# Patient Record
Sex: Female | Born: 1960 | Race: Black or African American | Hispanic: No | Marital: Single | Smoking: Former smoker
Health system: Southern US, Community
[De-identification: ages and names within clinical notes are randomized; demographics above are authoritative.]

## PROBLEM LIST (undated history)

## (undated) DIAGNOSIS — I509 Heart failure, unspecified: Secondary | ICD-10-CM

## (undated) DIAGNOSIS — B1920 Unspecified viral hepatitis C without hepatic coma: Secondary | ICD-10-CM

## (undated) DIAGNOSIS — F329 Major depressive disorder, single episode, unspecified: Secondary | ICD-10-CM

## (undated) DIAGNOSIS — J309 Allergic rhinitis, unspecified: Secondary | ICD-10-CM

## (undated) DIAGNOSIS — F32A Depression, unspecified: Secondary | ICD-10-CM

## (undated) DIAGNOSIS — Z9119 Patient's noncompliance with other medical treatment and regimen: Secondary | ICD-10-CM

## (undated) DIAGNOSIS — F419 Anxiety disorder, unspecified: Secondary | ICD-10-CM

## (undated) DIAGNOSIS — Z91199 Patient's noncompliance with other medical treatment and regimen due to unspecified reason: Secondary | ICD-10-CM

## (undated) DIAGNOSIS — F1910 Other psychoactive substance abuse, uncomplicated: Secondary | ICD-10-CM

## (undated) DIAGNOSIS — R630 Anorexia: Secondary | ICD-10-CM

## (undated) DIAGNOSIS — I10 Essential (primary) hypertension: Secondary | ICD-10-CM

## (undated) DIAGNOSIS — M549 Dorsalgia, unspecified: Secondary | ICD-10-CM

## (undated) DIAGNOSIS — I739 Peripheral vascular disease, unspecified: Secondary | ICD-10-CM

## (undated) DIAGNOSIS — B182 Chronic viral hepatitis C: Secondary | ICD-10-CM

## (undated) DIAGNOSIS — G8929 Other chronic pain: Secondary | ICD-10-CM

## (undated) HISTORY — DX: Anorexia: R63.0

## (undated) HISTORY — DX: Peripheral vascular disease, unspecified: I73.9

## (undated) HISTORY — DX: Heart failure, unspecified: I50.9

## (undated) HISTORY — DX: Other psychoactive substance abuse, uncomplicated: F19.10

## (undated) HISTORY — DX: Essential (primary) hypertension: I10

## (undated) HISTORY — DX: Unspecified viral hepatitis C without hepatic coma: B19.20

## (undated) HISTORY — DX: Major depressive disorder, single episode, unspecified: F32.9

## (undated) HISTORY — DX: Depression, unspecified: F32.A

## (undated) HISTORY — DX: Anxiety disorder, unspecified: F41.9

## (undated) HISTORY — DX: Chronic viral hepatitis C: B18.2

---

## 2000-12-05 ENCOUNTER — Encounter: Payer: Self-pay | Admitting: Internal Medicine

## 2000-12-05 ENCOUNTER — Inpatient Hospital Stay (HOSPITAL_COMMUNITY): Admission: EM | Admit: 2000-12-05 | Discharge: 2000-12-07 | Payer: Self-pay | Admitting: Internal Medicine

## 2002-03-23 HISTORY — PX: APPENDECTOMY: SHX54

## 2003-06-06 ENCOUNTER — Emergency Department (HOSPITAL_COMMUNITY): Admission: EM | Admit: 2003-06-06 | Discharge: 2003-06-06 | Payer: Self-pay | Admitting: Emergency Medicine

## 2003-08-16 ENCOUNTER — Ambulatory Visit (HOSPITAL_COMMUNITY): Admission: RE | Admit: 2003-08-16 | Discharge: 2003-08-16 | Payer: Self-pay | Admitting: Family Medicine

## 2004-01-29 ENCOUNTER — Ambulatory Visit: Payer: Self-pay | Admitting: Family Medicine

## 2004-02-19 ENCOUNTER — Ambulatory Visit: Payer: Self-pay | Admitting: Gastroenterology

## 2004-03-11 ENCOUNTER — Ambulatory Visit: Payer: Self-pay | Admitting: Family Medicine

## 2004-06-17 ENCOUNTER — Ambulatory Visit: Payer: Self-pay | Admitting: Family Medicine

## 2004-08-19 ENCOUNTER — Ambulatory Visit: Payer: Self-pay | Admitting: Family Medicine

## 2004-09-16 ENCOUNTER — Ambulatory Visit: Payer: Self-pay | Admitting: Family Medicine

## 2004-10-24 ENCOUNTER — Ambulatory Visit: Payer: Self-pay | Admitting: Family Medicine

## 2004-12-05 ENCOUNTER — Ambulatory Visit: Payer: Self-pay | Admitting: Family Medicine

## 2005-03-02 ENCOUNTER — Ambulatory Visit: Payer: Self-pay | Admitting: Family Medicine

## 2005-05-05 ENCOUNTER — Ambulatory Visit: Payer: Self-pay | Admitting: Family Medicine

## 2005-06-11 ENCOUNTER — Ambulatory Visit: Payer: Self-pay | Admitting: Family Medicine

## 2005-06-25 ENCOUNTER — Ambulatory Visit: Payer: Self-pay | Admitting: Family Medicine

## 2005-07-16 ENCOUNTER — Ambulatory Visit: Payer: Self-pay | Admitting: Family Medicine

## 2005-09-01 ENCOUNTER — Ambulatory Visit: Payer: Self-pay | Admitting: Family Medicine

## 2005-09-03 ENCOUNTER — Ambulatory Visit (HOSPITAL_COMMUNITY): Admission: RE | Admit: 2005-09-03 | Discharge: 2005-09-03 | Payer: Self-pay | Admitting: Family Medicine

## 2005-10-13 ENCOUNTER — Ambulatory Visit: Payer: Self-pay | Admitting: Family Medicine

## 2005-12-11 ENCOUNTER — Ambulatory Visit: Payer: Self-pay | Admitting: Family Medicine

## 2005-12-11 ENCOUNTER — Other Ambulatory Visit: Admission: RE | Admit: 2005-12-11 | Discharge: 2005-12-11 | Payer: Self-pay | Admitting: Family Medicine

## 2005-12-11 ENCOUNTER — Encounter: Payer: Self-pay | Admitting: Family Medicine

## 2005-12-14 ENCOUNTER — Ambulatory Visit (HOSPITAL_COMMUNITY): Admission: RE | Admit: 2005-12-14 | Discharge: 2005-12-14 | Payer: Self-pay | Admitting: Family Medicine

## 2005-12-28 ENCOUNTER — Ambulatory Visit: Payer: Self-pay | Admitting: Family Medicine

## 2006-03-11 ENCOUNTER — Ambulatory Visit: Payer: Self-pay | Admitting: Family Medicine

## 2006-03-25 ENCOUNTER — Ambulatory Visit (HOSPITAL_COMMUNITY): Admission: RE | Admit: 2006-03-25 | Discharge: 2006-03-25 | Payer: Self-pay | Admitting: Family Medicine

## 2006-04-21 ENCOUNTER — Ambulatory Visit: Payer: Self-pay | Admitting: Family Medicine

## 2006-06-02 ENCOUNTER — Ambulatory Visit: Payer: Self-pay | Admitting: Family Medicine

## 2006-08-02 ENCOUNTER — Ambulatory Visit: Payer: Self-pay | Admitting: Family Medicine

## 2006-08-02 LAB — CONVERTED CEMR LAB
BUN: 14 mg/dL (ref 6–23)
Calcium: 9.3 mg/dL (ref 8.4–10.5)
Chloride: 106 meq/L (ref 96–112)
Creatinine, Ser: 0.9 mg/dL (ref 0.40–1.20)
HDL: 51 mg/dL (ref >39)
Hgb A1c MFr Bld: 6.4 % — ABNORMAL HIGH (ref 4.6–6.1)
LDL Cholesterol: 28 mg/dL (ref 0–99)
Triglycerides: 212 mg/dL — ABNORMAL HIGH (ref <150)

## 2006-09-09 ENCOUNTER — Ambulatory Visit (HOSPITAL_COMMUNITY): Admission: RE | Admit: 2006-09-09 | Discharge: 2006-09-09 | Payer: Self-pay | Admitting: Family Medicine

## 2006-09-27 ENCOUNTER — Ambulatory Visit: Payer: Self-pay | Admitting: Family Medicine

## 2006-11-29 ENCOUNTER — Encounter: Payer: Self-pay | Admitting: Family Medicine

## 2006-11-29 ENCOUNTER — Other Ambulatory Visit: Admission: RE | Admit: 2006-11-29 | Discharge: 2006-11-29 | Payer: Self-pay | Admitting: Family Medicine

## 2006-11-29 ENCOUNTER — Ambulatory Visit: Payer: Self-pay | Admitting: Family Medicine

## 2006-11-29 LAB — CONVERTED CEMR LAB: Pap Smear: NORMAL

## 2006-11-30 ENCOUNTER — Encounter: Payer: Self-pay | Admitting: Family Medicine

## 2006-11-30 LAB — CONVERTED CEMR LAB
Chlamydia, DNA Probe: NEGATIVE
Gardnerella vaginalis: NEGATIVE
Trichomonal Vaginitis: NEGATIVE

## 2006-12-10 ENCOUNTER — Ambulatory Visit: Payer: Self-pay | Admitting: Family Medicine

## 2007-03-01 ENCOUNTER — Ambulatory Visit: Payer: Self-pay | Admitting: Family Medicine

## 2007-04-14 ENCOUNTER — Ambulatory Visit: Payer: Self-pay | Admitting: Family Medicine

## 2007-06-02 ENCOUNTER — Encounter: Payer: Self-pay | Admitting: Family Medicine

## 2007-06-02 DIAGNOSIS — E119 Type 2 diabetes mellitus without complications: Secondary | ICD-10-CM

## 2007-06-02 DIAGNOSIS — B1710 Acute hepatitis C without hepatic coma: Secondary | ICD-10-CM | POA: Insufficient documentation

## 2007-06-02 DIAGNOSIS — Z9189 Other specified personal risk factors, not elsewhere classified: Secondary | ICD-10-CM | POA: Insufficient documentation

## 2007-06-02 DIAGNOSIS — E1165 Type 2 diabetes mellitus with hyperglycemia: Secondary | ICD-10-CM | POA: Insufficient documentation

## 2007-06-02 DIAGNOSIS — F411 Generalized anxiety disorder: Secondary | ICD-10-CM | POA: Insufficient documentation

## 2007-06-02 DIAGNOSIS — F329 Major depressive disorder, single episode, unspecified: Secondary | ICD-10-CM | POA: Insufficient documentation

## 2007-06-02 DIAGNOSIS — F1010 Alcohol abuse, uncomplicated: Secondary | ICD-10-CM | POA: Insufficient documentation

## 2007-06-10 ENCOUNTER — Ambulatory Visit: Payer: Self-pay | Admitting: Family Medicine

## 2007-06-10 LAB — CONVERTED CEMR LAB
Basophils Absolute: 0 10*3/uL (ref 0.0–0.1)
Basophils Relative: 1 % (ref 0–1)
Calcium: 8.9 mg/dL (ref 8.4–10.5)
Eosinophils Absolute: 0.1 10*3/uL (ref 0.0–0.7)
Eosinophils Relative: 3 % (ref 0–5)
HCT: 40.7 % (ref 36.0–46.0)
MCV: 96 fL (ref 78.0–100.0)
Neutrophils Relative %: 30 % — ABNORMAL LOW (ref 43–77)
Platelets: 143 10*3/uL — ABNORMAL LOW (ref 150–400)
Potassium: 4.6 meq/L (ref 3.5–5.3)
RDW: 12.3 % (ref 11.5–15.5)
Sodium: 139 meq/L (ref 135–145)
WBC: 3.4 10*3/uL — ABNORMAL LOW (ref 4.0–10.5)

## 2007-09-16 ENCOUNTER — Ambulatory Visit: Payer: Self-pay | Admitting: Family Medicine

## 2007-09-16 LAB — CONVERTED CEMR LAB
BUN: 12 mg/dL (ref 6–23)
CO2: 25 meq/L (ref 19–32)
Calcium: 8.6 mg/dL (ref 8.4–10.5)
Eosinophils Relative: 2 % (ref 0–5)
Glucose, Bld: 134 mg/dL — ABNORMAL HIGH (ref 70–99)
HCT: 44.1 % (ref 36.0–46.0)
Hemoglobin: 14.3 g/dL (ref 12.0–15.0)
Lymphocytes Relative: 61 % — ABNORMAL HIGH (ref 12–46)
Lymphs Abs: 2 10*3/uL (ref 0.7–4.0)
MCV: 96.5 fL (ref 78.0–100.0)
Microalb, Ur: 0.3 mg/dL (ref 0.00–1.89)
Monocytes Absolute: 0.3 10*3/uL (ref 0.1–1.0)
Monocytes Relative: 9 % (ref 3–12)
Platelets: 138 10*3/uL — ABNORMAL LOW (ref 150–400)
RBC: 4.57 M/uL (ref 3.87–5.11)
Sodium: 141 meq/L (ref 135–145)
TSH: 1.172 microintl units/mL (ref 0.350–5.50)
Total CHOL/HDL Ratio: 2.3
WBC: 3.3 10*3/uL — ABNORMAL LOW (ref 4.0–10.5)

## 2007-09-22 ENCOUNTER — Ambulatory Visit (HOSPITAL_COMMUNITY): Admission: RE | Admit: 2007-09-22 | Discharge: 2007-09-22 | Payer: Self-pay | Admitting: Family Medicine

## 2008-01-04 ENCOUNTER — Other Ambulatory Visit: Admission: RE | Admit: 2008-01-04 | Discharge: 2008-01-04 | Payer: Self-pay | Admitting: Family Medicine

## 2008-01-04 ENCOUNTER — Ambulatory Visit: Payer: Self-pay | Admitting: Family Medicine

## 2008-01-04 ENCOUNTER — Encounter: Payer: Self-pay | Admitting: Family Medicine

## 2008-01-04 LAB — CONVERTED CEMR LAB: Hgb A1c MFr Bld: 6.3 %

## 2008-04-04 ENCOUNTER — Ambulatory Visit: Payer: Self-pay | Admitting: Family Medicine

## 2008-04-04 LAB — CONVERTED CEMR LAB: Glucose, Bld: 161 mg/dL

## 2008-05-24 ENCOUNTER — Encounter: Payer: Self-pay | Admitting: Family Medicine

## 2008-06-08 ENCOUNTER — Encounter: Payer: Self-pay | Admitting: Family Medicine

## 2008-06-19 ENCOUNTER — Telehealth: Payer: Self-pay | Admitting: Family Medicine

## 2008-07-16 ENCOUNTER — Encounter: Payer: Self-pay | Admitting: Family Medicine

## 2008-07-20 ENCOUNTER — Ambulatory Visit: Payer: Self-pay | Admitting: Family Medicine

## 2008-07-20 LAB — CONVERTED CEMR LAB
CO2: 24 meq/L (ref 19–32)
Calcium: 9 mg/dL (ref 8.4–10.5)
Glucose, Bld: 196 mg/dL — ABNORMAL HIGH (ref 70–99)
Potassium: 4.6 meq/L (ref 3.5–5.3)
Sodium: 138 meq/L (ref 135–145)

## 2008-07-23 ENCOUNTER — Telehealth: Payer: Self-pay | Admitting: Family Medicine

## 2008-07-24 ENCOUNTER — Encounter: Payer: Self-pay | Admitting: Family Medicine

## 2008-07-31 ENCOUNTER — Encounter: Payer: Self-pay | Admitting: Family Medicine

## 2008-09-06 ENCOUNTER — Ambulatory Visit: Payer: Self-pay | Admitting: Family Medicine

## 2008-09-07 ENCOUNTER — Encounter: Payer: Self-pay | Admitting: Family Medicine

## 2008-09-14 ENCOUNTER — Encounter: Payer: Self-pay | Admitting: Family Medicine

## 2008-09-14 LAB — CONVERTED CEMR LAB
ALT: 69 units/L — ABNORMAL HIGH (ref 0–35)
AST: 53 units/L — ABNORMAL HIGH (ref 0–37)
BUN: 12 mg/dL (ref 6–23)
Basophils Absolute: 0 10*3/uL (ref 0.0–0.1)
Basophils Relative: 0 % (ref 0–1)
Bilirubin, Direct: 0.4 mg/dL — ABNORMAL HIGH (ref 0.0–0.3)
Calcium: 9.1 mg/dL (ref 8.4–10.5)
Cholesterol: 95 mg/dL (ref 0–200)
Eosinophils Relative: 2 % (ref 0–5)
Glucose, Bld: 152 mg/dL — ABNORMAL HIGH (ref 70–99)
HCT: 40.1 % (ref 36.0–46.0)
Hemoglobin: 13.9 g/dL (ref 12.0–15.0)
Indirect Bilirubin: 0.5 mg/dL (ref 0.0–0.9)
MCHC: 34.7 g/dL (ref 30.0–36.0)
Monocytes Absolute: 0.5 10*3/uL (ref 0.1–1.0)
RDW: 12.6 % (ref 11.5–15.5)
VLDL: 35 mg/dL (ref 0–40)

## 2008-10-02 ENCOUNTER — Ambulatory Visit (HOSPITAL_COMMUNITY): Admission: RE | Admit: 2008-10-02 | Discharge: 2008-10-02 | Payer: Self-pay | Admitting: Family Medicine

## 2008-11-12 ENCOUNTER — Ambulatory Visit: Payer: Self-pay | Admitting: Family Medicine

## 2008-11-12 LAB — CONVERTED CEMR LAB: Glucose, Bld: 378 mg/dL

## 2008-11-16 ENCOUNTER — Telehealth: Payer: Self-pay | Admitting: Family Medicine

## 2008-11-16 ENCOUNTER — Emergency Department (HOSPITAL_COMMUNITY): Admission: EM | Admit: 2008-11-16 | Discharge: 2008-11-16 | Payer: Self-pay | Admitting: Emergency Medicine

## 2008-11-19 ENCOUNTER — Ambulatory Visit: Payer: Self-pay | Admitting: Family Medicine

## 2008-11-19 LAB — CONVERTED CEMR LAB: Blood Glucose, Fasting: 312 mg/dL

## 2008-12-04 ENCOUNTER — Ambulatory Visit: Payer: Self-pay | Admitting: Family Medicine

## 2008-12-05 ENCOUNTER — Telehealth: Payer: Self-pay | Admitting: Family Medicine

## 2008-12-05 ENCOUNTER — Encounter: Payer: Self-pay | Admitting: Family Medicine

## 2008-12-05 LAB — CONVERTED CEMR LAB: Creatinine, Urine: 176.7 mg/dL

## 2009-01-08 ENCOUNTER — Ambulatory Visit: Payer: Self-pay | Admitting: Family Medicine

## 2009-02-07 ENCOUNTER — Ambulatory Visit: Payer: Self-pay | Admitting: Family Medicine

## 2009-02-07 LAB — CONVERTED CEMR LAB: Hgb A1c MFr Bld: 7.5 %

## 2009-03-12 ENCOUNTER — Other Ambulatory Visit: Admission: RE | Admit: 2009-03-12 | Discharge: 2009-03-12 | Payer: Self-pay | Admitting: Family Medicine

## 2009-03-12 ENCOUNTER — Ambulatory Visit: Payer: Self-pay | Admitting: Family Medicine

## 2009-03-12 LAB — CONVERTED CEMR LAB: OCCULT 1: NEGATIVE

## 2009-03-13 ENCOUNTER — Encounter: Payer: Self-pay | Admitting: Family Medicine

## 2009-03-13 LAB — CONVERTED CEMR LAB
Chlamydia, DNA Probe: NEGATIVE
GC Probe Amp, Genital: NEGATIVE

## 2009-04-29 ENCOUNTER — Ambulatory Visit: Payer: Self-pay | Admitting: Family Medicine

## 2009-04-29 LAB — CONVERTED CEMR LAB: Blood Glucose, Fasting: 234 mg/dL

## 2009-08-06 ENCOUNTER — Ambulatory Visit: Payer: Self-pay | Admitting: Family Medicine

## 2009-08-09 LAB — CONVERTED CEMR LAB
BUN: 11 mg/dL (ref 6–23)
CO2: 24 meq/L (ref 19–32)
Chloride: 106 meq/L (ref 96–112)
Glucose, Bld: 133 mg/dL — ABNORMAL HIGH (ref 70–99)
Hgb A1c MFr Bld: 6.8 % — ABNORMAL HIGH (ref <5.7)
Potassium: 4.2 meq/L (ref 3.5–5.3)

## 2009-08-19 DIAGNOSIS — F5105 Insomnia due to other mental disorder: Secondary | ICD-10-CM

## 2009-08-19 DIAGNOSIS — F419 Anxiety disorder, unspecified: Secondary | ICD-10-CM | POA: Insufficient documentation

## 2009-10-14 ENCOUNTER — Ambulatory Visit (HOSPITAL_COMMUNITY): Admission: RE | Admit: 2009-10-14 | Discharge: 2009-10-14 | Payer: Self-pay | Admitting: Family Medicine

## 2009-10-29 ENCOUNTER — Encounter: Payer: Self-pay | Admitting: Family Medicine

## 2009-11-04 ENCOUNTER — Telehealth: Payer: Self-pay | Admitting: Family Medicine

## 2009-11-06 ENCOUNTER — Ambulatory Visit: Payer: Self-pay | Admitting: Family Medicine

## 2009-11-06 DIAGNOSIS — R5383 Other fatigue: Secondary | ICD-10-CM

## 2009-11-06 DIAGNOSIS — R5381 Other malaise: Secondary | ICD-10-CM | POA: Insufficient documentation

## 2010-01-03 ENCOUNTER — Encounter: Payer: Self-pay | Admitting: Family Medicine

## 2010-01-21 ENCOUNTER — Telehealth: Payer: Self-pay | Admitting: Family Medicine

## 2010-02-03 ENCOUNTER — Encounter: Payer: Self-pay | Admitting: Family Medicine

## 2010-03-03 ENCOUNTER — Encounter: Payer: Self-pay | Admitting: Family Medicine

## 2010-03-04 LAB — CONVERTED CEMR LAB
ALT: 111 units/L — ABNORMAL HIGH (ref 0–35)
AST: 131 units/L — ABNORMAL HIGH (ref 0–37)
Albumin: 4 g/dL (ref 3.5–5.2)
Alkaline Phosphatase: 107 units/L (ref 39–117)
BUN: 11 mg/dL (ref 6–23)
Basophils Absolute: 0.1 10*3/uL (ref 0.0–0.1)
Basophils Relative: 2 % — ABNORMAL HIGH (ref 0–1)
Bilirubin, Direct: 0.4 mg/dL — ABNORMAL HIGH (ref 0.0–0.3)
CO2: 26 meq/L (ref 19–32)
Calcium: 9.5 mg/dL (ref 8.4–10.5)
Chloride: 103 meq/L (ref 96–112)
Cholesterol: 92 mg/dL (ref 0–200)
Creatinine, Ser: 0.73 mg/dL (ref 0.40–1.20)
Eosinophils Absolute: 0.1 10*3/uL (ref 0.0–0.7)
Eosinophils Relative: 1 % (ref 0–5)
Glucose, Bld: 210 mg/dL — ABNORMAL HIGH (ref 70–99)
HCT: 41 % (ref 36.0–46.0)
HDL: 35 mg/dL — ABNORMAL LOW (ref >39)
Hemoglobin: 14.2 g/dL (ref 12.0–15.0)
Hgb A1c MFr Bld: 8.9 % — ABNORMAL HIGH (ref <5.7)
Indirect Bilirubin: 0.5 mg/dL (ref 0.0–0.9)
LDL Cholesterol: 25 mg/dL (ref 0–99)
Lymphocytes Relative: 56 % — ABNORMAL HIGH (ref 12–46)
Lymphs Abs: 2.9 10*3/uL (ref 0.7–4.0)
MCHC: 34.6 g/dL (ref 30.0–36.0)
MCV: 92.1 fL (ref 78.0–100.0)
Monocytes Absolute: 0.6 10*3/uL (ref 0.1–1.0)
Monocytes Relative: 12 % (ref 3–12)
Neutro Abs: 1.5 10*3/uL — ABNORMAL LOW (ref 1.7–7.7)
Neutrophils Relative %: 29 % — ABNORMAL LOW (ref 43–77)
Platelets: 121 10*3/uL — ABNORMAL LOW (ref 150–400)
Potassium: 4.3 meq/L (ref 3.5–5.3)
RBC: 4.45 M/uL (ref 3.87–5.11)
RDW: 11.7 % (ref 11.5–15.5)
Sodium: 139 meq/L (ref 135–145)
Total Bilirubin: 0.9 mg/dL (ref 0.3–1.2)
Total CHOL/HDL Ratio: 2.6
Total Protein: 7.5 g/dL (ref 6.0–8.3)
Triglycerides: 162 mg/dL — ABNORMAL HIGH (ref <150)
VLDL: 32 mg/dL (ref 0–40)
WBC: 5.2 10*3/uL (ref 4.0–10.5)

## 2010-03-19 ENCOUNTER — Ambulatory Visit: Payer: Self-pay | Admitting: Family Medicine

## 2010-03-22 DIAGNOSIS — F1920 Other psychoactive substance dependence, uncomplicated: Secondary | ICD-10-CM | POA: Insufficient documentation

## 2010-03-26 ENCOUNTER — Encounter: Payer: Self-pay | Admitting: Family Medicine

## 2010-04-13 ENCOUNTER — Encounter: Payer: Self-pay | Admitting: Family Medicine

## 2010-04-14 ENCOUNTER — Encounter: Payer: Self-pay | Admitting: Family Medicine

## 2010-04-22 NOTE — Letter (Signed)
Summary: ccme  ccme   Imported By: Dierdre Harness 02/03/2010 10:47:36  _____________________________________________________________________  External Attachment:    Type:   Image     Comment:   External Document

## 2010-04-22 NOTE — Letter (Signed)
Summary: MEDICAL NECESSITY  MEDICAL NECESSITY   Imported By: Dierdre Harness 08/06/2009 09:59:49  _____________________________________________________________________  External Attachment:    Type:   Image     Comment:   External Document

## 2010-04-22 NOTE — Assessment & Plan Note (Signed)
Summary: office visit   Vital Signs:  Patient profile:   50 year old female Menstrual status:  irregular Height:      67 inches Weight:      149 pounds BMI:     23.42 O2 Sat:      97 % Pulse rate:   82 / minute Resp:     16 per minute BP sitting:   120 / 90  (left arm) Cuff size:   regular  Vitals Entered By: Kate Sable LPN (August 17, 624THL 9:57 AM) CC: Follow up chronic problems, has some dry itchy places on her arms   Primary Care Provider:  Tula Nakayama MD  CC:  Follow up chronic problems and has some dry itchy places on her arms.  History of Present Illness: Reports  that she has not been doing well. She is experiencing increased depression, states she "misses her twin". Reports since twin has a new boyfriend she is not as involved with her and this is depressing, crying and tearful Denies recent fever or chills. Denies sinus pressure, nasal congestion , ear pain or sore throat. Denies chest congestion, or cough productive of sputum. Denies chest pain, palpitations, PND, orthopnea or leg swelling. Denies abdominal pain, nausea, vomitting, diarrhea or constipation. Denies change in bowel movements or bloody stool. Denies dysuria , frequency, incontinence or hesitancy. Denies  joint pain, swelling, or reduced mobility. Denies headaches, vertigo, seizures.    Allergies (verified): 1)  ! Pcn  Review of Systems Derm:  Complains of itching and rash; dry scaly areas on arms x 1 week, no eryuthema , warmth or drainage. Psych:  Complains of depression, easily tearful, irritability, and mental problems; denies suicidal thoughts/plans, thoughts of violence, and unusual visions or sounds. Endo:  Denies excessive thirst and excessive urination; blood sugars still uncontrolled, wth fasdtings averaging over 140. Heme:  Denies abnormal bruising and bleeding.  Physical Exam  General:  Well-developed,well-nourished,in no acute distress; alert,appropriate and cooperative  throughout examination HEENT: No facial asymmetry,  EOMI, No sinus tenderness, TM's Clear, oropharynx  pink and moist.   Chest: Clear to auscultation bilaterally.  CVS: S1, S2, No murmurs, No S3.   Abd: Soft, Nontender.  MS: Adequate ROM spine, hips, shoulders and knees.  Ext: No edema.   CNS: CN 2-12 intact, power tone and sensation normal throughout.   Skin: Intact, no visible lesions or rashes.  Psych: Good eye contact,flat affect.  Memory intact, depressed and rtearful   Impression & Recommendations:  Problem # 1:  HYPERTENSION (ICD-401.9) Assessment Deteriorated  BP today: 120/90 Prior BP: 118/80 (08/06/2009)  Labs Reviewed: K+: 4.2 (08/09/2009) Creat: : 0.82 (08/09/2009)   Chol: 95 (09/14/2008)   HDL: 35 (09/14/2008)   LDL: 25 (09/14/2008)   TG: 173 (09/14/2008)  Problem # 2:  ANXIETY (ICD-300.00) Assessment: Deteriorated  The following medications were removed from the medication list:    Hydroxyzine Hcl 25 Mg Tabs (Hydroxyzine hcl) .[name]... Take 1 tab by mouth at bedtime Her updated medication list for this problem includes:    Fluoxetine Hcl 10 Mg Caps (Fluoxetine hcl) .[name]... Take 1 capsule by mouth once a day  Problem # 3:  DEPRESSION (ICD-311) Assessment: Deteriorated  The following medications were removed from the medication list:    Hydroxyzine Hcl 25 Mg Tabs (Hydroxyzine hcl) .[name]... Take 1 tab by mouth at bedtime Her updated medication list for this problem includes:    Fluoxetine Hcl 10 Mg Caps (Fluoxetine hcl) .[name]... Take 1 capsule by mouth once  a day  Problem # 4:  DIABETES MELLITUS, TYPE II (ICD-250.00) Assessment: Comment Only  Her updated medication list for this problem includes:    Riomet 500 Mg/37ml Soln (Metformin hcl) .[name]... 2 teaspoons twice daily    Glipizide 10 Mg Tabs (Glipizide) .[name]... Take 1 tablet by mouth two times a day    Lantus Solostar 100 Unit/ml Soln (Insulin glargine) .[name]... 25 units evry evening  Orders: T- Hemoglobin A1C  [id number])  Labs Reviewed: Creat: 0.82 (08/09/2009)     Last Eye Exam: normal (03/12/2009) Reviewed HgBA1c results: 6.8 (08/09/2009)  6.3 (04/29/2009)  Complete Medication List: 1)  Prodigy Meter  .... Once daily testing and supplies 2)  Riomet 500 Mg/35ml Soln (Metformin hcl) .... 2 teaspoons twice daily 3)  Glipizide 10 Mg Tabs (Glipizide) .... Take 1 tablet by mouth two times a day 4)  Lantus Solostar 100 Unit/ml Soln (Insulin glargine) .... 25 units evry evening 5)  Clotrimazole 1 % Crea (Clotrimazole) .... Apply twice daily to rash for 3 weeks then as needed 6)  Prodigy Blood Glucose Test Strp (Glucose blood) .... Once daily testing 7)  Fluoxetine Hcl 10 Mg Caps (Fluoxetine hcl) .... Take 1 capsule by mouth once a day  Other Orders: T-Basic Metabolic Panel (99991111) T-CBC w/Diff [phone]) T-TSH [phone])  Patient Instructions: 1)  CPE 12/22 or after. 2)  BMP prior to visit, ICD-9: 3)  TSH prior to visit, ICD-9:  today 4)  CBC w/ Diff prior to visit, ICD-9: 5)  HbgA1C prior to visit, ICD-9: Prescriptions: FLUOXETINE HCL 10 MG CAPS (FLUOXETINE HCL) Take 1 capsule by mouth once a day  #30 x 2   Entered and Authorized by:   Tula Nakayama MD   Signed by:   Tula Nakayama MD on 11/06/2009   Method used:   Electronically to        [facility] (retail)       [place] [address] [place], [place]  [address]       Ph: [phone]       Fax: [phone]   RxID:   [id number]

## 2010-04-22 NOTE — Miscellaneous (Signed)
Summary: CCME  CCME   Imported By: Dierdre Harness 10/30/2009 10:36:33  _____________________________________________________________________  External Attachment:    Type:   Image     Comment:   External Document

## 2010-04-22 NOTE — Assessment & Plan Note (Signed)
Summary: office visit   Vital Signs:  Patient profile:   50 year old female Menstrual status:  irregular Height:      67 inches Weight:      154 pounds BMI:     24.21 O2 Sat:      96 % Pulse rate:   91 / minute Pulse rhythm:   regular Resp:     16 per minute BP sitting:   118 / 80  (left arm) Cuff size:   regular  Vitals Entered By: Kate Sable LPN (May 17, 624THL [id number] AM) CC: Follow up chronic problems, can't sleep at night, needs some nerve pills Comments Only takes her lantus when her sugar is high in the mornings   Primary Care Provider:  Tula Nakayama MD  CC:  Follow up chronic problems, can't sleep at night, and needs some nerve pills.  History of Present Illness: Reports  that she is doing fairly well. Denies recent fever or chills. Denies sinus pressure, nasal congestion , ear pain or sore throat. Denies chest congestion, or cough productive of sputum. Denies chest pain, palpitations, PND, orthopnea or leg swelling. Denies abdominal pain, nausea, vomitting, diarrhea or constipation. Denies change in bowel movements or bloody stool. Denies dysuria , frequency, incontinence or hesitancy. Denies  joint pain, swelling, or reduced mobility. Denies headaches, vertigo, seizures. Denies depression,she reports  anxiety and  insomnia.She is not going to mental health as she should, and i advise herof the need to resume this Denies  rash, lesions, or itch.     Current Medications (verified): 1)  Prodigy Meter .... Once Daily Testing and Supplies 2)  Riomet 500 Mg/60ml Soln (Metformin Hcl) .... 2 Teaspoons Twice Daily 3)  Glipizide 10 Mg Tabs (Glipizide) .... Take 1 Tablet By Mouth Two Times A Day 4)  Lantus Solostar 100 Unit/ml Soln (Insulin Glargine) .... 25 Units Evry Evening 5)  Clotrimazole 1 % Crea (Clotrimazole) .... Apply Twice Daily To Rash For 3 Weeks Then As Needed  Allergies (verified): 1)  ! Pcn  Review of Systems      See HPI General:  Complains of  sleep disorder. Eyes:  Denies discharge and red eye. Psych:  Complains of anxiety and mental problems; continued drug abuse including cocaine. Endo:  Denies cold intolerance, excessive hunger, excessive thirst, excessive urination, heat intolerance, polyuria, and weight change; log book shows fasting sugars averaging less than 130. Heme:  Denies abnormal bruising and bleeding. Allergy:  Denies seasonal allergies.  Physical Exam  General:  Well-developed,well-nourished,in no acute distress; alert,appropriate and cooperative throughout examination HEENT: No facial asymmetry,  EOMI, No sinus tenderness, TM's Clear, oropharynx  pink and moist.   Chest: Clear to auscultation bilaterally.  CVS: S1, S2, No murmurs, No S3.   Abd: Soft, Nontender.  MS: Adequate ROM spine, hips, shoulders and knees.  Ext: No edema.   CNS: CN 2-12 intact, power tone and sensation normal throughout.   Skin: Intact, no visible lesions or rashes.  Psych: Good eye contact, normal affect.  Memory intact, not anxious or depressed appearing.She appears to be underthe influenceof illicit drugs again, not as "high" as previous visit    Impression & Recommendations:  Problem # 1:  HYPERTENSION (ICD-401.9) Assessment Unchanged  The following medications were removed from the medication list:    Benazepril Hcl 5 Mg Tabs (Benazepril hcl) .[name]... 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 Take 1 tab by mouth at bedtime  Problem # 4:  DIABETES MELLITUS, TYPE II (ICD-250.00) Assessment: Comment Only  The following medications were removed from the medication list:    Benazepril Hcl 5 Mg Tabs (Benazepril hcl) .[name]... Take 1 tablet by mouth once a day Her updated medication list for this problem includes:    Riomet 500 Mg/67ml Soln (Metformin hcl) .[name]... 2 teaspoons twice daily    Glipizide 10 Mg Tabs (Glipizide) .[name]... Take 1 tablet by mouth two times a day    Lantus Solostar 100 Unit/ml Soln (Insulin glargine) .[name]... 25 units evry evening  Orders: Glucose, (CBG) (82962) T- Hemoglobin A1C [phone])  Labs Reviewed: Creat: 0.77 (09/14/2008)     Last Eye Exam: normal (03/12/2009) Reviewed HgBA1c results: 6.3 (04/29/2009)  7.5 (02/07/2009)  Problem # 5:  INSOMNIA (ICD-780.52) Assessment: Deteriorated  Discussed sleep hygiene. Start hydroxyzine at bedtime  Complete Medication List: 1)  Prodigy Meter  .... Once daily testing and supplies 2)  Riomet 500 Mg/52ml Soln (Metformin hcl) .... 2 teaspoons twice daily 3)  Glipizide 10 Mg Tabs (Glipizide) .... Take 1 tablet by mouth two times a day 4)  Lantus Solostar 100 Unit/ml Soln (Insulin glargine) .... 25 units evry evening 5)  Clotrimazole 1 % Crea (Clotrimazole) .... Apply twice daily to rash for 3 weeks then as needed 6)  Hydroxyzine Hcl 25 Mg Tabs (Hydroxyzine hcl) .... Take 1 tab by mouth at bedtime  Patient Instructions: 1)  Please schedule a follow-up appointment in 3 months. 2)  BMP prior to visit, ICD-9: 3)  HbgA1C prior to visit, ICD-9:  today 4)  no med changes at this time, however new med for anxiety and sleep Prescriptions: GLIPIZIDE 10 MG TABS (GLIPIZIDE) Take 1 tablet by mouth two times a day  #60 x 3   Entered by:   Kate Sable LPN   Authorized by:   Tula Nakayama MD   Signed by:   Kate Sable LPN on [id number]   Method used:   Electronically to         [facility] (retail)       [place] [address]       [place], [place]  [address]       Ph: [phone]       Fax: [phone]   RxIDBL:[id number] HYDROXYZINE HCL 25 MG TABS (HYDROXYZINE HCL) Take 1 tab by mouth at bedtime  #30 x 3   Entered and Authorized by:   Tula Nakayama MD   Signed by:   Tula Nakayama MD on 08/06/2009   Method used:   Electronically to        [facility] (retail)       [place] [address]       [place], [place]  [address]       Ph: [phone]       Fax: [phone]  RxIDOM:[id number]   Laboratory Results   Blood Tests     Glucose (fasting): 137 mg/dL   (Normal Range: 70-105)

## 2010-04-22 NOTE — Progress Notes (Signed)
Summary: REF TO CCME  Phone Note Call from Patient   Summary of Call: NEEDS ANOTHER REF SENT TO CCME Initial call taken by: Dierdre Harness,  January 21, 2010 10:10 AM  Follow-up for Phone Call        form completed Follow-up by: Baldomero Lamy LPN,  November  7, 624THL 3:15 PM

## 2010-04-22 NOTE — Progress Notes (Signed)
Summary: nurse  Phone Note Call from Patient   Summary of Call: needs to speak with nurse about paper work. [phone] wants faxed somewhere else.  Initial call taken by: Lenn Cal,  November 04, 2009 2:29 PM  Follow-up for Phone Call        returned call, left message Follow-up by: Baldomero Lamy LPN,  August 16, 624THL 11:04 AM  Additional Follow-up for Phone Call Additional follow up Details #1::        will fax to [facility] as requested by caretaker Additional Follow-up by: Baldomero Lamy LPN,  August 16, 624THL 11:28 AM

## 2010-04-22 NOTE — Assessment & Plan Note (Signed)
Summary: F UP   Vital Signs:  Patient profile:   50 year old female Menstrual status:  irregular Weight:      155 pounds O2 Sat:      94 % on Room air Pulse rate:   80 / minute Pulse rhythm:   regular Resp:     16 per minute BP sitting:   110 / 809  (left arm)  O2 Flow:  Room air  Primary Care Provider:  Tula Nakayama MD   History of Present Illness: Reports  thatshe hasw been doing well. Denies recent fever or chills. Denies sinus pressure, nasal congestion , ear pain or sore throat. Denies chest congestion, or cough productive of sputum. Denies chest pain, palpitations, PND, orthopnea or leg swelling. Denies abdominal pain, nausea, vomitting, diarrhea or constipation. Denies change in bowel movements or bloody stool. Denies dysuria , frequency, incontinence or hesitancy. Denies  joint pain, swelling, or reduced mobility. Denies headaches, vertigo, seizures. Denies depression, anxiety or insomnia. Denies  rash, lesions, or itch.     Preventive Screening-Counseling & Management  Alcohol-Tobacco     Smoking Cessation Counseling: yes  Allergies: 1)  ! Pcn  Review of Systems      See HPI Eyes:  Denies blurring and discharge. Psych:  Complains of anxiety, depression, and mental problems; denies suicidal thoughts/plans, thoughts of violence, and unusual visions or sounds; still using cocaine and smoking. Endo:  Denies cold intolerance, excessive hunger, excessive thirst, excessive urination, heat intolerance, polyuria, and weight change. Heme:  Denies abnormal bruising and bleeding. Allergy:  Complains of seasonal allergies; denies hives or rash.  Physical Exam  General:  Well-developed,well-nourished,in no acute distress; alertpt appears to be "high" on drugs at this visit HEENT: No facial asymmetry,  EOMI, No sinus tenderness, TM's Clear, oropharynx  pink and moist.   Chest: Clear to auscultation bilaterally.  CVS: S1, S2, No murmurs, No S3.   Abd: Soft,  Nontender.  MS: Adequate ROM spine, hips, shoulders and knees.  Ext: No edema.   CNS: CN 2-12 intact, power tone and sensation normal throughout.   Skin: Intact, no visible lesions or rashes.  Psych: Good eye contact, normal affect.  Memory intact, not anxious or depressed appearing.    Impression & Recommendations:  Problem # 1:  HYPERTENSION (ICD-401.9) Assessment Unchanged  Her updated medication list for this problem includes:    Benazepril Hcl 5 Mg Tabs (Benazepril hcl) .[name]... Take 1 tablet by mouth once a day  BP today: 110/809 Prior BP: 100/70 (03/12/2009)  Labs Reviewed: K+: 4.4 (09/14/2008) Creat: : 0.77 (09/14/2008)   Chol: 95 (09/14/2008)   HDL: 35 (09/14/2008)   LDL: 25 (09/14/2008)   TG: 173 (09/14/2008)  Problem # 2:  ALCOHOL USE (ICD-305.00) Assessment: Unchanged counbselled of the need to doscontinuie use  Problem # 3:  DIABETES MELLITUS, TYPE II (ICD-250.00) Assessment: Improved  Her updated medication list for this problem includes:    Riomet 500 Mg/23ml Soln (Metformin hcl) .[name]... 2 teaspoons twice daily    Glipizide 10 Mg Tabs (Glipizide) .[name]... Take 1 tablet by mouth two times a day    Lantus Solostar 100 Unit/ml Soln (Insulin glargine) .[name]... 25 units evry evening    Benazepril Hcl 5 Mg Tabs (Benazepril hcl) .[name]... Take 1 tablet by mouth once a day  Labs Reviewed: Creat: 0.77 (09/14/2008)     Last Eye Exam: normal (03/12/2009) Reviewed HgBA1c results: 6.3 (04/29/2009)  7.5 (02/07/2009)  Problem # 4:  ANXIETY (ICD-300.00)  Complete Medication  List: 1)  Prodigy Meter  .... Once daily testing and supplies 2)  Riomet 500 Mg/12ml Soln (Metformin hcl) .... 2 teaspoons twice daily 3)  Glipizide 10 Mg Tabs (Glipizide) .... Take 1 tablet by mouth two times a day 4)  Lantus Solostar 100 Unit/ml Soln (Insulin glargine) .... 25 units evry evening 5)  Clotrimazole 1 % Crea (Clotrimazole) .... Apply twice daily to rash for 3 weeks then as needed 6)  Benazepril Hcl  5 Mg Tabs (Benazepril hcl) .... Take 1 tablet by mouth once a day 7)  Flagyl 500 Mg Tabs (Metronidazole) .... Take 1 tablet by mouth two times a day  Other Orders: Glucose, (CBG) (82962) Hemoglobin A1C [id number])  Patient Instructions: 1)  Please schedule a follow-up appointment in 3 months. 2)  Your sugar is doing well, no med changes. 3)  It is not healthy  for men to drink more than 2-3 drinks per day or for women to drink more than 1-2 drinks per day. 4)  Tobacco is very bad for your health and your loved ones! You Should stop smoking!. 5)  Stop Smoking Tips: Choose a Quit date. Cut down before the Quit date. decide what you will do as a substitute when you feel the urge to smoke(gum,toothpick,exercise). 6)  you need to stop using drugs  Laboratory Results   Blood Tests   Date/Time Received: April 29, 2009  Date/Time Reported: April 29, 2009   Glucose (fasting): 234 mg/dL   (Normal Range: 70-105) HGBA1C: 6.3%   (Normal Range: Non-Diabetic - 3-6%   Control Diabetic - 6-8%)

## 2010-04-22 NOTE — Letter (Signed)
Summary: DIABETIC SUPPLIES  DIABETIC SUPPLIES   Imported By: Dierdre Harness 01/03/2010 09:33:02  _____________________________________________________________________  External Attachment:    Type:   Image     Comment:   External Document

## 2010-04-24 NOTE — Assessment & Plan Note (Signed)
Summary: physical   Vital Signs:  Patient profile:   50 year old female Menstrual status:  irregular Height:      67 inches Weight:      150.25 pounds BMI:     23.62 O2 Sat:      99 % on Room air Pulse rate:   95 / minute Pulse rhythm:   regular Resp:     16 per minute BP sitting:   120 / 80  (left arm)  Vitals Entered By: Baldomero Lamy LPN (December 28, 624THL 9:33 AM)  O2 Flow:  Room air CC: refused cpe Is Patient Diabetic? Yes Comments upon review of med bottles, filled dates are from 2008,2009,2010, and early 2011   Vision Screening:Left eye w/o correction: 20 / 30 Right Eye w/o correction: 20 / 70 Both eyes w/o correction:  20/ 40        Vision Entered By: Baldomero Lamy LPN (December 28, 624THL 9:35 AM)   Primary Care Jamarkis Branam:  Tula Nakayama MD  CC:  refused cpe.  History of Present Illness: Reports  that she has not been doing well. She is depressed, not testing her sugars or taking her meds regularly. she continues to use illicit drugs. Denies recent fever or chills. Denies sinus pressure, nasal congestion , ear pain or sore throat. Denies chest congestion, or cough productive of sputum. Denies chest pain, palpitations, PND, orthopnea or leg swelling. Denies abdominal pain, nausea, vomitting, diarrhea or constipation. Denies change in bowel movements or bloody stool. Denies dysuria , frequency, incontinence or hesitancy. Denies  joint pain, swelling, or reduced mobility. Denies headaches, vertigo, seizures.  Denies  rash, lesions, or itch.     Allergies: 1)  ! Pcn  Review of Systems      See HPI General:  Complains of fatigue and malaise. Eyes:  Denies eye pain and red eye. Psych:  Complains of anxiety, depression, irritability, and mental problems; denies suicidal thoughts/plans, thoughts of violence, and unusual visions or sounds. Endo:  Denies cold intolerance, excessive hunger, excessive thirst, and excessive urination. Heme:  Denies abnormal  bruising and bleeding. Allergy:  Denies hives or rash and itching eyes.  Physical Exam  General:  Alert,and cooperative throughout examination. Pt weqaring dark shades and refusing to remove them.adequately nourished. HEENT: No facial asymmetry,  EOMI, No sinus tenderness, oropharynx   moist. Poor dention  Chest: Clear to auscultation bilaterally.  CVS: S1, S2, No murmurs, No S3.   Abd: Soft, Nontender.  MS: Adequate ROM spine, hips, shoulders and knees.  Ext: No edema.   CNS: CN 2-12 intact, power tone and sensation normal throughout.   Skin: Intact, no visible lesions or rashes.  Psych: normal affect.  Memory loss, depressed appearing.    Impression & Recommendations:  Problem # 1:  HYPERTENSION (ICD-401.9) Assessment Unchanged  BP today: 120/80 Prior BP: 120/90 (11/06/2009)  Labs Reviewed: K+: 4.3 (03/03/2010) Creat: : 0.73 (03/03/2010)   Chol: 92 (03/03/2010)   HDL: 35 (03/03/2010)   LDL: 25 (03/03/2010)   TG: 162 (03/03/2010)  Problem # 2:  DIABETES MELLITUS, TYPE II (ICD-250.00) Assessment: Deteriorated  The following medications were removed from the medication list:    Riomet 500 Mg/52ml Soln (Metformin hcl) .[name]... 2 teaspoons twice daily    Glipizide 10 Mg Tabs (Glipizide) .[name]... Take 1 tablet by mouth two times a day    Lantus Solostar 100 Unit/ml Soln (Insulin glargine) .[name]... 25 units evry evening Her updated medication list for this problem includes:  Glipizide 10 Mg Xr24h-tab (Glipizide) .[name]... Take 1 tablet by mouth once a day  Labs Reviewed: Creat: 0.73 (03/03/2010)     Last Eye Exam: normal (03/12/2009) Reviewed HgBA1c results: 8.9 (03/03/2010)  6.8 (08/09/2009)  Problem # 3:  DEPRESSION (ICD-311) Assessment: Deteriorated  The following medications were removed from the medication list:    Fluoxetine Hcl 10 Mg Caps (Fluoxetine hcl) .[name]... Take 1 capsule by mouth once a day Her updated medication list for this problem includes:    Fluoxetine Hcl 10 Mg  Caps (Fluoxetine hcl) .[name]... 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 Once daily testing and supplies 2)  Prodigy Blood Glucose Test Strp (Glucose blood) .... Once daily testing 3)  Glipizide 10 Mg Xr24h-tab (Glipizide) .... Take 1 tablet by mouth once a day 4)  Fluoxetine Hcl 10 Mg Caps (Fluoxetine hcl) .... Take 1 capsule by mouth once a day 5)  Lomotil 2.5-0.025 Mg Tabs (Diphenoxylate-atropine) .... Take 1 tablet by mouth four times a day as needed for diarreah  Patient Instructions: 1)  CPE in 3 to 4 weeks. 2)  You absolutely need to take the two meds prescribed once daily. 3)  Check your blood sugars once daily.. If your readings are usually above 250: or below 70 you should contact our office. 4)  We will try to get a nurse to come and put out your meds for you once weekly 5)  We will call your Aunt diane at [phone] when this is arranged We will call your nurse [phone]: LOMOTIL 2.5-0.025 MG TABS (DIPHENOXYLATE-ATROPINE) Take 1 tablet by mouth four times a day as needed for diarreah  #20 x 0   Entered and Authorized by:   Tula Nakayama MD   Signed by:   Tula Nakayama MD on 03/19/2010   Method used:   Printed then faxed to ...       [facility] (retail)       [place] [address]       [place], [place]  [address]       Ph: [phone]       Fax: [phone]   RxID:   [id number] FLUOXETINE HCL 10 MG CAPS (FLUOXETINE HCL) Take 1 capsule by mouth once a day  #30 x 3   Entered and Authorized by:   Tula Nakayama MD   Signed by:   Tula Nakayama MD on 03/19/2010   Method used:   Electronically to        [facility] (retail)       [place] [address] [place], [place]  [address]       Ph: [phone]       Fax: [phone]   RxID:    [id number] GLIPIZIDE 10 MG XR24H-TAB (GLIPIZIDE) Take 1 tablet by mouth once a day  #30 x 2   Entered and Authorized by:   Tula Nakayama MD   Signed by:   Tula Nakayama MD on 03/19/2010   Method used:   Electronically to        [facility] (retail)       [place] [address]       [place],   [address]  Ph: [phone]       Fax: [phone]   RxID:   [id number]    Orders Added: 1)  Est. Patient Level IV [id number]  Appended Document: physical pls try and see if a nurse can be arranged to package meds for this pt, [facility]  Appended Document: physical referal sent

## 2010-04-24 NOTE — Miscellaneous (Signed)
Summary: Orders Update  Clinical Lists Changes  Orders: Added new Test order of T-Basic Metabolic Panel ([id number]) - Signed Added new Test order of T-Lipid Profile [id number]) - Signed Added new Test order of T-Hepatic Function [id number]) - Signed Added new Test order of T- Hemoglobin A1C [id number]) - Signed Added new Test order of T-CBC w/Diff [id number]) - Signed

## 2010-04-24 NOTE — Miscellaneous (Signed)
[facility]   Imported By: Dierdre Harness 03/26/2010 15:20:30  _____________________________________________________________________  External Attachment:    Type:   Image     Comment:   External Document

## 2010-05-08 ENCOUNTER — Encounter (INDEPENDENT_AMBULATORY_CARE_PROVIDER_SITE_OTHER): Payer: Medicaid Other | Admitting: Family Medicine

## 2010-05-08 ENCOUNTER — Other Ambulatory Visit: Payer: Self-pay | Admitting: Family Medicine

## 2010-05-08 ENCOUNTER — Other Ambulatory Visit (HOSPITAL_COMMUNITY)
Admission: RE | Admit: 2010-05-08 | Discharge: 2010-05-08 | Disposition: A | Discharge status: Home / Self Care | Payer: Medicaid Other | Source: Ambulatory Visit | Attending: Family Medicine | Admitting: Family Medicine

## 2010-05-08 ENCOUNTER — Encounter: Payer: Self-pay | Admitting: Family Medicine

## 2010-05-08 DIAGNOSIS — Z1211 Encounter for screening for malignant neoplasm of colon: Secondary | ICD-10-CM

## 2010-05-08 DIAGNOSIS — Z124 Encounter for screening for malignant neoplasm of cervix: Secondary | ICD-10-CM

## 2010-05-08 DIAGNOSIS — Z0000 Encounter for general adult medical examination without abnormal findings: Secondary | ICD-10-CM

## 2010-05-08 DIAGNOSIS — Z01419 Encounter for gynecological examination (general) (routine) without abnormal findings: Secondary | ICD-10-CM | POA: Insufficient documentation

## 2010-05-13 ENCOUNTER — Encounter: Payer: Self-pay | Admitting: Family Medicine

## 2010-05-14 NOTE — Assessment & Plan Note (Signed)
Summary: physical   Vital Signs:  Patient profile:   50 year old female Menstrual status:  irregular Height:      67 inches Weight:      151 pounds BMI:     23.74 O2 Sat:      97 % Pulse rate:   101 / minute Pulse rhythm:   regular Resp:     16 per minute BP sitting:   120 / 90  (left arm)  Vitals Entered By: Kate Sable LPN (February 16, [id number] 9:35 AM) CC: CPE   Vision Screening:Left eye w/o correction: 20 / 25 Right Eye w/o correction: 20 / 40 Both eyes w/o correction:  20/ 25  Color vision testing: normal      Vision Entered By: Kate Sable LPN (February 16, [id number] 9:39 AM)   Primary Care Provider:  Tula Nakayama MD  CC:  CPE .  History of Present Illness: Reports  that  she has been doing fairly well. Denies recent fever or chills. Denies sinus pressure, nasal congestion , ear pain or sore throat. Denies chest congestion, or cough productive of sputum. Denies chest pain, palpitations, PND, orthopnea or leg swelling. Denies abdominal pain, nausea, vomitting, diarrhea or constipation. Denies change in bowel movements or bloody stool. Denies dysuria , frequency, incontinence or hesitancy. Denies  joint pain, swelling, or reduced mobility. Denies headaches, vertigo, seizures. Denies uncontrolled depression, anxiety or insomnia. Denies  rash, lesions, or itch. She continues to abuse drugs, an she does not test her blood sugars regularly.     Current Medications (verified): 1)  Prodigy Meter .... Once Daily Testing and Supplies 2)  Prodigy Blood Glucose Test  Strp (Glucose Blood) .... Once Daily Testing 3)  Glipizide 10 Mg Xr24h-Tab (Glipizide) .... Take 1 Tablet By Mouth Once A Day  Allergies: 1)  ! Pcn 2)  ! * Fluoxetine  Review of Systems      See HPI General:  Complains of fatigue. Eyes:  Complains of vision loss-both eyes. Psych:  Complains of anxiety, depression, and mental problems; denies suicidal thoughts/plans, thoughts of violence, and thoughts  /plans of harming others. Endo:  Denies excessive thirst and excessive urination. Heme:  Denies abnormal bruising and bleeding. Allergy:  Complains of seasonal allergies; denies hives or rash and itching eyes.  Physical Exam  General:  Well-developed,well-nourished,in no acute distress; alert,appropriate and cooperative throughout examination Head:  normocephalic and no abnormalities palpated.   Eyes:  vision grossly intact, pupils equal, pupils round, and pupils reactive to light.   Ears:  External ear exam shows no significant lesions or deformities.  Otoscopic examination reveals clear canals, tympanic membranes are intact bilaterally without bulging, retraction, inflammation or discharge. Hearing is grossly normal bilaterally. Nose:  no external deformity and no nasal discharge.   Mouth:  pharynx pink and moist, poor dentition, and teeth missing.   Neck:  No deformities, masses, or tenderness noted. Chest Wall:  No deformities, masses, or tenderness noted. Breasts:  No mass, nodules, thickening, tenderness, bulging, retraction, inflamation, nipple discharge or skin changes noted.   Lungs:  Normal respiratory effort, chest expands symmetrically. Lungs are clear to auscultation, no crackles or wheezes. Heart:  Normal rate and regular rhythm. S1 and S2 normal without gallop, murmur, click, rub or other extra sounds. Abdomen:  Bowel sounds positive,abdomen soft and non-tender without masses, organomegaly or hernias noted. Rectal:  No external abnormalities noted. Normal sphincter tone. No rectal masses or tenderness. Genitalia:  Normal introitus for age, no external lesions, no  vaginal discharge, mucosa pink and moist, no vaginal or cervical lesions, no vaginal atrophy, no friaility or hemorrhage, normal uterus size and position, no adnexal masses or tenderness Msk:  No deformity or scoliosis noted of thoracic or lumbar spine.   Pulses:  R and L carotid,radial,femoral,dorsalis pedis and  posterior tibial pulses are full and equal bilaterally Extremities:  No clubbing, cyanosis, edema, or deformity noted with normal full range of motion of all joints.   Neurologic:  No cranial nerve deficits noted. Station and gait are normal. Plantar reflexes are down-going bilaterally. DTRs are symmetrical throughout. Sensory, motor and coordinative functions appear intact. Skin:  Intact without suspicious lesions or rashes Cervical Nodes:  No lymphadenopathy noted Axillary Nodes:  No palpable lymphadenopathy Inguinal Nodes:  No significant adenopathy Psych:  Oriented X3, slightly anxious, poor concentration, and memory impairment.    Diabetes Management Exam:    Foot Exam (with socks and/or shoes not present):       Sensory-Monofilament:          Left foot: diminished          Right foot: diminished       Inspection:          Left foot: normal          Right foot: normal       Nails:          Left foot: thickened          Right foot: thickened   Impression & Recommendations:  Problem # 1:  PHYSICAL EXAMINATION (ICD-V70.0) Assessment Comment Only pap sent, rectal reveals guaic neg stool  Problem # 2:  HYPERTENSION (ICD-401.9) Assessment: Deteriorated  BP today: 120/90 Prior BP: 120/80 (03/19/2010) Patient advised to follow low sodium diet rich in fruit and vegetables, and to commit to at least 30 minutes 5 days per week of regular exercise , to improve blood presure control.   Labs Reviewed: K+: 4.3 (03/03/2010) Creat: : 0.73 (03/03/2010)   Chol: 92 (03/03/2010)   HDL: 35 (03/03/2010)   LDL: 25 (03/03/2010)   TG: 162 (03/03/2010)  Problem # 3:  DIABETES MELLITUS, TYPE II (ICD-250.00) Assessment: Comment Only  Her updated medication list for this problem includes:    Glipizide 10 Mg Xr24h-tab (Glipizide) .[name]... Take 1 tablet by mouth once a day Patient advised to reduce carbs and sweets, commit to regular physical activity, take meds as prescribed, test blood sugars as  directed, and attempt to lose weight , to improve blood sugar control.'  Labs Reviewed: Creat: 0.73 (03/03/2010)     Last Eye Exam: normal (03/12/2009) Reviewed HgBA1c results: 8.9 (03/03/2010)  6.8 (08/09/2009)  Complete Medication List: 1)  Prodigy Meter  .... Once daily testing and supplies 2)  Prodigy Blood Glucose Test Strp (Glucose blood) .... Once daily testing 3)  Glipizide 10 Mg Xr24h-tab (Glipizide) .... Take 1 tablet by mouth once a day  Other Orders: Pap Smear [id number]) Hemoccult Guaiac-1 spec.(in office) [id number])  Patient Instructions: 1)  F/U in early April. 2)  Labs end March, fasting, we will give you another sheet   Orders Added: 1)  Est. Patient 40-64 years [id number] 2)  Pap Smear [88150] 3)  Hemoccult Guaiac-1 spec.(in office) [[id number]]    Laboratory Results  Date/Time Received: May 08, 2010 5:14 PM  Date/Time Reported: May 08, 2010 5:14 PM   Stool - Occult Blood Hemmoccult #1: negative Date: 05/08/2010 Comments: 5030 5/14 51301 13L 10/13 Baldomero Lamy LPN  February 16, [id number] 5:14 PM

## 2010-05-20 NOTE — Letter (Signed)
Summary: Pap Smear, Normal Letter, [facility]  [address]   [place], [place] [address]   Phone: [phone]  Fax: [phone]          May 13, 2010    Dear: Latoya Kitchen Walsh    I am pleased to notify you that your PAP smear was normal.  You will need your next PAP smear in:     ____ 3 Months    ____ 6 Months    ____ 12 Months    Please call the office at our office number above, to schedule your next appointment.    Sincerely,     [facility]

## 2010-06-24 ENCOUNTER — Encounter: Payer: Self-pay | Admitting: Family Medicine

## 2010-06-28 LAB — BASIC METABOLIC PANEL
BUN: 10 mg/dL (ref 6–23)
CO2: 28 mEq/L (ref 19–32)
Chloride: 100 mEq/L (ref 96–112)
GFR calc non Af Amer: >60 mL/min (ref >60)
Glucose, Bld: 437 mg/dL — ABNORMAL HIGH (ref 70–99)
Potassium: 4.4 mEq/L (ref 3.5–5.1)

## 2010-06-28 LAB — URINALYSIS, ROUTINE W REFLEX MICROSCOPIC
Bilirubin Urine: NEGATIVE
Hgb urine dipstick: NEGATIVE
Ketones, ur: NEGATIVE mg/dL
Nitrite: NEGATIVE
Protein, ur: NEGATIVE mg/dL
Specific Gravity, Urine: 1.01 (ref 1.005–1.030)
Urobilinogen, UA: 1 mg/dL (ref 0.0–1.0)

## 2010-06-28 LAB — CBC
Hemoglobin: 14.5 g/dL (ref 12.0–15.0)
MCHC: 35.5 g/dL (ref 30.0–36.0)
MCV: 95.3 fL (ref 78.0–100.0)
RBC: 4.28 MIL/uL (ref 3.87–5.11)
RDW: 12.3 % (ref 11.5–15.5)

## 2010-06-28 LAB — DIFFERENTIAL
Basophils Absolute: 0 10*3/uL (ref 0.0–0.1)
Basophils Relative: 1 % (ref 0–1)
Eosinophils Absolute: 0.1 10*3/uL (ref 0.0–0.7)
Eosinophils Relative: 2 % (ref 0–5)
Lymphocytes Relative: 48 % — ABNORMAL HIGH (ref 12–46)
Monocytes Absolute: 0.4 10*3/uL (ref 0.1–1.0)
Monocytes Relative: 11 % (ref 3–12)
Neutro Abs: 1.5 10*3/uL — ABNORMAL LOW (ref 1.7–7.7)

## 2010-06-28 LAB — GLUCOSE, CAPILLARY

## 2010-06-28 LAB — URINE MICROSCOPIC-ADD ON

## 2010-06-30 ENCOUNTER — Ambulatory Visit (INDEPENDENT_AMBULATORY_CARE_PROVIDER_SITE_OTHER): Payer: Medicaid Other | Admitting: Family Medicine

## 2010-06-30 ENCOUNTER — Encounter: Payer: Self-pay | Admitting: Family Medicine

## 2010-06-30 DIAGNOSIS — I10 Essential (primary) hypertension: Secondary | ICD-10-CM

## 2010-06-30 DIAGNOSIS — E119 Type 2 diabetes mellitus without complications: Secondary | ICD-10-CM

## 2010-06-30 NOTE — Patient Instructions (Signed)
F/U in 4 months   You need  To stop using drugs.  Blood work today chem7 and hBA1C, Pls  Check with your Aunt about the result , it seems as if you need more insulin since you report fasting sugars between 170 to 210. pls call tomorrow to find out about the dose of insulin, since you are taking 24 units , I think you will need to increase this

## 2010-06-30 NOTE — Progress Notes (Signed)
  Subjective:    Patient ID: Latoya Walsh, female    DOB: Aug 18, 1960, 50 y.o.   MRN: [id number]  HPI  2 month h/o left hip pain radiating down ,  to mid thigh, sometimes awakens pt. No recent h/o trauma to the leg. No swelling o skin breakdown reported. Buena is non compliant with her meds and diet. She tests her sugars sporadically and reports them to be high. She uses her sister's insulin reportedly. She denies recent drug use, specifically in the past 3 to 5 days.  Review of Systems Denies recent fever or chills. Denies sinus pressure, nasal congestion, ear pain or sore throat. Denies chest congestion, productive cough or wheezing. Denies chest pains, palpitations, paroxysmal nocturnal dyspnea, orthopnea and leg swelling Denies abdominal pain, nausea, vomiting,diarrhea or constipation.  Denies rectal bleeding or change in bowel movement. Denies dysuria, frequency, hesitancy or incontinence. Has chronic  depression, anxiety and insomnia.She self medicates , and is non compliant with prescribed medication and refuses therapy. Denies skin break down or rash.       Objective:   Physical Exam Patient alert and oriented and in no Cardiopulmonary distress.  HEENT: No facial asymmetry, EOMI, no sinus tenderness,Poor dentition, hydrated.  Neck supple no adenopathy.  Chest: Clear to auscultation bilaterally.  CVS: S1, S2 no murmurs, no S3.  ABD: Soft non tender. Bowel sounds normal.  Ext: No edema  MS: Adequate ROM spine, shoulders, hips and knees.  CNS: CN 2-12 intact, power, tone and sensation normal throughout.        Assessment & Plan:  Diabetes: uncontrolled, educated again about the importance of compliance, HBA1C to be checked. 2. Hip pain: exam within normal, advised use of otc pain med eg advil  3. Illicit drug use: encouraged pt to quit

## 2010-07-01 LAB — BASIC METABOLIC PANEL
BUN: 13 mg/dL (ref 6–23)
CO2: 24 mEq/L (ref 19–32)
Chloride: 104 mEq/L (ref 96–112)
Creat: 0.77 mg/dL (ref 0.40–1.20)
Potassium: 4.5 mEq/L (ref 3.5–5.3)

## 2010-07-01 LAB — HEMOGLOBIN A1C: Mean Plasma Glucose: 212 mg/dL — ABNORMAL HIGH (ref <117)

## 2010-07-07 ENCOUNTER — Encounter: Payer: Self-pay | Admitting: Family Medicine

## 2010-07-23 ENCOUNTER — Telehealth: Payer: Self-pay | Admitting: Family Medicine

## 2010-07-23 ENCOUNTER — Other Ambulatory Visit: Payer: Self-pay

## 2010-07-23 MED ORDER — GLIPIZIDE ER 10 MG PO TB24: 10.0000 mg | ORAL_TABLET | Freq: Every day | ORAL | Status: DC

## 2010-07-23 NOTE — Telephone Encounter (Signed)
pls send in glipizide 5 mg one twice daily #60 refill 2 , as far as I know she does not needinsulin at this time let her know

## 2010-07-23 NOTE — Telephone Encounter (Signed)
Glipizide refilled to [facility]. Aid states that she will go get it and make sure she is taking it as prescribed since her fasting sugars have been elevated. I advised to make sure she brought all meds to the next OV

## 2010-07-23 NOTE — Telephone Encounter (Signed)
Her aid stated she wasn't there but was wanting some insulin pens faxed in to [facility] because she didn't have any and she was using her sisters. She doesn't have any insulin on her medlist. She is just supposed to be taking glipizide. Never brings her meds to OV and is very uncompliant with rx regimen. Apothecary states she hasn't picked up her meds in over 6 months. Aid aware

## 2010-09-29 ENCOUNTER — Telehealth: Payer: Self-pay | Admitting: Family Medicine

## 2010-09-29 NOTE — Telephone Encounter (Signed)
Patient does not have insulin on her med list

## 2010-10-01 ENCOUNTER — Other Ambulatory Visit: Payer: Self-pay | Admitting: Family Medicine

## 2010-10-01 DIAGNOSIS — Z139 Encounter for screening, unspecified: Secondary | ICD-10-CM

## 2010-10-14 ENCOUNTER — Other Ambulatory Visit: Payer: Self-pay | Admitting: Family Medicine

## 2010-10-20 ENCOUNTER — Ambulatory Visit (HOSPITAL_COMMUNITY): Payer: Medicaid Other

## 2010-10-29 ENCOUNTER — Encounter: Payer: Self-pay | Admitting: Family Medicine

## 2010-10-30 ENCOUNTER — Ambulatory Visit (INDEPENDENT_AMBULATORY_CARE_PROVIDER_SITE_OTHER): Payer: Medicaid Other | Admitting: Family Medicine

## 2010-10-30 ENCOUNTER — Encounter: Payer: Self-pay | Admitting: Family Medicine

## 2010-10-30 VITALS — BP 112/82 | HR 88 | Resp 16 | Ht 67.0 in | Wt 151.0 lb

## 2010-10-30 DIAGNOSIS — L309 Dermatitis, unspecified: Secondary | ICD-10-CM

## 2010-10-30 DIAGNOSIS — L259 Unspecified contact dermatitis, unspecified cause: Secondary | ICD-10-CM

## 2010-10-30 DIAGNOSIS — E119 Type 2 diabetes mellitus without complications: Secondary | ICD-10-CM

## 2010-10-30 DIAGNOSIS — R5383 Other fatigue: Secondary | ICD-10-CM

## 2010-10-30 DIAGNOSIS — I10 Essential (primary) hypertension: Secondary | ICD-10-CM

## 2010-10-30 DIAGNOSIS — Z9119 Patient's noncompliance with other medical treatment and regimen: Secondary | ICD-10-CM

## 2010-10-30 DIAGNOSIS — Z91199 Patient's noncompliance with other medical treatment and regimen due to unspecified reason: Secondary | ICD-10-CM

## 2010-10-30 DIAGNOSIS — Z23 Encounter for immunization: Secondary | ICD-10-CM

## 2010-10-30 DIAGNOSIS — R5381 Other malaise: Secondary | ICD-10-CM

## 2010-10-30 DIAGNOSIS — F1920 Other psychoactive substance dependence, uncomplicated: Secondary | ICD-10-CM

## 2010-10-30 DIAGNOSIS — F17200 Nicotine dependence, unspecified, uncomplicated: Secondary | ICD-10-CM

## 2010-10-30 MED ORDER — CLOTRIMAZOLE-BETAMETHASONE 1-0.05 % EX CREA: TOPICAL_CREAM | CUTANEOUS | Status: DC

## 2010-10-30 MED ORDER — INSULIN GLARGINE 100 UNIT/ML ~~LOC~~ SOLN: 10.0000 [IU] | Freq: Every day | SUBCUTANEOUS | Status: DC

## 2010-10-30 MED ORDER — INSULIN ASPART 100 UNIT/ML ~~LOC~~ SOLN
7.0000 [IU] | Freq: Once | SUBCUTANEOUS | Status: AC
  Administered 2010-10-30: 7 [IU] via SUBCUTANEOUS

## 2010-10-30 MED ORDER — GLUCOSE BLOOD VI STRP: ORAL_STRIP | Status: DC

## 2010-10-30 MED ORDER — GLIPIZIDE ER 10 MG PO TB24: 10.0000 mg | ORAL_TABLET | Freq: Every day | ORAL | Status: DC

## 2010-10-30 NOTE — Patient Instructions (Addendum)
F/U in 3 month  All 3 medications are sent in, I will also give you the same prescription in your hand to take to the pharmacy.  HBA1C, TSH    And chem7 today or tomorrow.   HBA1C   And chem7 in 3 months  3 to 5 days before appt.  LABWORK  NEEDS TO BE DONE BETWEEN 3 TO 7 DAYS BEFORE YOUR NEXT SCEDULED  VISIT.  THIS WILL IMPROVE THE QUALITY OF YOUR CARE.  Pneumonia  Vaccine today

## 2010-10-31 LAB — BASIC METABOLIC PANEL
CO2: 26 mEq/L (ref 19–32)
Calcium: 9.5 mg/dL (ref 8.4–10.5)
Chloride: 103 mEq/L (ref 96–112)
Creat: 0.81 mg/dL (ref 0.50–1.10)
Sodium: 139 mEq/L (ref 135–145)

## 2010-10-31 LAB — TSH: TSH: 2.052 u[IU]/mL (ref 0.350–4.500)

## 2010-10-31 LAB — HEMOGLOBIN A1C: Mean Plasma Glucose: 252 mg/dL — ABNORMAL HIGH (ref <117)

## 2010-11-01 DIAGNOSIS — Z9119 Patient's noncompliance with other medical treatment and regimen: Secondary | ICD-10-CM | POA: Insufficient documentation

## 2010-11-01 DIAGNOSIS — F17200 Nicotine dependence, unspecified, uncomplicated: Secondary | ICD-10-CM | POA: Insufficient documentation

## 2010-11-01 NOTE — Assessment & Plan Note (Signed)
Reports using less cocaine, states she notices people dying around her because of this so she is using less

## 2010-11-01 NOTE — Progress Notes (Signed)
  Subjective:    Patient ID: Latoya Walsh, female    DOB: 1960-08-02, 50 y.o.   MRN: [id number]  HPI The PT is here for follow up and re-evaluation of chronic medical conditions, medication management and review of any available recent lab and radiology data.  Preventive health is updated, specifically  Cancer screening and Immunization.   C/o itchy rash on both arms, has been using bleach. C/o not having medication for her diabetes and she has not been testing either , states has no supplies. Has a talking meter, unable to read, but now being taught in her home , she reports.    Review of Systems . Denies recent fever or chills. Denies sinus pressure, nasal congestion, ear pain or sore throat. Denies chest congestion, productive cough or wheezing. Denies chest pains, palpitations and leg swelling Denies abdominal pain, nausea, vomiting,diarrhea or constipation.   Denies dysuria, frequency, hesitancy or incontinence. Denies joint pain, swelling and limitation in mobility.     Objective:   Physical Exam  Patient alert and oriented and in no cardiopulmonary distress.  HEENT: No facial asymmetry, EOMI, no sinus tenderness,  oropharynx  moist, poor dentition.  Neck supple no adenopathy.  Chest: Clear to auscultation bilaterally.  CVS: S1, S2 no murmurs, no S3.  ABD: Soft non tender. Bowel sounds normal.  Ext: No edema  MS: Adequate ROM spine, shoulders, hips and knees.  Skin: Intact, hypopigmented circular rashes on both arms, max diameter approx 2 inches   Psych: Good eye contact, . Memory mildly impaired not anxious or depressed appearing.  CNS: CN 2-12 intact, power, normal throughout.       Assessment & Plan:

## 2010-11-01 NOTE — Assessment & Plan Note (Signed)
Everyday smoker, no commitment to quitting at this time

## 2010-11-01 NOTE — Assessment & Plan Note (Signed)
Uncontrolled due to non compliance. Re-education attempted

## 2010-11-01 NOTE — Assessment & Plan Note (Signed)
Scaly hypopigmented macular rash, will treat for fungal infection. Pt advised to discontinue using bleach on her skin

## 2010-11-28 ENCOUNTER — Ambulatory Visit (HOSPITAL_COMMUNITY)
Admission: RE | Admit: 2010-11-28 | Discharge: 2010-11-28 | Disposition: A | Discharge status: Home / Self Care | Payer: Medicaid Other | Source: Ambulatory Visit | Attending: Family Medicine | Admitting: Family Medicine

## 2010-11-28 DIAGNOSIS — Z1231 Encounter for screening mammogram for malignant neoplasm of breast: Secondary | ICD-10-CM | POA: Insufficient documentation

## 2010-11-28 DIAGNOSIS — Z139 Encounter for screening, unspecified: Secondary | ICD-10-CM

## 2010-12-03 ENCOUNTER — Ambulatory Visit: Payer: Medicaid Other

## 2011-01-20 ENCOUNTER — Telehealth: Payer: Self-pay | Admitting: Family Medicine

## 2011-01-23 NOTE — Telephone Encounter (Signed)
Already has refills at [facility]. States her and her sister received yesterday

## 2011-02-02 ENCOUNTER — Encounter: Payer: Self-pay | Admitting: Family Medicine

## 2011-02-03 ENCOUNTER — Ambulatory Visit: Payer: Medicaid Other | Admitting: Family Medicine

## 2011-02-04 ENCOUNTER — Encounter: Payer: Self-pay | Admitting: Family Medicine

## 2011-02-05 ENCOUNTER — Ambulatory Visit (INDEPENDENT_AMBULATORY_CARE_PROVIDER_SITE_OTHER): Payer: Medicaid Other | Admitting: Family Medicine

## 2011-02-05 ENCOUNTER — Encounter: Payer: Self-pay | Admitting: Family Medicine

## 2011-02-05 VITALS — BP 114/80 | HR 113 | Resp 16 | Ht 67.5 in | Wt 157.0 lb

## 2011-02-05 DIAGNOSIS — IMO0001 Reserved for inherently not codable concepts without codable children: Secondary | ICD-10-CM

## 2011-02-05 DIAGNOSIS — F32A Depression, unspecified: Secondary | ICD-10-CM

## 2011-02-05 DIAGNOSIS — R5383 Other fatigue: Secondary | ICD-10-CM

## 2011-02-05 DIAGNOSIS — E1065 Type 1 diabetes mellitus with hyperglycemia: Secondary | ICD-10-CM

## 2011-02-05 DIAGNOSIS — E119 Type 2 diabetes mellitus without complications: Secondary | ICD-10-CM

## 2011-02-05 DIAGNOSIS — Z23 Encounter for immunization: Secondary | ICD-10-CM

## 2011-02-05 DIAGNOSIS — F17200 Nicotine dependence, unspecified, uncomplicated: Secondary | ICD-10-CM

## 2011-02-05 DIAGNOSIS — F419 Anxiety disorder, unspecified: Secondary | ICD-10-CM | POA: Insufficient documentation

## 2011-02-05 DIAGNOSIS — R5381 Other malaise: Secondary | ICD-10-CM

## 2011-02-05 DIAGNOSIS — F329 Major depressive disorder, single episode, unspecified: Secondary | ICD-10-CM

## 2011-02-05 DIAGNOSIS — Z9119 Patient's noncompliance with other medical treatment and regimen: Secondary | ICD-10-CM

## 2011-02-05 MED ORDER — FLUOXETINE HCL 10 MG PO CAPS: 10.0000 mg | ORAL_CAPSULE | Freq: Every day | ORAL | Status: DC

## 2011-02-05 NOTE — Patient Instructions (Addendum)
F/u in 4 months  New med for depression.  HBA1C and chem 7 today   Please think about quitting smoking.  This is very important for your health.  Consider setting a quit date, then cutting back or switching brands to prepare to stop.  Also think of the money you will save every day by not smoking.  Quick Tips to Quit Smoking: Fix a date i.e. keep a date in mind from when you would not touch a tobacco product to smoke  Keep yourself busy and block your mind with work loads or reading books or watching movies in malls where smoking is not allowed  Vanish off the things which reminds you about smoking for example match box, or your favorite lighter, or the pipe you used for smoking, or your favorite jeans and shirt with which you used to enjoy smoking, or the club where you used to do smoking  Try to avoid certain people places and incidences where and with whom smoking is a common factor to add on  Praise yourself with some token gifts from the money you saved by stopping smoking  Anti Smoking teams are there to help you. Join their programs  Anti-smoking Gums are there in many medical shops. Try them to quit smoking   Side-effects of Smoking: Disease caused by smoking cigarettes are emphysema, bronchitis, heart failures  Premature death  Cancer is the major side effect of smoking  Heart attacks and strokes are the quick effects of smoking causing sudden death  Some smokers lives end up with limbs amputated  Breathing problem or fast breathing is another side effect of smoking  Due to more intakes of smokes, carbon mono-oxide goes into your brain and other muscles of the body which leads to swelling of the veins and blockage to the air passage to lungs  Carbon monoxide blocks blood vessels which leads to blockage in the flow of blood to different major body organs like heart lungs and thus leads to attacks and deaths  During pregnancy smoking is very harmful and leads to premature birth of  the infant, spontaneous abortions, low weight of the infant during birth  Fat depositions to narrow and blocked blood vessels causing heart attacks  In many cases cigarette smoking caused infertility in men

## 2011-02-07 LAB — BASIC METABOLIC PANEL
CO2: 27 mEq/L (ref 19–32)
Calcium: 9.1 mg/dL (ref 8.4–10.5)
Potassium: 4.4 mEq/L (ref 3.5–5.3)
Sodium: 140 mEq/L (ref 135–145)

## 2011-02-07 LAB — HEMOGLOBIN A1C: Mean Plasma Glucose: 214 mg/dL — ABNORMAL HIGH (ref <117)

## 2011-02-08 NOTE — Progress Notes (Signed)
  Subjective:    Patient ID: Latoya Walsh, female    DOB: Feb 22, 1961, 50 y.o.   MRN: [id number]  HPI The PT is here for follow up and re-evaluation of chronic medical conditions, medication management and review of any available recent lab and radiology data.  Preventive health is updated, specifically  Cancer screening and Immunization.   Questions or concerns regarding consultations or procedures which the PT has had in the interim are  addressed. The PT denies any adverse reactions to current medications since the last visit.  C/o increased symptoms of depression, excessive crying, misses her Mom, not suicidal or homicidal, states she has a new partner who also suffers from depression. Takes insulin sporadically on no conssitent basis, nutrition is also a concern      Review of Systems See HPI Denies recent fever or chills. Denies sinus pressure, nasal congestion, ear pain or sore throat. Denies chest congestion, productive cough or wheezing. Denies chest pains, palpitations and leg swelling Denies abdominal pain, nausea, vomiting,diarrhea or constipation.   Denies dysuria, frequency, hesitancy or incontinence. Denies joint pain, swelling and limitation in mobility. Denies headaches, seizures, numbness, or tingling.  Denies skin break down or rash.        Objective:   Physical Exam  Patient alert and oriented and in no cardiopulmonary distress.  HEENT: No facial asymmetry, EOMI, no sinus tenderness,  oropharynx pink and moist.  Neck supple no adenopathy.Poor dentition  Chest: Clear to auscultation bilaterally.Decreased air entry bilaterally  CVS: S1, S2 no murmurs, no S3.  ABD: Soft non tender. Bowel sounds normal.  Ext: No edema  MS: Adequate ROM spine, shoulders, hips and knees.  Skin: Intact, no ulcerations or rash noted.  Psych: Good eye contact, normal affect. Memory impaired,  depressed appearing.  CNS: CN 2-12 intact, power, tone and sensation normal  throughout.       Assessment & Plan:

## 2011-02-08 NOTE — Assessment & Plan Note (Addendum)
Uncontrolled, impt of regular daily dose of insulin stresed, diet is very erratic and unpredictable

## 2011-02-08 NOTE — Assessment & Plan Note (Signed)
Unchanged, no plan to quit, counseled to do so

## 2011-02-08 NOTE — Assessment & Plan Note (Signed)
Reports deterioration will resume prozac

## 2011-02-08 NOTE — Assessment & Plan Note (Signed)
Unchanged, poor social circumstances contribute also

## 2011-05-15 ENCOUNTER — Encounter (HOSPITAL_COMMUNITY): Payer: Self-pay | Admitting: *Deleted

## 2011-05-15 ENCOUNTER — Emergency Department (HOSPITAL_COMMUNITY): Payer: Medicaid Other

## 2011-05-15 ENCOUNTER — Emergency Department (HOSPITAL_COMMUNITY)
Admission: EM | Admit: 2011-05-15 | Discharge: 2011-05-15 | Disposition: A | Discharge status: Home / Self Care | Payer: Medicaid Other | Attending: Emergency Medicine | Admitting: Emergency Medicine

## 2011-05-15 DIAGNOSIS — I10 Essential (primary) hypertension: Secondary | ICD-10-CM | POA: Insufficient documentation

## 2011-05-15 DIAGNOSIS — E119 Type 2 diabetes mellitus without complications: Secondary | ICD-10-CM | POA: Insufficient documentation

## 2011-05-15 DIAGNOSIS — F17200 Nicotine dependence, unspecified, uncomplicated: Secondary | ICD-10-CM | POA: Insufficient documentation

## 2011-05-15 DIAGNOSIS — J3489 Other specified disorders of nose and nasal sinuses: Secondary | ICD-10-CM | POA: Insufficient documentation

## 2011-05-15 DIAGNOSIS — J069 Acute upper respiratory infection, unspecified: Secondary | ICD-10-CM | POA: Insufficient documentation

## 2011-05-15 DIAGNOSIS — F341 Dysthymic disorder: Secondary | ICD-10-CM | POA: Insufficient documentation

## 2011-05-15 LAB — GLUCOSE, CAPILLARY: Glucose-Capillary: 265 mg/dL — ABNORMAL HIGH (ref 70–99)

## 2011-05-15 MED ORDER — GUAIFENESIN-CODEINE 100-10 MG/5ML PO SYRP: ORAL_SOLUTION | ORAL | Status: DC

## 2011-05-15 NOTE — ED Notes (Signed)
C/o sore throat and nasal congestion

## 2011-05-15 NOTE — ED Provider Notes (Signed)
Medical screening examination/treatment/procedure(s) were performed by non-physician practitioner and as supervising physician I was immediately available for consultation/collaboration.  Carmin Muskrat, MD 05/15/11 2059

## 2011-05-15 NOTE — ED Provider Notes (Signed)
History     CSN: [id number]  Arrival date & time 05/15/11  1607   First MD Initiated Contact with Patient 05/15/11 1811      Chief Complaint  Patient presents with  . Nasal Congestion    (Consider location/radiation/quality/duration/timing/severity/associated sxs/prior treatment) HPI Comments: "my nose is stopped up and running".  Sneezing.  NP cough and subjective fever.  Has taken no meds for sxs.  The history is provided by the patient. No language interpreter was used.    Past Medical History  Diagnosis Date  . Diabetes mellitus   . Depression   . Anxiety   . Hypertension   . Substance abuse     HX of drug use and alcohol use  . Hepatitis C     Past Surgical History  Procedure Date  . Appendectomy 2004    Family History  Problem Relation Age of Onset  . Diabetes Sister     Twin - AIDS     History  Substance Use Topics  . Smoking status: Current Everyday Smoker -- 0.2 packs/day    Types: Cigarettes  . Smokeless tobacco: Not on file  . Alcohol Use: Yes    OB History    Grav Para Term Preterm Abortions TAB SAB Ect Mult Living                  Review of Systems  Constitutional: Positive for fever.  HENT: Positive for congestion, rhinorrhea and sneezing.   Respiratory: Positive for cough. Negative for wheezing and stridor.   All other systems reviewed and are negative.    Allergies  Penicillins  Home Medications   Current Outpatient Rx  Name Route Sig Dispense Refill  . GLUCOSE BLOOD VI STRP  Use as instructed for once daily testing  Dx 250.00 100 each 11  . INSULIN GLARGINE 100 UNIT/ML  SOLN Subcutaneous Inject 30 Units into the skin daily.      BP 158/83  Pulse 90  Temp(Src) 98.3 F (36.8 C) (Oral)  Resp 20  Ht 5\' 7"  (1.702 m)  Wt 152 lb (68.947 kg)  BMI 23.81 kg/m2  SpO2 100%  LMP 06/23/2010  Physical Exam  Nursing note and vitals reviewed. Constitutional: She is oriented to person, place, and time. She appears  well-developed and well-nourished. No distress.  HENT:  Head: Normocephalic and atraumatic.  Nose: Rhinorrhea present. Right sinus exhibits no maxillary sinus tenderness and no frontal sinus tenderness. Left sinus exhibits no maxillary sinus tenderness and no frontal sinus tenderness.  Mouth/Throat: Oropharynx is clear and moist. No oropharyngeal exudate.  Eyes: EOM are normal.  Neck: Normal range of motion.  Cardiovascular: Normal rate, regular rhythm and normal heart sounds.   Pulmonary/Chest: Effort normal and breath sounds normal. No accessory muscle usage. Not tachypneic. No respiratory distress. She has no decreased breath sounds. She has no wheezes. She has no rhonchi. She has no rales. She exhibits no tenderness.  Abdominal: Soft. She exhibits no distension. There is no tenderness.  Musculoskeletal: Normal range of motion.  Lymphadenopathy:    She has no cervical adenopathy.  Neurological: She is alert and oriented to person, place, and time.  Skin: Skin is warm and dry.  Psychiatric: She has a normal mood and affect. Judgment normal.    ED Course  Procedures (including critical care time)  Labs Reviewed  GLUCOSE, CAPILLARY - Abnormal; Notable for the following:    Glucose-Capillary 265 (*)    All other components within normal limits   No  results found.   No diagnosis found.    [place], [place] 05/15/11 [phone]

## 2011-05-15 NOTE — ED Notes (Signed)
Pt DC TO HOME WITH STEADY GAIT

## 2011-05-15 NOTE — Discharge Instructions (Signed)
Antibiotic Nonuse  Your caregiver felt that the infection or problem was not one that would be helped with an antibiotic. Infections may be caused by viruses or bacteria. Only a caregiver can tell which one of these is the likely cause of an illness. A cold is the most common cause of infection in both adults and children. A cold is a virus. Antibiotic treatment will have no effect on a viral infection. Viruses can lead to many lost days of work caring for sick children and many missed days of school. Children may catch as many as 10 "colds" or "flus" per year during which they can be tearful, cranky, and uncomfortable. The goal of treating a virus is aimed at keeping the ill person comfortable. Antibiotics are medications used to help the body fight bacterial infections. There are relatively few types of bacteria that cause infections but there are hundreds of viruses. While both viruses and bacteria cause infection they are very different types of germs. A viral infection will typically go away by itself within 7 to 10 days. Bacterial infections may spread or get worse without antibiotic treatment. Examples of bacterial infections are:  Sore throats (like strep throat or tonsillitis).   Infection in the lung (pneumonia).   Ear and skin infections.  Examples of viral infections are:  Colds or flus.   Most coughs and bronchitis.   Sore throats not caused by Strep.   Runny noses.  It is often best not to take an antibiotic when a viral infection is the cause of the problem. Antibiotics can kill off the helpful bacteria that we have inside our body and allow harmful bacteria to start growing. Antibiotics can cause side effects such as allergies, nausea, and diarrhea without helping to improve the symptoms of the viral infection. Additionally, repeated uses of antibiotics can cause bacteria inside of our body to become resistant. That resistance can be passed onto harmful bacterial. The next time  you have an infection it may be harder to treat if antibiotics are used when they are not needed. Not treating with antibiotics allows our own immune system to develop and take care of infections more efficiently. Also, antibiotics will work better for [place] when they are prescribed for bacterial infections. Treatments for a child that is ill may include:  Give extra fluids throughout the day to stay hydrated.   Get plenty of rest.   Only give your child over-the-counter or prescription medicines for pain, discomfort, or fever as directed by your caregiver.   The use of a cool mist humidifier may help stuffy noses.   Cold medications if suggested by your caregiver.  Your caregiver may decide to start you on an antibiotic if:  The problem you were seen for today continues for a longer length of time than expected.   You develop a secondary bacterial infection.  SEEK MEDICAL CARE IF:  Fever lasts longer than 5 days.   Symptoms continue to get worse after 5 to 7 days or become severe.   Difficulty in breathing develops.   Signs of dehydration develop (poor drinking, rare urinating, dark colored urine).   Changes in behavior or worsening tiredness (listlessness or lethargy).  Document Released: 05/18/2001 Document Revised: 11/19/2010 Document Reviewed: 11/14/2008 [facility] Patient Information 2012 [place].   The chest x-ray shows no signs of pneumonia.  Take the cough medicine as directed.  Take sudafed for nasal congestion.  Follow up with your MD as needed.

## 2011-05-20 ENCOUNTER — Encounter: Payer: Self-pay | Admitting: Family Medicine

## 2011-05-20 ENCOUNTER — Ambulatory Visit (INDEPENDENT_AMBULATORY_CARE_PROVIDER_SITE_OTHER): Payer: Medicaid Other | Admitting: Family Medicine

## 2011-05-20 VITALS — BP 120/88 | HR 107 | Temp 98.4°F | Resp 16 | Ht 67.5 in | Wt 145.4 lb

## 2011-05-20 DIAGNOSIS — R5381 Other malaise: Secondary | ICD-10-CM

## 2011-05-20 DIAGNOSIS — Z9119 Patient's noncompliance with other medical treatment and regimen: Secondary | ICD-10-CM

## 2011-05-20 DIAGNOSIS — J329 Chronic sinusitis, unspecified: Secondary | ICD-10-CM

## 2011-05-20 DIAGNOSIS — F1010 Alcohol abuse, uncomplicated: Secondary | ICD-10-CM

## 2011-05-20 DIAGNOSIS — E119 Type 2 diabetes mellitus without complications: Secondary | ICD-10-CM

## 2011-05-20 DIAGNOSIS — F17200 Nicotine dependence, unspecified, uncomplicated: Secondary | ICD-10-CM

## 2011-05-20 MED ORDER — DOXYCYCLINE HYCLATE 100 MG PO TABS: 100.0000 mg | ORAL_TABLET | Freq: Two times a day (BID) | ORAL | Status: AC

## 2011-05-20 MED ORDER — SULFAMETHOXAZOLE-TRIMETHOPRIM 800-160 MG PO TABS: 1.0000 | ORAL_TABLET | Freq: Two times a day (BID) | ORAL | Status: DC

## 2011-05-20 NOTE — Patient Instructions (Signed)
F/u in 3.5 month  You are being treated for sinusits   You will get a new meter.  You should test twice daily  HBa1C and chem 7 today

## 2011-05-25 NOTE — Assessment & Plan Note (Signed)
Acute infection, antibiotics sent to pharamacy

## 2011-05-25 NOTE — Assessment & Plan Note (Signed)
Uncontrolled due to ongoing non compliance with treatment plan

## 2011-05-30 NOTE — Progress Notes (Signed)
  Subjective:    Patient ID: Latoya Walsh, female    DOB: 1960-12-17, 51 y.o.   MRN: [id number]  HPI The PT is here for follow up and re-evaluation of chronic medical conditions, medication management and review of any available recent lab and radiology data.  Preventive health is updated, specifically  Cancer screening and Immunization.   Questions or concerns regarding consultations or procedures which the PT has had in the interim are  addressed. The PT remains non compliant with medication C/o head congestion with green nasal drainage, seen in Ed but has not bought the medication  Non compliant with medication, testing or appropriate diet for her diabetes    Review of Systems See HPI Denies chest congestion, productive cough or wheezing. Denies chest pains, palpitations and leg swelling Denies abdominal pain, nausea, vomiting,diarrhea or constipation.   Denies dysuria, frequency, hesitancy or incontinence. Denies joint pain, swelling and limitation in mobility. C/o depression and  Anxiety, not suicidal or homicidal, refuses anti depressant mdication Denies skin break down or rash.        Objective:   Physical Exam Patient alert and oriented and in no cardiopulmonary distress.  HEENT: No facial asymmetry, EOMI, maxillary  sinus tenderness,  oropharynx pink , poor dentition.  Neck supple no adenopathy.  Chest: Clear to auscultation bilaterally.  CVS: S1, S2 no murmurs, no S3.  ABD: Soft non tender. Bowel sounds normal.  Ext: No edema  MS: Adequate ROM spine, shoulders, hips and knees.  Skin: Intact, no ulcerations or rash noted.  Psych:normal affect. Memory intact not anxious or depressed appearing.  CNS: CN 2-12 intact, power, tone and sensation normal throughout.        Assessment & Plan:

## 2011-05-30 NOTE — Assessment & Plan Note (Signed)
Encouraged to quit any use

## 2011-05-30 NOTE — Assessment & Plan Note (Signed)
Ongoing problem will attempt to get through community pharmacy program

## 2011-05-30 NOTE — Assessment & Plan Note (Signed)
Unchanged no commitment to quitting

## 2011-06-06 LAB — BASIC METABOLIC PANEL
Calcium: 9.8 mg/dL (ref 8.4–10.5)
Potassium: 4.8 mEq/L (ref 3.5–5.3)
Sodium: 138 mEq/L (ref 135–145)

## 2011-06-06 LAB — HEMOGLOBIN A1C: Hgb A1c MFr Bld: 9.6 % — ABNORMAL HIGH (ref <5.7)

## 2011-06-25 ENCOUNTER — Ambulatory Visit (INDEPENDENT_AMBULATORY_CARE_PROVIDER_SITE_OTHER): Payer: Medicaid Other | Admitting: Family Medicine

## 2011-06-25 ENCOUNTER — Encounter: Payer: Self-pay | Admitting: Family Medicine

## 2011-06-25 VITALS — BP 140/98 | HR 72 | Resp 16 | Ht 67.5 in | Wt 144.0 lb

## 2011-06-25 DIAGNOSIS — I10 Essential (primary) hypertension: Secondary | ICD-10-CM

## 2011-06-25 DIAGNOSIS — F17200 Nicotine dependence, unspecified, uncomplicated: Secondary | ICD-10-CM

## 2011-06-25 DIAGNOSIS — E119 Type 2 diabetes mellitus without complications: Secondary | ICD-10-CM

## 2011-06-25 MED ORDER — METFORMIN HCL 500 MG PO TABS: 500.0000 mg | ORAL_TABLET | Freq: Two times a day (BID) | ORAL | Status: DC

## 2011-06-25 MED ORDER — RAMIPRIL 5 MG PO CAPS: 5.0000 mg | ORAL_CAPSULE | Freq: Every day | ORAL | Status: DC

## 2011-06-25 MED ORDER — INSULIN GLARGINE 100 UNIT/ML ~~LOC~~ SOLN: 40.0000 [IU] | Freq: Every day | SUBCUTANEOUS | Status: DC

## 2011-06-25 NOTE — Patient Instructions (Addendum)
F/u in 6 weeks.Please bring log book again  You will get information on jow to eat as a diabetic, and there a re free group classes at the hospital which you can attend  Your blood sugar is uncontrolled.  Increase lantus to 45 units once daily, and also start metformin 500mg  one twice daily  Your blood pressure is high , start ramipril 5mg  one daily.  You ar being referred to social services since you have so much difficulty taking care of your health.  I will be contacting a new pharmacy to provide your medication regularly.  Your daughter will also be used as a contact, you need to sign permission for this and ensure we have her name and telephone contact information

## 2011-06-25 NOTE — Progress Notes (Signed)
  Subjective:    Patient ID: Latoya Walsh, female    DOB: 13-Jul-1960, 51 y.o.   MRN: [id number]  HPI Pt is in for f/u accompanied by her daughter. She has no expressed concerns, her blood sugar record shows fasting blood sugars averaging 150 to 170. She denies polyuria, polydipsia or blurred vision.She reports often not having either medication or supplies for testing. She remains essentially non compliant and obviously is in need of guardianship/social worker involvement to improve her health . She agrees   Review of Systems See HPI Denies recent fever or chills. Denies sinus pressure, nasal congestion, ear pain or sore throat. Denies chest congestion, productive cough or wheezing. Denies chest pains, palpitations and leg swelling Denies abdominal pain, nausea, vomiting,diarrhea or constipation.   Denies dysuria, frequency, hesitancy or incontinence. Denies uncontrolled  depression, anxiety or insomnia. Denies skin break down or rash.        Objective:   Physical Exam Patient alert and oriented and in no cardiopulmonary distress.  HEENT: No facial asymmetry, EOMI, no sinus tenderness,  oropharynx pink and moist.  Neck supple no adenopathy.  Chest: Clear to auscultation bilaterally.Decreased air entry throughout  CVS: S1, S2 no murmurs, no S3.  ABD: Soft non tender. Bowel sounds normal.  Ext: No edema  MS: Adequate ROM spine, shoulders, hips and knees.  Skin: Intact, no ulcerations or rash noted.  Psych: Poor  eye contact, normal affect. Memory loss not anxious or depressed appearing.  CNS: CN 2-12 intact, power,  normal throughout.        Assessment & Plan:

## 2011-06-28 NOTE — Assessment & Plan Note (Signed)
Uncontrolled, will refer t social services and also a pharmacy program which will help with medication compliance

## 2011-06-28 NOTE — Assessment & Plan Note (Signed)
Uncontrolled, pt to start medication, she is non compliant

## 2011-06-28 NOTE — Assessment & Plan Note (Signed)
unchnaged °

## 2011-07-29 ENCOUNTER — Ambulatory Visit: Payer: Medicaid Other | Admitting: Family Medicine

## 2011-08-21 ENCOUNTER — Ambulatory Visit (INDEPENDENT_AMBULATORY_CARE_PROVIDER_SITE_OTHER): Payer: Medicaid Other | Admitting: Family Medicine

## 2011-08-21 ENCOUNTER — Encounter: Payer: Self-pay | Admitting: Family Medicine

## 2011-08-21 VITALS — BP 140/90 | HR 76 | Resp 18 | Ht 67.5 in | Wt 141.0 lb

## 2011-08-21 DIAGNOSIS — F329 Major depressive disorder, single episode, unspecified: Secondary | ICD-10-CM

## 2011-08-21 DIAGNOSIS — G4700 Insomnia, unspecified: Secondary | ICD-10-CM

## 2011-08-21 DIAGNOSIS — F1920 Other psychoactive substance dependence, uncomplicated: Secondary | ICD-10-CM

## 2011-08-21 DIAGNOSIS — B1710 Acute hepatitis C without hepatic coma: Secondary | ICD-10-CM

## 2011-08-21 DIAGNOSIS — F17200 Nicotine dependence, unspecified, uncomplicated: Secondary | ICD-10-CM

## 2011-08-21 DIAGNOSIS — I10 Essential (primary) hypertension: Secondary | ICD-10-CM

## 2011-08-21 DIAGNOSIS — F3289 Other specified depressive episodes: Secondary | ICD-10-CM

## 2011-08-21 DIAGNOSIS — E119 Type 2 diabetes mellitus without complications: Secondary | ICD-10-CM

## 2011-08-21 LAB — GLUCOSE, POCT (MANUAL RESULT ENTRY): POC Glucose: 139 mg/dl — AB (ref 70–99)

## 2011-08-21 MED ORDER — METFORMIN HCL 500 MG PO TABS: 500.0000 mg | ORAL_TABLET | Freq: Three times a day (TID) | ORAL | Status: DC

## 2011-08-21 MED ORDER — MIRTAZAPINE 15 MG PO TABS: 15.0000 mg | ORAL_TABLET | Freq: Every day | ORAL | Status: DC

## 2011-08-21 NOTE — Patient Instructions (Signed)
F/u in 4 weeks.  INCREASE metformin to one table THREE times daily  Start remeron ONE at bedtime for sleep/.  Call [facility] for appointment for treatment with depression  [facility] for help with Drug addiction.  Continue to have your social worker go to all appointments.  Make sure you take medication as prescribed.  Do not take insulin, keep in fridge  HBA1C and chem 7 in 4 weeks BEFORE visit

## 2011-08-21 NOTE — Progress Notes (Signed)
  Subjective:    Patient ID: Latoya Walsh, female    DOB: 07/07/1960, 51 y.o.   MRN: [id number]  HPI  The PT is here for follow up and re-evaluation of chronic medical conditions, medication management and review of any available recent lab and radiology data.  Preventive health is updated, specifically  Cancer screening and Immunization.   The PT denies any adverse reactions to current medications since the last visit.  She comes in with a Education officer, museum who is now involved in her care, which is a great thing.She has her medication, has not been taking insulin since, based on her record, morning sugars are seldom over 150 without it scared she will 'bottom out" Still using cocaine, willing to go to rehab, tired of being tired. Wants to go to mental health for depression, not suicidal or homicidal, c/o poor sleep      Review of Systems See HPI Denies recent fever or chills. Denies sinus pressure, nasal congestion, ear pain or sore throat. Denies chest congestion, productive cough or wheezing. Denies chest pains, palpitations and leg swelling Denies abdominal pain, nausea, vomiting,diarrhea or constipation.   Denies dysuria, frequency, hesitancy or incontinence. Denies joint pain, swelling and limitation in mobility. Denies headaches, seizures, numbness, or tingling.  Denies skin break down or rash.        Objective:   Physical Exam  Patient alert and oriented and in no cardiopulmonary distress.  HEENT: No facial asymmetry, EOMI, no sinus tenderness,  oropharynx pink and moist.  Neck supple no adenopathy.Poor dentition  Chest: Clear to auscultation bilaterally.Decreased air entry throughout  CVS: S1, S2 no murmurs, no S3.  ABD: Soft non tender. Bowel sounds normal.  Ext: No edema  MS: Adequate ROM spine, shoulders, hips and knees.  Skin: Intact, no ulcerations or rash noted.  Psych: Poor  eye contact, normal affect. Memory intact not anxious or depressed  appearing.  CNS: CN 2-12 intact, power, tone and sensation normal throughout.       Assessment & Plan:

## 2011-08-22 LAB — BASIC METABOLIC PANEL
Calcium: 9.5 mg/dL (ref 8.4–10.5)
Creat: 0.72 mg/dL (ref 0.50–1.10)
Sodium: 138 mEq/L (ref 135–145)

## 2011-08-22 NOTE — Assessment & Plan Note (Signed)
Uncontrolled, but based on data brought to office seem to be improved,will f/u HBA1C when next due

## 2011-08-22 NOTE — Assessment & Plan Note (Signed)
Has agreed to go to rehab , with the support of social worker , this is a greater possibility

## 2011-08-22 NOTE — Assessment & Plan Note (Signed)
Not suicidal or hiomicidal but easily tearful and wants treatment, she will call for appt

## 2011-08-22 NOTE — Assessment & Plan Note (Signed)
Uncontrolled, but recently used cocaine, also med compliance questionable, she is being referred for weekly med packaging through home health

## 2011-08-22 NOTE — Assessment & Plan Note (Signed)
Unchanged , cessation counseling done 

## 2011-08-22 NOTE — Assessment & Plan Note (Signed)
Now that social worker is involved, the [possibilty that she will keep GI appt is great, so will refer for eval

## 2011-08-22 NOTE — Assessment & Plan Note (Signed)
Deteriorated, sleep hygiene discussed, start restoril

## 2011-08-23 LAB — HEMOGLOBIN A1C
Hgb A1c MFr Bld: 8.4 % — ABNORMAL HIGH (ref <5.7)
Mean Plasma Glucose: 194 mg/dL — ABNORMAL HIGH (ref <117)

## 2011-08-31 ENCOUNTER — Telehealth: Payer: Self-pay | Admitting: Family Medicine

## 2011-08-31 NOTE — Telephone Encounter (Signed)
pls contact and give verbal order on medications for this pt to Wyatt Haste , the nurse  [facility] who sent a paper with med questions.  Form is at your area, pls follow verbal orders with faxing them and leave  the form to be scanned pleaseThank you

## 2011-09-02 NOTE — Telephone Encounter (Signed)
Completed per Loma Sousa

## 2011-09-17 ENCOUNTER — Telehealth: Payer: Self-pay

## 2011-09-17 DIAGNOSIS — E119 Type 2 diabetes mellitus without complications: Secondary | ICD-10-CM

## 2011-09-17 NOTE — Telephone Encounter (Signed)
pls order HBA1C and chem 7 for early September

## 2011-09-17 NOTE — Telephone Encounter (Signed)
done

## 2011-10-01 ENCOUNTER — Telehealth: Payer: Self-pay | Admitting: Family Medicine

## 2011-10-05 NOTE — Telephone Encounter (Signed)
Phone has been disconnected 

## 2011-10-06 NOTE — Telephone Encounter (Signed)
She states she did not call and does not need anything

## 2011-10-08 ENCOUNTER — Encounter (INDEPENDENT_AMBULATORY_CARE_PROVIDER_SITE_OTHER): Payer: Self-pay | Admitting: Internal Medicine

## 2011-10-08 ENCOUNTER — Ambulatory Visit (INDEPENDENT_AMBULATORY_CARE_PROVIDER_SITE_OTHER): Payer: Medicaid Other | Admitting: Internal Medicine

## 2011-10-08 VITALS — BP 118/78 | HR 72 | Temp 97.9°F | Ht 67.0 in | Wt 138.7 lb

## 2011-10-08 DIAGNOSIS — B1920 Unspecified viral hepatitis C without hepatic coma: Secondary | ICD-10-CM

## 2011-10-08 NOTE — Patient Instructions (Addendum)
I do not think she is a candidate for Hep C treatment. I will however discuss with Dr. Laural Golden.  Hep C quant. PCR

## 2011-10-08 NOTE — Progress Notes (Signed)
Subjective:     Patient ID: Latoya Walsh, female   DOB: [birth year], 51 y.o.   MRN: [id number]  HPIReferred by Dr. [name] for Hepatitis C. She cannot tell me when she was diagnosed with Hepatitis C. She does not have any tattoos. No IV drugs. She denies multiple sex partners.  Appetite is not good. No weight loss. BM x 1 a day. No bright red rectal bleeding or melena. Patient is non-compliant with her medications per Dr. Griffin Dakin notes.. At present, she is not taking. She continues to use crack cocaine. Hx of depression. She does drink 2 twelve oz beers a day.  Review of Systems see hpi Current Outpatient Prescriptions  Medication Sig Dispense Refill  . glucose blood (PRODIGY TEST) test strip Use as instructed for once daily testing  Dx 250.00  100 each  11  . metFORMIN (GLUCOPHAGE) 500 MG tablet Take 1 tablet (500 mg total) by mouth 3 (three) times daily with meals.  90 tablet  11  . mirtazapine (REMERON) 15 MG tablet Take 1 tablet (15 mg total) by mouth at bedtime.  30 tablet  2  . ramipril (ALTACE) 5 MG capsule Take 1 capsule (5 mg total) by mouth daily.  30 capsule  11   Past Medical History  Diagnosis Date  . Diabetes mellitus   . Depression   . Anxiety   . Hypertension   . Substance abuse     HX of drug use and alcohol use  . Hepatitis C    . Past Surgical History  Procedure Date  . Appendectomy 2004   History   Social History  . Marital Status: Married    Spouse Name: N/A    Number of Children: 3  . Years of Education: N/A   Occupational History  . Disabled    Social History Main Topics  . Smoking status: Current Everyday Smoker -- 0.2 packs/day    Types: Cigarettes  . Smokeless tobacco: Not on file   Comment: 1/2 pack x 40 yrs  . Alcohol Use: Yes     2 16oz cans of beer a day  . Drug Use: No     Quit. Crack cocaine  . Sexually Active: Not on file   Other Topics Concern  . Not on file   Social History Narrative  . No narrative on file   Family  Status  Relation Status Death Age  . Sister Alive     HIV  . Mother Deceased   . Father Deceased   . Sister Alive     x3  . Brother Alive     x2   Allergies  Allergen Reactions  . Penicillins Itching and Rash        Objective:   Physical Exam Filed Vitals:   10/08/11 1020  Height: 5\' 7"  (1.702 m)  Weight: 138 lb 11.2 oz (62.914 kg)   Alert and oriented. Skin warm and dry. Oral mucosa is moist.   . Sclera anicteric, conjunctivae is pink. Thyroid not enlarged. No cervical lymphadenopathy. Lungs clear. Heart regular rate and rhythm.  Abdomen is soft. Bowel sounds are positive. No hepatomegaly. No abdominal masses felt. No tenderness.  No edema to lower extremities.  CBC    Component Value Date/Time   WBC 5.2 03/03/2010 2030   RBC 4.45 03/03/2010 2030   HGB 14.2 03/03/2010 2030   HCT 41.0 03/03/2010 2030   PLT 121* 03/03/2010 2030   MCV 92.1 03/03/2010 2030   MCHC 34.6 03/03/2010 2030  RDW 11.7 03/03/2010 2030   LYMPHSABS 2.9 03/03/2010 2030   MONOABS 0.6 03/03/2010 2030   EOSABS 0.1 03/03/2010 2030   BASOSABS 0.1 03/03/2010 2030   BMET    Component Value Date/Time   NA 138 08/21/2011 1122   K 4.2 08/21/2011 1122   CL 105 08/21/2011 1122   CO2 25 08/21/2011 1122   GLUCOSE 176* 08/21/2011 1122   BUN 13 08/21/2011 1122   CREATININE 0.72 08/21/2011 1122   CREATININE 0.73 03/03/2010 2030   CALCIUM 9.5 08/21/2011 1122   GFRNONAA >60 11/16/2008 1150   GFRAA  Value: >60        The eGFR has been calculated using the MDRD equation. This calculation has not been validated in all clinical situations. eGFR's persistently <60 mL/min signify possible Chronic Kidney Disease. 11/16/2008 1150          Assessment:   Hepatitis C. Cocaine abuse.  Continues to drink etoh.She is non-compliant with her medications. I have had a hard time today trying to illicit a history from her. She has a hx of depression and she tells me she has had outpatient treatment at [facility].  I do not  believe that this patient is a candidate for Hep C. Treatment. I will however get a Hep. C. Quant. On her today. I will discuss with Dr. Laural Golden.    Plan:   Hepatitis C quant. PCR. Further recommendations once we have lab back.

## 2011-11-25 ENCOUNTER — Ambulatory Visit: Payer: Medicaid Other | Admitting: Family Medicine

## 2011-12-02 ENCOUNTER — Encounter: Payer: Self-pay | Admitting: Family Medicine

## 2011-12-02 ENCOUNTER — Ambulatory Visit (INDEPENDENT_AMBULATORY_CARE_PROVIDER_SITE_OTHER): Payer: Medicaid Other | Admitting: Family Medicine

## 2011-12-02 ENCOUNTER — Other Ambulatory Visit (HOSPITAL_COMMUNITY)
Admission: RE | Admit: 2011-12-02 | Discharge: 2011-12-02 | Disposition: A | Discharge status: Home / Self Care | Payer: Medicaid Other | Source: Ambulatory Visit | Attending: Family Medicine | Admitting: Family Medicine

## 2011-12-02 VITALS — BP 128/74 | HR 75 | Resp 16 | Wt 141.0 lb

## 2011-12-02 DIAGNOSIS — Z124 Encounter for screening for malignant neoplasm of cervix: Secondary | ICD-10-CM

## 2011-12-02 DIAGNOSIS — Z23 Encounter for immunization: Secondary | ICD-10-CM

## 2011-12-02 DIAGNOSIS — Z01419 Encounter for gynecological examination (general) (routine) without abnormal findings: Secondary | ICD-10-CM | POA: Insufficient documentation

## 2011-12-02 DIAGNOSIS — Z1211 Encounter for screening for malignant neoplasm of colon: Secondary | ICD-10-CM

## 2011-12-02 DIAGNOSIS — E119 Type 2 diabetes mellitus without complications: Secondary | ICD-10-CM

## 2011-12-02 DIAGNOSIS — Z0000 Encounter for general adult medical examination without abnormal findings: Secondary | ICD-10-CM

## 2011-12-02 NOTE — Patient Instructions (Addendum)
F/u in mid December.  Flu vaccine today.  HBa1C and cmp and EGFR today

## 2011-12-02 NOTE — Assessment & Plan Note (Signed)
Annual exam performed. Pt needs dental work , but is resistant at this  time. Needs to stop smoking and substance abuse, no commitment at this time Pap sent. Rectal exam negative for mass and no blood in stool

## 2011-12-02 NOTE — Progress Notes (Signed)
  Subjective:    Patient ID: Latoya Walsh, female    DOB: December 24, 1960, 51 y.o.   MRN: [id number]  HPI Pt in for annual exam. No new concerns at this time. States her blood sugars fluctuate a lot and she has some difficulty with administration of the insulin with her pen, requesting be changed to vial   Review of Systems See HPI Denies recent fever or chills. Denies sinus pressure, nasal congestion, ear pain or sore throat. Denies chest congestion, productive cough or wheezing. Denies chest pains, palpitations and leg swelling Denies abdominal pain, nausea, vomiting,diarrhea or constipation.   Denies dysuria, frequency, hesitancy or incontinence. Denies joint pain, swelling and limitation in mobility. Denies headaches, seizures, numbness, or tingling. Denies uncontrolled  depression, anxiety or insomnia.Was "not at home" after appt was set up with mental health Denies skin break down or rash.        Objective:   Physical Exam  Pleasant well nourished female, alert and oriented x 3, in no cardio-pulmonary distress. Afebrile. HEENT No facial trauma or asymetry. Sinuses non tender.  EOMI, PERTL,   External ears normal, tympanic membranes clear. Oropharynx moist, no exudate, poor dentition. Neck: supple, no adenopathy,JVD or thyromegaly.No bruits.  Chest: Clear to ascultation bilaterally.No crackles or wheezes. Non tender to palpation  Breast: No asymetry,no masses. No nipple discharge or inversion. No axillary or supraclavicular adenopathy  Cardiovascular system; Heart sounds normal,  S1 and  S2 ,no S3.  No murmur, or thrill. Apical beat not displaced Peripheral pulses normal.  Abdomen: Soft, non tender, no organomegaly or masses. No bruits. Bowel sounds normal. No guarding, tenderness or rebound.  Rectal:  No mass. Guaiac negative stool.  GU: External genitalia normal. No lesions. Vaginal canal normal.No discharge. Uterus normal size, no adnexal masses,  no cervical motion or adnexal tenderness.  Musculoskeletal exam: Full ROM of spine, hips , shoulders and knees. No deformity ,swelling or crepitus noted. No muscle wasting or atrophy.   Neurologic: Cranial nerves 2 to 12 intact. Power, tone ,sensation and reflexes normal throughout. No disturbance in gait. No tremor.  Skin: Intact,  Pigmentation normal throughout  Psych; Normal mood and affect. Judgement and concentration normal Diabetic Foot Check:  Appearance -  calluses Skin - no unusual pallor or redness sonychomycosis bilaterally Sensation - grossly intact to light touch Monofilament testing -  Right - Great toe, medial, central, lateral ball and posterior foot diminished Left - Great toe, medial, central, lateral ball and posterior foot diminished Pulses Left - Dorsalis Pedis and Posterior Tibia normal Right - Dorsalis Pedis and Posterior Tibia normal       Assessment & Plan:

## 2011-12-04 LAB — COMPLETE METABOLIC PANEL WITH GFR
ALT: 101 U/L — ABNORMAL HIGH (ref 0–35)
AST: 76 U/L — ABNORMAL HIGH (ref 0–37)
Albumin: 4 g/dL (ref 3.5–5.2)
CO2: 26 mEq/L (ref 19–32)
Calcium: 9.4 mg/dL (ref 8.4–10.5)
Chloride: 105 mEq/L (ref 96–112)
Creat: 0.85 mg/dL (ref 0.50–1.10)
GFR, Est African American: >89 mL/min
Potassium: 4.3 mEq/L (ref 3.5–5.3)
Sodium: 139 mEq/L (ref 135–145)
Total Protein: 7.1 g/dL (ref 6.0–8.3)

## 2011-12-04 LAB — HEMOGLOBIN A1C
Hgb A1c MFr Bld: 7.4 % — ABNORMAL HIGH (ref <5.7)
Mean Plasma Glucose: 166 mg/dL — ABNORMAL HIGH (ref <117)

## 2011-12-05 ENCOUNTER — Other Ambulatory Visit: Payer: Self-pay | Admitting: Family Medicine

## 2011-12-23 ENCOUNTER — Other Ambulatory Visit: Payer: Self-pay

## 2011-12-23 ENCOUNTER — Ambulatory Visit: Payer: Medicaid Other | Admitting: Family Medicine

## 2011-12-23 MED ORDER — METFORMIN HCL 500 MG PO TABS: ORAL_TABLET | ORAL | Status: DC

## 2012-03-04 ENCOUNTER — Other Ambulatory Visit: Payer: Self-pay | Admitting: Family Medicine

## 2012-03-04 ENCOUNTER — Ambulatory Visit (INDEPENDENT_AMBULATORY_CARE_PROVIDER_SITE_OTHER): Payer: Medicaid Other | Admitting: Family Medicine

## 2012-03-04 ENCOUNTER — Telehealth: Payer: Self-pay | Admitting: Family Medicine

## 2012-03-04 ENCOUNTER — Encounter: Payer: Self-pay | Admitting: Family Medicine

## 2012-03-04 VITALS — BP 126/72 | HR 88 | Resp 18 | Ht 66.0 in | Wt 144.0 lb

## 2012-03-04 DIAGNOSIS — Z139 Encounter for screening, unspecified: Secondary | ICD-10-CM

## 2012-03-04 DIAGNOSIS — I10 Essential (primary) hypertension: Secondary | ICD-10-CM

## 2012-03-04 DIAGNOSIS — F411 Generalized anxiety disorder: Secondary | ICD-10-CM

## 2012-03-04 DIAGNOSIS — F32A Depression, unspecified: Secondary | ICD-10-CM

## 2012-03-04 DIAGNOSIS — F329 Major depressive disorder, single episode, unspecified: Secondary | ICD-10-CM

## 2012-03-04 DIAGNOSIS — F17200 Nicotine dependence, unspecified, uncomplicated: Secondary | ICD-10-CM

## 2012-03-04 DIAGNOSIS — B1710 Acute hepatitis C without hepatic coma: Secondary | ICD-10-CM

## 2012-03-04 DIAGNOSIS — E119 Type 2 diabetes mellitus without complications: Secondary | ICD-10-CM

## 2012-03-04 MED ORDER — RAMIPRIL 5 MG PO CAPS: 5.0000 mg | ORAL_CAPSULE | Freq: Every day | ORAL | Status: DC

## 2012-03-04 MED ORDER — GLUCOSE BLOOD VI STRP: ORAL_STRIP | Status: DC

## 2012-03-04 MED ORDER — METFORMIN HCL 500 MG PO TABS: ORAL_TABLET | ORAL | Status: DC

## 2012-03-04 MED ORDER — MIRTAZAPINE 15 MG PO TABS: 15.0000 mg | ORAL_TABLET | Freq: Every day | ORAL | Status: DC

## 2012-03-04 NOTE — Patient Instructions (Addendum)
F/u in 3 month  You NEED to take all of your medications as prescribed. I am sending in the metformin take tWO twice daily, and the lantus 10 units Continue depression and sleeping meds as before  Mammogram past due it will be scheduled, this is past due  LABS today, cbc, chem 7 and EGFR, hBA1C

## 2012-03-04 NOTE — Progress Notes (Signed)
  Subjective:    Patient ID: Latoya Walsh, female    DOB: [birth year], 51 y.o.   MRN: [id number]  HPI  The PT is here for follow up and re-evaluation of chronic medical conditions, medication management and review of any available recent lab and radiology data.  Preventive health is updated, specifically  Cancer screening and Immunization.  Mammogram past due will reschedule Was evaluated for hepC by gI but ongoing drug use deemed her ineligible for aggressive care The PT denies any adverse reactions to current medications since the last visit.  Does not have metformin and states unable to "find" the med. Blood sugars have been elevated for the past month, since no medication, will re send , and I stressed the importance of med compliance. In October fasting sugars averaged 130, this past month 180 to 200      Review of Systems See HPI Denies recent fever or chills. Denies sinus pressure, nasal congestion, ear pain or sore throat. Denies chest congestion, productive cough or wheezing. Denies chest pains, palpitations and leg swelling Denies abdominal pain, nausea, vomiting,diarrhea or constipation.   Denies dysuria, frequency, hesitancy or incontinence. Denies joint pain, swelling and limitation in mobility. Denies headaches, seizures, numbness, or tingling. Chronic  depression, anxiety and insomnia, improved with medication Denies skin break down or rash.        Objective:   Physical Exam Patient alert and oriented and in no cardiopulmonary distress.  HEENT: No facial asymmetry, EOMI, no sinus tenderness,  oropharynx pink and moist.  Neck supple no adenopathy.  Chest: Clear to auscultation bilaterally.  CVS: S1, S2 no murmurs, no S3.  ABD: Soft non tender. Bowel sounds normal.  Ext: No edema  MS: Adequate ROM spine, shoulders, hips and knees.  Skin: Intact, no ulcerations or rash noted.  Psych: Good eye contact, normal affect. Memory mildly impaired not anxious or  depressed appearing.  CNS: CN 2-12 intact, power, tone and sensation normal throughout.        Assessment & Plan:

## 2012-03-04 NOTE — Telephone Encounter (Signed)
Patient is aware 

## 2012-03-05 NOTE — Assessment & Plan Note (Addendum)
Uncontrolled due to med non compliance. Lab past due, pt to obtain today. The importance of med compliance stressed

## 2012-03-05 NOTE — Assessment & Plan Note (Signed)
Controlled, no change in medication DASH diet and commitment to daily physical activity for a minimum of 30 minutes discussed and encouraged, as a part of hypertension management. The importance of attaining a healthy weight is also discussed.  

## 2012-03-05 NOTE — Assessment & Plan Note (Signed)
improved

## 2012-03-05 NOTE — Assessment & Plan Note (Signed)
Improved on medication, not suicidal or homicidal

## 2012-03-05 NOTE — Assessment & Plan Note (Signed)
Unchanged. Patient counseled for approximately 5 minutes regarding the health risks of ongoing nicotine use, specifically all types of cancer, heart disease, stroke and respiratory failure. The options available for help with cessation ,the behavioral changes to assist the process, and the option to either gradully reduce usage  Or abruptly stop.is also discussed. Pt is also encouraged to set specific goals in number of cigarettes used daily, as well as to set a quit date.  

## 2012-03-08 ENCOUNTER — Ambulatory Visit (HOSPITAL_COMMUNITY): Payer: Medicaid Other

## 2012-03-14 ENCOUNTER — Other Ambulatory Visit: Payer: Self-pay | Admitting: Family Medicine

## 2012-03-14 DIAGNOSIS — IMO0001 Reserved for inherently not codable concepts without codable children: Secondary | ICD-10-CM

## 2012-03-14 LAB — CBC
HCT: 38.9 % (ref 36.0–46.0)
Hemoglobin: 13.3 g/dL (ref 12.0–15.0)
MCV: 90.5 fL (ref 78.0–100.0)
RBC: 4.3 MIL/uL (ref 3.87–5.11)
RDW: 12.6 % (ref 11.5–15.5)
WBC: 4.6 10*3/uL (ref 4.0–10.5)

## 2012-03-14 LAB — HEMOGLOBIN A1C: Mean Plasma Glucose: 217 mg/dL — ABNORMAL HIGH (ref <117)

## 2012-03-14 LAB — COMPLETE METABOLIC PANEL WITH GFR
ALT: 106 U/L — ABNORMAL HIGH (ref 0–35)
AST: 89 U/L — ABNORMAL HIGH (ref 0–37)
Alkaline Phosphatase: 136 U/L — ABNORMAL HIGH (ref 39–117)
Creat: 0.73 mg/dL (ref 0.50–1.10)
GFR, Est African American: >89 mL/min
Sodium: 140 mEq/L (ref 135–145)
Total Bilirubin: 0.6 mg/dL (ref 0.3–1.2)
Total Protein: 6.5 g/dL (ref 6.0–8.3)

## 2012-03-17 ENCOUNTER — Ambulatory Visit (HOSPITAL_COMMUNITY)
Admission: RE | Admit: 2012-03-17 | Discharge: 2012-03-17 | Disposition: A | Discharge status: Home / Self Care | Payer: Medicaid Other | Source: Ambulatory Visit | Attending: Family Medicine | Admitting: Family Medicine

## 2012-03-17 DIAGNOSIS — Z139 Encounter for screening, unspecified: Secondary | ICD-10-CM

## 2012-03-17 DIAGNOSIS — Z1231 Encounter for screening mammogram for malignant neoplasm of breast: Secondary | ICD-10-CM | POA: Insufficient documentation

## 2012-03-22 ENCOUNTER — Ambulatory Visit (INDEPENDENT_AMBULATORY_CARE_PROVIDER_SITE_OTHER): Payer: Medicaid Other | Admitting: Family Medicine

## 2012-03-22 ENCOUNTER — Encounter: Payer: Self-pay | Admitting: Family Medicine

## 2012-03-22 VITALS — BP 150/100 | HR 137 | Temp 102.9°F | Resp 16 | Wt 142.0 lb

## 2012-03-22 DIAGNOSIS — E119 Type 2 diabetes mellitus without complications: Secondary | ICD-10-CM

## 2012-03-22 DIAGNOSIS — R112 Nausea with vomiting, unspecified: Secondary | ICD-10-CM

## 2012-03-22 DIAGNOSIS — I10 Essential (primary) hypertension: Secondary | ICD-10-CM

## 2012-03-22 DIAGNOSIS — J111 Influenza due to unidentified influenza virus with other respiratory manifestations: Secondary | ICD-10-CM

## 2012-03-22 MED ORDER — ONDANSETRON HCL 4 MG PO TABS: 4.0000 mg | ORAL_TABLET | Freq: Two times a day (BID) | ORAL | Status: AC | PRN

## 2012-03-22 MED ORDER — ONDANSETRON HCL 4 MG/2ML IJ SOLN
4.0000 mg | Freq: Once | INTRAMUSCULAR | Status: AC
  Administered 2012-03-22: 4 mg via INTRAMUSCULAR

## 2012-03-22 MED ORDER — BENZONATATE 100 MG PO CAPS: 100.0000 mg | ORAL_CAPSULE | Freq: Four times a day (QID) | ORAL | Status: DC | PRN

## 2012-03-22 MED ORDER — AZITHROMYCIN 250 MG PO TABS: ORAL_TABLET | ORAL | Status: AC

## 2012-03-22 MED ORDER — OSELTAMIVIR PHOSPHATE 75 MG PO CAPS: 75.0000 mg | ORAL_CAPSULE | Freq: Two times a day (BID) | ORAL | Status: DC

## 2012-03-22 MED ORDER — KETOROLAC TROMETHAMINE 60 MG/2ML IM SOLN
60.0000 mg | Freq: Once | INTRAMUSCULAR | Status: AC
  Administered 2012-03-22: 60 mg via INTRAMUSCULAR

## 2012-03-22 NOTE — Assessment & Plan Note (Signed)
Acute illnesss, no diarrheah. Zofran in office and prescribed

## 2012-03-22 NOTE — Assessment & Plan Note (Signed)
Uncontrolled blood sugar checked in office, no insulin coverage given

## 2012-03-22 NOTE — Assessment & Plan Note (Signed)
Uncontrolled, med not taken for today and pt vomiting

## 2012-03-22 NOTE — Assessment & Plan Note (Signed)
Antibiotic and decongestant prescribed, as well as tamiflu

## 2012-03-22 NOTE — Progress Notes (Signed)
  Subjective:    Patient ID: Latoya Walsh, female    DOB: [birth year], 51 y.o.   MRN: [id number]  HPI 2 day h/o fever, chills and generalized body aches, cough and runny nose. Feels weak, vomitted twice since yesterday, no loose stools. No known sick contact.    Review of Systems See HPI  Denies chest pains, palpitations and leg swelling  Denies dysuria, frequency, hesitancy or incontinence. Denies joint pain, swelling and limitation in mobility. Denies headaches, seizures, numbness, or tingling. Denies depression, anxiety or insomnia. Denies skin break down or rash.        Objective:   Physical Exam Patient alert ill appearing and febrile HEENT: No facial asymmetry, EOMI, no sinus tenderness,  oropharynx pink and moist.  Neck supple bilateral anterior cervical adenopathy.  Chest: decreased air entry, no wheezes, scattered crackles  CVS: S1, S2 no murmurs, no S3.  ABD: Soft non tender. Bowel sounds normal.  Ext: No edema  MS: Adequate ROM spine, shoulders, hips and knees.  Skin: Intact, no ulcerations or rash noted.  Psych: Good eye contact, normal affect. Memory intact not anxious or depressed appearing.  CNS: CN 2-12 intact, power, tone and sensation normal throughout.        Assessment & Plan:

## 2012-03-22 NOTE — Assessment & Plan Note (Signed)
Acute illness, tamiflu prescribed, toradol administered and tylenol given fo generalized aches and fever

## 2012-03-22 NOTE — Patient Instructions (Addendum)
F/u as before.  You are being treated for influenza and bronchitis.  You NEED to take medication as prescribed, pLEASE collect at the pharmacy and take as prescribed, you nEED the medication to get better   If you feel worse you need to go directly to the ED  You have received toradol in the office for generalized pain, and zofran for nausea and vomitting

## 2012-04-13 ENCOUNTER — Ambulatory Visit (INDEPENDENT_AMBULATORY_CARE_PROVIDER_SITE_OTHER): Payer: Medicaid Other | Admitting: Internal Medicine

## 2012-05-02 ENCOUNTER — Ambulatory Visit (INDEPENDENT_AMBULATORY_CARE_PROVIDER_SITE_OTHER): Payer: Medicaid Other | Admitting: Internal Medicine

## 2012-05-02 ENCOUNTER — Encounter (INDEPENDENT_AMBULATORY_CARE_PROVIDER_SITE_OTHER): Payer: Self-pay | Admitting: Internal Medicine

## 2012-05-02 VITALS — BP 100/64 | HR 72 | Temp 98.1°F | Ht 67.0 in | Wt 145.0 lb

## 2012-05-02 DIAGNOSIS — B1920 Unspecified viral hepatitis C without hepatic coma: Secondary | ICD-10-CM

## 2012-05-02 NOTE — Progress Notes (Signed)
Subjective:     Patient ID: Latoya Walsh, female   DOB: 07-15-1960, 52 y.o.   MRN: [id number]  HPI  Here today for f/u of her Hep C. She cannot recall when she was diagnosed with Hep C.  She tells me she is doing okay.  She denies doing drugs. She does smoke.  Appetite is good. No weight loss. BMs are normal. No melena or bright red rectal bleeding. She tells me she has stopped doing crack cocaine. She also tells me she has stopped drinking. For the most part she feels good.  She denies any risk factors for Hep C.  10/08/2011 Hep C RNA:        Value  Range   HCV Quantitative 1903065 (H)   <43 IU/mL   HCV Quantitative Log 6.28 (H)   <1.63 log 10   Comments:  This test utilizes the [place] FDA approved Roche HCV Test Kit by RT-PCR.   Resulting Agency [facility]     Review of Systems Current Outpatient Prescriptions  Medication Sig Dispense Refill  . benzonatate (TESSALON PERLES) 100 MG capsule Take 1 capsule (100 mg total) by mouth every 6 (six) hours as needed for cough.  30 capsule  0  . FLUoxetine (PROZAC) 10 MG capsule Take 10 mg by mouth daily.      [name] glucose blood (PRODIGY TEST) test strip Use as instructed for once daily testing  Dx 250.00  100 each  11  . Insulin Glargine (LANTUS SOLOSTAR [place]) Inject 40 Units into the skin at bedtime.      . metFORMIN (GLUCOPHAGE) 500 MG tablet Dose increase, effective 12/07/2011. Two tablets twice daily  120 tablet  11  . mirtazapine (REMERON) 15 MG tablet Take 1 tablet (15 mg total) by mouth at bedtime.  30 tablet  3  . ramipril (ALTACE) 5 MG capsule Take 1 capsule (5 mg total) by mouth daily.  30 capsule  11   No current facility-administered medications for this visit.   Past Medical History  Diagnosis Date  . Diabetes mellitus   . Depression   . Anxiety   . Hypertension   . Substance abuse     HX of drug use and alcohol use  . Hepatitis C    Past Surgical History  Procedure Laterality Date  . Appendectomy  2004   Allergies   Allergen Reactions  . Penicillins Itching and Rash         Objective:   Physical Exam  Filed Vitals:   05/02/12 0926  BP: 100/64  Pulse: 72  Temp: 98.1 F (36.7 C)  Height: 5\' 7"  (1.702 m)  Weight: 145 lb (65.772 kg)   Alert and oriented. Skin warm and dry. Oral mucosa is moist.   . Sclera anicteric, conjunctivae is pink. Thyroid not enlarged. No cervical lymphadenopathy. Lungs clear. Heart regular rate and rhythm.  Abdomen is soft. Bowel sounds are positive. No hepatomegaly. No abdominal masses felt. No tenderness.  No edema to lower extremities. Patient is alert and oriented.     Assessment:    Hepatitis C.  She at present denies drug abuse or etoh abuse. She says she is more compliant with her medications. I discussed this case with Dr. Laural Golden    Plan:    US abdomen, CBC, PT/INR, Cmet, AFP, Drug screen, urine. OV in 6 months

## 2012-05-02 NOTE — Patient Instructions (Addendum)
Follow up in 6 months 

## 2012-05-09 ENCOUNTER — Ambulatory Visit (HOSPITAL_COMMUNITY)
Admission: RE | Admit: 2012-05-09 | Discharge: 2012-05-09 | Disposition: A | Discharge status: Home / Self Care | Payer: Medicaid Other | Source: Ambulatory Visit | Attending: Internal Medicine | Admitting: Internal Medicine

## 2012-05-09 DIAGNOSIS — B1920 Unspecified viral hepatitis C without hepatic coma: Secondary | ICD-10-CM

## 2012-06-02 ENCOUNTER — Ambulatory Visit: Payer: Medicaid Other | Admitting: Family Medicine

## 2012-06-10 LAB — PROTIME-INR: Prothrombin Time: 12.7 seconds (ref 11.6–15.2)

## 2012-06-10 LAB — COMPREHENSIVE METABOLIC PANEL
ALT: 123 U/L — ABNORMAL HIGH (ref 0–35)
AST: 87 U/L — ABNORMAL HIGH (ref 0–37)
Alkaline Phosphatase: 142 U/L — ABNORMAL HIGH (ref 39–117)
Creat: 0.74 mg/dL (ref 0.50–1.10)
Sodium: 138 mEq/L (ref 135–145)
Total Bilirubin: 0.8 mg/dL (ref 0.3–1.2)
Total Protein: 7.2 g/dL (ref 6.0–8.3)

## 2012-06-11 LAB — CBC WITH DIFFERENTIAL/PLATELET
Basophils Absolute: 0 10*3/uL (ref 0.0–0.1)
Basophils Relative: 1 % (ref 0–1)
Eosinophils Absolute: 0.1 10*3/uL (ref 0.0–0.7)
Eosinophils Relative: 2 % (ref 0–5)
MCH: 30.8 pg (ref 26.0–34.0)
MCV: 91.2 fL (ref 78.0–100.0)
Neutrophils Relative %: 17 % — ABNORMAL LOW (ref 43–77)
Platelets: 143 10*3/uL — ABNORMAL LOW (ref 150–400)
RBC: 4.9 MIL/uL (ref 3.87–5.11)
RDW: 12.7 % (ref 11.5–15.5)

## 2012-06-11 LAB — DRUG SCREEN, URINE
Amphetamine Screen, Ur: NEGATIVE
Barbiturate Quant, Ur: NEGATIVE
Benzodiazepines.: NEGATIVE
Marijuana Metabolite: NEGATIVE
Methadone: NEGATIVE
Opiates: NEGATIVE

## 2012-06-20 ENCOUNTER — Ambulatory Visit (INDEPENDENT_AMBULATORY_CARE_PROVIDER_SITE_OTHER): Payer: Medicaid Other | Admitting: Family Medicine

## 2012-06-20 ENCOUNTER — Encounter: Payer: Self-pay | Admitting: Family Medicine

## 2012-06-20 VITALS — BP 160/100 | HR 96 | Resp 18 | Ht 66.0 in | Wt 146.1 lb

## 2012-06-20 DIAGNOSIS — Z13220 Encounter for screening for lipoid disorders: Secondary | ICD-10-CM

## 2012-06-20 DIAGNOSIS — E119 Type 2 diabetes mellitus without complications: Secondary | ICD-10-CM

## 2012-06-20 DIAGNOSIS — Z9119 Patient's noncompliance with other medical treatment and regimen: Secondary | ICD-10-CM

## 2012-06-20 DIAGNOSIS — F32A Depression, unspecified: Secondary | ICD-10-CM

## 2012-06-20 DIAGNOSIS — I10 Essential (primary) hypertension: Secondary | ICD-10-CM

## 2012-06-20 DIAGNOSIS — F329 Major depressive disorder, single episode, unspecified: Secondary | ICD-10-CM

## 2012-06-20 LAB — GLUCOSE, POCT (MANUAL RESULT ENTRY): POC Glucose: 277 mg/dl — AB (ref 70–99)

## 2012-06-20 MED ORDER — INSULIN ASPART 100 UNIT/ML ~~LOC~~ SOLN
3.0000 [IU] | Freq: Once | SUBCUTANEOUS | Status: AC
  Administered 2012-06-20: 3 [IU] via SUBCUTANEOUS

## 2012-06-20 NOTE — Patient Instructions (Addendum)
F/u in 2 month  Your blood pressure and blood sugar are high. I will be in touch with social services, your social worker etc, you need help.  You are not taking medication as prescribed, and your health is deteriorating  Labs today, lipid, cmp and EGFR, hBA1C, TSH  You will get one of your blood pressure tablets in the office today

## 2012-06-21 LAB — MICROALBUMIN / CREATININE URINE RATIO
Creatinine, Urine: 198.9 mg/dL
Microalb, Ur: 16.14 mg/dL — ABNORMAL HIGH (ref 0.00–1.89)

## 2012-07-03 NOTE — Assessment & Plan Note (Signed)
Denies suicidal or homicidal ideation, needs to comply with med however

## 2012-07-03 NOTE — Assessment & Plan Note (Signed)
Uncontrolled, pt medically non compliant,  Will also increase medication

## 2012-07-03 NOTE — Progress Notes (Signed)
  Subjective:    Patient ID: Latoya Walsh, female    DOB: March 06, 1961, 52 y.o.   MRN: [id number]  HPI The PT is here for follow up and re-evaluation of chronic medical conditions, medication management and review of any available recent lab and radiology data.  Preventive health is updated, specifically  Cancer screening and Immunization.   Did not keep appt made for endocrine and her blood sugars are even worse than in the pastor concerns regarding consultations or procedures which the PT has had in the interim are  addressed. The PT denies any adverse reactions to current medications since the last visit.  There are no new concerns.  There are no specific complaints       Review of Systems    See HPI Denies recent fever or chills. Denies sinus pressure, nasal congestion, ear pain or sore throat. Denies chest congestion, productive cough or wheezing. Denies chest pains, palpitations and leg swelling Denies abdominal pain, nausea, vomiting,diarrhea or constipation.   Denies dysuria, frequency, hesitancy or incontinence. Denies joint pain, swelling and limitation in mobility. Denies headaches, seizures, numbness, or tingling. Denies uncontrolled depression, anxiety or insomnia. Denies skin break down or rash.     Objective:   Physical Exam  Patient alert and oriented and in no cardiopulmonary distress.  HEENT: No facial asymmetry, EOMI, no sinus tenderness,  oropharynx pink and moist.  Neck supple no adenopathy.  Chest: Clear to auscultation bilaterally.  CVS: S1, S2 no murmurs, no S3.  ABD: Soft non tender. Bowel sounds normal.  Ext: No edema  MS: adequate  ROM spine, shoulders, hips and knees.  Skin: Intact, no ulcerations or rash noted.  Psych: Good eye contact, normal affect. Memory lossnot anxious or depressed appearing.  CNS: CN 2-12 intact, power, tone and sensation normal throughout.       Assessment & Plan:

## 2012-07-03 NOTE — Assessment & Plan Note (Signed)
Persistent ongoing problem , despite use of social worker support

## 2012-07-03 NOTE — Assessment & Plan Note (Signed)
Uncontrolled, pt has been referred to endocrinologist, has not been , direct contact will be made with the social worker to see what is happening High risk for all complications of uncontrolled blood sugar

## 2012-07-11 LAB — LIPID PANEL
HDL: 41 mg/dL (ref >39)
Total CHOL/HDL Ratio: 2.8 Ratio
Triglycerides: 141 mg/dL (ref <150)
VLDL: 28 mg/dL (ref 0–40)

## 2012-07-11 LAB — COMPLETE METABOLIC PANEL WITH GFR
AST: 87 U/L — ABNORMAL HIGH (ref 0–37)
Albumin: 3.5 g/dL (ref 3.5–5.2)
BUN: 17 mg/dL (ref 6–23)
Calcium: 9.4 mg/dL (ref 8.4–10.5)
Chloride: 102 mEq/L (ref 96–112)
Glucose, Bld: 214 mg/dL — ABNORMAL HIGH (ref 70–99)
Potassium: 4.6 mEq/L (ref 3.5–5.3)
Sodium: 137 mEq/L (ref 135–145)
Total Protein: 6.8 g/dL (ref 6.0–8.3)

## 2012-07-11 LAB — HEMOGLOBIN A1C: Hgb A1c MFr Bld: 10.4 % — ABNORMAL HIGH (ref <5.7)

## 2012-08-01 ENCOUNTER — Other Ambulatory Visit: Payer: Self-pay | Admitting: Family Medicine

## 2012-08-01 ENCOUNTER — Ambulatory Visit (INDEPENDENT_AMBULATORY_CARE_PROVIDER_SITE_OTHER): Payer: Medicaid Other | Admitting: Family Medicine

## 2012-08-01 ENCOUNTER — Encounter: Payer: Self-pay | Admitting: Family Medicine

## 2012-08-01 VITALS — BP 144/86 | HR 86 | Resp 18 | Ht 66.0 in | Wt 151.0 lb

## 2012-08-01 DIAGNOSIS — F329 Major depressive disorder, single episode, unspecified: Secondary | ICD-10-CM

## 2012-08-01 DIAGNOSIS — E1065 Type 1 diabetes mellitus with hyperglycemia: Secondary | ICD-10-CM

## 2012-08-01 DIAGNOSIS — F32A Depression, unspecified: Secondary | ICD-10-CM

## 2012-08-01 DIAGNOSIS — I10 Essential (primary) hypertension: Secondary | ICD-10-CM

## 2012-08-01 DIAGNOSIS — IMO0001 Reserved for inherently not codable concepts without codable children: Secondary | ICD-10-CM

## 2012-08-01 DIAGNOSIS — F3289 Other specified depressive episodes: Secondary | ICD-10-CM

## 2012-08-01 DIAGNOSIS — IMO0002 Reserved for concepts with insufficient information to code with codable children: Secondary | ICD-10-CM

## 2012-08-01 DIAGNOSIS — F1920 Other psychoactive substance dependence, uncomplicated: Secondary | ICD-10-CM

## 2012-08-01 NOTE — Assessment & Plan Note (Signed)
Elevated today, however, clearly under the influence of illicit drug, no med change

## 2012-08-01 NOTE — Assessment & Plan Note (Signed)
Pt to re establish with mental health services, not suicidal or homicidal

## 2012-08-01 NOTE — Progress Notes (Signed)
  Subjective:    Patient ID: Latoya Walsh, female    DOB: [birth year], 52 y.o.   MRN: [id number]  HPI Pt in with social worker,. Now following diet, seeing endo and states she will do better, keep appts follow restrictions Using 40 units lantus, fasting sugars are between 152 to 189 and she is recording 4 times daily as requested States willing to go for substance abuse class and wants mental health for depression, understands the need to work with her Education officer, museum and states that she will   Review of Systems See HPI Denies recent fever or chills. Denies sinus pressure, nasal congestion, ear pain or sore throat. Denies chest congestion, productive cough or wheezing. Denies chest pains, palpitations and leg swelling Denies abdominal pain, nausea, vomiting,diarrhea or constipation.   Denies dysuria, frequency, hesitancy or incontinence. Denies joint pain, swelling and limitation in mobility. Denies headaches, seizures, numbness, or tingling. Denies depression, anxiety or insomnia. Denies skin break down or rash.        Objective:   Physical Exam Patient alert and  in no cardiopulmonary distress."high on drugs" at this visit, wearing dark glasses, extremely "happy"  HEENT: No facial asymmetry, no sinus tenderness,  oropharynx  moist.  Neck supple no adenopathy.  Chest: Clear to auscultation bilaterally.  CVS: S1, S2 no murmurs, no S3.  ABD: Soft non tender. Bowel sounds normal.  Ext: No edema      CNS: CN 2-12 intact, power, normal throughout.        Assessment & Plan:

## 2012-08-01 NOTE — Assessment & Plan Note (Signed)
Improving under the care of endo

## 2012-08-01 NOTE — Patient Instructions (Addendum)
F/u in August, call if you need me before.  CONTINUE to eat the correct diet, take your medicatio as prescribed and keep all appointments with Dr Dorris Fetch.  Blood sugars look better.  Keep appointment with mental health as well as go to substance abuse classes, so that you "get clean"  I KNOW that you want to live, get healthy , and be really happy

## 2012-08-01 NOTE — Assessment & Plan Note (Signed)
Agrees to attend substance abuse classes and social worker will work with them on this

## 2012-08-08 ENCOUNTER — Ambulatory Visit: Payer: Medicaid Other | Admitting: Family Medicine

## 2012-11-01 ENCOUNTER — Telehealth: Payer: Self-pay | Admitting: Family Medicine

## 2012-11-01 ENCOUNTER — Ambulatory Visit: Payer: Medicaid Other | Admitting: Family Medicine

## 2012-11-01 NOTE — Telephone Encounter (Signed)
noted 

## 2012-11-03 ENCOUNTER — Encounter: Payer: Self-pay | Admitting: Family Medicine

## 2012-11-03 ENCOUNTER — Ambulatory Visit (INDEPENDENT_AMBULATORY_CARE_PROVIDER_SITE_OTHER): Payer: Medicaid Other | Admitting: Family Medicine

## 2012-11-03 ENCOUNTER — Ambulatory Visit: Payer: Medicaid Other | Admitting: Family Medicine

## 2012-11-03 VITALS — BP 132/82 | HR 90 | Resp 18 | Ht 66.0 in | Wt 145.1 lb

## 2012-11-03 DIAGNOSIS — I10 Essential (primary) hypertension: Secondary | ICD-10-CM

## 2012-11-03 DIAGNOSIS — F329 Major depressive disorder, single episode, unspecified: Secondary | ICD-10-CM

## 2012-11-03 DIAGNOSIS — Z9119 Patient's noncompliance with other medical treatment and regimen: Secondary | ICD-10-CM

## 2012-11-03 DIAGNOSIS — E1065 Type 1 diabetes mellitus with hyperglycemia: Secondary | ICD-10-CM

## 2012-11-03 DIAGNOSIS — IMO0001 Reserved for inherently not codable concepts without codable children: Secondary | ICD-10-CM

## 2012-11-03 DIAGNOSIS — J309 Allergic rhinitis, unspecified: Secondary | ICD-10-CM

## 2012-11-03 DIAGNOSIS — F32A Depression, unspecified: Secondary | ICD-10-CM

## 2012-11-03 DIAGNOSIS — B369 Superficial mycosis, unspecified: Secondary | ICD-10-CM

## 2012-11-03 MED ORDER — CLOTRIMAZOLE-BETAMETHASONE 1-0.05 % EX CREA: TOPICAL_CREAM | Freq: Two times a day (BID) | CUTANEOUS | Status: DC

## 2012-11-03 MED ORDER — FLUTICASONE PROPIONATE 50 MCG/ACT NA SUSP: 1.0000 | Freq: Every day | NASAL | Status: DC

## 2012-11-03 NOTE — Progress Notes (Signed)
  Subjective:    Patient ID: Latoya Walsh, female    DOB: [birth year], 52 y.o.   MRN: [id number]  HPI The PT is here for follow up and re-evaluation of chronic medical conditions, medication management and review of any available recent lab and radiology data.  Preventive health is updated, specifically  Cancer screening and Immunization.   Not testing her sugars and re,mains non compliant with her diet, Social worker has dropped the case due to ongoing non compliance C/o itchy rash on face and uncontrolled allergies, runny nose and eyes for past 2 weeks    Review of Systems See HPI Denies recent fever or chills.Generally feels weak t. Denies chest congestion, productive cough or wheezing. Denies chest pains, palpitations and leg swelling Denies abdominal pain, nausea, vomiting,diarrhea or constipation.   Denies dysuria, frequency, hesitancy or incontinence. Denies joint pain, swelling and limitation in mobility. Denies headaches, seizures, numbness, or tingling. C/o depression and poor sleep, not suicidal or homicidal and denies hallucinations.       Objective:   Physical Exam Patient alert and oriented and in no cardiopulmonary distress.Chronically ill appearing, poorly groomed  HEENT: No facial asymmetry, EOMI, no sinus tenderness,  oropharynx pink and moist.  Neck supple no adenopathy.  Chest: Clear to auscultation bilaterally.Decreased though adequate air entry  CVS: S1, S2 no murmurs, no S3.  ABD: Soft non tender. Bowel sounds normal.  Ext: No edema  MS: Adequate ROM spine, shoulders, hips and knees.  Skin: Intact, fungal rash noted.on face  Psych: Poor eye contact, flat affect. Memory impaired  depressed appearing.  CNS: CN 2-12 intact, power, tone and sensation normal throughout.        Assessment & Plan:

## 2012-11-03 NOTE — Patient Instructions (Addendum)
F/u in 4 month, call if you need me before  Flu vaccine available in October, call and cone for this please  Cream sent in for rash, and nose spray for allergies  Labs today hBA1C and chem 7  Please stop smoking  You need to make and keep an appointment with Dr Dorris Fetch

## 2012-11-04 LAB — BASIC METABOLIC PANEL
BUN: 18 mg/dL (ref 6–23)
Calcium: 9.5 mg/dL (ref 8.4–10.5)
Chloride: 100 mEq/L (ref 96–112)
Creat: 0.86 mg/dL (ref 0.50–1.10)

## 2012-11-04 LAB — HEMOGLOBIN A1C
Hgb A1c MFr Bld: 11 % — ABNORMAL HIGH (ref <5.7)
Mean Plasma Glucose: 269 mg/dL — ABNORMAL HIGH (ref <117)

## 2012-11-18 NOTE — Assessment & Plan Note (Signed)
Deteriorating needs to get to endo Patient advised to reduce carb and sweets, commit to regular physical activity, take meds as prescribed, test blood as directed, and attempt to lose weight, to improve blood sugar control.

## 2012-11-18 NOTE — Assessment & Plan Note (Signed)
omgoing challenge, noew Education officer, museum no longer following pt

## 2012-11-18 NOTE — Assessment & Plan Note (Signed)
Not suicidal or homicidal no hallucinations Med to be started

## 2012-11-18 NOTE — Assessment & Plan Note (Signed)
Uncontrolled start med

## 2012-11-18 NOTE — Assessment & Plan Note (Signed)
Controlled, no change in medication  

## 2012-11-18 NOTE — Assessment & Plan Note (Signed)
Topical med prescribed

## 2012-12-01 ENCOUNTER — Ambulatory Visit (INDEPENDENT_AMBULATORY_CARE_PROVIDER_SITE_OTHER): Payer: Medicaid Other | Admitting: Internal Medicine

## 2013-03-07 ENCOUNTER — Ambulatory Visit: Payer: Medicaid Other | Admitting: Family Medicine

## 2013-04-12 ENCOUNTER — Encounter (INDEPENDENT_AMBULATORY_CARE_PROVIDER_SITE_OTHER): Payer: Self-pay

## 2013-04-12 ENCOUNTER — Ambulatory Visit: Payer: Medicaid Other | Admitting: Family Medicine

## 2013-05-26 ENCOUNTER — Telehealth: Payer: Self-pay

## 2013-05-26 DIAGNOSIS — Z1231 Encounter for screening mammogram for malignant neoplasm of breast: Secondary | ICD-10-CM

## 2013-05-26 NOTE — Telephone Encounter (Signed)
Orders entered for mammogram.  Social worker called with appointment time for 3/10 @ 1:30

## 2013-05-30 ENCOUNTER — Ambulatory Visit (HOSPITAL_COMMUNITY): Payer: Medicaid Other

## 2013-06-06 ENCOUNTER — Ambulatory Visit (HOSPITAL_COMMUNITY)
Admission: RE | Admit: 2013-06-06 | Discharge: 2013-06-06 | Disposition: A | Discharge status: Home / Self Care | Payer: Medicaid Other | Source: Ambulatory Visit | Attending: Family Medicine | Admitting: Family Medicine

## 2013-06-06 DIAGNOSIS — Z1231 Encounter for screening mammogram for malignant neoplasm of breast: Secondary | ICD-10-CM

## 2013-08-31 ENCOUNTER — Ambulatory Visit: Payer: Medicaid Other | Admitting: Family Medicine

## 2013-10-09 ENCOUNTER — Encounter: Payer: Self-pay | Admitting: Family Medicine

## 2013-10-09 ENCOUNTER — Ambulatory Visit (INDEPENDENT_AMBULATORY_CARE_PROVIDER_SITE_OTHER): Payer: Medicaid Other | Admitting: Family Medicine

## 2013-10-09 VITALS — BP 140/100 | HR 101 | Resp 16 | Wt 139.0 lb

## 2013-10-09 DIAGNOSIS — I10 Essential (primary) hypertension: Secondary | ICD-10-CM

## 2013-10-09 DIAGNOSIS — IMO0002 Reserved for concepts with insufficient information to code with codable children: Secondary | ICD-10-CM

## 2013-10-09 DIAGNOSIS — Z91199 Patient's noncompliance with other medical treatment and regimen due to unspecified reason: Secondary | ICD-10-CM

## 2013-10-09 DIAGNOSIS — E1165 Type 2 diabetes mellitus with hyperglycemia: Secondary | ICD-10-CM

## 2013-10-09 DIAGNOSIS — F32A Depression, unspecified: Secondary | ICD-10-CM

## 2013-10-09 DIAGNOSIS — Z794 Long term (current) use of insulin: Secondary | ICD-10-CM

## 2013-10-09 DIAGNOSIS — B182 Chronic viral hepatitis C: Secondary | ICD-10-CM

## 2013-10-09 DIAGNOSIS — Z9119 Patient's noncompliance with other medical treatment and regimen: Secondary | ICD-10-CM

## 2013-10-09 DIAGNOSIS — F329 Major depressive disorder, single episode, unspecified: Secondary | ICD-10-CM

## 2013-10-09 DIAGNOSIS — IMO0001 Reserved for inherently not codable concepts without codable children: Secondary | ICD-10-CM

## 2013-10-09 DIAGNOSIS — F419 Anxiety disorder, unspecified: Secondary | ICD-10-CM

## 2013-10-09 DIAGNOSIS — F341 Dysthymic disorder: Secondary | ICD-10-CM

## 2013-10-09 DIAGNOSIS — E1065 Type 1 diabetes mellitus with hyperglycemia: Secondary | ICD-10-CM

## 2013-10-09 LAB — GLUCOSE, POCT (MANUAL RESULT ENTRY): POC GLUCOSE: 267 mg/dL — AB (ref 70–99)

## 2013-10-09 NOTE — Progress Notes (Signed)
   Subjective:    Patient ID: Latoya Walsh, female    DOB: 06-16-1960, 53 y.o.   MRN: [id number]  HPI The PT is here for follow up and re-evaluation of chronic medical conditions, medication management and review of any available recent lab and radiology data.  Preventive health is updated, specifically  Cancer screening and Immunization.   Not testing blood sugars and non compliant with medication, diet and follow up    Review of Systems See HPI Denies recent fever or chills.c/o fatigue and weight loss with vision impairment  And thirst Denies sinus pressure, nasal congestion, ear pain or sore throat. Denies chest congestion, productive cough or wheezing. Denies chest pains, palpitations and leg swelling Denies abdominal pain, nausea, vomiting,diarrhea or constipation.   Denies dysuria, frequency, hesitancy or incontinence. C/o  joint pain,  and limitation in mobility. Denies headaches, seizures, numbness, or tingling. C/o  depression, anxietyand  insomnia. Denies skin break down or rash.        Objective:   Physical Exam BP 140/100 mmHg  Pulse 101  Resp 16  Wt 139 lb (63.05 kg)  SpO2 97%  LMP 06/23/2010 Patient alert and oriented and in no cardiopulmonary distress.  HEENT: No facial asymmetry, EOMI,   oropharynx pink and moist.  Neck adequate ROM no JVD, no mass.  Chest: Clear to auscultation bilaterally.  CVS: S1, S2 no murmurs, no S3.Regular rate.  ABD: Soft non tender.   Ext: No edema  MS: Adequate ROM spine, shoulders, hips and knees.  Skin: Intact, no ulcerations or rash noted.  Psych: Good eye contact, flat affect. Memory loss  depressed appearing.  CNS: CN 2-12 intact, power,  normal throughout.no focal deficits noted.        Assessment & Plan:  HTN, goal below 130/80 Uncontrolled, due to non compliance, improtance of med adherence is again discussed and medication prescribed   Diabetes mellitus, insulin dependent (IDDM),  uncontrolled deteriorated Non compliant Patient advised to reduce carb and sweets, commit to regular physical activity, take meds as prescribed, test blood as directed, and attempt to lose weight, to improve blood sugar control. Needs to re establish with endo  Medically noncompliant Ongoing major problem despite repeated help from social services etc no change in behavior  Chronic hepatitis C without hepatic coma Being followed in the [facility] , she is strongly encouraged to comply with treatnment and keep up with appts  Anxiety and depression Uncontrolled, poor sleep and weight loss, start remron, not suicidal or homicidal

## 2013-10-09 NOTE — Patient Instructions (Addendum)
F/u in 4 month, call if you need me before  Blood  Pressure is too high, pls get medication that you should take   and start today    Keep next appt with Dr Dorris Fetch  New medication sent in  For appetie and your nerves , take at bedtime   You do need a colonoscopy and we will follow through getting this worked out

## 2013-10-10 ENCOUNTER — Other Ambulatory Visit (INDEPENDENT_AMBULATORY_CARE_PROVIDER_SITE_OTHER): Payer: Medicaid Other

## 2013-10-10 DIAGNOSIS — B182 Chronic viral hepatitis C: Secondary | ICD-10-CM

## 2013-10-10 LAB — COMPREHENSIVE METABOLIC PANEL
ALT: 83 U/L — AB (ref 0–35)
AST: 49 U/L — AB (ref 0–37)
Albumin: 3.6 g/dL (ref 3.5–5.2)
Alkaline Phosphatase: 123 U/L — ABNORMAL HIGH (ref 39–117)
BUN: 12 mg/dL (ref 6–23)
CALCIUM: 8.9 mg/dL (ref 8.4–10.5)
CHLORIDE: 102 meq/L (ref 96–112)
CO2: 25 mEq/L (ref 19–32)
CREATININE: 0.79 mg/dL (ref 0.50–1.10)
Glucose, Bld: 340 mg/dL — ABNORMAL HIGH (ref 70–99)
POTASSIUM: 4.2 meq/L (ref 3.5–5.3)
Sodium: 134 mEq/L — ABNORMAL LOW (ref 135–145)
Total Bilirubin: 0.6 mg/dL (ref 0.2–1.2)
Total Protein: 6.6 g/dL (ref 6.0–8.3)

## 2013-10-10 LAB — CBC WITH DIFFERENTIAL/PLATELET
BASOS ABS: 0 10*3/uL (ref 0.0–0.1)
Basophils Relative: 1 % (ref 0–1)
EOS PCT: 2 % (ref 0–5)
Eosinophils Absolute: 0.1 10*3/uL (ref 0.0–0.7)
HCT: 43.4 % (ref 36.0–46.0)
Hemoglobin: 14.5 g/dL (ref 12.0–15.0)
LYMPHS PCT: 63 % — AB (ref 12–46)
Lymphs Abs: 2.8 10*3/uL (ref 0.7–4.0)
MCH: 30.6 pg (ref 26.0–34.0)
MCHC: 33.4 g/dL (ref 30.0–36.0)
MCV: 91.6 fL (ref 78.0–100.0)
Monocytes Absolute: 0.3 10*3/uL (ref 0.1–1.0)
Monocytes Relative: 7 % (ref 3–12)
NEUTROS ABS: 1.2 10*3/uL — AB (ref 1.7–7.7)
Neutrophils Relative %: 27 % — ABNORMAL LOW (ref 43–77)
PLATELETS: 134 10*3/uL — AB (ref 150–400)
RBC: 4.74 MIL/uL (ref 3.87–5.11)
RDW: 12.9 % (ref 11.5–15.5)
WBC: 4.4 10*3/uL (ref 4.0–10.5)

## 2013-10-10 LAB — IRON: Iron: 113 ug/dL (ref 42–145)

## 2013-10-10 LAB — PROTIME-INR
INR: 0.96 (ref <1.50)
Prothrombin Time: 12.8 seconds (ref 11.6–15.2)

## 2013-10-11 LAB — HEPATITIS B SURFACE ANTIGEN: HEP B S AG: NEGATIVE

## 2013-10-11 LAB — HEPATITIS B SURFACE ANTIBODY,QUALITATIVE: Hep B S Ab: NEGATIVE

## 2013-10-11 LAB — ANA: ANA: NEGATIVE

## 2013-10-11 LAB — HEPATITIS A ANTIBODY, TOTAL: Hep A Total Ab: NONREACTIVE

## 2013-10-11 LAB — HIV ANTIBODY (ROUTINE TESTING W REFLEX): HIV: NONREACTIVE

## 2013-10-11 LAB — HEPATITIS B CORE ANTIBODY, TOTAL: Hep B Core Total Ab: NONREACTIVE

## 2013-10-14 LAB — HEPATITIS C RNA QUANTITATIVE
HCV QUANT: 3175463 [IU]/mL — AB (ref <15)
HCV Quantitative Log: 6.5 {Log} — ABNORMAL HIGH (ref <1.18)

## 2013-10-17 LAB — HEPATITIS C GENOTYPE

## 2013-11-23 ENCOUNTER — Encounter (INDEPENDENT_AMBULATORY_CARE_PROVIDER_SITE_OTHER): Payer: Self-pay

## 2013-11-23 ENCOUNTER — Encounter: Payer: Self-pay | Admitting: Family Medicine

## 2013-11-23 ENCOUNTER — Ambulatory Visit (INDEPENDENT_AMBULATORY_CARE_PROVIDER_SITE_OTHER): Payer: Medicaid Other | Admitting: Family Medicine

## 2013-11-23 ENCOUNTER — Other Ambulatory Visit (HOSPITAL_COMMUNITY)
Admission: RE | Admit: 2013-11-23 | Discharge: 2013-11-23 | Disposition: A | Discharge status: Home / Self Care | Payer: Medicaid Other | Source: Ambulatory Visit | Attending: Family Medicine | Admitting: Family Medicine

## 2013-11-23 VITALS — BP 140/88 | HR 116 | Resp 18 | Ht 66.0 in | Wt 140.0 lb

## 2013-11-23 DIAGNOSIS — Z113 Encounter for screening for infections with a predominantly sexual mode of transmission: Secondary | ICD-10-CM | POA: Insufficient documentation

## 2013-11-23 DIAGNOSIS — I10 Essential (primary) hypertension: Secondary | ICD-10-CM

## 2013-11-23 DIAGNOSIS — N760 Acute vaginitis: Secondary | ICD-10-CM | POA: Insufficient documentation

## 2013-11-23 DIAGNOSIS — R51 Headache: Secondary | ICD-10-CM | POA: Insufficient documentation

## 2013-11-23 DIAGNOSIS — R519 Headache, unspecified: Secondary | ICD-10-CM | POA: Insufficient documentation

## 2013-11-23 MED ORDER — METRONIDAZOLE 500 MG PO TABS: 500.0000 mg | ORAL_TABLET | Freq: Two times a day (BID) | ORAL | Status: DC

## 2013-11-23 NOTE — Patient Instructions (Signed)
F/u as before  You have an infection , and you NEED to be in touch with the office next week Tuesday, to make sure that you have all the medication you need for this. It is VERY important.  Start antibiotic one TWICE daily, already sent in, do NOT DRINK alcohol with this, it will make you sick  Two tylenol tabs today for your headache

## 2013-11-24 NOTE — Assessment & Plan Note (Signed)
Normal neurologicexam, tylenol tabs 2 given at visit

## 2013-11-24 NOTE — Progress Notes (Signed)
   Subjective:    Patient ID: Latoya Walsh, female    DOB: 17-Jan-1961, 53 y.o.   MRN: [id number]  HPI Pt in with a c/o excessive vaginal discharge which is irritating her, denies fever , chills abdominal pain or burning with urination or frequency C/o frontal headache for past 4 days, no increased sinus drainage, no earache  Or sre throat or cough   Review of Systems See HPI Denies recent fever or chills.  Denies abdominal pain, nausea, vomiting,diarrhea or constipation.   Denies dysuria, frequency, hesitancy or incontinence. Denies joint pain, swelling and limitation in mobility.      Objective:   Physical Exam BP 140/88  Pulse 116  Resp 18  Ht 5\' 6"  (1.676 m)  Wt 140 lb (63.504 kg)  BMI 22.61 kg/m2  SpO2 97%  LMP 06/23/2010 Patient alert and oriented and in no cardiopulmonary distress.  HEENT: No facial asymmetry, EOMI,   oropharynx pink and moist.  Neck supple no JVD, no mass.  Chest: Clear to auscultation bilaterally.  CVS: S1, S2 no murmurs, no S3.Regular rate.  ABD: Soft non tender.  Pelvic: copious yellow vaginal d/c with shallow ulcer at introitus at 6 o clock, and erythema of vaginal mucosa  CNS: CN 2-12 intact, power,  normal throughout.no focal deficits noted.        Assessment & Plan:  Vaginitis and vulvovaginitis Specimens sent for testing, pt started on flagyll , advised against alcohol use while taking and she is to bein touch with office next week when culture reports are available hSV2 testing will be done with next labs, due to ulcer, she denies stinging/ burning   Headache(784.0) Normal neurologicexam, tylenol tabs 2 given at visit  HTN, goal below 130/80 Uncontrolled, not at goal, medication compliance an ongoing problem

## 2013-11-24 NOTE — Assessment & Plan Note (Signed)
Uncontrolled, not at goal, medication compliance an ongoing problem

## 2013-11-24 NOTE — Assessment & Plan Note (Signed)
Specimens sent for testing, pt started on flagyll , advised against alcohol use while taking and she is to bein touch with office next week when culture reports are available hSV2 testing will be done with next labs, due to ulcer, she denies stinging/ burning

## 2013-12-04 ENCOUNTER — Encounter: Payer: Self-pay | Admitting: Infectious Disease

## 2013-12-04 ENCOUNTER — Ambulatory Visit (INDEPENDENT_AMBULATORY_CARE_PROVIDER_SITE_OTHER): Payer: Medicaid Other | Admitting: Infectious Disease

## 2013-12-04 VITALS — BP 140/90 | HR 97 | Temp 98.2°F | Wt 142.0 lb

## 2013-12-04 DIAGNOSIS — B182 Chronic viral hepatitis C: Secondary | ICD-10-CM

## 2013-12-04 DIAGNOSIS — E1159 Type 2 diabetes mellitus with other circulatory complications: Secondary | ICD-10-CM

## 2013-12-04 DIAGNOSIS — E1059 Type 1 diabetes mellitus with other circulatory complications: Secondary | ICD-10-CM

## 2013-12-04 NOTE — Progress Notes (Signed)
   Subjective:    Patient ID: Latoya Walsh, female    DOB: Mar 20, 1961, 53 y.o.   MRN: [id number]  HPI  53 year old African American female twin sister of one of my other tonic patient's. Whisper has hepatitis C mono infection and is NOT immune to Hep A or Hep B.   her viral load was [phone] copies and her genotype was 1A.  We do not yet have a hepatitis C fibrosis  Score for her  She is without complaints today other than some chronic left sided flank pain that she thinks is due to a mattress it doesn't sit well with her   Review of Systems  Constitutional: Negative for fever, chills, diaphoresis, activity change, appetite change, fatigue and unexpected weight change.  HENT: Negative for congestion, sinus pressure, sneezing and sore throat.   Eyes: Negative for photophobia and visual disturbance.  Respiratory: Negative for cough and shortness of breath.   Gastrointestinal: Negative for nausea, vomiting, abdominal pain, diarrhea and abdominal distention.  Genitourinary: Negative for dysuria, hematuria, flank pain and difficulty urinating.  Musculoskeletal: Positive for back pain. Negative for arthralgias, gait problem, joint swelling and myalgias.  Skin: Negative for color change, pallor, rash and wound.  Neurological: Negative for dizziness, tremors, weakness and light-headedness.  Hematological: Negative for adenopathy. Does not bruise/bleed easily.  Psychiatric/Behavioral: Negative for behavioral problems, confusion, sleep disturbance, dysphoric mood, decreased concentration and agitation.       Objective:   Physical Exam  Constitutional: She is oriented to person, place, and time. No distress.  HENT:  Head: Normocephalic and atraumatic.  Eyes: Conjunctivae and EOM are normal.  Neck: Normal range of motion. Neck supple.  Cardiovascular: Normal rate and regular rhythm.   Pulmonary/Chest: Effort normal. No respiratory distress. She has no wheezes.  Abdominal: She  exhibits no distension. There is no tenderness. There is no rebound.  Musculoskeletal: Normal range of motion.  Neurological: She is alert and oriented to person, place, and time.  Skin: Skin is warm and dry. She is not diaphoretic.  Psychiatric: Her mood appears not anxious. Her affect is not angry, not blunt, not labile and not inappropriate. Her speech is delayed. She does not exhibit a depressed mood.          Assessment & Plan:   Hepatitis C without hepatic coma:  --We will check a hepatitis C FIBROSURE --she will need vaccination for Hep A and Hep B which we can do at her next clinic visit  --FIrst we will fill out paperwork for trying to get her treatment for Hep C. unlike her sister she has not co-infected and therefore it may be harder to argue for her having an urgent need for treatment unless her fibrosis score is higher than that of her sister but we'll see what Medicaid status.

## 2013-12-07 ENCOUNTER — Telehealth: Payer: Self-pay | Admitting: Infectious Disease

## 2013-12-07 LAB — HEPATITIS C VIRUS FIBROSURE
ALT (SGPT)(02): 65 IU/L — AB (ref 0–40)
Alpha-2-Macroglobulin, Qn: 387 mg/dL — ABNORMAL HIGH (ref 110–276)
Apolipoprotein A-1: 111 mg/dL (ref 110–205)
Bilirubin, total: 0.3 mg/dL (ref 0.0–1.2)
FIBROSIS SCORE(01): 0.74 — AB (ref 0.00–0.21)
GGT: 393 IU/L — ABNORMAL HIGH (ref 0–60)
Haptoglobin: 106 mg/dL (ref 34–200)
NECROINFLAMM ACTVTY SCORE(01): 0.55 — AB (ref 0.00–0.17)

## 2013-12-07 MED ORDER — LEDIPASVIR-SOFOSBUVIR 90-400 MG PO TABS: 1.0000 | ORAL_TABLET | Freq: Every day | ORAL | Status: DC

## 2013-12-07 NOTE — Telephone Encounter (Signed)
Fibrosis scorecame in high. WIlll write script for harvoni.  WIll need to review with her if she gets approved on how to take this, maybe it will be approved before her sister comes for her followup appt and they can come together

## 2013-12-11 ENCOUNTER — Ambulatory Visit: Payer: Medicaid Other | Admitting: Infectious Disease

## 2013-12-20 ENCOUNTER — Telehealth: Payer: Self-pay | Admitting: Infectious Disease

## 2013-12-20 MED ORDER — OMBITAS-PARITAPRE-RITONA-DASAB 12.5-75-50 &250 MG PO TBPK: 3.0000 | ORAL_TABLET | ORAL | Status: DC

## 2013-12-20 MED ORDER — RIBAVIRIN 400 & 600 MG PO MISC: 1.0000 | ORAL | Status: DC

## 2013-12-20 NOTE — Telephone Encounter (Signed)
i put in rx for Viekera pak and ribavirin for [name] for one months supply with 5 refills.   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

## 2014-01-01 ENCOUNTER — Ambulatory Visit: Payer: Medicaid Other | Admitting: Infectious Disease

## 2014-01-02 ENCOUNTER — Ambulatory Visit: Payer: Medicaid Other

## 2014-01-30 ENCOUNTER — Ambulatory Visit: Payer: Medicaid Other | Admitting: Pharmacist Clinician (PhC)/ Clinical Pharmacy Specialist

## 2014-01-30 ENCOUNTER — Other Ambulatory Visit: Payer: Medicaid Other

## 2014-01-30 DIAGNOSIS — B182 Chronic viral hepatitis C: Secondary | ICD-10-CM

## 2014-01-30 DIAGNOSIS — K7460 Unspecified cirrhosis of liver: Secondary | ICD-10-CM

## 2014-01-30 LAB — CBC WITH DIFFERENTIAL/PLATELET
Basophils Absolute: 0 10*3/uL (ref 0.0–0.1)
Basophils Relative: 0 % (ref 0–1)
Eosinophils Absolute: 0.2 10*3/uL (ref 0.0–0.7)
Eosinophils Relative: 2 % (ref 0–5)
HEMATOCRIT: 38.6 % (ref 36.0–46.0)
Hemoglobin: 12.8 g/dL (ref 12.0–15.0)
LYMPHS PCT: 52 % — AB (ref 12–46)
Lymphs Abs: 3.9 10*3/uL (ref 0.7–4.0)
MCH: 30.8 pg (ref 26.0–34.0)
MCHC: 33.2 g/dL (ref 30.0–36.0)
MCV: 92.8 fL (ref 78.0–100.0)
MONO ABS: 0.3 10*3/uL (ref 0.1–1.0)
Monocytes Relative: 4 % (ref 3–12)
NEUTROS ABS: 3.2 10*3/uL (ref 1.7–7.7)
Neutrophils Relative %: 42 % — ABNORMAL LOW (ref 43–77)
Platelets: 185 10*3/uL (ref 150–400)
RBC: 4.16 MIL/uL (ref 3.87–5.11)
RDW: 13.1 % (ref 11.5–15.5)
WBC: 7.5 10*3/uL (ref 4.0–10.5)

## 2014-01-30 NOTE — Progress Notes (Signed)
HPI: Latoya Walsh is a 53 y.o. female in office today for follow-up Hep C mono-infection visit.  She presents today after picking up her next month's worth of meds from the pharmacy.  Allergies: Allergies  Allergen Reactions  . Penicillins Itching and Rash    Vitals:    Past Medical History: Past Medical History  Diagnosis Date  . Diabetes mellitus   . Depression   . Anxiety   . Hypertension   . Substance abuse     HX of drug use and alcohol use  . Hepatitis C     Social History: History   Social History  . Marital Status: Single    Spouse Name: N/A    Number of Children: 3  . Years of Education: N/A   Occupational History  . Disabled    Social History Main Topics  . Smoking status: Current Every Day Smoker -- 0.20 packs/day    Types: Cigarettes  . Smokeless tobacco: Not on file     Comment: 1/2 pack x 40 yrs  . Alcohol Use: Yes     Comment: 2 16oz cans of beer a day  . Drug Use: No     Comment: Quit. Crack cocaine  . Sexual Activity: Not on file   Other Topics Concern  . Not on file   Social History Narrative  . No narrative on file    Current Regimen: Viekira pak, ribavirin 449m Qam and 6038mQpm  Labs: HEP B S AB (no units)  Date Value  10/10/2013 NEG   HEPATITIS B SURFACE AG (no units)  Date Value  10/10/2013 NEGATIVE    CrCl: CrCl cannot be calculated (Unknown ideal weight.).  Lipids:    Component Value Date/Time   CHOL 114 07/11/2012 0855   TRIG 141 07/11/2012 0855   HDL 41 07/11/2012 0855   CHOLHDL 2.8 07/11/2012 0855   VLDL 28 07/11/2012 0855   LDLCALC 45 07/11/2012 0855    Plan: Pt has completed 1 month of treatment with Viekira pak and ribavirin for Hep C mono-infection.  She is here to get repeat lab work done in order to process her claim for her final month of therapy.  I met with the patient today to reinforce education and encourage strict compliance during her therapy.  Latoya Walsh, TeJulaine HuaPharmD Clinical Infectious  Disease [facility] 01/30/2014, 1:46 PM

## 2014-01-31 LAB — HEPATITIS C RNA QUANTITATIVE: HCV Quantitative: NOT DETECTED IU/mL (ref <15)

## 2014-02-12 ENCOUNTER — Ambulatory Visit (INDEPENDENT_AMBULATORY_CARE_PROVIDER_SITE_OTHER): Payer: Medicaid Other

## 2014-02-12 ENCOUNTER — Ambulatory Visit (INDEPENDENT_AMBULATORY_CARE_PROVIDER_SITE_OTHER): Payer: Medicaid Other | Admitting: Family Medicine

## 2014-02-12 ENCOUNTER — Encounter: Payer: Self-pay | Admitting: Family Medicine

## 2014-02-12 VITALS — BP 140/92 | HR 91 | Resp 16 | Ht 66.0 in | Wt 135.4 lb

## 2014-02-12 DIAGNOSIS — Z79899 Other long term (current) drug therapy: Secondary | ICD-10-CM

## 2014-02-12 DIAGNOSIS — I10 Essential (primary) hypertension: Secondary | ICD-10-CM | POA: Diagnosis not present

## 2014-02-12 DIAGNOSIS — Z23 Encounter for immunization: Secondary | ICD-10-CM

## 2014-02-12 DIAGNOSIS — Z91199 Patient's noncompliance with other medical treatment and regimen due to unspecified reason: Secondary | ICD-10-CM

## 2014-02-12 DIAGNOSIS — F419 Anxiety disorder, unspecified: Secondary | ICD-10-CM

## 2014-02-12 DIAGNOSIS — F329 Major depressive disorder, single episode, unspecified: Secondary | ICD-10-CM | POA: Diagnosis not present

## 2014-02-12 DIAGNOSIS — F064 Anxiety disorder due to known physiological condition: Secondary | ICD-10-CM

## 2014-02-12 DIAGNOSIS — Z794 Long term (current) use of insulin: Secondary | ICD-10-CM

## 2014-02-12 DIAGNOSIS — F32A Depression, unspecified: Secondary | ICD-10-CM

## 2014-02-12 DIAGNOSIS — E1065 Type 1 diabetes mellitus with hyperglycemia: Secondary | ICD-10-CM

## 2014-02-12 DIAGNOSIS — F5105 Insomnia due to other mental disorder: Secondary | ICD-10-CM

## 2014-02-12 DIAGNOSIS — IMO0001 Reserved for inherently not codable concepts without codable children: Secondary | ICD-10-CM

## 2014-02-12 DIAGNOSIS — E1165 Type 2 diabetes mellitus with hyperglycemia: Secondary | ICD-10-CM

## 2014-02-12 DIAGNOSIS — Z9119 Patient's noncompliance with other medical treatment and regimen: Secondary | ICD-10-CM

## 2014-02-12 MED ORDER — RAMIPRIL 5 MG PO CAPS: 5.0000 mg | ORAL_CAPSULE | Freq: Every day | ORAL | Status: DC

## 2014-02-12 MED ORDER — MIRTAZAPINE 15 MG PO TABS: 15.0000 mg | ORAL_TABLET | Freq: Every day | ORAL | Status: DC

## 2014-02-12 NOTE — Patient Instructions (Addendum)
F/u in 4 month, call if you need me before,   Pls collect remron, for depression and appetite and sleep ramipril for your blood pressure  Need CBC, lipid, cmp, hBa1C, tSH and microalb  This week  Flu vac today

## 2014-02-13 ENCOUNTER — Ambulatory Visit: Payer: Medicaid Other | Admitting: Infectious Disease

## 2014-02-13 LAB — MICROALBUMIN / CREATININE URINE RATIO
Creatinine, Urine: 51.8 mg/dL
MICROALB UR: 4.2 mg/dL — AB (ref <2.0)
Microalb Creat Ratio: 81.1 mg/g — ABNORMAL HIGH (ref 0.0–30.0)

## 2014-02-18 DIAGNOSIS — Z23 Encounter for immunization: Secondary | ICD-10-CM | POA: Insufficient documentation

## 2014-02-18 NOTE — Assessment & Plan Note (Signed)
Uncontrolled, pt not taking medication as prescribed DASH diet and commitment to daily physical activity for a minimum of 30 minutes discussed and encouraged, as a part of hypertension management. The importance of attaining a healthy weight is also discussed.

## 2014-02-18 NOTE — Assessment & Plan Note (Signed)
Sleep hygiene reviewed and written information offered also. Prescription sent for  medication needed.  

## 2014-02-18 NOTE — Assessment & Plan Note (Signed)
uncontrolled pt needs to resume medication Sleep hygiene reviewed and written information offered also. Prescription sent for  medication needed.

## 2014-02-18 NOTE — Progress Notes (Signed)
   Subjective:    Patient ID: Latoya Walsh, female    DOB: 05-30-1960, 53 y.o.   MRN: [id number]  HPI The PT is here for follow up and re-evaluation of chronic medical conditions, medication management and review of any available recent lab and radiology data.  Preventive health is updated, specifically  Cancer screening and Immunization.   Now being seen by ID clinic for hep C The PT denies any adverse reactions to current medications since the last visit. However remains non compliant with mediciton prescribed and is again at the visit with no medication in hand Concerned about weight loss and also reports depression, not suicidal or homicidal , will not commit to mental health treatment      Review of Systems See HPI Denies recent fever or chills. Denies sinus pressure, nasal congestion, ear pain or sore throat. Denies chest congestion, productive cough or wheezing. Denies chest pains, palpitations and leg swelling Denies abdominal pain, nausea, vomiting,diarrhea or constipation.  C/o poor appetite and weight Denies dysuria, frequency, hesitancy or incontinence. Denies joint pain, swelling and limitation in mobility. Denies headaches, seizures, numbness, or tingling. C/o  depression, anxiety and insomnia. Denies skin break down or rash.        Objective:   Physical Exam  BP 140/92 mmHg  Pulse 91  Resp 16  Ht 5\' 6"  (1.676 m)  Wt 135 lb 6.4 oz (61.417 kg)  BMI 21.86 kg/m2  SpO2 96%  LMP 06/23/2010  Patient alert and oriented and in no cardiopulmonary distress.  HEENT: No facial asymmetry, EOMI,   oropharynx pink and moist.  Neck supple no JVD, no mass.  Chest: Clear to auscultation bilaterally.  CVS: S1, S2 no murmurs, no S3.Regular rate.  ABD: Soft non tender.   Ext: No edema  MS: Adequate ROM spine, shoulders, hips and knees.  Skin: Intact, no ulcerations or rash noted.  Psych: Good eye contact, flat  affect. Memory impaired not anxious or depressed  appearing.  CNS: CN 2-12 intact, power,  normal throughout.no focal deficits noted.       Assessment & Plan:  Anxiety and depression uncontrolled pt needs to resume medication Sleep hygiene reviewed and written information offered also. Prescription sent for  medication needed.    Hyposomnia, insomnia or sleeplessness associated with anxiety Sleep hygiene reviewed and written information offered also. Prescription sent for  medication needed.    HTN, goal below 130/80 Uncontrolled, pt not taking medication as prescribed DASH diet and commitment to daily physical activity for a minimum of 30 minutes discussed and encouraged, as a part of hypertension management. The importance of attaining a healthy weight is also discussed.    Diabetes mellitus, insulin dependent (IDDM), uncontrolled Uncontrolled, Updated lab needed at/ before next visit.  Needs to f/u with endo Patient educated about the importance of limiting  Carbohydrate intake , the need to commit to daily physical activity for a minimum of 30 minutes , and to commit weight loss. The fact that changes in all these areas will reduce or eliminate all together the development of diabetes is stressed.      Need for prophylactic vaccination and inoculation against influenza Vaccine administered at visit.    Medically noncompliant Ongoing effort being made to get pt to engage with local community resources and Providers needed to improve her health, no commitment made

## 2014-02-18 NOTE — Assessment & Plan Note (Addendum)
Uncontrolled, Updated lab needed at/ before next visit.  Needs to f/u with endo Patient educated about the importance of limiting  Carbohydrate intake , the need to commit to daily physical activity for a minimum of 30 minutes , and to commit weight loss. The fact that changes in all these areas will reduce or eliminate all together the development of diabetes is stressed.

## 2014-02-18 NOTE — Assessment & Plan Note (Signed)
Vaccine administered at visit.  

## 2014-02-19 LAB — COMPREHENSIVE METABOLIC PANEL
ALT: 11 U/L (ref 0–35)
AST: 10 U/L (ref 0–37)
Albumin: 3.9 g/dL (ref 3.5–5.2)
Alkaline Phosphatase: 104 U/L (ref 39–117)
BUN: 17 mg/dL (ref 6–23)
CO2: 23 mEq/L (ref 19–32)
CREATININE: 0.84 mg/dL (ref 0.50–1.10)
Calcium: 9.3 mg/dL (ref 8.4–10.5)
Chloride: 104 mEq/L (ref 96–112)
Glucose, Bld: 426 mg/dL — ABNORMAL HIGH (ref 70–99)
Potassium: 5 mEq/L (ref 3.5–5.3)
Sodium: 136 mEq/L (ref 135–145)
Total Bilirubin: 1.1 mg/dL (ref 0.2–1.2)
Total Protein: 7 g/dL (ref 6.0–8.3)

## 2014-02-19 LAB — CBC WITH DIFFERENTIAL/PLATELET
Basophils Absolute: 0.1 10*3/uL (ref 0.0–0.1)
Basophils Relative: 1 % (ref 0–1)
EOS PCT: 3 % (ref 0–5)
Eosinophils Absolute: 0.2 10*3/uL (ref 0.0–0.7)
HEMATOCRIT: 37.7 % (ref 36.0–46.0)
Hemoglobin: 12.3 g/dL (ref 12.0–15.0)
LYMPHS ABS: 3.4 10*3/uL (ref 0.7–4.0)
Lymphocytes Relative: 57 % — ABNORMAL HIGH (ref 12–46)
MCH: 29.9 pg (ref 26.0–34.0)
MCHC: 32.6 g/dL (ref 30.0–36.0)
MCV: 91.7 fL (ref 78.0–100.0)
MPV: 11 fL (ref 9.4–12.4)
Monocytes Absolute: 0.3 10*3/uL (ref 0.1–1.0)
Monocytes Relative: 5 % (ref 3–12)
NEUTROS ABS: 2 10*3/uL (ref 1.7–7.7)
Neutrophils Relative %: 34 % — ABNORMAL LOW (ref 43–77)
PLATELETS: 153 10*3/uL (ref 150–400)
RBC: 4.11 MIL/uL (ref 3.87–5.11)
RDW: 13.2 % (ref 11.5–15.5)
WBC: 5.9 10*3/uL (ref 4.0–10.5)

## 2014-02-19 LAB — LIPID PANEL
Cholesterol: 135 mg/dL (ref 0–200)
HDL: 27 mg/dL — AB (ref >39)
LDL CALC: 68 mg/dL (ref 0–99)
TRIGLYCERIDES: 202 mg/dL — AB (ref <150)
Total CHOL/HDL Ratio: 5 Ratio
VLDL: 40 mg/dL (ref 0–40)

## 2014-02-19 LAB — HEMOGLOBIN A1C
Hgb A1c MFr Bld: 9.2 % — ABNORMAL HIGH (ref <5.7)
Mean Plasma Glucose: 217 mg/dL — ABNORMAL HIGH (ref <117)

## 2014-02-19 LAB — TSH: TSH: 1.419 u[IU]/mL (ref 0.350–4.500)

## 2014-02-28 ENCOUNTER — Ambulatory Visit (INDEPENDENT_AMBULATORY_CARE_PROVIDER_SITE_OTHER): Payer: Medicaid Other | Admitting: Infectious Disease

## 2014-02-28 ENCOUNTER — Encounter: Payer: Self-pay | Admitting: Infectious Disease

## 2014-02-28 VITALS — BP 145/83 | HR 99 | Temp 98.2°F | Wt 137.0 lb

## 2014-02-28 DIAGNOSIS — B182 Chronic viral hepatitis C: Secondary | ICD-10-CM

## 2014-02-28 NOTE — Progress Notes (Signed)
   Subjective:    Patient ID: Latoya Walsh, female    DOB: 11/24/1960, 53 y.o.   MRN: [id number]  HPI   53 year old [place] American female twin sister of one of my other patient's. Latoya Walsh has hepatitis C mono infection and is NOT immune to Hep A or Hep B.  Her viral load was 3,[phone] copies and her genotype was 1A.  She has completed month 2/3 of VIEKIRA pack + RIBAVIRIN. She demonstrated exactly how to take her dose for the morning (she had just run out of her last months supply last night)   Review of Systems  Constitutional: Negative for fever, chills, diaphoresis, activity change, appetite change, fatigue and unexpected weight change.  HENT: Negative for congestion, sinus pressure, sneezing and sore throat.   Eyes: Negative for photophobia and visual disturbance.  Respiratory: Negative for cough and shortness of breath.   Gastrointestinal: Negative for nausea, vomiting, abdominal pain, diarrhea and abdominal distention.  Genitourinary: Negative for dysuria, hematuria, flank pain and difficulty urinating.  Musculoskeletal: Positive for back pain. Negative for myalgias, joint swelling, arthralgias and gait problem.  Skin: Negative for color change, pallor, rash and wound.  Neurological: Negative for dizziness, tremors, weakness and light-headedness.  Hematological: Negative for adenopathy. Does not bruise/bleed easily.  Psychiatric/Behavioral: Negative for behavioral problems, confusion, sleep disturbance, dysphoric mood, decreased concentration and agitation.       Objective:   Physical Exam  Constitutional: She is oriented to person, place, and time. She appears well-developed and well-nourished. No distress.  HENT:  Head: Normocephalic and atraumatic.  Mouth/Throat: No oropharyngeal exudate.  Eyes: Conjunctivae and EOM are normal. No scleral icterus.  Neck: Normal range of motion. Neck supple.  Cardiovascular: Normal rate and regular rhythm.   Pulmonary/Chest:  Effort normal. No respiratory distress. She has no wheezes.  Abdominal: She exhibits no distension.  Musculoskeletal: She exhibits no edema or tenderness.  Neurological: She is alert and oriented to person, place, and time. She exhibits normal muscle tone. Coordination normal.  Skin: Skin is warm and dry. No rash noted. She is not diaphoretic. No erythema. No pallor.  Psychiatric: She has a normal mood and affect. Her behavior is normal. Judgment and thought content normal.          Assessment & Plan:   Hepatitis C without hepatic coma:  --finish 3rd month of VIEKERA Pak + RIBAVIRIN --check HCV RNA at 4 months for 12 week SVR test of cure --we will also need to ensure that she gets Hep A and B vaccinated --educated re using protection with her boyfriend and finding out his status for HIV and Hep C  I spent greater than 25 minutes with the patient including greater than 50% of time in face to face counsel of the patient and in coordination of their care.

## 2014-04-01 NOTE — Assessment & Plan Note (Signed)
deteriorated Non compliant Patient advised to reduce carb and sweets, commit to regular physical activity, take meds as prescribed, test blood as directed, and attempt to lose weight, to improve blood sugar control. Needs to re establish with endo

## 2014-04-01 NOTE — Assessment & Plan Note (Signed)
Uncontrolled, poor sleep and weight loss, start remron, not suicidal or homicidal

## 2014-04-01 NOTE — Assessment & Plan Note (Signed)
Ongoing major problem despite repeated help from social services etc no change in behavior

## 2014-04-01 NOTE — Assessment & Plan Note (Signed)
Uncontrolled, due to non compliance, improtance of med adherence is again discussed and medication prescribed

## 2014-04-01 NOTE — Assessment & Plan Note (Signed)
Being followed in the Id clinic , she is strongly encouraged to comply with treatnment and keep up with appts

## 2014-06-11 ENCOUNTER — Encounter: Payer: Self-pay | Admitting: Family Medicine

## 2014-06-11 ENCOUNTER — Ambulatory Visit (INDEPENDENT_AMBULATORY_CARE_PROVIDER_SITE_OTHER): Payer: Medicaid Other | Admitting: Family Medicine

## 2014-06-11 VITALS — BP 130/88 | HR 92 | Resp 16 | Ht 66.0 in | Wt 144.0 lb

## 2014-06-11 DIAGNOSIS — F064 Anxiety disorder due to known physiological condition: Secondary | ICD-10-CM | POA: Diagnosis not present

## 2014-06-11 DIAGNOSIS — Z23 Encounter for immunization: Secondary | ICD-10-CM

## 2014-06-11 DIAGNOSIS — E1065 Type 1 diabetes mellitus with hyperglycemia: Secondary | ICD-10-CM

## 2014-06-11 DIAGNOSIS — F329 Major depressive disorder, single episode, unspecified: Secondary | ICD-10-CM

## 2014-06-11 DIAGNOSIS — I10 Essential (primary) hypertension: Secondary | ICD-10-CM | POA: Diagnosis not present

## 2014-06-11 DIAGNOSIS — Z794 Long term (current) use of insulin: Principal | ICD-10-CM

## 2014-06-11 DIAGNOSIS — F418 Other specified anxiety disorders: Secondary | ICD-10-CM | POA: Diagnosis not present

## 2014-06-11 DIAGNOSIS — F1920 Other psychoactive substance dependence, uncomplicated: Secondary | ICD-10-CM

## 2014-06-11 DIAGNOSIS — F419 Anxiety disorder, unspecified: Secondary | ICD-10-CM

## 2014-06-11 DIAGNOSIS — F17218 Nicotine dependence, cigarettes, with other nicotine-induced disorders: Secondary | ICD-10-CM

## 2014-06-11 DIAGNOSIS — E1165 Type 2 diabetes mellitus with hyperglycemia: Principal | ICD-10-CM

## 2014-06-11 DIAGNOSIS — F5105 Insomnia due to other mental disorder: Secondary | ICD-10-CM

## 2014-06-11 DIAGNOSIS — IMO0001 Reserved for inherently not codable concepts without codable children: Secondary | ICD-10-CM

## 2014-06-11 NOTE — Patient Instructions (Addendum)
Annual physical examin 3 month, call if you need me before  Prevnar today  You are referred for colonoscopy, psychiatry ([facility]), eye exam and Dr Dorris Fetch, we will co ordinate  With your nurse  Labs today, HBA1C, lipid cmp and EGFR

## 2014-06-30 LAB — COMPLETE METABOLIC PANEL WITH GFR
ALBUMIN: 4 g/dL (ref 3.5–5.2)
ALK PHOS: 113 U/L (ref 39–117)
ALT: 76 U/L — AB (ref 0–35)
AST: 53 U/L — ABNORMAL HIGH (ref 0–37)
BILIRUBIN TOTAL: 0.6 mg/dL (ref 0.2–1.2)
BUN: 17 mg/dL (ref 6–23)
CO2: 28 mEq/L (ref 19–32)
Calcium: 9.6 mg/dL (ref 8.4–10.5)
Chloride: 105 mEq/L (ref 96–112)
Creat: 0.89 mg/dL (ref 0.50–1.10)
GFR, EST AFRICAN AMERICAN: 87 mL/min
GFR, Est Non African American: 75 mL/min
Glucose, Bld: 208 mg/dL — ABNORMAL HIGH (ref 70–99)
POTASSIUM: 4.6 meq/L (ref 3.5–5.3)
Sodium: 140 mEq/L (ref 135–145)
TOTAL PROTEIN: 7.3 g/dL (ref 6.0–8.3)

## 2014-06-30 LAB — LIPID PANEL
CHOLESTEROL: 152 mg/dL (ref 0–200)
HDL: 49 mg/dL (ref >=46)
LDL CALC: 77 mg/dL (ref 0–99)
Total CHOL/HDL Ratio: 3.1 Ratio
Triglycerides: 128 mg/dL (ref <150)
VLDL: 26 mg/dL (ref 0–40)

## 2014-06-30 LAB — HEMOGLOBIN A1C
HEMOGLOBIN A1C: 11.4 % — AB (ref <5.7)
Mean Plasma Glucose: 280 mg/dL — ABNORMAL HIGH (ref <117)

## 2014-07-03 DIAGNOSIS — Z23 Encounter for immunization: Secondary | ICD-10-CM | POA: Insufficient documentation

## 2014-07-03 NOTE — Progress Notes (Signed)
   Subjective:    Patient ID: Latoya Walsh, female    DOB: 1960-07-28, 54 y.o.   MRN: [id number]  HPI The PT is here for follow up and re-evaluation of chronic medical conditions, medication management and review of any available recent lab and radiology data.  Preventive health is updated, specifically  Cancer screening and Immunization.    The PT denies any adverse reactions to current medications since the last visit. Compliance with treatment remains a challenge, and states she has not seen caseworker for some time C/o extreme sadness, crying often, also hallucinating, denies suicidal or homicidal ideation, no interest in psych help but will attempt follow through on this  C/o polyuria, polydipsia and fatigue      Review of Systems See HPI Denies recent fever or chills. Denies sinus pressure, nasal congestion, ear pain or sore throat. Denies chest congestion, productive cough or wheezing. Denies chest pains, palpitations and leg swelling Denies abdominal pain, nausea, vomiting,diarrhea or constipation.   Denies dysuria, frequency, hesitancy or incontinence. Denies joint pain, swelling and limitation in mobility. Denies headaches, seizures, numbness, or tingling.  Denies skin break down or rash.        Objective:   Physical Exam BP 130/88 mmHg  Pulse 92  Resp 16  Ht 5\' 6"  (1.676 m)  Wt 144 lb (65.318 kg)  BMI 23.25 kg/m2  SpO2 97%  LMP 06/23/2010 Patient alert and oriented and in no cardiopulmonary distress.  HEENT: No facial asymmetry, EOMI,   oropharynx pink and moist.  Neck supple no JVD, no mass.  Chest: Clear to auscultation bilaterally.  CVS: S1, S2 no murmurs, no S3.Regular rate.  ABD: Soft non tender.   Ext: No edema  MS: Adequate ROM spine, shoulders, hips and knees.  Skin: Intact, no ulcerations or rash noted.  Psych:Poor eye contact, normal affect. Memory impaired, anxious and  depressed appearing.  CNS: CN 2-12 intact, power,  normal  throughout.no focal deficits noted.        Assessment & Plan:  HTN, goal below 130/80 Uncontrolled, medication compliance a challenge , importance of same discussed, need to contact case worker/ nurse asisting pt to see exactly what med pt is taking regulalrly   Diabetes mellitus, insulin dependent (IDDM), uncontrolled Deteriorated, not taking insulin though recommended by endo. Needs to r establish with endo, will try to arrange this   Anxiety and depression Uncontrolled, not suicidal or homicidal Reports hallucinations Continues to refuse mental health treatment   Hyposomnia, insomnia or sleeplessness associated with anxiety Sleep hygiene reviewed and written information offered also. Prescription sent for  medication needed.    Nicotine dependence Unchanged Patient counseled for approximately 5 minutes regarding the health risks of ongoing nicotine use, specifically all types of cancer, heart disease, stroke and respiratory failure. The options available for help with cessation ,the behavioral changes to assist the process, and the option to either gradully reduce usage  Or abruptly stop.is also discussed. Pt is also encouraged to set specific goals in number of cigarettes used daily, as well as to set a quit date.  Number of cigarettes/cigars currently smoking daily: 15 to 20    Drug dependence, continuous abuse Continued drug use, encouraged attendance at meetings and discontinuation of drugs   Need for vaccination with 13-polyvalent pneumococcal conjugate vaccine After obtaining informed consent, the vaccine is  administered by LPN.

## 2014-07-03 NOTE — Assessment & Plan Note (Signed)
Deteriorated, not taking insulin though recommended by endo. Needs to r establish with endo, will try to arrange this

## 2014-07-03 NOTE — Assessment & Plan Note (Signed)
Uncontrolled, not suicidal or homicidal Reports hallucinations Continues to refuse mental health treatment

## 2014-07-03 NOTE — Assessment & Plan Note (Signed)
Uncontrolled, medication compliance a challenge , importance of same discussed, need to contact case worker/ nurse asisting pt to see exactly what med pt is taking regulalrly

## 2014-07-03 NOTE — Assessment & Plan Note (Signed)
After obtaining informed consent, the vaccine is  administered by LPN.  

## 2014-07-03 NOTE — Assessment & Plan Note (Signed)
Sleep hygiene reviewed and written information offered also. Prescription sent for  medication needed.  

## 2014-07-03 NOTE — Assessment & Plan Note (Signed)
Unchanged Patient counseled for approximately 5 minutes regarding the health risks of ongoing nicotine use, specifically all types of cancer, heart disease, stroke and respiratory failure. The options available for help with cessation ,the behavioral changes to assist the process, and the option to either gradully reduce usage  Or abruptly stop.is also discussed. Pt is also encouraged to set specific goals in number of cigarettes used daily, as well as to set a quit date.  Number of cigarettes/cigars currently smoking daily: 15 to 20

## 2014-07-03 NOTE — Assessment & Plan Note (Signed)
Continued drug use, encouraged attendance at meetings and discontinuation of drugs

## 2014-07-04 ENCOUNTER — Ambulatory Visit: Payer: Medicaid Other | Admitting: Infectious Disease

## 2014-07-06 ENCOUNTER — Other Ambulatory Visit: Payer: Self-pay | Admitting: Family Medicine

## 2014-07-06 ENCOUNTER — Other Ambulatory Visit: Payer: Self-pay

## 2014-07-06 MED ORDER — GLIPIZIDE ER 10 MG PO TB24: 10.0000 mg | ORAL_TABLET | Freq: Every day | ORAL | Status: DC

## 2014-07-17 ENCOUNTER — Ambulatory Visit (INDEPENDENT_AMBULATORY_CARE_PROVIDER_SITE_OTHER): Payer: Medicaid Other | Admitting: Infectious Disease

## 2014-07-17 ENCOUNTER — Encounter: Payer: Self-pay | Admitting: Infectious Disease

## 2014-07-17 VITALS — BP 120/79 | HR 102 | Temp 98.2°F | Wt 143.0 lb

## 2014-07-17 DIAGNOSIS — J302 Other seasonal allergic rhinitis: Secondary | ICD-10-CM

## 2014-07-17 DIAGNOSIS — E1065 Type 1 diabetes mellitus with hyperglycemia: Secondary | ICD-10-CM

## 2014-07-17 DIAGNOSIS — B182 Chronic viral hepatitis C: Secondary | ICD-10-CM

## 2014-07-17 DIAGNOSIS — R630 Anorexia: Secondary | ICD-10-CM

## 2014-07-17 DIAGNOSIS — I10 Essential (primary) hypertension: Secondary | ICD-10-CM | POA: Diagnosis not present

## 2014-07-17 DIAGNOSIS — Z794 Long term (current) use of insulin: Secondary | ICD-10-CM

## 2014-07-17 DIAGNOSIS — E1165 Type 2 diabetes mellitus with hyperglycemia: Secondary | ICD-10-CM

## 2014-07-17 DIAGNOSIS — IMO0001 Reserved for inherently not codable concepts without codable children: Secondary | ICD-10-CM

## 2014-07-17 DIAGNOSIS — F32A Depression, unspecified: Secondary | ICD-10-CM

## 2014-07-17 DIAGNOSIS — F329 Major depressive disorder, single episode, unspecified: Secondary | ICD-10-CM

## 2014-07-17 HISTORY — DX: Anorexia: R63.0

## 2014-07-17 MED ORDER — FLUTICASONE PROPIONATE 50 MCG/ACT NA SUSP: 1.0000 | Freq: Every evening | NASAL | Status: DC | PRN

## 2014-07-17 MED ORDER — DRONABINOL 5 MG PO CAPS: 5.0000 mg | ORAL_CAPSULE | Freq: Two times a day (BID) | ORAL | Status: DC

## 2014-07-17 MED ORDER — GLIPIZIDE ER 10 MG PO TB24: 10.0000 mg | ORAL_TABLET | Freq: Every day | ORAL | Status: DC

## 2014-07-17 MED ORDER — MIRTAZAPINE 15 MG PO TABS: 15.0000 mg | ORAL_TABLET | Freq: Every day | ORAL | Status: DC

## 2014-07-17 MED ORDER — METFORMIN HCL 500 MG PO TABS: ORAL_TABLET | ORAL | Status: DC

## 2014-07-17 MED ORDER — RAMIPRIL 5 MG PO CAPS: 5.0000 mg | ORAL_CAPSULE | Freq: Every day | ORAL | Status: DC

## 2014-07-17 NOTE — Progress Notes (Signed)
Subjective:    Patient ID: Latoya Walsh, female    DOB: 02-11-1961, 54 y.o.   MRN: [id number]  HPI  54 year old Latoya Walsh female twin sister of one of my other patient's. Latoya Walsh has hepatitis C mono infection and is NOT immune to Hep A or Hep B.  Her viral load was 3,175,000 463 copies and her genotype was 1A.  She has now  completed 3/3 of VIEKIRA pack + RIBAVIRIN.  She is here for followup and has no complaints. Not clear to me how compliant she is with her other meds and appears that some are being shipped from [place] and some from Pharmacy in [place].  I will send her non Hep C drugs to [facility] for starters and we will ask Latoya Walsh with [facility]  to help with adherence to meds.  She also c/o of poor appetite and I gave her a one time rx for marinol--though I have told her she would have to take this up further with Dr. Moshe Walsh.  Review of Systems  Constitutional: Positive for appetite change and fatigue. Negative for fever, chills, diaphoresis, activity change and unexpected weight change.  HENT: Negative for congestion, sinus pressure, sneezing and sore throat.   Eyes: Negative for photophobia and visual disturbance.  Respiratory: Negative for cough and shortness of breath.   Gastrointestinal: Negative for nausea, vomiting, abdominal pain, diarrhea and abdominal distention.  Genitourinary: Negative for dysuria, hematuria, flank pain and difficulty urinating.  Musculoskeletal: Positive for back pain. Negative for myalgias, joint swelling, arthralgias and gait problem.  Skin: Negative for color change, pallor, rash and wound.  Neurological: Positive for headaches. Negative for dizziness, tremors, weakness and light-headedness.  Hematological: Negative for adenopathy. Does not bruise/bleed easily.  Psychiatric/Behavioral: Negative for behavioral problems, confusion, sleep disturbance, dysphoric mood, decreased concentration and agitation.         Objective:   Physical Exam  Constitutional: She is oriented to person, place, and time. She appears well-developed and well-nourished. No distress.  HENT:  Head: Normocephalic and atraumatic.  Mouth/Throat: No oropharyngeal exudate.  Eyes: Conjunctivae and EOM are normal. No scleral icterus.  Neck: Normal range of motion. Neck supple.  Cardiovascular: Normal rate and regular rhythm.   Pulmonary/Chest: Effort normal. No respiratory distress. She has no wheezes.  Abdominal: She exhibits no distension.  Musculoskeletal: She exhibits no edema or tenderness.  Neurological: She is alert and oriented to person, place, and time. She exhibits normal muscle tone. Coordination normal.  Skin: Skin is warm and dry. No rash noted. She is not diaphoretic. No erythema. No pallor.  Psychiatric: She has a normal mood and affect. Her behavior is normal. Judgment and thought content normal.          Assessment & Plan:   Chronic Hepatitis C without hepatic coma:  --check HCV viral load to see if we have SVR 12 ie CURE --avoid any IVDU, condoms provided --avoid hepatoxins including etoh   I spent greater than 25 minutes with the patient including greater than 50% of time in face to face counsel of the patient and in coordination of their care.   DM: I will send her meds to [facility] on [address] to be shipped to her. Will have Latoya Walsh engage with her and twin sister. Hopefully everything can be worked out so that they both comply with their DM and primary care needs with Dr. Moshe Walsh and Latoya Walsh's sister contijnues to take her ARV for her B20 consistently  Poor  appetite: wrote one time script for marinol see above  HTN: hopefully actually taking meds BP systolic not bad

## 2014-07-18 LAB — HEPATITIS C RNA QUANTITATIVE: HCV QUANT: NOT DETECTED [IU]/mL (ref <15)

## 2014-07-22 DIAGNOSIS — Z91199 Patient's noncompliance with other medical treatment and regimen due to unspecified reason: Secondary | ICD-10-CM | POA: Insufficient documentation

## 2014-07-22 DIAGNOSIS — Z9119 Patient's noncompliance with other medical treatment and regimen: Secondary | ICD-10-CM | POA: Insufficient documentation

## 2014-07-22 NOTE — Assessment & Plan Note (Signed)
Ongoing effort being made to get pt to engage with local community resources and Providers needed to improve her health, no commitment made

## 2014-09-27 ENCOUNTER — Telehealth: Payer: Self-pay | Admitting: Family Medicine

## 2014-09-27 DIAGNOSIS — M5440 Lumbago with sciatica, unspecified side: Secondary | ICD-10-CM

## 2014-09-27 NOTE — Telephone Encounter (Signed)
Do you agree with referral?

## 2014-09-27 NOTE — Telephone Encounter (Signed)
pls refer I will sign 

## 2014-09-27 NOTE — Addendum Note (Signed)
Addended by: Denman George B on: 09/27/2014 10:06 AM   Modules accepted: Orders

## 2014-09-27 NOTE — Telephone Encounter (Signed)
Referral entered  

## 2014-10-19 ENCOUNTER — Emergency Department (HOSPITAL_COMMUNITY)
Admission: EM | Admit: 2014-10-19 | Discharge: 2014-10-19 | Disposition: A | Discharge status: Home / Self Care | Payer: Medicaid Other | Attending: Emergency Medicine | Admitting: Emergency Medicine

## 2014-10-19 ENCOUNTER — Encounter (HOSPITAL_COMMUNITY): Payer: Self-pay | Admitting: Emergency Medicine

## 2014-10-19 DIAGNOSIS — M545 Low back pain: Secondary | ICD-10-CM | POA: Insufficient documentation

## 2014-10-19 DIAGNOSIS — J020 Streptococcal pharyngitis: Secondary | ICD-10-CM

## 2014-10-19 DIAGNOSIS — G8929 Other chronic pain: Secondary | ICD-10-CM | POA: Insufficient documentation

## 2014-10-19 DIAGNOSIS — F329 Major depressive disorder, single episode, unspecified: Secondary | ICD-10-CM | POA: Insufficient documentation

## 2014-10-19 DIAGNOSIS — R51 Headache: Secondary | ICD-10-CM | POA: Diagnosis not present

## 2014-10-19 DIAGNOSIS — Z792 Long term (current) use of antibiotics: Secondary | ICD-10-CM | POA: Diagnosis not present

## 2014-10-19 DIAGNOSIS — E119 Type 2 diabetes mellitus without complications: Secondary | ICD-10-CM | POA: Insufficient documentation

## 2014-10-19 DIAGNOSIS — R590 Localized enlarged lymph nodes: Secondary | ICD-10-CM | POA: Insufficient documentation

## 2014-10-19 DIAGNOSIS — Z880 Allergy status to penicillin: Secondary | ICD-10-CM | POA: Diagnosis not present

## 2014-10-19 DIAGNOSIS — R509 Fever, unspecified: Secondary | ICD-10-CM | POA: Diagnosis not present

## 2014-10-19 DIAGNOSIS — Z79899 Other long term (current) drug therapy: Secondary | ICD-10-CM | POA: Insufficient documentation

## 2014-10-19 DIAGNOSIS — Z9119 Patient's noncompliance with other medical treatment and regimen: Secondary | ICD-10-CM | POA: Insufficient documentation

## 2014-10-19 DIAGNOSIS — Z8619 Personal history of other infectious and parasitic diseases: Secondary | ICD-10-CM | POA: Diagnosis not present

## 2014-10-19 DIAGNOSIS — Z720 Tobacco use: Secondary | ICD-10-CM | POA: Insufficient documentation

## 2014-10-19 DIAGNOSIS — I10 Essential (primary) hypertension: Secondary | ICD-10-CM | POA: Insufficient documentation

## 2014-10-19 DIAGNOSIS — F419 Anxiety disorder, unspecified: Secondary | ICD-10-CM | POA: Insufficient documentation

## 2014-10-19 DIAGNOSIS — J029 Acute pharyngitis, unspecified: Secondary | ICD-10-CM | POA: Diagnosis present

## 2014-10-19 HISTORY — DX: Allergic rhinitis, unspecified: J30.9

## 2014-10-19 HISTORY — DX: Dorsalgia, unspecified: M54.9

## 2014-10-19 HISTORY — DX: Other chronic pain: G89.29

## 2014-10-19 HISTORY — DX: Patient's noncompliance with other medical treatment and regimen due to unspecified reason: Z91.199

## 2014-10-19 HISTORY — DX: Patient's noncompliance with other medical treatment and regimen: Z91.19

## 2014-10-19 LAB — URINALYSIS, ROUTINE W REFLEX MICROSCOPIC
BILIRUBIN URINE: NEGATIVE
KETONES UR: NEGATIVE mg/dL
LEUKOCYTES UA: NEGATIVE
NITRITE: NEGATIVE
Protein, ur: 30 mg/dL — AB
Specific Gravity, Urine: 1.025 (ref 1.005–1.030)
Urobilinogen, UA: 0.2 mg/dL (ref 0.0–1.0)
pH: 5.5 (ref 5.0–8.0)

## 2014-10-19 LAB — COMPREHENSIVE METABOLIC PANEL
ALK PHOS: 109 U/L (ref 38–126)
ALT: 57 U/L — ABNORMAL HIGH (ref 14–54)
AST: 40 U/L (ref 15–41)
Albumin: 4.4 g/dL (ref 3.5–5.0)
Anion gap: 11 (ref 5–15)
BUN: 16 mg/dL (ref 6–20)
CO2: 23 mmol/L (ref 22–32)
Calcium: 9.5 mg/dL (ref 8.9–10.3)
Chloride: 103 mmol/L (ref 101–111)
Creatinine, Ser: 0.97 mg/dL (ref 0.44–1.00)
GFR calc Af Amer: >60 mL/min (ref >60)
GFR calc non Af Amer: >60 mL/min (ref >60)
Glucose, Bld: 216 mg/dL — ABNORMAL HIGH (ref 65–99)
POTASSIUM: 4 mmol/L (ref 3.5–5.1)
Sodium: 137 mmol/L (ref 135–145)
TOTAL PROTEIN: 8.5 g/dL — AB (ref 6.5–8.1)
Total Bilirubin: 0.9 mg/dL (ref 0.3–1.2)

## 2014-10-19 LAB — CBC WITH DIFFERENTIAL/PLATELET
BASOS ABS: 0 10*3/uL (ref 0.0–0.1)
BASOS PCT: 0 % (ref 0–1)
EOS ABS: 0.1 10*3/uL (ref 0.0–0.7)
EOS PCT: 1 % (ref 0–5)
HCT: 46 % (ref 36.0–46.0)
Hemoglobin: 15.7 g/dL — ABNORMAL HIGH (ref 12.0–15.0)
LYMPHS ABS: 3.7 10*3/uL (ref 0.7–4.0)
Lymphocytes Relative: 52 % — ABNORMAL HIGH (ref 12–46)
MCH: 31.5 pg (ref 26.0–34.0)
MCHC: 34.1 g/dL (ref 30.0–36.0)
MCV: 92.4 fL (ref 78.0–100.0)
MONO ABS: 0.4 10*3/uL (ref 0.1–1.0)
Monocytes Relative: 5 % (ref 3–12)
Neutro Abs: 3.1 10*3/uL (ref 1.7–7.7)
Neutrophils Relative %: 42 % — ABNORMAL LOW (ref 43–77)
Platelets: 158 10*3/uL (ref 150–400)
RBC: 4.98 MIL/uL (ref 3.87–5.11)
RDW: 12 % (ref 11.5–15.5)
WBC: 7.3 10*3/uL (ref 4.0–10.5)

## 2014-10-19 LAB — URINE MICROSCOPIC-ADD ON

## 2014-10-19 LAB — RAPID STREP SCREEN (MED CTR MEBANE ONLY): Streptococcus, Group A Screen (Direct): POSITIVE — AB

## 2014-10-19 LAB — LIPASE, BLOOD: LIPASE: 28 U/L (ref 22–51)

## 2014-10-19 MED ORDER — IBUPROFEN 800 MG PO TABS
800.0000 mg | ORAL_TABLET | Freq: Once | ORAL | Status: AC
  Administered 2014-10-19: 800 mg via ORAL
  Filled 2014-10-19: qty 1

## 2014-10-19 MED ORDER — AZITHROMYCIN 250 MG PO TABS: 250.0000 mg | ORAL_TABLET | Freq: Every day | ORAL | Status: DC

## 2014-10-19 MED ORDER — AZITHROMYCIN 250 MG PO TABS
500.0000 mg | ORAL_TABLET | Freq: Once | ORAL | Status: AC
  Administered 2014-10-19: 500 mg via ORAL
  Filled 2014-10-19: qty 2

## 2014-10-19 NOTE — ED Notes (Addendum)
Pt c/o mid back pain that radiates around bilateral sides. Pt also c/o pain to right side of face and throat pain. Pt denies nausea/vomiting but does report loss in appetite. Pain started yesterday around 1700.

## 2014-10-19 NOTE — Discharge Instructions (Signed)
Strep Throat °Strep throat is an infection of the throat caused by a bacteria named Streptococcus pyogenes. Your health care provider may call the infection streptococcal "tonsillitis" or "pharyngitis" depending on whether there are signs of inflammation in the tonsils or back of the throat. Strep throat is most common in children aged 52-15 years during the cold months of the year, but it can occur in people of any age during any season. This infection is spread from person to person (contagious) through coughing, sneezing, or other close contact. °SIGNS AND SYMPTOMS  °· Fever or chills. °· Painful, swollen, red tonsils or throat. °· Pain or difficulty when swallowing. °· White or yellow spots on the tonsils or throat. °· Swollen, tender lymph nodes or "glands" of the neck or under the jaw. °· Red rash all over the body (rare). °DIAGNOSIS  °Many different infections can cause the same symptoms. A test must be done to confirm the diagnosis so the right treatment can be given. A "rapid strep test" can help your health care provider make the diagnosis in a few minutes. If this test is not available, a light swab of the infected area can be used for a throat culture test. If a throat culture test is done, results are usually available in a day or two. °TREATMENT  °Strep throat is treated with antibiotic medicine. °HOME CARE INSTRUCTIONS  °· Gargle with 1 tsp of salt in 1 cup of warm water, 3-4 times per day or as needed for comfort. °· Family members who also have a sore throat or fever should be tested for strep throat and treated with antibiotics if they have the strep infection. °· Make sure everyone in your household washes their hands well. °· Do not share food, drinking cups, or personal items that could cause the infection to spread to others. °· You may need to eat a soft food diet until your sore throat gets better. °· Drink enough water and fluids to keep your urine clear or pale yellow. This will help prevent  dehydration. °· Get plenty of rest. °· Stay home from school, day care, or work until you have been on antibiotics for 24 hours. °· Take medicines only as directed by your health care provider. °· Take your antibiotic medicine as directed by your health care provider. Finish it even if you start to feel better. °SEEK MEDICAL CARE IF:  °· The glands in your neck continue to enlarge. °· You develop a rash, cough, or earache. °· You cough up green, yellow-brown, or bloody sputum. °· You have pain or discomfort not controlled by medicines. °· Your problems seem to be getting worse rather than better. °· You have a fever. °SEEK IMMEDIATE MEDICAL CARE IF:  °· You develop any new symptoms such as vomiting, severe headache, stiff or painful neck, chest pain, shortness of breath, or trouble swallowing. °· You develop severe throat pain, drooling, or changes in your voice. °· You develop swelling of the neck, or the skin on the neck becomes red and tender. °· You develop signs of dehydration, such as fatigue, dry mouth, and decreased urination. °· You become increasingly sleepy, or you cannot wake up completely. °MAKE SURE YOU: °· Understand these instructions. °· Will watch your condition. °· Will get help right away if you are not doing well or get worse. °Document Released: 03/06/2000 Document Revised: 07/24/2013 Document Reviewed: 05/08/2010 °Exit[employer]® Patient Information ©2015 [employer]. This information is not intended to replace advice given to you by   your health care provider. Make sure you discuss any questions you have with your health care provider.   Take your next doses Zithromax tomorrow morning.  You may use ibuprofen if needed for throat pain relief.

## 2014-10-22 NOTE — ED Provider Notes (Signed)
CSN: [id number]     Arrival date & time 10/19/14  0935 History   First MD Initiated Contact with Patient 10/19/14 1000     Chief Complaint  Patient presents with  . Back Pain     (Consider location/radiation/quality/duration/timing/severity/associated sxs/prior Treatment) The history is provided by the patient and a relative.   Latoya Walsh is a 54 y.o. female with past medical history significant for DM, HTN, h/o substance abuse and chronic Hepatitis C presenting with multiple medical symptoms, the primary ones being mid back pain which is achy, constant and radiates around her bilateral rib cage and sore throat and facial pain worsened since yesterday. She has had subjective fevers, generalized body aches, but denies nausea, vomiting, abdominal pain, dysuria, hematuria, cough, sob or chest pain. She has taken no medicines prior to arrival for symptom relief.      Past Medical History  Diagnosis Date  . Diabetes mellitus   . Depression   . Anxiety   . Hypertension   . Substance abuse     HX of drug use and alcohol use  . Hepatitis C   . Chronic hepatitis C without hepatic coma   . Poor appetite 07/17/2014  . Chronic back pain   . Noncompliance   . Allergic rhinitis    Past Surgical History  Procedure Laterality Date  . Appendectomy  2004   Family History  Problem Relation Age of Onset  . Diabetes Sister     Twin - AIDS    History  Substance Use Topics  . Smoking status: Current Every Day Smoker -- 0.50 packs/day    Types: Cigarettes  . Smokeless tobacco: Not on file     Comment: 1/2 pack x 40 yrs  . Alcohol Use: 0.0 oz/week    0 Standard drinks or equivalent per week     Comment: 2 16oz cans of beer a day   OB History    No data available     Review of Systems  Constitutional: Positive for fever.  HENT: Positive for sore throat. Negative for congestion and facial swelling.   Eyes: Negative.   Respiratory: Negative for cough, chest tightness, shortness of  breath, wheezing and stridor.   Cardiovascular: Negative for chest pain.  Gastrointestinal: Negative for nausea, vomiting, abdominal pain and diarrhea.  Genitourinary: Negative.  Negative for dysuria and hematuria.  Musculoskeletal: Positive for back pain. Negative for joint swelling, arthralgias and neck pain.  Skin: Negative.  Negative for rash and wound.  Neurological: Negative for dizziness, weakness, light-headedness, numbness and headaches.  Psychiatric/Behavioral: Negative.       Allergies  Penicillins  Home Medications   Prior to Admission medications   Medication Sig Start Date End Date Taking? Authorizing Provider  azithromycin (ZITHROMAX) 250 MG tablet Take 1 tablet (250 mg total) by mouth daily. 10/20/14   Evalee Jefferson, PA-C  clotrimazole-betamethasone (LOTRISONE) cream Apply topically 2 (two) times daily. 11/03/12   [name], MD  dronabinol (MARINOL) 5 MG capsule Take 1 capsule (5 mg total) by mouth 2 (two) times daily before a meal. 07/17/14   Truman Hayward, MD  fluticasone [facility]) 50 MCG/ACT nasal spray Place 1 spray into both nostrils at bedtime as needed for allergies or rhinitis. 07/17/14 07/17/15  Truman Hayward, MD  glipiZIDE (GLUCOTROL XL) 10 MG 24 hr tablet Take 1 tablet (10 mg total) by mouth daily with breakfast. 07/17/14   Truman Hayward, MD  glucose blood (PRODIGY TEST) test  strip Use as instructed for once daily testing  Dx 250.00 03/04/12   [name], MD  metFORMIN (GLUCOPHAGE) 500 MG tablet Dose increase, effective 12/07/2011. Two tablets twice daily 07/17/14   Truman Hayward, MD  mirtazapine (REMERON) 15 MG tablet Take 1 tablet (15 mg total) by mouth at bedtime. 07/17/14 07/17/15  Truman Hayward, MD  ramipril (ALTACE) 5 MG capsule Take 1 capsule (5 mg total) by mouth daily. 07/17/14 02/21/16  Truman Hayward, MD   BP 144/93 mmHg  Pulse 75  Temp(Src) 97.4 F (36.3 C) (Oral)  Resp 16  Ht 5\' 7"  (1.702 m)  Wt 140 lb  (63.504 kg)  BMI 21.92 kg/m2  SpO2 99%  LMP 06/23/2010 Physical Exam  Constitutional: She appears well-developed and well-nourished.  HENT:  Head: Normocephalic and atraumatic.  Right Ear: Tympanic membrane normal.  Left Ear: Tympanic membrane normal.  Nose: No mucosal edema or rhinorrhea.  Mouth/Throat: Posterior oropharyngeal erythema present. No oropharyngeal exudate or tonsillar abscesses.  Eyes: Conjunctivae are normal.  Neck: Normal range of motion. Neck supple.  Cardiovascular: Normal rate, regular rhythm, normal heart sounds and intact distal pulses.   Pulmonary/Chest: Effort normal and breath sounds normal. She has no wheezes.  Abdominal: Soft. Bowel sounds are normal. There is no tenderness. There is no rebound and no guarding.  No cva tenderness  Musculoskeletal: Normal range of motion.       Cervical back: She exhibits no bony tenderness.       Thoracic back: She exhibits no bony tenderness.       Lumbar back: She exhibits no bony tenderness.  Lymphadenopathy:    She has cervical adenopathy.  Neurological: She is alert.  Skin: Skin is warm and dry.  Psychiatric: She has a normal mood and affect.  Nursing note and vitals reviewed.   ED Course  Procedures (including critical care time) Labs Review Labs Reviewed  RAPID STREP SCREEN (NOT AT [facility]) - Abnormal; Notable for the following:    Streptococcus, Group A Screen (Direct) POSITIVE (*)    All other components within normal limits  COMPREHENSIVE METABOLIC PANEL - Abnormal; Notable for the following:    Glucose, Bld 216 (*)    Total Protein 8.5 (*)    ALT 57 (*)    All other components within normal limits  URINALYSIS, ROUTINE W REFLEX MICROSCOPIC (NOT AT [facility]) - Abnormal; Notable for the following:    Glucose, UA >1000 (*)    Hgb urine dipstick MODERATE (*)    Protein, ur 30 (*)    All other components within normal limits  CBC WITH DIFFERENTIAL/PLATELET - Abnormal; Notable for the following:    Hemoglobin  15.7 (*)    Neutrophils Relative % 42 (*)    Lymphocytes Relative 52 (*)    All other components within normal limits  LIPASE, BLOOD  URINE MICROSCOPIC-ADD ON    Imaging Review No results found.   EKG Interpretation None      MDM   Final diagnoses:  Strep throat    Patients labs and/or radiological studies were reviewed and considered during the medical decision making and disposition process.    Results were also discussed with patient.  Pt placed on zithromax, first dose given here. Advised ibuprofen for pain relief, fever reduction, rest, increased fluid intake, f/u with pcp for a recheck if sx persist or worsen.  The patient appears reasonably screened and/or stabilized for discharge and I doubt any other medical condition or  other [facility] requiring further screening, evaluation, or treatment in the ED at this time prior to discharge.      Evalee Jefferson, PA-C 10/22/14 [place], DO 10/22/14 2154

## 2014-10-29 ENCOUNTER — Encounter: Payer: Medicaid Other | Admitting: Family Medicine

## 2015-03-27 ENCOUNTER — Ambulatory Visit (INDEPENDENT_AMBULATORY_CARE_PROVIDER_SITE_OTHER): Payer: Medicaid Other | Admitting: Family Medicine

## 2015-03-27 ENCOUNTER — Encounter: Payer: Self-pay | Admitting: Family Medicine

## 2015-03-27 VITALS — BP 148/90 | HR 92 | Resp 16 | Ht 67.0 in | Wt 135.8 lb

## 2015-03-27 DIAGNOSIS — B182 Chronic viral hepatitis C: Secondary | ICD-10-CM

## 2015-03-27 DIAGNOSIS — R195 Other fecal abnormalities: Secondary | ICD-10-CM | POA: Diagnosis not present

## 2015-03-27 DIAGNOSIS — Z23 Encounter for immunization: Secondary | ICD-10-CM | POA: Diagnosis not present

## 2015-03-27 DIAGNOSIS — I10 Essential (primary) hypertension: Secondary | ICD-10-CM

## 2015-03-27 DIAGNOSIS — E1065 Type 1 diabetes mellitus with hyperglycemia: Secondary | ICD-10-CM

## 2015-03-27 DIAGNOSIS — R1013 Epigastric pain: Secondary | ICD-10-CM

## 2015-03-27 DIAGNOSIS — M25561 Pain in right knee: Secondary | ICD-10-CM | POA: Diagnosis not present

## 2015-03-27 DIAGNOSIS — E108 Type 1 diabetes mellitus with unspecified complications: Secondary | ICD-10-CM

## 2015-03-27 DIAGNOSIS — M5416 Radiculopathy, lumbar region: Secondary | ICD-10-CM | POA: Insufficient documentation

## 2015-03-27 DIAGNOSIS — IMO0002 Reserved for concepts with insufficient information to code with codable children: Secondary | ICD-10-CM

## 2015-03-27 DIAGNOSIS — F17208 Nicotine dependence, unspecified, with other nicotine-induced disorders: Secondary | ICD-10-CM

## 2015-03-27 MED ORDER — DICYCLOMINE HCL 10 MG PO CAPS: 10.0000 mg | ORAL_CAPSULE | Freq: Three times a day (TID) | ORAL | Status: DC

## 2015-03-27 MED ORDER — RANITIDINE HCL 150 MG PO TABS: 150.0000 mg | ORAL_TABLET | Freq: Two times a day (BID) | ORAL | Status: DC

## 2015-03-27 MED ORDER — DIPHENOXYLATE-ATROPINE 2.5-0.025 MG PO TABS: 1.0000 | ORAL_TABLET | Freq: Four times a day (QID) | ORAL | Status: DC | PRN

## 2015-03-27 NOTE — Patient Instructions (Addendum)
Annual excam in 6 weeks, call if you need me before  You are treated for acute gastroeneteritis , and abdominal pain  Flu vaccine today  You are referred to Dr Luna Glasgow re right knee pain  Hope you are better soon  CBC, lipid, cmp and EGFr, hBA1C, Tsh

## 2015-03-27 NOTE — Progress Notes (Signed)
Subjective:    Patient ID: Latoya Walsh, female    DOB: 03-Jan-1961, 55 y.o.   MRN: [id number]  HPI   Latoya Walsh     MRN: [id number]      DOB: 25-Feb-1961   HPI Latoya Walsh is here for follow up and re-evaluation of chronic medical conditions, medication management and review of any available recent lab and radiology data.  Preventive health is updated, specifically  Cancer screening and Immunization.   . The PT denies any adverse reactions to current medications since the last visit.  1 day h/o acute stomach pain and cramping with diarrhea C/o increased and uncontrolled right knee pain   ROS Denies recent fever or chills. Denies sinus pressure, nasal congestion, ear pain or sore throat. Denies chest congestion, productive cough or wheezing. Denies chest pains, palpitations and leg swelling   Denies dysuria, frequency, hesitancy or incontine. Denies headaches, seizures, numbness, or tingling. Denies uncontrolled depression, anxiety or insomnia. Denies skin break down or rash.   PE  BP 148/90 mmHg  Pulse 92  Resp 16  Ht 5\' 7"  (1.702 m)  Wt 135 lb 12.8 oz (61.598 kg)  BMI 21.26 kg/m2  SpO2 99%  LMP 06/23/2010  Patient alert and oriented and in no cardiopulmonary distress.  HEENT: No facial asymmetry, EOMI,   oropharynx pink and moist.  Neck supple no JVD, no mass.  Chest: Clear to auscultation bilaterally.  CVS: S1, S2 no murmurs, no S3.Regular rate.  ABD: Soft  Diffuse superficial tenderness, hyperactive bS, no guarding or rebound, no organomegaly or mass palpated.   Ext: No edema  MS: decreased ROM right knee , otherwise normal Skin: Intact, no ulcerations or rash noted.  Psych: Good eye contact, normal affect. Memory impaired  CNS: CN 2-12 intact, power,  normal throughout.no focal deficits noted.   Assessment & Plan   Right knee pain Uncontrolled knee pain, needs ortho eval, will refer  Nicotine dependence Patient counseled for  approximately 5 minutes regarding the health risks of ongoing nicotine use, specifically all types of cancer, heart disease, stroke and respiratory failure. The options available for help with cessation ,the behavioral changes to assist the process, and the option to either gradully reduce usage  Or abruptly stop.is also discussed. Pt is also encouraged to set specific goals in number of cigarettes used daily, as well as to set a quit date.    Need for prophylactic vaccination and inoculation against influenza After obtaining informed consent, the vaccine is  administered by LPN.   Loose stools Acute onset x 1 day, likely viral illness , symptomatic treatment with  Lomotil and importance of hydration stressed  Diabetes mellitus, insulin dependent (IDDM), uncontrolled Updated lab needed past due, unable to reliably assess as not testing regularly.   Chronic hepatitis C without hepatic coma Updated viral load to be checked, has not been to the ID clinic for over 12 months  HTN, goal below 130/80 UnControlled, non compliant with medication  DASH diet and commitment to daily physical activity for a minimum of 30 minutes discussed and encouraged, as a part of hypertension management. The importance of attaining a healthy weight is also discussed.  BP/Weight 03/27/2015 10/19/2014 07/17/2014 06/11/2014 02/28/2014 02/12/2014 [id number]  Systolic BP 123456 123456 [id number]  Diastolic BP 90 93 79 88 83 92 90  Wt. (Lbs) 135.8 140 143 144 137 135.4 142  BMI 21.26 21.92 23.09 23.25 22.12 21.86 22.93  Epigastric abdominal pain Cramping abdominal pain , associated with viral illness, dicyclomine prescribed     Review of Systems     Objective:   Physical Exam        Assessment & Plan:

## 2015-04-10 ENCOUNTER — Telehealth: Payer: Self-pay | Admitting: Family Medicine

## 2015-04-10 DIAGNOSIS — M25561 Pain in right knee: Secondary | ICD-10-CM

## 2015-04-10 DIAGNOSIS — M549 Dorsalgia, unspecified: Secondary | ICD-10-CM

## 2015-04-10 NOTE — Telephone Encounter (Signed)
Pls let pt know she needs to have an xray of her knee and low back. If these are abnormal, then I will refer her to an ortho Doc. Explain she cannot see dr Luna Glasgow and will need to go to [facility] or [place]. I have enteresd x ray requests, she just needs to go!

## 2015-04-16 NOTE — Telephone Encounter (Signed)
Latoya Walsh is aware of Dr. Camillia Herter recommendation and she stated she would try to go have xrays done by the end of this week

## 2015-04-19 ENCOUNTER — Other Ambulatory Visit: Payer: Self-pay | Admitting: Family Medicine

## 2015-04-19 ENCOUNTER — Ambulatory Visit (HOSPITAL_COMMUNITY)
Admission: RE | Admit: 2015-04-19 | Discharge: 2015-04-19 | Disposition: A | Discharge status: Home / Self Care | Payer: Medicaid Other | Source: Ambulatory Visit | Attending: Family Medicine | Admitting: Family Medicine

## 2015-04-19 DIAGNOSIS — I739 Peripheral vascular disease, unspecified: Secondary | ICD-10-CM | POA: Diagnosis not present

## 2015-04-19 DIAGNOSIS — M549 Dorsalgia, unspecified: Secondary | ICD-10-CM | POA: Diagnosis present

## 2015-04-19 DIAGNOSIS — M25561 Pain in right knee: Secondary | ICD-10-CM

## 2015-04-19 DIAGNOSIS — M47896 Other spondylosis, lumbar region: Secondary | ICD-10-CM | POA: Diagnosis not present

## 2015-04-19 DIAGNOSIS — M8580 Other specified disorders of bone density and structure, unspecified site: Secondary | ICD-10-CM | POA: Insufficient documentation

## 2015-04-19 DIAGNOSIS — M899 Disorder of bone, unspecified: Secondary | ICD-10-CM | POA: Insufficient documentation

## 2015-04-22 ENCOUNTER — Other Ambulatory Visit: Payer: Self-pay | Admitting: Family Medicine

## 2015-04-22 ENCOUNTER — Other Ambulatory Visit: Payer: Self-pay

## 2015-04-22 LAB — HEMOGLOBIN A1C
HEMOGLOBIN A1C: 7.5 % — AB (ref <5.7)
Mean Plasma Glucose: 169 mg/dL — ABNORMAL HIGH (ref <117)

## 2015-04-22 LAB — CBC WITH DIFFERENTIAL/PLATELET
BASOS ABS: 0.1 10*3/uL (ref 0.0–0.1)
BASOS PCT: 1 % (ref 0–1)
EOS ABS: 0.1 10*3/uL (ref 0.0–0.7)
Eosinophils Relative: 1 % (ref 0–5)
HCT: 44.5 % (ref 36.0–46.0)
HEMOGLOBIN: 14.7 g/dL (ref 12.0–15.0)
LYMPHS ABS: 3.5 10*3/uL (ref 0.7–4.0)
Lymphocytes Relative: 62 % — ABNORMAL HIGH (ref 12–46)
MCH: 30.1 pg (ref 26.0–34.0)
MCHC: 33 g/dL (ref 30.0–36.0)
MCV: 91 fL (ref 78.0–100.0)
MONOS PCT: 6 % (ref 3–12)
MPV: 11.1 fL (ref 8.6–12.4)
Monocytes Absolute: 0.3 10*3/uL (ref 0.1–1.0)
NEUTROS ABS: 1.7 10*3/uL (ref 1.7–7.7)
Neutrophils Relative %: 30 % — ABNORMAL LOW (ref 43–77)
PLATELETS: 156 10*3/uL (ref 150–400)
RBC: 4.89 MIL/uL (ref 3.87–5.11)
RDW: 13.2 % (ref 11.5–15.5)
WBC: 5.7 10*3/uL (ref 4.0–10.5)

## 2015-04-22 MED ORDER — TRAMADOL HCL 50 MG PO TABS: ORAL_TABLET | ORAL | Status: DC

## 2015-04-23 LAB — COMPLETE METABOLIC PANEL WITH GFR
ALBUMIN: 4.1 g/dL (ref 3.6–5.1)
ALK PHOS: 119 U/L (ref 33–130)
ALT: 64 U/L — ABNORMAL HIGH (ref 6–29)
AST: 37 U/L — ABNORMAL HIGH (ref 10–35)
BILIRUBIN TOTAL: 0.6 mg/dL (ref 0.2–1.2)
BUN: 19 mg/dL (ref 7–25)
CALCIUM: 9.5 mg/dL (ref 8.6–10.4)
CO2: 27 mmol/L (ref 20–31)
Chloride: 103 mmol/L (ref 98–110)
Creat: 1.21 mg/dL — ABNORMAL HIGH (ref 0.50–1.05)
GFR, EST NON AFRICAN AMERICAN: 52 mL/min — AB (ref >=60)
GFR, Est African American: 59 mL/min — ABNORMAL LOW (ref >=60)
GLUCOSE: 215 mg/dL — AB (ref 65–99)
POTASSIUM: 4.8 mmol/L (ref 3.5–5.3)
Sodium: 141 mmol/L (ref 135–146)
TOTAL PROTEIN: 7 g/dL (ref 6.1–8.1)

## 2015-04-23 LAB — LIPID PANEL
CHOLESTEROL: 158 mg/dL (ref 125–200)
HDL: 54 mg/dL (ref >=46)
LDL Cholesterol: 84 mg/dL (ref <130)
Total CHOL/HDL Ratio: 2.9 Ratio (ref <=5.0)
Triglycerides: 102 mg/dL (ref <150)
VLDL: 20 mg/dL (ref <30)

## 2015-04-23 LAB — HEPATITIS PANEL, ACUTE
HCV AB: REACTIVE — AB
HEP B S AG: NEGATIVE
Hep A IgM: NONREACTIVE
Hep B C IgM: NONREACTIVE

## 2015-04-23 LAB — TSH: TSH: 1.238 u[IU]/mL (ref 0.350–4.500)

## 2015-04-26 ENCOUNTER — Other Ambulatory Visit: Payer: Self-pay

## 2015-04-26 DIAGNOSIS — B182 Chronic viral hepatitis C: Secondary | ICD-10-CM

## 2015-04-26 LAB — HEPATITIS C RNA QUANTITATIVE

## 2015-05-06 NOTE — Assessment & Plan Note (Signed)
UnControlled, non compliant with medication  DASH diet and commitment to daily physical activity for a minimum of 30 minutes discussed and encouraged, as a part of hypertension management. The importance of attaining a healthy weight is also discussed.  BP/Weight 03/27/2015 10/19/2014 07/17/2014 06/11/2014 02/28/2014 02/12/2014 [id number]  Systolic BP 123456 123456 123456 [id number]  Diastolic BP 90 93 79 88 83 92 90  Wt. (Lbs) 135.8 140 143 144 137 135.4 142  BMI 21.26 21.92 23.09 23.25 22.12 21.86 22.93

## 2015-05-06 NOTE — Assessment & Plan Note (Signed)
After obtaining informed consent, the vaccine is  administered by LPN.  

## 2015-05-06 NOTE — Assessment & Plan Note (Signed)
Cramping abdominal pain , associated with viral illness, dicyclomine prescribed

## 2015-05-06 NOTE — Assessment & Plan Note (Signed)
Acute onset x 1 day, likely viral illness , symptomatic treatment with  Lomotil and importance of hydration stressed

## 2015-05-06 NOTE — Assessment & Plan Note (Signed)

## 2015-05-06 NOTE — Assessment & Plan Note (Signed)
Updated viral load to be checked, has not been to the ID clinic for over 12 months

## 2015-05-06 NOTE — Assessment & Plan Note (Signed)
Updated lab needed past due, unable to reliably assess as not testing regularly.

## 2015-05-06 NOTE — Assessment & Plan Note (Signed)
Uncontrolled knee pain, needs ortho eval, will refer

## 2015-05-21 ENCOUNTER — Encounter: Payer: Self-pay | Admitting: Family Medicine

## 2015-05-21 ENCOUNTER — Ambulatory Visit (INDEPENDENT_AMBULATORY_CARE_PROVIDER_SITE_OTHER): Payer: Medicaid Other | Admitting: Family Medicine

## 2015-05-21 VITALS — BP 130/84 | HR 100 | Resp 18 | Ht 66.0 in | Wt 133.1 lb

## 2015-05-21 DIAGNOSIS — F32A Depression, unspecified: Secondary | ICD-10-CM

## 2015-05-21 DIAGNOSIS — Z0000 Encounter for general adult medical examination without abnormal findings: Secondary | ICD-10-CM | POA: Diagnosis not present

## 2015-05-21 DIAGNOSIS — E108 Type 1 diabetes mellitus with unspecified complications: Secondary | ICD-10-CM

## 2015-05-21 DIAGNOSIS — E1065 Type 1 diabetes mellitus with hyperglycemia: Secondary | ICD-10-CM | POA: Diagnosis not present

## 2015-05-21 DIAGNOSIS — F329 Major depressive disorder, single episode, unspecified: Secondary | ICD-10-CM

## 2015-05-21 DIAGNOSIS — IMO0002 Reserved for concepts with insufficient information to code with codable children: Secondary | ICD-10-CM

## 2015-05-21 MED ORDER — METFORMIN HCL 500 MG PO TABS: 1000.0000 mg | ORAL_TABLET | Freq: Two times a day (BID) | ORAL | Status: DC

## 2015-05-21 MED ORDER — GLIPIZIDE ER 10 MG PO TB24: 10.0000 mg | ORAL_TABLET | Freq: Every day | ORAL | Status: DC

## 2015-05-21 MED ORDER — FLUOXETINE HCL 10 MG PO TABS: 10.0000 mg | ORAL_TABLET | Freq: Every day | ORAL | Status: DC

## 2015-05-21 MED ORDER — MIRTAZAPINE 15 MG PO TABS: 15.0000 mg | ORAL_TABLET | Freq: Every day | ORAL | Status: DC

## 2015-05-21 NOTE — Patient Instructions (Addendum)
F/u in 4 month  Take all meds as directed  HBa!C and chem 7 and EGFR in 4 month

## 2015-05-21 NOTE — Progress Notes (Signed)
Subjective:    Patient ID: Latoya Walsh, female    DOB: 1960/04/28, 55 y.o.   MRN: [id number]  HPI Preventive Screening-Counseling & Management   Patient present here today for a Medicare annual wellness visit.   Current Problems (verified)   Medications Prior to Visit Allergies (verified)   PAST HISTORY  Family History (updated)  Social History Disabled female; mother of 2 Daughters and 1 Son; Seperated   Risk Factors  Current exercise habits:  Walks to and from most of the places that she goes  Dietary issues discussed:  Diabetic diet; limited due to social needs   Cardiac risk factors:   Depression Screen  (Note: if answer to either of the following is "Yes", a more complete depression screening is indicated)   Over the past two weeks, have you felt down, depressed or hopeless? Yes Over the past two weeks, have you felt little interest or pleasure in doing things? Yes Have you lost interest or pleasure in daily life? Yes Do you often feel hopeless? Yes Do you cry easily over simple problems? Yes  Activities of Daily Living  In your present state of health, do you have any difficulty performing the following activities?  Driving?: Does not have car or license Managing money?: No Feeding yourself?:No Getting from bed to chair?:No Climbing a flight of stairs?:No Preparing food and eating?:No Bathing or showering?:No Getting dressed?:No Getting to the toilet?:No Using the toilet?:No Moving around from place to place?: No  Fall Risk Assessment In the past year have you fallen or had a near fall?: Yes x 1 Are you currently taking any medications that make you dizziness?:No   Hearing Difficulties: No Do you often ask people to speak up or repeat themselves?:No Do you experience ringing or noises in your ears?:No Do you have difficulty understanding soft or whispered voices?:No  Cognitive Testing  Alert? Yes Normal Appearance?Yes  Oriented to person?  Yes Place? Yes  Time? Yes  Displays appropriate judgment?Yes  Can read the correct time from a watch face? yes Are you having problems remembering things?No  Advanced Directives have been discussed with the patient?Yes and brochure given   List the Names of Other Physician/Practitioners you currently use: careteams updated    Indicate any recent Medical Services you may have received from other than [facility] providers in the past year (date may be approximate).   Assessment:    Annual Wellness Exam   Plan:     Medicare Attestation  I have personally reviewed:  The patient's medical and social history  Their use of alcohol, tobacco or illicit drugs  Their current medications and supplements  The patient's functional ability including ADLs,fall risks, home safety risks, cognitive, and hearing and visual impairment  Diet and physical activities  Evidence for depression or mood disorders  The patient's weight, height, BMI, and visual acuity have been recorded in the chart. I have made referrals, counseling, and provided education to the patient based on review of the above and I have provided the patient with a written personalized care plan for preventive services.     Review of Systems     Objective:   Physical Exam  BP 130/84 mmHg  Pulse 100  Resp 18  Ht 5\' 6"  (1.676 m)  Wt 133 lb 1.9 oz (60.383 kg)  BMI 21.50 kg/m2  SpO2 98%  LMP 06/23/2010       Assessment & Plan:  Medicare annual wellness visit, subsequent Annual exam as documented. Counseling done  re healthy lifestyle involving commitment particularly as this relates to illicit drug use and medical non compliance. Regular seat belt use and home safety, is also discussed. Changes in health habits are decided on by the patient with goals and time frames  set for achieving them. t.

## 2015-05-24 ENCOUNTER — Other Ambulatory Visit: Payer: Self-pay

## 2015-05-26 DIAGNOSIS — Z0000 Encounter for general adult medical examination without abnormal findings: Secondary | ICD-10-CM | POA: Insufficient documentation

## 2015-05-26 NOTE — Assessment & Plan Note (Addendum)
Annual exam as documented. Counseling done  re healthy lifestyle involving commitment particularly as this relates to illicit drug use and medical non compliance. Regular seat belt use and home safety, is also discussed. Changes in health habits are decided on by the patient with goals and time frames  set for achieving them. t.

## 2015-05-30 ENCOUNTER — Other Ambulatory Visit: Payer: Self-pay

## 2015-05-30 DIAGNOSIS — I10 Essential (primary) hypertension: Secondary | ICD-10-CM

## 2015-05-30 MED ORDER — RAMIPRIL 5 MG PO CAPS: 5.0000 mg | ORAL_CAPSULE | Freq: Every day | ORAL | Status: DC

## 2015-08-16 LAB — HEMOGLOBIN A1C
Hgb A1c MFr Bld: 7.4 % — ABNORMAL HIGH (ref <5.7)
Mean Plasma Glucose: 166 mg/dL

## 2015-08-17 LAB — COMPLETE METABOLIC PANEL WITH GFR
ALBUMIN: 4.3 g/dL (ref 3.6–5.1)
ALK PHOS: 114 U/L (ref 33–130)
ALT: 59 U/L — ABNORMAL HIGH (ref 6–29)
AST: 35 U/L (ref 10–35)
BUN: 14 mg/dL (ref 7–25)
CALCIUM: 9.3 mg/dL (ref 8.6–10.4)
CO2: 25 mmol/L (ref 20–31)
Chloride: 105 mmol/L (ref 98–110)
Creat: 1 mg/dL (ref 0.50–1.05)
GFR, Est African American: 75 mL/min (ref >=60)
GFR, Est Non African American: 65 mL/min (ref >=60)
GLUCOSE: 163 mg/dL — AB (ref 65–99)
Potassium: 4.6 mmol/L (ref 3.5–5.3)
SODIUM: 144 mmol/L (ref 135–146)
TOTAL PROTEIN: 7.1 g/dL (ref 6.1–8.1)
Total Bilirubin: 0.5 mg/dL (ref 0.2–1.2)

## 2015-09-18 ENCOUNTER — Ambulatory Visit: Payer: Medicaid Other | Admitting: Family Medicine

## 2015-09-19 ENCOUNTER — Encounter: Payer: Self-pay | Admitting: Family Medicine

## 2015-09-19 ENCOUNTER — Ambulatory Visit (INDEPENDENT_AMBULATORY_CARE_PROVIDER_SITE_OTHER): Payer: Medicaid Other | Admitting: Family Medicine

## 2015-09-19 VITALS — BP 138/88 | HR 98 | Resp 16 | Ht 66.0 in | Wt 127.0 lb

## 2015-09-19 DIAGNOSIS — F32A Depression, unspecified: Secondary | ICD-10-CM

## 2015-09-19 DIAGNOSIS — F1010 Alcohol abuse, uncomplicated: Secondary | ICD-10-CM

## 2015-09-19 DIAGNOSIS — F1920 Other psychoactive substance dependence, uncomplicated: Secondary | ICD-10-CM

## 2015-09-19 DIAGNOSIS — F418 Other specified anxiety disorders: Secondary | ICD-10-CM | POA: Diagnosis not present

## 2015-09-19 DIAGNOSIS — I10 Essential (primary) hypertension: Secondary | ICD-10-CM

## 2015-09-19 DIAGNOSIS — F419 Anxiety disorder, unspecified: Secondary | ICD-10-CM

## 2015-09-19 DIAGNOSIS — M5417 Radiculopathy, lumbosacral region: Secondary | ICD-10-CM

## 2015-09-19 DIAGNOSIS — M5416 Radiculopathy, lumbar region: Secondary | ICD-10-CM

## 2015-09-19 DIAGNOSIS — F329 Major depressive disorder, single episode, unspecified: Secondary | ICD-10-CM

## 2015-09-19 DIAGNOSIS — F17218 Nicotine dependence, cigarettes, with other nicotine-induced disorders: Secondary | ICD-10-CM

## 2015-09-19 DIAGNOSIS — E118 Type 2 diabetes mellitus with unspecified complications: Secondary | ICD-10-CM | POA: Diagnosis not present

## 2015-09-19 DIAGNOSIS — Z1231 Encounter for screening mammogram for malignant neoplasm of breast: Secondary | ICD-10-CM

## 2015-09-19 NOTE — Patient Instructions (Signed)
Annual physical exam morning appointment please morning appt   HBA1C, cBc, chem 7 and EGFR  end September  Please work stopping cigarettes totally  Pls DO mammogram

## 2015-09-29 NOTE — Assessment & Plan Note (Signed)
Reports inadequate pain control, she is to take ultracet as prescribed

## 2015-09-29 NOTE — Assessment & Plan Note (Signed)
Controlled, no change in medication Latoya Walsh is reminded of the importance of commitment to daily physical activity for 30 minutes or more, as able and the need to limit carbohydrate intake to 30 to 60 grams per meal to help with blood sugar control.   The need to take medication as prescribed, test blood sugar as directed, and to call between visits if there is a concern that blood sugar is uncontrolled is also discussed.   Latoya Walsh is reminded of the importance of daily foot exam, annual eye examination, and good blood sugar, blood pressure and cholesterol control.  Diabetic Labs Latest Ref Rng 08/16/2015 04/22/2015 10/19/2014 06/29/2014 02/19/2014  HbA1c <5.7 % 7.4(H) 7.5(H) - 11.4(H) 9.2(H)  Microalbumin <2.0 mg/dL - - - - -  Micro/Creat Ratio 0.0 - 30.0 mg/g - - - - -  Chol 125 - 200 mg/dL - 158 - 152 135  HDL >=46 mg/dL - 54 - 49 27(L)  Calc LDL <130 mg/dL - 84 - 77 68  Triglycerides <150 mg/dL - 102 - 128 202(H)  Creatinine 0.50 - 1.05 mg/dL 1.00 1.21(H) 0.97 0.89 0.84   BP/Weight 09/19/2015 05/21/2015 03/27/2015 10/19/2014 07/17/2014 06/11/2014 [id number]  Systolic BP 0000000 [id number] [id number] 123456 [id number]  Diastolic BP 88 84 90 93 79 88 83  Wt. (Lbs) 127 133.12 135.8 140 143 144 137  BMI 20.51 21.5 21.26 21.92 23.09 23.25 22.12   Foot/eye exam completion dates 09/19/2015 02/12/2014  Foot Form Completion Done Done

## 2015-09-29 NOTE — Assessment & Plan Note (Signed)
Sub optimal control, med adherence is a major challenge. No change in med dose DASH diet and commitment to daily physical activity for a minimum of 30 minutes discussed and encouraged, as a part of hypertension management. The importance of attaining a healthy weight is also discussed.  BP/Weight 09/19/2015 05/21/2015 03/27/2015 10/19/2014 07/17/2014 06/11/2014 [id number]  Systolic BP 0000000 [id number] 123456 123456 123456 [id number]  Diastolic BP 88 84 90 93 79 88 83  Wt. (Lbs) 127 133.12 135.8 140 143 144 137  BMI 20.51 21.5 21.26 21.92 23.09 23.25 22.12

## 2015-09-29 NOTE — Progress Notes (Signed)
Latoya Walsh     MRN: [id number]      DOB: 1960/12/25   HPI Latoya Walsh is here for follow up and re-evaluation of chronic medical conditions, medication management and review of any available recent lab and radiology data.  Preventive health is updated, specifically  Cancer screening and Immunization.   Questions or concerns regarding consultations or procedures which the Latoya Walsh has had in the interim are  addressed. The Latoya Walsh denies any adverse reactions to current medications since the last visit.  C/o uncontrolled anxiety and depression, not suicidal or homicidal C/o chronic leg and back pain, no recent trauma Denies polyuria, polydipsia, blurred vision , or hypoglycemic episodes.   ROS Denies recent fever or chills. Denies sinus pressure, nasal congestion, ear pain or sore throat. Denies chest congestion, productive cough or wheezing. Denies chest pains, palpitations and leg swelling Denies abdominal pain, nausea, vomiting,diarrhea or constipation.   Denies dysuria, frequency, hesitancy or incontinence.  Denies headaches, seizures, numbness, or tingling. Denies skin break down or rash.   PE  BP 138/88 mmHg  Pulse 98  Resp 16  Ht 5\' 6"  (1.676 m)  Wt 127 lb (57.607 kg)  BMI 20.51 kg/m2  SpO2 98%  LMP 06/23/2010  Patient alert and oriented and in no cardiopulmonary distress.Poor eye contact , wears dark sunshades for most of the visit as is her practice  HEENT: No facial asymmetry, EOMI,   oropharynx pink and moist.  Neck supple no JVD, no mass.  Chest: Clear to auscultation bilaterally.Decreased though adequate air entry  CVS: S1, S2 no murmurs, no S3.Regular rate.  ABD: Soft non tender.   Ext: No edema  MS: Adequate ROM spine, shoulders, hips and knees.  Skin: Intact, no ulcerations or rash noted.  Psych: Good eye contact, normal affect. Memory intact not anxious or depressed appearing.  CNS: CN 2-12 intact, power,  normal throughout.no focal deficits  noted.   Assessment & Plan  Type 2 diabetes mellitus (HCC) Controlled, no change in medication Latoya Walsh is reminded of the importance of commitment to daily physical activity for 30 minutes or more, as able and the need to limit carbohydrate intake to 30 to 60 grams per meal to help with blood sugar control.   The need to take medication as prescribed, test blood sugar as directed, and to call between visits if there is a concern that blood sugar is uncontrolled is also discussed.   Latoya Walsh is reminded of the importance of daily foot exam, annual eye examination, and good blood sugar, blood pressure and cholesterol control.  Diabetic Labs Latest Ref Rng 08/16/2015 04/22/2015 10/19/2014 06/29/2014 02/19/2014  HbA1c <5.7 % 7.4(H) 7.5(H) - 11.4(H) 9.2(H)  Microalbumin <2.0 mg/dL - - - - -  Micro/Creat Ratio 0.0 - 30.0 mg/g - - - - -  Chol 125 - 200 mg/dL - 158 - 152 135  HDL >=46 mg/dL - 54 - 49 27(L)  Calc LDL <130 mg/dL - 84 - 77 68  Triglycerides <150 mg/dL - 102 - 128 202(H)  Creatinine 0.50 - 1.05 mg/dL 1.00 1.21(H) 0.97 0.89 0.84   BP/Weight 09/19/2015 05/21/2015 03/27/2015 10/19/2014 07/17/2014 06/11/2014 [id number]  Systolic BP 0000000 [id number] 123456 123456 123456 [id number]  Diastolic BP 88 84 90 93 79 88 83  Wt. (Lbs) 127 133.12 135.8 140 143 144 137  BMI 20.51 21.5 21.26 21.92 23.09 23.25 22.12   Foot/eye exam completion dates 09/19/2015 02/12/2014  Foot Form Completion Done Done  Drug dependence, continuous abuse counseled and encouraged patient to commit to quitting drug use and getting help for this from community resources, no interest at this time, unfortunately  Anxiety and depression Uncontrolled, not suicidal or homicidal, start fluoxetine Latoya Walsh would benefit from therapy but has no interest  HTN, goal below 130/80 Sub optimal control, med adherence is a major challenge. No change in med dose DASH diet and commitment to daily physical activity for a minimum of 30 minutes  discussed and encouraged, as a part of hypertension management. The importance of attaining a healthy weight is also discussed.  BP/Weight 09/19/2015 05/21/2015 03/27/2015 10/19/2014 07/17/2014 06/11/2014 [id number]  Systolic BP 0000000 [id number] 123456 123456 123456 [id number]  Diastolic BP 88 84 90 93 79 88 83  Wt. (Lbs) 127 133.12 135.8 140 143 144 137  BMI 20.51 21.5 21.26 21.92 23.09 23.25 22.12        Nicotine dependence unchanged Patient counseled for approximately 5 minutes regarding the health risks of ongoing nicotine use, specifically all types of cancer, heart disease, stroke and respiratory failure. The options available for help with cessation ,the behavioral changes to assist the process, and the option to either gradully reduce usage  Or abruptly stop.is also discussed. Latoya Walsh is also encouraged to set specific goals in number of cigarettes used daily, as well as to set a quit date.     Alcohol abuse Ongoing and unchanged, no interest in quitting at this time  Lumbar back pain with radiculopathy affecting right lower extremity Reports inadequate pain control, she is to take ultracet as prescribed

## 2015-09-29 NOTE — Assessment & Plan Note (Signed)
counseled and encouraged patient to commit to quitting drug use and getting help for this from community resources, no interest at this time, unfortunately

## 2015-09-29 NOTE — Assessment & Plan Note (Signed)
unchanged Patient counseled for approximately 5 minutes regarding the health risks of ongoing nicotine use, specifically all types of cancer, heart disease, stroke and respiratory failure. The options available for help with cessation ,the behavioral changes to assist the process, and the option to either gradully reduce usage  Or abruptly stop.is also discussed. Pt is also encouraged to set specific goals in number of cigarettes used daily, as well as to set a quit date.  

## 2015-09-29 NOTE — Assessment & Plan Note (Addendum)
Uncontrolled, not suicidal or homicidal, start fluoxetine Pt would benefit from therapy but has no interest

## 2015-09-29 NOTE — Assessment & Plan Note (Signed)
Ongoing and unchanged, no interest in quitting at this time

## 2015-10-02 ENCOUNTER — Ambulatory Visit (HOSPITAL_COMMUNITY)
Admission: RE | Admit: 2015-10-02 | Discharge: 2015-10-02 | Disposition: A | Discharge status: Home / Self Care | Payer: Medicaid Other | Source: Ambulatory Visit | Attending: Family Medicine | Admitting: Family Medicine

## 2015-10-02 DIAGNOSIS — Z1231 Encounter for screening mammogram for malignant neoplasm of breast: Secondary | ICD-10-CM | POA: Insufficient documentation

## 2016-02-11 LAB — CBC
HEMATOCRIT: 43.9 % (ref 35.0–45.0)
Hemoglobin: 14.5 g/dL (ref 11.7–15.5)
MCH: 30.6 pg (ref 27.0–33.0)
MCHC: 33 g/dL (ref 32.0–36.0)
MCV: 92.6 fL (ref 80.0–100.0)
MPV: 10.4 fL (ref 7.5–12.5)
Platelets: 168 10*3/uL (ref 140–400)
RBC: 4.74 MIL/uL (ref 3.80–5.10)
RDW: 12.8 % (ref 11.0–15.0)
WBC: 5.3 10*3/uL (ref 3.8–10.8)

## 2016-02-11 LAB — HEMOGLOBIN A1C
Hgb A1c MFr Bld: 6.4 % — ABNORMAL HIGH (ref <5.7)
MEAN PLASMA GLUCOSE: 137 mg/dL

## 2016-02-12 LAB — BASIC METABOLIC PANEL WITH GFR
BUN: 14 mg/dL (ref 7–25)
CALCIUM: 9.3 mg/dL (ref 8.6–10.4)
CO2: 29 mmol/L (ref 20–31)
Chloride: 109 mmol/L (ref 98–110)
Creat: 0.9 mg/dL (ref 0.50–1.05)
GFR, EST NON AFRICAN AMERICAN: 74 mL/min (ref >=60)
GFR, Est African American: 85 mL/min (ref >=60)
GLUCOSE: 151 mg/dL — AB (ref 65–99)
POTASSIUM: 4.9 mmol/L (ref 3.5–5.3)
SODIUM: 141 mmol/L (ref 135–146)

## 2016-02-17 ENCOUNTER — Ambulatory Visit (INDEPENDENT_AMBULATORY_CARE_PROVIDER_SITE_OTHER): Payer: Medicaid Other | Admitting: Family Medicine

## 2016-02-17 ENCOUNTER — Encounter: Payer: Self-pay | Admitting: Family Medicine

## 2016-02-17 VITALS — BP 128/82 | HR 128 | Temp 99.9°F | Resp 22 | Ht 66.0 in | Wt 128.0 lb

## 2016-02-17 DIAGNOSIS — R509 Fever, unspecified: Secondary | ICD-10-CM

## 2016-02-17 LAB — POCT RAPID STREP A (OFFICE): RAPID STREP A SCREEN: NEGATIVE

## 2016-02-17 NOTE — Patient Instructions (Signed)
GO TO ER °

## 2016-02-17 NOTE — Addendum Note (Signed)
Addended by: Ova Freshwater on: 02/17/2016 04:54 PM   Modules accepted: Orders

## 2016-02-17 NOTE — Progress Notes (Signed)
Chief Complaint  Patient presents with  . URI   Acute visit Poor historian Febrile illness for 2-3 days Shaking chills and cannot get warm Nausea without vomiting Cough and congestion, no sputum Sore throat and sinus drainage No ear pain No sick contacts NOT a healthy person, undernourished and addicted, noncompliant, smoker   Patient Active Problem List   Diagnosis Date Noted  . Lumbar back pain with radiculopathy affecting right lower extremity 03/27/2015  . Non compliance with medical treatment 07/22/2014  . Poor appetite 07/17/2014  . Chronic hepatitis C without hepatic coma ([place]) 12/04/2013  . Allergic rhinitis 11/03/2012  . HTN, goal below 130/80 06/25/2011  . Anxiety and depression 02/05/2011  . Nicotine dependence 11/01/2010  . Drug dependence, continuous abuse ([place]) 03/22/2010  . Hyposomnia, insomnia or sleeplessness associated with anxiety 08/19/2009  . Type 2 diabetes mellitus ([place]) 06/02/2007  . Alcohol abuse 06/02/2007    Outpatient Encounter Prescriptions as of 02/17/2016  Medication Sig  . FLUoxetine (PROZAC) 10 MG tablet Take 1 tablet (10 mg total) by mouth daily.  [name] glipiZIDE (GLUCOTROL XL) 10 MG 24 hr tablet Take 1 tablet (10 mg total) by mouth daily with breakfast.  . metFORMIN (GLUCOPHAGE) 500 MG tablet Take 2 tablets (1,000 mg total) by mouth 2 (two) times daily with a meal.  . ramipril (ALTACE) 5 MG capsule Take 1 capsule (5 mg total) by mouth daily.  . traMADol (ULTRAM) 50 MG tablet One tablet daily, as needed, for back and knee pain  . glucose blood (PRODIGY TEST) test strip Use as instructed for once daily testing  Dx 250.00 (Patient not taking: Reported on 02/17/2016)   No facility-administered encounter medications on file as of 02/17/2016.     Allergies  Allergen Reactions  . Penicillins Itching and Rash    Review of Systems  Constitutional: Positive for activity change, appetite change, chills, diaphoresis, fatigue and fever.    HENT: Positive for congestion, sinus pain, sinus pressure and sore throat.   Eyes: Positive for photophobia. Negative for visual disturbance.  Respiratory: Positive for cough and shortness of breath. Negative for wheezing.   Cardiovascular: Negative for chest pain, palpitations and leg swelling.  Gastrointestinal: Positive for nausea. Negative for constipation, diarrhea and vomiting.  Genitourinary: Negative for difficulty urinating and dysuria.  Musculoskeletal: Positive for myalgias.  Skin: Negative for rash.  Neurological: Positive for dizziness and headaches.    BP 128/82 (BP Location: Right Arm, Patient Position: Supine)   Pulse (!) 128   Temp 99.9 F (37.7 C) (Oral)   Resp (!) 22   Ht 5\' 6"  (1.676 m)   Wt 128 lb 0.6 oz (58.1 kg)   LMP 06/23/2010   SpO2 97%   BMI 20.67 kg/m   Physical Exam  Constitutional: She appears well-developed and well-nourished. She appears distressed.  Appears acutely ill  HENT:  Head: Normocephalic and atraumatic.  Right Ear: External ear normal.  Left Ear: External ear normal.  Mouth/Throat: Oropharynx is clear and moist.  Post pharynx injected, no exudate. STREP NEGATIVE  Eyes: Pupils are equal, round, and reactive to light.  Neck: Normal range of motion. Neck supple.  Cardiovascular: Regular rhythm and normal heart sounds.   tachycardia  Pulmonary/Chest: Breath sounds normal.  Poor inspiratory effort, no rales  Abdominal: Soft. Bowel sounds are normal. There is no tenderness.  Lymphadenopathy:    She has no cervical adenopathy.  Neurological: She is alert.  Psychiatric:  Cooperative, appears tired    ASSESSMENT/PLAN:  1.  Acute febrile illness I explained to patient I feel she needs CXR and labs which I cannot provide.  I called the ER for report and they will see her now.   Patient Instructions  GO TO ER   Raylene Everts, MD

## 2016-03-02 ENCOUNTER — Encounter: Payer: Medicaid Other | Admitting: Family Medicine

## 2016-03-02 ENCOUNTER — Telehealth: Payer: Self-pay | Admitting: Family Medicine

## 2016-03-02 NOTE — Telephone Encounter (Signed)
Latoya Walsh is calling stating that she saw Dr. Meda Coffee the other day running a fever and a cold and she never was put on any medication and she still has a cold, please advise? Patient No showed for Appointment this morning and has since called in to R/S due to wanting medication for her cold

## 2016-03-02 NOTE — Telephone Encounter (Signed)
Spoke with patient.  She is aware that recommendation is otc Robitussin DM sugar free as her insurance does not cover cough medications.   She did reschedule follow up appt .

## 2016-04-16 ENCOUNTER — Encounter: Payer: Medicaid Other | Admitting: Family Medicine

## 2016-07-01 ENCOUNTER — Telehealth: Payer: Self-pay | Admitting: Family Medicine

## 2016-07-01 ENCOUNTER — Other Ambulatory Visit: Payer: Self-pay | Admitting: Family Medicine

## 2016-07-01 ENCOUNTER — Other Ambulatory Visit: Payer: Self-pay

## 2016-07-01 DIAGNOSIS — I10 Essential (primary) hypertension: Secondary | ICD-10-CM

## 2016-07-01 MED ORDER — RAMIPRIL 5 MG PO CAPS: 5.0000 mg | ORAL_CAPSULE | Freq: Every day | ORAL | 0 refills | Status: DC

## 2016-07-01 NOTE — Telephone Encounter (Signed)
Latoya Walsh is asking for refills on her Ramipril 5 mg , please advise

## 2016-07-01 NOTE — Telephone Encounter (Signed)
Refill sent in

## 2016-07-23 ENCOUNTER — Other Ambulatory Visit (HOSPITAL_COMMUNITY)
Admission: RE | Admit: 2016-07-23 | Discharge: 2016-07-23 | Disposition: A | Discharge status: Home / Self Care | Payer: Medicaid Other | Source: Ambulatory Visit | Attending: Family Medicine | Admitting: Family Medicine

## 2016-07-23 ENCOUNTER — Ambulatory Visit (INDEPENDENT_AMBULATORY_CARE_PROVIDER_SITE_OTHER): Payer: Medicaid Other | Admitting: Family Medicine

## 2016-07-23 ENCOUNTER — Encounter: Payer: Self-pay | Admitting: Family Medicine

## 2016-07-23 VITALS — BP 160/88 | HR 85 | Resp 16 | Ht 66.0 in | Wt 123.0 lb

## 2016-07-23 DIAGNOSIS — Z1212 Encounter for screening for malignant neoplasm of rectum: Secondary | ICD-10-CM | POA: Insufficient documentation

## 2016-07-23 DIAGNOSIS — Z9119 Patient's noncompliance with other medical treatment and regimen: Secondary | ICD-10-CM | POA: Diagnosis not present

## 2016-07-23 DIAGNOSIS — Z124 Encounter for screening for malignant neoplasm of cervix: Secondary | ICD-10-CM | POA: Diagnosis not present

## 2016-07-23 DIAGNOSIS — I10 Essential (primary) hypertension: Secondary | ICD-10-CM

## 2016-07-23 DIAGNOSIS — F419 Anxiety disorder, unspecified: Secondary | ICD-10-CM | POA: Diagnosis not present

## 2016-07-23 DIAGNOSIS — Z1211 Encounter for screening for malignant neoplasm of colon: Secondary | ICD-10-CM

## 2016-07-23 DIAGNOSIS — F329 Major depressive disorder, single episode, unspecified: Secondary | ICD-10-CM | POA: Diagnosis not present

## 2016-07-23 DIAGNOSIS — Z0000 Encounter for general adult medical examination without abnormal findings: Secondary | ICD-10-CM | POA: Diagnosis not present

## 2016-07-23 DIAGNOSIS — E118 Type 2 diabetes mellitus with unspecified complications: Secondary | ICD-10-CM

## 2016-07-23 DIAGNOSIS — Z91199 Patient's noncompliance with other medical treatment and regimen due to unspecified reason: Secondary | ICD-10-CM

## 2016-07-23 LAB — POC HEMOCCULT BLD/STL (OFFICE/1-CARD/DIAGNOSTIC): FECAL OCCULT BLD: NEGATIVE

## 2016-07-23 MED ORDER — MIRTAZAPINE 7.5 MG PO TABS
7.5000 mg | ORAL_TABLET | Freq: Every day | ORAL | 3 refills | Status: DC
Start: 2016-07-23 — End: 2017-04-19

## 2016-07-23 MED ORDER — RAMIPRIL 10 MG PO CAPS: 10.0000 mg | ORAL_CAPSULE | Freq: Every day | ORAL | 4 refills | Status: DC

## 2016-07-23 NOTE — Patient Instructions (Addendum)
f/u in 3 months, call if you need me sooner  HBA1C, fasting lipid, cmp and eGFR and tSH next week   INCREASE blood pressure medication new dose is altace 10 mg one daily  New for help with depression and apetite is Remeron one at bedtime  Tylenol tablets two in office today for headache  Thank you  for choosing [facility]. We consider it a privelige to serve you.  Delivering excellent health care in a caring and  compassionate way is our goal.  Partnering with you,  so that together we can achieve this goal is our strategy.    

## 2016-07-23 NOTE — Progress Notes (Signed)
    Latoya Walsh     MRN: [id number]      DOB: 1960-09-14  HPI: Patient is in for annual physical exam. Crying, depressed, reports poor appetite x 2 weeks, states her father recently died.Unable to say when, Reports poor sleep Immunization is reviewed , and  updated if needed.   PE: BP (!) 160/88   Pulse 85   Resp 16   Ht 5\' 6"  (1.676 m)   Wt 123 lb (55.8 kg)   LMP 06/23/2010   SpO2 98%   BMI 19.85 kg/m  Pleasant  female, alert  in no cardio-pulmonary distress. Afebrile. HEENT No facial trauma or asymetry. Sinuses non tender.  Extra occullar muscles intact,  External ears normal, tympanic membranes clear. Oropharynx moist, no exudate. Neck: supple, no adenopathy,JVD or thyromegaly.No bruits.  Chest: Clear to ascultation bilaterally.No crackles or wheezes. Non tender to palpation  Breast: No asymetry,no masses or lumps. No tenderness. No nipple discharge or inversion. No axillary or supraclavicular adenopathy  Cardiovascular system; Heart sounds normal,  S1 and  S2 ,no S3.  No murmur, or thrill. Apical beat not displaced Peripheral pulses normal.  Abdomen: Soft, non tender, no organomegaly or masses. No bruits. Bowel sounds normal. No guarding, tenderness or rebound.  Rectal:  Normal sphincter tone. No rectal mass. Guaiac negative stool.  GU: External genitalia normal female genitalia , normal female distribution of hair. No lesions. Urethral meatus normal in size, no  Prolapse, no lesions visibly  Present. Bladder non tender. Vagina pink and moist , with no visible lesions , discharge present . Adequate pelvic support no  cystocele or rectocele noted Cervix pink and appears healthy, no lesions or ulcerations noted, no discharge noted from os Uterus normal size, no adnexal masses, no cervical motion or adnexal tenderness.   Musculoskeletal exam: Full ROM of spine, hips , shoulders and knees. No deformity ,swelling or crepitus noted. No muscle  wasting or atrophy.   Neurologic: Cranial nerves 2 to 12 intact. Power, tone ,sensation and reflexes normal throughout. No disturbance in gait. No tremor.  Skin: Intact, no ulceration, erythema , scaling or rash noted. Pigmentation normal throughout  Psych; Tearful, and mildly depressed appearing  Assessment & Plan:  HTN, goal below 130/80 Uncontrolled inc altace to 10 mg daily  Type 2 diabetes mellitus ([place]) Updated lab needed at/ before next visit. States she takes one metformin once daily and is not testing  Annual physical exam Annual exam as documented.  Immunization and cancer screening needs are specifically addressed at this visit.   Anxiety and depression Not being currently treated and reports new depression with loss of father, start Remeron at bedtime, not suicidal or homicidal , and normal mood when in the company of her twin who mentioned nothing of her father's death  Non compliance with medical treatment Unchanged

## 2016-07-23 NOTE — Assessment & Plan Note (Signed)
Annual exam as documented. . Immunization and cancer screening needs are specifically addressed at this visit.  

## 2016-07-23 NOTE — Assessment & Plan Note (Signed)
Unchanged

## 2016-07-23 NOTE — Assessment & Plan Note (Signed)
Uncontrolled inc altace to 10 mg daily

## 2016-07-23 NOTE — Assessment & Plan Note (Signed)
Updated lab needed at/ before next visit. States she takes one metformin once daily and is not testing

## 2016-07-23 NOTE — Assessment & Plan Note (Signed)
Not being currently treated and reports new depression with loss of father, start Remeron at bedtime, not suicidal or homicidal , and normal mood when in the company of her twin who mentioned nothing of her father's death

## 2016-07-27 LAB — CYTOLOGY - PAP
DIAGNOSIS: NEGATIVE
HPV (WINDOPATH): NOT DETECTED

## 2016-08-03 IMAGING — MG 2D DIGITAL SCREENING BILATERAL MAMMOGRAM WITH CAD AND ADJUNCT TO
8 series · 8 of 24 positions shown · non-contrast
Comparison: Previous exam(s).

CLINICAL DATA: Screening.

EXAM:
2D DIGITAL SCREENING BILATERAL MAMMOGRAM WITH CAD AND ADJUNCT TOMO

[R MLO]
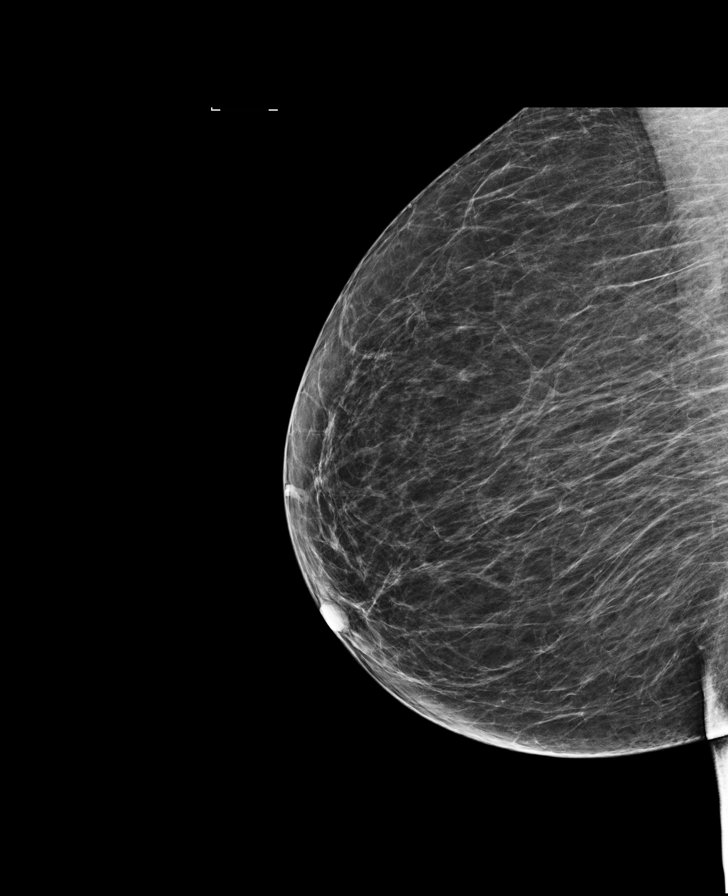

[R CC]
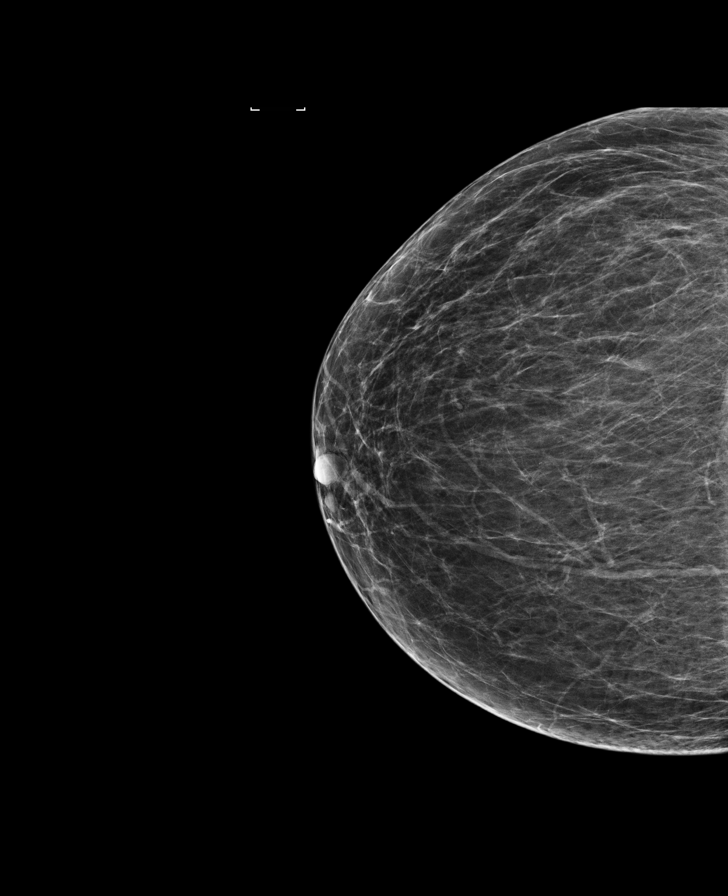

[L CC]
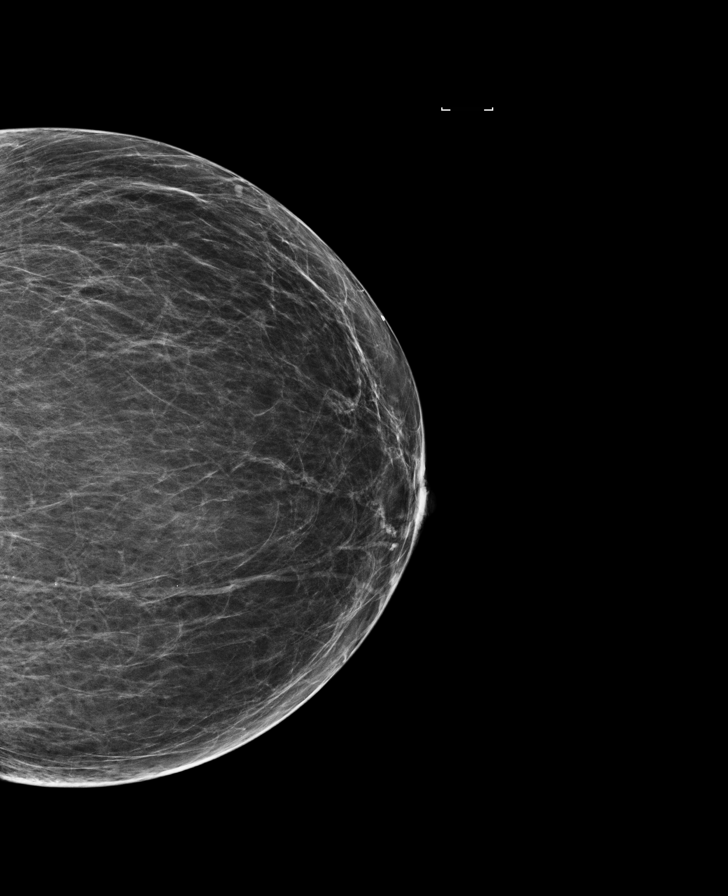

[L MLO]
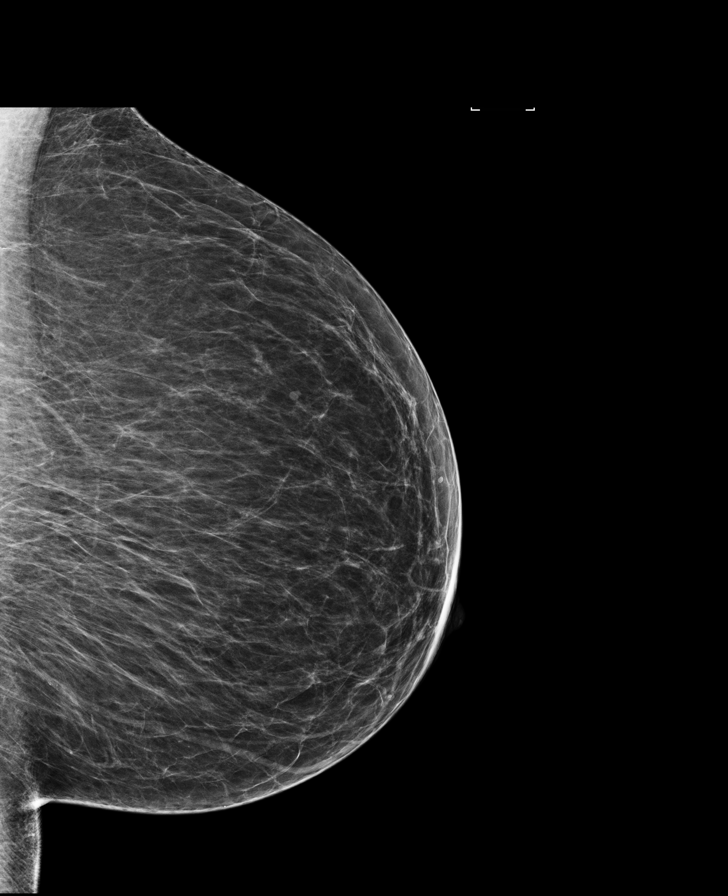

[R CC tomo · tomo slice 27/52.0]
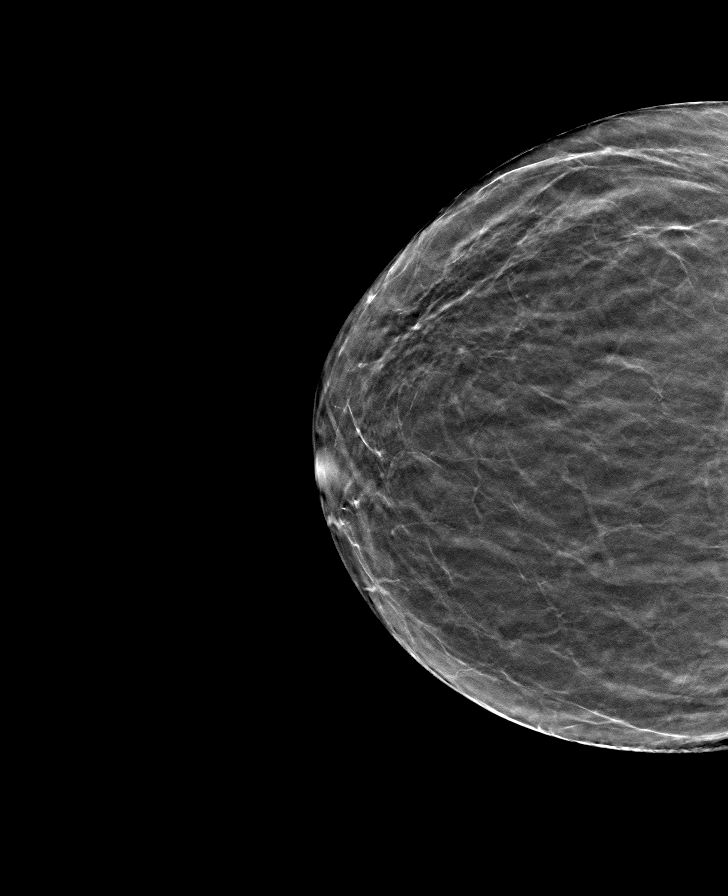

[L CC tomo · tomo slice 26/51.0]
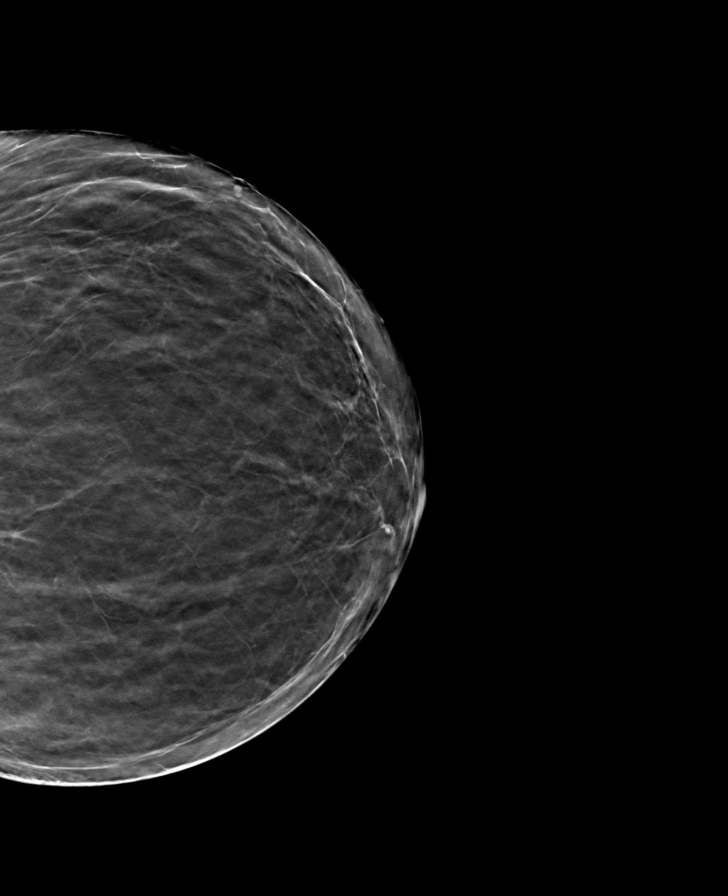

[L MLO tomo · tomo slice 27/52.0]
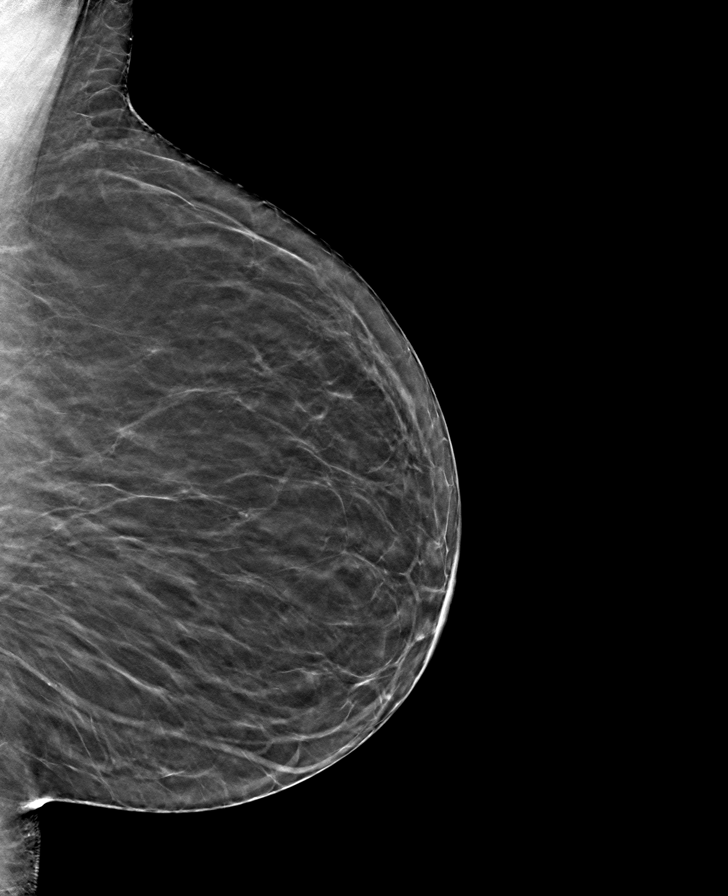

[R MLO tomo · tomo slice 27/53.0]
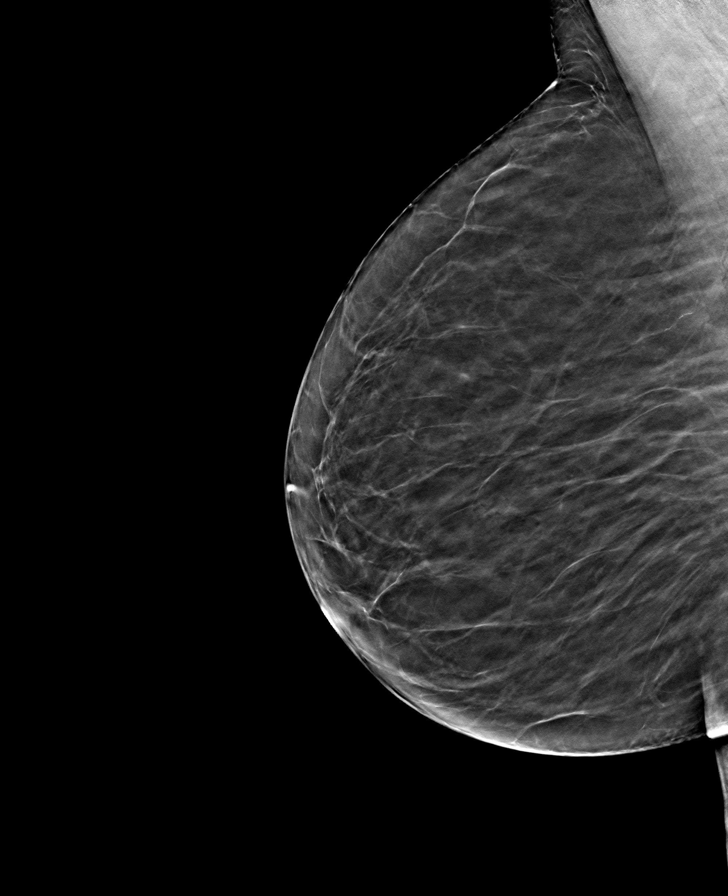

[8 of 24 positions shown; findings below may reference images not displayed]

ACR Breast Density Category b: There are scattered areas of
fibroglandular density.
FINDINGS: There are no findings suspicious for malignancy. Images were
processed with CAD.
IMPRESSION: No mammographic evidence of malignancy. A result letter of this
screening mammogram will be mailed directly to the patient.

RECOMMENDATION:
Screening mammogram in one year. (Code:97-6-RS4)

BI-RADS CATEGORY  1: Negative.

## 2016-10-02 LAB — LIPID PANEL
CHOLESTEROL: 146 mg/dL (ref <200)
HDL: 71 mg/dL (ref >50)
LDL Cholesterol: 61 mg/dL (ref <100)
TRIGLYCERIDES: 72 mg/dL (ref <150)
Total CHOL/HDL Ratio: 2.1 Ratio (ref <5.0)
VLDL: 14 mg/dL (ref <30)

## 2016-10-02 LAB — COMPLETE METABOLIC PANEL WITH GFR
ALT: 69 U/L — AB (ref 6–29)
AST: 40 U/L — AB (ref 10–35)
Albumin: 3.6 g/dL (ref 3.6–5.1)
Alkaline Phosphatase: 102 U/L (ref 33–130)
BILIRUBIN TOTAL: 0.3 mg/dL (ref 0.2–1.2)
BUN: 22 mg/dL (ref 7–25)
CALCIUM: 8.8 mg/dL (ref 8.6–10.4)
CO2: 27 mmol/L (ref 20–31)
CREATININE: 1.07 mg/dL — AB (ref 0.50–1.05)
Chloride: 109 mmol/L (ref 98–110)
GFR, EST AFRICAN AMERICAN: 68 mL/min (ref >=60)
GFR, Est Non African American: 59 mL/min — ABNORMAL LOW (ref >=60)
Glucose, Bld: 142 mg/dL — ABNORMAL HIGH (ref 65–99)
Potassium: 4.6 mmol/L (ref 3.5–5.3)
Sodium: 141 mmol/L (ref 135–146)
TOTAL PROTEIN: 6.2 g/dL (ref 6.1–8.1)

## 2016-10-02 LAB — TSH: TSH: 1.99 m[IU]/L

## 2016-10-03 LAB — HEMOGLOBIN A1C
HEMOGLOBIN A1C: 6.4 % — AB (ref <5.7)
Mean Plasma Glucose: 137 mg/dL

## 2016-11-04 ENCOUNTER — Other Ambulatory Visit: Payer: Self-pay | Admitting: Family Medicine

## 2016-11-04 ENCOUNTER — Encounter: Payer: Self-pay | Admitting: Family Medicine

## 2016-11-04 ENCOUNTER — Ambulatory Visit (INDEPENDENT_AMBULATORY_CARE_PROVIDER_SITE_OTHER): Payer: Medicaid Other | Admitting: Family Medicine

## 2016-11-04 VITALS — BP 130/76 | HR 63 | Temp 97.1°F | Resp 16 | Ht 66.0 in | Wt 137.8 lb

## 2016-11-04 DIAGNOSIS — Z1231 Encounter for screening mammogram for malignant neoplasm of breast: Secondary | ICD-10-CM

## 2016-11-04 DIAGNOSIS — I10 Essential (primary) hypertension: Secondary | ICD-10-CM

## 2016-11-04 DIAGNOSIS — F419 Anxiety disorder, unspecified: Secondary | ICD-10-CM

## 2016-11-04 DIAGNOSIS — F329 Major depressive disorder, single episode, unspecified: Secondary | ICD-10-CM

## 2016-11-04 DIAGNOSIS — F17218 Nicotine dependence, cigarettes, with other nicotine-induced disorders: Secondary | ICD-10-CM

## 2016-11-04 DIAGNOSIS — F1920 Other psychoactive substance dependence, uncomplicated: Secondary | ICD-10-CM | POA: Diagnosis not present

## 2016-11-04 NOTE — Patient Instructions (Addendum)
F/u in early December, call if you need me before  Please get mammogram appointment scheduled at checkout for a date after September 10  No change in medications  Please try to stop smoking cigarettes altogether , you need to quit  Thank you  for choosing [facility]. We consider it a privelige to serve you.  Delivering excellent health care in a caring and  compassionate way is our goal.  Partnering with you,  so that together we can achieve this goal is our strategy.

## 2016-11-07 ENCOUNTER — Encounter: Payer: Self-pay | Admitting: Family Medicine

## 2016-11-07 NOTE — Assessment & Plan Note (Addendum)
Latoya Walsh is reminded of the importance of commitment to daily physical activity for 30 minutes or more, as able and the need to limit carbohydrate intake to 30 to 60 grams per meal to help with blood sugar control.     Latoya Walsh is reminded of the importance of daily foot exam, annual eye examination, and good blood sugar, blood pressure and cholesterol control.  Diabetic Labs Latest Ref Rng & Units 10/02/2016 02/11/2016 08/16/2015 04/22/2015 10/19/2014  HbA1c <5.7 % 6.4(H) 6.4(H) 7.4(H) 7.5(H) -  Microalbumin <2.0 mg/dL - - - - -  Micro/Creat Ratio 0.0 - 30.0 mg/g - - - - -  Chol <200 mg/dL 146 - - 158 -  HDL >50 mg/dL 71 - - 54 -  Calc LDL <100 mg/dL 61 - - 84 -  Triglycerides <150 mg/dL 72 - - 102 -  Creatinine 0.50 - 1.05 mg/dL 1.07(H) 0.90 1.00 1.21(H) 0.97   BP/Weight 11/04/2016 07/23/2016 02/17/2016 09/19/2015 05/21/2015 03/27/2015 3/73/5789  Systolic BP 784 784 128 208 138 871 959  Diastolic BP 76 88 82 88 84 90 93  Wt. (Lbs) 137.75 123 128.04 127 133.12 135.8 140  BMI 22.23 19.85 20.67 20.51 21.5 21.26 21.92   Foot/eye exam completion dates 07/23/2016 09/19/2015  Foot Form Completion Done Done

## 2016-11-07 NOTE — Assessment & Plan Note (Signed)
Improved on medication , continue same 

## 2016-11-07 NOTE — Assessment & Plan Note (Signed)
Controlled, no change in medication DASH diet and commitment to daily physical activity for a minimum of 30 minutes discussed and encouraged, as a part of hypertension management. The importance of attaining a healthy weight is also discussed.  BP/Weight 11/04/2016 07/23/2016 02/17/2016 09/19/2015 05/21/2015 03/27/2015 1/64/3539  Systolic BP 122 583 462 194 712 527 129  Diastolic BP 76 88 82 88 84 90 93  Wt. (Lbs) 137.75 123 128.04 127 133.12 135.8 140  BMI 22.23 19.85 20.67 20.51 21.5 21.26 21.92

## 2016-11-07 NOTE — Progress Notes (Signed)
Latoya Walsh     MRN: [id number]      DOB: 05/18/1960   HPI Latoya Walsh is here for follow up and re-evaluation of chronic medical conditions, medication management and review of any available recent lab and radiology data.  Preventive health is updated, specifically  Cancer screening and Immunization.   Questions or concerns regarding consultations or procedures which the PT has had in the interim are  addressed. The PT denies any adverse reactions to current medications since the last visit.  C/o chronic back pain, no recent trauma There are no specific complaints   ROS Denies recent fever or chills. Denies sinus pressure, nasal congestion, ear pain or sore throat. Denies chest congestion, productive cough or wheezing. Denies chest pains, palpitations and leg swelling Denies abdominal pain, nausea, vomiting,diarrhea or constipation.   Denies dysuria, frequency, hesitancy or incontinence. C/o chronic back pain Denies headaches, seizures, numbness, or tingling. C/o  Depression,cries over deceased parents, worries over twin, not suicidal or homicidal. Denies skin break down or rash.   PE  BP 130/76 (BP Location: Left Arm, Patient Position: Sitting, Cuff Size: Normal)   Pulse 63   Temp (!) 97.1 F (36.2 C) (Other (Comment))   Resp 16   Ht 5\' 6"  (1.676 m)   Wt 137 lb 12 oz (62.5 kg)   LMP 06/23/2010   SpO2 97%   BMI 22.23 kg/m   Patient alert and oriented and in no cardiopulmonary distress.  HEENT: No facial asymmetry, EOMI,   oropharynx pink and moist.  Neck supple no JVD, no mass.  Chest: Clear to auscultation bilaterally.Decreased air entry htough adequate  CVS: S1, S2 no murmurs, no S3.Regular rate.  ABD: Soft non tender.   Ext: No edema  MS: Adequate ROM spine, shoulders, hips and knees.  Skin: Intact, no ulcerations or rash noted.  Psych: Good eye contact, normal affect, not anxious CNS: CN 2-12 intact, power,  normal throughout.no focal deficits  noted.   Assessment & Plan Anxiety and depression Improved on medication, continue same  HTN, goal below 130/80 Controlled, no change in medication DASH diet and commitment to daily physical activity for a minimum of 30 minutes discussed and encouraged, as a part of hypertension management. The importance of attaining a healthy weight is also discussed.  BP/Weight 11/04/2016 07/23/2016 02/17/2016 09/19/2015 05/21/2015 03/27/2015 0/12/2723  Systolic BP 366 440 347 425 956 387 564  Diastolic BP 76 88 82 88 84 90 93  Wt. (Lbs) 137.75 123 128.04 127 133.12 135.8 140  BMI 22.23 19.85 20.67 20.51 21.5 21.26 21.92       Type 2 diabetes mellitus ([place]) Latoya Walsh is reminded of the importance of commitment to daily physical activity for 30 minutes or more, as able and the need to limit carbohydrate intake to 30 to 60 grams per meal to help with blood sugar control.     Latoya Walsh is reminded of the importance of daily foot exam, annual eye examination, and good blood sugar, blood pressure and cholesterol control.  Diabetic Labs Latest Ref Rng & Units 10/02/2016 02/11/2016 08/16/2015 04/22/2015 10/19/2014  HbA1c <5.7 % 6.4(H) 6.4(H) 7.4(H) 7.5(H) -  Microalbumin <2.0 mg/dL - - - - -  Micro/Creat Ratio 0.0 - 30.0 mg/g - - - - -  Chol <200 mg/dL 146 - - 158 -  HDL >50 mg/dL 71 - - 54 -  Calc LDL <100 mg/dL 61 - - 84 -  Triglycerides <150 mg/dL 72 - - 102 -  Creatinine 0.50 - 1.05 mg/dL 1.07(H) 0.90 1.00 1.21(H) 0.97   BP/Weight 11/04/2016 07/23/2016 02/17/2016 09/19/2015 05/21/2015 03/27/2015 05/07/8725  Systolic BP 618 485 927 639 432 003 794  Diastolic BP 76 88 82 88 84 90 93  Wt. (Lbs) 137.75 123 128.04 127 133.12 135.8 140  BMI 22.23 19.85 20.67 20.51 21.5 21.26 21.92   Foot/eye exam completion dates 07/23/2016 09/19/2015  Foot Form Completion Done Done        Nicotine dependence Patient is asked and  confirms current  Nicotine use.  Five to seven minutes of time is spent in counseling the  patient of the need to quit smoking  Advice to quit is delivered clearly specifically in reducing the risk of developing heart disease, having a stroke, or of developing all types of cancer, especially lung and oral cancer. Improvement in breathing and exercise tolerance and quality of life is also discussed, as is the economic benefit.  Assessment of willingness to quit or to make an attempt to quit is made and documented  Assistance in quit attempt is made with several and varied options presented, based on patient's desire and need. These include  literature, local classes available, 1800 QUIT NOW number, OTC and prescription medication.  The GOAL to be NICOTINE FREE is re emphasized.  The patient has set a personal goal of either reduction or discontinuation and follow up is arranged between 6 an 16 weeks.    Drug dependence, continuous abuse Ongoing concern, no intention of discontinuing  drug use

## 2016-11-07 NOTE — Assessment & Plan Note (Signed)
Ongoing concern, no intention of discontinuing  drug use

## 2016-11-07 NOTE — Assessment & Plan Note (Signed)

## 2016-11-11 ENCOUNTER — Encounter (HOSPITAL_COMMUNITY): Payer: Self-pay | Admitting: Emergency Medicine

## 2016-11-11 ENCOUNTER — Emergency Department (HOSPITAL_COMMUNITY)
Admission: EM | Admit: 2016-11-11 | Discharge: 2016-11-11 | Disposition: A | Discharge status: Home / Self Care | Payer: Medicaid Other | Attending: Emergency Medicine | Admitting: Emergency Medicine

## 2016-11-11 DIAGNOSIS — I10 Essential (primary) hypertension: Secondary | ICD-10-CM | POA: Insufficient documentation

## 2016-11-11 DIAGNOSIS — E119 Type 2 diabetes mellitus without complications: Secondary | ICD-10-CM | POA: Diagnosis not present

## 2016-11-11 DIAGNOSIS — F17210 Nicotine dependence, cigarettes, uncomplicated: Secondary | ICD-10-CM | POA: Insufficient documentation

## 2016-11-11 DIAGNOSIS — Z79899 Other long term (current) drug therapy: Secondary | ICD-10-CM | POA: Insufficient documentation

## 2016-11-11 DIAGNOSIS — R319 Hematuria, unspecified: Secondary | ICD-10-CM

## 2016-11-11 DIAGNOSIS — N390 Urinary tract infection, site not specified: Secondary | ICD-10-CM | POA: Diagnosis not present

## 2016-11-11 DIAGNOSIS — N898 Other specified noninflammatory disorders of vagina: Secondary | ICD-10-CM | POA: Diagnosis present

## 2016-11-11 DIAGNOSIS — A599 Trichomoniasis, unspecified: Secondary | ICD-10-CM | POA: Diagnosis not present

## 2016-11-11 LAB — URINALYSIS, ROUTINE W REFLEX MICROSCOPIC
Bilirubin Urine: NEGATIVE
Glucose, UA: 50 mg/dL — AB
Ketones, ur: NEGATIVE mg/dL
Nitrite: NEGATIVE
PH: 5 (ref 5.0–8.0)
Protein, ur: 100 mg/dL — AB
SPECIFIC GRAVITY, URINE: 1.014 (ref 1.005–1.030)

## 2016-11-11 LAB — WET PREP, GENITAL
CLUE CELLS WET PREP: NONE SEEN
SPERM: NONE SEEN
Yeast Wet Prep HPF POC: NONE SEEN

## 2016-11-11 MED ORDER — ONDANSETRON HCL 4 MG PO TABS
4.0000 mg | ORAL_TABLET | Freq: Once | ORAL | Status: AC
  Administered 2016-11-11: 4 mg via ORAL
  Filled 2016-11-11: qty 1

## 2016-11-11 MED ORDER — METRONIDAZOLE 250 MG PO TABS: 250.0000 mg | ORAL_TABLET | Freq: Three times a day (TID) | ORAL | 0 refills | Status: DC

## 2016-11-11 MED ORDER — NITROFURANTOIN MONOHYD MACRO 100 MG PO CAPS: 100.0000 mg | ORAL_CAPSULE | Freq: Two times a day (BID) | ORAL | 0 refills | Status: DC

## 2016-11-11 MED ORDER — NITROFURANTOIN MONOHYD MACRO 100 MG PO CAPS
100.0000 mg | ORAL_CAPSULE | Freq: Once | ORAL | Status: AC
  Administered 2016-11-11: 100 mg via ORAL
  Filled 2016-11-11: qty 1

## 2016-11-11 NOTE — Discharge Instructions (Signed)
Your urine tests suggest a urinary tract infection. Please use the Macrobid 2 times daily with food. Your wet prep suggest trichomonas present. Please use Flagyl 3 times daily with a full meal. Please have your urine rechecked in 7 days to ensure the infection has cleared. Please refrain from all sexual activity over the next 7 days. Please do not use any alcohol products while taking the Flagyl.

## 2016-11-11 NOTE — ED Provider Notes (Signed)
[facility] DEPT Provider Note   CSN: [id number] Arrival date & time: 11/11/16  1141     History   Chief Complaint Chief Complaint  Patient presents with  . Vaginal Discharge    HPI Latoya Walsh is a 56 y.o. female.  Patient is a 56 year old female who presents to the emergency department with complaint of vaginal discharge.  The patient states she has been having problems with vaginal discharge over the last 6 days. She denies the use of any new douches, or body splashes or body washes. It is of note that she has diabetes mellitus. The patient denies any recent sexual activity. She denies being on any antibiotics recently. She has not had fever or chills. She's not had unusual bleeding from the vaginal area. She's not noticed any unusual rash present time. She describes the discharge as a yellow to green discharge.   The history is provided by the patient.  Vaginal Discharge   Pertinent negatives include no abdominal pain, no dysuria and no frequency.    Past Medical History:  Diagnosis Date  . Allergic rhinitis   . Anxiety   . Chronic back pain   . Chronic hepatitis C without hepatic coma ([place])   . Depression   . Diabetes mellitus   . Hepatitis C   . Hypertension   . Noncompliance   . Poor appetite 07/17/2014  . Substance abuse    HX of drug use and alcohol use    Patient Active Problem List   Diagnosis Date Noted  . Lumbar back pain with radiculopathy affecting right lower extremity 03/27/2015  . Non compliance with medical treatment 07/22/2014  . Chronic hepatitis C without hepatic coma ([place]) 12/04/2013  . Allergic rhinitis 11/03/2012  . HTN, goal below 130/80 06/25/2011  . Anxiety and depression 02/05/2011  . Nicotine dependence 11/01/2010  . Drug dependence, continuous abuse ([place]) 03/22/2010  . Type 2 diabetes mellitus ([place]) 06/02/2007  . Alcohol abuse 06/02/2007    Past Surgical History:  Procedure Laterality Date  . APPENDECTOMY  2004    OB  History    No data available       Home Medications    Prior to Admission medications   Medication Sig Start Date End Date Taking? Authorizing Provider  metroNIDAZOLE (FLAGYL) 250 MG tablet Take 1 tablet (250 mg total) by mouth 3 (three) times daily. 11/11/16   Lily Kocher, PA-C  mirtazapine (REMERON) 7.5 MG tablet Take 1 tablet (7.5 mg total) by mouth at bedtime. 07/23/16   [name], MD  nitrofurantoin, macrocrystal-monohydrate, (MACROBID) 100 MG capsule Take 1 capsule (100 mg total) by mouth 2 (two) times daily. 11/11/16   Lily Kocher, PA-C  ramipril (ALTACE) 10 MG capsule Take 1 capsule (10 mg total) by mouth daily. 07/23/16   [name], MD    Family History Family History  Problem Relation Age of Onset  . Diabetes Sister        Twin - AIDS     Social History Social History  Substance Use Topics  . Smoking status: Current Every Day Smoker    Packs/day: 0.25    Types: Cigarettes  . Smokeless tobacco: Never Used  . Alcohol use 24.0 oz/week    40 Cans of beer per week     Comment: 2 16oz cans of beer a day     Allergies   Penicillins   Review of Systems Review of Systems  Constitutional: Negative for activity change.  All ROS Neg except as noted in HPI  HENT: Negative for nosebleeds.   Eyes: Negative for photophobia and discharge.  Respiratory: Negative for cough, shortness of breath and wheezing.   Cardiovascular: Negative for chest pain and palpitations.  Gastrointestinal: Negative for abdominal pain and blood in stool.  Genitourinary: Positive for vaginal discharge. Negative for dysuria, frequency and hematuria.  Musculoskeletal: Negative for arthralgias, back pain and neck pain.  Skin: Negative.   Neurological: Negative for dizziness, seizures and speech difficulty.  Psychiatric/Behavioral: Negative for confusion and hallucinations.     Physical Exam Updated Vital Signs BP (!) 173/74 (BP Location: Right Arm)   Pulse 90   Temp  98.2 F (36.8 C) (Oral)   Resp 17   LMP 06/23/2010   SpO2 97%   Physical Exam  Constitutional: She is oriented to person, place, and time. She appears well-developed and well-nourished.  Non-toxic appearance.  HENT:  Head: Normocephalic.  Right Ear: Tympanic membrane and external ear normal.  Left Ear: Tympanic membrane and external ear normal.  Eyes: Pupils are equal, round, and reactive to light. EOM and lids are normal.  Neck: Normal range of motion. Neck supple. Carotid bruit is not present.  Cardiovascular: Normal rate, regular rhythm, normal heart sounds, intact distal pulses and normal pulses.   Pulmonary/Chest: Breath sounds normal. No respiratory distress.  Abdominal: Soft. Bowel sounds are normal. There is no tenderness. There is no guarding.  Genitourinary:  Genitourinary Comments: Chaperone present during examination  There is no rash, redness, or swelling of the external labia. There is yellow to yellow-green discharge in the vaginal vault. The os of the cervix is closed. There is increased redness of the cervix. No foreign body appreciated. No adnexal mass noted.  Musculoskeletal: Normal range of motion.  Lymphadenopathy:       Head (right side): No submandibular adenopathy present.       Head (left side): No submandibular adenopathy present.    She has no cervical adenopathy.  Neurological: She is alert and oriented to person, place, and time. She has normal strength. No cranial nerve deficit or sensory deficit.  Skin: Skin is warm and dry.  Psychiatric: She has a normal mood and affect. Her speech is normal.  Nursing note and vitals reviewed.    ED Treatments / Results  Labs (all labs ordered are listed, but only abnormal results are displayed) Labs Reviewed  WET PREP, GENITAL - Abnormal; Notable for the following:       Result Value   Trich, Wet Prep PRESENT (*)    WBC, Wet Prep HPF POC MANY (*)    All other components within normal limits  URINALYSIS,  ROUTINE W REFLEX MICROSCOPIC - Abnormal; Notable for the following:    APPearance HAZY (*)    Glucose, UA 50 (*)    Hgb urine dipstick MODERATE (*)    Protein, ur 100 (*)    Leukocytes, UA LARGE (*)    Bacteria, UA RARE (*)    Squamous Epithelial / LPF 0-5 (*)    All other components within normal limits  URINE CULTURE  GC/CHLAMYDIA PROBE AMP ([place]) NOT AT [facility]    EKG  EKG Interpretation None       Radiology No results found.  Procedures Procedures (including critical care time)  Medications Ordered in ED Medications  nitrofurantoin (macrocrystal-monohydrate) (MACROBID) capsule 100 mg (not administered)  ondansetron (ZOFRAN) tablet 4 mg (not administered)     Initial Impression / Assessment and Plan / ED  Course  I have reviewed the triage vital signs and the nursing notes.  Pertinent labs & imaging results that were available during my care of the patient were reviewed by me and considered in my medical decision making (see chart for details).       Final Clinical Impressions(s) / ED Diagnoses MDM Blood pressure is elevated at 173/74. Remainder the vital signs are within normal limits. Examination of the urine suggest a urinary tract infection. Examination of the wet prep suggest trichomonas. Patient will be treated with Macrobid and Flagyl. I discussed with the patient the importance of taking these with a meal. I've also discussed with her the importance of refraining from all alcohol beverages including mouthwash while taking the Flagyl. The patient will see her primary physician Dr. [name] for recheck of her urine in about 7 days. Patient is in agreement with this plan.    Final diagnoses:  Trichimoniasis  Urinary tract infection with hematuria, site unspecified    New Prescriptions New Prescriptions   METRONIDAZOLE (FLAGYL) 250 MG TABLET    Take 1 tablet (250 mg total) by mouth 3 (three) times daily.   NITROFURANTOIN, MACROCRYSTAL-MONOHYDRATE,  (MACROBID) 100 MG CAPSULE    Take 1 capsule (100 mg total) by mouth 2 (two) times daily.     Lily Kocher, PA-C 11/11/16 [place]    Nat Christen, MD 11/12/16 [phone]

## 2016-11-11 NOTE — ED Triage Notes (Signed)
Vaginal discharge on going since Thursday, denies sexually activity, or antibiotics recently

## 2016-11-12 LAB — GC/CHLAMYDIA PROBE AMP (~~LOC~~) NOT AT ARMC
CHLAMYDIA, DNA PROBE: NEGATIVE
NEISSERIA GONORRHEA: NEGATIVE

## 2016-11-13 LAB — URINE CULTURE
CULTURE: NO GROWTH
Special Requests: NORMAL

## 2016-11-30 ENCOUNTER — Ambulatory Visit (HOSPITAL_COMMUNITY)
Admission: RE | Admit: 2016-11-30 | Discharge: 2016-11-30 | Disposition: A | Discharge status: Home / Self Care | Payer: Medicaid Other | Source: Ambulatory Visit | Attending: Family Medicine | Admitting: Family Medicine

## 2016-11-30 DIAGNOSIS — Z1231 Encounter for screening mammogram for malignant neoplasm of breast: Secondary | ICD-10-CM | POA: Insufficient documentation

## 2017-02-23 ENCOUNTER — Encounter: Payer: Self-pay | Admitting: Family Medicine

## 2017-02-23 ENCOUNTER — Ambulatory Visit (INDEPENDENT_AMBULATORY_CARE_PROVIDER_SITE_OTHER): Payer: Medicaid Other | Admitting: Family Medicine

## 2017-02-23 VITALS — BP 150/90 | HR 89 | Resp 16 | Ht 66.0 in | Wt 145.0 lb

## 2017-02-23 DIAGNOSIS — F329 Major depressive disorder, single episode, unspecified: Secondary | ICD-10-CM | POA: Diagnosis not present

## 2017-02-23 DIAGNOSIS — E118 Type 2 diabetes mellitus with unspecified complications: Secondary | ICD-10-CM

## 2017-02-23 DIAGNOSIS — F17218 Nicotine dependence, cigarettes, with other nicotine-induced disorders: Secondary | ICD-10-CM

## 2017-02-23 DIAGNOSIS — F419 Anxiety disorder, unspecified: Secondary | ICD-10-CM

## 2017-02-23 DIAGNOSIS — Z9119 Patient's noncompliance with other medical treatment and regimen: Secondary | ICD-10-CM

## 2017-02-23 DIAGNOSIS — I10 Essential (primary) hypertension: Secondary | ICD-10-CM | POA: Diagnosis not present

## 2017-02-23 DIAGNOSIS — Z91199 Patient's noncompliance with other medical treatment and regimen due to unspecified reason: Secondary | ICD-10-CM

## 2017-02-23 MED ORDER — AMLODIPINE BESYLATE 5 MG PO TABS: 5.0000 mg | ORAL_TABLET | Freq: Every day | ORAL | 5 refills | Status: DC

## 2017-02-23 NOTE — Assessment & Plan Note (Addendum)
Uncontrolled , add amlodipine 5 mg  DASH diet and commitment to daily physical activity for a minimum of 30 minutes discussed and encouraged, as a part of hypertension management. The importance of attaining a healthy weight is also discussed.  BP/Weight 02/23/2017 11/11/2016 11/04/2016 07/23/2016 02/17/2016 09/19/2015 5/94/5859  Systolic BP 292 446 286 381 771 165 790  Diastolic BP 90 74 76 88 82 88 84  Wt. (Lbs) 145 - 137.75 123 128.04 127 133.12  BMI 23.4 - 22.23 19.85 20.67 20.51 21.5

## 2017-02-23 NOTE — Patient Instructions (Addendum)
F/u end March, call if you need me sooner  New additional medication, amlodipine 5 mg one daily for uncontrolled blood pressure, continue ramiprilas before  CBC, hBA1C, chem 7 and eGFR in mid March  Please work on quitting tobacco use to improve health  Reconsider flu vaccine   Steps to Quit Smoking Smoking tobacco can be bad for your health. It can also affect almost every organ in your body. Smoking puts you and people around you at risk for many serious long-lasting (chronic) diseases. Quitting smoking is hard, but it is one of the best things that you can do for your health. It is never too late to quit. What are the benefits of quitting smoking? When you quit smoking, you lower your risk for getting serious diseases and conditions. They can include:  Lung cancer or lung disease.  Heart disease.  Stroke.  Heart attack.  Not being able to have children (infertility).  Weak bones (osteoporosis) and broken bones (fractures).  If you have coughing, wheezing, and shortness of breath, those symptoms may get better when you quit. You may also get sick less often. If you are pregnant, quitting smoking can help to lower your chances of having a baby of low birth weight. What can I do to help me quit smoking? Talk with your doctor about what can help you quit smoking. Some things you can do (strategies) include:  Quitting smoking totally, instead of slowly cutting back how much you smoke over a period of time.  Going to in-person counseling. You are more likely to quit if you go to many counseling sessions.  Using resources and support systems, such as: ? Database administrator with a Social worker. ? Phone quitlines. ? Careers information officer. ? Support groups or group counseling. ? Text messaging programs. ? Mobile phone apps or applications.  Taking medicines. Some of these medicines may have nicotine in them. If you are pregnant or breastfeeding, do not take any medicines to quit  smoking unless your doctor says it is okay. Talk with your doctor about counseling or other things that can help you.  Talk with your doctor about using more than one strategy at the same time, such as taking medicines while you are also going to in-person counseling. This can help make quitting easier. What things can I do to make it easier to quit? Quitting smoking might feel very hard at first, but there is a lot that you can do to make it easier. Take these steps:  Talk to your family and friends. Ask them to support and encourage you.  Call phone quitlines, reach out to support groups, or work with a Social worker.  Ask people who smoke to not smoke around you.  Avoid places that make you want (trigger) to smoke, such as: ? Bars. ? Parties. ? Smoke-break areas at work.  Spend time with people who do not smoke.  Lower the stress in your life. Stress can make you want to smoke. Try these things to help your stress: ? Getting regular exercise. ? Deep-breathing exercises. ? Yoga. ? Meditating. ? Doing a body scan. To do this, close your eyes, focus on one area of your body at a time from head to toe, and notice which parts of your body are tense. Try to relax the muscles in those areas.  Download or buy apps on your mobile phone or tablet that can help you stick to your quit plan. There are many free apps, such as QuitGuide from the  CDC (Centers for Disease Control and Prevention). You can find more support from smokefree.gov and other websites.  This information is not intended to replace advice given to you by your health care provider. Make sure you discuss any questions you have with your health care provider. Document Released: 01/03/2009 Document Revised: 11/05/2015 Document Reviewed: 07/24/2014 Elsevier Interactive Patient Education  2018 [employer].

## 2017-02-28 ENCOUNTER — Encounter: Payer: Self-pay | Admitting: Family Medicine

## 2017-02-28 NOTE — Progress Notes (Signed)
Latoya Walsh     MRN: [id number]      DOB: 01-11-1961   HPI Latoya Walsh is here for follow up and re-evaluation of chronic medical conditions, medication management and review of any available recent lab and radiology data.  Preventive health is updated, specifically  Cancer screening and Immunization.   Questions or concerns regarding consultations or procedures which the PT has had in the interim are  addressed. The PT denies any adverse reactions to current medications since the last visit.  There are no new concerns.  There are no specific complaints  Denies polyuria, polydipsia, blurred vision , or hypoglycemic episodes.   ROS Denies recent fever or chills. Denies sinus pressure, nasal congestion, ear pain or sore throat. Denies chest congestion, productive cough or wheezing. Denies chest pains, palpitations and leg swelling Denies abdominal pain, nausea, vomiting,diarrhea or constipation.   Denies dysuria, frequency, hesitancy or incontinence. Denies joint pain, swelling and limitation in mobility. Denies headaches, seizures, numbness, or tingling. Denies uncontrolled depression, anxiety or insomnia. Denies skin break down or rash.   PE  BP (!) 150/90   Pulse 89   Resp 16   Ht 5\' 6"  (1.676 m)   Wt 145 lb (65.8 kg)   LMP 06/23/2010   SpO2 96%   BMI 23.40 kg/m   Patient alert and oriented and in no cardiopulmonary distress.  HEENT: No facial asymmetry, EOMI,   oropharynx pink and moist.  Neck supple no JVD, no mass.  Chest: Clear to auscultation bilaterally.  CVS: S1, S2 no murmurs, no S3.Regular rate.  ABD: Soft non tender.   Ext: No edema  MS: Adequate ROM spine, shoulders, hips and knees.  Skin: Intact, no ulcerations or rash noted.  Psych: Good eye contact, normal affect. Memory intact not anxious or depressed appearing.  CNS: CN 2-12 intact, power,  normal throughout.no focal deficits noted.   Assessment & Plan  HTN, goal below  130/80 Uncontrolled , add amlodipine 5 mg  DASH diet and commitment to daily physical activity for a minimum of 30 minutes discussed and encouraged, as a part of hypertension management. The importance of attaining a healthy weight is also discussed.  BP/Weight 02/23/2017 11/11/2016 11/04/2016 07/23/2016 02/17/2016 09/19/2015 4/69/6295  Systolic BP 284 132 440 102 725 366 440  Diastolic BP 90 74 76 88 82 88 84  Wt. (Lbs) 145 - 137.75 123 128.04 127 133.12  BMI 23.4 - 22.23 19.85 20.67 20.51 21.5       Nicotine dependence Patient is asked and  confirms current  Nicotine use.  Five to seven minutes of time is spent in counseling the patient of the need to quit smoking  Advice to quit is delivered clearly specifically in reducing the risk of developing heart disease, having a stroke, or of developing all types of cancer, especially lung and oral cancer. Improvement in breathing and exercise tolerance and quality of life is also discussed, as is the economic benefit.  Assessment of willingness to quit or to make an attempt to quit is made and documented  Assistance in quit attempt is made with several and varied options presented, based on patient's desire and need. These include  literature, local classes available, 1800 QUIT NOW number, OTC and prescription medication.  The GOAL to be NICOTINE FREE is re emphasized.  The patient has set a personal goal of either reduction or discontinuation and follow up is arranged between 6 an 16 weeks.    Anxiety and depression Needs  therapy but no interest in following through. Ongoing polysubstance abuse , unfortunately. Not suicidal or homicidal Continue Remeron as before  Type 2 diabetes mellitus ([place]) Latoya Walsh is reminded of the importance of commitment to daily physical activity for 30 minutes or more, as able and the need to limit carbohydrate intake to 30 to 60 grams per meal to help with blood sugar control.   The need to take  medication as prescribed, test blood sugar as directed, and to call between visits if there is a concern that blood sugar is uncontrolled is also discussed.   Latoya Walsh is reminded of the importance of daily foot exam, annual eye examination, and good blood sugar, blood pressure and cholesterol control.Updated lab needed at/ before next visit. Controlled   Diabetic Labs Latest Ref Rng & Units 10/02/2016 02/11/2016 08/16/2015 04/22/2015 10/19/2014  HbA1c <5.7 % 6.4(H) 6.4(H) 7.4(H) 7.5(H) -  Microalbumin <2.0 mg/dL - - - - -  Micro/Creat Ratio 0.0 - 30.0 mg/g - - - - -  Chol <200 mg/dL 146 - - 158 -  HDL >50 mg/dL 71 - - 54 -  Calc LDL <100 mg/dL 61 - - 84 -  Triglycerides <150 mg/dL 72 - - 102 -  Creatinine 0.50 - 1.05 mg/dL 1.07(H) 0.90 1.00 1.21(H) 0.97   BP/Weight 02/23/2017 11/11/2016 11/04/2016 07/23/2016 02/17/2016 09/19/2015 7/57/9728  Systolic BP 206 015 615 379 432 761 470  Diastolic BP 90 74 76 88 82 88 84  Wt. (Lbs) 145 - 137.75 123 128.04 127 133.12  BMI 23.4 - 22.23 19.85 20.67 20.51 21.5   Foot/eye exam completion dates 07/23/2016 09/19/2015  Foot Form Completion Done Done        Non compliance with medical treatment Ongoing challenge , 7 minutes spent counseling patient in an attempt to engage her in taking control of her health

## 2017-02-28 NOTE — Assessment & Plan Note (Signed)

## 2017-02-28 NOTE — Assessment & Plan Note (Signed)
Latoya Walsh is reminded of the importance of commitment to daily physical activity for 30 minutes or more, as able and the need to limit carbohydrate intake to 30 to 60 grams per meal to help with blood sugar control.   The need to take medication as prescribed, test blood sugar as directed, and to call between visits if there is a concern that blood sugar is uncontrolled is also discussed.   Latoya Walsh is reminded of the importance of daily foot exam, annual eye examination, and good blood sugar, blood pressure and cholesterol control.Updated lab needed at/ before next visit. Controlled   Diabetic Labs Latest Ref Rng & Units 10/02/2016 02/11/2016 08/16/2015 04/22/2015 10/19/2014  HbA1c <5.7 % 6.4(H) 6.4(H) 7.4(H) 7.5(H) -  Microalbumin <2.0 mg/dL - - - - -  Micro/Creat Ratio 0.0 - 30.0 mg/g - - - - -  Chol <200 mg/dL 146 - - 158 -  HDL >50 mg/dL 71 - - 54 -  Calc LDL <100 mg/dL 61 - - 84 -  Triglycerides <150 mg/dL 72 - - 102 -  Creatinine 0.50 - 1.05 mg/dL 1.07(H) 0.90 1.00 1.21(H) 0.97   BP/Weight 02/23/2017 11/11/2016 11/04/2016 07/23/2016 02/17/2016 09/19/2015 7/91/5041  Systolic BP 364 383 779 396 886 484 720  Diastolic BP 90 74 76 88 82 88 84  Wt. (Lbs) 145 - 137.75 123 128.04 127 133.12  BMI 23.4 - 22.23 19.85 20.67 20.51 21.5   Foot/eye exam completion dates 07/23/2016 09/19/2015  Foot Form Completion Done Done

## 2017-02-28 NOTE — Assessment & Plan Note (Addendum)
Needs therapy but no interest in following through. Ongoing polysubstance abuse , unfortunately. Not suicidal or homicidal Continue Remeron as before

## 2017-02-28 NOTE — Assessment & Plan Note (Signed)
Ongoing challenge , 7 minutes spent counseling patient in an attempt to engage her in taking control of her health

## 2017-04-19 ENCOUNTER — Other Ambulatory Visit: Payer: Self-pay | Admitting: Family Medicine

## 2017-04-20 ENCOUNTER — Other Ambulatory Visit: Payer: Self-pay | Admitting: Family Medicine

## 2017-05-26 ENCOUNTER — Ambulatory Visit: Payer: Medicaid Other | Admitting: Family Medicine

## 2017-06-08 ENCOUNTER — Other Ambulatory Visit: Payer: Self-pay | Admitting: Family Medicine

## 2017-08-31 ENCOUNTER — Other Ambulatory Visit: Payer: Self-pay | Admitting: Family Medicine

## 2017-09-20 DIAGNOSIS — I10 Essential (primary) hypertension: Secondary | ICD-10-CM | POA: Diagnosis not present

## 2017-09-27 DIAGNOSIS — I10 Essential (primary) hypertension: Secondary | ICD-10-CM | POA: Diagnosis not present

## 2017-10-04 DIAGNOSIS — I10 Essential (primary) hypertension: Secondary | ICD-10-CM | POA: Diagnosis not present

## 2017-10-11 DIAGNOSIS — I10 Essential (primary) hypertension: Secondary | ICD-10-CM | POA: Diagnosis not present

## 2017-10-18 DIAGNOSIS — I10 Essential (primary) hypertension: Secondary | ICD-10-CM | POA: Diagnosis not present

## 2017-10-21 DIAGNOSIS — I10 Essential (primary) hypertension: Secondary | ICD-10-CM | POA: Diagnosis not present

## 2017-10-25 DIAGNOSIS — I10 Essential (primary) hypertension: Secondary | ICD-10-CM | POA: Diagnosis not present

## 2017-11-08 DIAGNOSIS — I10 Essential (primary) hypertension: Secondary | ICD-10-CM | POA: Diagnosis not present

## 2017-11-15 DIAGNOSIS — I10 Essential (primary) hypertension: Secondary | ICD-10-CM | POA: Diagnosis not present

## 2017-11-21 DIAGNOSIS — I10 Essential (primary) hypertension: Secondary | ICD-10-CM | POA: Diagnosis not present

## 2017-11-22 DIAGNOSIS — I10 Essential (primary) hypertension: Secondary | ICD-10-CM | POA: Diagnosis not present

## 2017-11-23 DIAGNOSIS — F331 Major depressive disorder, recurrent, moderate: Secondary | ICD-10-CM | POA: Diagnosis not present

## 2017-11-29 DIAGNOSIS — I10 Essential (primary) hypertension: Secondary | ICD-10-CM | POA: Diagnosis not present

## 2017-12-06 DIAGNOSIS — I10 Essential (primary) hypertension: Secondary | ICD-10-CM | POA: Diagnosis not present

## 2017-12-13 DIAGNOSIS — I10 Essential (primary) hypertension: Secondary | ICD-10-CM | POA: Diagnosis not present

## 2017-12-14 DIAGNOSIS — F331 Major depressive disorder, recurrent, moderate: Secondary | ICD-10-CM | POA: Diagnosis not present

## 2017-12-16 DIAGNOSIS — F331 Major depressive disorder, recurrent, moderate: Secondary | ICD-10-CM | POA: Diagnosis not present

## 2017-12-20 DIAGNOSIS — I10 Essential (primary) hypertension: Secondary | ICD-10-CM | POA: Diagnosis not present

## 2017-12-21 DIAGNOSIS — I10 Essential (primary) hypertension: Secondary | ICD-10-CM | POA: Diagnosis not present

## 2017-12-21 DIAGNOSIS — F331 Major depressive disorder, recurrent, moderate: Secondary | ICD-10-CM | POA: Diagnosis not present

## 2017-12-23 DIAGNOSIS — F331 Major depressive disorder, recurrent, moderate: Secondary | ICD-10-CM | POA: Diagnosis not present

## 2017-12-26 DIAGNOSIS — I10 Essential (primary) hypertension: Secondary | ICD-10-CM | POA: Diagnosis not present

## 2017-12-27 DIAGNOSIS — I10 Essential (primary) hypertension: Secondary | ICD-10-CM | POA: Diagnosis not present

## 2017-12-28 DIAGNOSIS — F331 Major depressive disorder, recurrent, moderate: Secondary | ICD-10-CM | POA: Diagnosis not present

## 2017-12-28 DIAGNOSIS — I10 Essential (primary) hypertension: Secondary | ICD-10-CM | POA: Diagnosis not present

## 2017-12-29 DIAGNOSIS — I10 Essential (primary) hypertension: Secondary | ICD-10-CM | POA: Diagnosis not present

## 2017-12-30 DIAGNOSIS — I10 Essential (primary) hypertension: Secondary | ICD-10-CM | POA: Diagnosis not present

## 2017-12-30 DIAGNOSIS — F331 Major depressive disorder, recurrent, moderate: Secondary | ICD-10-CM | POA: Diagnosis not present

## 2017-12-31 DIAGNOSIS — I10 Essential (primary) hypertension: Secondary | ICD-10-CM | POA: Diagnosis not present

## 2018-01-01 DIAGNOSIS — I10 Essential (primary) hypertension: Secondary | ICD-10-CM | POA: Diagnosis not present

## 2018-01-02 DIAGNOSIS — I10 Essential (primary) hypertension: Secondary | ICD-10-CM | POA: Diagnosis not present

## 2018-01-03 DIAGNOSIS — I10 Essential (primary) hypertension: Secondary | ICD-10-CM | POA: Diagnosis not present

## 2018-01-04 DIAGNOSIS — F331 Major depressive disorder, recurrent, moderate: Secondary | ICD-10-CM | POA: Diagnosis not present

## 2018-01-04 DIAGNOSIS — I10 Essential (primary) hypertension: Secondary | ICD-10-CM | POA: Diagnosis not present

## 2018-01-05 DIAGNOSIS — I10 Essential (primary) hypertension: Secondary | ICD-10-CM | POA: Diagnosis not present

## 2018-01-06 DIAGNOSIS — F331 Major depressive disorder, recurrent, moderate: Secondary | ICD-10-CM | POA: Diagnosis not present

## 2018-01-06 DIAGNOSIS — I10 Essential (primary) hypertension: Secondary | ICD-10-CM | POA: Diagnosis not present

## 2018-01-07 DIAGNOSIS — I10 Essential (primary) hypertension: Secondary | ICD-10-CM | POA: Diagnosis not present

## 2018-01-08 DIAGNOSIS — I10 Essential (primary) hypertension: Secondary | ICD-10-CM | POA: Diagnosis not present

## 2018-01-09 DIAGNOSIS — I10 Essential (primary) hypertension: Secondary | ICD-10-CM | POA: Diagnosis not present

## 2018-01-10 DIAGNOSIS — I10 Essential (primary) hypertension: Secondary | ICD-10-CM | POA: Diagnosis not present

## 2018-01-11 DIAGNOSIS — I10 Essential (primary) hypertension: Secondary | ICD-10-CM | POA: Diagnosis not present

## 2018-01-11 DIAGNOSIS — F331 Major depressive disorder, recurrent, moderate: Secondary | ICD-10-CM | POA: Diagnosis not present

## 2018-01-12 DIAGNOSIS — I10 Essential (primary) hypertension: Secondary | ICD-10-CM | POA: Diagnosis not present

## 2018-01-13 DIAGNOSIS — I10 Essential (primary) hypertension: Secondary | ICD-10-CM | POA: Diagnosis not present

## 2018-01-13 DIAGNOSIS — F331 Major depressive disorder, recurrent, moderate: Secondary | ICD-10-CM | POA: Diagnosis not present

## 2018-01-14 DIAGNOSIS — I10 Essential (primary) hypertension: Secondary | ICD-10-CM | POA: Diagnosis not present

## 2018-01-15 DIAGNOSIS — I10 Essential (primary) hypertension: Secondary | ICD-10-CM | POA: Diagnosis not present

## 2018-01-16 DIAGNOSIS — I10 Essential (primary) hypertension: Secondary | ICD-10-CM | POA: Diagnosis not present

## 2018-01-17 DIAGNOSIS — I10 Essential (primary) hypertension: Secondary | ICD-10-CM | POA: Diagnosis not present

## 2018-01-18 DIAGNOSIS — F331 Major depressive disorder, recurrent, moderate: Secondary | ICD-10-CM | POA: Diagnosis not present

## 2018-01-18 DIAGNOSIS — I10 Essential (primary) hypertension: Secondary | ICD-10-CM | POA: Diagnosis not present

## 2018-01-19 DIAGNOSIS — I10 Essential (primary) hypertension: Secondary | ICD-10-CM | POA: Diagnosis not present

## 2018-01-20 DIAGNOSIS — F331 Major depressive disorder, recurrent, moderate: Secondary | ICD-10-CM | POA: Diagnosis not present

## 2018-01-20 DIAGNOSIS — I10 Essential (primary) hypertension: Secondary | ICD-10-CM | POA: Diagnosis not present

## 2018-01-21 DIAGNOSIS — I10 Essential (primary) hypertension: Secondary | ICD-10-CM | POA: Diagnosis not present

## 2018-01-22 DIAGNOSIS — I10 Essential (primary) hypertension: Secondary | ICD-10-CM | POA: Diagnosis not present

## 2018-01-23 DIAGNOSIS — I10 Essential (primary) hypertension: Secondary | ICD-10-CM | POA: Diagnosis not present

## 2018-01-24 ENCOUNTER — Encounter: Payer: Self-pay | Admitting: Family Medicine

## 2018-01-24 ENCOUNTER — Ambulatory Visit (INDEPENDENT_AMBULATORY_CARE_PROVIDER_SITE_OTHER): Payer: Medicaid Other | Admitting: Family Medicine

## 2018-01-24 VITALS — BP 124/74 | HR 91 | Resp 12 | Ht 67.0 in | Wt 142.0 lb

## 2018-01-24 DIAGNOSIS — F329 Major depressive disorder, single episode, unspecified: Secondary | ICD-10-CM | POA: Diagnosis not present

## 2018-01-24 DIAGNOSIS — E1169 Type 2 diabetes mellitus with other specified complication: Secondary | ICD-10-CM

## 2018-01-24 DIAGNOSIS — Z91199 Patient's noncompliance with other medical treatment and regimen due to unspecified reason: Secondary | ICD-10-CM

## 2018-01-24 DIAGNOSIS — Z13220 Encounter for screening for lipoid disorders: Secondary | ICD-10-CM | POA: Diagnosis not present

## 2018-01-24 DIAGNOSIS — Z9119 Patient's noncompliance with other medical treatment and regimen: Secondary | ICD-10-CM | POA: Diagnosis not present

## 2018-01-24 DIAGNOSIS — F419 Anxiety disorder, unspecified: Secondary | ICD-10-CM

## 2018-01-24 DIAGNOSIS — I10 Essential (primary) hypertension: Secondary | ICD-10-CM

## 2018-01-24 MED ORDER — MIRTAZAPINE 7.5 MG PO TABS: 7.5000 mg | ORAL_TABLET | Freq: Every day | ORAL | 3 refills | Status: DC

## 2018-01-24 MED ORDER — AMLODIPINE BESYLATE 5 MG PO TABS: 5.0000 mg | ORAL_TABLET | Freq: Every day | ORAL | 5 refills | Status: DC

## 2018-01-24 MED ORDER — MIRTAZAPINE 7.5 MG PO TABS: 7.5000 mg | ORAL_TABLET | Freq: Every day | ORAL | 5 refills | Status: DC

## 2018-01-24 NOTE — Patient Instructions (Signed)
Annual physical exam in 4  Months, call if you need me sooner  Please schedule mammogram for December, / when due , states they want the test  Need flu vaccine, please come back for thast  Fasting cbc, lipid, cmp and eGFR, hBa1c, tSH this week  You will get supplement if your blood work shows that you need this 

## 2018-01-25 DIAGNOSIS — F331 Major depressive disorder, recurrent, moderate: Secondary | ICD-10-CM | POA: Diagnosis not present

## 2018-01-25 DIAGNOSIS — I10 Essential (primary) hypertension: Secondary | ICD-10-CM | POA: Diagnosis not present

## 2018-01-26 DIAGNOSIS — I10 Essential (primary) hypertension: Secondary | ICD-10-CM | POA: Diagnosis not present

## 2018-01-27 ENCOUNTER — Ambulatory Visit: Payer: Medicaid Other | Admitting: Family Medicine

## 2018-01-27 DIAGNOSIS — F331 Major depressive disorder, recurrent, moderate: Secondary | ICD-10-CM | POA: Diagnosis not present

## 2018-01-27 DIAGNOSIS — I10 Essential (primary) hypertension: Secondary | ICD-10-CM | POA: Diagnosis not present

## 2018-01-28 DIAGNOSIS — I10 Essential (primary) hypertension: Secondary | ICD-10-CM | POA: Diagnosis not present

## 2018-01-29 DIAGNOSIS — I10 Essential (primary) hypertension: Secondary | ICD-10-CM | POA: Diagnosis not present

## 2018-01-30 ENCOUNTER — Encounter: Payer: Self-pay | Admitting: Family Medicine

## 2018-01-30 DIAGNOSIS — I10 Essential (primary) hypertension: Secondary | ICD-10-CM | POA: Diagnosis not present

## 2018-01-30 NOTE — Assessment & Plan Note (Signed)
Updated lab needed and is past due

## 2018-01-30 NOTE — Assessment & Plan Note (Signed)
Resume Reneuron at bedtiime, not suicidal or homicidal, not tearful or anxious at visit

## 2018-01-30 NOTE — Assessment & Plan Note (Signed)
Controlled, no change in medication  

## 2018-01-30 NOTE — Progress Notes (Signed)
   Latoya Walsh     MRN: [id number]      DOB: 07-21-1960   HPI [name] is here for follow up and re-evaluation of chronic medical conditions, medication management and review of any available recent lab and radiology data.  Preventive health is updated, specifically  Cancer screening and Immunization.  Refuses flu vaccine at this visit. Has no medication and unable to provide a reliable history   There are no specific complaints   ROS Denies recent fever or chills. Denies sinus pressure, nasal congestion, ear pain or sore throat. Denies chest congestion, productive cough or wheezing. Denies chest pains, palpitations and leg swelling Denies abdominal pain, nausea, vomiting,diarrhea or constipation.   Denies dysuria, frequency, hesitancy or incontinence. Denies joint pain, swelling and limitation in mobility. Denies headaches, seizures, numbness, or tingling. Chronic  depression, anxiety and  insomnia. Denies skin break down or rash.   PE  BP 124/74 (BP Location: Left Arm, Patient Position: Sitting, Cuff Size: Normal)   Pulse 91   Resp 12   Ht 5\' 7"  (1.702 m)   Wt 142 lb 0.6 oz (64.4 kg)   LMP 06/23/2010   SpO2 99% Comment: room air  BMI 22.25 kg/m   Patient alert and oriented and in no cardiopulmonary distress.  HEENT: No facial asymmetry, EOMI,   oropharynx pink and moist.  Neck supple no JVD, no mass.  Chest: Clear to auscultation bilaterally.  CVS: S1, S2 no murmurs, no S3.Regular rate.  ABD: Soft non tender.   Ext: No edema  MS: Adequate ROM spine, shoulders, hips and knees.    CNS: CN 2-12 intact, power,  normal throughout.no focal deficits noted.   Assessment & Plan  HTN, goal below 130/80 Controlled, no change in medication   Type 2 diabetes mellitus ([place]) Updated lab needed and is past due  Anxiety and depression Resume Reneuron at bedtiime, not suicidal or homicidal, not tearful or anxious at visit  Non compliance with medical  treatment Ongoing challenge she is encouraged and re educated re the need to change the behavior

## 2018-01-30 NOTE — Assessment & Plan Note (Signed)
Ongoing challenge she is encouraged and re educated re the need to change the behavior

## 2018-01-31 DIAGNOSIS — I10 Essential (primary) hypertension: Secondary | ICD-10-CM | POA: Diagnosis not present

## 2018-02-01 DIAGNOSIS — F331 Major depressive disorder, recurrent, moderate: Secondary | ICD-10-CM | POA: Diagnosis not present

## 2018-02-01 DIAGNOSIS — I10 Essential (primary) hypertension: Secondary | ICD-10-CM | POA: Diagnosis not present

## 2018-02-02 DIAGNOSIS — I10 Essential (primary) hypertension: Secondary | ICD-10-CM | POA: Diagnosis not present

## 2018-02-03 DIAGNOSIS — I10 Essential (primary) hypertension: Secondary | ICD-10-CM | POA: Diagnosis not present

## 2018-02-04 DIAGNOSIS — I10 Essential (primary) hypertension: Secondary | ICD-10-CM | POA: Diagnosis not present

## 2018-02-05 DIAGNOSIS — I10 Essential (primary) hypertension: Secondary | ICD-10-CM | POA: Diagnosis not present

## 2018-02-06 DIAGNOSIS — I10 Essential (primary) hypertension: Secondary | ICD-10-CM | POA: Diagnosis not present

## 2018-02-07 DIAGNOSIS — I10 Essential (primary) hypertension: Secondary | ICD-10-CM | POA: Diagnosis not present

## 2018-02-08 DIAGNOSIS — I10 Essential (primary) hypertension: Secondary | ICD-10-CM | POA: Diagnosis not present

## 2018-02-09 DIAGNOSIS — I10 Essential (primary) hypertension: Secondary | ICD-10-CM | POA: Diagnosis not present

## 2018-02-09 DIAGNOSIS — F331 Major depressive disorder, recurrent, moderate: Secondary | ICD-10-CM | POA: Diagnosis not present

## 2018-02-10 DIAGNOSIS — I10 Essential (primary) hypertension: Secondary | ICD-10-CM | POA: Diagnosis not present

## 2018-02-11 DIAGNOSIS — I10 Essential (primary) hypertension: Secondary | ICD-10-CM | POA: Diagnosis not present

## 2018-02-12 DIAGNOSIS — I10 Essential (primary) hypertension: Secondary | ICD-10-CM | POA: Diagnosis not present

## 2018-02-13 DIAGNOSIS — I10 Essential (primary) hypertension: Secondary | ICD-10-CM | POA: Diagnosis not present

## 2018-02-14 DIAGNOSIS — I10 Essential (primary) hypertension: Secondary | ICD-10-CM | POA: Diagnosis not present

## 2018-02-15 DIAGNOSIS — I10 Essential (primary) hypertension: Secondary | ICD-10-CM | POA: Diagnosis not present

## 2018-02-16 DIAGNOSIS — F331 Major depressive disorder, recurrent, moderate: Secondary | ICD-10-CM | POA: Diagnosis not present

## 2018-02-16 DIAGNOSIS — I10 Essential (primary) hypertension: Secondary | ICD-10-CM | POA: Diagnosis not present

## 2018-02-17 DIAGNOSIS — I10 Essential (primary) hypertension: Secondary | ICD-10-CM | POA: Diagnosis not present

## 2018-02-18 DIAGNOSIS — I10 Essential (primary) hypertension: Secondary | ICD-10-CM | POA: Diagnosis not present

## 2018-02-19 DIAGNOSIS — F331 Major depressive disorder, recurrent, moderate: Secondary | ICD-10-CM | POA: Diagnosis not present

## 2018-02-19 DIAGNOSIS — I10 Essential (primary) hypertension: Secondary | ICD-10-CM | POA: Diagnosis not present

## 2018-02-20 DIAGNOSIS — I10 Essential (primary) hypertension: Secondary | ICD-10-CM | POA: Diagnosis not present

## 2018-02-21 DIAGNOSIS — I10 Essential (primary) hypertension: Secondary | ICD-10-CM | POA: Diagnosis not present

## 2018-02-22 DIAGNOSIS — F331 Major depressive disorder, recurrent, moderate: Secondary | ICD-10-CM | POA: Diagnosis not present

## 2018-02-22 DIAGNOSIS — I10 Essential (primary) hypertension: Secondary | ICD-10-CM | POA: Diagnosis not present

## 2018-02-23 DIAGNOSIS — I10 Essential (primary) hypertension: Secondary | ICD-10-CM | POA: Diagnosis not present

## 2018-02-24 DIAGNOSIS — F331 Major depressive disorder, recurrent, moderate: Secondary | ICD-10-CM | POA: Diagnosis not present

## 2018-02-24 DIAGNOSIS — I10 Essential (primary) hypertension: Secondary | ICD-10-CM | POA: Diagnosis not present

## 2018-02-25 DIAGNOSIS — I10 Essential (primary) hypertension: Secondary | ICD-10-CM | POA: Diagnosis not present

## 2018-02-26 DIAGNOSIS — I10 Essential (primary) hypertension: Secondary | ICD-10-CM | POA: Diagnosis not present

## 2018-02-27 DIAGNOSIS — I10 Essential (primary) hypertension: Secondary | ICD-10-CM | POA: Diagnosis not present

## 2018-02-28 DIAGNOSIS — I10 Essential (primary) hypertension: Secondary | ICD-10-CM | POA: Diagnosis not present

## 2018-03-01 DIAGNOSIS — F331 Major depressive disorder, recurrent, moderate: Secondary | ICD-10-CM | POA: Diagnosis not present

## 2018-03-01 DIAGNOSIS — I10 Essential (primary) hypertension: Secondary | ICD-10-CM | POA: Diagnosis not present

## 2018-03-02 DIAGNOSIS — I10 Essential (primary) hypertension: Secondary | ICD-10-CM | POA: Diagnosis not present

## 2018-03-03 DIAGNOSIS — I10 Essential (primary) hypertension: Secondary | ICD-10-CM | POA: Diagnosis not present

## 2018-03-04 DIAGNOSIS — I10 Essential (primary) hypertension: Secondary | ICD-10-CM | POA: Diagnosis not present

## 2018-03-05 DIAGNOSIS — I10 Essential (primary) hypertension: Secondary | ICD-10-CM | POA: Diagnosis not present

## 2018-03-06 DIAGNOSIS — I10 Essential (primary) hypertension: Secondary | ICD-10-CM | POA: Diagnosis not present

## 2018-03-07 DIAGNOSIS — I10 Essential (primary) hypertension: Secondary | ICD-10-CM | POA: Diagnosis not present

## 2018-03-08 DIAGNOSIS — I10 Essential (primary) hypertension: Secondary | ICD-10-CM | POA: Diagnosis not present

## 2018-03-09 DIAGNOSIS — I10 Essential (primary) hypertension: Secondary | ICD-10-CM | POA: Diagnosis not present

## 2018-03-10 DIAGNOSIS — I10 Essential (primary) hypertension: Secondary | ICD-10-CM | POA: Diagnosis not present

## 2018-03-11 DIAGNOSIS — I10 Essential (primary) hypertension: Secondary | ICD-10-CM | POA: Diagnosis not present

## 2018-03-12 DIAGNOSIS — I10 Essential (primary) hypertension: Secondary | ICD-10-CM | POA: Diagnosis not present

## 2018-03-13 DIAGNOSIS — I10 Essential (primary) hypertension: Secondary | ICD-10-CM | POA: Diagnosis not present

## 2018-03-14 DIAGNOSIS — I10 Essential (primary) hypertension: Secondary | ICD-10-CM | POA: Diagnosis not present

## 2018-03-15 DIAGNOSIS — I10 Essential (primary) hypertension: Secondary | ICD-10-CM | POA: Diagnosis not present

## 2018-03-16 DIAGNOSIS — I10 Essential (primary) hypertension: Secondary | ICD-10-CM | POA: Diagnosis not present

## 2018-03-17 DIAGNOSIS — I10 Essential (primary) hypertension: Secondary | ICD-10-CM | POA: Diagnosis not present

## 2018-03-18 DIAGNOSIS — I10 Essential (primary) hypertension: Secondary | ICD-10-CM | POA: Diagnosis not present

## 2018-03-19 DIAGNOSIS — I10 Essential (primary) hypertension: Secondary | ICD-10-CM | POA: Diagnosis not present

## 2018-03-20 DIAGNOSIS — I10 Essential (primary) hypertension: Secondary | ICD-10-CM | POA: Diagnosis not present

## 2018-03-21 DIAGNOSIS — I10 Essential (primary) hypertension: Secondary | ICD-10-CM | POA: Diagnosis not present

## 2018-03-22 DIAGNOSIS — I10 Essential (primary) hypertension: Secondary | ICD-10-CM | POA: Diagnosis not present

## 2018-03-23 DIAGNOSIS — I10 Essential (primary) hypertension: Secondary | ICD-10-CM | POA: Diagnosis not present

## 2018-03-24 DIAGNOSIS — I10 Essential (primary) hypertension: Secondary | ICD-10-CM | POA: Diagnosis not present

## 2018-03-25 DIAGNOSIS — I10 Essential (primary) hypertension: Secondary | ICD-10-CM | POA: Diagnosis not present

## 2018-03-26 DIAGNOSIS — I10 Essential (primary) hypertension: Secondary | ICD-10-CM | POA: Diagnosis not present

## 2018-03-27 DIAGNOSIS — I10 Essential (primary) hypertension: Secondary | ICD-10-CM | POA: Diagnosis not present

## 2018-03-28 DIAGNOSIS — I10 Essential (primary) hypertension: Secondary | ICD-10-CM | POA: Diagnosis not present

## 2018-03-29 DIAGNOSIS — I10 Essential (primary) hypertension: Secondary | ICD-10-CM | POA: Diagnosis not present

## 2018-03-30 DIAGNOSIS — I10 Essential (primary) hypertension: Secondary | ICD-10-CM | POA: Diagnosis not present

## 2018-03-31 DIAGNOSIS — I10 Essential (primary) hypertension: Secondary | ICD-10-CM | POA: Diagnosis not present

## 2018-04-01 DIAGNOSIS — I10 Essential (primary) hypertension: Secondary | ICD-10-CM | POA: Diagnosis not present

## 2018-04-02 DIAGNOSIS — I10 Essential (primary) hypertension: Secondary | ICD-10-CM | POA: Diagnosis not present

## 2018-04-03 DIAGNOSIS — I10 Essential (primary) hypertension: Secondary | ICD-10-CM | POA: Diagnosis not present

## 2018-04-04 DIAGNOSIS — I10 Essential (primary) hypertension: Secondary | ICD-10-CM | POA: Diagnosis not present

## 2018-04-05 DIAGNOSIS — I10 Essential (primary) hypertension: Secondary | ICD-10-CM | POA: Diagnosis not present

## 2018-04-06 DIAGNOSIS — I10 Essential (primary) hypertension: Secondary | ICD-10-CM | POA: Diagnosis not present

## 2018-04-07 DIAGNOSIS — I10 Essential (primary) hypertension: Secondary | ICD-10-CM | POA: Diagnosis not present

## 2018-04-08 DIAGNOSIS — I10 Essential (primary) hypertension: Secondary | ICD-10-CM | POA: Diagnosis not present

## 2018-04-09 DIAGNOSIS — I10 Essential (primary) hypertension: Secondary | ICD-10-CM | POA: Diagnosis not present

## 2018-04-10 DIAGNOSIS — I10 Essential (primary) hypertension: Secondary | ICD-10-CM | POA: Diagnosis not present

## 2018-04-11 DIAGNOSIS — I10 Essential (primary) hypertension: Secondary | ICD-10-CM | POA: Diagnosis not present

## 2018-04-12 DIAGNOSIS — I10 Essential (primary) hypertension: Secondary | ICD-10-CM | POA: Diagnosis not present

## 2018-04-13 DIAGNOSIS — I10 Essential (primary) hypertension: Secondary | ICD-10-CM | POA: Diagnosis not present

## 2018-04-16 DIAGNOSIS — I10 Essential (primary) hypertension: Secondary | ICD-10-CM | POA: Diagnosis not present

## 2018-04-17 DIAGNOSIS — I10 Essential (primary) hypertension: Secondary | ICD-10-CM | POA: Diagnosis not present

## 2018-04-18 DIAGNOSIS — I10 Essential (primary) hypertension: Secondary | ICD-10-CM | POA: Diagnosis not present

## 2018-04-19 DIAGNOSIS — I10 Essential (primary) hypertension: Secondary | ICD-10-CM | POA: Diagnosis not present

## 2018-04-20 DIAGNOSIS — I10 Essential (primary) hypertension: Secondary | ICD-10-CM | POA: Diagnosis not present

## 2018-04-21 DIAGNOSIS — I10 Essential (primary) hypertension: Secondary | ICD-10-CM | POA: Diagnosis not present

## 2018-04-22 DIAGNOSIS — I10 Essential (primary) hypertension: Secondary | ICD-10-CM | POA: Diagnosis not present

## 2018-04-23 DIAGNOSIS — I10 Essential (primary) hypertension: Secondary | ICD-10-CM | POA: Diagnosis not present

## 2018-04-24 DIAGNOSIS — I10 Essential (primary) hypertension: Secondary | ICD-10-CM | POA: Diagnosis not present

## 2018-04-25 DIAGNOSIS — I10 Essential (primary) hypertension: Secondary | ICD-10-CM | POA: Diagnosis not present

## 2018-04-26 DIAGNOSIS — I10 Essential (primary) hypertension: Secondary | ICD-10-CM | POA: Diagnosis not present

## 2018-04-27 DIAGNOSIS — I10 Essential (primary) hypertension: Secondary | ICD-10-CM | POA: Diagnosis not present

## 2018-04-28 DIAGNOSIS — I10 Essential (primary) hypertension: Secondary | ICD-10-CM | POA: Diagnosis not present

## 2018-04-29 DIAGNOSIS — I10 Essential (primary) hypertension: Secondary | ICD-10-CM | POA: Diagnosis not present

## 2018-04-30 DIAGNOSIS — I10 Essential (primary) hypertension: Secondary | ICD-10-CM | POA: Diagnosis not present

## 2018-05-01 DIAGNOSIS — I10 Essential (primary) hypertension: Secondary | ICD-10-CM | POA: Diagnosis not present

## 2018-05-02 DIAGNOSIS — I10 Essential (primary) hypertension: Secondary | ICD-10-CM | POA: Diagnosis not present

## 2018-05-03 DIAGNOSIS — I10 Essential (primary) hypertension: Secondary | ICD-10-CM | POA: Diagnosis not present

## 2018-05-04 DIAGNOSIS — I10 Essential (primary) hypertension: Secondary | ICD-10-CM | POA: Diagnosis not present

## 2018-05-05 DIAGNOSIS — I10 Essential (primary) hypertension: Secondary | ICD-10-CM | POA: Diagnosis not present

## 2018-05-06 DIAGNOSIS — I10 Essential (primary) hypertension: Secondary | ICD-10-CM | POA: Diagnosis not present

## 2018-05-07 DIAGNOSIS — I10 Essential (primary) hypertension: Secondary | ICD-10-CM | POA: Diagnosis not present

## 2018-05-08 DIAGNOSIS — I10 Essential (primary) hypertension: Secondary | ICD-10-CM | POA: Diagnosis not present

## 2018-05-09 DIAGNOSIS — I10 Essential (primary) hypertension: Secondary | ICD-10-CM | POA: Diagnosis not present

## 2018-05-10 DIAGNOSIS — I10 Essential (primary) hypertension: Secondary | ICD-10-CM | POA: Diagnosis not present

## 2018-05-11 DIAGNOSIS — I10 Essential (primary) hypertension: Secondary | ICD-10-CM | POA: Diagnosis not present

## 2018-05-12 DIAGNOSIS — I10 Essential (primary) hypertension: Secondary | ICD-10-CM | POA: Diagnosis not present

## 2018-05-13 DIAGNOSIS — I10 Essential (primary) hypertension: Secondary | ICD-10-CM | POA: Diagnosis not present

## 2018-05-14 DIAGNOSIS — I10 Essential (primary) hypertension: Secondary | ICD-10-CM | POA: Diagnosis not present

## 2018-05-15 DIAGNOSIS — I10 Essential (primary) hypertension: Secondary | ICD-10-CM | POA: Diagnosis not present

## 2018-05-16 DIAGNOSIS — I10 Essential (primary) hypertension: Secondary | ICD-10-CM | POA: Diagnosis not present

## 2018-05-17 DIAGNOSIS — I10 Essential (primary) hypertension: Secondary | ICD-10-CM | POA: Diagnosis not present

## 2018-05-18 DIAGNOSIS — I10 Essential (primary) hypertension: Secondary | ICD-10-CM | POA: Diagnosis not present

## 2018-05-19 DIAGNOSIS — I10 Essential (primary) hypertension: Secondary | ICD-10-CM | POA: Diagnosis not present

## 2018-05-20 DIAGNOSIS — I10 Essential (primary) hypertension: Secondary | ICD-10-CM | POA: Diagnosis not present

## 2018-05-21 DIAGNOSIS — I10 Essential (primary) hypertension: Secondary | ICD-10-CM | POA: Diagnosis not present

## 2018-05-22 DIAGNOSIS — I10 Essential (primary) hypertension: Secondary | ICD-10-CM | POA: Diagnosis not present

## 2018-05-23 DIAGNOSIS — I10 Essential (primary) hypertension: Secondary | ICD-10-CM | POA: Diagnosis not present

## 2018-05-24 DIAGNOSIS — I10 Essential (primary) hypertension: Secondary | ICD-10-CM | POA: Diagnosis not present

## 2018-05-25 DIAGNOSIS — I10 Essential (primary) hypertension: Secondary | ICD-10-CM | POA: Diagnosis not present

## 2018-05-26 DIAGNOSIS — I10 Essential (primary) hypertension: Secondary | ICD-10-CM | POA: Diagnosis not present

## 2018-05-27 DIAGNOSIS — I10 Essential (primary) hypertension: Secondary | ICD-10-CM | POA: Diagnosis not present

## 2018-05-28 DIAGNOSIS — I10 Essential (primary) hypertension: Secondary | ICD-10-CM | POA: Diagnosis not present

## 2018-05-29 DIAGNOSIS — I10 Essential (primary) hypertension: Secondary | ICD-10-CM | POA: Diagnosis not present

## 2018-05-30 DIAGNOSIS — I10 Essential (primary) hypertension: Secondary | ICD-10-CM | POA: Diagnosis not present

## 2018-05-31 DIAGNOSIS — I10 Essential (primary) hypertension: Secondary | ICD-10-CM | POA: Diagnosis not present

## 2018-06-01 DIAGNOSIS — I10 Essential (primary) hypertension: Secondary | ICD-10-CM | POA: Diagnosis not present

## 2018-06-02 DIAGNOSIS — I10 Essential (primary) hypertension: Secondary | ICD-10-CM | POA: Diagnosis not present

## 2018-06-03 DIAGNOSIS — I10 Essential (primary) hypertension: Secondary | ICD-10-CM | POA: Diagnosis not present

## 2018-06-04 DIAGNOSIS — I10 Essential (primary) hypertension: Secondary | ICD-10-CM | POA: Diagnosis not present

## 2018-06-05 DIAGNOSIS — I10 Essential (primary) hypertension: Secondary | ICD-10-CM | POA: Diagnosis not present

## 2018-06-06 DIAGNOSIS — I10 Essential (primary) hypertension: Secondary | ICD-10-CM | POA: Diagnosis not present

## 2018-06-07 DIAGNOSIS — I10 Essential (primary) hypertension: Secondary | ICD-10-CM | POA: Diagnosis not present

## 2018-06-08 DIAGNOSIS — I10 Essential (primary) hypertension: Secondary | ICD-10-CM | POA: Diagnosis not present

## 2018-06-09 DIAGNOSIS — I10 Essential (primary) hypertension: Secondary | ICD-10-CM | POA: Diagnosis not present

## 2018-06-10 DIAGNOSIS — I10 Essential (primary) hypertension: Secondary | ICD-10-CM | POA: Diagnosis not present

## 2018-06-11 DIAGNOSIS — I10 Essential (primary) hypertension: Secondary | ICD-10-CM | POA: Diagnosis not present

## 2018-06-12 DIAGNOSIS — I10 Essential (primary) hypertension: Secondary | ICD-10-CM | POA: Diagnosis not present

## 2018-06-13 DIAGNOSIS — I10 Essential (primary) hypertension: Secondary | ICD-10-CM | POA: Diagnosis not present

## 2018-06-14 DIAGNOSIS — I10 Essential (primary) hypertension: Secondary | ICD-10-CM | POA: Diagnosis not present

## 2018-06-15 DIAGNOSIS — I10 Essential (primary) hypertension: Secondary | ICD-10-CM | POA: Diagnosis not present

## 2018-06-16 DIAGNOSIS — I10 Essential (primary) hypertension: Secondary | ICD-10-CM | POA: Diagnosis not present

## 2018-06-17 DIAGNOSIS — I10 Essential (primary) hypertension: Secondary | ICD-10-CM | POA: Diagnosis not present

## 2018-06-18 DIAGNOSIS — I10 Essential (primary) hypertension: Secondary | ICD-10-CM | POA: Diagnosis not present

## 2018-06-19 DIAGNOSIS — I10 Essential (primary) hypertension: Secondary | ICD-10-CM | POA: Diagnosis not present

## 2018-06-20 DIAGNOSIS — I10 Essential (primary) hypertension: Secondary | ICD-10-CM | POA: Diagnosis not present

## 2018-06-21 DIAGNOSIS — I10 Essential (primary) hypertension: Secondary | ICD-10-CM | POA: Diagnosis not present

## 2018-06-22 DIAGNOSIS — I10 Essential (primary) hypertension: Secondary | ICD-10-CM | POA: Diagnosis not present

## 2018-06-23 DIAGNOSIS — I10 Essential (primary) hypertension: Secondary | ICD-10-CM | POA: Diagnosis not present

## 2018-06-24 DIAGNOSIS — I10 Essential (primary) hypertension: Secondary | ICD-10-CM | POA: Diagnosis not present

## 2018-06-25 DIAGNOSIS — I10 Essential (primary) hypertension: Secondary | ICD-10-CM | POA: Diagnosis not present

## 2018-06-26 DIAGNOSIS — I10 Essential (primary) hypertension: Secondary | ICD-10-CM | POA: Diagnosis not present

## 2018-06-27 DIAGNOSIS — I10 Essential (primary) hypertension: Secondary | ICD-10-CM | POA: Diagnosis not present

## 2018-06-28 DIAGNOSIS — I10 Essential (primary) hypertension: Secondary | ICD-10-CM | POA: Diagnosis not present

## 2018-06-29 DIAGNOSIS — I10 Essential (primary) hypertension: Secondary | ICD-10-CM | POA: Diagnosis not present

## 2018-06-30 DIAGNOSIS — I10 Essential (primary) hypertension: Secondary | ICD-10-CM | POA: Diagnosis not present

## 2018-07-01 DIAGNOSIS — I10 Essential (primary) hypertension: Secondary | ICD-10-CM | POA: Diagnosis not present

## 2018-07-02 DIAGNOSIS — I10 Essential (primary) hypertension: Secondary | ICD-10-CM | POA: Diagnosis not present

## 2018-07-03 DIAGNOSIS — I10 Essential (primary) hypertension: Secondary | ICD-10-CM | POA: Diagnosis not present

## 2018-07-04 DIAGNOSIS — I10 Essential (primary) hypertension: Secondary | ICD-10-CM | POA: Diagnosis not present

## 2018-07-05 DIAGNOSIS — I10 Essential (primary) hypertension: Secondary | ICD-10-CM | POA: Diagnosis not present

## 2018-07-06 ENCOUNTER — Encounter: Payer: Medicaid Other | Admitting: Family Medicine

## 2018-07-06 DIAGNOSIS — I10 Essential (primary) hypertension: Secondary | ICD-10-CM | POA: Diagnosis not present

## 2018-07-07 DIAGNOSIS — I10 Essential (primary) hypertension: Secondary | ICD-10-CM | POA: Diagnosis not present

## 2018-07-08 DIAGNOSIS — I10 Essential (primary) hypertension: Secondary | ICD-10-CM | POA: Diagnosis not present

## 2018-07-09 DIAGNOSIS — I10 Essential (primary) hypertension: Secondary | ICD-10-CM | POA: Diagnosis not present

## 2018-07-10 DIAGNOSIS — I10 Essential (primary) hypertension: Secondary | ICD-10-CM | POA: Diagnosis not present

## 2018-07-11 DIAGNOSIS — I10 Essential (primary) hypertension: Secondary | ICD-10-CM | POA: Diagnosis not present

## 2018-07-12 DIAGNOSIS — I10 Essential (primary) hypertension: Secondary | ICD-10-CM | POA: Diagnosis not present

## 2018-07-13 DIAGNOSIS — I10 Essential (primary) hypertension: Secondary | ICD-10-CM | POA: Diagnosis not present

## 2018-07-14 ENCOUNTER — Encounter: Payer: Medicaid Other | Admitting: Family Medicine

## 2018-07-14 DIAGNOSIS — I10 Essential (primary) hypertension: Secondary | ICD-10-CM | POA: Diagnosis not present

## 2018-07-15 DIAGNOSIS — I10 Essential (primary) hypertension: Secondary | ICD-10-CM | POA: Diagnosis not present

## 2018-07-16 DIAGNOSIS — I10 Essential (primary) hypertension: Secondary | ICD-10-CM | POA: Diagnosis not present

## 2018-07-17 DIAGNOSIS — I10 Essential (primary) hypertension: Secondary | ICD-10-CM | POA: Diagnosis not present

## 2018-07-18 DIAGNOSIS — I10 Essential (primary) hypertension: Secondary | ICD-10-CM | POA: Diagnosis not present

## 2018-07-19 DIAGNOSIS — I10 Essential (primary) hypertension: Secondary | ICD-10-CM | POA: Diagnosis not present

## 2018-07-20 DIAGNOSIS — I10 Essential (primary) hypertension: Secondary | ICD-10-CM | POA: Diagnosis not present

## 2018-07-21 DIAGNOSIS — I10 Essential (primary) hypertension: Secondary | ICD-10-CM | POA: Diagnosis not present

## 2018-07-22 DIAGNOSIS — I10 Essential (primary) hypertension: Secondary | ICD-10-CM | POA: Diagnosis not present

## 2018-07-23 DIAGNOSIS — I10 Essential (primary) hypertension: Secondary | ICD-10-CM | POA: Diagnosis not present

## 2018-07-24 DIAGNOSIS — I10 Essential (primary) hypertension: Secondary | ICD-10-CM | POA: Diagnosis not present

## 2018-07-25 DIAGNOSIS — I10 Essential (primary) hypertension: Secondary | ICD-10-CM | POA: Diagnosis not present

## 2018-07-26 DIAGNOSIS — I10 Essential (primary) hypertension: Secondary | ICD-10-CM | POA: Diagnosis not present

## 2018-07-27 DIAGNOSIS — I10 Essential (primary) hypertension: Secondary | ICD-10-CM | POA: Diagnosis not present

## 2018-07-28 DIAGNOSIS — I10 Essential (primary) hypertension: Secondary | ICD-10-CM | POA: Diagnosis not present

## 2018-07-29 DIAGNOSIS — I10 Essential (primary) hypertension: Secondary | ICD-10-CM | POA: Diagnosis not present

## 2018-07-30 DIAGNOSIS — I10 Essential (primary) hypertension: Secondary | ICD-10-CM | POA: Diagnosis not present

## 2018-07-31 DIAGNOSIS — I10 Essential (primary) hypertension: Secondary | ICD-10-CM | POA: Diagnosis not present

## 2018-08-01 DIAGNOSIS — I10 Essential (primary) hypertension: Secondary | ICD-10-CM | POA: Diagnosis not present

## 2018-08-02 DIAGNOSIS — I10 Essential (primary) hypertension: Secondary | ICD-10-CM | POA: Diagnosis not present

## 2018-08-03 DIAGNOSIS — I10 Essential (primary) hypertension: Secondary | ICD-10-CM | POA: Diagnosis not present

## 2018-08-04 DIAGNOSIS — I10 Essential (primary) hypertension: Secondary | ICD-10-CM | POA: Diagnosis not present

## 2018-08-05 DIAGNOSIS — I10 Essential (primary) hypertension: Secondary | ICD-10-CM | POA: Diagnosis not present

## 2018-08-06 DIAGNOSIS — I10 Essential (primary) hypertension: Secondary | ICD-10-CM | POA: Diagnosis not present

## 2018-08-07 DIAGNOSIS — I10 Essential (primary) hypertension: Secondary | ICD-10-CM | POA: Diagnosis not present

## 2018-08-08 DIAGNOSIS — I10 Essential (primary) hypertension: Secondary | ICD-10-CM | POA: Diagnosis not present

## 2018-08-09 DIAGNOSIS — I10 Essential (primary) hypertension: Secondary | ICD-10-CM | POA: Diagnosis not present

## 2018-08-10 DIAGNOSIS — I10 Essential (primary) hypertension: Secondary | ICD-10-CM | POA: Diagnosis not present

## 2018-08-11 DIAGNOSIS — I10 Essential (primary) hypertension: Secondary | ICD-10-CM | POA: Diagnosis not present

## 2018-08-12 DIAGNOSIS — I10 Essential (primary) hypertension: Secondary | ICD-10-CM | POA: Diagnosis not present

## 2018-08-13 DIAGNOSIS — I10 Essential (primary) hypertension: Secondary | ICD-10-CM | POA: Diagnosis not present

## 2018-08-14 DIAGNOSIS — I10 Essential (primary) hypertension: Secondary | ICD-10-CM | POA: Diagnosis not present

## 2018-08-15 DIAGNOSIS — I10 Essential (primary) hypertension: Secondary | ICD-10-CM | POA: Diagnosis not present

## 2018-08-16 DIAGNOSIS — I10 Essential (primary) hypertension: Secondary | ICD-10-CM | POA: Diagnosis not present

## 2018-08-17 DIAGNOSIS — I10 Essential (primary) hypertension: Secondary | ICD-10-CM | POA: Diagnosis not present

## 2018-08-18 DIAGNOSIS — I10 Essential (primary) hypertension: Secondary | ICD-10-CM | POA: Diagnosis not present

## 2018-08-19 DIAGNOSIS — I10 Essential (primary) hypertension: Secondary | ICD-10-CM | POA: Diagnosis not present

## 2018-08-22 DIAGNOSIS — I10 Essential (primary) hypertension: Secondary | ICD-10-CM | POA: Diagnosis not present

## 2018-08-23 DIAGNOSIS — I10 Essential (primary) hypertension: Secondary | ICD-10-CM | POA: Diagnosis not present

## 2018-08-24 DIAGNOSIS — I10 Essential (primary) hypertension: Secondary | ICD-10-CM | POA: Diagnosis not present

## 2018-08-25 DIAGNOSIS — I10 Essential (primary) hypertension: Secondary | ICD-10-CM | POA: Diagnosis not present

## 2018-08-26 DIAGNOSIS — I10 Essential (primary) hypertension: Secondary | ICD-10-CM | POA: Diagnosis not present

## 2018-08-29 DIAGNOSIS — I10 Essential (primary) hypertension: Secondary | ICD-10-CM | POA: Diagnosis not present

## 2018-08-30 DIAGNOSIS — I10 Essential (primary) hypertension: Secondary | ICD-10-CM | POA: Diagnosis not present

## 2018-08-31 DIAGNOSIS — I10 Essential (primary) hypertension: Secondary | ICD-10-CM | POA: Diagnosis not present

## 2018-09-01 DIAGNOSIS — I10 Essential (primary) hypertension: Secondary | ICD-10-CM | POA: Diagnosis not present

## 2018-09-02 DIAGNOSIS — I10 Essential (primary) hypertension: Secondary | ICD-10-CM | POA: Diagnosis not present

## 2018-09-05 DIAGNOSIS — I10 Essential (primary) hypertension: Secondary | ICD-10-CM | POA: Diagnosis not present

## 2018-09-06 DIAGNOSIS — I10 Essential (primary) hypertension: Secondary | ICD-10-CM | POA: Diagnosis not present

## 2018-09-07 DIAGNOSIS — I10 Essential (primary) hypertension: Secondary | ICD-10-CM | POA: Diagnosis not present

## 2018-09-08 DIAGNOSIS — I10 Essential (primary) hypertension: Secondary | ICD-10-CM | POA: Diagnosis not present

## 2018-09-09 DIAGNOSIS — I10 Essential (primary) hypertension: Secondary | ICD-10-CM | POA: Diagnosis not present

## 2018-09-12 DIAGNOSIS — I10 Essential (primary) hypertension: Secondary | ICD-10-CM | POA: Diagnosis not present

## 2018-09-13 DIAGNOSIS — I10 Essential (primary) hypertension: Secondary | ICD-10-CM | POA: Diagnosis not present

## 2018-09-14 DIAGNOSIS — I10 Essential (primary) hypertension: Secondary | ICD-10-CM | POA: Diagnosis not present

## 2018-09-15 ENCOUNTER — Encounter: Payer: Medicaid Other | Admitting: Family Medicine

## 2018-09-15 DIAGNOSIS — I10 Essential (primary) hypertension: Secondary | ICD-10-CM | POA: Diagnosis not present

## 2018-09-16 DIAGNOSIS — N390 Urinary tract infection, site not specified: Secondary | ICD-10-CM | POA: Diagnosis not present

## 2018-09-16 DIAGNOSIS — I10 Essential (primary) hypertension: Secondary | ICD-10-CM | POA: Diagnosis not present

## 2018-09-19 DIAGNOSIS — I10 Essential (primary) hypertension: Secondary | ICD-10-CM | POA: Diagnosis not present

## 2018-09-20 ENCOUNTER — Encounter (INDEPENDENT_AMBULATORY_CARE_PROVIDER_SITE_OTHER): Payer: Self-pay

## 2018-09-20 ENCOUNTER — Ambulatory Visit (INDEPENDENT_AMBULATORY_CARE_PROVIDER_SITE_OTHER): Payer: Medicaid Other | Admitting: Family Medicine

## 2018-09-20 ENCOUNTER — Encounter: Payer: Self-pay | Admitting: Family Medicine

## 2018-09-20 ENCOUNTER — Other Ambulatory Visit: Payer: Self-pay

## 2018-09-20 VITALS — BP 144/84 | HR 94 | Temp 98.1°F | Resp 15 | Ht 67.0 in | Wt 135.0 lb

## 2018-09-20 DIAGNOSIS — F5104 Psychophysiologic insomnia: Secondary | ICD-10-CM | POA: Diagnosis not present

## 2018-09-20 DIAGNOSIS — Z91199 Patient's noncompliance with other medical treatment and regimen due to unspecified reason: Secondary | ICD-10-CM

## 2018-09-20 DIAGNOSIS — E559 Vitamin D deficiency, unspecified: Secondary | ICD-10-CM | POA: Diagnosis not present

## 2018-09-20 DIAGNOSIS — I10 Essential (primary) hypertension: Secondary | ICD-10-CM | POA: Diagnosis not present

## 2018-09-20 DIAGNOSIS — F17218 Nicotine dependence, cigarettes, with other nicotine-induced disorders: Secondary | ICD-10-CM | POA: Diagnosis not present

## 2018-09-20 DIAGNOSIS — Z0000 Encounter for general adult medical examination without abnormal findings: Secondary | ICD-10-CM

## 2018-09-20 DIAGNOSIS — B182 Chronic viral hepatitis C: Secondary | ICD-10-CM

## 2018-09-20 DIAGNOSIS — E1169 Type 2 diabetes mellitus with other specified complication: Secondary | ICD-10-CM | POA: Diagnosis not present

## 2018-09-20 DIAGNOSIS — Z9119 Patient's noncompliance with other medical treatment and regimen: Secondary | ICD-10-CM

## 2018-09-20 DIAGNOSIS — Z1231 Encounter for screening mammogram for malignant neoplasm of breast: Secondary | ICD-10-CM | POA: Diagnosis not present

## 2018-09-20 LAB — GLUCOSE, POCT (MANUAL RESULT ENTRY): POC Glucose: 251 mg/dl — AB (ref 70–99)

## 2018-09-20 MED ORDER — AMLODIPINE BESYLATE 5 MG PO TABS: 5.0000 mg | ORAL_TABLET | Freq: Every day | ORAL | 5 refills | Status: DC

## 2018-09-20 MED ORDER — TRAZODONE HCL 50 MG PO TABS: ORAL_TABLET | ORAL | 3 refills | Status: DC

## 2018-09-20 MED ORDER — BLOOD GLUCOSE METER KIT: PACK | 0 refills | Status: DC

## 2018-09-20 MED ORDER — AMLODIPINE BESYLATE 10 MG PO TABS: 10.0000 mg | ORAL_TABLET | Freq: Every day | ORAL | 5 refills | Status: DC

## 2018-09-20 NOTE — Patient Instructions (Addendum)
F/U with MD in 2 months, re eval blood pressure and sleep  Dose increase in amlodipine 10 mg one daily  New for sleep is trazodone  Please get fasting labs this week, lipid, cmp and eGFR, HBA1C , TSH, vit D and cBc and Vit D   You will need medication for diabetes as blood sugar is high  Social distancing. Frequent hand washing with soap and water Keeping your hands off of your face.wear a mask and  Stay home These 3 practices will help to keep both you and your community healthy during this time. Please practice them faithfully!  Continue to cut back on cigrettes , now down to half, need to quit  Also reduce and stop alcohol and any street drugs

## 2018-09-21 DIAGNOSIS — Z5181 Encounter for therapeutic drug level monitoring: Secondary | ICD-10-CM | POA: Diagnosis not present

## 2018-09-21 DIAGNOSIS — I10 Essential (primary) hypertension: Secondary | ICD-10-CM | POA: Diagnosis not present

## 2018-09-22 DIAGNOSIS — I10 Essential (primary) hypertension: Secondary | ICD-10-CM | POA: Diagnosis not present

## 2018-09-23 DIAGNOSIS — I10 Essential (primary) hypertension: Secondary | ICD-10-CM | POA: Diagnosis not present

## 2018-09-25 ENCOUNTER — Encounter: Payer: Self-pay | Admitting: Family Medicine

## 2018-09-25 DIAGNOSIS — G4700 Insomnia, unspecified: Secondary | ICD-10-CM | POA: Insufficient documentation

## 2018-09-25 NOTE — Assessment & Plan Note (Signed)
Updated lab needed at/ before next visit. Latoya Walsh is reminded of the importance of commitment to daily physical activity for 30 minutes or more, as able and the need to limit carbohydrate intake to 30 to 60 grams per meal to help with blood sugar control.   The need to take medication as prescribed, test blood sugar as directed, and to call between visits if there is a concern that blood sugar is uncontrolled is also discussed.   Latoya Walsh is reminded of the importance of daily foot exam, annual eye examination, and good blood sugar, blood pressure and cholesterol control.  Diabetic Labs Latest Ref Rng & Units 10/02/2016 02/11/2016 08/16/2015 04/22/2015 10/19/2014  HbA1c <5.7 % 6.4(H) 6.4(H) 7.4(H) 7.5(H) -  Microalbumin <2.0 mg/dL - - - - -  Micro/Creat Ratio 0.0 - 30.0 mg/g - - - - -  Chol <200 mg/dL 146 - - 158 -  HDL >50 mg/dL 71 - - 54 -  Calc LDL <100 mg/dL 61 - - 84 -  Triglycerides <150 mg/dL 72 - - 102 -  Creatinine 0.50 - 1.05 mg/dL 1.07(H) 0.90 1.00 1.21(H) 0.97   BP/Weight 09/20/2018 01/24/2018 02/23/2017 11/11/2016 11/04/2016 07/23/2016 88/32/5498  Systolic BP 264 158 309 407 680 881 103  Diastolic BP 84 74 90 74 76 88 82  Wt. (Lbs) 135 142.04 145 - 137.75 123 128.04  BMI 21.14 22.25 23.4 - 22.23 19.85 20.67   Foot/eye exam completion dates 07/23/2016 09/19/2015  Foot Form Completion Done Done

## 2018-09-25 NOTE — Assessment & Plan Note (Signed)
Sleep hygiene reviewed and written information offered also. Prescription sent for  medication needed.  

## 2018-09-25 NOTE — Assessment & Plan Note (Signed)
Uncontrolled, increase medication dose DASH diet and commitment to daily physical activity for a minimum of 30 minutes discussed and encouraged, as a part of hypertension management. The importance of attaining a healthy weight is also discussed.  BP/Weight 09/20/2018 01/24/2018 02/23/2017 11/11/2016 11/04/2016 07/23/2016 17/47/1595  Systolic BP 396 728 979 150 413 643 837  Diastolic BP 84 74 90 74 76 88 82  Wt. (Lbs) 135 142.04 145 - 137.75 123 128.04  BMI 21.14 22.25 23.4 - 22.23 19.85 20.67

## 2018-09-25 NOTE — Progress Notes (Addendum)
Latoya Walsh     MRN: [id number]      DOB: 1961/03/08  HPI: Patient is in for annual physical exam Blood pressure is uncontrolled and she remains mainly non compliant, unfortunately, states has generalized headache x 1  Day and that she ran out of meds 1 day ago. C/o poor sleep, ha difficulty both falling and staying asleep Denies polyuria, polydipsia, blurred vision , or hypoglycemic episodes. Not testing blood sugar and not taking medication Immunization is reviewed , and  updated if needed.   PE: BP (!) 144/84   Pulse 94   Temp 98.1 F (36.7 C)   Resp 15   Ht 5\' 7"  (1.702 m)   Wt 135 lb (61.2 kg)   LMP 06/23/2010   SpO2 97%   BMI 21.14 kg/m   Pleasant  female, alert and oriented x 3, in no cardio-pulmonary distress. Afebrile. HEENT No facial trauma or asymetry. Sinuses non tender.  Extra occullar muscles intact, pupils equally reactive to light. External ears normal, tympanic membranes clear. Oropharynx moist, no exudate. Neck: supple, no adenopathy,JVD or thyromegaly Chest: Clear to ascultation bilaterally.No crackles or wheezes. Non tender to palpation  Breast: No exam perfumed, no c/o mass , pain or discharge from nipple, needs mammogram  Cardiovascular system; Heart sounds normal,  S1 and  S2 ,no S3.  No murmur, or thrill.  Peripheral pulses normal.  Abdomen: Soft, non tender,. No guarding, tenderness or rebound.   . .   Musculoskeletal exam: Full ROM of spine, hips , shoulders and knees. No deformity ,swelling or crepitus noted. No muscle wasting or atrophy.   Neurologic: Cranial nerves 2 to 12 intact. Power, tone ,sensation and reflexes normal throughout. No disturbance in gait. No tremor.  Skin: Intact, no ulceration, erythema , scaling or rash noted. Pigmentation normal throughout  Psych; Normal mood and affect. Poor Judgement and concentration   Assessment & Plan:  Annual physical exam Annual exam as documented. Counseling  done  re healthy lifestyle involving commitment to 150 minutes exercise per week, heart healthy diet, and attaining healthy weight Specific attention to alcohol, nicotine and drug use, she denies all but nicotine  Use and states drinks very seldom..The importance of adequate sleep also discussed.  Immunization and cancer screening needs are specifically addressed at this visit.   HTN, goal below 130/80 Uncontrolled, increase medication dose DASH diet and commitment to daily physical activity for a minimum of 30 minutes discussed and encouraged, as a part of hypertension management. The importance of attaining a healthy weight is also discussed.  BP/Weight 09/20/2018 01/24/2018 02/23/2017 11/11/2016 11/04/2016 07/23/2016 71/24/5809  Systolic BP 983 382 505 397 673 419 379  Diastolic BP 84 74 90 74 76 88 82  Wt. (Lbs) 135 142.04 145 - 137.75 123 128.04  BMI 21.14 22.25 23.4 - 22.23 19.85 20.67       Nicotine dependence Asked:confirms currently smokes cigarettes Assess: Unwilling to quit but cutting back Advise: needs to QUIT to reduce risk of cancer, cardio and cerebrovascular disease Assist: counseled for 5 minutes and literature provided Arrange: follow up in 3 months   Type 2 diabetes mellitus ([place]) Updated lab needed at/ before next visit. Latoya Walsh is reminded of the importance of commitment to daily physical activity for 30 minutes or more, as able and the need to limit carbohydrate intake to 30 to 60 grams per meal to help with blood sugar control.   The need to take medication as prescribed, test blood sugar  as directed, and to call between visits if there is a concern that blood sugar is uncontrolled is also discussed.   Latoya Walsh is reminded of the importance of daily foot exam, annual eye examination, and good blood sugar, blood pressure and cholesterol control.  Diabetic Labs Latest Ref Rng & Units 10/02/2016 02/11/2016 08/16/2015 04/22/2015 10/19/2014  HbA1c <5.7 % 6.4(H)  6.4(H) 7.4(H) 7.5(H) -  Microalbumin <2.0 mg/dL - - - - -  Micro/Creat Ratio 0.0 - 30.0 mg/g - - - - -  Chol <200 mg/dL 146 - - 158 -  HDL >50 mg/dL 71 - - 54 -  Calc LDL <100 mg/dL 61 - - 84 -  Triglycerides <150 mg/dL 72 - - 102 -  Creatinine 0.50 - 1.05 mg/dL 1.07(H) 0.90 1.00 1.21(H) 0.97   BP/Weight 09/20/2018 01/24/2018 02/23/2017 11/11/2016 11/04/2016 07/23/2016 46/06/7996  Systolic BP 721 587 276 184 859 276 394  Diastolic BP 84 74 90 74 76 88 82  Wt. (Lbs) 135 142.04 145 - 137.75 123 128.04  BMI 21.14 22.25 23.4 - 22.23 19.85 20.67   Foot/eye exam completion dates 07/23/2016 09/19/2015  Foot Form Completion Done Done        Non compliance with medical treatment Ongoing challenge states now attending day clinic in [facility] where she has support including treating MD  Insomnia Sleep hygiene reviewed and written information offered also. Prescription sent for  medication needed.

## 2018-09-25 NOTE — Assessment & Plan Note (Signed)
Ongoing challenge states now attending day clinic in [facility] where she has support including treating MD

## 2018-09-25 NOTE — Assessment & Plan Note (Addendum)
Annual exam as documented. Counseling done  re healthy lifestyle involving commitment to 150 minutes exercise per week, heart healthy diet, and attaining healthy weight Specific attention to alcohol, nicotine and drug use, she denies all but nicotine  Use and states drinks very seldom..The importance of adequate sleep also discussed.  Immunization and cancer screening needs are specifically addressed at this visit.

## 2018-09-25 NOTE — Assessment & Plan Note (Signed)
Asked:confirms currently smokes cigarettes Assess: Unwilling to quit but cutting back Advise: needs to QUIT to reduce risk of cancer, cardio and cerebrovascular disease Assist: counseled for 5 minutes and literature provided Arrange: follow up in 3 months  

## 2018-09-26 DIAGNOSIS — Z5181 Encounter for therapeutic drug level monitoring: Secondary | ICD-10-CM | POA: Diagnosis not present

## 2018-09-26 DIAGNOSIS — I10 Essential (primary) hypertension: Secondary | ICD-10-CM | POA: Diagnosis not present

## 2018-09-27 DIAGNOSIS — I10 Essential (primary) hypertension: Secondary | ICD-10-CM | POA: Diagnosis not present

## 2018-09-27 DIAGNOSIS — E559 Vitamin D deficiency, unspecified: Secondary | ICD-10-CM | POA: Diagnosis not present

## 2018-09-27 DIAGNOSIS — E1169 Type 2 diabetes mellitus with other specified complication: Secondary | ICD-10-CM | POA: Diagnosis not present

## 2018-09-28 ENCOUNTER — Other Ambulatory Visit: Payer: Self-pay | Admitting: Family Medicine

## 2018-09-28 DIAGNOSIS — I10 Essential (primary) hypertension: Secondary | ICD-10-CM | POA: Diagnosis not present

## 2018-09-28 LAB — TSH: TSH: 2.32 mIU/L

## 2018-09-28 LAB — COMPLETE METABOLIC PANEL WITH GFR
AG Ratio: 1.3 (calc) (ref 1.0–2.5)
ALT: 39 U/L — ABNORMAL HIGH (ref 6–29)
AST: 20 U/L (ref 10–35)
Albumin: 3.6 g/dL (ref 3.6–5.1)
Alkaline phosphatase (APISO): 102 U/L (ref 37–153)
BUN/Creatinine Ratio: 23 (calc) — ABNORMAL HIGH (ref 6–22)
BUN: 25 mg/dL (ref 7–25)
CO2: 25 mmol/L (ref 20–32)
Calcium: 9.3 mg/dL (ref 8.6–10.4)
Chloride: 109 mmol/L (ref 98–110)
Creat: 1.08 mg/dL — ABNORMAL HIGH (ref 0.50–1.05)
GFR, Est African American: 67 mL/min/{1.73_m2} (ref >=60)
GFR, Est Non African American: 58 mL/min/{1.73_m2} — ABNORMAL LOW (ref >=60)
Globulin: 2.7 g/dL (calc) (ref 1.9–3.7)
Glucose, Bld: 161 mg/dL — ABNORMAL HIGH (ref 65–99)
Potassium: 4.5 mmol/L (ref 3.5–5.3)
Sodium: 141 mmol/L (ref 135–146)
Total Bilirubin: 0.4 mg/dL (ref 0.2–1.2)
Total Protein: 6.3 g/dL (ref 6.1–8.1)

## 2018-09-28 LAB — VITAMIN D 25 HYDROXY (VIT D DEFICIENCY, FRACTURES): Vit D, 25-Hydroxy: 16 ng/mL — ABNORMAL LOW (ref 30–100)

## 2018-09-28 LAB — CBC
HCT: 40.8 % (ref 35.0–45.0)
Hemoglobin: 13.6 g/dL (ref 11.7–15.5)
MCH: 30.4 pg (ref 27.0–33.0)
MCHC: 33.3 g/dL (ref 32.0–36.0)
MCV: 91.3 fL (ref 80.0–100.0)
MPV: 11.5 fL (ref 7.5–12.5)
Platelets: 184 10*3/uL (ref 140–400)
RBC: 4.47 10*6/uL (ref 3.80–5.10)
RDW: 12.2 % (ref 11.0–15.0)
WBC: 6 10*3/uL (ref 3.8–10.8)

## 2018-09-28 LAB — HEMOGLOBIN A1C
Hgb A1c MFr Bld: 7.4 % of total Hgb — ABNORMAL HIGH (ref <5.7)
Mean Plasma Glucose: 166 (calc)
eAG (mmol/L): 9.2 (calc)

## 2018-09-28 LAB — LIPID PANEL
Cholesterol: 181 mg/dL (ref <200)
HDL: 52 mg/dL (ref >=50)
LDL Cholesterol (Calc): 106 mg/dL (calc) — ABNORMAL HIGH
Non-HDL Cholesterol (Calc): 129 mg/dL (calc) (ref <130)
Total CHOL/HDL Ratio: 3.5 (calc) (ref <5.0)
Triglycerides: 134 mg/dL (ref <150)

## 2018-09-28 MED ORDER — METFORMIN HCL 500 MG PO TABS: 500.0000 mg | ORAL_TABLET | Freq: Two times a day (BID) | ORAL | 5 refills | Status: DC

## 2018-09-28 MED ORDER — ROSUVASTATIN CALCIUM 5 MG PO TABS: 5.0000 mg | ORAL_TABLET | Freq: Every day | ORAL | 5 refills | Status: DC

## 2018-09-29 DIAGNOSIS — I10 Essential (primary) hypertension: Secondary | ICD-10-CM | POA: Diagnosis not present

## 2018-09-30 DIAGNOSIS — I10 Essential (primary) hypertension: Secondary | ICD-10-CM | POA: Diagnosis not present

## 2018-10-03 DIAGNOSIS — I10 Essential (primary) hypertension: Secondary | ICD-10-CM | POA: Diagnosis not present

## 2018-10-03 DIAGNOSIS — Z5181 Encounter for therapeutic drug level monitoring: Secondary | ICD-10-CM | POA: Diagnosis not present

## 2018-10-04 DIAGNOSIS — I10 Essential (primary) hypertension: Secondary | ICD-10-CM | POA: Diagnosis not present

## 2018-10-05 DIAGNOSIS — I10 Essential (primary) hypertension: Secondary | ICD-10-CM | POA: Diagnosis not present

## 2018-10-06 DIAGNOSIS — I10 Essential (primary) hypertension: Secondary | ICD-10-CM | POA: Diagnosis not present

## 2018-10-07 DIAGNOSIS — I10 Essential (primary) hypertension: Secondary | ICD-10-CM | POA: Diagnosis not present

## 2018-10-10 DIAGNOSIS — I10 Essential (primary) hypertension: Secondary | ICD-10-CM | POA: Diagnosis not present

## 2018-10-10 DIAGNOSIS — M545 Low back pain: Secondary | ICD-10-CM | POA: Diagnosis not present

## 2018-10-11 DIAGNOSIS — I10 Essential (primary) hypertension: Secondary | ICD-10-CM | POA: Diagnosis not present

## 2018-10-12 DIAGNOSIS — I10 Essential (primary) hypertension: Secondary | ICD-10-CM | POA: Diagnosis not present

## 2018-10-12 DIAGNOSIS — Z5181 Encounter for therapeutic drug level monitoring: Secondary | ICD-10-CM | POA: Diagnosis not present

## 2018-10-13 DIAGNOSIS — I10 Essential (primary) hypertension: Secondary | ICD-10-CM | POA: Diagnosis not present

## 2018-10-14 DIAGNOSIS — I10 Essential (primary) hypertension: Secondary | ICD-10-CM | POA: Diagnosis not present

## 2018-10-17 DIAGNOSIS — I10 Essential (primary) hypertension: Secondary | ICD-10-CM | POA: Diagnosis not present

## 2018-10-18 DIAGNOSIS — Z5181 Encounter for therapeutic drug level monitoring: Secondary | ICD-10-CM | POA: Diagnosis not present

## 2018-10-18 DIAGNOSIS — I10 Essential (primary) hypertension: Secondary | ICD-10-CM | POA: Diagnosis not present

## 2018-10-19 DIAGNOSIS — I10 Essential (primary) hypertension: Secondary | ICD-10-CM | POA: Diagnosis not present

## 2018-10-20 DIAGNOSIS — I10 Essential (primary) hypertension: Secondary | ICD-10-CM | POA: Diagnosis not present

## 2018-10-21 DIAGNOSIS — I10 Essential (primary) hypertension: Secondary | ICD-10-CM | POA: Diagnosis not present

## 2018-10-24 DIAGNOSIS — I10 Essential (primary) hypertension: Secondary | ICD-10-CM | POA: Diagnosis not present

## 2018-10-25 DIAGNOSIS — I10 Essential (primary) hypertension: Secondary | ICD-10-CM | POA: Diagnosis not present

## 2018-10-26 DIAGNOSIS — I10 Essential (primary) hypertension: Secondary | ICD-10-CM | POA: Diagnosis not present

## 2018-10-27 DIAGNOSIS — I10 Essential (primary) hypertension: Secondary | ICD-10-CM | POA: Diagnosis not present

## 2018-10-28 DIAGNOSIS — I10 Essential (primary) hypertension: Secondary | ICD-10-CM | POA: Diagnosis not present

## 2018-10-31 DIAGNOSIS — I10 Essential (primary) hypertension: Secondary | ICD-10-CM | POA: Diagnosis not present

## 2018-11-01 DIAGNOSIS — I10 Essential (primary) hypertension: Secondary | ICD-10-CM | POA: Diagnosis not present

## 2018-11-02 DIAGNOSIS — I10 Essential (primary) hypertension: Secondary | ICD-10-CM | POA: Diagnosis not present

## 2018-11-03 DIAGNOSIS — I10 Essential (primary) hypertension: Secondary | ICD-10-CM | POA: Diagnosis not present

## 2018-11-04 DIAGNOSIS — I10 Essential (primary) hypertension: Secondary | ICD-10-CM | POA: Diagnosis not present

## 2018-11-07 ENCOUNTER — Ambulatory Visit (INDEPENDENT_AMBULATORY_CARE_PROVIDER_SITE_OTHER): Payer: Medicaid Other | Admitting: Family Medicine

## 2018-11-07 ENCOUNTER — Other Ambulatory Visit: Payer: Self-pay

## 2018-11-07 ENCOUNTER — Encounter: Payer: Self-pay | Admitting: Family Medicine

## 2018-11-07 VITALS — BP 142/88 | HR 86 | Temp 98.6°F | Resp 15 | Ht 67.0 in | Wt 143.0 lb

## 2018-11-07 DIAGNOSIS — F17218 Nicotine dependence, cigarettes, with other nicotine-induced disorders: Secondary | ICD-10-CM

## 2018-11-07 DIAGNOSIS — G44211 Episodic tension-type headache, intractable: Secondary | ICD-10-CM

## 2018-11-07 DIAGNOSIS — Z9119 Patient's noncompliance with other medical treatment and regimen: Secondary | ICD-10-CM | POA: Diagnosis not present

## 2018-11-07 DIAGNOSIS — Z91199 Patient's noncompliance with other medical treatment and regimen due to unspecified reason: Secondary | ICD-10-CM

## 2018-11-07 DIAGNOSIS — F419 Anxiety disorder, unspecified: Secondary | ICD-10-CM | POA: Diagnosis not present

## 2018-11-07 DIAGNOSIS — I10 Essential (primary) hypertension: Secondary | ICD-10-CM | POA: Diagnosis not present

## 2018-11-07 DIAGNOSIS — F329 Major depressive disorder, single episode, unspecified: Secondary | ICD-10-CM

## 2018-11-07 MED ORDER — FLUOXETINE HCL 10 MG PO TABS
10.0000 mg | ORAL_TABLET | Freq: Every day | ORAL | 3 refills | Status: DC
Start: 2018-11-07 — End: 2019-01-24

## 2018-11-07 MED ORDER — AMLODIPINE BESYLATE 10 MG PO TABS: 10.0000 mg | ORAL_TABLET | Freq: Every day | ORAL | 5 refills | Status: DC

## 2018-11-07 MED ORDER — ROSUVASTATIN CALCIUM 5 MG PO TABS: 5.0000 mg | ORAL_TABLET | Freq: Every day | ORAL | 5 refills | Status: DC

## 2018-11-07 MED ORDER — AMLODIPINE BESYLATE 5 MG PO TABS: 5.0000 mg | ORAL_TABLET | Freq: Every day | ORAL | 3 refills | Status: DC

## 2018-11-07 MED ORDER — METFORMIN HCL 500 MG PO TABS: 500.0000 mg | ORAL_TABLET | Freq: Two times a day (BID) | ORAL | 5 refills | Status: DC

## 2018-11-07 MED ORDER — ERGOCALCIFEROL 1.25 MG (50000 UT) PO CAPS: 50000.0000 [IU] | ORAL_CAPSULE | ORAL | 1 refills | Status: DC

## 2018-11-07 NOTE — Patient Instructions (Addendum)
F/u with MD in 2.5 months, call if you need me before  New medication for depression is fluoxetine 10 mg daily  Once weekly vit D is new also  Lower dose amlodipine for blood pressure is 5 mg  Please get mammogram in 2 weeks as scheduled  Please cut back and plan to stop smoking , harmful to you  Thanks for choosing [facility], we consider it a privelige to serve you.

## 2018-11-08 DIAGNOSIS — I10 Essential (primary) hypertension: Secondary | ICD-10-CM | POA: Diagnosis not present

## 2018-11-08 DIAGNOSIS — Z5181 Encounter for therapeutic drug level monitoring: Secondary | ICD-10-CM | POA: Diagnosis not present

## 2018-11-09 DIAGNOSIS — I10 Essential (primary) hypertension: Secondary | ICD-10-CM | POA: Diagnosis not present

## 2018-11-10 DIAGNOSIS — I10 Essential (primary) hypertension: Secondary | ICD-10-CM | POA: Diagnosis not present

## 2018-11-11 DIAGNOSIS — I10 Essential (primary) hypertension: Secondary | ICD-10-CM | POA: Diagnosis not present

## 2018-11-13 ENCOUNTER — Encounter: Payer: Self-pay | Admitting: Family Medicine

## 2018-11-13 DIAGNOSIS — R519 Headache, unspecified: Secondary | ICD-10-CM | POA: Insufficient documentation

## 2018-11-13 NOTE — Assessment & Plan Note (Signed)
Asked:confirms currently smokes cigarettes Assess: Unwilling to quit but cutting back Advise: needs to QUIT to reduce risk of cancer, cardio and cerebrovascular disease Assist: counseled for 5 minutes and literature provided Arrange: follow up in 3 months  

## 2018-11-13 NOTE — Assessment & Plan Note (Signed)
Inadequately controlled , non compliant x 2 days, dose reduction in medication Review in 2 months

## 2018-11-13 NOTE — Assessment & Plan Note (Signed)
Ongoing challenge, compliance is again encouraged

## 2018-11-13 NOTE — Progress Notes (Signed)
   Latoya Walsh     MRN: [id number]      DOB: 19-Apr-1960   HPI [name] is here for follow up and re-evaluation of chronic medical conditions, medication management and review of any available recent lab and radiology data.  Denies recent fever or chills. Denies sinus pressure, nasal congestion, ear pain or sore throat. Denies chest congestion, productive cough or wheezing. Denies chest pains, palpitations and leg swelling 1 week h/o left middle quadrant and upper quadrant pain, denies nausea or vomit, no change in bM.   Denies dysuria, frequency, hesitancy or incontinence. Denies joint pain, swelling and limitation in mobility. C/o daily  Headaches, denies seizures or any new localized weakness or numbness C/o  depression, anxiety and  Insomnia.not suicidal or homicidal Denies skin break down or rash.   PE  BP (!) 142/88   Pulse 86   Temp 98.6 F (37 C) (Temporal)   Resp 15   Ht 5\' 7"  (1.702 m)   Wt 143 lb (64.9 kg)   LMP 06/23/2010   SpO2 96%   BMI 22.40 kg/m   Patient alert and oriented and in no cardiopulmonary distress.  HEENT: No facial asymmetry, EOMI,   oropharynx pink and moist.  Neck supple Chest: Clear to auscultation bilaterally.  CVS: S1, S2 no murmurs, no S3.Regular rate.  Abdomen:non tender , no guardingor rebound   Ext: No edema  MS: Adequate ROM spine, shoulders, hips and knees.  Skin: Intact, no  rash noted.  Psych: Good eye contact. Memory impaired both  anxious and  depressed appearing.  CNS: CN 2-12 intact, power,  normal throughout.no focal deficits noted.   Assessment & Plan  HTN, goal below 130/80 Inadequately controlled , non compliant x 2 days, dose reduction in medication Review in 2 months  Nicotine dependence Asked:confirms currently smokes cigarettes Assess: Unwilling to quit but cutting back Advise: needs to QUIT to reduce risk of cancer, cardio and cerebrovascular disease Assist: counseled for 5 minutes and literature  provided Arrange: follow up in 3 months   Anxiety and depression Untreated and uncontrolled, start daily fluoxetine, not suicdal or hoimicidal  Non compliance with medical treatment Ongoing challenge, compliance is again encouraged  Headache Triggered by anxiety and poor sleep , will start fluooxetine and work on sleep

## 2018-11-13 NOTE — Assessment & Plan Note (Signed)
Untreated and uncontrolled, start daily fluoxetine, not suicdal or hoimicidal

## 2018-11-13 NOTE — Assessment & Plan Note (Signed)
Triggered by anxiety and poor sleep , will start fluooxetine and work on sleep

## 2018-11-14 DIAGNOSIS — I10 Essential (primary) hypertension: Secondary | ICD-10-CM | POA: Diagnosis not present

## 2018-11-15 DIAGNOSIS — I10 Essential (primary) hypertension: Secondary | ICD-10-CM | POA: Diagnosis not present

## 2018-11-16 DIAGNOSIS — I10 Essential (primary) hypertension: Secondary | ICD-10-CM | POA: Diagnosis not present

## 2018-11-17 DIAGNOSIS — I10 Essential (primary) hypertension: Secondary | ICD-10-CM | POA: Diagnosis not present

## 2018-11-18 DIAGNOSIS — I10 Essential (primary) hypertension: Secondary | ICD-10-CM | POA: Diagnosis not present

## 2018-11-21 ENCOUNTER — Other Ambulatory Visit: Payer: Self-pay

## 2018-11-21 ENCOUNTER — Ambulatory Visit: Payer: Medicaid Other | Admitting: Family Medicine

## 2018-11-21 ENCOUNTER — Ambulatory Visit (HOSPITAL_COMMUNITY)
Admission: RE | Admit: 2018-11-21 | Discharge: 2018-11-21 | Disposition: A | Discharge status: Home / Self Care | Payer: Medicaid Other | Source: Ambulatory Visit | Attending: Family Medicine | Admitting: Family Medicine

## 2018-11-21 DIAGNOSIS — Z1231 Encounter for screening mammogram for malignant neoplasm of breast: Secondary | ICD-10-CM | POA: Insufficient documentation

## 2018-11-21 DIAGNOSIS — I10 Essential (primary) hypertension: Secondary | ICD-10-CM | POA: Diagnosis not present

## 2018-11-21 DIAGNOSIS — Z5181 Encounter for therapeutic drug level monitoring: Secondary | ICD-10-CM | POA: Diagnosis not present

## 2018-11-22 DIAGNOSIS — I10 Essential (primary) hypertension: Secondary | ICD-10-CM | POA: Diagnosis not present

## 2018-11-23 DIAGNOSIS — I10 Essential (primary) hypertension: Secondary | ICD-10-CM | POA: Diagnosis not present

## 2018-11-24 DIAGNOSIS — I10 Essential (primary) hypertension: Secondary | ICD-10-CM | POA: Diagnosis not present

## 2018-11-25 DIAGNOSIS — I10 Essential (primary) hypertension: Secondary | ICD-10-CM | POA: Diagnosis not present

## 2018-11-28 DIAGNOSIS — I10 Essential (primary) hypertension: Secondary | ICD-10-CM | POA: Diagnosis not present

## 2018-11-28 DIAGNOSIS — Z5181 Encounter for therapeutic drug level monitoring: Secondary | ICD-10-CM | POA: Diagnosis not present

## 2018-11-29 DIAGNOSIS — I10 Essential (primary) hypertension: Secondary | ICD-10-CM | POA: Diagnosis not present

## 2018-11-30 DIAGNOSIS — I10 Essential (primary) hypertension: Secondary | ICD-10-CM | POA: Diagnosis not present

## 2018-12-01 DIAGNOSIS — I10 Essential (primary) hypertension: Secondary | ICD-10-CM | POA: Diagnosis not present

## 2018-12-02 DIAGNOSIS — I10 Essential (primary) hypertension: Secondary | ICD-10-CM | POA: Diagnosis not present

## 2018-12-05 DIAGNOSIS — I10 Essential (primary) hypertension: Secondary | ICD-10-CM | POA: Diagnosis not present

## 2018-12-05 DIAGNOSIS — Z5181 Encounter for therapeutic drug level monitoring: Secondary | ICD-10-CM | POA: Diagnosis not present

## 2018-12-06 DIAGNOSIS — I10 Essential (primary) hypertension: Secondary | ICD-10-CM | POA: Diagnosis not present

## 2018-12-07 DIAGNOSIS — I10 Essential (primary) hypertension: Secondary | ICD-10-CM | POA: Diagnosis not present

## 2018-12-08 DIAGNOSIS — I10 Essential (primary) hypertension: Secondary | ICD-10-CM | POA: Diagnosis not present

## 2018-12-09 DIAGNOSIS — I10 Essential (primary) hypertension: Secondary | ICD-10-CM | POA: Diagnosis not present

## 2018-12-12 DIAGNOSIS — I10 Essential (primary) hypertension: Secondary | ICD-10-CM | POA: Diagnosis not present

## 2018-12-13 DIAGNOSIS — I10 Essential (primary) hypertension: Secondary | ICD-10-CM | POA: Diagnosis not present

## 2018-12-14 DIAGNOSIS — I10 Essential (primary) hypertension: Secondary | ICD-10-CM | POA: Diagnosis not present

## 2018-12-15 DIAGNOSIS — I10 Essential (primary) hypertension: Secondary | ICD-10-CM | POA: Diagnosis not present

## 2018-12-16 DIAGNOSIS — I10 Essential (primary) hypertension: Secondary | ICD-10-CM | POA: Diagnosis not present

## 2018-12-19 DIAGNOSIS — I10 Essential (primary) hypertension: Secondary | ICD-10-CM | POA: Diagnosis not present

## 2018-12-19 DIAGNOSIS — Z5181 Encounter for therapeutic drug level monitoring: Secondary | ICD-10-CM | POA: Diagnosis not present

## 2018-12-20 DIAGNOSIS — I10 Essential (primary) hypertension: Secondary | ICD-10-CM | POA: Diagnosis not present

## 2018-12-21 DIAGNOSIS — I10 Essential (primary) hypertension: Secondary | ICD-10-CM | POA: Diagnosis not present

## 2018-12-22 DIAGNOSIS — I10 Essential (primary) hypertension: Secondary | ICD-10-CM | POA: Diagnosis not present

## 2018-12-23 DIAGNOSIS — I10 Essential (primary) hypertension: Secondary | ICD-10-CM | POA: Diagnosis not present

## 2018-12-26 DIAGNOSIS — Z5181 Encounter for therapeutic drug level monitoring: Secondary | ICD-10-CM | POA: Diagnosis not present

## 2018-12-26 DIAGNOSIS — I10 Essential (primary) hypertension: Secondary | ICD-10-CM | POA: Diagnosis not present

## 2018-12-27 DIAGNOSIS — I10 Essential (primary) hypertension: Secondary | ICD-10-CM | POA: Diagnosis not present

## 2018-12-28 DIAGNOSIS — I10 Essential (primary) hypertension: Secondary | ICD-10-CM | POA: Diagnosis not present

## 2018-12-29 DIAGNOSIS — I10 Essential (primary) hypertension: Secondary | ICD-10-CM | POA: Diagnosis not present

## 2018-12-30 DIAGNOSIS — I10 Essential (primary) hypertension: Secondary | ICD-10-CM | POA: Diagnosis not present

## 2019-01-02 DIAGNOSIS — I10 Essential (primary) hypertension: Secondary | ICD-10-CM | POA: Diagnosis not present

## 2019-01-02 DIAGNOSIS — Z5181 Encounter for therapeutic drug level monitoring: Secondary | ICD-10-CM | POA: Diagnosis not present

## 2019-01-03 DIAGNOSIS — I10 Essential (primary) hypertension: Secondary | ICD-10-CM | POA: Diagnosis not present

## 2019-01-04 DIAGNOSIS — I10 Essential (primary) hypertension: Secondary | ICD-10-CM | POA: Diagnosis not present

## 2019-01-05 DIAGNOSIS — I10 Essential (primary) hypertension: Secondary | ICD-10-CM | POA: Diagnosis not present

## 2019-01-06 DIAGNOSIS — I10 Essential (primary) hypertension: Secondary | ICD-10-CM | POA: Diagnosis not present

## 2019-01-09 DIAGNOSIS — Z5181 Encounter for therapeutic drug level monitoring: Secondary | ICD-10-CM | POA: Diagnosis not present

## 2019-01-09 DIAGNOSIS — I10 Essential (primary) hypertension: Secondary | ICD-10-CM | POA: Diagnosis not present

## 2019-01-10 DIAGNOSIS — I10 Essential (primary) hypertension: Secondary | ICD-10-CM | POA: Diagnosis not present

## 2019-01-11 DIAGNOSIS — I10 Essential (primary) hypertension: Secondary | ICD-10-CM | POA: Diagnosis not present

## 2019-01-12 DIAGNOSIS — I10 Essential (primary) hypertension: Secondary | ICD-10-CM | POA: Diagnosis not present

## 2019-01-13 DIAGNOSIS — I10 Essential (primary) hypertension: Secondary | ICD-10-CM | POA: Diagnosis not present

## 2019-01-16 DIAGNOSIS — I10 Essential (primary) hypertension: Secondary | ICD-10-CM | POA: Diagnosis not present

## 2019-01-16 DIAGNOSIS — Z5181 Encounter for therapeutic drug level monitoring: Secondary | ICD-10-CM | POA: Diagnosis not present

## 2019-01-17 DIAGNOSIS — I10 Essential (primary) hypertension: Secondary | ICD-10-CM | POA: Diagnosis not present

## 2019-01-18 DIAGNOSIS — I10 Essential (primary) hypertension: Secondary | ICD-10-CM | POA: Diagnosis not present

## 2019-01-19 DIAGNOSIS — I10 Essential (primary) hypertension: Secondary | ICD-10-CM | POA: Diagnosis not present

## 2019-01-20 ENCOUNTER — Other Ambulatory Visit: Payer: Self-pay | Admitting: Family Medicine

## 2019-01-20 DIAGNOSIS — I10 Essential (primary) hypertension: Secondary | ICD-10-CM | POA: Diagnosis not present

## 2019-01-23 DIAGNOSIS — Z5181 Encounter for therapeutic drug level monitoring: Secondary | ICD-10-CM | POA: Diagnosis not present

## 2019-01-23 DIAGNOSIS — I10 Essential (primary) hypertension: Secondary | ICD-10-CM | POA: Diagnosis not present

## 2019-01-24 ENCOUNTER — Encounter: Payer: Self-pay | Admitting: Family Medicine

## 2019-01-24 ENCOUNTER — Ambulatory Visit (INDEPENDENT_AMBULATORY_CARE_PROVIDER_SITE_OTHER): Payer: Medicaid Other | Admitting: Family Medicine

## 2019-01-24 ENCOUNTER — Other Ambulatory Visit: Payer: Self-pay

## 2019-01-24 VITALS — BP 130/68 | HR 110 | Resp 15 | Ht 67.0 in | Wt 140.0 lb

## 2019-01-24 DIAGNOSIS — I10 Essential (primary) hypertension: Secondary | ICD-10-CM | POA: Diagnosis not present

## 2019-01-24 DIAGNOSIS — F17218 Nicotine dependence, cigarettes, with other nicotine-induced disorders: Secondary | ICD-10-CM | POA: Diagnosis not present

## 2019-01-24 DIAGNOSIS — E1169 Type 2 diabetes mellitus with other specified complication: Secondary | ICD-10-CM | POA: Diagnosis not present

## 2019-01-24 DIAGNOSIS — Z13220 Encounter for screening for lipoid disorders: Secondary | ICD-10-CM

## 2019-01-24 DIAGNOSIS — E1140 Type 2 diabetes mellitus with diabetic neuropathy, unspecified: Secondary | ICD-10-CM | POA: Diagnosis not present

## 2019-01-24 DIAGNOSIS — F17210 Nicotine dependence, cigarettes, uncomplicated: Secondary | ICD-10-CM

## 2019-01-24 DIAGNOSIS — F324 Major depressive disorder, single episode, in partial remission: Secondary | ICD-10-CM

## 2019-01-24 MED ORDER — FLUOXETINE HCL 20 MG PO TABS: 20.0000 mg | ORAL_TABLET | Freq: Every day | ORAL | 5 refills | Status: DC

## 2019-01-24 MED ORDER — AMLODIPINE BESYLATE 5 MG PO TABS: 5.0000 mg | ORAL_TABLET | Freq: Every day | ORAL | 5 refills | Status: DC

## 2019-01-24 NOTE — Patient Instructions (Signed)
F/U second  week in January, call if you need me before  Please get flu vaccine, call and come in  Excellent BP and good blood sugar  Increase fluoxetine to 20 mg daily  Microalb, fasting lipid, cmp and EGFr and  HBA1C early January  Thanks for choosing [facility], we consider it a privelige to serve you.

## 2019-01-25 ENCOUNTER — Encounter: Payer: Self-pay | Admitting: Family Medicine

## 2019-01-25 DIAGNOSIS — I10 Essential (primary) hypertension: Secondary | ICD-10-CM | POA: Diagnosis not present

## 2019-01-25 DIAGNOSIS — F324 Major depressive disorder, single episode, in partial remission: Secondary | ICD-10-CM | POA: Insufficient documentation

## 2019-01-25 NOTE — Assessment & Plan Note (Signed)
Controlled, no change in medication Latoya Walsh is reminded of the importance of commitment to daily physical activity for 30 minutes or more, as able and the need to limit carbohydrate intake to 30 to 60 grams per meal to help with blood sugar control.   The need to take medication as prescribed, test blood sugar as directed, and to call between visits if there is a concern that blood sugar is uncontrolled is also discussed.   Latoya Walsh is reminded of the importance of daily foot exam, annual eye examination, and good blood sugar, blood pressure and cholesterol control.  Diabetic Labs Latest Ref Rng & Units 09/27/2018 10/02/2016 02/11/2016 08/16/2015 04/22/2015  HbA1c <5.7 % of total Hgb 7.4(H) 6.4(H) 6.4(H) 7.4(H) 7.5(H)  Microalbumin <2.0 mg/dL - - - - -  Micro/Creat Ratio 0.0 - 30.0 mg/g - - - - -  Chol <200 mg/dL 181 146 - - 158  HDL > OR = 50 mg/dL 52 71 - - 54  Calc LDL mg/dL (calc) 106(H) 61 - - 84  Triglycerides <150 mg/dL 134 72 - - 102  Creatinine 0.50 - 1.05 mg/dL 1.08(H) 1.07(H) 0.90 1.00 1.21(H)   BP/Weight 01/24/2019 11/07/2018 09/20/2018 01/24/2018 02/23/2017 11/11/2016 0000000  Systolic BP [id number] A999333 [id number] A999333 [id number]  Diastolic BP 68 88 84 74 90 74 76  Wt. (Lbs) 140 143 135 142.04 145 - 137.75  BMI 21.93 22.4 21.14 22.25 23.4 - 22.23   Foot/eye exam completion dates 01/24/2019 07/23/2016  Foot Form Completion Done Done

## 2019-01-25 NOTE — Assessment & Plan Note (Signed)
Asked:confirms currently smokes cigarettes Assess: Unwilling to quit but cutting back Advise: needs to QUIT to reduce risk of cancer, cardio and cerebrovascular disease Assist: counseled for 5 minutes and literature provided Arrange: follow up in 3 months  

## 2019-01-25 NOTE — Assessment & Plan Note (Signed)
Not suicidal or homicidal, unwilling to engage in therapy. Increase dose of fluoxetine and re eval

## 2019-01-25 NOTE — Assessment & Plan Note (Signed)
Adequate thouh sub opptimal control DASH diet and commitment to daily physical activity for a minimum of 30 minutes discussed and encouraged, as a part of hypertension management. The importance of attaining a healthy weight is also discussed.  BP/Weight 01/24/2019 11/07/2018 09/20/2018 01/24/2018 02/23/2017 11/11/2016 0000000  Systolic BP [id number] A999333 [id number] A999333 [id number]  Diastolic BP 68 88 84 74 90 74 76  Wt. (Lbs) 140 143 135 142.04 145 - 137.75  BMI 21.93 22.4 21.14 22.25 23.4 - 22.23

## 2019-01-25 NOTE — Progress Notes (Signed)
MARQUISE DOUBLIN     MRN: [id number]      DOB: 12-Oct-1960   HPI Latoya Walsh is here for follow up and re-evaluation of chronic medical conditions, medication management and review of any available recent lab and radiology data.  Preventive health is updated, specifically  Cancer screening and Immunization.  Refuses all necessary screening and also the influenza vaccine Questions or concerns regarding consultations or procedures which the PT has had in the interim are  addressed. The PT denies any adverse reactions to current medications since the last visit.  There are no new concerns.  There are no specific complaints   ROS Denies recent fever or chills. Denies sinus pressure, nasal congestion, ear pain or sore throat. Denies chest congestion, productive cough or wheezing. Denies chest pains, palpitations and leg swelling Denies abdominal pain, nausea, vomiting,diarrhea or constipation.   Denies dysuria, frequency, hesitancy or incontinence. Denies joint pain, swelling and limitation in mobility. Denies headaches, seizures, numbness, or tingling. C/o  depression, anxiety or insomnia. Denies skin break down or rash. Denies polyuria, polydipsia, blurred vision , or hypoglycemic episodes.    PE  BP 130/68   Pulse (!) 110   Resp 15   Ht 5\' 7"  (1.702 m)   Wt 140 lb (63.5 kg)   LMP 06/23/2010   SpO2 96%   BMI 21.93 kg/m   Patient alert and oriented and in no cardiopulmonary distress.  HEENT: No facial asymmetry, EOMI,     Neck supple .  Chest: Clear to auscultation bilaterally.  CVS: S1, S2 no murmurs, no S3.Regular rate.  ABD: Soft non tender.   Ext: No edema  MS: Adequate ROM spine, shoulders, hips and knees.  Skin: Intact, severe tinea pedis and onychomycosis bilaterally Psych: Good eye contact, normal affect. Memory intact not anxious or depressed appearing.  CNS: CN 2-12 intact, power,  normal throughout.no focal deficits noted.   Assessment & Plan  HTN,  goal below 130/80 Adequate thouh sub opptimal control DASH diet and commitment to daily physical activity for a minimum of 30 minutes discussed and encouraged, as a part of hypertension management. The importance of attaining a healthy weight is also discussed.  BP/Weight 01/24/2019 11/07/2018 09/20/2018 01/24/2018 02/23/2017 11/11/2016 0000000  Systolic BP [id number] 123456 A999333 [id number] [id number]  Diastolic BP 68 88 84 74 90 74 76  Wt. (Lbs) 140 143 135 142.04 145 - 137.75  BMI 21.93 22.4 21.14 22.25 23.4 - 22.23       Controlled type 2 diabetes with neuropathy (HCC) Controlled, no change in medication Ms. Kofler is reminded of the importance of commitment to daily physical activity for 30 minutes or more, as able and the need to limit carbohydrate intake to 30 to 60 grams per meal to help with blood sugar control.   The need to take medication as prescribed, test blood sugar as directed, and to call between visits if there is a concern that blood sugar is uncontrolled is also discussed.   Ms. Charley is reminded of the importance of daily foot exam, annual eye examination, and good blood sugar, blood pressure and cholesterol control.  Diabetic Labs Latest Ref Rng & Units 09/27/2018 10/02/2016 02/11/2016 08/16/2015 04/22/2015  HbA1c <5.7 % of total Hgb 7.4(H) 6.4(H) 6.4(H) 7.4(H) 7.5(H)  Microalbumin <2.0 mg/dL - - - - -  Micro/Creat Ratio 0.0 - 30.0 mg/g - - - - -  Chol <200 mg/dL 181 146 - - 158  HDL > OR = 50 mg/dL  52 71 - - 54  Calc LDL mg/dL (calc) 106(H) 61 - - 84  Triglycerides <150 mg/dL 134 72 - - 102  Creatinine 0.50 - 1.05 mg/dL 1.08(H) 1.07(H) 0.90 1.00 1.21(H)   BP/Weight 01/24/2019 11/07/2018 09/20/2018 01/24/2018 02/23/2017 11/11/2016 0000000  Systolic BP [id number] 123456 A999333 Q000111Q A999333 [id number]  Diastolic BP 68 88 84 74 90 74 76  Wt. (Lbs) 140 143 135 142.04 145 - 137.75  BMI 21.93 22.4 21.14 22.25 23.4 - 22.23   Foot/eye exam completion dates 01/24/2019 07/23/2016  Foot Form Completion Done Done         Nicotine dependence Asked:confirms currently smokes cigarettes Assess: Unwilling to quit but cutting back Advise: needs to QUIT to reduce risk of cancer, cardio and cerebrovascular disease Assist: counseled for 5 minutes and literature provided Arrange: follow up in 3 months   Depression, major, single episode, in partial remission ([place]) Not suicidal or homicidal, unwilling to engage in therapy. Increase dose of fluoxetine and re eval

## 2019-01-26 DIAGNOSIS — I10 Essential (primary) hypertension: Secondary | ICD-10-CM | POA: Diagnosis not present

## 2019-01-27 DIAGNOSIS — I10 Essential (primary) hypertension: Secondary | ICD-10-CM | POA: Diagnosis not present

## 2019-01-30 DIAGNOSIS — Z5181 Encounter for therapeutic drug level monitoring: Secondary | ICD-10-CM | POA: Diagnosis not present

## 2019-01-30 DIAGNOSIS — I10 Essential (primary) hypertension: Secondary | ICD-10-CM | POA: Diagnosis not present

## 2019-01-31 DIAGNOSIS — I10 Essential (primary) hypertension: Secondary | ICD-10-CM | POA: Diagnosis not present

## 2019-02-01 DIAGNOSIS — I10 Essential (primary) hypertension: Secondary | ICD-10-CM | POA: Diagnosis not present

## 2019-02-02 DIAGNOSIS — I10 Essential (primary) hypertension: Secondary | ICD-10-CM | POA: Diagnosis not present

## 2019-02-03 DIAGNOSIS — I10 Essential (primary) hypertension: Secondary | ICD-10-CM | POA: Diagnosis not present

## 2019-02-06 DIAGNOSIS — I10 Essential (primary) hypertension: Secondary | ICD-10-CM | POA: Diagnosis not present

## 2019-02-06 DIAGNOSIS — Z5181 Encounter for therapeutic drug level monitoring: Secondary | ICD-10-CM | POA: Diagnosis not present

## 2019-02-07 DIAGNOSIS — I10 Essential (primary) hypertension: Secondary | ICD-10-CM | POA: Diagnosis not present

## 2019-02-08 DIAGNOSIS — I10 Essential (primary) hypertension: Secondary | ICD-10-CM | POA: Diagnosis not present

## 2019-02-09 DIAGNOSIS — I10 Essential (primary) hypertension: Secondary | ICD-10-CM | POA: Diagnosis not present

## 2019-02-10 DIAGNOSIS — I10 Essential (primary) hypertension: Secondary | ICD-10-CM | POA: Diagnosis not present

## 2019-02-13 DIAGNOSIS — I10 Essential (primary) hypertension: Secondary | ICD-10-CM | POA: Diagnosis not present

## 2019-02-13 DIAGNOSIS — Z5181 Encounter for therapeutic drug level monitoring: Secondary | ICD-10-CM | POA: Diagnosis not present

## 2019-02-14 DIAGNOSIS — I10 Essential (primary) hypertension: Secondary | ICD-10-CM | POA: Diagnosis not present

## 2019-02-15 DIAGNOSIS — I10 Essential (primary) hypertension: Secondary | ICD-10-CM | POA: Diagnosis not present

## 2019-02-16 DIAGNOSIS — I10 Essential (primary) hypertension: Secondary | ICD-10-CM | POA: Diagnosis not present

## 2019-02-17 DIAGNOSIS — I10 Essential (primary) hypertension: Secondary | ICD-10-CM | POA: Diagnosis not present

## 2019-02-20 DIAGNOSIS — I10 Essential (primary) hypertension: Secondary | ICD-10-CM | POA: Diagnosis not present

## 2019-02-20 DIAGNOSIS — Z5181 Encounter for therapeutic drug level monitoring: Secondary | ICD-10-CM | POA: Diagnosis not present

## 2019-02-21 DIAGNOSIS — I10 Essential (primary) hypertension: Secondary | ICD-10-CM | POA: Diagnosis not present

## 2019-02-22 DIAGNOSIS — I10 Essential (primary) hypertension: Secondary | ICD-10-CM | POA: Diagnosis not present

## 2019-02-23 DIAGNOSIS — I10 Essential (primary) hypertension: Secondary | ICD-10-CM | POA: Diagnosis not present

## 2019-02-24 DIAGNOSIS — I10 Essential (primary) hypertension: Secondary | ICD-10-CM | POA: Diagnosis not present

## 2019-02-27 DIAGNOSIS — I10 Essential (primary) hypertension: Secondary | ICD-10-CM | POA: Diagnosis not present

## 2019-02-28 DIAGNOSIS — I10 Essential (primary) hypertension: Secondary | ICD-10-CM | POA: Diagnosis not present

## 2019-03-01 DIAGNOSIS — I10 Essential (primary) hypertension: Secondary | ICD-10-CM | POA: Diagnosis not present

## 2019-03-02 DIAGNOSIS — I10 Essential (primary) hypertension: Secondary | ICD-10-CM | POA: Diagnosis not present

## 2019-03-03 DIAGNOSIS — I10 Essential (primary) hypertension: Secondary | ICD-10-CM | POA: Diagnosis not present

## 2019-03-06 DIAGNOSIS — I10 Essential (primary) hypertension: Secondary | ICD-10-CM | POA: Diagnosis not present

## 2019-03-07 DIAGNOSIS — I10 Essential (primary) hypertension: Secondary | ICD-10-CM | POA: Diagnosis not present

## 2019-03-08 DIAGNOSIS — I10 Essential (primary) hypertension: Secondary | ICD-10-CM | POA: Diagnosis not present

## 2019-03-09 DIAGNOSIS — I10 Essential (primary) hypertension: Secondary | ICD-10-CM | POA: Diagnosis not present

## 2019-03-10 DIAGNOSIS — I10 Essential (primary) hypertension: Secondary | ICD-10-CM | POA: Diagnosis not present

## 2019-03-13 DIAGNOSIS — I10 Essential (primary) hypertension: Secondary | ICD-10-CM | POA: Diagnosis not present

## 2019-03-14 DIAGNOSIS — I10 Essential (primary) hypertension: Secondary | ICD-10-CM | POA: Diagnosis not present

## 2019-03-15 DIAGNOSIS — I10 Essential (primary) hypertension: Secondary | ICD-10-CM | POA: Diagnosis not present

## 2019-03-16 DIAGNOSIS — I10 Essential (primary) hypertension: Secondary | ICD-10-CM | POA: Diagnosis not present

## 2019-03-17 DIAGNOSIS — I10 Essential (primary) hypertension: Secondary | ICD-10-CM | POA: Diagnosis not present

## 2019-03-20 DIAGNOSIS — Z5181 Encounter for therapeutic drug level monitoring: Secondary | ICD-10-CM | POA: Diagnosis not present

## 2019-03-20 DIAGNOSIS — I10 Essential (primary) hypertension: Secondary | ICD-10-CM | POA: Diagnosis not present

## 2019-03-21 DIAGNOSIS — I10 Essential (primary) hypertension: Secondary | ICD-10-CM | POA: Diagnosis not present

## 2019-03-22 DIAGNOSIS — I10 Essential (primary) hypertension: Secondary | ICD-10-CM | POA: Diagnosis not present

## 2019-03-23 DIAGNOSIS — I10 Essential (primary) hypertension: Secondary | ICD-10-CM | POA: Diagnosis not present

## 2019-03-24 DIAGNOSIS — I10 Essential (primary) hypertension: Secondary | ICD-10-CM | POA: Diagnosis not present

## 2019-03-27 DIAGNOSIS — I10 Essential (primary) hypertension: Secondary | ICD-10-CM | POA: Diagnosis not present

## 2019-03-28 DIAGNOSIS — Z13220 Encounter for screening for lipoid disorders: Secondary | ICD-10-CM | POA: Diagnosis not present

## 2019-03-28 DIAGNOSIS — I10 Essential (primary) hypertension: Secondary | ICD-10-CM | POA: Diagnosis not present

## 2019-03-28 DIAGNOSIS — E1169 Type 2 diabetes mellitus with other specified complication: Secondary | ICD-10-CM | POA: Diagnosis not present

## 2019-03-29 DIAGNOSIS — I10 Essential (primary) hypertension: Secondary | ICD-10-CM | POA: Diagnosis not present

## 2019-03-29 LAB — LIPID PANEL
Cholesterol: 211 mg/dL — ABNORMAL HIGH (ref <200)
HDL: 44 mg/dL — ABNORMAL LOW (ref >=50)
LDL Cholesterol (Calc): 136 mg/dL (calc) — ABNORMAL HIGH
Non-HDL Cholesterol (Calc): 167 mg/dL (calc) — ABNORMAL HIGH (ref <130)
Total CHOL/HDL Ratio: 4.8 (calc) (ref <5.0)
Triglycerides: 179 mg/dL — ABNORMAL HIGH (ref <150)

## 2019-03-29 LAB — COMPLETE METABOLIC PANEL WITH GFR
AG Ratio: 1.3 (calc) (ref 1.0–2.5)
ALT: 20 U/L (ref 6–29)
AST: 22 U/L (ref 10–35)
Albumin: 3.5 g/dL — ABNORMAL LOW (ref 3.6–5.1)
Alkaline phosphatase (APISO): 96 U/L (ref 37–153)
BUN/Creatinine Ratio: 17 (calc) (ref 6–22)
BUN: 26 mg/dL — ABNORMAL HIGH (ref 7–25)
CO2: 24 mmol/L (ref 20–32)
Calcium: 8.9 mg/dL (ref 8.6–10.4)
Chloride: 109 mmol/L (ref 98–110)
Creat: 1.49 mg/dL — ABNORMAL HIGH (ref 0.50–1.05)
GFR, Est African American: 45 mL/min/{1.73_m2} — ABNORMAL LOW (ref >=60)
GFR, Est Non African American: 39 mL/min/{1.73_m2} — ABNORMAL LOW (ref >=60)
Globulin: 2.8 g/dL (calc) (ref 1.9–3.7)
Glucose, Bld: 157 mg/dL — ABNORMAL HIGH (ref 65–99)
Potassium: 4 mmol/L (ref 3.5–5.3)
Sodium: 142 mmol/L (ref 135–146)
Total Bilirubin: 0.4 mg/dL (ref 0.2–1.2)
Total Protein: 6.3 g/dL (ref 6.1–8.1)

## 2019-03-29 LAB — HEMOGLOBIN A1C
Hgb A1c MFr Bld: 7.4 % of total Hgb — ABNORMAL HIGH (ref <5.7)
Mean Plasma Glucose: 166 (calc)
eAG (mmol/L): 9.2 (calc)

## 2019-03-29 LAB — MICROALBUMIN, URINE: Microalb, Ur: >600 mg/dL

## 2019-03-30 DIAGNOSIS — I10 Essential (primary) hypertension: Secondary | ICD-10-CM | POA: Diagnosis not present

## 2019-03-31 DIAGNOSIS — I10 Essential (primary) hypertension: Secondary | ICD-10-CM | POA: Diagnosis not present

## 2019-04-03 DIAGNOSIS — I10 Essential (primary) hypertension: Secondary | ICD-10-CM | POA: Diagnosis not present

## 2019-04-04 ENCOUNTER — Encounter: Payer: Self-pay | Admitting: Family Medicine

## 2019-04-04 ENCOUNTER — Ambulatory Visit (INDEPENDENT_AMBULATORY_CARE_PROVIDER_SITE_OTHER): Payer: Medicaid Other | Admitting: Family Medicine

## 2019-04-04 ENCOUNTER — Other Ambulatory Visit: Payer: Self-pay

## 2019-04-04 VITALS — BP 130/68 | Ht 67.0 in | Wt 140.0 lb

## 2019-04-04 DIAGNOSIS — F324 Major depressive disorder, single episode, in partial remission: Secondary | ICD-10-CM

## 2019-04-04 DIAGNOSIS — Z716 Tobacco abuse counseling: Secondary | ICD-10-CM | POA: Diagnosis not present

## 2019-04-04 DIAGNOSIS — I10 Essential (primary) hypertension: Secondary | ICD-10-CM

## 2019-04-04 DIAGNOSIS — E1140 Type 2 diabetes mellitus with diabetic neuropathy, unspecified: Secondary | ICD-10-CM | POA: Diagnosis not present

## 2019-04-04 DIAGNOSIS — F17210 Nicotine dependence, cigarettes, uncomplicated: Secondary | ICD-10-CM | POA: Diagnosis not present

## 2019-04-04 DIAGNOSIS — F17218 Nicotine dependence, cigarettes, with other nicotine-induced disorders: Secondary | ICD-10-CM

## 2019-04-04 DIAGNOSIS — E559 Vitamin D deficiency, unspecified: Secondary | ICD-10-CM

## 2019-04-04 MED ORDER — GLIPIZIDE ER 5 MG PO TB24: 5.0000 mg | ORAL_TABLET | Freq: Every day | ORAL | 3 refills | Status: DC

## 2019-04-04 MED ORDER — ASPIRIN EC 81 MG PO TBEC: 81.0000 mg | DELAYED_RELEASE_TABLET | Freq: Every day | ORAL | 3 refills | Status: DC

## 2019-04-04 MED ORDER — ROSUVASTATIN CALCIUM 5 MG PO TABS: 5.0000 mg | ORAL_TABLET | Freq: Every day | ORAL | 3 refills | Status: DC

## 2019-04-04 NOTE — Patient Instructions (Addendum)
F/U in office with MD last week in April, call if you need me sooner  Please stop metformin, new for diabetes is glipizide one daily  Cholesterol is high, new is crestor one daily, also reduce fried and fatty foods  After you finish this vitD capsule NO MORE till blood re checked, since you report taking one daily which is incorrect  Please get fasting lipid, cmp and EGFr , hBA1C, vitamin D one week before next visit  Commit to stopping smoking to protect your health  Thanks for choosing [facility], we consider it a privelige to serve you.

## 2019-04-04 NOTE — Progress Notes (Signed)
Virtual Visit via Telephone Note  I connected with Latoya Walsh on 04/04/19 at 10:40 AM EST by telephone and verified that I am speaking with the correct person using two identifiers.  Location: Patient: home Provider: office   I discussed the limitations, risks, security and privacy concerns of performing an evaluation and management service by telephone and the availability of in person appointments. I also discussed with the patient that there may be a patient responsible charge related to this service. The patient expressed understanding and agreed to proceed.   History of Present Illness: F/U chronic problems, medication review, and refill medication when necessary. Review most recent labs and order labs which are due Review preventive health and update with necessary referrals or immunizations as indicated Denies recent fever or chills. Denies sinus pressure, nasal congestion, ear pain or sore throat. Denies chest congestion, productive cough or wheezing. Denies chest pains, palpitations and leg swelling Denies abdominal pain, nausea, vomiting,diarrhea or constipation.   Denies dysuria, frequency, hesitancy or incontinence. Denies joint pain, swelling and limitation in mobility. Denies headaches, seizures, numbness, or tingling. C/o  depression, anxiety or insomnia. Denies skin break down or rash.       Observations/Objective: BP 130/68   Ht 5\' 7"  (1.702 m)   Wt 140 lb (63.5 kg)   LMP 06/23/2010   BMI 21.93 kg/m  Good communication  Alert and oriented x 3 No signs of respiratory distress during speech    Assessment and Plan:  HTN, goal below 130/80 .conb1 DASH diet and commitment to daily physical activity for a minimum of 30 minutes discussed and encouraged, as a part of hypertension management. The importance of attaining a healthy weight is also discussed.  BP/Weight 04/04/2019 01/24/2019 11/07/2018 09/20/2018 01/24/2018 02/23/2017 [id number]  Systolic BP [id number]  [id number] A999333 123456 A999333 Q000111Q A999333  Diastolic BP 68 68 88 84 74 90 74  Wt. (Lbs) 140 140 143 135 142.04 145 -  BMI 21.93 21.93 22.4 21.14 22.25 23.4 -       Nicotine dependence Asked:confirms currently smokes cigarettes Assess: Unwilling to quit but cutting back Advise: needs to QUIT to reduce risk of cancer, cardio and cerebrovascular disease Assist: counseled for 5 minutes and literature provided Arrange: follow up in 3 months   Depression, major, single episode, in partial remission ([place]) Re commitment t to taking medication every day discussed  Controlled type 2 diabetes with neuropathy ([place]) Ms. Stonehouse is reminded of the importance of commitment to daily physical activity for 30 minutes or more, as able and the need to limit carbohydrate intake to 30 to 60 grams per meal to help with blood sugar control.   The need to take medication as prescribed, test blood sugar as directed, and to call between visits if there is a concern that blood sugar is uncontrolled is also discussed.   Ms. Trachtman is reminded of the importance of daily foot exam, annual eye examination, and good blood sugar, blood pressure and cholesterol control. CKD, change to glipizide daily and d/c metformin  Diabetic Labs Latest Ref Rng & Units 03/28/2019 09/27/2018 10/02/2016 02/11/2016 08/16/2015  HbA1c <5.7 % of total Hgb 7.4(H) 7.4(H) 6.4(H) 6.4(H) 7.4(H)  Microalbumin mg/dL >600.0 - - - -  Micro/Creat Ratio 0.0 - 30.0 mg/g - - - - -  Chol <200 mg/dL 211(H) 181 146 - -  HDL > OR = 50 mg/dL 44(L) 52 71 - -  Calc LDL mg/dL (calc) 136(H) 106(H) 61 - -  Triglycerides <150 mg/dL  179(H) 134 72 - -  Creatinine 0.50 - 1.05 mg/dL 1.49(H) 1.08(H) 1.07(H) 0.90 1.00   BP/Weight 04/04/2019 01/24/2019 11/07/2018 09/20/2018 01/24/2018 02/23/2017 [id number]  Systolic BP [id number] A999333 123456 A999333 Q000111Q A999333  Diastolic BP 68 68 88 84 74 90 74  Wt. (Lbs) 140 140 143 135 142.04 145 -  BMI 21.93 21.93 22.4 21.14 22.25 23.4 -   Foot/eye exam completion  dates 01/24/2019 07/23/2016  Foot Form Completion Done Done         Follow Up Instructions:    I discussed the assessment and treatment plan with the patient. The patient was provided an opportunity to ask questions and all were answered. The patient agreed with the plan and demonstrated an understanding of the instructions.   The patient was advised to call back or seek an in-person evaluation if the symptoms worsen or if the condition fails to improve as anticipated.  I provided 20 minutes of non-face-to-face time during this encounter.   Tula Nakayama, MD

## 2019-04-05 DIAGNOSIS — I10 Essential (primary) hypertension: Secondary | ICD-10-CM | POA: Diagnosis not present

## 2019-04-06 DIAGNOSIS — I10 Essential (primary) hypertension: Secondary | ICD-10-CM | POA: Diagnosis not present

## 2019-04-07 DIAGNOSIS — I10 Essential (primary) hypertension: Secondary | ICD-10-CM | POA: Diagnosis not present

## 2019-04-10 DIAGNOSIS — I10 Essential (primary) hypertension: Secondary | ICD-10-CM | POA: Diagnosis not present

## 2019-04-11 DIAGNOSIS — I10 Essential (primary) hypertension: Secondary | ICD-10-CM | POA: Diagnosis not present

## 2019-04-12 DIAGNOSIS — I10 Essential (primary) hypertension: Secondary | ICD-10-CM | POA: Diagnosis not present

## 2019-04-13 DIAGNOSIS — I10 Essential (primary) hypertension: Secondary | ICD-10-CM | POA: Diagnosis not present

## 2019-04-14 DIAGNOSIS — I10 Essential (primary) hypertension: Secondary | ICD-10-CM | POA: Diagnosis not present

## 2019-04-15 ENCOUNTER — Encounter: Payer: Self-pay | Admitting: Family Medicine

## 2019-04-15 NOTE — Assessment & Plan Note (Signed)
Asked:confirms currently smokes cigarettes Assess: Unwilling to quit but cutting back Advise: needs to QUIT to reduce risk of cancer, cardio and cerebrovascular disease Assist: counseled for 5 minutes and literature provided Arrange: follow up in 3 months  

## 2019-04-15 NOTE — Assessment & Plan Note (Signed)
Latoya Walsh is reminded of the importance of commitment to daily physical activity for 30 minutes or more, as able and the need to limit carbohydrate intake to 30 to 60 grams per meal to help with blood sugar control.   The need to take medication as prescribed, test blood sugar as directed, and to call between visits if there is a concern that blood sugar is uncontrolled is also discussed.   Latoya Walsh is reminded of the importance of daily foot exam, annual eye examination, and good blood sugar, blood pressure and cholesterol control. CKD, change to glipizide daily and d/c metformin  Diabetic Labs Latest Ref Rng & Units 03/28/2019 09/27/2018 10/02/2016 02/11/2016 08/16/2015  HbA1c <5.7 % of total Hgb 7.4(H) 7.4(H) 6.4(H) 6.4(H) 7.4(H)  Microalbumin mg/dL >600.0 - - - -  Micro/Creat Ratio 0.0 - 30.0 mg/g - - - - -  Chol <200 mg/dL 211(H) 181 146 - -  HDL > OR = 50 mg/dL 44(L) 52 71 - -  Calc LDL mg/dL (calc) 136(H) 106(H) 61 - -  Triglycerides <150 mg/dL 179(H) 134 72 - -  Creatinine 0.50 - 1.05 mg/dL 1.49(H) 1.08(H) 1.07(H) 0.90 1.00   BP/Weight 04/04/2019 01/24/2019 11/07/2018 09/20/2018 01/24/2018 02/23/2017 [id number]  Systolic BP [id number] A999333 123456 A999333 Q000111Q A999333  Diastolic BP 68 68 88 84 74 90 74  Wt. (Lbs) 140 140 143 135 142.04 145 -  BMI 21.93 21.93 22.4 21.14 22.25 23.4 -   Foot/eye exam completion dates 01/24/2019 07/23/2016  Foot Form Completion Done Done

## 2019-04-15 NOTE — Assessment & Plan Note (Signed)
Re commitment t to taking medication every day discussed

## 2019-04-15 NOTE — Assessment & Plan Note (Signed)
.  conb1 DASH diet and commitment to daily physical activity for a minimum of 30 minutes discussed and encouraged, as a part of hypertension management. The importance of attaining a healthy weight is also discussed.  BP/Weight 04/04/2019 01/24/2019 11/07/2018 09/20/2018 01/24/2018 02/23/2017 [id number]  Systolic BP [id number] A999333 123456 A999333 Q000111Q A999333  Diastolic BP 68 68 88 84 74 90 74  Wt. (Lbs) 140 140 143 135 142.04 145 -  BMI 21.93 21.93 22.4 21.14 22.25 23.4 -

## 2019-04-17 DIAGNOSIS — I10 Essential (primary) hypertension: Secondary | ICD-10-CM | POA: Diagnosis not present

## 2019-04-18 DIAGNOSIS — I10 Essential (primary) hypertension: Secondary | ICD-10-CM | POA: Diagnosis not present

## 2019-04-19 DIAGNOSIS — I10 Essential (primary) hypertension: Secondary | ICD-10-CM | POA: Diagnosis not present

## 2019-04-20 DIAGNOSIS — I10 Essential (primary) hypertension: Secondary | ICD-10-CM | POA: Diagnosis not present

## 2019-04-21 DIAGNOSIS — I10 Essential (primary) hypertension: Secondary | ICD-10-CM | POA: Diagnosis not present

## 2019-04-24 DIAGNOSIS — I10 Essential (primary) hypertension: Secondary | ICD-10-CM | POA: Diagnosis not present

## 2019-04-25 DIAGNOSIS — I10 Essential (primary) hypertension: Secondary | ICD-10-CM | POA: Diagnosis not present

## 2019-04-26 DIAGNOSIS — I10 Essential (primary) hypertension: Secondary | ICD-10-CM | POA: Diagnosis not present

## 2019-04-27 DIAGNOSIS — I10 Essential (primary) hypertension: Secondary | ICD-10-CM | POA: Diagnosis not present

## 2019-04-28 DIAGNOSIS — I10 Essential (primary) hypertension: Secondary | ICD-10-CM | POA: Diagnosis not present

## 2019-05-01 DIAGNOSIS — I10 Essential (primary) hypertension: Secondary | ICD-10-CM | POA: Diagnosis not present

## 2019-05-02 DIAGNOSIS — I10 Essential (primary) hypertension: Secondary | ICD-10-CM | POA: Diagnosis not present

## 2019-05-03 DIAGNOSIS — I10 Essential (primary) hypertension: Secondary | ICD-10-CM | POA: Diagnosis not present

## 2019-05-04 DIAGNOSIS — I10 Essential (primary) hypertension: Secondary | ICD-10-CM | POA: Diagnosis not present

## 2019-05-05 DIAGNOSIS — I10 Essential (primary) hypertension: Secondary | ICD-10-CM | POA: Diagnosis not present

## 2019-05-08 DIAGNOSIS — I10 Essential (primary) hypertension: Secondary | ICD-10-CM | POA: Diagnosis not present

## 2019-05-09 DIAGNOSIS — I10 Essential (primary) hypertension: Secondary | ICD-10-CM | POA: Diagnosis not present

## 2019-05-10 DIAGNOSIS — I10 Essential (primary) hypertension: Secondary | ICD-10-CM | POA: Diagnosis not present

## 2019-05-10 DIAGNOSIS — F331 Major depressive disorder, recurrent, moderate: Secondary | ICD-10-CM | POA: Diagnosis not present

## 2019-05-11 DIAGNOSIS — I10 Essential (primary) hypertension: Secondary | ICD-10-CM | POA: Diagnosis not present

## 2019-05-12 DIAGNOSIS — I10 Essential (primary) hypertension: Secondary | ICD-10-CM | POA: Diagnosis not present

## 2019-05-15 DIAGNOSIS — I10 Essential (primary) hypertension: Secondary | ICD-10-CM | POA: Diagnosis not present

## 2019-05-16 DIAGNOSIS — I10 Essential (primary) hypertension: Secondary | ICD-10-CM | POA: Diagnosis not present

## 2019-05-17 DIAGNOSIS — I10 Essential (primary) hypertension: Secondary | ICD-10-CM | POA: Diagnosis not present

## 2019-05-18 DIAGNOSIS — I10 Essential (primary) hypertension: Secondary | ICD-10-CM | POA: Diagnosis not present

## 2019-05-19 DIAGNOSIS — I10 Essential (primary) hypertension: Secondary | ICD-10-CM | POA: Diagnosis not present

## 2019-05-22 DIAGNOSIS — I10 Essential (primary) hypertension: Secondary | ICD-10-CM | POA: Diagnosis not present

## 2019-05-23 DIAGNOSIS — I10 Essential (primary) hypertension: Secondary | ICD-10-CM | POA: Diagnosis not present

## 2019-05-24 DIAGNOSIS — I10 Essential (primary) hypertension: Secondary | ICD-10-CM | POA: Diagnosis not present

## 2019-05-25 DIAGNOSIS — I10 Essential (primary) hypertension: Secondary | ICD-10-CM | POA: Diagnosis not present

## 2019-05-26 DIAGNOSIS — I10 Essential (primary) hypertension: Secondary | ICD-10-CM | POA: Diagnosis not present

## 2019-05-29 DIAGNOSIS — I10 Essential (primary) hypertension: Secondary | ICD-10-CM | POA: Diagnosis not present

## 2019-05-30 DIAGNOSIS — I10 Essential (primary) hypertension: Secondary | ICD-10-CM | POA: Diagnosis not present

## 2019-05-31 DIAGNOSIS — I10 Essential (primary) hypertension: Secondary | ICD-10-CM | POA: Diagnosis not present

## 2019-06-01 DIAGNOSIS — I10 Essential (primary) hypertension: Secondary | ICD-10-CM | POA: Diagnosis not present

## 2019-06-02 DIAGNOSIS — I10 Essential (primary) hypertension: Secondary | ICD-10-CM | POA: Diagnosis not present

## 2019-06-12 DIAGNOSIS — I10 Essential (primary) hypertension: Secondary | ICD-10-CM | POA: Diagnosis not present

## 2019-06-13 DIAGNOSIS — I10 Essential (primary) hypertension: Secondary | ICD-10-CM | POA: Diagnosis not present

## 2019-06-14 DIAGNOSIS — I10 Essential (primary) hypertension: Secondary | ICD-10-CM | POA: Diagnosis not present

## 2019-06-15 DIAGNOSIS — I10 Essential (primary) hypertension: Secondary | ICD-10-CM | POA: Diagnosis not present

## 2019-06-16 DIAGNOSIS — I10 Essential (primary) hypertension: Secondary | ICD-10-CM | POA: Diagnosis not present

## 2019-06-19 DIAGNOSIS — I10 Essential (primary) hypertension: Secondary | ICD-10-CM | POA: Diagnosis not present

## 2019-06-20 DIAGNOSIS — I10 Essential (primary) hypertension: Secondary | ICD-10-CM | POA: Diagnosis not present

## 2019-06-21 DIAGNOSIS — I10 Essential (primary) hypertension: Secondary | ICD-10-CM | POA: Diagnosis not present

## 2019-06-22 DIAGNOSIS — I10 Essential (primary) hypertension: Secondary | ICD-10-CM | POA: Diagnosis not present

## 2019-06-23 DIAGNOSIS — I10 Essential (primary) hypertension: Secondary | ICD-10-CM | POA: Diagnosis not present

## 2019-07-17 DIAGNOSIS — I10 Essential (primary) hypertension: Secondary | ICD-10-CM | POA: Diagnosis not present

## 2019-07-18 DIAGNOSIS — I10 Essential (primary) hypertension: Secondary | ICD-10-CM | POA: Diagnosis not present

## 2019-07-19 DIAGNOSIS — I10 Essential (primary) hypertension: Secondary | ICD-10-CM | POA: Diagnosis not present

## 2019-07-20 DIAGNOSIS — I10 Essential (primary) hypertension: Secondary | ICD-10-CM | POA: Diagnosis not present

## 2019-07-21 DIAGNOSIS — I10 Essential (primary) hypertension: Secondary | ICD-10-CM | POA: Diagnosis not present

## 2019-07-24 DIAGNOSIS — I10 Essential (primary) hypertension: Secondary | ICD-10-CM | POA: Diagnosis not present

## 2019-07-25 DIAGNOSIS — I10 Essential (primary) hypertension: Secondary | ICD-10-CM | POA: Diagnosis not present

## 2019-07-26 DIAGNOSIS — I10 Essential (primary) hypertension: Secondary | ICD-10-CM | POA: Diagnosis not present

## 2019-07-27 DIAGNOSIS — I10 Essential (primary) hypertension: Secondary | ICD-10-CM | POA: Diagnosis not present

## 2019-07-28 DIAGNOSIS — I10 Essential (primary) hypertension: Secondary | ICD-10-CM | POA: Diagnosis not present

## 2019-07-31 DIAGNOSIS — I10 Essential (primary) hypertension: Secondary | ICD-10-CM | POA: Diagnosis not present

## 2019-08-01 DIAGNOSIS — I10 Essential (primary) hypertension: Secondary | ICD-10-CM | POA: Diagnosis not present

## 2019-08-02 DIAGNOSIS — I10 Essential (primary) hypertension: Secondary | ICD-10-CM | POA: Diagnosis not present

## 2019-08-03 DIAGNOSIS — I10 Essential (primary) hypertension: Secondary | ICD-10-CM | POA: Diagnosis not present

## 2019-08-04 DIAGNOSIS — I10 Essential (primary) hypertension: Secondary | ICD-10-CM | POA: Diagnosis not present

## 2019-08-07 DIAGNOSIS — I10 Essential (primary) hypertension: Secondary | ICD-10-CM | POA: Diagnosis not present

## 2019-08-08 DIAGNOSIS — I10 Essential (primary) hypertension: Secondary | ICD-10-CM | POA: Diagnosis not present

## 2019-08-09 DIAGNOSIS — I10 Essential (primary) hypertension: Secondary | ICD-10-CM | POA: Diagnosis not present

## 2019-08-10 DIAGNOSIS — I10 Essential (primary) hypertension: Secondary | ICD-10-CM | POA: Diagnosis not present

## 2019-08-11 DIAGNOSIS — I10 Essential (primary) hypertension: Secondary | ICD-10-CM | POA: Diagnosis not present

## 2019-08-14 ENCOUNTER — Ambulatory Visit: Payer: Medicaid Other | Admitting: Family Medicine

## 2019-08-14 DIAGNOSIS — I10 Essential (primary) hypertension: Secondary | ICD-10-CM | POA: Diagnosis not present

## 2019-08-15 DIAGNOSIS — I10 Essential (primary) hypertension: Secondary | ICD-10-CM | POA: Diagnosis not present

## 2019-08-16 ENCOUNTER — Ambulatory Visit: Payer: Medicaid Other | Admitting: Family Medicine

## 2019-08-16 DIAGNOSIS — I10 Essential (primary) hypertension: Secondary | ICD-10-CM | POA: Diagnosis not present

## 2019-08-17 DIAGNOSIS — I10 Essential (primary) hypertension: Secondary | ICD-10-CM | POA: Diagnosis not present

## 2019-08-18 DIAGNOSIS — I10 Essential (primary) hypertension: Secondary | ICD-10-CM | POA: Diagnosis not present

## 2019-08-21 DIAGNOSIS — I10 Essential (primary) hypertension: Secondary | ICD-10-CM | POA: Diagnosis not present

## 2019-08-22 DIAGNOSIS — I10 Essential (primary) hypertension: Secondary | ICD-10-CM | POA: Diagnosis not present

## 2019-08-23 DIAGNOSIS — I10 Essential (primary) hypertension: Secondary | ICD-10-CM | POA: Diagnosis not present

## 2019-08-24 DIAGNOSIS — I10 Essential (primary) hypertension: Secondary | ICD-10-CM | POA: Diagnosis not present

## 2019-08-25 DIAGNOSIS — I10 Essential (primary) hypertension: Secondary | ICD-10-CM | POA: Diagnosis not present

## 2019-08-28 DIAGNOSIS — I10 Essential (primary) hypertension: Secondary | ICD-10-CM | POA: Diagnosis not present

## 2019-08-29 DIAGNOSIS — I10 Essential (primary) hypertension: Secondary | ICD-10-CM | POA: Diagnosis not present

## 2019-08-30 ENCOUNTER — Other Ambulatory Visit: Payer: Self-pay | Admitting: Family Medicine

## 2019-08-30 ENCOUNTER — Other Ambulatory Visit: Payer: Self-pay

## 2019-08-30 ENCOUNTER — Other Ambulatory Visit (HOSPITAL_COMMUNITY)
Admission: RE | Admit: 2019-08-30 | Discharge: 2019-08-30 | Disposition: A | Discharge status: Home / Self Care | Payer: Medicaid Other | Source: Ambulatory Visit | Attending: Family Medicine | Admitting: Family Medicine

## 2019-08-30 ENCOUNTER — Encounter: Payer: Self-pay | Admitting: Family Medicine

## 2019-08-30 ENCOUNTER — Ambulatory Visit (INDEPENDENT_AMBULATORY_CARE_PROVIDER_SITE_OTHER): Payer: Medicaid Other | Admitting: Family Medicine

## 2019-08-30 VITALS — BP 165/82 | HR 85 | Temp 98.1°F | Resp 15 | Ht 67.0 in | Wt 149.1 lb

## 2019-08-30 DIAGNOSIS — N760 Acute vaginitis: Secondary | ICD-10-CM | POA: Insufficient documentation

## 2019-08-30 DIAGNOSIS — N898 Other specified noninflammatory disorders of vagina: Secondary | ICD-10-CM | POA: Diagnosis not present

## 2019-08-30 DIAGNOSIS — I10 Essential (primary) hypertension: Secondary | ICD-10-CM | POA: Diagnosis not present

## 2019-08-30 DIAGNOSIS — E1140 Type 2 diabetes mellitus with diabetic neuropathy, unspecified: Secondary | ICD-10-CM | POA: Diagnosis not present

## 2019-08-30 DIAGNOSIS — F17218 Nicotine dependence, cigarettes, with other nicotine-induced disorders: Secondary | ICD-10-CM

## 2019-08-30 DIAGNOSIS — F1920 Other psychoactive substance dependence, uncomplicated: Secondary | ICD-10-CM

## 2019-08-30 MED ORDER — METRONIDAZOLE 500 MG PO TABS: 500.0000 mg | ORAL_TABLET | Freq: Two times a day (BID) | ORAL | 0 refills | Status: DC

## 2019-08-30 NOTE — Patient Instructions (Addendum)
Annual exam with pap in 6 weeks with MD call if you need me sooner  HBA1c in office today  You NEED to take your BP meds every day, to control your blood pressure  Medication is sent for discharge, NO alcohol or drug use while taking this  Thanks for choosing [facility], we consider it a privelige to serve you.

## 2019-08-30 NOTE — Assessment & Plan Note (Signed)
Treat with flagyll, and f/u wet prep and culture

## 2019-08-30 NOTE — Assessment & Plan Note (Signed)
Uncontrolled, states out of medication, needs to collect same DASH diet and commitment to daily physical activity for a minimum of 30 minutes discussed and encouraged, as a part of hypertension management. The importance of attaining a healthy weight is also discussed.  BP/Weight 08/30/2019 04/04/2019 01/24/2019 11/07/2018 09/20/2018 01/24/2018 36/10/5990  Systolic BP 341 443 601 658 006 349 494  Diastolic BP 82 68 68 88 84 74 90  Wt. (Lbs) 149.12 140 140 143 135 142.04 145  BMI 23.36 21.93 21.93 22.4 21.14 22.25 23.4

## 2019-08-30 NOTE — Assessment & Plan Note (Addendum)
Updated lab needed, test at visit is 6.4, so improved and controlled Latoya Walsh is reminded of the importance of commitment to daily physical activity for 30 minutes or more, as able and the need to limit carbohydrate intake to 30 to 60 grams per meal to help with blood sugar control.   The need to take medication as prescribed, test blood sugar as directed, and to call between visits if there is a concern that blood sugar is uncontrolled is also discussed.   Latoya Walsh is reminded of the importance of daily foot exam, annual eye examination, and good blood sugar, blood pressure and cholesterol control. No changed in medication  Diabetic Labs Latest Ref Rng & Units 03/28/2019 09/27/2018 10/02/2016 02/11/2016 08/16/2015  HbA1c <5.7 % of total Hgb 7.4(H) 7.4(H) 6.4(H) 6.4(H) 7.4(H)  Microalbumin mg/dL >600.0 - - - -  Micro/Creat Ratio 0.0 - 30.0 mg/g - - - - -  Chol <200 mg/dL 211(H) 181 146 - -  HDL > OR = 50 mg/dL 44(L) 52 71 - -  Calc LDL mg/dL (calc) 136(H) 106(H) 61 - -  Triglycerides <150 mg/dL 179(H) 134 72 - -  Creatinine 0.50 - 1.05 mg/dL 1.49(H) 1.08(H) 1.07(H) 0.90 1.00   BP/Weight 08/30/2019 04/04/2019 01/24/2019 11/07/2018 09/20/2018 01/24/2018 01/0/0712  Systolic BP 197 588 325 498 264 158 309  Diastolic BP 82 68 68 88 84 74 90  Wt. (Lbs) 149.12 140 140 143 135 142.04 145  BMI 23.36 21.93 21.93 22.4 21.14 22.25 23.4   Foot/eye exam completion dates 01/24/2019 07/23/2016  Foot Form Completion Done Done   No change in medication

## 2019-08-31 DIAGNOSIS — I10 Essential (primary) hypertension: Secondary | ICD-10-CM | POA: Diagnosis not present

## 2019-09-01 DIAGNOSIS — I10 Essential (primary) hypertension: Secondary | ICD-10-CM | POA: Diagnosis not present

## 2019-09-03 ENCOUNTER — Encounter: Payer: Self-pay | Admitting: Family Medicine

## 2019-09-03 ENCOUNTER — Telehealth: Payer: Self-pay | Admitting: Family Medicine

## 2019-09-03 NOTE — Assessment & Plan Note (Signed)
Denies illicit drug use

## 2019-09-03 NOTE — Progress Notes (Signed)
Latoya Walsh     MRN: [id number]      DOB: 18-Jan-1961   HPI Latoya Walsh is here for follow up and re-evaluation of chronic medical conditions, medication management and review of any available recent lab and radiology data.  C/o foul,smelling vaginal discharge x 1 week, denies fever or chills C/o leg swelling   ROS Denies recent fever or chills. Denies sinus pressure, nasal congestion, ear pain or sore throat. Denies chest congestion, productive cough or wheezing. Denies chest pains, palpitations, pND  Or orthopnea Denies abdominal pain, nausea, vomiting,diarrhea or constipation.   Denies dysuria, frequency, C/o   joint pain, swelling and limitation in mobility. Denies headaches, seizures, numbness, or tingling. Denies depression, anxiety or insomnia. Denies skin break down or rash.   PE  BP (!) 165/82   Pulse 85   Temp 98.1 F (36.7 C) (Temporal)   Resp 15   Ht 5\' 7"  (1.702 m)   Wt 149 lb 1.9 oz (67.6 kg)   LMP 06/23/2010   SpO2 96%   BMI 23.36 kg/m   Patient alert and oriented and in no cardiopulmonary distress.  HEENT: No facial asymmetry, EOMI,     Neck supple .  Chest: Clear to auscultation bilaterally.Reduced air entry  CVS: S1, S2 no murmurs, no S3.Regular rate.  ABD: Soft non tender.   Ext: No edema  MS: Adequate ROM spine, shoulders, hips and knees.  Skin: Intact, no ulcerations or rash noted.  Psych: , normal affect. Memory intact not anxious or depressed appearing.  CNS: CN 2-12 intact, power,  normal throughout.no focal deficits noted.   Assessment & Plan  Controlled type 2 diabetes with neuropathy ([place]) Updated lab needed at/ before next visit. Latoya Walsh is reminded of the importance of commitment to daily physical activity for 30 minutes or more, as able and the need to limit carbohydrate intake to 30 to 60 grams per meal to help with blood sugar control.   The need to take medication as prescribed, test blood sugar as directed, and  to call between visits if there is a concern that blood sugar is uncontrolled is also discussed.   Latoya Walsh is reminded of the importance of daily foot exam, annual eye examination, and good blood sugar, blood pressure and cholesterol control.  Diabetic Labs Latest Ref Rng & Units 03/28/2019 09/27/2018 10/02/2016 02/11/2016 08/16/2015  HbA1c <5.7 % of total Hgb 7.4(H) 7.4(H) 6.4(H) 6.4(H) 7.4(H)  Microalbumin mg/dL >600.0 - - - -  Micro/Creat Ratio 0.0 - 30.0 mg/g - - - - -  Chol <200 mg/dL 211(H) 181 146 - -  HDL > OR = 50 mg/dL 44(L) 52 71 - -  Calc LDL mg/dL (calc) 136(H) 106(H) 61 - -  Triglycerides <150 mg/dL 179(H) 134 72 - -  Creatinine 0.50 - 1.05 mg/dL 1.49(H) 1.08(H) 1.07(H) 0.90 1.00   BP/Weight 08/30/2019 04/04/2019 01/24/2019 11/07/2018 09/20/2018 01/24/2018 11/0/3159  Systolic BP 458 592 924 462 863 817 711  Diastolic BP 82 68 68 88 84 74 90  Wt. (Lbs) 149.12 140 140 143 135 142.04 145  BMI 23.36 21.93 21.93 22.4 21.14 22.25 23.4   Foot/eye exam completion dates 01/24/2019 07/23/2016  Foot Form Completion Done Done        HTN, goal below 130/80 Uncontrolled, states out of medication, needs to collect same DASH diet and commitment to daily physical activity for a minimum of 30 minutes discussed and encouraged, as a part of hypertension management. The importance of attaining  a healthy weight is also discussed.  BP/Weight 08/30/2019 04/04/2019 01/24/2019 11/07/2018 09/20/2018 01/24/2018 96/09/5914  Systolic BP 384 665 993 570 177 939 030  Diastolic BP 82 68 68 88 84 74 90  Wt. (Lbs) 149.12 140 140 143 135 142.04 145  BMI 23.36 21.93 21.93 22.4 21.14 22.25 23.4       Vaginal discharge Treat with flagyll, and f/u wet prep and culture  Nicotine dependence deniues current nicotine use, counseled to abstain  Drug dependence, continuous abuse Denies illicit drug use

## 2019-09-03 NOTE — Assessment & Plan Note (Signed)
deniues current nicotine use, counseled to abstain

## 2019-09-03 NOTE — Telephone Encounter (Signed)
pls document if done and add charge back for POC , HBA1C testing, I removed the order and charge as this was not documented

## 2019-09-04 DIAGNOSIS — I10 Essential (primary) hypertension: Secondary | ICD-10-CM | POA: Diagnosis not present

## 2019-09-04 LAB — POCT GLYCOSYLATED HEMOGLOBIN (HGB A1C): Hemoglobin A1C: 6.4 % — AB (ref 4.0–5.6)

## 2019-09-04 NOTE — Telephone Encounter (Signed)
Done

## 2019-09-04 NOTE — Addendum Note (Signed)
Addended by: Eual Fines on: 09/04/2019 09:14 AM   Modules accepted: Orders

## 2019-09-05 DIAGNOSIS — I10 Essential (primary) hypertension: Secondary | ICD-10-CM | POA: Diagnosis not present

## 2019-09-06 ENCOUNTER — Encounter: Payer: Self-pay | Admitting: Family Medicine

## 2019-09-06 DIAGNOSIS — I10 Essential (primary) hypertension: Secondary | ICD-10-CM | POA: Diagnosis not present

## 2019-09-07 DIAGNOSIS — I10 Essential (primary) hypertension: Secondary | ICD-10-CM | POA: Diagnosis not present

## 2019-09-08 DIAGNOSIS — I10 Essential (primary) hypertension: Secondary | ICD-10-CM | POA: Diagnosis not present

## 2019-09-08 LAB — URINE CYTOLOGY ANCILLARY ONLY

## 2019-09-11 DIAGNOSIS — I10 Essential (primary) hypertension: Secondary | ICD-10-CM | POA: Diagnosis not present

## 2019-09-12 DIAGNOSIS — I10 Essential (primary) hypertension: Secondary | ICD-10-CM | POA: Diagnosis not present

## 2019-09-12 LAB — URINE CYTOLOGY ANCILLARY ONLY
Chlamydia: NEGATIVE
Comment: NEGATIVE
Comment: NEGATIVE
Comment: NORMAL
Neisseria Gonorrhea: NEGATIVE
Trichomonas: POSITIVE — AB

## 2019-09-13 DIAGNOSIS — I10 Essential (primary) hypertension: Secondary | ICD-10-CM | POA: Diagnosis not present

## 2019-09-14 DIAGNOSIS — I10 Essential (primary) hypertension: Secondary | ICD-10-CM | POA: Diagnosis not present

## 2019-09-15 DIAGNOSIS — I10 Essential (primary) hypertension: Secondary | ICD-10-CM | POA: Diagnosis not present

## 2019-09-27 ENCOUNTER — Encounter: Payer: Medicaid Other | Admitting: Family Medicine

## 2019-09-29 DIAGNOSIS — Z23 Encounter for immunization: Secondary | ICD-10-CM | POA: Diagnosis not present

## 2019-10-03 ENCOUNTER — Other Ambulatory Visit: Payer: Self-pay

## 2019-10-03 ENCOUNTER — Encounter: Payer: Self-pay | Admitting: Family Medicine

## 2019-10-03 ENCOUNTER — Other Ambulatory Visit (HOSPITAL_COMMUNITY)
Admission: RE | Admit: 2019-10-03 | Discharge: 2019-10-03 | Disposition: A | Discharge status: Home / Self Care | Payer: Medicaid Other | Source: Ambulatory Visit | Attending: Family Medicine | Admitting: Family Medicine

## 2019-10-03 ENCOUNTER — Ambulatory Visit (INDEPENDENT_AMBULATORY_CARE_PROVIDER_SITE_OTHER): Payer: Medicaid Other | Admitting: Family Medicine

## 2019-10-03 VITALS — BP 137/81 | HR 80 | Resp 16 | Ht 67.0 in | Wt 149.0 lb

## 2019-10-03 DIAGNOSIS — I10 Essential (primary) hypertension: Secondary | ICD-10-CM | POA: Diagnosis not present

## 2019-10-03 DIAGNOSIS — Z124 Encounter for screening for malignant neoplasm of cervix: Secondary | ICD-10-CM

## 2019-10-03 DIAGNOSIS — F17218 Nicotine dependence, cigarettes, with other nicotine-induced disorders: Secondary | ICD-10-CM | POA: Diagnosis not present

## 2019-10-03 DIAGNOSIS — Z7189 Other specified counseling: Secondary | ICD-10-CM

## 2019-10-03 DIAGNOSIS — E559 Vitamin D deficiency, unspecified: Secondary | ICD-10-CM | POA: Diagnosis not present

## 2019-10-03 DIAGNOSIS — Z0000 Encounter for general adult medical examination without abnormal findings: Secondary | ICD-10-CM

## 2019-10-03 DIAGNOSIS — N760 Acute vaginitis: Secondary | ICD-10-CM | POA: Diagnosis not present

## 2019-10-03 DIAGNOSIS — E1140 Type 2 diabetes mellitus with diabetic neuropathy, unspecified: Secondary | ICD-10-CM | POA: Diagnosis not present

## 2019-10-03 DIAGNOSIS — Z5181 Encounter for therapeutic drug level monitoring: Secondary | ICD-10-CM | POA: Diagnosis not present

## 2019-10-03 NOTE — Patient Instructions (Addendum)
F/U in 5 m onths, call if you need me sooner  Pap and cutures sent  Good you are ready to stop smoking, please stick with this plan  Labs today, lipid, cmp andeGFr, cBC, tSH, vit d  Mammogram to be scheduled a Producer, television/film/video to enter covid # 1  Vaccine you report getting, very important that you get the second vaccine when it is due  Thanks for choosing [facility], we consider it a privelige to serve you.   You are referred to Podiatry toenails thisck and long

## 2019-10-04 ENCOUNTER — Other Ambulatory Visit: Payer: Self-pay | Admitting: Family Medicine

## 2019-10-04 DIAGNOSIS — Z1231 Encounter for screening mammogram for malignant neoplasm of breast: Secondary | ICD-10-CM

## 2019-10-04 NOTE — Progress Notes (Signed)
    Latoya Walsh     MRN: [id number]      DOB: [birth year]  HPI: Patient is in for annual physical exam. No other health concerns are expressed or addressed at the visit. Recent labs, if available are reviewed. Immunization is reviewed , and  updated if needed.   PE: BP 137/81   Pulse 80   Resp 16   Ht 5\' 7"  (1.702 m)   Wt 149 lb 0.6 oz (67.6 kg)   LMP 06/23/2010   SpO2 98%   BMI 23.34 kg/m   Pleasant  female, alert and oriented x 3, in no cardio-pulmonary distress. Afebrile. HEENT No facial trauma or asymetry. Sinuses non tender.  Extra occullar muscles intact.. External ears normal, . Neck: supple, no adenopathy,JVD or thyromegaly.No bruits.  Chest: Clear to ascultation bilaterally.No crackles or wheezes. Non tender to palpation  Breast: No asymetry,no masses or lumps. No tenderness. No nipple discharge or inversion. No axillary or supraclavicular adenopathy  Cardiovascular system; Heart sounds normal,  S1 and  S2 ,no S3.  No murmur, or thrill. Apical beat not displaced Peripheral pulses normal.  Abdomen: Soft, non tender, no organomegaly or masses. No bruits. Bowel sounds normal. No guarding, tenderness or rebound.   GU: External genitalia normal female genitalia , normal female distribution of hair. No lesions. Urethral meatus normal in size, no  Prolapse, no lesions visibly  Present. Bladder non tender. Vagina pink and moist , with no visible lesions ,white  discharge present . Adequate pelvic support no  cystocele or rectocele noted Cervix pink and appears healthy, no lesions or ulcerations noted, white  discharge noted from os Uterus normal size, no adnexal masses, no cervical motion or adnexal tenderness.   Musculoskeletal exam: Full ROM of spine, hips , shoulders and knees. No deformity ,swelling or crepitus noted. No muscle wasting or atrophy.   Neurologic: Cranial nerves 2 to 12 intact. Power, tone ,sensation and reflexes normal  throughout. No disturbance in gait. No tremor.  Skin: Intact, no ulceration, erythema , scaling or rash noted. Pigmentation normal throughout  Psych; Normal mood and affect. J  Assessment & Plan:  Annual physical exam Annual exam as documented. Counseling done  re healthy lifestyle involving commitment to 150 minutes exercise per week, heart healthy diet, and attaining healthy weight.The importance of adequate sleep also discussed. Regular seat belt use and home safety, is also discussed. Changes in health habits are decided on by the patient with goals and time frames  set for achieving them. Immunization and cancer screening needs are specifically addressed at this visit.   Nicotine dependence Asked:confirms currently smokes cigarettes Assess: Unwilling to quit but cutting back Advise: needs to QUIT to reduce risk of cancer, cardio and cerebrovascular disease Assist: counseled for 5 minutes and literature provided Arrange: follow up in 3 months   Counseled about COVID-19 virus infection Pt reports getting one of 2 vaccines. Vaccine education done, she is applauded on having got the first and encouraged strongly to get the 2nd, states she intend to do this  Vaginitis and vulvovaginitis Recently treated for trichomonas, specimen sent for re testing

## 2019-10-05 ENCOUNTER — Telehealth: Payer: Self-pay

## 2019-10-05 LAB — CERVICOVAGINAL ANCILLARY ONLY
Bacterial Vaginitis (gardnerella): POSITIVE — AB
Candida Glabrata: NEGATIVE
Candida Vaginitis: NEGATIVE
Chlamydia: NEGATIVE
Comment: NEGATIVE
Comment: NEGATIVE
Comment: NEGATIVE
Comment: NEGATIVE
Comment: NEGATIVE
Comment: NORMAL
Neisseria Gonorrhea: NEGATIVE
Trichomonas: NEGATIVE

## 2019-10-05 NOTE — Telephone Encounter (Signed)
CPS form  Copied Noted  Sleeved

## 2019-10-05 NOTE — Assessment & Plan Note (Signed)
Asked:confirms currently smokes cigarettes Assess: Unwilling to quit but cutting back Advise: needs to QUIT to reduce risk of cancer, cardio and cerebrovascular disease Assist: counseled for 5 minutes and literature provided Arrange: follow up in 3 months  

## 2019-10-05 NOTE — Assessment & Plan Note (Signed)

## 2019-10-06 DIAGNOSIS — Z7189 Other specified counseling: Secondary | ICD-10-CM | POA: Insufficient documentation

## 2019-10-06 LAB — CYTOLOGY - PAP
Comment: NEGATIVE
Diagnosis: NEGATIVE
High risk HPV: NEGATIVE

## 2019-10-06 NOTE — Assessment & Plan Note (Signed)
Recently treated for trichomonas, specimen sent for re testing

## 2019-10-06 NOTE — Assessment & Plan Note (Signed)
Pt reports getting one of 2 vaccines. Vaccine education done, she is applauded on having got the first and encouraged strongly to get the 2nd, states she intend to do this

## 2019-10-12 DIAGNOSIS — M549 Dorsalgia, unspecified: Secondary | ICD-10-CM

## 2019-10-12 DIAGNOSIS — E1165 Type 2 diabetes mellitus with hyperglycemia: Secondary | ICD-10-CM

## 2019-10-12 DIAGNOSIS — B182 Chronic viral hepatitis C: Secondary | ICD-10-CM

## 2019-10-12 DIAGNOSIS — G8929 Other chronic pain: Secondary | ICD-10-CM

## 2019-11-07 DIAGNOSIS — M549 Dorsalgia, unspecified: Secondary | ICD-10-CM | POA: Diagnosis not present

## 2019-11-08 DIAGNOSIS — M549 Dorsalgia, unspecified: Secondary | ICD-10-CM | POA: Diagnosis not present

## 2019-11-09 DIAGNOSIS — M549 Dorsalgia, unspecified: Secondary | ICD-10-CM | POA: Diagnosis not present

## 2019-11-10 DIAGNOSIS — M549 Dorsalgia, unspecified: Secondary | ICD-10-CM | POA: Diagnosis not present

## 2019-11-11 DIAGNOSIS — M549 Dorsalgia, unspecified: Secondary | ICD-10-CM | POA: Diagnosis not present

## 2019-11-12 DIAGNOSIS — M549 Dorsalgia, unspecified: Secondary | ICD-10-CM | POA: Diagnosis not present

## 2019-11-13 DIAGNOSIS — M549 Dorsalgia, unspecified: Secondary | ICD-10-CM | POA: Diagnosis not present

## 2019-11-14 DIAGNOSIS — M549 Dorsalgia, unspecified: Secondary | ICD-10-CM | POA: Diagnosis not present

## 2019-11-15 DIAGNOSIS — M549 Dorsalgia, unspecified: Secondary | ICD-10-CM | POA: Diagnosis not present

## 2019-11-16 DIAGNOSIS — M549 Dorsalgia, unspecified: Secondary | ICD-10-CM | POA: Diagnosis not present

## 2019-11-17 DIAGNOSIS — M549 Dorsalgia, unspecified: Secondary | ICD-10-CM | POA: Diagnosis not present

## 2019-11-18 DIAGNOSIS — M549 Dorsalgia, unspecified: Secondary | ICD-10-CM | POA: Diagnosis not present

## 2019-11-19 DIAGNOSIS — M549 Dorsalgia, unspecified: Secondary | ICD-10-CM | POA: Diagnosis not present

## 2019-11-22 ENCOUNTER — Other Ambulatory Visit: Payer: Self-pay | Admitting: Family Medicine

## 2019-11-22 DIAGNOSIS — M549 Dorsalgia, unspecified: Secondary | ICD-10-CM | POA: Diagnosis not present

## 2019-11-23 DIAGNOSIS — M549 Dorsalgia, unspecified: Secondary | ICD-10-CM | POA: Diagnosis not present

## 2019-11-24 ENCOUNTER — Other Ambulatory Visit: Payer: Self-pay

## 2019-11-24 ENCOUNTER — Ambulatory Visit
Admission: RE | Admit: 2019-11-24 | Discharge: 2019-11-24 | Disposition: A | Discharge status: Home / Self Care | Payer: Medicaid Other | Source: Ambulatory Visit | Attending: Family Medicine | Admitting: Family Medicine

## 2019-11-24 DIAGNOSIS — Z1231 Encounter for screening mammogram for malignant neoplasm of breast: Secondary | ICD-10-CM | POA: Diagnosis not present

## 2019-11-24 DIAGNOSIS — M549 Dorsalgia, unspecified: Secondary | ICD-10-CM | POA: Diagnosis not present

## 2019-11-25 DIAGNOSIS — M549 Dorsalgia, unspecified: Secondary | ICD-10-CM | POA: Diagnosis not present

## 2019-11-26 DIAGNOSIS — M549 Dorsalgia, unspecified: Secondary | ICD-10-CM | POA: Diagnosis not present

## 2019-11-27 DIAGNOSIS — M549 Dorsalgia, unspecified: Secondary | ICD-10-CM | POA: Diagnosis not present

## 2019-11-28 DIAGNOSIS — M549 Dorsalgia, unspecified: Secondary | ICD-10-CM | POA: Diagnosis not present

## 2019-11-29 DIAGNOSIS — M549 Dorsalgia, unspecified: Secondary | ICD-10-CM | POA: Diagnosis not present

## 2019-11-30 DIAGNOSIS — M549 Dorsalgia, unspecified: Secondary | ICD-10-CM | POA: Diagnosis not present

## 2019-12-01 DIAGNOSIS — M549 Dorsalgia, unspecified: Secondary | ICD-10-CM | POA: Diagnosis not present

## 2019-12-02 DIAGNOSIS — M549 Dorsalgia, unspecified: Secondary | ICD-10-CM | POA: Diagnosis not present

## 2019-12-03 DIAGNOSIS — M549 Dorsalgia, unspecified: Secondary | ICD-10-CM | POA: Diagnosis not present

## 2019-12-04 DIAGNOSIS — M549 Dorsalgia, unspecified: Secondary | ICD-10-CM | POA: Diagnosis not present

## 2019-12-05 DIAGNOSIS — M549 Dorsalgia, unspecified: Secondary | ICD-10-CM | POA: Diagnosis not present

## 2019-12-06 DIAGNOSIS — M549 Dorsalgia, unspecified: Secondary | ICD-10-CM | POA: Diagnosis not present

## 2019-12-07 DIAGNOSIS — M549 Dorsalgia, unspecified: Secondary | ICD-10-CM | POA: Diagnosis not present

## 2019-12-08 DIAGNOSIS — M549 Dorsalgia, unspecified: Secondary | ICD-10-CM | POA: Diagnosis not present

## 2019-12-09 DIAGNOSIS — M549 Dorsalgia, unspecified: Secondary | ICD-10-CM | POA: Diagnosis not present

## 2019-12-10 DIAGNOSIS — M549 Dorsalgia, unspecified: Secondary | ICD-10-CM | POA: Diagnosis not present

## 2019-12-11 DIAGNOSIS — M549 Dorsalgia, unspecified: Secondary | ICD-10-CM | POA: Diagnosis not present

## 2019-12-12 ENCOUNTER — Emergency Department (HOSPITAL_COMMUNITY)
Admission: EM | Admit: 2019-12-12 | Discharge: 2019-12-12 | Disposition: A | Discharge status: Home / Self Care | Payer: Medicaid Other | Attending: Emergency Medicine | Admitting: Emergency Medicine

## 2019-12-12 ENCOUNTER — Other Ambulatory Visit: Payer: Self-pay

## 2019-12-12 ENCOUNTER — Encounter (HOSPITAL_COMMUNITY): Payer: Self-pay | Admitting: *Deleted

## 2019-12-12 DIAGNOSIS — M791 Myalgia, unspecified site: Secondary | ICD-10-CM | POA: Insufficient documentation

## 2019-12-12 DIAGNOSIS — R509 Fever, unspecified: Secondary | ICD-10-CM | POA: Insufficient documentation

## 2019-12-12 DIAGNOSIS — Z5321 Procedure and treatment not carried out due to patient leaving prior to being seen by health care provider: Secondary | ICD-10-CM | POA: Diagnosis not present

## 2019-12-12 DIAGNOSIS — M549 Dorsalgia, unspecified: Secondary | ICD-10-CM | POA: Diagnosis not present

## 2019-12-12 NOTE — ED Triage Notes (Signed)
Pt c/o fever, with generalized body aches x one day; pt has not taken her BP pill in a month

## 2019-12-13 ENCOUNTER — Telehealth: Payer: Self-pay | Admitting: *Deleted

## 2019-12-13 ENCOUNTER — Telehealth: Payer: Self-pay

## 2019-12-13 DIAGNOSIS — M549 Dorsalgia, unspecified: Secondary | ICD-10-CM | POA: Diagnosis not present

## 2019-12-13 NOTE — Telephone Encounter (Signed)
Emailed request to [facility] to schedule  NP appointment .   Mayes Sangiovanni PEC [phone]                                                                                       

## 2019-12-13 NOTE — Telephone Encounter (Signed)
  I called pt and spoke to her about her ED visit yesterday. She stated that she left the hospital because there wasn't any beds. I advised her to call her PCP to see if she can get an appointment with her to be evaluated or come back to the ED. Pt verbalized understanding and nothing further is needed.

## 2019-12-14 DIAGNOSIS — M549 Dorsalgia, unspecified: Secondary | ICD-10-CM | POA: Diagnosis not present

## 2019-12-15 DIAGNOSIS — M549 Dorsalgia, unspecified: Secondary | ICD-10-CM | POA: Diagnosis not present

## 2019-12-16 ENCOUNTER — Other Ambulatory Visit: Payer: Self-pay

## 2019-12-16 ENCOUNTER — Encounter (HOSPITAL_COMMUNITY): Payer: Self-pay | Admitting: Emergency Medicine

## 2019-12-16 ENCOUNTER — Emergency Department (HOSPITAL_COMMUNITY)
Admission: EM | Admit: 2019-12-16 | Discharge: 2019-12-16 | Disposition: A | Discharge status: Home / Self Care | Payer: Medicaid Other | Attending: Emergency Medicine | Admitting: Emergency Medicine

## 2019-12-16 DIAGNOSIS — I10 Essential (primary) hypertension: Secondary | ICD-10-CM | POA: Insufficient documentation

## 2019-12-16 DIAGNOSIS — E1140 Type 2 diabetes mellitus with diabetic neuropathy, unspecified: Secondary | ICD-10-CM | POA: Diagnosis not present

## 2019-12-16 DIAGNOSIS — M549 Dorsalgia, unspecified: Secondary | ICD-10-CM | POA: Diagnosis not present

## 2019-12-16 DIAGNOSIS — F17210 Nicotine dependence, cigarettes, uncomplicated: Secondary | ICD-10-CM | POA: Insufficient documentation

## 2019-12-16 DIAGNOSIS — Z7984 Long term (current) use of oral hypoglycemic drugs: Secondary | ICD-10-CM | POA: Insufficient documentation

## 2019-12-16 DIAGNOSIS — R05 Cough: Secondary | ICD-10-CM | POA: Diagnosis present

## 2019-12-16 DIAGNOSIS — Z7982 Long term (current) use of aspirin: Secondary | ICD-10-CM | POA: Insufficient documentation

## 2019-12-16 DIAGNOSIS — U071 COVID-19: Secondary | ICD-10-CM

## 2019-12-16 DIAGNOSIS — Z79899 Other long term (current) drug therapy: Secondary | ICD-10-CM | POA: Insufficient documentation

## 2019-12-16 LAB — RESPIRATORY PANEL BY RT PCR (FLU A&B, COVID)
Influenza A by PCR: NEGATIVE
Influenza B by PCR: NEGATIVE
SARS Coronavirus 2 by RT PCR: POSITIVE — AB

## 2019-12-16 NOTE — ED Provider Notes (Signed)
[facility] EMERGENCY DEPARTMENT Provider Note   CSN: [id number] Arrival date & time: 12/16/19  2019     History Chief Complaint  Patient presents with  . Cough    Latoya Walsh is a 59 y.o. female   Cough Cough characteristics:  Dry Sputum characteristics:  Nondescript Severity:  Moderate Onset quality:  Gradual Timing:  Constant Progression:  Unchanged Chronicity:  New Smoker: yes   Context: sick contacts and upper respiratory infection   Context: not animal exposure, not exposure to allergens, not fumes, not occupational exposure, not smoke exposure, not weather changes and not with activity   Relieved by:  Nothing Worsened by:  Nothing Ineffective treatments:  None tried Associated symptoms: chest pain, chills, fever, headaches, myalgias and sore throat   Associated symptoms: no shortness of breath        Past Medical History:  Diagnosis Date  . Allergic rhinitis   . Anxiety   . Chronic back pain   . Chronic hepatitis C without hepatic coma ([place])   . Depression   . Diabetes mellitus   . Hepatitis C   . Hypertension   . Noncompliance   . Poor appetite 07/17/2014  . Substance abuse ([place])    HX of drug use and alcohol use    Patient Active Problem List   Diagnosis Date Noted  . Counseled about COVID-19 virus infection 10/06/2019  . Vaginal discharge 08/30/2019  . Depression, major, single episode, in partial remission ([place]) 01/25/2019  . Headache 11/13/2018  . Insomnia 09/25/2018  . Annual physical exam 07/23/2016  . Lumbar back pain with radiculopathy affecting right lower extremity 03/27/2015  . Non compliance with medical treatment 07/22/2014  . Chronic hepatitis C without hepatic coma ([place]) 12/04/2013  . Vaginitis and vulvovaginitis 11/23/2013  . Allergic rhinitis 11/03/2012  . HTN, goal below 130/80 06/25/2011  . Anxiety and depression 02/05/2011  . Nicotine dependence 11/01/2010  . Drug dependence, continuous abuse ([place]) 03/22/2010  .  Controlled type 2 diabetes with neuropathy ([place]) 06/02/2007  . Alcohol abuse 06/02/2007    Past Surgical History:  Procedure Laterality Date  . APPENDECTOMY  2004     OB History   No obstetric history on file.     Family History  Problem Relation Age of Onset  . Diabetes Sister        Twin - AIDS     Social History   Tobacco Use  . Smoking status: Current Every Day Smoker    Packs/day: 0.25    Types: Cigarettes  . Smokeless tobacco: Never Used  . Tobacco comment: cutting back   Substance Use Topics  . Alcohol use: Yes    Alcohol/week: 40.0 standard drinks    Types: 40 Cans of beer per week    Comment: 2 16oz cans of beer a day  . Drug use: Yes    Types: Marijuana, Cocaine    Comment: Last smoked marijuana yesterday 10/18/14    Home Medications Prior to Admission medications   Medication Sig Start Date End Date Taking? Authorizing Provider  amLODipine (NORVASC) 5 MG tablet TAKE ONE TABLET BY MOUTH ONCE DAILY. 11/23/19   [name], MD  aspirin EC 81 MG tablet Take 1 tablet (81 mg total) by mouth daily. 04/04/19   [name], MD  blood glucose meter kit and supplies Dispense based on patient and insurance preference. Once daily testing dx E11.9. 09/20/18   [name], MD  FLUoxetine (PROZAC) 20 MG tablet Take 1  tablet (20 mg total) by mouth daily. Patient not taking: Reported on 10/03/2019 01/24/19   [name], MD  glipiZIDE (GLUCOTROL XL) 5 MG 24 hr tablet Take 1 tablet (5 mg total) by mouth daily with breakfast. 04/04/19   [name], MD  rosuvastatin (CRESTOR) 5 MG tablet Take 1 tablet (5 mg total) by mouth daily. Patient not taking: Reported on 10/03/2019 04/04/19   [name], MD    Allergies    Penicillins  Review of Systems   Review of Systems  Constitutional: Positive for chills and fever.  HENT: Positive for sore throat.   Respiratory: Positive for cough. Negative for shortness of breath.   Cardiovascular:  Positive for chest pain.  Musculoskeletal: Positive for myalgias.  Neurological: Positive for headaches.    Physical Exam Updated Vital Signs BP 127/72 (BP Location: Left Arm)   Pulse 96   Temp 99 F (37.2 C) (Oral)   Resp 17   Ht 5' 7"  (1.702 m)   Wt 69.4 kg   LMP 06/23/2010   SpO2 94%   BMI 23.96 kg/m   Physical Exam Vitals and nursing note reviewed.  Constitutional:      General: She is not in acute distress.    Appearance: She is well-developed. She is not diaphoretic.  HENT:     Head: Normocephalic and atraumatic.  Eyes:     General: No scleral icterus.    Conjunctiva/sclera: Conjunctivae normal.  Cardiovascular:     Rate and Rhythm: Normal rate and regular rhythm.     Heart sounds: Normal heart sounds. No murmur heard.  No friction rub. No gallop.   Pulmonary:     Effort: Pulmonary effort is normal. No respiratory distress.     Breath sounds: Normal breath sounds. No wheezing or rhonchi.  Abdominal:     General: Bowel sounds are normal. There is no distension.     Palpations: Abdomen is soft. There is no mass.     Tenderness: There is no abdominal tenderness. There is no guarding.  Musculoskeletal:     Cervical back: Normal range of motion.  Skin:    General: Skin is warm and dry.  Neurological:     Mental Status: She is alert and oriented to person, place, and time.  Psychiatric:        Behavior: Behavior normal.     ED Results / Procedures / Treatments   Labs (all labs ordered are listed, but only abnormal results are displayed) Labs Reviewed  RESPIRATORY PANEL BY RT PCR (FLU A&B, COVID) - Abnormal; Notable for the following components:      Result Value   SARS Coronavirus 2 by RT PCR POSITIVE (*)    All other components within normal limits    EKG None  Radiology No results found.  Procedures Procedures (including critical care time)  Medications Ordered in ED Medications - No data to display  ED Course  I have reviewed the triage  vital signs and the nursing notes.  Pertinent labs & imaging results that were available during my care of the patient were reviewed by me and considered in my medical decision making (see chart for details).    MDM Rules/Calculators/A&P                          Patient with Covid 19 infection No resp failure. Referred to [facility] infusion center.  Latoya Walsh was evaluated in Emergency Department on 12/16/2019 for  the symptoms described in the history of present illness. She was evaluated in the context of the global COVID-19 pandemic, which necessitated consideration that the patient might be at risk for infection with the SARS-CoV-2 virus that causes COVID-19. Institutional protocols and algorithms that pertain to the evaluation of patients at risk for COVID-19 are in a state of rapid change based on information released by regulatory bodies including the CDC and federal and state organizations. These policies and algorithms were followed during the patient's care in the ED.  Final Clinical Impression(s) / ED Diagnoses Final diagnoses:  COVID-19 virus infection    Rx / DC Orders ED Discharge Orders    None       Margarita Mail, PA-C 12/16/19 2209    Davonna Belling, MD 12/16/19 2348

## 2019-12-16 NOTE — ED Triage Notes (Signed)
Pt complains of a cough and chills for the past 3 days. Pt states she took an at home covid test that was positive. Patient states she has had one [place] vaccination, but failed to return for the second vaccination.

## 2019-12-16 NOTE — Discharge Summary (Signed)
Pt reports she needs a ride home   Call to [facility] who states  Pt needs to call family   There is no transport available

## 2019-12-16 NOTE — Discharge Instructions (Addendum)
Person Under Monitoring Name: Latoya Walsh  Location: [address] [place] [address]   Infection Prevention Recommendations for Individuals Confirmed to have, or Being Evaluated for, 2019 Novel Coronavirus (COVID-19) Infection Who Receive Care at Home  Individuals who are confirmed to have, or are being evaluated for, COVID-19 should follow the prevention steps below until a healthcare provider or local or state health department says they can return to normal activities.  Stay home except to get medical care You should restrict activities outside your home, except for getting medical care. Do not go to work, school, or public areas, and do not use public transportation or taxis.  Call ahead before visiting your doctor Before your medical appointment, call the healthcare provider and tell them that you have, or are being evaluated for, COVID-19 infection. This will help the healthcare provider's office take steps to keep other people from getting infected. Ask your healthcare provider to call the local or state health department.  Monitor your symptoms Seek prompt medical attention if your illness is worsening (e.g., difficulty breathing). Before going to your medical appointment, call the healthcare provider and tell them that you have, or are being evaluated for, COVID-19 infection. Ask your healthcare provider to call the local or state health department.  Wear a facemask You should wear a facemask that covers your nose and mouth when you are in the same room with other people and when you visit a healthcare provider. People who live with or visit you should also wear a facemask while they are in the same room with you.  Separate yourself from other people in your home As much as possible, you should stay in a different room from other people in your home. Also, you should use a separate bathroom, if available.  Avoid sharing household items You should not share  dishes, drinking glasses, cups, eating utensils, towels, bedding, or other items with other people in your home. After using these items, you should wash them thoroughly with soap and water.  Cover your coughs and sneezes Cover your mouth and nose with a tissue when you cough or sneeze, or you can cough or sneeze into your sleeve. Throw used tissues in a lined trash can, and immediately wash your hands with soap and water for at least 20 seconds or use an alcohol-based hand rub.  Wash your [facility] your hands often and thoroughly with soap and water for at least 20 seconds. You can use an alcohol-based hand sanitizer if soap and water are not available and if your hands are not visibly dirty. Avoid touching your eyes, nose, and mouth with unwashed hands.   Prevention Steps for Caregivers and Household Members of Individuals Confirmed to have, or Being Evaluated for, COVID-19 Infection Being Cared for in the Home  If you live with, or provide care at home for, a person confirmed to have, or being evaluated for, COVID-19 infection please follow these guidelines to prevent infection:  Follow healthcare provider's instructions Make sure that you understand and can help the patient follow any healthcare provider instructions for all care.  Provide for the patient's basic needs You should help the patient with basic needs in the home and provide support for getting groceries, prescriptions, and other personal needs.  Monitor the patient's symptoms If they are getting sicker, call his or her medical provider and tell them that the patient has, or is being evaluated for, COVID-19 infection. This will help the healthcare provider's office  take steps to keep other people from getting infected. Ask the healthcare provider to call the local or state health department.  Limit the number of people who have contact with the patient If possible, have only one caregiver for the patient. Other  household members should stay in another home or place of residence. If this is not possible, they should stay in another room, or be separated from the patient as much as possible. Use a separate bathroom, if available. Restrict visitors who do not have an essential need to be in the home.  Keep older adults, very young children, and other sick people away from the patient Keep older adults, very young children, and those who have compromised immune systems or chronic health conditions away from the patient. This includes people with chronic heart, lung, or kidney conditions, diabetes, and cancer.  Ensure good ventilation Make sure that shared spaces in the home have good air flow, such as from an air conditioner or an opened window, weather permitting.  Wash your hands often Wash your hands often and thoroughly with soap and water for at least 20 seconds. You can use an alcohol based hand sanitizer if soap and water are not available and if your hands are not visibly dirty. Avoid touching your eyes, nose, and mouth with unwashed hands. Use disposable paper towels to dry your hands. If not available, use dedicated cloth towels and replace them when they become wet.  Wear a facemask and gloves Wear a disposable facemask at all times in the room and gloves when you touch or have contact with the patient's blood, body fluids, and/or secretions or excretions, such as sweat, saliva, sputum, nasal mucus, vomit, urine, or feces.  Ensure the mask fits over your nose and mouth tightly, and do not touch it during use. Throw out disposable facemasks and gloves after using them. Do not reuse. Wash your hands immediately after removing your facemask and gloves. If your personal clothing becomes contaminated, carefully remove clothing and launder. Wash your hands after handling contaminated clothing. Place all used disposable facemasks, gloves, and other waste in a lined container before disposing them with  other household waste. Remove gloves and wash your hands immediately after handling these items.  Do not share dishes, glasses, or other household items with the patient Avoid sharing household items. You should not share dishes, drinking glasses, cups, eating utensils, towels, bedding, or other items with a patient who is confirmed to have, or being evaluated for, COVID-19 infection. After the person uses these items, you should wash them thoroughly with soap and water.  Wash laundry thoroughly Immediately remove and wash clothes or bedding that have blood, body fluids, and/or secretions or excretions, such as sweat, saliva, sputum, nasal mucus, vomit, urine, or feces, on them. Wear gloves when handling laundry from the patient. Read and follow directions on labels of laundry or clothing items and detergent. In general, wash and dry with the warmest temperatures recommended on the label.  Clean all areas the individual has used often Clean all touchable surfaces, such as counters, tabletops, doorknobs, bathroom fixtures, toilets, phones, keyboards, tablets, and bedside tables, every day. Also, clean any surfaces that may have blood, body fluids, and/or secretions or excretions on them. Wear gloves when cleaning surfaces the patient has come in contact with. Use a diluted bleach solution (e.g., dilute bleach with 1 part bleach and 10 parts water) or a household disinfectant with a label that says EPA-registered for coronaviruses. To make a bleach  solution at home, add 1 tablespoon of bleach to 1 quart (4 cups) of water. For a larger supply, add  cup of bleach to 1 gallon (16 cups) of water. Read labels of cleaning products and follow recommendations provided on product labels. Labels contain instructions for safe and effective use of the cleaning product including precautions you should take when applying the product, such as wearing gloves or eye protection and making sure you have good ventilation  during use of the product. Remove gloves and wash hands immediately after cleaning.  Monitor yourself for signs and symptoms of illness Caregivers and household members are considered close contacts, should monitor their health, and will be asked to limit movement outside of the home to the extent possible. Follow the monitoring steps for close contacts listed on the symptom monitoring form.   ? If you have additional questions, contact your local health department or call the epidemiologist on call at [phone] (available 24/7). ? This guidance is subject to change. For the most up-to-date guidance from [facility], please refer to their website: [email or web address]

## 2019-12-17 ENCOUNTER — Telehealth (HOSPITAL_COMMUNITY): Payer: Self-pay | Admitting: Nurse Practitioner

## 2019-12-17 DIAGNOSIS — M549 Dorsalgia, unspecified: Secondary | ICD-10-CM | POA: Diagnosis not present

## 2019-12-17 NOTE — Telephone Encounter (Signed)
Called to discuss with [name] about Covid symptoms and the use of casirivimab/imdevimab, a combination monoclonal antibody infusion for those with mild to moderate Covid symptoms and at a high risk of hospitalization.     Pt is qualified for this infusion at the [facility] infusion center due to co-morbid conditions (as indicated below).  Unable to reach patient. No voicemail set up on phone. Secured message sent (Privacy appropriate).   Patient Active Problem List   Diagnosis Date Noted  . Counseled about COVID-19 virus infection 10/06/2019  . Vaginal discharge 08/30/2019  . Depression, major, single episode, in partial remission ([place]) 01/25/2019  . Headache 11/13/2018  . Insomnia 09/25/2018  . Annual physical exam 07/23/2016  . Lumbar back pain with radiculopathy affecting right lower extremity 03/27/2015  . Non compliance with medical treatment 07/22/2014  . Chronic hepatitis C without hepatic coma ([place]) 12/04/2013  . Vaginitis and vulvovaginitis 11/23/2013  . Allergic rhinitis 11/03/2012  . HTN, goal below 130/80 06/25/2011  . Anxiety and depression 02/05/2011  . Nicotine dependence 11/01/2010  . Drug dependence, continuous abuse ([place]) 03/22/2010  . Controlled type 2 diabetes with neuropathy ([place]) 06/02/2007  . Alcohol abuse 06/02/2007    Alda Lea, AGPCNP-BC

## 2019-12-18 ENCOUNTER — Telehealth: Payer: Self-pay

## 2019-12-18 ENCOUNTER — Telehealth: Payer: Self-pay | Admitting: *Deleted

## 2019-12-18 DIAGNOSIS — M549 Dorsalgia, unspecified: Secondary | ICD-10-CM | POA: Diagnosis not present

## 2019-12-18 NOTE — Telephone Encounter (Signed)
Transition Care Management Follow-up Telephone Call  Date of discharge and from where: [facility]  How have you been since you were released from the hospital? She is still coughing   Any questions or concerns? No  Items Reviewed:  Did the pt receive and understand the discharge instructions provided? Yes   Medications obtained and verified? Yes   Any new allergies since your discharge? No   Dietary orders reviewed? Yes  Do you have support at home? No  lives alone  Functional Questionnaire: (I = Independent and D = Dependent) ADLs: I  Bathing/Dressing- I  Meal Prep- I  Eating- I Less appetite  Maintaining continence- I   Transferring/Ambulation- I  Managing Meds- I  Follow up appointments reviewed:   PCP Hospital f/u appt confirmed? Yes  Dr. [name] 03/05/20 Advised her to call for sooner appt  [facility] f/u appt confirmed? Yes    Are transportation arrangements needed? No      If their condition worsens, is the pt aware to call PCP or go to the Emergency Dept.? Yes  Was the patient provided with contact information for the PCP's office or ED? Yes  Was to pt encouraged to call back with questions or concerns? Yes  TA/CMA

## 2019-12-18 NOTE — Telephone Encounter (Signed)
Emailed request to [facility] to schedule follow up appointment .   Corlene Sabia PEC [phone]

## 2019-12-19 DIAGNOSIS — M549 Dorsalgia, unspecified: Secondary | ICD-10-CM | POA: Diagnosis not present

## 2019-12-20 ENCOUNTER — Other Ambulatory Visit: Payer: Self-pay

## 2019-12-20 ENCOUNTER — Encounter: Payer: Self-pay | Admitting: Family Medicine

## 2019-12-20 ENCOUNTER — Telehealth (INDEPENDENT_AMBULATORY_CARE_PROVIDER_SITE_OTHER): Payer: Medicaid Other | Admitting: Family Medicine

## 2019-12-20 VITALS — BP 124/71 | Ht 67.0 in | Wt 153.0 lb

## 2019-12-20 DIAGNOSIS — U071 COVID-19: Secondary | ICD-10-CM

## 2019-12-20 DIAGNOSIS — E559 Vitamin D deficiency, unspecified: Secondary | ICD-10-CM

## 2019-12-20 DIAGNOSIS — E1140 Type 2 diabetes mellitus with diabetic neuropathy, unspecified: Secondary | ICD-10-CM

## 2019-12-20 DIAGNOSIS — I10 Essential (primary) hypertension: Secondary | ICD-10-CM

## 2019-12-20 DIAGNOSIS — M549 Dorsalgia, unspecified: Secondary | ICD-10-CM | POA: Diagnosis not present

## 2019-12-20 DIAGNOSIS — Z13220 Encounter for screening for lipoid disorders: Secondary | ICD-10-CM

## 2019-12-20 NOTE — Patient Instructions (Addendum)
Nurse visit for flu vaccine on 01/05/2020, please give pt appt info as far as time and date  Are  Concerned (call her with this please)  Keep MD appointment mid December as before   Pt to get fasting CBC, lipid, cmp and eGFr, tSH and hBA1C and vitamin D in lab the morning of 10 15/2021  Stay isolated at home until next week Tuesday, 12/25/2019  Increase activity in the home slowly as you are doing, good that this morning you were able to clean your home and was eratring oatmeal, and wanted orange juice  Thanks for choosing [facility], we consider it a privelige to serve you.    

## 2019-12-20 NOTE — Progress Notes (Signed)
Virtual Visit via Telephone Note  I connected with Latoya Walsh on 12/20/19 at  8:40 AM EDT by telephone and verified that I am speaking with the correct person using two identifiers.  Location: Patient: home Provider: office   I discussed the limitations, risks, security and privacy concerns of performing an evaluation and management service by telephone and the availability of in person appointments. I also discussed with the patient that there may be a patient responsible charge related to this service. The patient expressed understanding and agreed to proceed.   History of Present Illness: Recent dx of covid on 12/16/2019, c/o fatigued , weakness, excess cough, headache  and poor appetite. Intermittent chills , no fever since yesterday and notes slight improvement in energy and appetite. Currently socially isolated , will have juice delivered. Diagnosed in the ED, symptomatic treatment only , twin also infected    Observations/Objective: BP 124/71    Ht 5\' 7"  (1.702 m)    Wt 153 lb (69.4 kg)    LMP 06/23/2010    BMI 23.96 kg/m  Good communication with no confusion and intact memory. Alert and oriented x 3 No signs of respiratory distress during speech    Assessment and Plan: COVID-19 virus infection Counseled re symptomatic management , and advised to remain socially isolated for an additional 10 days. Advised  to  Return tot the ED if symptoms of respiratory distress develop    Follow Up Instructions:    I discussed the assessment and treatment plan with the patient. The patient was provided an opportunity to ask questions and all were answered. The patient agreed with the plan and demonstrated an understanding of the instructions.   The patient was advised to call back or seek an in-person evaluation if the symptoms worsen or if the condition fails to improve as anticipated.  I provided 12 minutes of non-face-to-face time during this encounter.   Tula Nakayama,  MD

## 2019-12-21 DIAGNOSIS — M549 Dorsalgia, unspecified: Secondary | ICD-10-CM | POA: Diagnosis not present

## 2019-12-22 DIAGNOSIS — M549 Dorsalgia, unspecified: Secondary | ICD-10-CM | POA: Diagnosis not present

## 2019-12-23 DIAGNOSIS — M549 Dorsalgia, unspecified: Secondary | ICD-10-CM | POA: Diagnosis not present

## 2019-12-24 DIAGNOSIS — M549 Dorsalgia, unspecified: Secondary | ICD-10-CM | POA: Diagnosis not present

## 2019-12-25 DIAGNOSIS — M549 Dorsalgia, unspecified: Secondary | ICD-10-CM | POA: Diagnosis not present

## 2019-12-26 ENCOUNTER — Encounter: Payer: Self-pay | Admitting: Family Medicine

## 2019-12-26 DIAGNOSIS — U071 COVID-19: Secondary | ICD-10-CM

## 2019-12-26 DIAGNOSIS — M549 Dorsalgia, unspecified: Secondary | ICD-10-CM | POA: Diagnosis not present

## 2019-12-26 HISTORY — DX: COVID-19: U07.1

## 2019-12-26 NOTE — Assessment & Plan Note (Signed)
Counseled re symptomatic management , and advised to remain socially isolated for an additional 10 days. Advised  to  Return tot the ED if symptoms of respiratory distress develop

## 2019-12-27 DIAGNOSIS — M549 Dorsalgia, unspecified: Secondary | ICD-10-CM | POA: Diagnosis not present

## 2019-12-28 DIAGNOSIS — M549 Dorsalgia, unspecified: Secondary | ICD-10-CM | POA: Diagnosis not present

## 2019-12-29 DIAGNOSIS — M549 Dorsalgia, unspecified: Secondary | ICD-10-CM | POA: Diagnosis not present

## 2019-12-30 DIAGNOSIS — M549 Dorsalgia, unspecified: Secondary | ICD-10-CM | POA: Diagnosis not present

## 2019-12-31 DIAGNOSIS — M549 Dorsalgia, unspecified: Secondary | ICD-10-CM | POA: Diagnosis not present

## 2020-01-01 DIAGNOSIS — M549 Dorsalgia, unspecified: Secondary | ICD-10-CM | POA: Diagnosis not present

## 2020-01-02 DIAGNOSIS — M549 Dorsalgia, unspecified: Secondary | ICD-10-CM | POA: Diagnosis not present

## 2020-01-03 DIAGNOSIS — M549 Dorsalgia, unspecified: Secondary | ICD-10-CM | POA: Diagnosis not present

## 2020-01-04 DIAGNOSIS — M549 Dorsalgia, unspecified: Secondary | ICD-10-CM | POA: Diagnosis not present

## 2020-01-05 ENCOUNTER — Other Ambulatory Visit: Payer: Self-pay

## 2020-01-05 ENCOUNTER — Ambulatory Visit: Payer: Medicaid Other

## 2020-01-05 DIAGNOSIS — E1140 Type 2 diabetes mellitus with diabetic neuropathy, unspecified: Secondary | ICD-10-CM | POA: Diagnosis not present

## 2020-01-05 DIAGNOSIS — E559 Vitamin D deficiency, unspecified: Secondary | ICD-10-CM | POA: Diagnosis not present

## 2020-01-05 DIAGNOSIS — M549 Dorsalgia, unspecified: Secondary | ICD-10-CM | POA: Diagnosis not present

## 2020-01-05 DIAGNOSIS — I10 Essential (primary) hypertension: Secondary | ICD-10-CM | POA: Diagnosis not present

## 2020-01-05 DIAGNOSIS — Z13220 Encounter for screening for lipoid disorders: Secondary | ICD-10-CM | POA: Diagnosis not present

## 2020-01-06 DIAGNOSIS — M549 Dorsalgia, unspecified: Secondary | ICD-10-CM | POA: Diagnosis not present

## 2020-01-06 LAB — CBC
Hematocrit: 31.9 % — ABNORMAL LOW (ref 34.0–46.6)
Hemoglobin: 10.5 g/dL — ABNORMAL LOW (ref 11.1–15.9)
MCH: 29.7 pg (ref 26.6–33.0)
MCHC: 32.9 g/dL (ref 31.5–35.7)
MCV: 90 fL (ref 79–97)
Platelets: 191 10*3/uL (ref 150–450)
RBC: 3.53 x10E6/uL — ABNORMAL LOW (ref 3.77–5.28)
RDW: 12.7 % (ref 11.7–15.4)
WBC: 7.6 10*3/uL (ref 3.4–10.8)

## 2020-01-06 LAB — VITAMIN D 25 HYDROXY (VIT D DEFICIENCY, FRACTURES): Vit D, 25-Hydroxy: 9.5 ng/mL — ABNORMAL LOW (ref 30.0–100.0)

## 2020-01-06 LAB — HEMOGLOBIN A1C
Est. average glucose Bld gHb Est-mCnc: 146 mg/dL
Hgb A1c MFr Bld: 6.7 % — ABNORMAL HIGH (ref 4.8–5.6)

## 2020-01-06 LAB — CMP14+EGFR
ALT: 25 IU/L (ref 0–32)
AST: 19 IU/L (ref 0–40)
Albumin/Globulin Ratio: 0.9 — ABNORMAL LOW (ref 1.2–2.2)
Albumin: 3 g/dL — ABNORMAL LOW (ref 3.8–4.9)
Alkaline Phosphatase: 141 IU/L — ABNORMAL HIGH (ref 44–121)
BUN/Creatinine Ratio: 17 (ref 9–23)
BUN: 33 mg/dL — ABNORMAL HIGH (ref 6–24)
Bilirubin Total: <0.2 mg/dL (ref 0.0–1.2)
CO2: 21 mmol/L (ref 20–29)
Calcium: 8.5 mg/dL — ABNORMAL LOW (ref 8.7–10.2)
Chloride: 108 mmol/L — ABNORMAL HIGH (ref 96–106)
Creatinine, Ser: 1.92 mg/dL — ABNORMAL HIGH (ref 0.57–1.00)
GFR calc Af Amer: 33 mL/min/{1.73_m2} — ABNORMAL LOW (ref >59)
GFR calc non Af Amer: 29 mL/min/{1.73_m2} — ABNORMAL LOW (ref >59)
Globulin, Total: 3.2 g/dL (ref 1.5–4.5)
Glucose: 94 mg/dL (ref 65–99)
Potassium: 4.5 mmol/L (ref 3.5–5.2)
Sodium: 141 mmol/L (ref 134–144)
Total Protein: 6.2 g/dL (ref 6.0–8.5)

## 2020-01-06 LAB — LIPID PANEL
Chol/HDL Ratio: 6.3 ratio — ABNORMAL HIGH (ref 0.0–4.4)
Cholesterol, Total: 215 mg/dL — ABNORMAL HIGH (ref 100–199)
HDL: 34 mg/dL — ABNORMAL LOW (ref >39)
LDL Chol Calc (NIH): 137 mg/dL — ABNORMAL HIGH (ref 0–99)
Triglycerides: 244 mg/dL — ABNORMAL HIGH (ref 0–149)
VLDL Cholesterol Cal: 44 mg/dL — ABNORMAL HIGH (ref 5–40)

## 2020-01-06 LAB — TSH: TSH: 2.16 u[IU]/mL (ref 0.450–4.500)

## 2020-01-07 DIAGNOSIS — M549 Dorsalgia, unspecified: Secondary | ICD-10-CM | POA: Diagnosis not present

## 2020-01-08 DIAGNOSIS — M549 Dorsalgia, unspecified: Secondary | ICD-10-CM | POA: Diagnosis not present

## 2020-01-09 DIAGNOSIS — M549 Dorsalgia, unspecified: Secondary | ICD-10-CM | POA: Diagnosis not present

## 2020-01-10 ENCOUNTER — Other Ambulatory Visit: Payer: Self-pay

## 2020-01-10 ENCOUNTER — Other Ambulatory Visit: Payer: Self-pay | Admitting: Family Medicine

## 2020-01-10 DIAGNOSIS — M549 Dorsalgia, unspecified: Secondary | ICD-10-CM | POA: Diagnosis not present

## 2020-01-10 DIAGNOSIS — N189 Chronic kidney disease, unspecified: Secondary | ICD-10-CM

## 2020-01-10 DIAGNOSIS — E785 Hyperlipidemia, unspecified: Secondary | ICD-10-CM

## 2020-01-10 DIAGNOSIS — D649 Anemia, unspecified: Secondary | ICD-10-CM

## 2020-01-10 DIAGNOSIS — E1169 Type 2 diabetes mellitus with other specified complication: Secondary | ICD-10-CM

## 2020-01-10 MED ORDER — ERGOCALCIFEROL 1.25 MG (50000 UT) PO CAPS: 50000.0000 [IU] | ORAL_CAPSULE | ORAL | 2 refills | Status: DC

## 2020-01-10 MED ORDER — ROSUVASTATIN CALCIUM 5 MG PO TABS: 5.0000 mg | ORAL_TABLET | Freq: Every day | ORAL | 3 refills | Status: DC

## 2020-01-11 DIAGNOSIS — M549 Dorsalgia, unspecified: Secondary | ICD-10-CM | POA: Diagnosis not present

## 2020-01-12 DIAGNOSIS — M549 Dorsalgia, unspecified: Secondary | ICD-10-CM | POA: Diagnosis not present

## 2020-01-13 DIAGNOSIS — M549 Dorsalgia, unspecified: Secondary | ICD-10-CM | POA: Diagnosis not present

## 2020-01-14 DIAGNOSIS — M549 Dorsalgia, unspecified: Secondary | ICD-10-CM | POA: Diagnosis not present

## 2020-01-15 DIAGNOSIS — M549 Dorsalgia, unspecified: Secondary | ICD-10-CM | POA: Diagnosis not present

## 2020-01-16 DIAGNOSIS — M549 Dorsalgia, unspecified: Secondary | ICD-10-CM | POA: Diagnosis not present

## 2020-01-17 ENCOUNTER — Encounter (HOSPITAL_COMMUNITY): Payer: Self-pay

## 2020-01-17 ENCOUNTER — Other Ambulatory Visit: Payer: Self-pay

## 2020-01-17 ENCOUNTER — Emergency Department (HOSPITAL_COMMUNITY): Payer: Medicaid Other

## 2020-01-17 ENCOUNTER — Observation Stay (HOSPITAL_COMMUNITY)
Admission: EM | Admit: 2020-01-17 | Discharge: 2020-01-18 | Disposition: A | Discharge status: Home / Self Care | Payer: Medicaid Other | Attending: Internal Medicine | Admitting: Internal Medicine

## 2020-01-17 ENCOUNTER — Encounter (INDEPENDENT_AMBULATORY_CARE_PROVIDER_SITE_OTHER): Payer: Self-pay | Admitting: *Deleted

## 2020-01-17 ENCOUNTER — Observation Stay (HOSPITAL_COMMUNITY): Payer: Medicaid Other

## 2020-01-17 DIAGNOSIS — Z79899 Other long term (current) drug therapy: Secondary | ICD-10-CM | POA: Insufficient documentation

## 2020-01-17 DIAGNOSIS — I10 Essential (primary) hypertension: Secondary | ICD-10-CM | POA: Diagnosis not present

## 2020-01-17 DIAGNOSIS — Z7984 Long term (current) use of oral hypoglycemic drugs: Secondary | ICD-10-CM | POA: Insufficient documentation

## 2020-01-17 DIAGNOSIS — Z20822 Contact with and (suspected) exposure to covid-19: Secondary | ICD-10-CM | POA: Insufficient documentation

## 2020-01-17 DIAGNOSIS — E860 Dehydration: Secondary | ICD-10-CM

## 2020-01-17 DIAGNOSIS — R059 Cough, unspecified: Secondary | ICD-10-CM

## 2020-01-17 DIAGNOSIS — R112 Nausea with vomiting, unspecified: Secondary | ICD-10-CM | POA: Diagnosis not present

## 2020-01-17 DIAGNOSIS — Z8616 Personal history of COVID-19: Secondary | ICD-10-CM | POA: Insufficient documentation

## 2020-01-17 DIAGNOSIS — Z7982 Long term (current) use of aspirin: Secondary | ICD-10-CM | POA: Diagnosis not present

## 2020-01-17 DIAGNOSIS — N179 Acute kidney failure, unspecified: Secondary | ICD-10-CM | POA: Diagnosis not present

## 2020-01-17 DIAGNOSIS — U071 COVID-19: Secondary | ICD-10-CM | POA: Diagnosis not present

## 2020-01-17 DIAGNOSIS — Z87891 Personal history of nicotine dependence: Secondary | ICD-10-CM | POA: Insufficient documentation

## 2020-01-17 DIAGNOSIS — N1832 Chronic kidney disease, stage 3b: Secondary | ICD-10-CM

## 2020-01-17 DIAGNOSIS — M549 Dorsalgia, unspecified: Secondary | ICD-10-CM | POA: Diagnosis not present

## 2020-01-17 DIAGNOSIS — E1140 Type 2 diabetes mellitus with diabetic neuropathy, unspecified: Secondary | ICD-10-CM | POA: Diagnosis not present

## 2020-01-17 DIAGNOSIS — E1165 Type 2 diabetes mellitus with hyperglycemia: Secondary | ICD-10-CM | POA: Diagnosis present

## 2020-01-17 LAB — CBC WITH DIFFERENTIAL/PLATELET
Abs Immature Granulocytes: 0.04 10*3/uL (ref 0.00–0.07)
Basophils Absolute: 0 10*3/uL (ref 0.0–0.1)
Basophils Relative: 1 %
Eosinophils Absolute: 0.1 10*3/uL (ref 0.0–0.5)
Eosinophils Relative: 1 %
HCT: 32.5 % — ABNORMAL LOW (ref 36.0–46.0)
Hemoglobin: 10.5 g/dL — ABNORMAL LOW (ref 12.0–15.0)
Immature Granulocytes: 1 %
Lymphocytes Relative: 41 %
Lymphs Abs: 3.6 10*3/uL (ref 0.7–4.0)
MCH: 29.7 pg (ref 26.0–34.0)
MCHC: 32.3 g/dL (ref 30.0–36.0)
MCV: 92.1 fL (ref 80.0–100.0)
Monocytes Absolute: 0.8 10*3/uL (ref 0.1–1.0)
Monocytes Relative: 9 %
Neutro Abs: 4.3 10*3/uL (ref 1.7–7.7)
Neutrophils Relative %: 47 %
Platelets: 152 10*3/uL (ref 150–400)
RBC: 3.53 MIL/uL — ABNORMAL LOW (ref 3.87–5.11)
RDW: 13 % (ref 11.5–15.5)
WBC: 8.8 10*3/uL (ref 4.0–10.5)
nRBC: 0 % (ref 0.0–0.2)

## 2020-01-17 LAB — URINALYSIS, ROUTINE W REFLEX MICROSCOPIC
Bilirubin Urine: NEGATIVE
Glucose, UA: NEGATIVE mg/dL
Ketones, ur: NEGATIVE mg/dL
Nitrite: NEGATIVE
Protein, ur: >=300 mg/dL — AB
Specific Gravity, Urine: 1.011 (ref 1.005–1.030)
pH: 6 (ref 5.0–8.0)

## 2020-01-17 LAB — COMPREHENSIVE METABOLIC PANEL
ALT: 17 U/L (ref 0–44)
AST: 23 U/L (ref 15–41)
Albumin: 2.5 g/dL — ABNORMAL LOW (ref 3.5–5.0)
Alkaline Phosphatase: 74 U/L (ref 38–126)
Anion gap: 10 (ref 5–15)
BUN: 30 mg/dL — ABNORMAL HIGH (ref 6–20)
CO2: 21 mmol/L — ABNORMAL LOW (ref 22–32)
Calcium: 8.1 mg/dL — ABNORMAL LOW (ref 8.9–10.3)
Chloride: 107 mmol/L (ref 98–111)
Creatinine, Ser: 2.27 mg/dL — ABNORMAL HIGH (ref 0.44–1.00)
GFR, Estimated: 25 mL/min — ABNORMAL LOW (ref >60)
Glucose, Bld: 70 mg/dL (ref 70–99)
Potassium: 3.6 mmol/L (ref 3.5–5.1)
Sodium: 138 mmol/L (ref 135–145)
Total Bilirubin: 0.8 mg/dL (ref 0.3–1.2)
Total Protein: 6.3 g/dL — ABNORMAL LOW (ref 6.5–8.1)

## 2020-01-17 LAB — RESPIRATORY PANEL BY PCR

## 2020-01-17 LAB — RAPID URINE DRUG SCREEN, HOSP PERFORMED
Amphetamines: NOT DETECTED
Barbiturates: NOT DETECTED
Benzodiazepines: NOT DETECTED
Cocaine: NOT DETECTED
Opiates: NOT DETECTED
Tetrahydrocannabinol: NOT DETECTED

## 2020-01-17 LAB — RESPIRATORY PANEL BY RT PCR (FLU A&B, COVID)
Influenza A by PCR: NEGATIVE
Influenza B by PCR: NEGATIVE
SARS Coronavirus 2 by RT PCR: NEGATIVE

## 2020-01-17 LAB — SODIUM, URINE, RANDOM: Sodium, Ur: 51 mmol/L

## 2020-01-17 LAB — CBG MONITORING, ED: Glucose-Capillary: 56 mg/dL — ABNORMAL LOW (ref 70–99)

## 2020-01-17 LAB — LIPASE, BLOOD: Lipase: 30 U/L (ref 11–51)

## 2020-01-17 LAB — CREATININE, URINE, RANDOM: Creatinine, Urine: 88.61 mg/dL

## 2020-01-17 MED ORDER — PHENOL 1.4 % MT LIQD
1.0000 | OROMUCOSAL | Status: DC | PRN
  Filled 2020-01-17: qty 177

## 2020-01-17 MED ORDER — HEPARIN SODIUM (PORCINE) 5000 UNIT/ML IJ SOLN
5000.0000 [IU] | Freq: Three times a day (TID) | INTRAMUSCULAR | Status: DC
  Administered 2020-01-17 – 2020-01-18 (×2): 5000 [IU] via SUBCUTANEOUS
  Filled 2020-01-17 (×3): qty 1

## 2020-01-17 MED ORDER — SODIUM CHLORIDE 0.9 % IV BOLUS
1000.0000 mL | Freq: Once | INTRAVENOUS | Status: AC
  Administered 2020-01-17: 1000 mL via INTRAVENOUS

## 2020-01-17 MED ORDER — INSULIN ASPART 100 UNIT/ML ~~LOC~~ SOLN
0.0000 [IU] | Freq: Three times a day (TID) | SUBCUTANEOUS | Status: DC
  Administered 2020-01-18: 2 [IU] via SUBCUTANEOUS
  Administered 2020-01-18: 1 [IU] via SUBCUTANEOUS

## 2020-01-17 MED ORDER — IPRATROPIUM-ALBUTEROL 0.5-2.5 (3) MG/3ML IN SOLN
3.0000 mL | Freq: Three times a day (TID) | RESPIRATORY_TRACT | Status: DC
  Administered 2020-01-18 (×2): 3 mL via RESPIRATORY_TRACT
  Filled 2020-01-17 (×2): qty 3

## 2020-01-17 MED ORDER — ROSUVASTATIN CALCIUM 10 MG PO TABS
5.0000 mg | ORAL_TABLET | Freq: Every day | ORAL | Status: DC
  Administered 2020-01-17 – 2020-01-18 (×2): 5 mg via ORAL
  Filled 2020-01-17 (×2): qty 1

## 2020-01-17 MED ORDER — GLIPIZIDE ER 5 MG PO TB24
5.0000 mg | ORAL_TABLET | Freq: Every day | ORAL | Status: DC
  Administered 2020-01-18: 5 mg via ORAL
  Filled 2020-01-17: qty 1

## 2020-01-17 MED ORDER — ONDANSETRON HCL 4 MG/2ML IJ SOLN
4.0000 mg | Freq: Once | INTRAMUSCULAR | Status: AC
  Administered 2020-01-17: 4 mg via INTRAVENOUS
  Filled 2020-01-17: qty 2

## 2020-01-17 MED ORDER — AMLODIPINE BESYLATE 5 MG PO TABS
5.0000 mg | ORAL_TABLET | Freq: Every day | ORAL | Status: DC
  Administered 2020-01-17 – 2020-01-18 (×2): 5 mg via ORAL
  Filled 2020-01-17 (×2): qty 1

## 2020-01-17 MED ORDER — GUAIFENESIN-DM 100-10 MG/5ML PO SYRP
5.0000 mL | ORAL_SOLUTION | ORAL | Status: DC | PRN
  Administered 2020-01-18: 5 mL via ORAL
  Filled 2020-01-17: qty 5

## 2020-01-17 MED ORDER — HYDROCOD POLST-CPM POLST ER 10-8 MG/5ML PO SUER
5.0000 mL | Freq: Two times a day (BID) | ORAL | Status: DC | PRN
  Administered 2020-01-18: 5 mL via ORAL
  Filled 2020-01-17: qty 5

## 2020-01-17 MED ORDER — IPRATROPIUM-ALBUTEROL 0.5-2.5 (3) MG/3ML IN SOLN
3.0000 mL | RESPIRATORY_TRACT | Status: DC
  Administered 2020-01-17 (×2): 3 mL via RESPIRATORY_TRACT
  Filled 2020-01-17 (×2): qty 3

## 2020-01-17 MED ORDER — ONDANSETRON HCL 4 MG PO TABS: 4.0000 mg | ORAL_TABLET | Freq: Four times a day (QID) | ORAL | Status: DC | PRN

## 2020-01-17 MED ORDER — INSULIN ASPART 100 UNIT/ML ~~LOC~~ SOLN: 0.0000 [IU] | Freq: Every day | SUBCUTANEOUS | Status: DC

## 2020-01-17 MED ORDER — SODIUM CHLORIDE 0.9 % IV SOLN: INTRAVENOUS | Status: DC

## 2020-01-17 MED ORDER — GUAIFENESIN ER 600 MG PO TB12
1200.0000 mg | ORAL_TABLET | Freq: Two times a day (BID) | ORAL | Status: DC
  Administered 2020-01-17 – 2020-01-18 (×2): 1200 mg via ORAL
  Filled 2020-01-17 (×2): qty 2

## 2020-01-17 MED ORDER — ACETAMINOPHEN 650 MG RE SUPP: 650.0000 mg | Freq: Four times a day (QID) | RECTAL | Status: DC | PRN

## 2020-01-17 MED ORDER — ONDANSETRON HCL 4 MG/2ML IJ SOLN: 4.0000 mg | Freq: Four times a day (QID) | INTRAMUSCULAR | Status: DC | PRN

## 2020-01-17 MED ORDER — VITAMIN D (ERGOCALCIFEROL) 1.25 MG (50000 UNIT) PO CAPS: 50000.0000 [IU] | ORAL_CAPSULE | ORAL | Status: DC

## 2020-01-17 MED ORDER — ACETAMINOPHEN 325 MG PO TABS: 650.0000 mg | ORAL_TABLET | Freq: Four times a day (QID) | ORAL | Status: DC | PRN

## 2020-01-17 MED ORDER — ASPIRIN EC 81 MG PO TBEC
81.0000 mg | DELAYED_RELEASE_TABLET | Freq: Every day | ORAL | Status: DC
  Administered 2020-01-17 – 2020-01-18 (×2): 81 mg via ORAL
  Filled 2020-01-17 (×2): qty 1

## 2020-01-17 NOTE — ED Provider Notes (Signed)
[facility] EMERGENCY DEPARTMENT Provider Note   CSN: [id number] Arrival date & time: 01/17/20  1123     History Chief Complaint  Patient presents with  . Cough  . Emesis    Latoya Walsh is a 59 y.o. female.  HPI Is a 59 year old female with past medical history significant for anxiety, depression, chronic hep C, DM 2, HTN, nicotine dependence, substance abuse.  She is presented today with complaints of cough over the past 3 or 4 days.  She states she has had cold-like symptoms with some congestion and fatigue.  She states that she has been vomiting for the past 3 days as well she states was more frequent last night.  She states that "I was vomiting all night "but is unable to tell me how many times exactly although she is certain that it was greater than 10 times.  She denies any hematemesis.  Denies any lightheadedness or dizziness.  No chest pain or shortness of breath.  She does state that she feels some discomfort in her abdomen especially after she vomits.  She denies any vaginal discharge, urinary symptoms dysuria frequency urgency.  I asked patient about her past medical history of hepatitis B however she states that "I was treated for this ".  She was diagnosed with Covid 1 month ago she states that she felt better after this and states that her cough is new.  No hemoptysis chest pain history of VTE, hormone therapy, recent hospitalization or immobilization.  No lower extremities pain or unilateral swelling.     Past Medical History:  Diagnosis Date  . Allergic rhinitis   . Anxiety   . Chronic back pain   . Chronic hepatitis C without hepatic coma ([place])   . Depression   . Diabetes mellitus   . Hepatitis C   . Hypertension   . Noncompliance   . Poor appetite 07/17/2014  . Substance abuse ([place])    HX of drug use and alcohol use    Patient Active Problem List   Diagnosis Date Noted  . COVID-19 virus infection 12/26/2019  . Counseled about COVID-19 virus  infection 10/06/2019  . Vaginal discharge 08/30/2019  . Depression, major, single episode, in partial remission ([place]) 01/25/2019  . Headache 11/13/2018  . Insomnia 09/25/2018  . Annual physical exam 07/23/2016  . Lumbar back pain with radiculopathy affecting right lower extremity 03/27/2015  . Non compliance with medical treatment 07/22/2014  . Chronic hepatitis C without hepatic coma ([place]) 12/04/2013  . Vaginitis and vulvovaginitis 11/23/2013  . Allergic rhinitis 11/03/2012  . HTN, goal below 130/80 06/25/2011  . Anxiety and depression 02/05/2011  . Nicotine dependence 11/01/2010  . Drug dependence, continuous abuse ([place]) 03/22/2010  . Controlled type 2 diabetes with neuropathy ([place]) 06/02/2007  . Alcohol abuse 06/02/2007    Past Surgical History:  Procedure Laterality Date  . APPENDECTOMY  2004     OB History   No obstetric history on file.     Family History  Problem Relation Age of Onset  . Diabetes Sister        Twin - AIDS     Social History   Tobacco Use  . Smoking status: Former Smoker    Packs/day: 0.25    Types: Cigarettes  . Smokeless tobacco: Never Used  Substance Use Topics  . Alcohol use: Yes    Alcohol/week: 40.0 standard drinks    Types: 40 Cans of beer per week    Comment: 2 16oz cans of  beer a day  . Drug use: Not Currently    Types: Marijuana, Cocaine    Comment: Last smoked marijuana yesterday 10/18/14    Home Medications Prior to Admission medications   Medication Sig Start Date End Date Taking? Authorizing Provider  amLODipine (NORVASC) 5 MG tablet TAKE ONE TABLET BY MOUTH ONCE DAILY. Patient taking differently: Take 5 mg by mouth daily.  11/23/19  Yes [name], MD  aspirin EC 81 MG tablet Take 1 tablet (81 mg total) by mouth daily. 04/04/19  Yes [name], MD  glipiZIDE (GLUCOTROL XL) 5 MG 24 hr tablet Take 1 tablet (5 mg total) by mouth daily with breakfast. 04/04/19  Yes [name], MD  blood glucose meter  kit and supplies Dispense based on patient and insurance preference. Once daily testing dx E11.9. 09/20/18   [name], MD  ergocalciferol (VITAMIN D2) 1.25 MG (50000 UT) capsule Take 1 capsule (50,000 Units total) by mouth once a week. One capsule once weekly 01/10/20   [name], MD  FLUoxetine (PROZAC) 20 MG tablet Take 1 tablet (20 mg total) by mouth daily. Patient not taking: Reported on 01/17/2020 01/24/19   [name], MD  rosuvastatin (CRESTOR) 5 MG tablet Take 1 tablet (5 mg total) by mouth daily. 01/10/20   [name], MD    Allergies    Penicillins  Review of Systems   Review of Systems  Constitutional: Positive for fatigue. Negative for chills and fever.  HENT: Negative for congestion.   Eyes: Negative for pain.  Respiratory: Positive for cough. Negative for shortness of breath.   Cardiovascular: Negative for chest pain and leg swelling.  Gastrointestinal: Positive for abdominal pain, nausea and vomiting. Negative for constipation and diarrhea.  Genitourinary: Negative for dysuria.  Musculoskeletal: Negative for myalgias.  Skin: Negative for rash.  Neurological: Negative for dizziness and headaches.    Physical Exam Updated Vital Signs BP 138/75 (BP Location: Left Arm)   Pulse (!) 102   Temp 98.3 F (36.8 C) (Oral)   Resp 18   Wt 66.7 kg   LMP 06/23/2010   SpO2 94%   BMI 23.02 kg/m   Physical Exam Vitals and nursing note reviewed.  Constitutional:      General: She is not in acute distress.    Comments: 59 year old.  In no acute distress.  Sitting comfortably in bed.  Able answer questions appropriately follow commands. No increased work of breathing. Speaking in full sentences.   HENT:     Head: Normocephalic and atraumatic.     Nose: Nose normal.     Mouth/Throat:     Mouth: Mucous membranes are moist.  Eyes:     General: No scleral icterus. Cardiovascular:     Rate and Rhythm: Normal rate and regular rhythm.      Pulses: Normal pulses.     Heart sounds: Normal heart sounds.  Pulmonary:     Effort: Pulmonary effort is normal. No respiratory distress.     Breath sounds: No wheezing.  Abdominal:     Palpations: Abdomen is soft.     Tenderness: There is no abdominal tenderness. There is no right CVA tenderness, left CVA tenderness, guarding or rebound.     Comments: Non-tender abd  Musculoskeletal:     Cervical back: Normal range of motion.     Right lower leg: No edema.     Left lower leg: No edema.  Skin:    General: Skin is warm  and dry.     Capillary Refill: Capillary refill takes less than 2 seconds.  Neurological:     Mental Status: She is alert. Mental status is at baseline.  Psychiatric:        Mood and Affect: Mood normal.        Behavior: Behavior normal.     ED Results / Procedures / Treatments   Labs (all labs ordered are listed, but only abnormal results are displayed) Labs Reviewed  COMPREHENSIVE METABOLIC PANEL - Abnormal; Notable for the following components:      Result Value   CO2 21 (*)    BUN 30 (*)    Creatinine, Ser 2.27 (*)    Calcium 8.1 (*)    Total Protein 6.3 (*)    Albumin 2.5 (*)    GFR, Estimated 25 (*)    All other components within normal limits  CBC WITH DIFFERENTIAL/PLATELET - Abnormal; Notable for the following components:   RBC 3.53 (*)    Hemoglobin 10.5 (*)    HCT 32.5 (*)    All other components within normal limits  RESPIRATORY PANEL BY RT PCR (FLU A&B, COVID)  LIPASE, BLOOD  RAPID URINE DRUG SCREEN, HOSP PERFORMED  URINALYSIS, ROUTINE W REFLEX MICROSCOPIC    EKG None  Radiology DG Chest 2 View  Result Date: 01/17/2020 CLINICAL DATA:  Cough, vomiting, tachycardia, COVID positive 12/16/2019 EXAM: CHEST - 2 VIEW COMPARISON:  05/15/2011 chest radiograph. FINDINGS: Stable cardiomediastinal silhouette with normal heart size. No pneumothorax. No pleural effusion. Moderate patchy hazy opacities throughout both lungs, most prominent in right  lower lung, new. IMPRESSION: New moderate patchy hazy opacities throughout both lungs, most prominent in the right lower lung, compatible with COVID-19 pneumonia. Electronically Signed   By: Ilona Sorrel M.D.   On: 01/17/2020 14:24    Procedures Procedures (including critical care time)  Medications Ordered in ED Medications  sodium chloride 0.9 % bolus 1,000 mL (1,000 mLs Intravenous New Bag/Given 01/17/20 1427)  ondansetron (ZOFRAN) injection 4 mg (4 mg Intravenous Given 01/17/20 1428)  sodium chloride 0.9 % bolus 1,000 mL (1,000 mLs Intravenous New Bag/Given 01/17/20 1536)    ED Course  I have reviewed the triage vital signs and the nursing notes.  Pertinent labs & imaging results that were available during my care of the patient were reviewed by me and considered in my medical decision making (see chart for details).  Patient is 59 year old female with past medical history detailed above presented today for nausea vomiting for the past 3 days she also has been having cough.  She was diagnosed with Covid a month ago.  She is recovered from this.  On physical exam she appears significantly dehydrated is mildly tachycardic she is afebrile and her oxygen saturations are within normal limits.  She has no tenderness of her abdomen.  No evidence of any intracranial abnormality causing her nausea and vomiting today.  She is neurologically intact.  Creatinine with notable prerenal AKI.  No other electrolyte abnormalities. CBC without leukocytosis there is mild anemia of 10.5.  She is not having any lightheadedness dizziness.  I doubt her tachycardia is related to this. Lipase within normal is at pancreatitis. Covid test negative today.  Chest x-ray shows likely improving Covid vestiges however no comparison her lungs are clear on my examination.  She is received 2 L normal saline.  Clinical Course as of Jan 17 1648  Wed Jan 17, 2020  1525 Reassessed at this time. Pt states improvement in  nausea but states she still feels fatigued.   [WF]  8638 Patient with creatinine of 2.27 today.  He had a creatinine of 1.92 12 days ago it appears that patient has a baseline creatinine of around 1.  Does have diabetes and hypertension.   [WF]    Clinical Course User Index [WF] Tedd Sias, [place]   I discussed this case with my attending physician who cosigned this note including patient's presenting symptoms, physical exam, and planned diagnostics and interventions. Attending physician stated agreement with plan or made changes to plan which were implemented.    MDM Rules/Calculators/A&P                          Patient admitted for AKI.  Final Clinical Impression(s) / ED Diagnoses Final diagnoses:  AKI (acute kidney injury) ([place])  Non-intractable vomiting with nausea, unspecified vomiting type  Cough  Dehydration    Rx / DC Orders ED Discharge Orders    None       Tedd Sias, [place] 01/17/20 1649    Fredia Sorrow, MD 01/19/20 2202

## 2020-01-17 NOTE — H&P (Signed)
[facility] H&P   Patient Demographics:    Latoya Walsh, is a 59 y.o. female  MRN: [id number]   DOB - November 07, 1960  Admit Date - 01/17/2020  Outpatient Primary MD for the patient is [name], MD  Referring MD/NP/PA: Conception Chancy  Patient coming from: Home  Chief Complaint  Patient presents with  . Cough  . Emesis      HPI:    Latoya Walsh  is a 59 y.o. female, with past medical history of anxiety, depression, chronic hep C, diabetes mellitus type 2, hypertension, nicotine dependence, recent COVID-19 infection, patient presents to ED secondary to complaints of cough over the last 3 to 4 days, as well as some cold-like symptoms include nasal congestion, fatigue, as well reports her cough has been persistent intermittently where it did cause her to have had some vomiting as well, she denies any fever, chills, diarrhea, abdominal pain, no dysuria, polyuria, no hemoptysis, notes with COVID-19 a month ago, but reports her symptoms has totally resolved, and this new CHF symptoms only for last 3 days. -In ED her work-up was significant for negative COVID-19 test, chest x-ray significant for bilateral opacities, which is Ronalee Belts is likely residual from her previous COVID-19 infection, labs were significant for elevation of creatinine at 2.2, most recent was 1.92 weeks ago, no leukocytosis, hemoglobin at 10.5 which is around her baseline,.  Hospitalist were consulted to admit given worsening renal failure.    Review of systems:    In addition to the HPI above, No Fever-chills, reports fatigue and generalized weakness  no Headache, No changes with Vision or hearing, No problems swallowing food or Liquids, No Chest pain, reports cough, nasal congestion No Abdominal pain, Bowel movements are regular, report vomiting No Blood in stool or Urine, No dysuria, No new skin rashes or bruises, No  new joints pains-aches,  No new weakness, tingling, numbness in any extremity, No recent weight gain or loss, No polyuria, polydypsia or polyphagia, No significant Mental Stressors.  A full 10 point Review of Systems was done, except as stated above, all other Review of Systems were negative.   With Past History of the following :    Past Medical History:  Diagnosis Date  . Allergic rhinitis   . Anxiety   . Chronic back pain   . Chronic hepatitis C without hepatic coma ([place])   . Depression   . Diabetes mellitus   . Hepatitis C   . Hypertension   . Noncompliance   . Poor appetite 07/17/2014  . Substance abuse ([place])    HX of drug use and alcohol use      Past Surgical History:  Procedure Laterality Date  . APPENDECTOMY  2004      Social History:     Social History   Tobacco Use  . Smoking status: Former Smoker    Packs/day: 0.25  Types: Cigarettes  . Smokeless tobacco: Never Used  Substance Use Topics  . Alcohol use: Yes    Alcohol/week: 40.0 standard drinks    Types: 40 Cans of beer per week    Comment: 2 16oz cans of beer a day      Family History :     Family History  Problem Relation Age of Onset  . Diabetes Sister        Twin - AIDS      Home Medications:   Prior to Admission medications   Medication Sig Start Date End Date Taking? Authorizing Provider  amLODipine (NORVASC) 5 MG tablet TAKE ONE TABLET BY MOUTH ONCE DAILY. Patient taking differently: Take 5 mg by mouth daily.  11/23/19  Yes [name], MD  aspirin EC 81 MG tablet Take 1 tablet (81 mg total) by mouth daily. 04/04/19  Yes [name], MD  glipiZIDE (GLUCOTROL XL) 5 MG 24 hr tablet Take 1 tablet (5 mg total) by mouth daily with breakfast. 04/04/19  Yes [name], MD  blood glucose meter kit and supplies Dispense based on patient and insurance preference. Once daily testing dx E11.9. 09/20/18   [name], MD  ergocalciferol (VITAMIN D2) 1.25 MG (50000  UT) capsule Take 1 capsule (50,000 Units total) by mouth once a week. One capsule once weekly 01/10/20   [name], MD  FLUoxetine (PROZAC) 20 MG tablet Take 1 tablet (20 mg total) by mouth daily. Patient not taking: Reported on 01/17/2020 01/24/19   [name], MD  rosuvastatin (CRESTOR) 5 MG tablet Take 1 tablet (5 mg total) by mouth daily. 01/10/20   [name], MD     Allergies:     Allergies  Allergen Reactions  . Penicillins Itching and Rash     Physical Exam:   Vitals  Blood pressure 138/75, pulse (!) 102, temperature 98.3 F (36.8 C), temperature source Oral, resp. rate 18, weight 66.7 kg, last menstrual period 06/23/2010, SpO2 94 %.  1. General developed female, laying in bed, in no apparent distress  2. Normal affect and insight, Not Suicidal or Homicidal, Awake Alert, Oriented X 3.  3. No F.N deficits, ALL C.Nerves Intact, Strength 5/5 all 4 extremities, Sensation intact all 4 extremities, Plantars down going.  4. Ears and Eyes appear Normal, Conjunctivae clear, PERRLA. dry Oral Mucosa.  5. Supple Neck, No JVD, No cervical lymphadenopathy appriciated, No Carotid Bruits.  6. Symmetrical Chest wall movement, Good air movement bilaterally, CTAB.  7. RRR, No Gallops, Rubs or Murmurs, No Parasternal Heave.  8. Positive Bowel Sounds, Abdomen Soft, No tenderness, No organomegaly appriciated,No rebound -guarding or rigidity.  9.  No Cyanosis, delayed skin Turgor, No Skin Rash or Bruise.  10. Good muscle tone,  joints appear normal , no effusions, Normal ROM.  11. No Palpable Lymph Nodes in Neck or Axillae   Data Review:    CBC Recent Labs  Lab 01/17/20 1420  WBC 8.8  HGB 10.5*  HCT 32.5*  PLT 152  MCV 92.1  MCH 29.7  MCHC 32.3  RDW 13.0  LYMPHSABS 3.6  MONOABS 0.8  EOSABS 0.1  BASOSABS 0.0   ------------------------------------------------------------------------------------------------------------------  Chemistries    Recent Labs  Lab 01/17/20 1420  NA 138  K 3.6  CL 107  CO2 21*  GLUCOSE 70  BUN 30*  CREATININE 2.27*  CALCIUM 8.1*  AST 23  ALT 17  ALKPHOS 74  BILITOT 0.8   ------------------------------------------------------------------------------------------------------------------ estimated creatinine  clearance is 26.9 mL/min (A) (by C-G formula based on SCr of 2.27 mg/dL (H)). ------------------------------------------------------------------------------------------------------------------ No results for input(s): TSH, T4TOTAL, T3FREE, THYROIDAB in the last 72 hours.  Invalid input(s): FREET3  Coagulation profile No results for input(s): INR, PROTIME in the last 168 hours. ------------------------------------------------------------------------------------------------------------------- No results for input(s): DDIMER in the last 72 hours. -------------------------------------------------------------------------------------------------------------------  Cardiac Enzymes No results for input(s): CKMB, TROPONINI, MYOGLOBIN in the last 168 hours.  Invalid input(s): CK ------------------------------------------------------------------------------------------------------------------ No results found for: BNP   ---------------------------------------------------------------------------------------------------------------  Urinalysis    Component Value Date/Time   COLORURINE YELLOW 11/11/2016 1220   APPEARANCEUR HAZY (A) 11/11/2016 1220   LABSPEC 1.014 11/11/2016 1220   PHURINE 5.0 11/11/2016 1220   GLUCOSEU 50 (A) 11/11/2016 1220   HGBUR MODERATE (A) 11/11/2016 1220   BILIRUBINUR NEGATIVE 11/11/2016 1220   KETONESUR NEGATIVE 11/11/2016 1220   PROTEINUR 100 (A) 11/11/2016 1220   UROBILINOGEN 0.2 10/19/2014 1023   NITRITE NEGATIVE 11/11/2016 1220   LEUKOCYTESUR LARGE (A) 11/11/2016 1220     ----------------------------------------------------------------------------------------------------------------   Imaging Results:    DG Chest 2 View  Result Date: 01/17/2020 CLINICAL DATA:  Cough, vomiting, tachycardia, COVID positive 12/16/2019 EXAM: CHEST - 2 VIEW COMPARISON:  05/15/2011 chest radiograph. FINDINGS: Stable cardiomediastinal silhouette with normal heart size. No pneumothorax. No pleural effusion. Moderate patchy hazy opacities throughout both lungs, most prominent in right lower lung, new. IMPRESSION: New moderate patchy hazy opacities throughout both lungs, most prominent in the right lower lung, compatible with COVID-19 pneumonia. Electronically Signed   By: Ilona Sorrel M.D.   On: 01/17/2020 14:24     Assessment & Plan:    Active Problems:   Controlled type 2 diabetes with neuropathy ([facility])   HTN, goal below 130/80   AKI (acute kidney injury) ([place])  AKI on CKD3 -Pain is trending up, this is most likely volume depletion and dehydration, she will be started on IV fluids, but overall her creatinine has been trending up over last few month, especially with her known history of diabetes and hypertension, will check renal ultrasound, urine sodium and creatinine to calculate her FENa.  URI -She is with recent Covid, but symptoms have totally resolved, these are new or recurrent symptoms over last 3 days, her Covid/flu is negative, will check respiratory panel, she is in no respiratory distress, she will be started on Mucinex, cough medicine, and as needed nebs  Hypertension -Continue with home medications  Diabetes mellitus -Resume home dose glipizide, will add sliding scale during hospital stay, and will check A1c  Hyperlipidemia -Continue with statin   DVT Prophylaxis Heparin  AM Labs Ordered, also please review Full Orders  Family Communication: Admission, patients condition and plan of care including tests being ordered have been discussed with the patient  and sister by phone who indicate understanding and agree with the plan and Code Status.  Code Status Full  Likely DC to  home  Condition GUARDED    Consults called: none    Admission status: observation    Time spent in minutes : 55 minutes   Phillips Climes M.D on 01/17/2020 at 5:00 PM   [facility] - Office  [phone]

## 2020-01-17 NOTE — ED Notes (Signed)
Juice and crackers given for blood sugar. Tray ordered

## 2020-01-17 NOTE — ED Notes (Signed)
Pt given cup of water and warm blanket ? ?

## 2020-01-17 NOTE — ED Notes (Signed)
Pt given cup of diet ginger ale and warm blanket; pt resting quietly in bed, no distress noted at this time

## 2020-01-17 NOTE — ED Triage Notes (Signed)
Pt to er, pt states that she has a cough and vomiting for the past three days, states that she has a cold

## 2020-01-17 NOTE — ED Notes (Signed)
Pt given emesis bag upon arrival to room

## 2020-01-18 DIAGNOSIS — R059 Cough, unspecified: Secondary | ICD-10-CM

## 2020-01-18 DIAGNOSIS — N1832 Chronic kidney disease, stage 3b: Secondary | ICD-10-CM

## 2020-01-18 DIAGNOSIS — I10 Essential (primary) hypertension: Secondary | ICD-10-CM | POA: Diagnosis not present

## 2020-01-18 DIAGNOSIS — E1140 Type 2 diabetes mellitus with diabetic neuropathy, unspecified: Secondary | ICD-10-CM | POA: Diagnosis not present

## 2020-01-18 DIAGNOSIS — E860 Dehydration: Secondary | ICD-10-CM

## 2020-01-18 DIAGNOSIS — M549 Dorsalgia, unspecified: Secondary | ICD-10-CM | POA: Diagnosis not present

## 2020-01-18 DIAGNOSIS — N179 Acute kidney failure, unspecified: Secondary | ICD-10-CM | POA: Diagnosis not present

## 2020-01-18 LAB — BASIC METABOLIC PANEL
Anion gap: 8 (ref 5–15)
BUN: 28 mg/dL — ABNORMAL HIGH (ref 6–20)
CO2: 20 mmol/L — ABNORMAL LOW (ref 22–32)
Calcium: 7.3 mg/dL — ABNORMAL LOW (ref 8.9–10.3)
Chloride: 108 mmol/L (ref 98–111)
Creatinine, Ser: 2.2 mg/dL — ABNORMAL HIGH (ref 0.44–1.00)
GFR, Estimated: 26 mL/min — ABNORMAL LOW (ref >60)
Glucose, Bld: 170 mg/dL — ABNORMAL HIGH (ref 70–99)
Potassium: 4.1 mmol/L (ref 3.5–5.1)
Sodium: 136 mmol/L (ref 135–145)

## 2020-01-18 LAB — CBC
HCT: 28.9 % — ABNORMAL LOW (ref 36.0–46.0)
Hemoglobin: 9.1 g/dL — ABNORMAL LOW (ref 12.0–15.0)
MCH: 30.3 pg (ref 26.0–34.0)
MCHC: 31.5 g/dL (ref 30.0–36.0)
MCV: 96.3 fL (ref 80.0–100.0)
Platelets: 121 10*3/uL — ABNORMAL LOW (ref 150–400)
RBC: 3 MIL/uL — ABNORMAL LOW (ref 3.87–5.11)
RDW: 13.2 % (ref 11.5–15.5)
WBC: 12.5 10*3/uL — ABNORMAL HIGH (ref 4.0–10.5)
nRBC: 0 % (ref 0.0–0.2)

## 2020-01-18 LAB — HEMOGLOBIN A1C
Hgb A1c MFr Bld: 6.8 % — ABNORMAL HIGH (ref 4.8–5.6)
Mean Plasma Glucose: 148 mg/dL

## 2020-01-18 LAB — HIV ANTIBODY (ROUTINE TESTING W REFLEX): HIV Screen 4th Generation wRfx: NONREACTIVE

## 2020-01-18 LAB — GLUCOSE, CAPILLARY
Glucose-Capillary: 193 mg/dL — ABNORMAL HIGH (ref 70–99)
Glucose-Capillary: 132 mg/dL — ABNORMAL HIGH (ref 70–99)

## 2020-01-18 MED ORDER — GUAIFENESIN-DM 100-10 MG/5ML PO SYRP: 5.0000 mL | ORAL_SOLUTION | Freq: Four times a day (QID) | ORAL | 0 refills | Status: DC | PRN

## 2020-01-18 MED ORDER — IPRATROPIUM-ALBUTEROL 20-100 MCG/ACT IN AERS: 1.0000 | INHALATION_SPRAY | Freq: Four times a day (QID) | RESPIRATORY_TRACT | 1 refills | Status: DC | PRN

## 2020-01-18 MED ORDER — LORATADINE 10 MG PO TABS: 10.0000 mg | ORAL_TABLET | Freq: Every day | ORAL | 1 refills | Status: DC

## 2020-01-18 MED ORDER — FLUTICASONE PROPIONATE 50 MCG/ACT NA SUSP: 1.0000 | Freq: Every day | NASAL | 1 refills | Status: DC

## 2020-01-18 NOTE — Discharge Summary (Signed)
Physician Discharge Summary  Latoya Walsh TML:[id number] DOB: Jan 30, 1961 DOA: 01/17/2020  PCP: [name], MD  Admit date: 01/17/2020 Discharge date: 01/18/2020  Time spent: 35 minutes  Recommendations for Outpatient Follow-up:  1. Repeat basic metabolic panel to follow twice renal function 2. Reassess blood pressure and adjust antihypertensive treatment as needed. 3. Follow CBG closely and patient's A1c with further adjustment to hypoglycemic regimen as required.   Discharge Diagnoses:  Active Problems:   Controlled type 2 diabetes with neuropathy (HCC)   HTN, goal below 130/80   Acute renal failure superimposed on stage 3b chronic kidney disease (HCC)   Cough   Dehydration   Discharge Condition: Stable and improved.  Discharged home with instructions to follow-up with PCP in 10 days.  CODE STATUS: Full code.  Diet recommendation: Heart healthy modified carbohydrate diet.  Filed Weights   01/17/20 1143  Weight: 66.7 kg    History of present illness:  As per H&P written by Dr. Waldron Labs on 01/17/2020  Latoya Walsh  is a 59 y.o. female, with past medical history of anxiety, depression, chronic hep C, diabetes mellitus type 2, hypertension, nicotine dependence, recent COVID-19 infection, patient presents to ED secondary to complaints of cough over the last 3 to 4 days, as well as some cold-like symptoms include nasal congestion, fatigue, as well reports her cough has been persistent intermittently where it did cause her to have had some vomiting as well, she denies any fever, chills, diarrhea, abdominal pain, no dysuria, polyuria, no hemoptysis, notes with COVID-19 a month ago, but reports her symptoms has totally resolved since then, and this new URI symptoms has been only present for the last 3-4 days. -In ED her work-up was significant for negative COVID-19 test, chest x-ray significant for bilateral opacities, which is most likely residual from her previous  COVID-19 infection, labs were significant for elevation of creatinine at 2.2, most recent was 1.92 weeks ago, no leukocytosis, hemoglobin at 10.5 which is around her baseline,.  Hospitalist were consulted to admit given worsening renal failure.  Hospital Course:  1-acute kidney injury chronic kidney disease a stage IIIb -In the setting of dehydration and the use of nephrotoxic agents. -Nephrotoxic agent has been placed on hold -Patient advised to maintain adequate hydration -At the fluid resuscitation creatinine trending down and essentially at baseline at time of discharge -Will recommend repeat basic metabolic panel to follow electrolytes and renal function at follow-up visit. -Patient encouraged to maintain adequate hydration.  2-essential hypertension -Resume home antihypertensive agents -Advised to follow heart healthy diet.  3-type 2 diabetes mellitus -Resume home oral hypoglycemic agents and instruction to follow modified carbohydrate diet -Close CBG on A1c to be followed as an outpatient with further adjustment to hypoglycemic regimen as required.  4-URI symptoms -Negative Covid test -Respiratory panel demonstrating no influenza. -Will treat symptomatically with the use of Mucinex, Flonase, as needed albuterol inhaler.  5-hyperlipidemia -Continue statins  6-depression -No suicidal ideation or hallucination -Continue home antidepressant regimen.  Procedures: See below for x-ray reports  Consultations:  None  Discharge Exam: Vitals:   01/18/20 0620 01/18/20 0733  BP: 124/66   Pulse: 98   Resp: 18   Temp: 98.8 F (37.1 C)   SpO2: 99% 98%    General: Stable and improved.  No chest pain, no nausea, no vomiting.  Tolerating diet and with good oxygen saturation on room air. Cardiovascular: S1 and S2, no rubs, no gallops, no murmurs. Respiratory: Positive scattered rhonchi, no wheezing, no using  accessory muscle Abdomen: Soft, nontender, distended, positive bowel  sounds Extremities: No cyanosis or clubbing.  Discharge Instructions   Discharge Instructions    Diet - low sodium heart healthy   Complete by: As directed    Discharge instructions   Complete by: As directed    Take medications as prescribed Maintain adequate hydration Follow heart healthy/modified carbohydrate diet Arrange follow-up with PCP in 10 days.     Allergies as of 01/18/2020      Reactions   Penicillins Itching, Rash      Medication List    TAKE these medications   amLODipine 5 MG tablet Commonly known as: NORVASC TAKE ONE TABLET BY MOUTH ONCE DAILY.   aspirin EC 81 MG tablet Take 1 tablet (81 mg total) by mouth daily.   blood glucose meter kit and supplies Dispense based on patient and insurance preference. Once daily testing dx E11.9.   ergocalciferol 1.25 MG (50000 UT) capsule Commonly known as: VITAMIN D2 Take 1 capsule (50,000 Units total) by mouth once a week. One capsule once weekly   FLUoxetine 20 MG tablet Commonly known as: PROZAC Take 1 tablet (20 mg total) by mouth daily.   fluticasone 50 MCG/ACT nasal spray Commonly known as: Flonase Place 1 spray into both nostrils daily.   glipiZIDE 5 MG 24 hr tablet Commonly known as: GLUCOTROL XL Take 1 tablet (5 mg total) by mouth daily with breakfast.   guaiFENesin-dextromethorphan 100-10 MG/5ML syrup Commonly known as: ROBITUSSIN DM Take 5 mLs by mouth every 6 (six) hours as needed for cough.   Ipratropium-Albuterol 20-100 MCG/ACT Aers respimat Commonly known as: COMBIVENT Inhale 1 puff into the lungs every 6 (six) hours as needed for wheezing.   loratadine 10 MG tablet Commonly known as: Claritin Take 1 tablet (10 mg total) by mouth daily.   rosuvastatin 5 MG tablet Commonly known as: Crestor Take 1 tablet (5 mg total) by mouth daily.      Allergies  Allergen Reactions  . Penicillins Itching and Rash    Follow-up Information    [name], MD. Schedule an appointment as  soon as possible for a visit in 10 day(s).   Specialty: Family Medicine Contact information: [address], [place] [address] [phone]                The results of significant diagnostics from this hospitalization (including imaging, microbiology, ancillary and laboratory) are listed below for reference.    Significant Diagnostic Studies: DG Chest 2 View  Result Date: 01/17/2020 CLINICAL DATA:  Cough, vomiting, tachycardia, COVID positive 12/16/2019 EXAM: CHEST - 2 VIEW COMPARISON:  05/15/2011 chest radiograph. FINDINGS: Stable cardiomediastinal silhouette with normal heart size. No pneumothorax. No pleural effusion. Moderate patchy hazy opacities throughout both lungs, most prominent in right lower lung, new. IMPRESSION: New moderate patchy hazy opacities throughout both lungs, most prominent in the right lower lung, compatible with COVID-19 pneumonia. Electronically Signed   By: Ilona Sorrel M.D.   On: 01/17/2020 14:24   US RENAL  Result Date: 01/17/2020 CLINICAL DATA:  59 year old female with acute renal insufficiency. EXAM: RENAL / URINARY TRACT ULTRASOUND COMPLETE COMPARISON:  None. FINDINGS: Right Kidney: Renal measurements: 11.0 x 3.9 x 4.2 cm = volume: 94 mL. Echogenicity within normal limits. No mass or hydronephrosis visualized. Left Kidney: Renal measurements: 10.0 x 5.0 x 4.5 cm = volume: 116 mL. Echogenicity within normal limits. No mass or hydronephrosis visualized. Bladder: Appears normal for degree of bladder distention. Bilateral  ureteral jets noted. Other: None. IMPRESSION: Unremarkable renal ultrasound. Electronically Signed   By: Anner Crete M.D.   On: 01/17/2020 17:57    Microbiology: Recent Results (from the past 240 hour(s))  Respiratory Panel by RT PCR (Flu A&B, Covid) - Nasopharyngeal Swab     Status: None   Collection Time: 01/17/20 12:20 PM   Specimen: Nasopharyngeal Swab  Result Value Ref Range Status   SARS Coronavirus 2 by RT  PCR NEGATIVE NEGATIVE Final   Influenza A by PCR NEGATIVE NEGATIVE Final   Influenza B by PCR NEGATIVE NEGATIVE Final    Comment: Performed at [facility], [address] [place], [place] [address]  Respiratory Panel by PCR     Status: Abnormal   Collection Time: 01/17/20  5:00 PM   Specimen: Nasopharyngeal Swab; Respiratory  Result Value Ref Range Status   Adenovirus NOT DETECTED NOT DETECTED Final   Coronavirus 229E NOT DETECTED NOT DETECTED Final    Comment: (NOTE) The Coronavirus on the Respiratory Panel, DOES NOT test for the novel  Coronavirus (2019 nCoV)    Coronavirus HKU1 NOT DETECTED NOT DETECTED Final   Coronavirus NL63 NOT DETECTED NOT DETECTED Final   Coronavirus OC43 NOT DETECTED NOT DETECTED Final   Metapneumovirus DETECTED (A) NOT DETECTED Final   Rhinovirus / Enterovirus NOT DETECTED NOT DETECTED Final   Influenza A NOT DETECTED NOT DETECTED Final   Influenza B NOT DETECTED NOT DETECTED Final   Parainfluenza Virus 1 NOT DETECTED NOT DETECTED Final   Parainfluenza Virus 2 NOT DETECTED NOT DETECTED Final   Parainfluenza Virus 3 NOT DETECTED NOT DETECTED Final   Parainfluenza Virus 4 NOT DETECTED NOT DETECTED Final   Respiratory Syncytial Virus NOT DETECTED NOT DETECTED Final   Bordetella pertussis NOT DETECTED NOT DETECTED Final   Chlamydophila pneumoniae NOT DETECTED NOT DETECTED Final   Mycoplasma pneumoniae NOT DETECTED NOT DETECTED Final    Comment: Performed at [facility], [place]. [address] [place], [place] [address]     Labs: Basic Metabolic Panel: Recent Labs  Lab 01/17/20 1420 01/18/20 0510  NA 138 136  K 3.6 4.1  CL 107 108  CO2 21* 20*  GLUCOSE 70 170*  BUN 30* 28*  CREATININE 2.27* 2.20*  CALCIUM 8.1* 7.3*   Liver Function Tests: Recent Labs  Lab 01/17/20 1420  AST 23  ALT 17  ALKPHOS 74  BILITOT 0.8  PROT 6.3*  ALBUMIN 2.5*   Recent Labs  Lab 01/17/20 1420  LIPASE 30   No results for input(s): AMMONIA in the last 168  hours. CBC: Recent Labs  Lab 01/17/20 1420 01/18/20 0510  WBC 8.8 12.5*  NEUTROABS 4.3  --   HGB 10.5* 9.1*  HCT 32.5* 28.9*  MCV 92.1 96.3  PLT 152 121*   CBG: Recent Labs  Lab 01/17/20 1734 01/18/20 0926 01/18/20 1159  GLUCAP 56* 193* 132*    Signed:  [name] MD.  [facility] 01/18/2020, 1:36 PM

## 2020-01-19 ENCOUNTER — Telehealth: Payer: Self-pay

## 2020-01-19 DIAGNOSIS — B182 Chronic viral hepatitis C: Secondary | ICD-10-CM

## 2020-01-19 DIAGNOSIS — E1140 Type 2 diabetes mellitus with diabetic neuropathy, unspecified: Secondary | ICD-10-CM

## 2020-01-19 NOTE — Telephone Encounter (Signed)
Transition Care Management Follow-up Telephone Call  Date of discharge and from where: 01/18/2020 from [facility]  How have you been since you were released from the hospital? Patient sounded a bit winded but stated that she was fine and just needed to take her medicaiton.   Any questions or concerns? No  Items Reviewed:  Did the pt receive and understand the discharge instructions provided? Yes   Medications obtained and verified? Yes   Other? No   Any new allergies since your discharge? No   Dietary orders reviewed? Yes, but patient was a bit confused and more worried about people "bothering her and nagging" her  Do you have support at home? No   Functional Questionnaire: (I = Independent and D = Dependent) ADLs: I  Bathing/Dressing- I  Meal Prep- I  Eating- I  Maintaining continence- I  Transferring/Ambulation- I  Managing Meds- I  Follow up appointments reviewed:   PCP Hospital f/u appt confirmed? Yes  Scheduled to see Tula Nakayama, MD on 03/05/2020 @ 01:20 pm.  [facility] f/u appt confirmed? Yes  Scheduled to see Dr. Anthony Sar on 02/19/2020 @ 09:45am. (Infectious Disease).  Are transportation arrangements needed? No   If their condition worsens, is the pt aware to call PCP or go to the Emergency Dept.? YES  Was the patient provided with contact information for the PCP's office or ED? YES  Was to pt encouraged to call back with questions or concerns? YES

## 2020-01-22 ENCOUNTER — Telehealth: Payer: Self-pay

## 2020-01-22 NOTE — Telephone Encounter (Signed)
Transition Care Management Unsuccessful Follow-up Telephone Call  Date of discharge and from where:  [facility] 01/18/20 Attempts:  1st Attempt  Reason for unsuccessful TCM follow-up call:  No answer/busy

## 2020-01-23 ENCOUNTER — Other Ambulatory Visit: Payer: Self-pay

## 2020-01-23 NOTE — Patient Outreach (Signed)
Care Coordination  01/23/2020  [name] 13-May-1960 [phone]  BSW completed telephone outreach to patient. Patient declined services from the Managed Medicaid Team. BSW provided information to patient for the future.  [name], BSW, [place]  High Risk Managed Medicaid Team

## 2020-01-23 NOTE — Patient Instructions (Signed)
  Thank you for speaking with me today regarding care management and care coordination needs.  If you change your mind and decide that you would like services from the Managed Medicaid team, I can be reached at [phone].  Thank you  [name], BSW, [place]  High Risk Managed Medicaid Team

## 2020-02-01 DIAGNOSIS — E1129 Type 2 diabetes mellitus with other diabetic kidney complication: Secondary | ICD-10-CM | POA: Diagnosis not present

## 2020-02-01 DIAGNOSIS — N170 Acute kidney failure with tubular necrosis: Secondary | ICD-10-CM | POA: Diagnosis not present

## 2020-02-01 DIAGNOSIS — Z79899 Other long term (current) drug therapy: Secondary | ICD-10-CM | POA: Diagnosis not present

## 2020-02-01 DIAGNOSIS — I129 Hypertensive chronic kidney disease with stage 1 through stage 4 chronic kidney disease, or unspecified chronic kidney disease: Secondary | ICD-10-CM | POA: Diagnosis not present

## 2020-02-01 DIAGNOSIS — E872 Acidosis: Secondary | ICD-10-CM | POA: Diagnosis not present

## 2020-02-01 DIAGNOSIS — Z716 Tobacco abuse counseling: Secondary | ICD-10-CM | POA: Diagnosis not present

## 2020-02-01 DIAGNOSIS — E1122 Type 2 diabetes mellitus with diabetic chronic kidney disease: Secondary | ICD-10-CM | POA: Diagnosis not present

## 2020-02-01 DIAGNOSIS — N189 Chronic kidney disease, unspecified: Secondary | ICD-10-CM | POA: Diagnosis not present

## 2020-02-01 DIAGNOSIS — E559 Vitamin D deficiency, unspecified: Secondary | ICD-10-CM | POA: Diagnosis not present

## 2020-02-01 DIAGNOSIS — D638 Anemia in other chronic diseases classified elsewhere: Secondary | ICD-10-CM | POA: Diagnosis not present

## 2020-02-01 DIAGNOSIS — R809 Proteinuria, unspecified: Secondary | ICD-10-CM | POA: Diagnosis not present

## 2020-02-02 ENCOUNTER — Ambulatory Visit: Payer: Self-pay

## 2020-02-12 DIAGNOSIS — E559 Vitamin D deficiency, unspecified: Secondary | ICD-10-CM | POA: Diagnosis not present

## 2020-02-12 DIAGNOSIS — D638 Anemia in other chronic diseases classified elsewhere: Secondary | ICD-10-CM | POA: Diagnosis not present

## 2020-02-12 DIAGNOSIS — I129 Hypertensive chronic kidney disease with stage 1 through stage 4 chronic kidney disease, or unspecified chronic kidney disease: Secondary | ICD-10-CM | POA: Diagnosis not present

## 2020-02-12 DIAGNOSIS — E1122 Type 2 diabetes mellitus with diabetic chronic kidney disease: Secondary | ICD-10-CM | POA: Diagnosis not present

## 2020-02-12 DIAGNOSIS — N189 Chronic kidney disease, unspecified: Secondary | ICD-10-CM | POA: Diagnosis not present

## 2020-02-12 DIAGNOSIS — N170 Acute kidney failure with tubular necrosis: Secondary | ICD-10-CM | POA: Diagnosis not present

## 2020-02-12 DIAGNOSIS — Z79899 Other long term (current) drug therapy: Secondary | ICD-10-CM | POA: Diagnosis not present

## 2020-02-19 ENCOUNTER — Other Ambulatory Visit: Payer: Self-pay

## 2020-02-19 ENCOUNTER — Ambulatory Visit (INDEPENDENT_AMBULATORY_CARE_PROVIDER_SITE_OTHER): Payer: Medicaid Other | Admitting: Gastroenterology

## 2020-02-19 ENCOUNTER — Telehealth: Payer: Self-pay

## 2020-02-19 ENCOUNTER — Telehealth (INDEPENDENT_AMBULATORY_CARE_PROVIDER_SITE_OTHER): Payer: Self-pay

## 2020-02-19 ENCOUNTER — Encounter (INDEPENDENT_AMBULATORY_CARE_PROVIDER_SITE_OTHER): Payer: Self-pay | Admitting: Gastroenterology

## 2020-02-19 ENCOUNTER — Other Ambulatory Visit (INDEPENDENT_AMBULATORY_CARE_PROVIDER_SITE_OTHER): Payer: Self-pay

## 2020-02-19 ENCOUNTER — Encounter (INDEPENDENT_AMBULATORY_CARE_PROVIDER_SITE_OTHER): Payer: Self-pay

## 2020-02-19 VITALS — BP 163/107 | HR 118 | Temp 98.6°F | Ht 67.0 in | Wt 149.0 lb

## 2020-02-19 DIAGNOSIS — Z1212 Encounter for screening for malignant neoplasm of rectum: Secondary | ICD-10-CM | POA: Diagnosis not present

## 2020-02-19 DIAGNOSIS — D649 Anemia, unspecified: Secondary | ICD-10-CM | POA: Diagnosis not present

## 2020-02-19 DIAGNOSIS — Z1211 Encounter for screening for malignant neoplasm of colon: Secondary | ICD-10-CM | POA: Diagnosis not present

## 2020-02-19 MED ORDER — SUPREP BOWEL PREP KIT 17.5-3.13-1.6 GM/177ML PO SOLN: 1.0000 | ORAL | 0 refills | Status: DC

## 2020-02-19 MED ORDER — AMLODIPINE BESYLATE 5 MG PO TABS
5.0000 mg | ORAL_TABLET | Freq: Every day | ORAL | 2 refills | Status: DC
Start: 2020-02-19 — End: 2020-04-15

## 2020-02-19 NOTE — Patient Instructions (Signed)
Schedule EGD and colonoscopy Perform blood workup

## 2020-02-19 NOTE — Telephone Encounter (Signed)
Please send in BP pills to [facility]

## 2020-02-19 NOTE — Progress Notes (Signed)
Maylon Peppers, M.D. Gastroenterology & Hepatology [facility] [address] [place], [place] [address] Primary Care Physician: [name], MD [address] [place] [address]  Referring MD: PCP  I will communicate my assessment and recommendations to the referring MD via EMR. Note: Occasional unusual wording and randomly placed punctuation marks may result from the use of speech recognition technology to transcribe this document"  Chief Complaint: Anemia  History of Present Illness: Latoya Walsh is a 59 y.o. female with PMH hep C s/p treatment with Rozann Lesches and ribavirin,DM,HTN, history of drug use (patient denies any more drug use since Hep C treatment) and CKD, who presents for evaluation of anemia.  The patient was found to have presence of anemia by her PCP and was referred to our office.  Labs back from 01/05/2020 showed she had a drop in her hemoglobin to 10.5 from a baseline between 13 and 15 in the last 10 years.  His most recent hemoglobin on 01/18/2020 was 9.1, with an MCV of 96. No iron stores are available. Patient states that she has been asymptomatic and denies having any complaints. The patient denies having any nausea, vomiting, fever, chills, hematochezia, melena, hematemesis, abdominal distention, abdominal pain, diarrhea, jaundice, pruritus or weight loss.  Last EGD: never Last Colonoscopy: never  FHx: neg for any gastrointestinal/liver disease, no malignancies Social: smokes once cig per day, stopped alcohol or drug use at least 7 years ago. Surgical: gallbladder  Past Medical History: Past Medical History:  Diagnosis Date  . Allergic rhinitis   . Anxiety   . Chronic back pain   . Chronic hepatitis C without hepatic coma ([place])   . Depression   . Diabetes mellitus   . Hepatitis C   . Hypertension   . Noncompliance   . Poor appetite 07/17/2014  . Substance abuse ([place])    HX of  drug use and alcohol use    Past Surgical History: Past Surgical History:  Procedure Laterality Date  . APPENDECTOMY  2004    Family History: Family History  Problem Relation Age of Onset  . Diabetes Sister        Twin - AIDS     Social History: Social History   Tobacco Use  Smoking Status Light Tobacco Smoker  . Packs/day: 0.25  . Types: Cigarettes  Smokeless Tobacco Never Used   Social History   Substance and Sexual Activity  Alcohol Use Not Currently  . Alcohol/week: 40.0 standard drinks  . Types: 40 Cans of beer per week   Comment: 2 16oz cans of beer a day   Social History   Substance and Sexual Activity  Drug Use Not Currently  . Types: Marijuana, Cocaine   Comment: Last smoked marijuana yesterday 10/18/14    Allergies: Allergies  Allergen Reactions  . Penicillins Itching and Rash    Medications: Current Outpatient Medications  Medication Sig Dispense Refill  . aspirin EC 81 MG tablet Take 1 tablet (81 mg total) by mouth daily. 90 tablet 3  . blood glucose meter kit and supplies Dispense based on patient and insurance preference. Once daily testing dx E11.9. 1 each 0  . ergocalciferol (VITAMIN D2) 1.25 MG (50000 UT) capsule Take 1 capsule (50,000 Units total) by mouth once a week. One capsule once weekly 12 capsule 2  . fluticasone (FLONASE) 50 MCG/ACT nasal spray Place 1 spray into both nostrils daily. 16 g 1  . glipiZIDE (GLUCOTROL XL)  5 MG 24 hr tablet Take 1 tablet (5 mg total) by mouth daily with breakfast. 90 tablet 3  . Ipratropium-Albuterol (COMBIVENT) 20-100 MCG/ACT AERS respimat Inhale 1 puff into the lungs every 6 (six) hours as needed for wheezing. 4 g 1  . loratadine (CLARITIN) 10 MG tablet Take 1 tablet (10 mg total) by mouth daily. 30 tablet 1  . rosuvastatin (CRESTOR) 5 MG tablet Take 1 tablet (5 mg total) by mouth daily. 90 tablet 3  . amLODipine (NORVASC) 5 MG tablet TAKE ONE TABLET BY MOUTH ONCE DAILY. (Patient not taking: Reported on  02/19/2020) 30 tablet 0  . FLUoxetine (PROZAC) 20 MG tablet Take 1 tablet (20 mg total) by mouth daily. (Patient not taking: Reported on 02/19/2020) 30 tablet 5  . guaiFENesin-dextromethorphan (ROBITUSSIN DM) 100-10 MG/5ML syrup Take 5 mLs by mouth every 6 (six) hours as needed for cough. (Patient not taking: Reported on 02/19/2020) 236 mL 0   No current facility-administered medications for this visit.    Review of Systems: GENERAL: negative for malaise, night sweats HEENT: No changes in hearing or vision, no nose bleeds or other nasal problems. NECK: Negative for lumps, goiter, pain and significant neck swelling RESPIRATORY: Negative for cough, wheezing CARDIOVASCULAR: Negative for chest pain, leg swelling, palpitations, orthopnea GI: SEE HPI MUSCULOSKELETAL: Negative for joint pain or swelling, back pain, and muscle pain. SKIN: Negative for lesions, rash PSYCH: Negative for sleep disturbance, mood disorder and recent psychosocial stressors. HEMATOLOGY Negative for prolonged bleeding, bruising easily, and swollen nodes. ENDOCRINE: Negative for cold or heat intolerance, polyuria, polydipsia and goiter. NEURO: negative for tremor, gait imbalance, syncope and seizures. The remainder of the review of systems is noncontributory.   Physical Exam: BP (!) 163/107 (BP Location: Left Arm, Patient Position: Sitting, Cuff Size: Small) Comment: Patient states she ran out of medication, advised to see pcp  Pulse (!) 118   Temp 98.6 F (37 C) (Oral)   Ht _0  (1.702 m)   Wt 149 lb (67.6 kg)   LMP 06/23/2010   BMI 23.34 kg/m  GENERAL: The patient is AO x3, in no acute distress. HEENT: Head is normocephalic and atraumatic. EOMI are intact. Mouth is well hydrated and without lesions. NECK: Supple. No masses LUNGS: Clear to auscultation. No presence of rhonchi/wheezing/rales. Adequate chest expansion HEART: RRR, normal s1 and s2. ABDOMEN: Soft, nontender, no guarding, no peritoneal signs, and  nondistended. BS +. No masses. EXTREMITIES: Without any cyanosis, clubbing, rash, lesions or edema. NEUROLOGIC: AOx3, no focal motor deficit. SKIN: no jaundice, no rashes  Imaging/Labs: as above  I personally reviewed and interpreted the available labs, imaging and endoscopic files.  Impression and Plan: Latoya Walsh is a 59 y.o. female with PMH hep C s/p treatment with Viekira pak and ribavirin, DM, HTN, history of drug use and CKD, who presents for evaluation of anemia. The patient has not presented any clinical evidence of gastrointestinal bleeding.  However, she has presented a significant drop in her hemoglobin this year.  I consider this needs to be further characterized by checking her iron stores as her anemia could be related to a noniron deficiency anemia etiology.  This will be ordered today.  However, given the fact that patient has never had a screening colonoscopy, will order this today, as well as an EGD to be performed on the same day for evaluation of her anemia.  If she has low iron stores, will recommend starting iron supplementation orally.  The patient understood and agreed.  -  Schedule EGD and colonoscopy - Check CBC and iron stores  All questions were answered.      Maylon Peppers, MD Gastroenterology and Hepatology [facility]

## 2020-02-19 NOTE — Telephone Encounter (Signed)
Medication refilled

## 2020-02-19 NOTE — Telephone Encounter (Signed)
LeighAnn Vannesa Abair, CMA  

## 2020-02-20 ENCOUNTER — Telehealth: Payer: Self-pay | Admitting: Family Medicine

## 2020-02-20 LAB — CBC WITH DIFFERENTIAL/PLATELET
Absolute Monocytes: 646 cells/uL (ref 200–950)
Basophils Absolute: 60 cells/uL (ref 0–200)
Basophils Relative: 0.7 %
Eosinophils Absolute: 408 cells/uL (ref 15–500)
Eosinophils Relative: 4.8 %
HCT: 33.3 % — ABNORMAL LOW (ref 35.0–45.0)
Hemoglobin: 10.8 g/dL — ABNORMAL LOW (ref 11.7–15.5)
Lymphs Abs: 3545 cells/uL (ref 850–3900)
MCH: 29.2 pg (ref 27.0–33.0)
MCHC: 32.4 g/dL (ref 32.0–36.0)
MCV: 90 fL (ref 80.0–100.0)
MPV: 11.3 fL (ref 7.5–12.5)
Monocytes Relative: 7.6 %
Neutro Abs: 3842 cells/uL (ref 1500–7800)
Neutrophils Relative %: 45.2 %
Platelets: 201 10*3/uL (ref 140–400)
RBC: 3.7 10*6/uL — ABNORMAL LOW (ref 3.80–5.10)
RDW: 12.9 % (ref 11.0–15.0)
Total Lymphocyte: 41.7 %
WBC: 8.5 10*3/uL (ref 3.8–10.8)

## 2020-02-20 LAB — IRON, TOTAL/TOTAL IRON BINDING CAP
%SAT: 20 % (calc) (ref 16–45)
Iron: 67 ug/dL (ref 45–160)
TIBC: 339 mcg/dL (calc) (ref 250–450)

## 2020-02-20 LAB — FERRITIN: Ferritin: 120 ng/mL (ref 16–232)

## 2020-02-20 NOTE — Telephone Encounter (Signed)
pls advise needs to see nephrologist , n has missed 2 appts, ensure she understands and agrees to go , needs new referral entered, I will sign, note from Nephrology at your station, thanks

## 2020-02-21 NOTE — Telephone Encounter (Signed)
Patient states she had no ride to [place] ad she has an appt at [facility]  office on Dec 9th

## 2020-02-29 DIAGNOSIS — N189 Chronic kidney disease, unspecified: Secondary | ICD-10-CM | POA: Diagnosis not present

## 2020-02-29 DIAGNOSIS — E1122 Type 2 diabetes mellitus with diabetic chronic kidney disease: Secondary | ICD-10-CM | POA: Diagnosis not present

## 2020-02-29 DIAGNOSIS — E211 Secondary hyperparathyroidism, not elsewhere classified: Secondary | ICD-10-CM | POA: Diagnosis not present

## 2020-02-29 DIAGNOSIS — R808 Other proteinuria: Secondary | ICD-10-CM | POA: Diagnosis not present

## 2020-02-29 DIAGNOSIS — Z8619 Personal history of other infectious and parasitic diseases: Secondary | ICD-10-CM | POA: Diagnosis not present

## 2020-02-29 DIAGNOSIS — E872 Acidosis: Secondary | ICD-10-CM | POA: Diagnosis not present

## 2020-02-29 DIAGNOSIS — D638 Anemia in other chronic diseases classified elsewhere: Secondary | ICD-10-CM | POA: Diagnosis not present

## 2020-02-29 DIAGNOSIS — I129 Hypertensive chronic kidney disease with stage 1 through stage 4 chronic kidney disease, or unspecified chronic kidney disease: Secondary | ICD-10-CM | POA: Diagnosis not present

## 2020-03-05 ENCOUNTER — Ambulatory Visit (INDEPENDENT_AMBULATORY_CARE_PROVIDER_SITE_OTHER): Payer: Medicaid Other | Admitting: Family Medicine

## 2020-03-05 ENCOUNTER — Other Ambulatory Visit: Payer: Self-pay

## 2020-03-05 ENCOUNTER — Encounter: Payer: Self-pay | Admitting: Family Medicine

## 2020-03-05 VITALS — BP 135/78 | HR 100 | Resp 16 | Ht 67.0 in | Wt 149.1 lb

## 2020-03-05 DIAGNOSIS — L608 Other nail disorders: Secondary | ICD-10-CM | POA: Diagnosis not present

## 2020-03-05 DIAGNOSIS — M79642 Pain in left hand: Secondary | ICD-10-CM | POA: Diagnosis not present

## 2020-03-05 DIAGNOSIS — N184 Chronic kidney disease, stage 4 (severe): Secondary | ICD-10-CM

## 2020-03-05 DIAGNOSIS — F1010 Alcohol abuse, uncomplicated: Secondary | ICD-10-CM

## 2020-03-05 DIAGNOSIS — Z23 Encounter for immunization: Secondary | ICD-10-CM

## 2020-03-05 DIAGNOSIS — F5104 Psychophysiologic insomnia: Secondary | ICD-10-CM

## 2020-03-05 DIAGNOSIS — F17218 Nicotine dependence, cigarettes, with other nicotine-induced disorders: Secondary | ICD-10-CM

## 2020-03-05 DIAGNOSIS — E1122 Type 2 diabetes mellitus with diabetic chronic kidney disease: Secondary | ICD-10-CM

## 2020-03-05 DIAGNOSIS — E1140 Type 2 diabetes mellitus with diabetic neuropathy, unspecified: Secondary | ICD-10-CM

## 2020-03-05 MED ORDER — MIRTAZAPINE 15 MG PO TABS: 15.0000 mg | ORAL_TABLET | Freq: Every day | ORAL | 4 refills | Status: DC

## 2020-03-05 NOTE — Progress Notes (Signed)
Latoya Walsh     MRN: [id number]      DOB: 1960/04/18   HPI Ms. Rumberger is here for follow up and re-evaluation of chronic medical conditions, medication management and review of any available recent lab and radiology data.  Preventive health is updated, specifically  Cancer screening and Immunization.   Questions or concerns regarding consultations or procedures which the PT has had in the interim are  addressed. The PT denies any adverse reactions to current medications since the last visit.  Tc/o poor appetite and wants medication for her nerves C/o left hand pain which disturbs hersleep  C/o excessively.ong nails wants them cut  ROS Denies recent fever or chills. Denies sinus pressure, nasal congestion, ear pain or sore throat. Denies chest congestion, productive cough or wheezing. Denies chest pains, palpitations and leg swelling Denies abdominal pain, nausea, vomiting,diarrhea or constipation.   Denies dysuria, frequency, hesitancy or incontinence.  Denies headaches, seizures, numbness, or tingling.  Denies skin break down or rash.   PE  BP 135/78   Pulse 100   Resp 16   Ht 5\' 7"  (1.702 m)   Wt 149 lb 1.9 oz (67.6 kg)   LMP 06/23/2010   SpO2 99%   BMI 23.36 kg/m   Patient alert and oriented and in no cardiopulmonary distress.  HEENT: No facial asymmetry, EOMI,     Neck supple .  Chest: Clear to auscultation bilaterally.  CVS: S1, S2 no murmurs, no S3.Regular rate.  ABD: Soft non tender.   Ext: No edema  MS: Adequate ROM spine, shoulders, hips and knees.  Skin: Intact, no ulcerations or rash noted.  Psych: Good eye contact, not anxious or depressed appearing.  CNS: CN 2-12 intact, power,  normal throughout.no focal deficits noted.   Assessment & Plan  Nicotine dependence Asked:confirms currently smokes cigarettes Assess: Unwilling to set a quit date, but is cutting back Advise: needs to QUIT to reduce risk of cancer, cardio and cerebrovascular  disease Assist: counseled for 5 minutes and literature provided Arrange: follow up in 2 to 4 months   Alcohol abuse specificall asked and denies current use  Controlled type 2 diabetes with neuropathy ([place]) Ms. Bogdan is reminded of the importance of commitment to daily physical activity for 30 minutes or more, as able and the need to limit carbohydrate intake to 30 to 60 grams per meal to help with blood sugar control.   The need to take medication as prescribed, test blood sugar as directed, and to call between visits if there is a concern that blood sugar is uncontrolled is also discussed.   Ms. Cianci is reminded of the importance of daily foot exam, annual eye examination, and good blood sugar, blood pressure and cholesterol control.  Diabetic Labs Latest Ref Rng & Units 01/18/2020 01/17/2020 01/05/2020 09/04/2019 03/28/2019  HbA1c 4.8 - 5.6 % - 6.8(H) 6.7(H) 6.4(A) 7.4(H)  Microalbumin mg/dL - - - - >600.0  Micro/Creat Ratio 0.0 - 30.0 mg/g - - - - -  Chol 100 - 199 mg/dL - - 215(H) - 211(H)  HDL >39 mg/dL - - 34(L) - 44(L)  Calc LDL 0 - 99 mg/dL - - 137(H) - 136(H)  Triglycerides 0 - 149 mg/dL - - 244(H) - 179(H)  Creatinine 0.44 - 1.00 mg/dL 2.20(H) 2.27(H) 1.92(H) - 1.49(H)   BP/Weight 03/05/2020 02/19/2020 01/18/2020 01/17/2020 12/20/2019 12/16/2019 8/84/1660  Systolic BP 630 160 109 - 323 557 322  Diastolic BP 78 025 66 - 71 71 111  Wt. (Lbs) 149.12 149 - 147 153 153 153  BMI 23.36 23.34 - 23.02 23.96 23.96 23.96   Foot/eye exam completion dates 03/05/2020 01/24/2019  Foot Form Completion Done Done   Controlled, no change in medication      Insomnia Sleep hygiene reviewed and written information offered also. Prescription sent for  medication needed. Remeron atrted  Left hand pain Reports disturbs sleep c/w carpal tunnel refer Ortho

## 2020-03-05 NOTE — Patient Instructions (Signed)
F/U in office wit MD in 4 months, call if you need me sooner  Flu vaccine today  You are referred to Orthopedics re left hand pain  You are referred to Podiatry to have toenails cut and foot care  Stop smoking altogether please  New is remeron at bedtime for nerves and appetite   GREAT that you got your flu vaccine today  Holiday cheer and best for you for 2022!  Thanks for choosing [facility], we consider it a privelige to serve you.

## 2020-03-10 ENCOUNTER — Encounter: Payer: Self-pay | Admitting: Family Medicine

## 2020-03-10 DIAGNOSIS — M79642 Pain in left hand: Secondary | ICD-10-CM | POA: Insufficient documentation

## 2020-03-10 NOTE — Assessment & Plan Note (Signed)
Reports disturbs sleep c/w carpal tunnel refer Ortho

## 2020-03-10 NOTE — Assessment & Plan Note (Signed)
Sleep hygiene reviewed and written information offered also. Prescription sent for  medication needed. Remeron atrted

## 2020-03-10 NOTE — Assessment & Plan Note (Signed)
Latoya Walsh is reminded of the importance of commitment to daily physical activity for 30 minutes or more, as able and the need to limit carbohydrate intake to 30 to 60 grams per meal to help with blood sugar control.   The need to take medication as prescribed, test blood sugar as directed, and to call between visits if there is a concern that blood sugar is uncontrolled is also discussed.   Latoya Walsh is reminded of the importance of daily foot exam, annual eye examination, and good blood sugar, blood pressure and cholesterol control.  Diabetic Labs Latest Ref Rng & Units 01/18/2020 01/17/2020 01/05/2020 09/04/2019 03/28/2019  HbA1c 4.8 - 5.6 % - 6.8(H) 6.7(H) 6.4(A) 7.4(H)  Microalbumin mg/dL - - - - >600.0  Micro/Creat Ratio 0.0 - 30.0 mg/g - - - - -  Chol 100 - 199 mg/dL - - 215(H) - 211(H)  HDL >39 mg/dL - - 34(L) - 44(L)  Calc LDL 0 - 99 mg/dL - - 137(H) - 136(H)  Triglycerides 0 - 149 mg/dL - - 244(H) - 179(H)  Creatinine 0.44 - 1.00 mg/dL 2.20(H) 2.27(H) 1.92(H) - 1.49(H)   BP/Weight 03/05/2020 02/19/2020 01/18/2020 01/17/2020 12/20/2019 12/16/2019 0/17/7939  Systolic BP 030 092 330 - 076 226 333  Diastolic BP 78 545 66 - 71 71 111  Wt. (Lbs) 149.12 149 - 147 153 153 153  BMI 23.36 23.34 - 23.02 23.96 23.96 23.96   Foot/eye exam completion dates 03/05/2020 01/24/2019  Foot Form Completion Done Done   Controlled, no change in medication

## 2020-03-10 NOTE — Assessment & Plan Note (Signed)
Asked:confirms currently smokes cigarettes °Assess: Unwilling to set a quit date, but is cutting back °Advise: needs to QUIT to reduce risk of cancer, cardio and cerebrovascular disease °Assist: counseled for 5 minutes and literature provided °Arrange: follow up in 2 to 4 months ° °

## 2020-03-10 NOTE — Assessment & Plan Note (Signed)
specificall asked and denies current use

## 2020-03-12 ENCOUNTER — Telehealth: Payer: Self-pay

## 2020-03-12 ENCOUNTER — Other Ambulatory Visit (HOSPITAL_COMMUNITY): Payer: Self-pay | Admitting: Nephrology

## 2020-03-12 ENCOUNTER — Other Ambulatory Visit: Payer: Self-pay | Admitting: Nephrology

## 2020-03-12 DIAGNOSIS — E1122 Type 2 diabetes mellitus with diabetic chronic kidney disease: Secondary | ICD-10-CM

## 2020-03-12 DIAGNOSIS — R809 Proteinuria, unspecified: Secondary | ICD-10-CM

## 2020-03-12 DIAGNOSIS — I129 Hypertensive chronic kidney disease with stage 1 through stage 4 chronic kidney disease, or unspecified chronic kidney disease: Secondary | ICD-10-CM

## 2020-03-12 DIAGNOSIS — R768 Other specified abnormal immunological findings in serum: Secondary | ICD-10-CM

## 2020-03-12 DIAGNOSIS — E872 Acidosis, unspecified: Secondary | ICD-10-CM

## 2020-03-12 DIAGNOSIS — N2581 Secondary hyperparathyroidism of renal origin: Secondary | ICD-10-CM

## 2020-03-12 DIAGNOSIS — D638 Anemia in other chronic diseases classified elsewhere: Secondary | ICD-10-CM

## 2020-03-19 ENCOUNTER — Other Ambulatory Visit (INDEPENDENT_AMBULATORY_CARE_PROVIDER_SITE_OTHER): Payer: Self-pay

## 2020-03-19 DIAGNOSIS — E211 Secondary hyperparathyroidism, not elsewhere classified: Secondary | ICD-10-CM | POA: Diagnosis not present

## 2020-03-19 DIAGNOSIS — E872 Acidosis: Secondary | ICD-10-CM | POA: Diagnosis not present

## 2020-03-19 DIAGNOSIS — R808 Other proteinuria: Secondary | ICD-10-CM | POA: Diagnosis not present

## 2020-03-19 DIAGNOSIS — E1122 Type 2 diabetes mellitus with diabetic chronic kidney disease: Secondary | ICD-10-CM | POA: Diagnosis not present

## 2020-03-19 DIAGNOSIS — N189 Chronic kidney disease, unspecified: Secondary | ICD-10-CM | POA: Diagnosis not present

## 2020-03-19 DIAGNOSIS — Z8619 Personal history of other infectious and parasitic diseases: Secondary | ICD-10-CM | POA: Diagnosis not present

## 2020-03-19 DIAGNOSIS — I129 Hypertensive chronic kidney disease with stage 1 through stage 4 chronic kidney disease, or unspecified chronic kidney disease: Secondary | ICD-10-CM | POA: Diagnosis not present

## 2020-03-19 DIAGNOSIS — D638 Anemia in other chronic diseases classified elsewhere: Secondary | ICD-10-CM | POA: Diagnosis not present

## 2020-03-20 ENCOUNTER — Telehealth (INDEPENDENT_AMBULATORY_CARE_PROVIDER_SITE_OTHER): Payer: Self-pay

## 2020-03-20 ENCOUNTER — Other Ambulatory Visit (INDEPENDENT_AMBULATORY_CARE_PROVIDER_SITE_OTHER): Payer: Self-pay

## 2020-03-20 NOTE — Telephone Encounter (Signed)
Opened In Error

## 2020-03-25 ENCOUNTER — Other Ambulatory Visit (HOSPITAL_COMMUNITY)
Admission: RE | Admit: 2020-03-25 | Discharge: 2020-03-25 | Disposition: A | Discharge status: Home / Self Care | Payer: Medicaid Other | Source: Ambulatory Visit | Attending: Gastroenterology | Admitting: Gastroenterology

## 2020-03-25 NOTE — Progress Notes (Signed)
Called patient, she said she didn't think she could make it for her COVID test today. Patient's procedure is tomorrow. I gave patient Dr. Colman Cater # to call and reschedule her procedure.  I'll cancel her covid screening and let registration know.

## 2020-03-26 ENCOUNTER — Ambulatory Visit (HOSPITAL_COMMUNITY): Admission: RE | Admit: 2020-03-26 | Payer: Medicaid Other | Source: Home / Self Care | Admitting: Gastroenterology

## 2020-03-26 ENCOUNTER — Encounter (HOSPITAL_COMMUNITY): Admission: RE | Payer: Self-pay | Source: Home / Self Care

## 2020-03-26 SURGERY — COLONOSCOPY WITH PROPOFOL
Anesthesia: Monitor Anesthesia Care

## 2020-03-27 ENCOUNTER — Encounter: Payer: Self-pay | Admitting: Orthopedic Surgery

## 2020-03-27 ENCOUNTER — Other Ambulatory Visit: Payer: Self-pay

## 2020-03-27 ENCOUNTER — Ambulatory Visit (INDEPENDENT_AMBULATORY_CARE_PROVIDER_SITE_OTHER): Payer: Medicaid Other | Admitting: Orthopedic Surgery

## 2020-03-27 VITALS — BP 188/112 | HR 103 | Ht 67.0 in | Wt 151.0 lb

## 2020-03-27 DIAGNOSIS — G5622 Lesion of ulnar nerve, left upper limb: Secondary | ICD-10-CM

## 2020-03-27 MED ORDER — GABAPENTIN 100 MG PO CAPS: 100.0000 mg | ORAL_CAPSULE | Freq: Three times a day (TID) | ORAL | 2 refills | Status: DC

## 2020-03-27 NOTE — Progress Notes (Signed)
New Patient Visit  Assessment: Latoya Walsh is a 60 y.o. female with the following: Left cubital tunnel syndrome; good strength   Plan: [name] has pain, numbness and tingling in her left hand, consistent with cubital tunnel syndrome.  This has been ongoing for 3-4 months.  I will provide her with a prescription for Gabapentin and refer her to OT for a night time elbow splint, flexion to 15 degrees.  She should wear this at night.  She has good strength, and this should improve her symptoms over time.  Follow up as needed.   Follow-up: Return if symptoms worsen or fail to improve.  Subjective:  Chief Complaint  Patient presents with  . Hand Pain    Left hand, patient was told she had arthritis in her hand,     History of Present Illness: Latoya Walsh is a 60 y.o. female who has been referred to clinic today by Tula Nakayama, MD for evaluation of left hand pain.  She reports pain, numbness and tingling in her left hand for the past 3-4 months.  No previous injury to her elbow or wrist.  Her symptoms are primarily affecting her small and ring fingers.  She is not taking any medications for this problem.  She has not used a brace.  She is not having any symptoms in her right hand.    Review of Systems: No fevers or chills + numbness and tingling No chest pain No shortness of breath No bowel or bladder dysfunction No GI distress No headaches   Medical History:  Past Medical History:  Diagnosis Date  . Allergic rhinitis   . Anxiety   . Chronic back pain   . Chronic hepatitis C without hepatic coma ([place])   . Depression   . Diabetes mellitus   . Hepatitis C   . Hypertension   . Noncompliance   . Poor appetite 07/17/2014  . Substance abuse ([place])    HX of drug use and alcohol use    Past Surgical History:  Procedure Laterality Date  . APPENDECTOMY  2004    Family History  Problem Relation Age of Onset  . Diabetes Sister        Twin - AIDS    Social  History   Tobacco Use  . Smoking status: Light Tobacco Smoker    Packs/day: 0.25    Types: Cigarettes  . Smokeless tobacco: Never Used  Substance Use Topics  . Alcohol use: Not Currently    Alcohol/week: 40.0 standard drinks    Types: 40 Cans of beer per week    Comment: 2 16oz cans of beer a day  . Drug use: Not Currently    Types: Marijuana, Cocaine    Comment: Last smoked marijuana yesterday 10/18/14    Allergies  Allergen Reactions  . Penicillins Itching and Rash    Current Meds  Medication Sig  . amLODipine (NORVASC) 5 MG tablet Take 1 tablet (5 mg total) by mouth daily.  [name] aspirin EC 81 MG tablet Take 1 tablet (81 mg total) by mouth daily.  . blood glucose meter kit and supplies Dispense based on patient and insurance preference. Once daily testing dx E11.9.  [name] calcitRIOL (ROCALTROL) 0.25 MCG capsule Take 0.25 mcg by mouth every Monday, Wednesday, and Friday.  . ergocalciferol (VITAMIN D2) 1.25 MG (50000 UT) capsule Take 1 capsule (50,000 Units total) by mouth once a week. One capsule once weekly (Patient taking differently: Take 50,000 Units by mouth every Monday.  One capsule once weekly)  . fluticasone (FLONASE) 50 MCG/ACT nasal spray Place 1 spray into both nostrils daily.  [name] glipiZIDE (GLUCOTROL XL) 5 MG 24 hr tablet Take 1 tablet (5 mg total) by mouth daily with breakfast.  . guaiFENesin-dextromethorphan (ROBITUSSIN DM) 100-10 MG/5ML syrup Take 5 mLs by mouth every 6 (six) hours as needed for cough.  . Ipratropium-Albuterol (COMBIVENT) 20-100 MCG/ACT AERS respimat Inhale 1 puff into the lungs every 6 (six) hours as needed for wheezing.  [name] loratadine (CLARITIN) 10 MG tablet Take 1 tablet (10 mg total) by mouth daily.  [name] losartan (COZAAR) 25 MG tablet Take 25 mg by mouth daily.  . mirtazapine (REMERON) 15 MG tablet Take 1 tablet (15 mg total) by mouth at bedtime.  . Na Sulfate-K Sulfate-Mg Sulf (SUPREP BOWEL PREP KIT) 17.5-3.13-1.6 GM/177ML SOLN Take 1 kit by mouth as  directed.  . rosuvastatin (CRESTOR) 5 MG tablet Take 1 tablet (5 mg total) by mouth daily.  . sodium bicarbonate 650 MG tablet Take 650 mg by mouth 2 (two) times daily.    Objective: BP (!) 188/112   Pulse (!) 103   Ht _0  (1.702 m)   Wt 151 lb (68.5 kg)   LMP 06/23/2010   BMI 23.65 kg/m   Physical Exam:  General:  Alert and oriented.  No acute distress Gait: Normal  Left hand and elbow without deformity.  Decreased sensation to the ulnar hand including small and ring fingers.  Positive Tinel's at the elbow.  Positive Phalen's at the elbow.  Negative Tinel's and Phalen's at the wrist.  Grip strength 4/+5, limited by pain.  Fingers are warm and well perfused.     IMAGING: No new imaging obtained today   New Medications:  No orders of the defined types were placed in this encounter.     Mordecai Rasmussen, MD  03/27/2020 9:19 AM

## 2020-04-01 ENCOUNTER — Encounter (HOSPITAL_COMMUNITY): Payer: Self-pay

## 2020-04-01 ENCOUNTER — Other Ambulatory Visit: Payer: Self-pay

## 2020-04-01 ENCOUNTER — Ambulatory Visit (HOSPITAL_COMMUNITY): Payer: Medicaid Other | Attending: Orthopedic Surgery

## 2020-04-01 DIAGNOSIS — M79642 Pain in left hand: Secondary | ICD-10-CM | POA: Diagnosis present

## 2020-04-01 NOTE — Therapy (Signed)
[facility] [address] [place], [place], [address] Phone: [phone]   Fax:  [phone]  Occupational Therapy Evaluation  Patient Details  Name: Latoya Walsh MRN: [id number] Date of Birth: [birth year] Referring Provider (OT): Larena Glassman, MD   Encounter Date: 04/01/2020   OT End of Session - 04/01/20 1353    Visit Number 1    Number of Visits 1    Authorization Type Medicaid UHC community    Authorization Time Period no authorization needed for patient's over 21 years and older    OT Start Time 1115    OT Stop Time 1158    OT Time Calculation (min) 43 min    Activity Tolerance Patient tolerated treatment well    Behavior During Therapy [facility] for tasks assessed/performed           Past Medical History:  Diagnosis Date  . Allergic rhinitis   . Anxiety   . Chronic back pain   . Chronic hepatitis C without hepatic coma ([place])   . Depression   . Diabetes mellitus   . Hepatitis C   . Hypertension   . Noncompliance   . Poor appetite 07/17/2014  . Substance abuse ([place])    HX of drug use and alcohol use    Past Surgical History:  Procedure Laterality Date  . APPENDECTOMY  2004    There were no vitals filed for this visit.   Subjective Assessment - 04/01/20 1242    Subjective  S: it hurts in my hand.    Pertinent History Patient is a 60 y/o female S/P left cubital tunnel syndrome presenting to OT with a referral to fabricate a night time splint for the left elbow. Patient reports that she feels pain in her left hand verbalizing that it is at the base of her fingers on her palm. Dr. Amedeo Kinsman has referred patient to occupational therapy for splint fabrication.    Patient Stated Goals To be pain free.    Currently in Pain? Yes   Unable to provide a number   Pain Location Hand   palm proximal to MCP joints   Pain Orientation Left             [facility] OT Assessment - 04/01/20 1251      Assessment   Medical Diagnosis Cubital  tunnel syndrome splint    Referring Provider (OT) Larena Glassman, MD    Onset Date/Surgical Date --   unknown   Hand Dominance Right    Next MD Visit None    Prior Therapy None      Precautions   Precautions None      Restrictions   Weight Bearing Restrictions No      Balance Screen   Has the patient fallen in the past 6 months No      Home  Environment   Family/patient expects to be discharged to: Private residence      Prior Function   Level of Independence Independent    Vocation Unemployed      ADL   ADL comments Pt reports pain in her hand that is worse at night.      Vision - History   Baseline Vision No visual deficits      Cognition   Overall Cognitive Status Within Functional Limits for tasks assessed      ROM / Strength   AROM / PROM / Strength AROM;Strength      AROM   Overall AROM  Within  functional limits for tasks performed    Overall AROM Comments LUE shoulder, elbow, wrist, and hand ranges.      Strength   Overall Strength Within functional limits for tasks performed    Overall Strength Comments LUE shoulder, elbow, wrist, and hand                    OT Treatments/Exercises (OP) - 04/01/20 1253      Splinting   Splinting Elbow extension splint fabricated for the left UE. Elbow placed in 15 degrees flexion. 3 hook velcro place with 1 soft 2 inch strap and two soft 1 inch straps. Bony prominence of left elbow addressed with moose cloth placed inside splint. Sockeette provided with multiple extras provided.                 OT Education - 04/01/20 1329    Education Details reviewed cubital tunnel syndrome and what's occuring in the elbow to cause hand pain and and numbness, reviewed use of splint, wearing schedule, care management, duration and frequency, and donning/doffing technique.    Person(s) Educated Patient    Methods Explanation;Demonstration;Handout;Verbal cues    Comprehension Verbalized understanding            OT  Short Term Goals - 04/01/20 1411      OT SHORT TERM GOAL #1   Title Pt will be educated and verbalize understanding of use of fabricated splint, care instructions, donning/doffing technique, and wearing schedule/frequency/duration.    Time 1    Period Days    Status Achieved    Target Date 04/01/20                    Plan - 04/01/20 1357    Clinical Impression Statement A: Patient is a 60 y/o female S/P left elbow pain due to cubital tunnel syndrome with reports of increased pain in the hand which is more severe at night when attempting to sleep. Elbow extension splint was fabricated at 15 degrees elbow flexion. Education provided on use of splint and wearing schedule. Educated patient on refraining of the amount of time she spends in elbow flexion during the day and to limit repetitive elbow flexion and extension motion. Pt verbalized understanding.    OT Occupational Profile and History Problem Focused Assessment - Including review of records relating to presenting problem    Occupational performance deficits (Please refer to evaluation for details): Rest and Sleep;ADL's    Body Structure / Function / Physical Skills ADL;Pain;UE functional use    Rehab Potential Excellent    Clinical Decision Making Limited treatment options, no task modification necessary    Comorbidities Affecting Occupational Performance: None    Modification or Assistance to Complete Evaluation  Min-Moderate modification of tasks or assist with assess necessary to complete eval    OT Frequency One time visit    OT Treatment/Interventions Splinting;Patient/family education    Plan P: 1 time visit for splint fabrication. Patient will call clinic if any adjustments are needed. Follow up with Dr. Amedeo Kinsman as needed.    Consulted and Agree with Plan of Care Patient           Patient will benefit from skilled therapeutic intervention in order to improve the following deficits and impairments:   Body Structure /  Function / Physical Skills: ADL,Pain,UE functional use       Visit Diagnosis: Pain in left hand - Plan: Ot plan of care cert/re-cert    Problem List Patient  Active Problem List   Diagnosis Date Noted  . Left hand pain 03/10/2020  . Anemia 02/19/2020  . Cough   . Acute renal failure superimposed on stage 3b chronic kidney disease ([place]) 01/17/2020  . COVID-19 virus infection 12/26/2019  . Counseled about COVID-19 virus infection 10/06/2019  . Depression, major, single episode, in partial remission ([place]) 01/25/2019  . Headache 11/13/2018  . Insomnia 09/25/2018  . Screening for colorectal cancer 07/23/2016  . Lumbar back pain with radiculopathy affecting right lower extremity 03/27/2015  . Non compliance with medical treatment 07/22/2014  . Chronic hepatitis C without hepatic coma ([place]) 12/04/2013  . Vaginitis and vulvovaginitis 11/23/2013  . Allergic rhinitis 11/03/2012  . HTN, goal below 130/80 06/25/2011  . Anxiety and depression 02/05/2011  . Nicotine dependence 11/01/2010  . Drug dependence, continuous abuse ([place]) 03/22/2010  . Controlled type 2 diabetes with neuropathy ([place]) 06/02/2007  . Alcohol abuse 06/02/2007   Ailene Ravel, OTR/L,CBIS  [phone]  04/01/2020, 2:14 PM  [facility] [address] [place], [place], [address] Phone: [phone]   Fax:  [phone]  Name: Latoya Walsh MRN: [id number] Date of Birth: 1960/06/08

## 2020-04-01 NOTE — Patient Instructions (Signed)
Your Splint This splint should initially be fitted by a healthcare practitioner.  The healthcare practitioner is responsible for providing wearing instructions and precautions to the patient, other healthcare practitioners and care provider involved in the patient's care.  This splint was custom made for you. Please read the following instructions to learn about wearing and caring for your splint.  Precautions Should your splint cause any of the following problems, remove the splint immediately and contact your therapist/physician.  Swelling  Severe Pain  Pressure Areas  Stiffness  Numbness  Do not wear your splint while operating machinery unless it has been fabricated for that purpose.  When To Wear Your Splint Where your splint according to your therapist/physician instructions. Night time and/or any time sleeping only  Care and Cleaning of Your Splint 1. Keep your splint away from open flames. 2. Your splint will lose its shape in temperatures over 135 degrees Farenheit, ( in car windows, near radiators, ovens or in hot water).  Never make any adjustments to your splint, if the splint needs adjusting remove it and make an appointment to see your therapist. 3. Your splint, including the cushion liner may be cleaned with soap and lukewarm water.  Do not immerse in hot water over 135 degrees Farenheit. 4. Straps may be washed with soap and water, but do not moisten the self-adhesive portion. 5. For ink or hard to remove spots use a scouring cleanser which contains chlorine.  Rinse the splint thoroughly after using chlorine cleanser.

## 2020-04-03 ENCOUNTER — Ambulatory Visit (HOSPITAL_COMMUNITY): Payer: Medicaid Other

## 2020-04-15 ENCOUNTER — Other Ambulatory Visit: Payer: Self-pay | Admitting: Family Medicine

## 2020-04-24 ENCOUNTER — Telehealth (INDEPENDENT_AMBULATORY_CARE_PROVIDER_SITE_OTHER): Payer: Medicaid Other | Admitting: Family Medicine

## 2020-04-24 ENCOUNTER — Encounter: Payer: Self-pay | Admitting: Family Medicine

## 2020-04-24 ENCOUNTER — Other Ambulatory Visit: Payer: Self-pay

## 2020-04-24 VITALS — BP 154/88 | Ht 67.0 in | Wt 162.0 lb

## 2020-04-24 DIAGNOSIS — E1122 Type 2 diabetes mellitus with diabetic chronic kidney disease: Secondary | ICD-10-CM | POA: Diagnosis not present

## 2020-04-24 DIAGNOSIS — F17218 Nicotine dependence, cigarettes, with other nicotine-induced disorders: Secondary | ICD-10-CM

## 2020-04-24 DIAGNOSIS — R808 Other proteinuria: Secondary | ICD-10-CM | POA: Diagnosis not present

## 2020-04-24 DIAGNOSIS — I10 Essential (primary) hypertension: Secondary | ICD-10-CM | POA: Diagnosis not present

## 2020-04-24 DIAGNOSIS — E785 Hyperlipidemia, unspecified: Secondary | ICD-10-CM | POA: Diagnosis not present

## 2020-04-24 DIAGNOSIS — E1140 Type 2 diabetes mellitus with diabetic neuropathy, unspecified: Secondary | ICD-10-CM

## 2020-04-24 DIAGNOSIS — Z8619 Personal history of other infectious and parasitic diseases: Secondary | ICD-10-CM | POA: Diagnosis not present

## 2020-04-24 DIAGNOSIS — N189 Chronic kidney disease, unspecified: Secondary | ICD-10-CM | POA: Diagnosis not present

## 2020-04-24 DIAGNOSIS — I129 Hypertensive chronic kidney disease with stage 1 through stage 4 chronic kidney disease, or unspecified chronic kidney disease: Secondary | ICD-10-CM | POA: Diagnosis not present

## 2020-04-24 DIAGNOSIS — M7989 Other specified soft tissue disorders: Secondary | ICD-10-CM | POA: Diagnosis not present

## 2020-04-24 DIAGNOSIS — D638 Anemia in other chronic diseases classified elsewhere: Secondary | ICD-10-CM | POA: Diagnosis not present

## 2020-04-24 DIAGNOSIS — M79642 Pain in left hand: Secondary | ICD-10-CM | POA: Diagnosis not present

## 2020-04-24 DIAGNOSIS — E211 Secondary hyperparathyroidism, not elsewhere classified: Secondary | ICD-10-CM | POA: Diagnosis not present

## 2020-04-24 DIAGNOSIS — E872 Acidosis: Secondary | ICD-10-CM | POA: Diagnosis not present

## 2020-04-24 MED ORDER — LOSARTAN POTASSIUM 50 MG PO TABS: 50.0000 mg | ORAL_TABLET | Freq: Every day | ORAL | 3 refills | Status: DC

## 2020-04-24 MED ORDER — FUROSEMIDE 20 MG PO TABS: ORAL_TABLET | ORAL | 1 refills | Status: DC

## 2020-04-24 NOTE — Patient Instructions (Addendum)
Follow-up in office to reevaluate blood pressure and leg swelling in 2 weeks, call if you need me sooner.  New higher dose of Cozaar is 50 mg one daily for your blood pressure which is high.  New for leg swelling is furosemide 1 tablet three times weekly as needed.  Please get your Covid vaccine this is overdue you need two more vaccines.  Labs today lipid panel CMP and EGFR and hemoglobin A1c.  Continue therapy and brace from orthopedics for hand pain.  The specialist will take care of your pain needs as he is currently doing.

## 2020-04-25 ENCOUNTER — Telehealth (HOSPITAL_COMMUNITY): Payer: Self-pay

## 2020-04-28 ENCOUNTER — Encounter: Payer: Self-pay | Admitting: Family Medicine

## 2020-04-28 DIAGNOSIS — M7989 Other specified soft tissue disorders: Secondary | ICD-10-CM | POA: Insufficient documentation

## 2020-04-28 DIAGNOSIS — E785 Hyperlipidemia, unspecified: Secondary | ICD-10-CM | POA: Insufficient documentation

## 2020-04-28 NOTE — Assessment & Plan Note (Addendum)
New onset, low dose lasix up to 3  Times weekly prescribed for short term use

## 2020-04-28 NOTE — Assessment & Plan Note (Signed)
Reports uncontrolled pain, Ortho is managing pt she is to follow up with treatment plan

## 2020-04-28 NOTE — Assessment & Plan Note (Signed)
Asked:confirms currently smokes cigarettes Assess: Unwilling to set a quit date,  Advise: needs to QUIT to reduce risk of cancer, cardio and cerebrovascular disease Assist: counseled for 5 minutes and literature provided Arrange: follow up in 2 to 4 months  

## 2020-04-28 NOTE — Assessment & Plan Note (Signed)
Uncontrolled, increase cozaar dose and in office review in 2 weeks DASH diet and commitment to daily physical activity for a minimum of 30 minutes discussed and encouraged, as a part of hypertension management. The importance of attaining a healthy weight is also discussed.  BP/Weight 04/24/2020 03/27/2020 03/05/2020 02/19/2020 01/18/2020 01/17/2020 [id number]  Systolic BP 123456 0000000 [id number] [id number] A999333 - A999333  Diastolic BP 88 [id number] 78 XX123456 66 - 71  Wt. (Lbs) 162 151 149.12 149 - 147 153  BMI 25.37 23.65 23.36 23.34 - 23.02 23.96

## 2020-04-28 NOTE — Assessment & Plan Note (Signed)
Hyperlipidemia:Low fat diet discussed and encouraged.   Lipid Panel  Lab Results  Component Value Date   CHOL 215 (H) 01/05/2020   HDL 34 (L) 01/05/2020   LDLCALC 137 (H) 01/05/2020   TRIG 244 (H) 01/05/2020   CHOLHDL 6.3 (H) 01/05/2020   Uncontrolled and high risk of CAD, needs rept lab

## 2020-04-28 NOTE — Progress Notes (Signed)
Virtual Visit via Telephone Note  I connected with Latoya Walsh on 04/28/20 at  2:00 PM EST by telephone and verified that I am speaking with the correct person using two identifiers.  Location: Patient: office Provider: work   I discussed the limitations, risks, security and privacy concerns of performing an evaluation and management service by telephone and the availability of in person appointments. I also discussed with the patient that there may be a patient responsible charge related to this service. The patient expressed understanding and agreed to proceed.   History of Present Illness: C/o swelling of hands and feet x 1 week. No pND or orthopnea. Denies chest pain or palpitations. C/o left hand pain which is currently being managed by Orthopedics, no trauma or fracturs   Observations/Objective: BP (!) 154/88   Ht '5\' 7"'$  (1.702 m)   Wt 162 lb (73.5 kg)   LMP 06/23/2010   BMI 25.37 kg/m  Good communication with no confusion and intact memory. Alert and oriented x 3 No signs of respiratory distress during speech    Assessment and Plan: HTN, goal below 130/80 Uncontrolled, increase cozaar dose and in office review in 2 weeks DASH diet and commitment to daily physical activity for a minimum of 30 minutes discussed and encouraged, as a part of hypertension management. The importance of attaining a healthy weight is also discussed.  BP/Weight 04/24/2020 03/27/2020 03/05/2020 02/19/2020 01/18/2020 01/17/2020 [id number]  Systolic BP 123456 0000000 A999333 XX123456 A999333 - A999333  Diastolic BP 88 XX123456 78 XX123456 66 - 71  Wt. (Lbs) 162 151 149.12 149 - 147 153  BMI 25.37 23.65 23.36 23.34 - 23.02 23.96       Left hand pain Reports uncontrolled pain, Ortho is managing pt she is to follow up with treatment plan  Controlled type 2 diabetes with neuropathy ([place]) Latoya Walsh is reminded of the importance of commitment to daily physical activity for 30 minutes or more, as able and the need to limit  carbohydrate intake to 30 to 60 grams per meal to help with blood sugar control.   The need to take medication as prescribed, test blood sugar as directed, and to call between visits if there is a concern that blood sugar is uncontrolled is also discussed.   Latoya Walsh is reminded of the importance of daily foot exam, annual eye examination, and good blood sugar, blood pressure and cholesterol control. Updated lab needed.   Diabetic Labs Latest Ref Rng & Units 01/18/2020 01/17/2020 01/05/2020 09/04/2019 03/28/2019  HbA1c 4.8 - 5.6 % - 6.8(H) 6.7(H) 6.4(A) 7.4(H)  Microalbumin mg/dL - - - - >600.0  Micro/Creat Ratio 0.0 - 30.0 mg/g - - - - -  Chol 100 - 199 mg/dL - - 215(H) - 211(H)  HDL >39 mg/dL - - 34(L) - 44(L)  Calc LDL 0 - 99 mg/dL - - 137(H) - 136(H)  Triglycerides 0 - 149 mg/dL - - 244(H) - 179(H)  Creatinine 0.44 - 1.00 mg/dL 2.20(H) 2.27(H) 1.92(H) - 1.49(H)   BP/Weight 04/24/2020 03/27/2020 03/05/2020 02/19/2020 01/18/2020 01/17/2020 [id number]  Systolic BP 123456 0000000 A999333 XX123456 A999333 - A999333  Diastolic BP 88 XX123456 78 XX123456 66 - 71  Wt. (Lbs) 162 151 149.12 149 - 147 153  BMI 25.37 23.65 23.36 23.34 - 23.02 23.96   Foot/eye exam completion dates 03/05/2020 01/24/2019  Foot Form Completion Done Done        Nicotine dependence Asked:confirms currently smokes cigarettes Assess: Unwilling to set a quit date, Advise:  needs to QUIT to reduce risk of cancer, cardio and cerebrovascular disease Assist: counseled for 5 minutes and literature provided Arrange: follow up in 2 to 4 months   Leg swelling New onset, low dose lasix up to 3  Times weekly prescribed for short term use  Hyperlipidemia LDL goal <100 Hyperlipidemia:Low fat diet discussed and encouraged.   Lipid Panel  Lab Results  Component Value Date   CHOL 215 (H) 01/05/2020   HDL 34 (L) 01/05/2020   LDLCALC 137 (H) 01/05/2020   TRIG 244 (H) 01/05/2020   CHOLHDL 6.3 (H) 01/05/2020   Uncontrolled and high risk of CAD, needs  rept lab       Follow Up Instructions:    I discussed the assessment and treatment plan with the patient. The patient was provided an opportunity to ask questions and all were answered. The patient agreed with the plan and demonstrated an understanding of the instructions.   The patient was advised to call back or seek an in-person evaluation if the symptoms worsen or if the condition fails to improve as anticipated.  I provided 15 minutes of non-face-to-face time during this encounter.   Tula Nakayama, MD

## 2020-04-28 NOTE — Assessment & Plan Note (Signed)
Latoya Walsh is reminded of the importance of commitment to daily physical activity for 30 minutes or more, as able and the need to limit carbohydrate intake to 30 to 60 grams per meal to help with blood sugar control.   The need to take medication as prescribed, test blood sugar as directed, and to call between visits if there is a concern that blood sugar is uncontrolled is also discussed.   Latoya Walsh is reminded of the importance of daily foot exam, annual eye examination, and good blood sugar, blood pressure and cholesterol control. Updated lab needed.   Diabetic Labs Latest Ref Rng & Units 01/18/2020 01/17/2020 01/05/2020 09/04/2019 03/28/2019  HbA1c 4.8 - 5.6 % - 6.8(H) 6.7(H) 6.4(A) 7.4(H)  Microalbumin mg/dL - - - - >600.0  Micro/Creat Ratio 0.0 - 30.0 mg/g - - - - -  Chol 100 - 199 mg/dL - - 215(H) - 211(H)  HDL >39 mg/dL - - 34(L) - 44(L)  Calc LDL 0 - 99 mg/dL - - 137(H) - 136(H)  Triglycerides 0 - 149 mg/dL - - 244(H) - 179(H)  Creatinine 0.44 - 1.00 mg/dL 2.20(H) 2.27(H) 1.92(H) - 1.49(H)   BP/Weight 04/24/2020 03/27/2020 03/05/2020 02/19/2020 01/18/2020 01/17/2020 [id number]  Systolic BP 123456 0000000 [id number] A999333 - A999333  Diastolic BP 88 [id number] 78 [id number] 66 - 71  Wt. (Lbs) 162 151 149.12 149 - 147 153  BMI 25.37 23.65 23.36 23.34 - 23.02 23.96   Foot/eye exam completion dates 03/05/2020 01/24/2019  Foot Form Completion Done Done

## 2020-05-02 DIAGNOSIS — E1140 Type 2 diabetes mellitus with diabetic neuropathy, unspecified: Secondary | ICD-10-CM | POA: Diagnosis not present

## 2020-05-03 LAB — LIPID PANEL
Chol/HDL Ratio: 4.9 ratio — ABNORMAL HIGH (ref 0.0–4.4)
Cholesterol, Total: 167 mg/dL (ref 100–199)
HDL: 34 mg/dL — ABNORMAL LOW (ref >39)
LDL Chol Calc (NIH): 87 mg/dL (ref 0–99)
Triglycerides: 277 mg/dL — ABNORMAL HIGH (ref 0–149)
VLDL Cholesterol Cal: 46 mg/dL — ABNORMAL HIGH (ref 5–40)

## 2020-05-03 LAB — CMP14+EGFR
ALT: 22 IU/L (ref 0–32)
AST: 16 IU/L (ref 0–40)
Albumin/Globulin Ratio: 1.1 — ABNORMAL LOW (ref 1.2–2.2)
Albumin: 3.3 g/dL — ABNORMAL LOW (ref 3.8–4.9)
Alkaline Phosphatase: 118 IU/L (ref 44–121)
BUN/Creatinine Ratio: 19 (ref 9–23)
BUN: 38 mg/dL — ABNORMAL HIGH (ref 6–24)
Bilirubin Total: <0.2 mg/dL (ref 0.0–1.2)
CO2: 17 mmol/L — ABNORMAL LOW (ref 20–29)
Calcium: 8.5 mg/dL — ABNORMAL LOW (ref 8.7–10.2)
Chloride: 110 mmol/L — ABNORMAL HIGH (ref 96–106)
Creatinine, Ser: 2.02 mg/dL — ABNORMAL HIGH (ref 0.57–1.00)
GFR calc Af Amer: 31 mL/min/{1.73_m2} — ABNORMAL LOW (ref >59)
GFR calc non Af Amer: 27 mL/min/{1.73_m2} — ABNORMAL LOW (ref >59)
Globulin, Total: 2.9 g/dL (ref 1.5–4.5)
Glucose: 159 mg/dL — ABNORMAL HIGH (ref 65–99)
Potassium: 4.7 mmol/L (ref 3.5–5.2)
Sodium: 144 mmol/L (ref 134–144)
Total Protein: 6.2 g/dL (ref 6.0–8.5)

## 2020-05-03 LAB — HEMOGLOBIN A1C
Est. average glucose Bld gHb Est-mCnc: 189 mg/dL
Hgb A1c MFr Bld: 8.2 % — ABNORMAL HIGH (ref 4.8–5.6)

## 2020-05-07 ENCOUNTER — Other Ambulatory Visit: Payer: Self-pay | Admitting: Student

## 2020-05-08 ENCOUNTER — Other Ambulatory Visit: Payer: Self-pay

## 2020-05-08 ENCOUNTER — Ambulatory Visit (HOSPITAL_COMMUNITY)
Admission: RE | Admit: 2020-05-08 | Discharge: 2020-05-08 | Disposition: A | Discharge status: Home / Self Care | Payer: Medicaid Other | Source: Ambulatory Visit | Attending: Nephrology | Admitting: Nephrology

## 2020-05-08 DIAGNOSIS — F17210 Nicotine dependence, cigarettes, uncomplicated: Secondary | ICD-10-CM | POA: Insufficient documentation

## 2020-05-08 DIAGNOSIS — N059 Unspecified nephritic syndrome with unspecified morphologic changes: Secondary | ICD-10-CM | POA: Diagnosis not present

## 2020-05-08 DIAGNOSIS — D638 Anemia in other chronic diseases classified elsewhere: Secondary | ICD-10-CM

## 2020-05-08 DIAGNOSIS — N2581 Secondary hyperparathyroidism of renal origin: Secondary | ICD-10-CM

## 2020-05-08 DIAGNOSIS — R809 Proteinuria, unspecified: Secondary | ICD-10-CM | POA: Diagnosis not present

## 2020-05-08 DIAGNOSIS — E1122 Type 2 diabetes mellitus with diabetic chronic kidney disease: Secondary | ICD-10-CM | POA: Diagnosis not present

## 2020-05-08 DIAGNOSIS — I129 Hypertensive chronic kidney disease with stage 1 through stage 4 chronic kidney disease, or unspecified chronic kidney disease: Secondary | ICD-10-CM | POA: Diagnosis present

## 2020-05-08 DIAGNOSIS — N184 Chronic kidney disease, stage 4 (severe): Secondary | ICD-10-CM | POA: Insufficient documentation

## 2020-05-08 DIAGNOSIS — E872 Acidosis, unspecified: Secondary | ICD-10-CM

## 2020-05-08 DIAGNOSIS — N269 Renal sclerosis, unspecified: Secondary | ICD-10-CM | POA: Insufficient documentation

## 2020-05-08 DIAGNOSIS — R768 Other specified abnormal immunological findings in serum: Secondary | ICD-10-CM

## 2020-05-08 LAB — BASIC METABOLIC PANEL
Anion gap: 9 (ref 5–15)
BUN: 46 mg/dL — ABNORMAL HIGH (ref 6–20)
CO2: 19 mmol/L — ABNORMAL LOW (ref 22–32)
Calcium: 8.6 mg/dL — ABNORMAL LOW (ref 8.9–10.3)
Chloride: 112 mmol/L — ABNORMAL HIGH (ref 98–111)
Creatinine, Ser: 2.46 mg/dL — ABNORMAL HIGH (ref 0.44–1.00)
GFR, Estimated: 22 mL/min — ABNORMAL LOW (ref >60)
Glucose, Bld: 142 mg/dL — ABNORMAL HIGH (ref 70–99)
Potassium: 5 mmol/L (ref 3.5–5.1)
Sodium: 140 mmol/L (ref 135–145)

## 2020-05-08 LAB — CBC
HCT: 33.6 % — ABNORMAL LOW (ref 36.0–46.0)
Hemoglobin: 10.5 g/dL — ABNORMAL LOW (ref 12.0–15.0)
MCH: 28.6 pg (ref 26.0–34.0)
MCHC: 31.3 g/dL (ref 30.0–36.0)
MCV: 91.6 fL (ref 80.0–100.0)
Platelets: 218 10*3/uL (ref 150–400)
RBC: 3.67 MIL/uL — ABNORMAL LOW (ref 3.87–5.11)
RDW: 14.3 % (ref 11.5–15.5)
WBC: 6.9 10*3/uL (ref 4.0–10.5)
nRBC: 0 % (ref 0.0–0.2)

## 2020-05-08 LAB — GLUCOSE, CAPILLARY: Glucose-Capillary: 131 mg/dL — ABNORMAL HIGH (ref 70–99)

## 2020-05-08 LAB — PROTIME-INR
INR: 1.1 (ref 0.8–1.2)
Prothrombin Time: 14 seconds (ref 11.4–15.2)

## 2020-05-08 MED ORDER — MIDAZOLAM HCL 2 MG/2ML IJ SOLN
INTRAMUSCULAR | Status: AC
  Filled 2020-05-08: qty 2

## 2020-05-08 MED ORDER — FENTANYL CITRATE (PF) 100 MCG/2ML IJ SOLN
INTRAMUSCULAR | Status: AC
  Filled 2020-05-08: qty 2

## 2020-05-08 MED ORDER — MIDAZOLAM HCL 2 MG/2ML IJ SOLN
INTRAMUSCULAR | Status: AC | PRN
  Administered 2020-05-08: 1 mg via INTRAVENOUS

## 2020-05-08 MED ORDER — GELATIN ABSORBABLE 12-7 MM EX MISC
CUTANEOUS | Status: AC
  Filled 2020-05-08: qty 1

## 2020-05-08 MED ORDER — HYDRALAZINE HCL 20 MG/ML IJ SOLN
INTRAMUSCULAR | Status: AC
  Filled 2020-05-08: qty 1

## 2020-05-08 MED ORDER — SODIUM CHLORIDE 0.9 % IV SOLN: INTRAVENOUS | Status: DC

## 2020-05-08 MED ORDER — FENTANYL CITRATE (PF) 100 MCG/2ML IJ SOLN
INTRAMUSCULAR | Status: AC | PRN
  Administered 2020-05-08: 50 ug via INTRAVENOUS

## 2020-05-08 MED ORDER — FENTANYL CITRATE (PF) 100 MCG/2ML IJ SOLN
25.0000 ug | Freq: Once | INTRAMUSCULAR | Status: AC
  Administered 2020-05-08: 25 ug via INTRAVENOUS

## 2020-05-08 MED ORDER — HYDRALAZINE HCL 20 MG/ML IJ SOLN
INTRAMUSCULAR | Status: AC | PRN
  Administered 2020-05-08: 10 mg via INTRAVENOUS

## 2020-05-08 MED ORDER — LIDOCAINE HCL (PF) 1 % IJ SOLN
INTRAMUSCULAR | Status: AC
  Filled 2020-05-08: qty 30

## 2020-05-08 NOTE — Progress Notes (Addendum)
Discharge instructions reviewed with pt attempted to call pt daughter no answer x3.

## 2020-05-08 NOTE — Discharge Instructions (Signed)
Percutaneous Kidney Biopsy, Care After This sheet gives you information about how to care for yourself after your procedure. Your health care provider may also give you more specific instructions. If you have problems or questions, contact your health care provider. What can I expect after the procedure? After the procedure, it is common to have:  Pain or soreness near the biopsy site.  Pink or cloudy urine for 24 hours after the procedure. This is normal. Follow these instructions at home: Activity  Return to your normal activities as told by your health care provider. Ask your health care provider what activities are safe for you.  If you were given a sedative during the procedure, it can affect you for several hours. Do not drive or operate machinery until your health care provider says that it is safe.  Do not lift anything that is heavier than 10 lb (4.5 kg), or the limit that you are told, until your health care provider says that it is safe.  Avoid activities that take a lot of effort until your health care provider approves. Most people will have to wait 2 weeks before returning to activities such as exercise or sex. General instructions  Take over-the-counter and prescription medicines only as told by your health care provider.  Follow instructions from your health care provider about eating or drinking restrictions.  Check your biopsy site every day for signs of infection. Check for: ? More redness, swelling, or pain. ? Fluid or blood. ? Warmth. ? Pus or a bad smell.  Keep all follow-up visits as told by your health care provider. This is important.   Contact a health care provider if:  You have more redness, swelling, or pain around your biopsy site.  You have fluid or blood coming from your biopsy site.  Your biopsy site feels warm to the touch.  You have pus or a bad smell coming from your biopsy site.  You have blood in your urine more than 24 hours after your  procedure. Get help right away if:  Your urine is dark red or brown.  You have a fever.  You are not able to urinate.  You feel burning when you urinate.  You feel dizzy or light-headed.  You have severe pain in your abdomen or side. Summary  After the procedure, it is common to have pain or soreness at the biopsy site and pink or cloudy urine for the first 24 hours.  Check your biopsy site each day for signs of infection, such as more redness, swelling, or pain; fluid, blood, pus or a bad smell coming from the biopsy site; or the biopsy site feeling warm to the touch.  Return to your normal activities as told by your health care provider. This information is not intended to replace advice given to you by your health care provider. Make sure you discuss any questions you have with your health care provider. Document Revised: 06/02/2019 Document Reviewed: 06/02/2019 Elsevier Patient Education  2021 [employer]  

## 2020-05-08 NOTE — Procedures (Signed)
Interventional Radiology Procedure Note  Procedure: Random renal biopsy  Complications: None  Estimated Blood Loss: None  Recommendations: - Bedrest x 4 hrs - DC home  Signed,  Criselda Peaches, MD

## 2020-05-08 NOTE — Progress Notes (Signed)
CSW setup transportation for patient to get back to [place] by uber.

## 2020-05-08 NOTE — Progress Notes (Signed)
Rowe Robert informed new orders noted

## 2020-05-08 NOTE — Progress Notes (Addendum)
Attempted to call pt daughter no answer. Social worker called states she will come and speak with the pt. Social worker called uber for pt.

## 2020-05-08 NOTE — Progress Notes (Addendum)
Attempted to call pt daughter 3 more times. Ms Stehlin has tried to call her no answer. Attempted to call Diane (pt Aunt) no answer

## 2020-05-08 NOTE — Progress Notes (Signed)
Pt c/o pain in her left side. Moaning and moving about. Rowe Robert, Pa paged new orders noted

## 2020-05-08 NOTE — Consult Note (Signed)
Chief Complaint: Patient was seen in consultation today for image guided random renal biopsy  Referring Physician(s): Sargeant S  Supervising Physician: Jacqulynn Cadet  Patient Status: [facility] [place] - Out-pt  History of Present Illness: AERICA Walsh is a 60 y.o. female smoker  with past medical history of chronic kidney disease, diabetes, anemia, secondary hyperparathyroidism, substance abuse, anxiety/depression, hypertension, hepatitis C, hyperlipidemia, neuropathy and nephrotic range proteinuria.  She presents today for image guided random renal biopsy for further evaluation.  Recent renal ultrasound was normal.  Past Medical History:  Diagnosis Date  . Allergic rhinitis   . Anxiety   . Chronic back pain   . Chronic hepatitis C without hepatic coma ([place])   . Depression   . Diabetes mellitus   . Hepatitis C   . Hypertension   . Noncompliance   . Poor appetite 07/17/2014  . Substance abuse ([place])    HX of drug use and alcohol use    Past Surgical History:  Procedure Laterality Date  . APPENDECTOMY  2004    Allergies: Penicillins  Medications: Prior to Admission medications   Medication Sig Start Date End Date Taking? Authorizing Provider  amLODipine (NORVASC) 5 MG tablet TAKE ONE TABLET BY MOUTH ONCE DAILY. Patient taking differently: Take 5 mg by mouth daily. 04/15/20  Yes [name], MD  ergocalciferol (VITAMIN D2) 1.25 MG (50000 UT) capsule Take 1 capsule (50,000 Units total) by mouth once a week. One capsule once weekly Patient taking differently: Take 50,000 Units by mouth every Monday. One capsule once weekly 01/10/20  Yes [name], MD  fluticasone [facility]) 50 MCG/ACT nasal spray Place 1 spray into both nostrils daily. 01/18/20 01/17/21 Yes [name], MD  furosemide (LASIX) 20 MG tablet Take one tablet by mouht three times  Weekly, as needed, for leg swelling Patient taking differently: Take 20 mg by mouth 3 (three) times a week.  Take one tablet by mouht three times  Weekly, as needed, for leg swelling 04/24/20  Yes [name], MD  gabapentin (NEURONTIN) 100 MG capsule Take 1 capsule (100 mg total) by mouth 3 (three) times daily. 03/27/20  Yes Mordecai Rasmussen, MD  glipiZIDE (GLUCOTROL XL) 5 MG 24 hr tablet Take 1 tablet (5 mg total) by mouth daily with breakfast. 04/04/19  Yes [name], MD  Ipratropium-Albuterol (COMBIVENT) 20-100 MCG/ACT AERS respimat Inhale 1 puff into the lungs every 6 (six) hours as needed for wheezing. 01/18/20  Yes [name], MD  losartan (COZAAR) 25 MG tablet Take 25 mg by mouth daily.   Yes [provider]  losartan (COZAAR) 50 MG tablet Take 1 tablet (50 mg total) by mouth daily. 04/24/20  Yes [name], MD  mirtazapine (REMERON) 15 MG tablet Take 1 tablet (15 mg total) by mouth at bedtime. 03/05/20  Yes [name], MD  aspirin EC 81 MG tablet Take 1 tablet (81 mg total) by mouth daily. Patient not taking: Reported on 05/02/2020 04/04/19   [name], MD  blood glucose meter kit and supplies Dispense based on patient and insurance preference. Once daily testing dx E11.9. 09/20/18   [name], MD  calcitRIOL (ROCALTROL) 0.25 MCG capsule Take 0.25 mcg by mouth every Monday, Wednesday, and Friday. Patient not taking: Reported on 05/02/2020 02/29/20   [provider]  guaiFENesin-dextromethorphan (ROBITUSSIN DM) 100-10 MG/5ML syrup Take 5 mLs by mouth every 6 (six) hours as needed for cough. Patient not taking: No sig reported 01/18/20  [name], MD  loratadine (CLARITIN) 10 MG tablet Take 1 tablet (10 mg total) by mouth daily. Patient not taking: Reported on 05/02/2020 01/18/20 01/17/21  [name], MD  rosuvastatin (CRESTOR) 5 MG tablet Take 1 tablet (5 mg total) by mouth daily. Patient not taking: Reported on 05/02/2020 01/10/20   [name], MD  sodium bicarbonate 650 MG tablet Take 650 mg by mouth 2 (two) times  daily. Patient not taking: Reported on 05/02/2020 02/29/20   [provider]     Family History  Problem Relation Age of Onset  . Diabetes Sister        Twin - AIDS     Social History   Socioeconomic History  . Marital status: Single    Spouse name: Not on file  . Number of children: 3  . Years of education: Not on file  . Highest education level: Not on file  Occupational History  . Occupation: Disabled  Tobacco Use  . Smoking status: Light Tobacco Smoker    Packs/day: 0.25    Types: Cigarettes  . Smokeless tobacco: Never Used  Substance and Sexual Activity  . Alcohol use: Not Currently    Alcohol/week: 40.0 standard drinks    Types: 40 Cans of beer per week    Comment: 2 16oz cans of beer a day  . Drug use: Not Currently    Types: Marijuana, Cocaine    Comment: Last smoked marijuana yesterday 10/18/14  . Sexual activity: Yes    Birth control/protection: Condom  Other Topics Concern  . Not on file  Social History Narrative  . Not on file   Social Determinants of Health   Financial Resource Strain: Not on file  Food Insecurity: Not on file  Transportation Needs: Not on file  Physical Activity: Not on file  Stress: Not on file  Social Connections: Not on file    Review of Systems currently denies fever, headache, chest pain, dyspnea, cough, abdominal/back pain, nausea, vomiting or bleeding  Vital Signs: BP (!) 143/91   Pulse 95   Temp 98.3 F (36.8 C) (Oral)   Resp 18   Ht 5' 6"  (1.676 m)   Wt 162 lb (73.5 kg)   LMP 06/23/2010   SpO2 100%   BMI 26.15 kg/m   Physical Exam awake, alert.  Chest clear to auscultation bilaterally.  Heart with regular rate and rhythm.  Abdomen soft, positive bowel sounds, nontender.  Pretibial edema noted bilaterally.  Imaging: No results found.  Labs:  CBC: Recent Labs    01/17/20 1420 01/18/20 0510 02/19/20 1057 05/08/20 0638  WBC 8.8 12.5* 8.5 6.9  HGB 10.5* 9.1* 10.8* 10.5*  HCT 32.5* 28.9* 33.3*  33.6*  PLT 152 121* 201 218    COAGS: Recent Labs    05/08/20 0638  INR 1.1    BMP: Recent Labs    01/05/20 1132 01/17/20 1420 01/18/20 0510 05/02/20 1535  NA 141 138 136 144  K 4.5 3.6 4.1 4.7  CL 108* 107 108 110*  CO2 21 21* 20* 17*  GLUCOSE 94 70 170* 159*  BUN 33* 30* 28* 38*  CALCIUM 8.5* 8.1* 7.3* 8.5*  CREATININE 1.92* 2.27* 2.20* 2.02*  GFRNONAA 29* 25* 26* 27*  GFRAA 33*  --   --  31*    LIVER FUNCTION TESTS: Recent Labs    01/05/20 1132 01/17/20 1420 05/02/20 1535  BILITOT <0.2 0.8 <0.2  AST 19 23 16   ALT 25 17 22   ALKPHOS 141* 74  118  PROT 6.2 6.3* 6.2  ALBUMIN 3.0* 2.5* 3.3*    TUMOR MARKERS: No results for input(s): AFPTM, CEA, CA199, CHROMGRNA in the last 8760 hours.  Assessment and Plan: 60 y.o. female smoker  with past medical history of chronic kidney disease, diabetes, anemia, secondary hyperparathyroidism, substance abuse, anxiety/depression, hypertension, hepatitis C, hyperlipidemia, neuropathy and nephrotic range proteinuria.  She presents today for image guided random renal biopsy for further evaluation.  Recent renal ultrasound was normal.Risks and benefits of procedure was discussed with the patient  including, but not limited to bleeding, infection, damage to adjacent structures or low yield requiring additional tests.  All of the questions were answered and there is agreement to proceed.  Consent signed and in chart.    Thank you for this interesting consult.  I greatly enjoyed meeting Latoya Walsh and look forward to participating in their care.  A copy of this report was sent to the requesting provider on this date.  Electronically Signed: D. Rowe Robert, PA-C 05/08/2020, 7:41 AM   I spent a total of 25 minutes   in face to face in clinical consultation, greater than 50% of which was counseling/coordinating care for image guided random renal biopsy

## 2020-05-09 ENCOUNTER — Other Ambulatory Visit: Payer: Self-pay | Admitting: Family Medicine

## 2020-05-09 MED ORDER — GLIPIZIDE ER 10 MG PO TB24: 10.0000 mg | ORAL_TABLET | Freq: Every day | ORAL | 1 refills | Status: DC

## 2020-05-09 MED ORDER — ROSUVASTATIN CALCIUM 10 MG PO TABS: 10.0000 mg | ORAL_TABLET | Freq: Every day | ORAL | 3 refills | Status: DC

## 2020-05-15 ENCOUNTER — Other Ambulatory Visit: Payer: Self-pay | Admitting: Family Medicine

## 2020-05-21 ENCOUNTER — Ambulatory Visit: Payer: Medicaid Other | Admitting: Orthopedic Surgery

## 2020-05-21 ENCOUNTER — Encounter (HOSPITAL_COMMUNITY): Payer: Self-pay

## 2020-05-21 LAB — SURGICAL PATHOLOGY

## 2020-05-22 ENCOUNTER — Ambulatory Visit: Payer: Medicaid Other | Admitting: Internal Medicine

## 2020-05-22 ENCOUNTER — Encounter (HOSPITAL_COMMUNITY): Payer: Self-pay | Admitting: Nephrology

## 2020-05-24 ENCOUNTER — Encounter: Payer: Self-pay | Admitting: Orthopedic Surgery

## 2020-05-24 ENCOUNTER — Ambulatory Visit: Payer: Medicaid Other

## 2020-05-24 ENCOUNTER — Other Ambulatory Visit: Payer: Self-pay

## 2020-05-24 ENCOUNTER — Ambulatory Visit (INDEPENDENT_AMBULATORY_CARE_PROVIDER_SITE_OTHER): Payer: Medicaid Other | Admitting: Orthopedic Surgery

## 2020-05-24 VITALS — BP 149/74 | HR 103 | Ht 67.0 in | Wt 178.0 lb

## 2020-05-24 DIAGNOSIS — M25562 Pain in left knee: Secondary | ICD-10-CM | POA: Diagnosis not present

## 2020-05-24 DIAGNOSIS — M25561 Pain in right knee: Secondary | ICD-10-CM

## 2020-05-24 DIAGNOSIS — M79605 Pain in left leg: Secondary | ICD-10-CM

## 2020-05-24 DIAGNOSIS — G8929 Other chronic pain: Secondary | ICD-10-CM

## 2020-05-24 DIAGNOSIS — M79604 Pain in right leg: Secondary | ICD-10-CM

## 2020-05-24 NOTE — Progress Notes (Addendum)
Orthopaedic Clinic Return  Assessment: Latoya Walsh is a 60 y.o. female with the following: Bilateral diffuse leg pain  Plan: She is complaining of leg pain and swelling that started following a recent kidney biopsy.  She has pitting edema in bilateral lower extremities distal to her knee.  She has some striations in bilateral legs, and the skin has the appearance of wood.  She does have some difficulty with range of motion of bilateral knees.  She also is tenderness about the knees.  X-rays demonstrate no acute injuries.  In addition, she states that she has very little energy, and has difficulty breathing at times.  She is unaware of the kidney biopsy results.  As a result, I contacted her primary care provider Dr. Joycelyn Schmid Simpson's office, to relay my concerns.  Dr. Griffin Dakin office has contacted the patient and recommended she present to the emergency department for evaluation.  In the future, if she has isolated knee pain, I am happy to see the patient to provide her with some assistance, otherwise I do not have any further treatment options at this time.  This was discussed with the patient, and she is in agreement.  She plans to go immediately to the emergency department for evaluation.   Body mass index is 27.88 kg/m.  Follow-up: Return if symptoms worsen or fail to improve.   Subjective:  Chief Complaint  Patient presents with  . Knee Pain    Bilateral knee pain, Patient reports both are bad,     History of Present Illness: Latoya Walsh is a 60 y.o. female who presents for evaluation of bilateral knee pain.  She states that she has had pain extending from her abdomen into both legs distal to her knees, ever since she had a kidney biopsy.  She is also complaining of general fatigue, malaise and difficulty breathing at times.  The pain is "all over".  She has noted some puffiness throughout her legs.  She is not aware of a diagnosis following the biopsy, as she has not heard  from Dr. Griffin Dakin office.  No recent injuries to her knees.  Review of Systems: Difficulty breathing Fatigue No fevers or chills No numbness or tingling  Objective: BP (!) 149/74   Pulse (!) 103   Ht '5\' 7"'$  (1.702 m)   Wt 178 lb (80.7 kg)   LMP 06/23/2010   BMI 27.88 kg/m   Physical Exam:  Ill-appearing female, in no acute distress. She walks very slowly using a cane to assist with ambulation  Evaluation of bilateral lower extremities demonstrates diffuse swelling, distal to her knees.  She does have pitting edema around both of her knees.  This is tender to palpation.  There are similar tenderness palpation over the anterior shin, and extending proximally into her thighs.  She tolerates near full range of motion of her knees, although she does note some pain with flexion.  She does have some striations in the posterior aspect of both legs, as well as woody appearing skin.  There is no induration, or areas of fluctuance.  IMAGING: I personally ordered and reviewed the following images:  X-rays of bilateral knees were obtained in clinic today and demonstrates maintenance of joint space overall.  Minimal degenerative changes are appreciated.  Neutral alignment bilateral.  No acute injuries are noted.  Calcification of the vasculature in the posterior leg is noted.  Impression: Normal appearing bilateral knee x-rays   Mordecai Rasmussen, MD 05/24/2020 12:44 PM

## 2020-06-06 ENCOUNTER — Other Ambulatory Visit: Payer: Self-pay

## 2020-06-06 ENCOUNTER — Encounter (HOSPITAL_COMMUNITY): Payer: Self-pay | Admitting: *Deleted

## 2020-06-06 ENCOUNTER — Inpatient Hospital Stay (HOSPITAL_COMMUNITY)
Admission: EM | Admit: 2020-06-06 | Discharge: 2020-06-10 | DRG: 291 | Disposition: A | Discharge status: Home / Self Care | Payer: Medicaid Other | Attending: Internal Medicine | Admitting: Internal Medicine

## 2020-06-06 ENCOUNTER — Emergency Department (HOSPITAL_COMMUNITY): Payer: Medicaid Other

## 2020-06-06 DIAGNOSIS — D631 Anemia in chronic kidney disease: Secondary | ICD-10-CM | POA: Diagnosis not present

## 2020-06-06 DIAGNOSIS — N189 Chronic kidney disease, unspecified: Secondary | ICD-10-CM | POA: Diagnosis not present

## 2020-06-06 DIAGNOSIS — Z79899 Other long term (current) drug therapy: Secondary | ICD-10-CM | POA: Diagnosis not present

## 2020-06-06 DIAGNOSIS — Z833 Family history of diabetes mellitus: Secondary | ICD-10-CM | POA: Diagnosis not present

## 2020-06-06 DIAGNOSIS — E785 Hyperlipidemia, unspecified: Secondary | ICD-10-CM | POA: Diagnosis present

## 2020-06-06 DIAGNOSIS — E1122 Type 2 diabetes mellitus with diabetic chronic kidney disease: Secondary | ICD-10-CM | POA: Diagnosis present

## 2020-06-06 DIAGNOSIS — Z8619 Personal history of other infectious and parasitic diseases: Secondary | ICD-10-CM

## 2020-06-06 DIAGNOSIS — Z880 Allergy status to penicillin: Secondary | ICD-10-CM | POA: Diagnosis not present

## 2020-06-06 DIAGNOSIS — I5021 Acute systolic (congestive) heart failure: Secondary | ICD-10-CM | POA: Diagnosis present

## 2020-06-06 DIAGNOSIS — N1832 Chronic kidney disease, stage 3b: Secondary | ICD-10-CM | POA: Diagnosis present

## 2020-06-06 DIAGNOSIS — R609 Edema, unspecified: Secondary | ICD-10-CM

## 2020-06-06 DIAGNOSIS — R0602 Shortness of breath: Secondary | ICD-10-CM | POA: Diagnosis not present

## 2020-06-06 DIAGNOSIS — D649 Anemia, unspecified: Secondary | ICD-10-CM | POA: Diagnosis present

## 2020-06-06 DIAGNOSIS — R188 Other ascites: Secondary | ICD-10-CM | POA: Diagnosis not present

## 2020-06-06 DIAGNOSIS — E1165 Type 2 diabetes mellitus with hyperglycemia: Secondary | ICD-10-CM | POA: Diagnosis present

## 2020-06-06 DIAGNOSIS — I517 Cardiomegaly: Secondary | ICD-10-CM | POA: Diagnosis not present

## 2020-06-06 DIAGNOSIS — I130 Hypertensive heart and chronic kidney disease with heart failure and stage 1 through stage 4 chronic kidney disease, or unspecified chronic kidney disease: Principal | ICD-10-CM | POA: Diagnosis present

## 2020-06-06 DIAGNOSIS — I10 Essential (primary) hypertension: Secondary | ICD-10-CM | POA: Diagnosis not present

## 2020-06-06 DIAGNOSIS — F17210 Nicotine dependence, cigarettes, uncomplicated: Secondary | ICD-10-CM | POA: Diagnosis present

## 2020-06-06 DIAGNOSIS — I361 Nonrheumatic tricuspid (valve) insufficiency: Secondary | ICD-10-CM | POA: Diagnosis not present

## 2020-06-06 DIAGNOSIS — I340 Nonrheumatic mitral (valve) insufficiency: Secondary | ICD-10-CM | POA: Diagnosis not present

## 2020-06-06 DIAGNOSIS — F32A Depression, unspecified: Secondary | ICD-10-CM | POA: Diagnosis present

## 2020-06-06 DIAGNOSIS — R0600 Dyspnea, unspecified: Secondary | ICD-10-CM | POA: Diagnosis not present

## 2020-06-06 DIAGNOSIS — E1140 Type 2 diabetes mellitus with diabetic neuropathy, unspecified: Secondary | ICD-10-CM | POA: Diagnosis present

## 2020-06-06 DIAGNOSIS — R600 Localized edema: Secondary | ICD-10-CM | POA: Diagnosis not present

## 2020-06-06 DIAGNOSIS — K8020 Calculus of gallbladder without cholecystitis without obstruction: Secondary | ICD-10-CM | POA: Diagnosis not present

## 2020-06-06 DIAGNOSIS — B182 Chronic viral hepatitis C: Secondary | ICD-10-CM | POA: Diagnosis not present

## 2020-06-06 DIAGNOSIS — F419 Anxiety disorder, unspecified: Secondary | ICD-10-CM | POA: Diagnosis not present

## 2020-06-06 DIAGNOSIS — I509 Heart failure, unspecified: Secondary | ICD-10-CM

## 2020-06-06 DIAGNOSIS — E1121 Type 2 diabetes mellitus with diabetic nephropathy: Secondary | ICD-10-CM | POA: Diagnosis not present

## 2020-06-06 LAB — COMPREHENSIVE METABOLIC PANEL
ALT: 20 U/L (ref 0–44)
AST: 26 U/L (ref 15–41)
Albumin: 2.7 g/dL — ABNORMAL LOW (ref 3.5–5.0)
Alkaline Phosphatase: 87 U/L (ref 38–126)
Anion gap: 7 (ref 5–15)
BUN: 27 mg/dL — ABNORMAL HIGH (ref 6–20)
CO2: 19 mmol/L — ABNORMAL LOW (ref 22–32)
Calcium: 8.2 mg/dL — ABNORMAL LOW (ref 8.9–10.3)
Chloride: 114 mmol/L — ABNORMAL HIGH (ref 98–111)
Creatinine, Ser: 2.43 mg/dL — ABNORMAL HIGH (ref 0.44–1.00)
GFR, Estimated: 23 mL/min — ABNORMAL LOW (ref >60)
Glucose, Bld: 247 mg/dL — ABNORMAL HIGH (ref 70–99)
Potassium: 4.2 mmol/L (ref 3.5–5.1)
Sodium: 140 mmol/L (ref 135–145)
Total Bilirubin: 0.5 mg/dL (ref 0.3–1.2)
Total Protein: 6.1 g/dL — ABNORMAL LOW (ref 6.5–8.1)

## 2020-06-06 LAB — CBC
HCT: 35.2 % — ABNORMAL LOW (ref 36.0–46.0)
Hemoglobin: 10.6 g/dL — ABNORMAL LOW (ref 12.0–15.0)
MCH: 27.4 pg (ref 26.0–34.0)
MCHC: 30.1 g/dL (ref 30.0–36.0)
MCV: 91 fL (ref 80.0–100.0)
Platelets: 206 10*3/uL (ref 150–400)
RBC: 3.87 MIL/uL (ref 3.87–5.11)
RDW: 14.5 % (ref 11.5–15.5)
WBC: 6.9 10*3/uL (ref 4.0–10.5)
nRBC: 0 % (ref 0.0–0.2)

## 2020-06-06 LAB — GLUCOSE, CAPILLARY: Glucose-Capillary: 198 mg/dL — ABNORMAL HIGH (ref 70–99)

## 2020-06-06 LAB — BRAIN NATRIURETIC PEPTIDE: B Natriuretic Peptide: 1241 pg/mL — ABNORMAL HIGH (ref 0.0–100.0)

## 2020-06-06 LAB — TROPONIN I (HIGH SENSITIVITY): Troponin I (High Sensitivity): 30 ng/L — ABNORMAL HIGH (ref <18)

## 2020-06-06 MED ORDER — HYDRALAZINE HCL 20 MG/ML IJ SOLN
5.0000 mg | Freq: Four times a day (QID) | INTRAMUSCULAR | Status: DC | PRN
  Administered 2020-06-06 – 2020-06-07 (×3): 5 mg via INTRAVENOUS
  Filled 2020-06-06 (×3): qty 1

## 2020-06-06 MED ORDER — HEPARIN SODIUM (PORCINE) 5000 UNIT/ML IJ SOLN
5000.0000 [IU] | Freq: Three times a day (TID) | INTRAMUSCULAR | Status: DC
  Administered 2020-06-06 – 2020-06-10 (×12): 5000 [IU] via SUBCUTANEOUS
  Filled 2020-06-06 (×12): qty 1

## 2020-06-06 MED ORDER — MIRTAZAPINE 15 MG PO TABS
15.0000 mg | ORAL_TABLET | Freq: Every day | ORAL | Status: DC
Start: 2020-06-06 — End: 2020-06-10
  Administered 2020-06-06 – 2020-06-09 (×4): 15 mg via ORAL
  Filled 2020-06-06 (×5): qty 1

## 2020-06-06 MED ORDER — SODIUM CHLORIDE 0.9 % IV SOLN: 250.0000 mL | INTRAVENOUS | Status: DC | PRN

## 2020-06-06 MED ORDER — AMLODIPINE BESYLATE 5 MG PO TABS
10.0000 mg | ORAL_TABLET | Freq: Every day | ORAL | Status: DC
  Administered 2020-06-07: 10 mg via ORAL
  Filled 2020-06-06: qty 2

## 2020-06-06 MED ORDER — FUROSEMIDE 10 MG/ML IJ SOLN
40.0000 mg | Freq: Once | INTRAMUSCULAR | Status: AC
  Administered 2020-06-06: 40 mg via INTRAVENOUS
  Filled 2020-06-06: qty 4

## 2020-06-06 MED ORDER — INSULIN ASPART 100 UNIT/ML ~~LOC~~ SOLN
0.0000 [IU] | Freq: Three times a day (TID) | SUBCUTANEOUS | Status: DC
  Administered 2020-06-07: 2 [IU] via SUBCUTANEOUS
  Administered 2020-06-08: 3 [IU] via SUBCUTANEOUS
  Administered 2020-06-08 – 2020-06-09 (×2): 2 [IU] via SUBCUTANEOUS
  Administered 2020-06-10 (×2): 3 [IU] via SUBCUTANEOUS

## 2020-06-06 MED ORDER — AMLODIPINE BESYLATE 5 MG PO TABS
5.0000 mg | ORAL_TABLET | Freq: Every day | ORAL | Status: DC
  Administered 2020-06-06: 5 mg via ORAL
  Filled 2020-06-06: qty 1

## 2020-06-06 MED ORDER — SODIUM BICARBONATE 650 MG PO TABS
650.0000 mg | ORAL_TABLET | Freq: Two times a day (BID) | ORAL | Status: DC
  Administered 2020-06-06 – 2020-06-10 (×8): 650 mg via ORAL
  Filled 2020-06-06 (×10): qty 1

## 2020-06-06 MED ORDER — SODIUM CHLORIDE 0.9% FLUSH
3.0000 mL | Freq: Two times a day (BID) | INTRAVENOUS | Status: DC
  Administered 2020-06-06 – 2020-06-10 (×7): 3 mL via INTRAVENOUS

## 2020-06-06 MED ORDER — ONDANSETRON HCL 4 MG/2ML IJ SOLN: 4.0000 mg | Freq: Four times a day (QID) | INTRAMUSCULAR | Status: DC | PRN

## 2020-06-06 MED ORDER — GLIPIZIDE ER 5 MG PO TB24
10.0000 mg | ORAL_TABLET | Freq: Every day | ORAL | Status: DC
  Administered 2020-06-07 – 2020-06-10 (×4): 10 mg via ORAL
  Filled 2020-06-06 (×4): qty 2

## 2020-06-06 MED ORDER — SODIUM CHLORIDE 0.9% FLUSH: 3.0000 mL | INTRAVENOUS | Status: DC | PRN

## 2020-06-06 MED ORDER — ROSUVASTATIN CALCIUM 10 MG PO TABS
10.0000 mg | ORAL_TABLET | Freq: Every day | ORAL | Status: DC
  Administered 2020-06-06 – 2020-06-10 (×5): 10 mg via ORAL
  Filled 2020-06-06 (×5): qty 1

## 2020-06-06 MED ORDER — INSULIN ASPART 100 UNIT/ML ~~LOC~~ SOLN
0.0000 [IU] | Freq: Every day | SUBCUTANEOUS | Status: DC
  Administered 2020-06-08 – 2020-06-09 (×2): 2 [IU] via SUBCUTANEOUS

## 2020-06-06 MED ORDER — LOSARTAN POTASSIUM 25 MG PO TABS
25.0000 mg | ORAL_TABLET | Freq: Every day | ORAL | Status: DC
  Filled 2020-06-06: qty 1

## 2020-06-06 MED ORDER — NITROGLYCERIN 2 % TD OINT
1.0000 [in_us] | TOPICAL_OINTMENT | Freq: Once | TRANSDERMAL | Status: AC
  Administered 2020-06-06: 1 [in_us] via TOPICAL
  Filled 2020-06-06: qty 1

## 2020-06-06 MED ORDER — LOSARTAN POTASSIUM 25 MG PO TABS
50.0000 mg | ORAL_TABLET | Freq: Every day | ORAL | Status: DC
  Filled 2020-06-06: qty 2

## 2020-06-06 MED ORDER — FUROSEMIDE 10 MG/ML IJ SOLN
40.0000 mg | Freq: Two times a day (BID) | INTRAMUSCULAR | Status: DC
  Administered 2020-06-06 – 2020-06-10 (×8): 40 mg via INTRAVENOUS
  Filled 2020-06-06 (×8): qty 4

## 2020-06-06 MED ORDER — LOSARTAN POTASSIUM 50 MG PO TABS: 75.0000 mg | ORAL_TABLET | Freq: Every day | ORAL | Status: DC

## 2020-06-06 MED ORDER — ACETAMINOPHEN 325 MG PO TABS
650.0000 mg | ORAL_TABLET | ORAL | Status: DC | PRN
  Administered 2020-06-07 – 2020-06-10 (×3): 650 mg via ORAL
  Filled 2020-06-06 (×3): qty 2

## 2020-06-06 NOTE — H&P (Incomplete)
TRH H&P   Patient Demographics:    Latoya Walsh, is a 60 y.o. female  MRN: [id number]   DOB - Feb 12, 1961  Admit Date - 06/06/2020  Outpatient Primary MD for the patient is [name], MD  Referring MD/NP/PA: ***  Outpatient Specialists: ***    Patient coming from: ***  Chief Complaint  Patient presents with  . Shortness of Breath      HPI:    Latoya Walsh  is a 60 y.o. female, ******    Review of systems:    In addition to the HPI above, **** No Fever-chills, No Headache, No changes with Vision or hearing, No problems swallowing food or Liquids, No Chest pain, Cough or Shortness of Breath, No Abdominal pain, No Nausea or Vommitting, Bowel movements are regular, No Blood in stool or Urine, No dysuria, No new skin rashes or bruises, No new joints pains-aches,  No new weakness, tingling, numbness in any extremity, No recent weight gain or loss, No polyuria, polydypsia or polyphagia, No significant Mental Stressors.  A full 10 point Review of Systems was done, except as stated above, all other Review of Systems were negative.   With Past History of the following :    Past Medical History:  Diagnosis Date  . Allergic rhinitis   . Anxiety   . Chronic back pain   . Chronic hepatitis C without hepatic coma ([place])   . Depression   . Diabetes mellitus   . Hepatitis C   . Hypertension   . Noncompliance   . Poor appetite 07/17/2014  . Substance abuse ([place])    HX of drug use and alcohol use      Past Surgical History:  Procedure Laterality Date  . APPENDECTOMY  2004      Social History:     Social History   Tobacco Use  . Smoking status: Light Tobacco Smoker    Packs/day: 0.25    Types: Cigarettes  . Smokeless tobacco: Never Used  Substance Use Topics  . Alcohol use: Not Currently    Alcohol/week: 40.0 standard drinks    Types: 40  Cans of beer per week    Comment: 2 16oz cans of beer a day     Lives -   Mobility -   ********   Family History :     Family History  Problem Relation Age of Onset  . Diabetes Sister        Twin - AIDS    ********   Home Medications:   Prior to Admission medications   Medication Sig Start Date End Date Taking? Authorizing Provider  amLODipine (NORVASC) 5 MG tablet TAKE ONE TABLET BY MOUTH ONCE DAILY. Patient taking differently: Take 5 mg by mouth daily. 04/15/20  Yes [name], MD  blood glucose meter kit and supplies Dispense based on patient and insurance preference. Once daily testing  dx E11.9. 09/20/18  Yes [name], MD  calcitRIOL (ROCALTROL) 0.25 MCG capsule Take 0.25 mcg by mouth every Monday, Wednesday, and Friday. 02/29/20  Yes [provider]  ergocalciferol (VITAMIN D2) 1.25 MG (50000 UT) capsule Take 1 capsule (50,000 Units total) by mouth once a week. One capsule once weekly Patient taking differently: Take 50,000 Units by mouth every Monday. One capsule once weekly 01/10/20  Yes [name], MD  fluticasone [facility]) 50 MCG/ACT nasal spray Place 1 spray into both nostrils daily. 01/18/20 01/17/21 Yes [name], MD  furosemide (LASIX) 20 MG tablet TAKE (1) TABLET BY MOUTH THREE TIMES WEEKLY AS NEEDED FOR LEG SWELIING 05/15/20  Yes [name], MD  gabapentin (NEURONTIN) 100 MG capsule Take 1 capsule (100 mg total) by mouth 3 (three) times daily. 03/27/20  Yes Mordecai Rasmussen, MD  glipiZIDE (GLUCOTROL XL) 10 MG 24 hr tablet Take 1 tablet (10 mg total) by mouth daily with breakfast. 05/09/20  Yes [name], MD  guaiFENesin-dextromethorphan (ROBITUSSIN DM) 100-10 MG/5ML syrup Take 5 mLs by mouth every 6 (six) hours as needed for cough. 01/18/20  Yes [name], MD  Ipratropium-Albuterol (COMBIVENT) 20-100 MCG/ACT AERS respimat Inhale 1 puff into the lungs every 6 (six) hours as needed for wheezing. 01/18/20  Yes  [name], MD  loratadine (CLARITIN) 10 MG tablet Take 1 tablet (10 mg total) by mouth daily. 01/18/20 01/17/21 Yes [name], MD  losartan (COZAAR) 25 MG tablet Take 25 mg by mouth daily. Takes with 50 mg. (Total 75 mg daily)   Yes [provider]  losartan (COZAAR) 50 MG tablet Take 1 tablet (50 mg total) by mouth daily. Patient taking differently: Take 50 mg by mouth daily. Takes with 25 mg. (Total 75 mg daily) 04/24/20  Yes [name], MD  mirtazapine (REMERON) 15 MG tablet Take 1 tablet (15 mg total) by mouth at bedtime. 03/05/20  Yes [name], MD  rosuvastatin (CRESTOR) 10 MG tablet Take 1 tablet (10 mg total) by mouth daily. 05/09/20  Yes [name], MD  sodium bicarbonate 650 MG tablet Take 650 mg by mouth 2 (two) times daily. 02/29/20  Yes [provider]     Allergies:     Allergies  Allergen Reactions  . Penicillins Itching and Rash     Physical Exam:   Vitals  Blood pressure (!) 190/119, pulse (!) 102, temperature 98 F (36.7 C), temperature source Oral, resp. rate (!) 32, last menstrual period 06/23/2010, SpO2 100 %.   1. General ******* lying in bed in NAD,  ***********  2. Normal affect and insight, Not Suicidal or Homicidal, Awake Alert, Oriented X 3.  3. No F.N deficits, ALL C.Nerves Intact, Strength 5/5 all 4 extremities, Sensation intact all 4 extremities, Plantars down going.  4. Ears and Eyes appear Normal, Conjunctivae clear, PERRLA. Moist Oral Mucosa.  5. Supple Neck, No JVD, No cervical lymphadenopathy appriciated, No Carotid Bruits.  6. Symmetrical Chest wall movement, Good air movement bilaterally, CTAB.  7. RRR, No Gallops, Rubs or Murmurs, No Parasternal Heave.  8. Positive Bowel Sounds, Abdomen Soft, No tenderness, No organomegaly appriciated,No rebound -guarding or rigidity.  9.  No Cyanosis, Normal Skin Turgor, No Skin Rash or Bruise.  10. Good muscle tone,  joints appear normal , no  effusions, Normal ROM.  11. No Palpable Lymph Nodes in Neck or Axillae  **************   Data Review:    CBC Recent Labs  Lab 06/06/20  1447  WBC 6.9  HGB 10.6*  HCT 35.2*  PLT 206  MCV 91.0  MCH 27.4  MCHC 30.1  RDW 14.5   ------------------------------------------------------------------------------------------------------------------  Chemistries  Recent Labs  Lab 06/06/20 1447  NA 140  K 4.2  CL 114*  CO2 19*  GLUCOSE 247*  BUN 27*  CREATININE 2.43*  CALCIUM 8.2*  AST 26  ALT 20  ALKPHOS 87  BILITOT 0.5   ------------------------------------------------------------------------------------------------------------------ estimated creatinine clearance is 27.9 mL/min (A) (by C-G formula based on SCr of 2.43 mg/dL (H)). ------------------------------------------------------------------------------------------------------------------ No results for input(s): TSH, T4TOTAL, T3FREE, THYROIDAB in the last 72 hours.  Invalid input(s): FREET3  Coagulation profile No results for input(s): INR, PROTIME in the last 168 hours. ------------------------------------------------------------------------------------------------------------------- No results for input(s): DDIMER in the last 72 hours. -------------------------------------------------------------------------------------------------------------------  Cardiac Enzymes No results for input(s): CKMB, TROPONINI, MYOGLOBIN in the last 168 hours.  Invalid input(s): CK ------------------------------------------------------------------------------------------------------------------    Component Value Date/Time   BNP 1,241.0 (H) 06/06/2020 1447     ---------------------------------------------------------------------------------------------------------------  Urinalysis    Component Value Date/Time   COLORURINE YELLOW 01/17/2020 1648   APPEARANCEUR CLEAR 01/17/2020 1648   LABSPEC 1.011 01/17/2020 1648    PHURINE 6.0 01/17/2020 1648   GLUCOSEU NEGATIVE 01/17/2020 1648   HGBUR SMALL (A) 01/17/2020 1648   BILIRUBINUR NEGATIVE 01/17/2020 1648   KETONESUR NEGATIVE 01/17/2020 1648   PROTEINUR >=300 (A) 01/17/2020 1648   UROBILINOGEN 0.2 10/19/2014 1023   NITRITE NEGATIVE 01/17/2020 1648   LEUKOCYTESUR SMALL (A) 01/17/2020 1648    ----------------------------------------------------------------------------------------------------------------   Imaging Results:    DG Chest Port 1 View  Result Date: 06/06/2020 CLINICAL DATA:  Shortness of breath with lower extremity edema EXAM: PORTABLE CHEST 1 VIEW COMPARISON:  January 17, 2020 FINDINGS: There is ill-defined airspace opacity in the right base. The lungs elsewhere are clear. Heart is enlarged with pulmonary vascularity normal. No adenopathy. There is aortic atherosclerosis. There is lower thoracic dextroscoliosis. No bone lesions. IMPRESSION: Ill-defined opacity right base concerning for pneumonia. Lungs elsewhere clear. Cardiomegaly present. No adenopathy. Aortic Atherosclerosis (ICD10-I70.0). Electronically Signed   By: Lowella Grip III M.D.   On: 06/06/2020 15:18   [place] ASCITES (ABDOMEN LIMITED)  Result Date: 06/06/2020 CLINICAL DATA:  Edema question ascites EXAM: LIMITED ABDOMEN ULTRASOUND FOR ASCITES TECHNIQUE: Limited ultrasound survey for ascites was performed in all four abdominal quadrants. COMPARISON:  Chest radiograph 06/06/2020 FINDINGS: No significant ascites identified. Small pleural effusions are noted at the lung bases bilaterally. Multiple shadowing calculi within gallbladder. IMPRESSION: No evidence of ascites. Small pleural effusions. Cholelithiasis. Electronically Signed   By: Lavonia Dana M.D.   On: 06/06/2020 15:41    My personal review of EKG: Rhythm NSR, Rate  ** /min, QTc *** , no Acute ST changes   Assessment & Plan:    Active Problems:   Controlled type 2 diabetes with neuropathy (HCC)   Anxiety and depression    Chronic hepatitis C without hepatic coma (HCC)   Anemia   Acute CHF (congestive heart failure) (HCC)    Hypertension Hepc  alcohol   DVT Prophylaxis Heparin -  Lovenox - SCDs ***  AM Labs Ordered, also please review Full Orders  Family Communication: Admission, patients condition and plan of care including tests being ordered have been discussed with the patient and **** who indicate understanding and agree with the plan and Code Status.  Code Status ***  Likely DC to  ***  Condition GUARDED  *******  Consults called: ***    Admission  status: ***    Time spent in minutes : Phillips Climes M.D on 06/06/2020 at 6:49 PM   [facility] - Office  [phone]

## 2020-06-06 NOTE — ED Triage Notes (Signed)
Short of breath for 2 months

## 2020-06-06 NOTE — ED Provider Notes (Signed)
[facility] EMERGENCY DEPARTMENT Provider Note   CSN: [id number] Arrival date & time: 06/06/20  1354     History Chief Complaint  Patient presents with  . Shortness of Breath    Latoya Walsh is a 60 y.o. female.  HPI   Patient presents to the ED for evaluation of leg swelling and shortness of breath.  Patient states she has been having issues with shortness of breath for several months.  Patient notices it whenever she is up and active and it has been progressing to be more constant.  She has also noticed leg swelling.  Patient has a slight cough but denies any chest pain.  No fevers or chills.  According to the records patient had a video visit with her doctor in February.  She was started on diuretic medications.  Patient also saw an orthopedic doctor earlier this month that noticed her edema.  Patient symptoms were felt not to be orthopedic in nature and recommend follow-up with her doctor.  According to the notes the patient was recommended to go to the ED at that time.  Patient denies any abdominal pain.   Past Medical History:  Diagnosis Date  . Allergic rhinitis   . Anxiety   . Chronic back pain   . Chronic hepatitis C without hepatic coma ([place])   . Depression   . Diabetes mellitus   . Hepatitis C   . Hypertension   . Noncompliance   . Poor appetite 07/17/2014  . Substance abuse ([place])    HX of drug use and alcohol use    Patient Active Problem List   Diagnosis Date Noted  . Leg swelling 04/28/2020  . Hyperlipidemia LDL goal <100 04/28/2020  . Left hand pain 03/10/2020  . Anemia 02/19/2020  . Cough   . Acute renal failure superimposed on stage 3b chronic kidney disease ([place]) 01/17/2020  . COVID-19 virus infection 12/26/2019  . Counseled about COVID-19 virus infection 10/06/2019  . Depression, major, single episode, in partial remission ([place]) 01/25/2019  . Headache 11/13/2018  . Insomnia 09/25/2018  . Screening for colorectal cancer 07/23/2016  . Lumbar back  pain with radiculopathy affecting right lower extremity 03/27/2015  . Non compliance with medical treatment 07/22/2014  . Chronic hepatitis C without hepatic coma ([place]) 12/04/2013  . Allergic rhinitis 11/03/2012  . HTN, goal below 130/80 06/25/2011  . Anxiety and depression 02/05/2011  . Nicotine dependence 11/01/2010  . Drug dependence, continuous abuse ([place]) 03/22/2010  . Controlled type 2 diabetes with neuropathy ([place]) 06/02/2007  . Alcohol abuse 06/02/2007    Past Surgical History:  Procedure Laterality Date  . APPENDECTOMY  2004     OB History   No obstetric history on file.     Family History  Problem Relation Age of Onset  . Diabetes Sister        Twin - AIDS     Social History   Tobacco Use  . Smoking status: Light Tobacco Smoker    Packs/day: 0.25    Types: Cigarettes  . Smokeless tobacco: Never Used  Substance Use Topics  . Alcohol use: Not Currently    Alcohol/week: 40.0 standard drinks    Types: 40 Cans of beer per week    Comment: 2 16oz cans of beer a day  . Drug use: Not Currently    Types: Marijuana, Cocaine    Comment: Last smoked marijuana yesterday 10/18/14    Home Medications Prior to Admission medications   Medication Sig Start Date  End Date Taking? Authorizing Provider  amLODipine (NORVASC) 5 MG tablet TAKE ONE TABLET BY MOUTH ONCE DAILY. Patient taking differently: Take 5 mg by mouth daily. 04/15/20   [name], MD  blood glucose meter kit and supplies Dispense based on patient and insurance preference. Once daily testing dx E11.9. 09/20/18   [name], MD  calcitRIOL (ROCALTROL) 0.25 MCG capsule Take 0.25 mcg by mouth every Monday, Wednesday, and Friday. 02/29/20   [provider]  ergocalciferol (VITAMIN D2) 1.25 MG (50000 UT) capsule Take 1 capsule (50,000 Units total) by mouth once a week. One capsule once weekly Patient taking differently: Take 50,000 Units by mouth every Monday. One capsule once weekly 01/10/20    [name], MD  fluticasone [facility]) 50 MCG/ACT nasal spray Place 1 spray into both nostrils daily. 01/18/20 01/17/21  [name], MD  furosemide (LASIX) 20 MG tablet TAKE (1) TABLET BY MOUTH THREE TIMES WEEKLY AS NEEDED FOR LEG SWELIING 05/15/20   [name], MD  gabapentin (NEURONTIN) 100 MG capsule Take 1 capsule (100 mg total) by mouth 3 (three) times daily. 03/27/20   Mordecai Rasmussen, MD  glipiZIDE (GLUCOTROL XL) 10 MG 24 hr tablet Take 1 tablet (10 mg total) by mouth daily with breakfast. 05/09/20   [name], MD  guaiFENesin-dextromethorphan (ROBITUSSIN DM) 100-10 MG/5ML syrup Take 5 mLs by mouth every 6 (six) hours as needed for cough. 01/18/20   [name], MD  Ipratropium-Albuterol (COMBIVENT) 20-100 MCG/ACT AERS respimat Inhale 1 puff into the lungs every 6 (six) hours as needed for wheezing. 01/18/20   [name], MD  loratadine (CLARITIN) 10 MG tablet Take 1 tablet (10 mg total) by mouth daily. 01/18/20 01/17/21  [name], MD  losartan (COZAAR) 25 MG tablet Take 25 mg by mouth daily.    [provider]  losartan (COZAAR) 50 MG tablet Take 1 tablet (50 mg total) by mouth daily. 04/24/20   [name], MD  mirtazapine (REMERON) 15 MG tablet Take 1 tablet (15 mg total) by mouth at bedtime. 03/05/20   [name], MD  rosuvastatin (CRESTOR) 10 MG tablet Take 1 tablet (10 mg total) by mouth daily. 05/09/20   [name], MD  sodium bicarbonate 650 MG tablet Take 650 mg by mouth 2 (two) times daily. 02/29/20   [provider]    Allergies    Penicillins  Review of Systems   Review of Systems  All other systems reviewed and are negative.   Physical Exam Updated Vital Signs BP (!) 159/98   Pulse (!) 103   Temp 98 F (36.7 C) (Oral)   Resp (!) 22   LMP 06/23/2010   SpO2 97%   Physical Exam Vitals and nursing note reviewed.  Constitutional:      General: She is not in acute distress.     Appearance: She is well-developed.  HENT:     Head: Normocephalic and atraumatic.     Right Ear: External ear normal.     Left Ear: External ear normal.  Eyes:     General: No scleral icterus.       Right eye: No discharge.        Left eye: No discharge.     Conjunctiva/sclera: Conjunctivae normal.  Neck:     Trachea: No tracheal deviation.  Cardiovascular:     Rate and Rhythm: Normal rate and regular rhythm.  Pulmonary:     Effort: Pulmonary effort is normal. No  respiratory distress.     Breath sounds: Normal breath sounds. No stridor. No wheezing or rales.  Abdominal:     General: Bowel sounds are normal. There is no distension.     Palpations: Abdomen is soft. There is no mass.     Tenderness: There is no abdominal tenderness. There is no guarding or rebound.     Comments: Protuberant abdomen, distended, no tenderness, questionable ascites  Musculoskeletal:        General: No tenderness.     Cervical back: Neck supple.     Right lower leg: Edema present.     Left lower leg: Edema present.     Comments: Tense pitting edema bilateral lower extremities  Skin:    General: Skin is warm and dry.     Findings: No rash.  Neurological:     Mental Status: She is alert.     Cranial Nerves: No cranial nerve deficit (no facial droop, extraocular movements intact, no slurred speech).     Sensory: No sensory deficit.     Motor: No abnormal muscle tone or seizure activity.     Coordination: Coordination normal.     ED Results / Procedures / Treatments   Labs (all labs ordered are listed, but only abnormal results are displayed) Labs Reviewed  CBC - Abnormal; Notable for the following components:      Result Value   Hemoglobin 10.6 (*)    HCT 35.2 (*)    All other components within normal limits  COMPREHENSIVE METABOLIC PANEL - Abnormal; Notable for the following components:   Chloride 114 (*)    CO2 19 (*)    Glucose, Bld 247 (*)    BUN 27 (*)    Creatinine, Ser 2.43 (*)     Calcium 8.2 (*)    Total Protein 6.1 (*)    Albumin 2.7 (*)    GFR, Estimated 23 (*)    All other components within normal limits  BRAIN NATRIURETIC PEPTIDE - Abnormal; Notable for the following components:   B Natriuretic Peptide 1,241.0 (*)    All other components within normal limits    EKG EKG Interpretation  Date/Time:  Thursday June 06 2020 14:17:40 EDT Ventricular Rate:  105 PR Interval:    QRS Duration: 136 QT Interval:  337 QTC Calculation: 446 R Axis:   75 Text Interpretation: Sinus tachycardia Probable left ventricular hypertrophy No old tracing to compare Confirmed by Dorie Rank [phone]) on 06/06/2020 2:30:21 PM   Radiology DG Chest Port 1 View  Result Date: 06/06/2020 CLINICAL DATA:  Shortness of breath with lower extremity edema EXAM: PORTABLE CHEST 1 VIEW COMPARISON:  January 17, 2020 FINDINGS: There is ill-defined airspace opacity in the right base. The lungs elsewhere are clear. Heart is enlarged with pulmonary vascularity normal. No adenopathy. There is aortic atherosclerosis. There is lower thoracic dextroscoliosis. No bone lesions. IMPRESSION: Ill-defined opacity right base concerning for pneumonia. Lungs elsewhere clear. Cardiomegaly present. No adenopathy. Aortic Atherosclerosis (ICD10-I70.0). Electronically Signed   By: Lowella Grip III M.D.   On: 06/06/2020 15:18   [place] ASCITES (ABDOMEN LIMITED)  Result Date: 06/06/2020 CLINICAL DATA:  Edema question ascites EXAM: LIMITED ABDOMEN ULTRASOUND FOR ASCITES TECHNIQUE: Limited ultrasound survey for ascites was performed in all four abdominal quadrants. COMPARISON:  Chest radiograph 06/06/2020 FINDINGS: No significant ascites identified. Small pleural effusions are noted at the lung bases bilaterally. Multiple shadowing calculi within gallbladder. IMPRESSION: No evidence of ascites. Small pleural effusions. Cholelithiasis. Electronically Signed   By: Elta [place]  Thornton Papas M.D.   On: 06/06/2020 15:41    Procedures Procedures    Medications Ordered in ED Medications - No data to display  ED Course  I have reviewed the triage vital signs and the nursing notes.  Pertinent labs & imaging results that were available during my care of the patient were reviewed by me and considered in my medical decision making (see chart for details).    MDM Rules/Calculators/A&P                          Patient presented to the ED with several months of shortness of breath.  Patient also has notable edema on exam.  I am concerned also the possibility of ascites based on physical exam.  X-ray suggests questionable pneumonia although I do not think her presentation is consistent with that.  There is no evidence of pulmonary edema.  BNP is pending.  Patient does have low protein levels and kidney disease.  It certainly possible this could be contributing to her edema.  Ultrasound is pending.  Care will be turned over to Dr. Sabra Heck.  Anticipate that patient may be able to go home with diuretic treatment and continued outpatient follow-up with her primary doctor and nephrology. Final Clinical Impression(s) / ED Diagnoses Final diagnoses:  Peripheral edema  Chronic kidney disease, unspecified CKD stage    Rx / DC Orders ED Discharge Orders    None       Dorie Rank, MD 06/06/20 1545

## 2020-06-06 NOTE — ED Provider Notes (Signed)
I have personally examined this patient at change of shift, the patient is 60 years old, she has a history of multiple medical problems but most importantly she has a history of hypertension, she has a history of chronic kidney disease and hepatitis C.  She had seen her primary care physician through a virtual visit recently because of leg pain, she was referred to orthopedics who noticed that she was edematous, she was sent to the emergency department because of what appeared to be fluid overload and shortness of breath.  That was a couple of weeks ago, she never actually made it in until today when she was finally able to make it.  On my exam the patient has peripheral edema which is significant and actually extends up to the proximal thighs and an anasarca type pattern.  She is tachypneic, her lungs are overall clear but she is tachycardic to 110 bpm.  She has minimal JVD, she has no significant pulmonary edema on her chest x-ray but her BNP is well over 1200 suggestive of congestive heart failure.  The patient's EKG has nonspecific T waves, sinus tachycardia  Labs reveal that the patient has a potassium of 4.2, sodium of 140, BUN of 27 and a creatinine of 2.43, albumin is 2.7 and protein is 6.1, both low.  CBC is unremarkable.  The patient has been given Lasix intravenously, she has not diuresed significantly and on repeat exam she is still tachypneic tachycardic and hypertensive at 179/124.  The patient will be given Nitropaste, a dose of her antihypertensives and will be admitted to the hospital.  Discussed case with Dr. Landis Gandy, who will admit  The patient appears to be critically ill with acute congestive heart failure secondary to severe hypertensive blood pressure.   EKG Interpretation  Date/Time:  Thursday June 06 2020 14:17:40 EDT Ventricular Rate:  105 PR Interval:    QRS Duration: 136 QT Interval:  337 QTC Calculation: 446 R Axis:   75 Text Interpretation: Sinus tachycardia  Probable left ventricular hypertrophy No old tracing to compare Confirmed by Dorie Rank [phone]) on 06/06/2020 2:30:21 PM        .Critical Care Performed by: Noemi Chapel, MD Authorized by: Noemi Chapel, MD   Critical care provider statement:    Critical care time (minutes):  35   Critical care time was exclusive of:  Separately billable procedures and treating other patients and teaching time   Critical care was necessary to treat or prevent imminent or life-threatening deterioration of the following conditions:  Cardiac failure   Critical care was time spent personally by me on the following activities:  Blood draw for specimens, development of treatment plan with patient or surrogate, discussions with consultants, evaluation of patient's response to treatment, examination of patient, obtaining history from patient or surrogate, ordering and performing treatments and interventions, ordering and review of laboratory studies, ordering and review of radiographic studies, pulse oximetry, re-evaluation of patient's condition and review of old charts Comments:         Final diagnoses:  Peripheral edema  Chronic kidney disease, unspecified CKD stage  Acute congestive heart failure, unspecified heart failure type ([place])  Severe hypertension      Noemi Chapel, MD 06/06/20 1732

## 2020-06-06 NOTE — Progress Notes (Signed)
Pt instructed on the use of IS and attempted 500 5 times pt refused to do any more will need encouragement from nurse. Spo2 95% on room air

## 2020-06-06 NOTE — H&P (Signed)
[facility] H&P   Patient Demographics:    Latoya Walsh, is a 60 y.o. female  MRN: [id number]   DOB - [birth year]  Admit Date - 06/06/2020  Outpatient Primary MD for the patient is [name], MD  Referring MD/NP/PA: Dr Sabra Heck  Patient coming from: Home  Chief Complaint  Patient presents with  . Shortness of Breath      HPI:    Latoya Walsh  is a 60 y.o. female, with past medical history of anxiety, depression, chronic hep C, treated, diabetes mellitus type 2, hypertension, nicotine dependence, CKd stage IIIb, patient presents to ED secondary to complaints of shortness of breath and chest pain, and fluid retention, patient reports over the last 20-month she has been having progressive dyspnea, worsening lower extremity edema, and 30 to 40 pounds of fluid weight gain, reports she has been compliant with her low-salt diet, she denies any chest pain, fever, chills, mopped assist, no fever or chills, report she is not on any diuretics currently, she is still smoking. - in ED she was noted with +3 lower extremity edema, elevated BNP at 1241, baseline hemoglobin around 10.6, she is with elevated creatinine at 2.43, which is around her baseline, she had hyperglycemia at 247, she received 40 mg of IV Lasix, [facility] hospitalist consulted to admit.  Review of systems:    In addition to the HPI above, No Fever-chills, No Headache, No changes with Vision or hearing, No problems swallowing food or Liquids, No Chest pain, Cough, she does report dyspnea, and lower extremity edema No Abdominal pain, No Nausea or Vommitting, Bowel movements are regular, No Blood in stool or Urine, No dysuria, No new skin rashes or bruises, No new joints pains-aches,  No new weakness, tingling, numbness in any extremity, Does report weight gain and fluid retention No polyuria, polydypsia or polyphagia, No  significant Mental Stressors.  A full 10 point Review of Systems was done, except as stated above, all other Review of Systems were negative.   With Past History of the following :    Past Medical History:  Diagnosis Date  . Allergic rhinitis   . Anxiety   . Chronic back pain   . Chronic hepatitis C without hepatic coma ([facility] [place]   . Depression   . Diabetes mellitus   . Hepatitis C   . Hypertension   . Noncompliance   . Poor appetite 07/17/2014  . Substance abuse ([place]    HX of drug use and alcohol use      Past Surgical History:  Procedure Laterality Date  . APPENDECTOMY  2004      Social History:     Social History   Tobacco Use  . Smoking status: Light Tobacco Smoker    Packs/day: 0.25    Types: Cigarettes  . Smokeless tobacco: Never Used  Substance Use Topics  . Alcohol use: Not Currently  Alcohol/week: 40.0 standard drinks    Types: 40 Cans of beer per week    Comment: 2 16oz cans of beer a day       Family History :     Family History  Problem Relation Age of Onset  . Diabetes Sister        Twin - AIDS       Home Medications:   Prior to Admission medications   Medication Sig Start Date End Date Taking? Authorizing Provider  amLODipine (NORVASC) 5 MG tablet TAKE ONE TABLET BY MOUTH ONCE DAILY. Patient taking differently: Take 5 mg by mouth daily. 04/15/20  Yes [name], MD  blood glucose meter kit and supplies Dispense based on patient and insurance preference. Once daily testing dx E11.9. 09/20/18  Yes [name], MD  calcitRIOL (ROCALTROL) 0.25 MCG capsule Take 0.25 mcg by mouth every Monday, Wednesday, and Friday. 02/29/20  Yes [provider]  ergocalciferol (VITAMIN D2) 1.25 MG (50000 UT) capsule Take 1 capsule (50,000 Units total) by mouth once a week. One capsule once weekly Patient taking differently: Take 50,000 Units by mouth every Monday. One capsule once weekly 01/10/20  Yes [name], MD   fluticasone [facility]) 50 MCG/ACT nasal spray Place 1 spray into both nostrils daily. 01/18/20 01/17/21 Yes [name], MD  furosemide (LASIX) 20 MG tablet TAKE (1) TABLET BY MOUTH THREE TIMES WEEKLY AS NEEDED FOR LEG SWELIING 05/15/20  Yes [name], MD  gabapentin (NEURONTIN) 100 MG capsule Take 1 capsule (100 mg total) by mouth 3 (three) times daily. 03/27/20  Yes Mordecai Rasmussen, MD  glipiZIDE (GLUCOTROL XL) 10 MG 24 hr tablet Take 1 tablet (10 mg total) by mouth daily with breakfast. 05/09/20  Yes [name], MD  guaiFENesin-dextromethorphan (ROBITUSSIN DM) 100-10 MG/5ML syrup Take 5 mLs by mouth every 6 (six) hours as needed for cough. 01/18/20  Yes [name], MD  Ipratropium-Albuterol (COMBIVENT) 20-100 MCG/ACT AERS respimat Inhale 1 puff into the lungs every 6 (six) hours as needed for wheezing. 01/18/20  Yes [name], MD  loratadine (CLARITIN) 10 MG tablet Take 1 tablet (10 mg total) by mouth daily. 01/18/20 01/17/21 Yes [name], MD  losartan (COZAAR) 25 MG tablet Take 25 mg by mouth daily. Takes with 50 mg. (Total 75 mg daily)   Yes [provider]  losartan (COZAAR) 50 MG tablet Take 1 tablet (50 mg total) by mouth daily. Patient taking differently: Take 50 mg by mouth daily. Takes with 25 mg. (Total 75 mg daily) 04/24/20  Yes [name], MD  mirtazapine (REMERON) 15 MG tablet Take 1 tablet (15 mg total) by mouth at bedtime. 03/05/20  Yes [name], MD  rosuvastatin (CRESTOR) 10 MG tablet Take 1 tablet (10 mg total) by mouth daily. 05/09/20  Yes [name], MD  sodium bicarbonate 650 MG tablet Take 650 mg by mouth 2 (two) times daily. 02/29/20  Yes [provider]     Allergies:     Allergies  Allergen Reactions  . Penicillins Itching and Rash     Physical Exam:   Vitals  Blood pressure (!) 179/115, pulse 99, temperature 98.2 F (36.8 C), temperature source Oral, resp. rate 18, height _0  (1.702  m), weight 78 kg, last menstrual period 06/23/2010, SpO2 95 %.   1. General developed female, laying in bed, no apparent distress  2. Normal affect and insight, Not Suicidal or Homicidal, Awake Alert, Oriented X 3.  3. No F.N deficits, ALL C.Nerves Intact, Strength 5/5 all 4 extremities, Sensation intact all 4 extremities, Plantars down going.  4. Ears and Eyes appear Normal, Conjunctivae clear, PERRLA. Moist Oral Mucosa.  5. Supple Neck, No JVD, No cervical lymphadenopathy appriciated, No Carotid Bruits.  +3 edema up to the thigh  6. Symmetrical Chest wall movement, Good air movement bilaterally, bibasilar crackles  7. RRR, No Gallops, Rubs or Murmurs, No Parasternal Heave.  8. Positive Bowel Sounds, Abdomen Soft, No tenderness, No organomegaly appriciated,No rebound -guarding or rigidity.  9.  No Cyanosis, Normal Skin Turgor, No Skin Rash or Bruise.  10. Good muscle tone,  joints appear normal , no effusions, Normal ROM.  11. No Palpable Lymph Nodes in Neck or Axillae    Data Review:    CBC Recent Labs  Lab 06/06/20 1447  WBC 6.9  HGB 10.6*  HCT 35.2*  PLT 206  MCV 91.0  MCH 27.4  MCHC 30.1  RDW 14.5   ------------------------------------------------------------------------------------------------------------------  Chemistries  Recent Labs  Lab 06/06/20 1447  NA 140  K 4.2  CL 114*  CO2 19*  GLUCOSE 247*  BUN 27*  CREATININE 2.43*  CALCIUM 8.2*  AST 26  ALT 20  ALKPHOS 87  BILITOT 0.5   ------------------------------------------------------------------------------------------------------------------ estimated creatinine clearance is 27.5 mL/min (A) (by C-G formula based on SCr of 2.43 mg/dL (H)). ------------------------------------------------------------------------------------------------------------------ No results for input(s): TSH, T4TOTAL, T3FREE, THYROIDAB in the last 72 hours.  Invalid input(s): FREET3  Coagulation profile No results  for input(s): INR, PROTIME in the last 168 hours. ------------------------------------------------------------------------------------------------------------------- No results for input(s): DDIMER in the last 72 hours. -------------------------------------------------------------------------------------------------------------------  Cardiac Enzymes No results for input(s): CKMB, TROPONINI, MYOGLOBIN in the last 168 hours.  Invalid input(s): CK ------------------------------------------------------------------------------------------------------------------    Component Value Date/Time   BNP 1,241.0 (H) 06/06/2020 1447     ---------------------------------------------------------------------------------------------------------------  Urinalysis    Component Value Date/Time   COLORURINE YELLOW 01/17/2020 1648   APPEARANCEUR CLEAR 01/17/2020 1648   LABSPEC 1.011 01/17/2020 1648   PHURINE 6.0 01/17/2020 1648   GLUCOSEU NEGATIVE 01/17/2020 1648   HGBUR SMALL (A) 01/17/2020 1648   BILIRUBINUR NEGATIVE 01/17/2020 1648   KETONESUR NEGATIVE 01/17/2020 1648   PROTEINUR >=300 (A) 01/17/2020 1648   UROBILINOGEN 0.2 10/19/2014 1023   NITRITE NEGATIVE 01/17/2020 1648   LEUKOCYTESUR SMALL (A) 01/17/2020 1648    ----------------------------------------------------------------------------------------------------------------   Imaging Results:    DG Chest Port 1 View  Result Date: 06/06/2020 CLINICAL DATA:  Shortness of breath with lower extremity edema EXAM: PORTABLE CHEST 1 VIEW COMPARISON:  January 17, 2020 FINDINGS: There is ill-defined airspace opacity in the right base. The lungs elsewhere are clear. Heart is enlarged with pulmonary vascularity normal. No adenopathy. There is aortic atherosclerosis. There is lower thoracic dextroscoliosis. No bone lesions. IMPRESSION: Ill-defined opacity right base concerning for pneumonia. Lungs elsewhere clear. Cardiomegaly present. No adenopathy.  Aortic Atherosclerosis (ICD10-I70.0). Electronically Signed   By: Lowella Grip III M.D.   On: 06/06/2020 15:18   [place] ASCITES (ABDOMEN LIMITED)  Result Date: 06/06/2020 CLINICAL DATA:  Edema question ascites EXAM: LIMITED ABDOMEN ULTRASOUND FOR ASCITES TECHNIQUE: Limited ultrasound survey for ascites was performed in all four abdominal quadrants. COMPARISON:  Chest radiograph 06/06/2020 FINDINGS: No significant ascites identified. Small pleural effusions are noted at the lung bases bilaterally. Multiple shadowing calculi within gallbladder. IMPRESSION: No evidence of ascites. Small pleural effusions. Cholelithiasis. Electronically Signed   By: Lavonia Dana M.D.   On: 06/06/2020 15:41    My personal  review of EKG: Rhythm NSR, Rate  105 /min, QTc 446 , no Acute ST changes   Assessment & Plan:    Active Problems:   Controlled type 2 diabetes with neuropathy (HCC)   Anxiety and depression   Chronic hepatitis C without hepatic coma (HCC)   Anemia   Acute CHF (congestive heart failure) (HCC)  Acute CHF -New diagnosis, will obtain 2D echo to specify systolic versus diastolic. -Check TSH. -Admit under CHF pathway, strict ins and outs, daily weight, IV Lasix 40 mg twice daily, monitor BMP closely   Essential hypertension -Blood pressure uncontrolled, I will increase her Norvasc from 5 to 10 mg oral daily, will add as needed IV hydralazine, I will hold losartan for now awaiting repeat renal function after IV diuresis.  Type 2 diabetes mellitus -Resume glipizide, will add insulin sliding scale, will check A1c.  hyperlipidemia -Continue statins  depression -Continue home medication  Hepatitis C -Reported was treated treated  Tobacco abuse -She was counseled -Reports she is currently abstinent from alcohol use.    DVT Prophylaxis Heparin   AM Labs Ordered, also please review Full Orders  Family Communication: Admission, patients condition and plan of care including tests  being ordered have been discussed with the patient  who indicate understanding and agree with the plan and Code Status.  Code Status Full  Likely DC to  home  Condition GUARDED    Consults called: cards requested in Epic    Admission status: inpatient    Time spent in minutes : 60 minutes   Phillips Climes M.D on 06/06/2020 at 9:01 PM   [facility] - Office  [phone]

## 2020-06-07 ENCOUNTER — Inpatient Hospital Stay (HOSPITAL_COMMUNITY): Payer: Medicaid Other

## 2020-06-07 DIAGNOSIS — R0600 Dyspnea, unspecified: Secondary | ICD-10-CM

## 2020-06-07 DIAGNOSIS — N1832 Chronic kidney disease, stage 3b: Secondary | ICD-10-CM

## 2020-06-07 DIAGNOSIS — D631 Anemia in chronic kidney disease: Secondary | ICD-10-CM

## 2020-06-07 DIAGNOSIS — I10 Essential (primary) hypertension: Secondary | ICD-10-CM

## 2020-06-07 DIAGNOSIS — I340 Nonrheumatic mitral (valve) insufficiency: Secondary | ICD-10-CM

## 2020-06-07 DIAGNOSIS — F32A Depression, unspecified: Secondary | ICD-10-CM

## 2020-06-07 DIAGNOSIS — F419 Anxiety disorder, unspecified: Secondary | ICD-10-CM

## 2020-06-07 DIAGNOSIS — E1121 Type 2 diabetes mellitus with diabetic nephropathy: Secondary | ICD-10-CM

## 2020-06-07 DIAGNOSIS — I361 Nonrheumatic tricuspid (valve) insufficiency: Secondary | ICD-10-CM

## 2020-06-07 LAB — CBC WITH DIFFERENTIAL/PLATELET
Abs Immature Granulocytes: 0.02 10*3/uL (ref 0.00–0.07)
Basophils Absolute: 0.1 10*3/uL (ref 0.0–0.1)
Basophils Relative: 1 %
Eosinophils Absolute: 0.1 10*3/uL (ref 0.0–0.5)
Eosinophils Relative: 2 %
HCT: 32 % — ABNORMAL LOW (ref 36.0–46.0)
Hemoglobin: 9.7 g/dL — ABNORMAL LOW (ref 12.0–15.0)
Immature Granulocytes: 0 %
Lymphocytes Relative: 35 %
Lymphs Abs: 2.3 10*3/uL (ref 0.7–4.0)
MCH: 27.4 pg (ref 26.0–34.0)
MCHC: 30.3 g/dL (ref 30.0–36.0)
MCV: 90.4 fL (ref 80.0–100.0)
Monocytes Absolute: 0.4 10*3/uL (ref 0.1–1.0)
Monocytes Relative: 6 %
Neutro Abs: 3.8 10*3/uL (ref 1.7–7.7)
Neutrophils Relative %: 56 %
Platelets: 210 10*3/uL (ref 150–400)
RBC: 3.54 MIL/uL — ABNORMAL LOW (ref 3.87–5.11)
RDW: 14.5 % (ref 11.5–15.5)
WBC: 6.7 10*3/uL (ref 4.0–10.5)
nRBC: 0 % (ref 0.0–0.2)

## 2020-06-07 LAB — BASIC METABOLIC PANEL
Anion gap: 8 (ref 5–15)
BUN: 26 mg/dL — ABNORMAL HIGH (ref 6–20)
CO2: 21 mmol/L — ABNORMAL LOW (ref 22–32)
Calcium: 8.1 mg/dL — ABNORMAL LOW (ref 8.9–10.3)
Chloride: 112 mmol/L — ABNORMAL HIGH (ref 98–111)
Creatinine, Ser: 2.45 mg/dL — ABNORMAL HIGH (ref 0.44–1.00)
GFR, Estimated: 22 mL/min — ABNORMAL LOW (ref >60)
Glucose, Bld: 238 mg/dL — ABNORMAL HIGH (ref 70–99)
Potassium: 4 mmol/L (ref 3.5–5.1)
Sodium: 141 mmol/L (ref 135–145)

## 2020-06-07 LAB — GLUCOSE, CAPILLARY
Glucose-Capillary: 144 mg/dL — ABNORMAL HIGH (ref 70–99)
Glucose-Capillary: 84 mg/dL (ref 70–99)
Glucose-Capillary: 104 mg/dL — ABNORMAL HIGH (ref 70–99)
Glucose-Capillary: 170 mg/dL — ABNORMAL HIGH (ref 70–99)

## 2020-06-07 LAB — TSH: TSH: 1.673 u[IU]/mL (ref 0.350–4.500)

## 2020-06-07 LAB — HEMOGLOBIN A1C
Hgb A1c MFr Bld: 7.7 % — ABNORMAL HIGH (ref 4.8–5.6)
Mean Plasma Glucose: 174.29 mg/dL

## 2020-06-07 LAB — ECHOCARDIOGRAM COMPLETE
Area-P 1/2: 4.15 cm2
Calc EF: 40.1 %
Height: 67 in
S' Lateral: 3.8 cm
Single Plane A2C EF: 49.8 %
Single Plane A4C EF: 39.8 %
Weight: 2751.34 oz

## 2020-06-07 MED ORDER — LIVING BETTER WITH HEART FAILURE BOOK: Freq: Once | Status: AC

## 2020-06-07 MED ORDER — HYDRALAZINE HCL 20 MG/ML IJ SOLN: 10.0000 mg | Freq: Three times a day (TID) | INTRAMUSCULAR | Status: DC | PRN

## 2020-06-07 NOTE — Progress Notes (Signed)
PROGRESS NOTE    Latoya Walsh  [id number] DOB: [birth year] DOA: 06/06/2020 PCP: [name], MD   Chief Complaint  Patient presents with  . Shortness of Breath / Concern for acute CHF.    Brief admission narrative: As per H&P written by Dr. Waldron Labs on 06/06/2020  Latoya Walsh  is a 60 y.o. female, with past medical history of anxiety, depression, chronic hep C, treated, diabetes mellitus type 2, hypertension, nicotine dependence, CKd stage IIIb, patient presents to ED secondary to complaints of shortness of breath and chest pain, and fluid retention, patient reports over the last 55-month she has been having progressive dyspnea, worsening lower extremity edema, and 30 to 40 pounds of fluid weight gain, reports she has been compliant with her low-salt diet, she denies any chest pain, fever, chills, mopped assist, no fever or chills, report she is not on any diuretics currently, she is still smoking. - in ED she was noted with +3 lower extremity edema, elevated BNP at 1241, baseline hemoglobin around 10.6, she is with elevated creatinine at 2.43, which is around her baseline, she had hyperglycemia at 247, she received 40 mg of IV Lasix, [facility] hospitalist consulted to admit.  Assessment & Plan: 1-acute congestive heart failure -Appears to be diastolic in nature; echo pending to specified diagnosis. -Still complaining of orthopnea, requiring oxygen supplementation and with signs of fluid overload on examination.   -Continue daily weights, low-sodium diet, strict I's and O's and IV diuresis.  - -wean off oxygen supplementation and follow clinical response.  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 -Continue glipizide  3-Anxiety and depression -Mood stable -No suicidal ideation hallucination currently. -Continue tramadol.  4-history of chronic hepatitis C  without hepatic coma ([place] -Continue outpatient follow-up gastroenterology service.  5-Anemia of chronic disease -No signs of overt bleeding -Continue to follow hemoglobin trend.  6-chronic kidney disease a stage IIIb -Stable electrolytes at this time -Continue to closely follow renal function trend with active diuresis. -Continue sodium bicarb twice a day.  7-hyperlipidemia -Continue statins.  8-essential hypertension -fair control appreciated -Continue amlodipine and Lasix currently  DVT prophylaxis: Heparin Code Status: Full code Family Communication: No family at bedside. Disposition:   Status is: Inpatient  Dispo: The patient is from: Home              Anticipated d/c is to: Home              Patient currently no medically stable for discharge; still requiring oxygen supplementation, complaining of orthopnea and short winded sensation.  Signs of fluid overload appreciated on examination.  Continue IV diuresis and follow 2D echo.   Difficult to place patient no    Consultants:   None   Procedures:  See below for x-ray reports 2D echo: Pending   Antimicrobials:  None   Subjective: Patient denies chest pain, palpitations, nausea or vomiting.  Still complaining of short winded sensation with activity; positive orthopnea and is using 1-2 L nasal cannula supplementation.  No fever  Objective: Vitals:   06/07/20 0000 06/07/20 0210 06/07/20 0540 06/07/20 1353  BP: (!) 157/87 (!) 185/88 (!) 171/92 (!) 165/90  Pulse:  (!) 103 92 99  Resp: '18 18 18 18  '$ Temp:  98.5 F (36.9 C)  98.1 F (36.7 C)  TempSrc:  Oral Oral Oral  SpO2:  99% 99% 100%  Weight:      Height:  Intake/Output Summary (Last 24 hours) at 06/07/2020 1641 Last data filed at 06/07/2020 0948 Gross per 24 hour  Intake 63 ml  Output 1500 ml  Net -1437 ml   Filed Weights   06/06/20 1957  Weight: 78 kg    Examination: General exam: No fever, no chest pain, no nausea or vomiting.   Reporting orthopnea and short winded sensation with activity.  Using 1-2 L nasal cannula supplementation (new for her).  Physical examination With findings of fluid overload (increase abdominal girth and lower extremity swelling). Respiratory system: Fine crackles appreciated at the bases; no using accessory muscle.  Nasal cannula supplementation in place.  No wheezing. Cardiovascular system: No rubs, no gallops, no murmurs appreciated.  Mild JVD on exam seen. Gastrointestinal system: Abdomen is soft and nontender.  Increased abdominal girth appreciated on examination.  No organomegaly or masses felt. Normal bowel sounds heard. Central nervous system: Alert and oriented. No focal neurological deficits. Extremities: No cyanosis or clubbing.  2+ edema appreciated bilaterally. Skin: No petechiae. Psychiatry: Judgement and insight appear normal. Mood & affect appropriate.     Data Reviewed: I have personally reviewed following labs and imaging studies  CBC: Recent Labs  Lab 06/06/20 1447 06/07/20 0507  WBC 6.9 6.7  NEUTROABS  --  3.8  HGB 10.6* 9.7*  HCT 35.2* 32.0*  MCV 91.0 90.4  PLT 206 [id number]    Basic Metabolic Panel: Recent Labs  Lab 06/06/20 1447 06/07/20 0507  NA 140 141  K 4.2 4.0  CL 114* 112*  CO2 19* 21*  GLUCOSE 247* 238*  BUN 27* 26*  CREATININE 2.43* 2.45*  CALCIUM 8.2* 8.1*    GFR: Estimated Creatinine Clearance: 27.3 mL/min (A) (by C-G formula based on SCr of 2.45 mg/dL (H)).  Liver Function Tests: Recent Labs  Lab 06/06/20 1447  AST 26  ALT 20  ALKPHOS 87  BILITOT 0.5  PROT 6.1*  ALBUMIN 2.7*    CBG: Recent Labs  Lab 06/06/20 2148 06/07/20 0745 06/07/20 1149 06/07/20 1614  GLUCAP 198* 144* 84 104*   Radiology Studies: DG Chest Port 1 View  Result Date: 06/06/2020 CLINICAL DATA:  Shortness of breath with lower extremity edema EXAM: PORTABLE CHEST 1 VIEW COMPARISON:  January 17, 2020 FINDINGS: There is ill-defined airspace opacity in the  right base. The lungs elsewhere are clear. Heart is enlarged with pulmonary vascularity normal. No adenopathy. There is aortic atherosclerosis. There is lower thoracic dextroscoliosis. No bone lesions. IMPRESSION: Ill-defined opacity right base concerning for pneumonia. Lungs elsewhere clear. Cardiomegaly present. No adenopathy. Aortic Atherosclerosis (ICD10-I70.0). Electronically Signed   By: Lowella Grip III M.D.   On: 06/06/2020 15:18   [place] ASCITES (ABDOMEN LIMITED)  Result Date: 06/06/2020 CLINICAL DATA:  Edema question ascites EXAM: LIMITED ABDOMEN ULTRASOUND FOR ASCITES TECHNIQUE: Limited ultrasound survey for ascites was performed in all four abdominal quadrants. COMPARISON:  Chest radiograph 06/06/2020 FINDINGS: No significant ascites identified. Small pleural effusions are noted at the lung bases bilaterally. Multiple shadowing calculi within gallbladder. IMPRESSION: No evidence of ascites. Small pleural effusions. Cholelithiasis. Electronically Signed   By: Lavonia Dana M.D.   On: 06/06/2020 15:41   Scheduled Meds: . amLODipine  10 mg Oral Daily  . furosemide  40 mg Intravenous BID  . glipiZIDE  10 mg Oral Q breakfast  . heparin  5,000 Units Subcutaneous Q8H  . insulin aspart  0-15 Units Subcutaneous TID WC  . insulin aspart  0-5 Units Subcutaneous QHS  . mirtazapine  15 mg Oral  QHS  . rosuvastatin  10 mg Oral Daily  . sodium bicarbonate  650 mg Oral BID  . sodium chloride flush  3 mL Intravenous Q12H   Continuous Infusions: . sodium chloride       LOS: 1 day    Time spent: 30 minutes   [name], MD [facility]   To contact the attending provider between 7A-7P or the covering provider during after hours 7P-7A, please log into the web site [email or web address] and access using universal [place] password for that web site. If you do not have the password, please call the hospital operator.  06/07/2020, 4:41 PM

## 2020-06-07 NOTE — Plan of Care (Signed)
  Problem: Health Behavior/Discharge Planning: Goal: Ability to manage health-related needs will improve Outcome: Progressing   Problem: Education: Goal: Ability to demonstrate management of disease process will improve Outcome: Progressing Goal: Ability to verbalize understanding of medication therapies will improve Outcome: Progressing Goal: Individualized Educational Video(s) Outcome: Progressing   Problem: Activity: Goal: Capacity to carry out activities will improve Outcome: Progressing   Problem: Cardiac: Goal: Ability to achieve and maintain adequate cardiopulmonary perfusion will improve Outcome: Progressing   

## 2020-06-07 NOTE — Progress Notes (Signed)
  Echocardiogram 2D Echocardiogram has been performed.  Latoya Walsh 06/07/2020, 1:27 PM

## 2020-06-08 DIAGNOSIS — I5021 Acute systolic (congestive) heart failure: Secondary | ICD-10-CM

## 2020-06-08 LAB — BASIC METABOLIC PANEL
Anion gap: 9 (ref 5–15)
BUN: 30 mg/dL — ABNORMAL HIGH (ref 6–20)
CO2: 21 mmol/L — ABNORMAL LOW (ref 22–32)
Calcium: 8 mg/dL — ABNORMAL LOW (ref 8.9–10.3)
Chloride: 109 mmol/L (ref 98–111)
Creatinine, Ser: 2.54 mg/dL — ABNORMAL HIGH (ref 0.44–1.00)
GFR, Estimated: 21 mL/min — ABNORMAL LOW (ref >60)
Glucose, Bld: 170 mg/dL — ABNORMAL HIGH (ref 70–99)
Potassium: 4.2 mmol/L (ref 3.5–5.1)
Sodium: 139 mmol/L (ref 135–145)

## 2020-06-08 LAB — GLUCOSE, CAPILLARY
Glucose-Capillary: 130 mg/dL — ABNORMAL HIGH (ref 70–99)
Glucose-Capillary: 169 mg/dL — ABNORMAL HIGH (ref 70–99)
Glucose-Capillary: 78 mg/dL (ref 70–99)
Glucose-Capillary: 212 mg/dL — ABNORMAL HIGH (ref 70–99)

## 2020-06-08 MED ORDER — ISOSORBIDE MONONITRATE ER 60 MG PO TB24
30.0000 mg | ORAL_TABLET | Freq: Every day | ORAL | Status: DC
  Administered 2020-06-08 – 2020-06-10 (×3): 30 mg via ORAL
  Filled 2020-06-08 (×3): qty 1

## 2020-06-08 MED ORDER — METOPROLOL TARTRATE 25 MG PO TABS
25.0000 mg | ORAL_TABLET | Freq: Two times a day (BID) | ORAL | Status: DC
  Administered 2020-06-08 – 2020-06-10 (×5): 25 mg via ORAL
  Filled 2020-06-08 (×5): qty 1

## 2020-06-08 MED ORDER — HYDRALAZINE HCL 25 MG PO TABS
50.0000 mg | ORAL_TABLET | Freq: Three times a day (TID) | ORAL | Status: DC
  Administered 2020-06-08 – 2020-06-10 (×8): 50 mg via ORAL
  Filled 2020-06-08 (×9): qty 2

## 2020-06-08 NOTE — Plan of Care (Signed)
  Problem: Health Behavior/Discharge Planning: Goal: Ability to manage health-related needs will improve Outcome: Progressing   Problem: Education: Goal: Ability to demonstrate management of disease process will improve Outcome: Progressing Goal: Ability to verbalize understanding of medication therapies will improve Outcome: Progressing Goal: Individualized Educational Video(s) Outcome: Progressing   Problem: Activity: Goal: Capacity to carry out activities will improve Outcome: Progressing   Problem: Cardiac: Goal: Ability to achieve and maintain adequate cardiopulmonary perfusion will improve Outcome: Progressing   

## 2020-06-08 NOTE — Plan of Care (Signed)
  Problem: Health Behavior/Discharge Planning: Goal: Ability to manage health-related needs will improve 06/08/2020 0744 by Adam Phenix, RN Outcome: Progressing 06/08/2020 0743 by Adam Phenix, RN Outcome: Progressing   Problem: Education: Goal: Ability to demonstrate management of disease process will improve 06/08/2020 0744 by Adam Phenix, RN Outcome: Progressing 06/08/2020 0743 by Adam Phenix, RN Outcome: Progressing Goal: Ability to verbalize understanding of medication therapies will improve 06/08/2020 0744 by Adam Phenix, RN Outcome: Progressing 06/08/2020 0743 by Adam Phenix, RN Outcome: Progressing Goal: Individualized Educational Video(s) 06/08/2020 0744 by Adam Phenix, RN Outcome: Progressing 06/08/2020 0743 by Adam Phenix, RN Outcome: Progressing   Problem: Activity: Goal: Capacity to carry out activities will improve 06/08/2020 0744 by Adam Phenix, RN Outcome: Progressing 06/08/2020 0743 by Adam Phenix, RN Outcome: Progressing   Problem: Cardiac: Goal: Ability to achieve and maintain adequate cardiopulmonary perfusion will improve 06/08/2020 0744 by Adam Phenix, RN Outcome: Progressing 06/08/2020 0743 by Adam Phenix, RN Outcome: Progressing

## 2020-06-08 NOTE — Progress Notes (Signed)
PROGRESS NOTE    Latoya Walsh  [id number] DOB: Dec 30, 1960 DOA: 06/06/2020 PCP: [name], MD   Chief Complaint  Patient presents with  . Shortness of Breath / Concern for acute CHF.    Brief admission narrative: As per H&P written by Dr. Waldron Labs on 06/06/2020  Latoya Walsh  is a 60 y.o. female, with past medical history of anxiety, depression, chronic hep C, treated, diabetes mellitus type 2, hypertension, nicotine dependence, CKd stage IIIb, patient presents to ED secondary to complaints of shortness of breath and chest pain, and fluid retention, patient reports over the last 87-month she has been having progressive dyspnea, worsening lower extremity edema, and 30 to 40 pounds of fluid weight gain, reports she has been compliant with her low-salt diet, she denies any chest pain, fever, chills, mopped assist, no fever or chills, report she is not on any diuretics currently, she is still smoking. - in ED she was noted with +3 lower extremity edema, elevated BNP at 1241, baseline hemoglobin around 10.6, she is with elevated creatinine at 2.43, which is around her baseline, she had hyperglycemia at 247, she received 40 mg of IV Lasix, [facility] hospitalist consulted to admit.  Assessment & Plan: 1-acute congestive heart failure -Diastolic function indeterminant; ejection fraction 30-35%.  Global hypokinesis appreciated. -Still complaining of orthopnea, requiring oxygen supplementation and with signs of fluid overload on examination.   -Continue daily weights, low-sodium diet, strict I's and O's and IV diuresis.  - -wean off oxygen supplementation and follow clinical response. -Cardiology service has been consulted -No ARB or ACE inhibitors secondary to renal failure. -Started on beta-blocker, imdur and hydralazine.  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 -Continue glipizide  3-Anxiety and depression -Mood has remained stable. -No suicidal ideation hallucination currently. -Continue trazodone.  4-history of chronic hepatitis C without hepatic coma ([place] -Continue outpatient follow-up gastroenterology service.  5-Anemia of chronic disease -No signs of overt bleeding -Continue to follow hemoglobin trend.  6-chronic kidney disease a stage IIIb -Stable electrolytes at this time -Continue to closely follow renal function trend with active diuresis. -Continue sodium bicarb twice a day.  7-hyperlipidemia -Continue statins.  8-essential hypertension -fair control appreciated -Amlodipine has been discontinued in the setting of low ejection fraction -Patient started on Lopressor, Imdur, hydralazine and Lasix.  DVT prophylaxis: Heparin Code Status: Full code Family Communication: No family at bedside. Disposition:   Status is: Inpatient  Dispo: The patient is from: Home              Anticipated d/c is to: Home              Patient currently no medically stable for discharge; still requiring oxygen supplementation, complaining of orthopnea and short winded sensation.  Signs of fluid overload appreciated on examination.  Continue IV diuresis and follow cardiology recommendations.   Difficult to place patient no    Consultants:   Cardiology service.   Procedures:  See below for x-ray reports 2D echo: Systolic heart failure, ejection fraction 30 to 35%; global hypokinesis.  Indeterminate diastolic function   Antimicrobials:  None   Subjective: Using 2 L nasal cannula supplementation; reporting orthopnea.  No chest pain, no nausea, no vomiting.  Objective: Vitals:   06/08/20 0313 06/08/20 0546 06/08/20 1359 06/08/20 1405  BP:  (!) 164/95 (!) 141/86   Pulse:  93 87   Resp:  19 20   Temp:  98.2 F (36.8 C) 97.8 F (36.6 C)   TempSrc:   Axillary   SpO2:  100% 100% 100%  Weight: 78.1 kg     Height:         Intake/Output Summary (Last 24 hours) at 06/08/2020 1618 Last data filed at 06/08/2020 1500 Gross per 24 hour  Intake 240 ml  Output 3900 ml  Net -3660 ml   Filed Weights   06/06/20 1957 06/08/20 0313  Weight: 78 kg 78.1 kg    Examination: General exam: No fevers, no chest pain, no nausea, no vomiting, no abdominal pain.  Reports orthopnea and shortness of breath with activity.  Still with signs of fluid overload and patient examined.  Using 2 L nasal cannula supplementation during exam. Respiratory system: No wheezing, no using accessory muscles.  Bibasilar crackles appreciated on exam. Cardiovascular system: Rate controlled, no rubs, no gallops Gastrointestinal system: Abdomen is nondistended, soft and nontender. No organomegaly or masses felt. Normal bowel sounds heard. Central nervous system: Alert and oriented. No focal neurological deficits. Extremities: No cyanosis or clubbing; 1-2+ edema bilaterally. Skin: No rashes, lesions or ulcers Psychiatry: Judgement and insight appear normal. Mood & affect appropriate.     Data Reviewed: I have personally reviewed following labs and imaging studies  CBC: Recent Labs  Lab 06/06/20 1447 06/07/20 0507  WBC 6.9 6.7  NEUTROABS  --  3.8  HGB 10.6* 9.7*  HCT 35.2* 32.0*  MCV 91.0 90.4  PLT 206 [id number]    Basic Metabolic Panel: Recent Labs  Lab 06/06/20 1447 06/07/20 0507 06/08/20 0540  NA 140 141 139  K 4.2 4.0 4.2  CL 114* 112* 109  CO2 19* 21* 21*  GLUCOSE 247* 238* 170*  BUN 27* 26* 30*  CREATININE 2.43* 2.45* 2.54*  CALCIUM 8.2* 8.1* 8.0*    GFR: Estimated Creatinine Clearance: 26.3 mL/min (A) (by C-G formula based on SCr of 2.54 mg/dL (H)).  Liver Function Tests: Recent Labs  Lab 06/06/20 1447  AST 26  ALT 20  ALKPHOS 87  BILITOT 0.5  PROT 6.1*  ALBUMIN 2.7*    CBG: Recent Labs  Lab 06/07/20 1614 06/07/20 2211 06/08/20 0729 06/08/20 1107 06/08/20 1616  GLUCAP 104* 170* 130* 169* 78    Radiology Studies: ECHOCARDIOGRAM COMPLETE  Result Date: 06/07/2020    ECHOCARDIOGRAM REPORT   Patient Name:   Latoya Walsh Date of Exam: 06/07/2020 Medical Rec #:  [id number]        Height:       67.0 in Accession #:    [id number]       Weight:       172.0 lb Date of Birth:  28-Feb-1961       BSA:          1.896 m Patient Age:    20 years         BP:           171/92 mmHg Patient Gender: F                HR:           94 bpm. Exam Location:  Inpatient Procedure: 2D Echo, Color Doppler and Cardiac Doppler Indications:    Dyspnea R06.00  History:        Patient has no prior history of Echocardiogram examinations.                 Risk Factors:Hypertension, Diabetes and ETOH.  Sonographer:    Bernadene Person RDCS  Referring Phys: [place]  1. Left ventricular ejection fraction, by estimation, is 30 to 35%. The left ventricle has moderately decreased function. The left ventricle demonstrates global hypokinesis. Left ventricular diastolic parameters are indeterminate.  2. Right ventricular systolic function is low normal. The right ventricular size is normal. There is moderately elevated pulmonary artery systolic pressure. The estimated right ventricular systolic pressure is [id number] mmHg.  3. A small pericardial effusion is present. The pericardial effusion is posterior to the left ventricle.  4. The mitral valve is grossly normal. Mild mitral valve regurgitation.  5. Tricuspid valve regurgitation is moderate.  6. The aortic valve is tricuspid. Aortic valve regurgitation is not visualized.  7. The inferior vena cava is normal in size with <50% respiratory variability, suggesting right atrial pressure of 8 mmHg. FINDINGS  Left Ventricle: Left ventricular ejection fraction, by estimation, is 30 to 35%. The left ventricle has moderately decreased function. The left ventricle demonstrates global hypokinesis. The left ventricular internal cavity size was normal in size. There is no left  ventricular hypertrophy. Left ventricular diastolic parameters are indeterminate. Right Ventricle: The right ventricular size is normal. No increase in right ventricular wall thickness. Right ventricular systolic function is low normal. There is moderately elevated pulmonary artery systolic pressure. The tricuspid regurgitant velocity  is 3.16 m/s, and with an assumed right atrial pressure of 8 mmHg, the estimated right ventricular systolic pressure is [id number] mmHg. Left Atrium: Left atrial size was normal in size. Right Atrium: Right atrial size was normal in size. Pericardium: A small pericardial effusion is present. The pericardial effusion is posterior to the left ventricle. Mitral Valve: The mitral valve is grossly normal. Mild mitral valve regurgitation. Tricuspid Valve: The tricuspid valve is grossly normal. Tricuspid valve regurgitation is moderate. Aortic Valve: The aortic valve is tricuspid. There is mild aortic valve annular calcification. Aortic valve regurgitation is not visualized. Pulmonic Valve: The pulmonic valve was grossly normal. Pulmonic valve regurgitation is mild. Aorta: The aortic root is normal in size and structure. Venous: The inferior vena cava is normal in size with less than 50% respiratory variability, suggesting right atrial pressure of 8 mmHg. IAS/Shunts: No atrial level shunt detected by color flow Doppler.  LEFT VENTRICLE PLAX 2D LVIDd:         4.80 cm      Diastology LVIDs:         3.80 cm      LV e' medial:    8.12 cm/s LV PW:         1.00 cm      LV E/e' medial:  13.5 LV IVS:        0.90 cm      LV e' lateral:   9.81 cm/s LVOT diam:     1.80 cm      LV E/e' lateral: 11.2 LV SV:         43 LV SV Index:   23 LVOT Area:     2.54 cm  LV Volumes (MOD) LV vol d, MOD A2C: 129.0 ml LV vol d, MOD A4C: 102.0 ml LV vol s, MOD A2C: 64.8 ml LV vol s, MOD A4C: 61.4 ml LV SV MOD A2C:     64.2 ml LV SV MOD A4C:     102.0 ml LV SV MOD BP:      46.2 ml RIGHT VENTRICLE RV S prime:     9.41 cm/s  TAPSE (M-mode): 1.7 cm LEFT ATRIUM  Index       RIGHT ATRIUM           Index LA diam:        3.30 cm 1.74 cm/m  RA Area:     15.30 cm LA Vol (A2C):   51.5 ml 27.16 ml/m RA Volume:   39.60 ml  20.88 ml/m LA Vol (A4C):   57.6 ml 30.37 ml/m LA Biplane Vol: 55.0 ml 29.00 ml/m  AORTIC VALVE LVOT Vmax:   96.10 cm/s LVOT Vmean:  66.600 cm/s LVOT VTI:    0.170 m  AORTA Ao Root diam: 2.90 cm Ao Asc diam:  2.80 cm MITRAL VALVE                TRICUSPID VALVE MV Area (PHT): 4.15 cm     TR Peak grad:   39.9 mmHg MV Decel Time: 183 msec     TR Vmax:        316.00 cm/s MV E velocity: 110.00 cm/s MV A velocity: 51.40 cm/s   SHUNTS MV E/A ratio:  2.14         Systemic VTI:  0.17 m                             Systemic Diam: 1.80 cm Rozann Lesches MD Electronically signed by Rozann Lesches MD Signature Date/Time: 06/07/2020/5:03:40 PM    Final    Scheduled Meds: . furosemide  40 mg Intravenous BID  . glipiZIDE  10 mg Oral Q breakfast  . heparin  5,000 Units Subcutaneous Q8H  . hydrALAZINE  50 mg Oral Q8H  . insulin aspart  0-15 Units Subcutaneous TID WC  . insulin aspart  0-5 Units Subcutaneous QHS  . isosorbide mononitrate  30 mg Oral Daily  . metoprolol tartrate  25 mg Oral BID  . mirtazapine  15 mg Oral QHS  . rosuvastatin  10 mg Oral Daily  . sodium bicarbonate  650 mg Oral BID  . sodium chloride flush  3 mL Intravenous Q12H   Continuous Infusions: . sodium chloride       LOS: 2 days    Time spent: 30 minutes   [name], MD [facility]   To contact the attending provider between 7A-7P or the covering provider during after hours 7P-7A, please log into the web site [email or web address] and access using universal [place] password for that web site. If you do not have the password, please call the hospital operator.  06/08/2020, 4:18 PM

## 2020-06-09 LAB — BASIC METABOLIC PANEL
Anion gap: 9 (ref 5–15)
BUN: 31 mg/dL — ABNORMAL HIGH (ref 6–20)
CO2: 23 mmol/L (ref 22–32)
Calcium: 8.1 mg/dL — ABNORMAL LOW (ref 8.9–10.3)
Chloride: 107 mmol/L (ref 98–111)
Creatinine, Ser: 2.53 mg/dL — ABNORMAL HIGH (ref 0.44–1.00)
GFR, Estimated: 22 mL/min — ABNORMAL LOW (ref >60)
Glucose, Bld: 153 mg/dL — ABNORMAL HIGH (ref 70–99)
Potassium: 4.1 mmol/L (ref 3.5–5.1)
Sodium: 139 mmol/L (ref 135–145)

## 2020-06-09 LAB — GLUCOSE, CAPILLARY
Glucose-Capillary: 125 mg/dL — ABNORMAL HIGH (ref 70–99)
Glucose-Capillary: 104 mg/dL — ABNORMAL HIGH (ref 70–99)
Glucose-Capillary: 144 mg/dL — ABNORMAL HIGH (ref 70–99)
Glucose-Capillary: 207 mg/dL — ABNORMAL HIGH (ref 70–99)

## 2020-06-09 NOTE — Plan of Care (Signed)
  Problem: Health Behavior/Discharge Planning: Goal: Ability to manage health-related needs will improve Outcome: Progressing   Problem: Education: Goal: Ability to demonstrate management of disease process will improve Outcome: Progressing Goal: Ability to verbalize understanding of medication therapies will improve Outcome: Progressing Goal: Individualized Educational Video(s) Outcome: Progressing   Problem: Activity: Goal: Capacity to carry out activities will improve Outcome: Progressing   Problem: Cardiac: Goal: Ability to achieve and maintain adequate cardiopulmonary perfusion will improve Outcome: Progressing   

## 2020-06-09 NOTE — TOC Initial Note (Signed)
Transition of Care [facility]) - Initial/Assessment Note    Patient Details  Name: [name] MRN: [id number] Date of Birth: 1960/12/28  [facility]) CM/SW Contact:    Latoya Bence, LCSW Phone Number: 06/09/2020, 1:12 PM  Clinical Narrative:                 Patient is a 60 year old female admitted for Acute CHF (congestive heart failure). CSW observed high readmission risk score. CSW conducted risk assessment. CSW also conducted intial assessment. Patient reported that she lives at home alone and receives help from an aid 2 hours a day. Patient also reported that she would be agreeable to [facility] services if recommended. Patient is not agreeable to SNF. TOC signing off.   Expected Discharge Plan: Home/Self Care Barriers to Discharge: Continued Medical Work up   Patient Goals and CMS Choice Patient states their goals for this hospitalization and ongoing recovery are:: Return home CMS Medicare.gov Compare Post Acute Care list provided to:: Patient Choice offered to / list presented to : Patient  Expected Discharge Plan and Services Expected Discharge Plan: Home/Self Care In-house Referral: NA Discharge Planning Services: NA Post Acute Care Choice: NA                   DME Arranged: N/A DME Agency: NA       HH Arranged: NA HH Agency: NA        Prior Living Arrangements/Services     Patient language and need for interpreter reviewed:: Yes Do you feel safe going back to the place where you live?: Yes      Need for Family Participation in Patient Care: Yes (Comment) Care giver support system in place?: Yes (comment) Current home services: DME Latoya Walsh) Criminal Activity/Legal Involvement Pertinent to Current Situation/Hospitalization: No - Comment as needed  Activities of Daily Living Home Assistive Devices/Equipment: None ADL Screening (condition at time of admission) Patient's cognitive ability adequate to safely complete daily activities?: Yes Is the patient  deaf or have difficulty hearing?: No Does the patient have difficulty seeing, even when wearing glasses/contacts?: No Does the patient have difficulty concentrating, remembering, or making decisions?: No Patient able to express need for assistance with ADLs?: No Does the patient have difficulty dressing or bathing?: No Independently performs ADLs?: Yes (appropriate for developmental age) Does the patient have difficulty walking or climbing stairs?: No Weakness of Legs: Both Weakness of Arms/Hands: None  Permission Sought/Granted Permission sought to share information with : Family Supports Permission granted to share information with : Yes, Verbal Permission Granted  Share Information with NAME: Latoya Walsh  Permission granted to share info w AGENCY: Local HH agencies  Permission granted to share info w Relationship: Aide  Permission granted to share info w Contact Information: [phone]  Emotional Assessment     Affect (typically observed): Accepting,Appropriate Orientation: : Oriented to Self,Oriented to Situation,Fluctuating Orientation (Suspected and/or reported Sundowners),Oriented to Place,Oriented to  Time Alcohol / Substance Use: Not Applicable Psych Involvement: No (comment)  Admission diagnosis:  Edema [R60.9] Peripheral edema [R60.9] Acute CHF (congestive heart failure) (HCC) [I50.9] Severe hypertension [I10] Chronic kidney disease, unspecified CKD stage [N18.9] Acute congestive heart failure, unspecified heart failure type [facility]) [I50.9] Patient Active Problem List   Diagnosis Date Noted  . Acute CHF (congestive heart failure) ([place]) 06/06/2020  . Leg swelling 04/28/2020  . Hyperlipidemia LDL goal <100 04/28/2020  . Left hand pain 03/10/2020  . Anemia 02/19/2020  . Cough   . Acute  renal failure superimposed on stage 3b chronic kidney disease ([place]) 01/17/2020  . COVID-19 virus infection 12/26/2019  . Counseled about COVID-19 virus infection 10/06/2019  .  Depression, major, single episode, in partial remission ([place]) 01/25/2019  . Headache 11/13/2018  . Insomnia 09/25/2018  . Screening for colorectal cancer 07/23/2016  . Lumbar back pain with radiculopathy affecting right lower extremity 03/27/2015  . Non compliance with medical treatment 07/22/2014  . Chronic hepatitis C without hepatic coma ([place]) 12/04/2013  . Allergic rhinitis 11/03/2012  . HTN, goal below 130/80 06/25/2011  . Anxiety and depression 02/05/2011  . Nicotine dependence 11/01/2010  . Drug dependence, continuous abuse ([place]) 03/22/2010  . Controlled type 2 diabetes with neuropathy ([place]) 06/02/2007  . Alcohol abuse 06/02/2007   PCP:  Latoya Helper, MD Pharmacy:   [place], [place] [address] Phone: [phone] Fax: [phone]  [place], [place] - [address] [facility]. [address] [place] [address] Phone: [phone] Fax: [place], [place] [address] Phone: [phone] Fax: [phone]     Social Determinants of Health (SDOH) Interventions    Readmission Risk Interventions Readmission Risk Prevention Plan 06/09/2020  Transportation Screening Complete  Medication Review ([place]) Complete  PCP or Specialist appointment within 3-5 days of discharge Complete  HRI or [place] Complete  SW Recovery Care/Counseling Consult Complete  [place] Not Applicable  Some recent data might be hidden

## 2020-06-09 NOTE — Progress Notes (Signed)
PROGRESS NOTE    Latoya Walsh  [id number] DOB: Nov 09, 1960 DOA: 06/06/2020 PCP: [name], MD   Chief Complaint  Patient presents with  . Shortness of Breath / Concern for acute CHF.    Brief admission narrative: As per H&P written by Dr. Waldron Labs on 06/06/2020  Latoya Walsh  is a 60 y.o. female, with past medical history of anxiety, depression, chronic hep C, treated, diabetes mellitus type 2, hypertension, nicotine dependence, CKd stage IIIb, patient presents to ED secondary to complaints of shortness of breath and chest pain, and fluid retention, patient reports over the last 43-month she has been having progressive dyspnea, worsening lower extremity edema, and 30 to 40 pounds of fluid weight gain, reports she has been compliant with her low-salt diet, she denies any chest pain, fever, chills, mopped assist, no fever or chills, report she is not on any diuretics currently, she is still smoking. - in ED she was noted with +3 lower extremity edema, elevated BNP at 1241, baseline hemoglobin around 10.6, she is with elevated creatinine at 2.43, which is around her baseline, she had hyperglycemia at 247, she received 40 mg of IV Lasix, [facility] hospitalist consulted to admit.  Assessment & Plan: 1-acute congestive heart failure -Diastolic function indeterminant; ejection fraction 30-35%.  Global hypokinesis appreciated. -Still complaining of orthopnea, requiring oxygen supplementation and with signs of fluid overload on examination.   -Continue daily weights, low-sodium diet, strict I's and O's and IV diuresis.  - -wean off oxygen supplementation and follow clinical response. -Cardiology service has been consulted -No ARB or ACE inhibitors secondary to renal failure. -Started on beta-blocker, imdur and hydralazine.  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 -Continue glipizide  3-Anxiety and depression -Mood has remained stable. -No suicidal ideation hallucination currently. -Continue trazodone.  4-history of chronic hepatitis C without hepatic coma ([place] -Continue outpatient follow-up gastroenterology service.  5-Anemia of chronic disease -No signs of overt bleeding -Continue to follow hemoglobin trend.  6-chronic kidney disease a stage IIIb -Stable electrolytes at this time -Continue to closely follow renal function trend with active diuresis. -Continue sodium bicarb twice a day. -Creatinine 2.53 and holding stable  7-hyperlipidemia -Continue statins.  8-essential hypertension -fair control appreciated -Amlodipine has been discontinued in the setting of low ejection fraction -Patient started on Lopressor, Imdur, hydralazine and Lasix.  DVT prophylaxis: Heparin Code Status: Full code Family Communication: No family at bedside. Disposition:   Status is: Inpatient  Dispo: The patient is from: Home              Anticipated d/c is to: Home              Patient currently no medically stable for discharge; still requiring oxygen supplementation, complaining of orthopnea and short winded sensation.  Signs of fluid overload appreciated on examination.  Continue IV diuresis and follow cardiology recommendations.   Difficult to place patient no    Consultants:   Cardiology service.   Procedures:  See below for x-ray reports 2D echo: Systolic heart failure, ejection fraction 30 to 35%; global hypokinesis.  Indeterminate diastolic function   Antimicrobials:  None   Subjective: Currently no requiring oxygen supplementation and feeling breathing is better.  Denies chest pain, palpitations, nausea and vomiting.  Still expressing mild orthopnea and dyspnea on exertion.  Objective: Vitals:   06/08/20 1929 06/08/20 2026 06/09/20 0520 06/09/20 1310  BP:  (!) 143/90 133/78 ([name]  149/87  Pulse: 89 91 86 87  Resp:  '16 20 18 '$ (!) 22  Temp:  97.6 F (36.4 C) 98.1 F (36.7 C) 97.7 F (36.5 C)  TempSrc:  Oral Oral Oral  SpO2: 94% 99% 99% 99%  Weight:      Height:        Intake/Output Summary (Last 24 hours) at 06/09/2020 1655 Last data filed at 06/09/2020 1500 Gross per 24 hour  Intake 723 ml  Output 1700 ml  Net -977 ml   Filed Weights   06/06/20 1957 06/08/20 0313  Weight: 78 kg 78.1 kg    Examination: General exam: Alert, awake, oriented x 3; no requiring oxygen supplementation and feeling less short of breath.  Still reporting orthopnea and dyspnea exertion. Respiratory system: No using accessory muscles. Respiratory effort normal.  Fine crackles at the bases appreciated. Cardiovascular system: No rubs, no gallops, regular rate and rhythm. Gastrointestinal system: Abdomen is nondistended, soft and nontender. No organomegaly or masses felt. Normal bowel sounds heard. Central nervous system: Alert and oriented. No focal neurological deficits. Extremities: No cyanosis or clubbing; 1+ edema appreciated bilaterally. Skin: No rashes, no petechiae. Psychiatry: Judgement and insight appear normal. Mood & affect appropriate.     Data Reviewed: I have personally reviewed following labs and imaging studies  CBC: Recent Labs  Lab 06/06/20 1447 06/07/20 0507  WBC 6.9 6.7  NEUTROABS  --  3.8  HGB 10.6* 9.7*  HCT 35.2* 32.0*  MCV 91.0 90.4  PLT 206 [id number]    Basic Metabolic Panel: Recent Labs  Lab 06/06/20 1447 06/07/20 0507 06/08/20 0540 06/09/20 0458  NA 140 141 139 139  K 4.2 4.0 4.2 4.1  CL 114* 112* 109 107  CO2 19* 21* 21* 23  GLUCOSE 247* 238* 170* 153*  BUN 27* 26* 30* 31*  CREATININE 2.43* 2.45* 2.54* 2.53*  CALCIUM 8.2* 8.1* 8.0* 8.1*    GFR: Estimated Creatinine Clearance: 26.4 mL/min (A) (by C-G formula based on SCr of 2.53 mg/dL (H)).  Liver Function Tests: Recent Labs  Lab 06/06/20 1447  AST 26  ALT 20  ALKPHOS 87  BILITOT 0.5  PROT 6.1*  ALBUMIN 2.7*     CBG: Recent Labs  Lab 06/08/20 1616 06/08/20 2022 06/09/20 0741 06/09/20 1143 06/09/20 1623  GLUCAP 78 212* 125* 104* 144*   Radiology Studies: No results found. Scheduled Meds: . furosemide  40 mg Intravenous BID  . glipiZIDE  10 mg Oral Q breakfast  . heparin  5,000 Units Subcutaneous Q8H  . hydrALAZINE  50 mg Oral Q8H  . insulin aspart  0-15 Units Subcutaneous TID WC  . insulin aspart  0-5 Units Subcutaneous QHS  . isosorbide mononitrate  30 mg Oral Daily  . metoprolol tartrate  25 mg Oral BID  . mirtazapine  15 mg Oral QHS  . rosuvastatin  10 mg Oral Daily  . sodium bicarbonate  650 mg Oral BID  . sodium chloride flush  3 mL Intravenous Q12H   Continuous Infusions: . sodium chloride       LOS: 3 days    Time spent: 30 minutes   [name], MD [facility]   To contact the attending provider between 7A-7P or the covering provider during after hours 7P-7A, please log into the web site [email or web address] and access using universal Enterprise password for that web site. If you do not have the password, please call the hospital operator.  06/09/2020, 4:55 PM

## 2020-06-10 LAB — BASIC METABOLIC PANEL
Anion gap: 10 (ref 5–15)
BUN: 35 mg/dL — ABNORMAL HIGH (ref 6–20)
CO2: 24 mmol/L (ref 22–32)
Calcium: 8.2 mg/dL — ABNORMAL LOW (ref 8.9–10.3)
Chloride: 106 mmol/L (ref 98–111)
Creatinine, Ser: 2.81 mg/dL — ABNORMAL HIGH (ref 0.44–1.00)
GFR, Estimated: 19 mL/min — ABNORMAL LOW (ref >60)
Glucose, Bld: 124 mg/dL — ABNORMAL HIGH (ref 70–99)
Potassium: 3.9 mmol/L (ref 3.5–5.1)
Sodium: 140 mmol/L (ref 135–145)

## 2020-06-10 LAB — GLUCOSE, CAPILLARY
Glucose-Capillary: 113 mg/dL — ABNORMAL HIGH (ref 70–99)
Glucose-Capillary: 108 mg/dL — ABNORMAL HIGH (ref 70–99)
Glucose-Capillary: 178 mg/dL — ABNORMAL HIGH (ref 70–99)
Glucose-Capillary: 185 mg/dL — ABNORMAL HIGH (ref 70–99)

## 2020-06-10 MED ORDER — TORSEMIDE 20 MG PO TABS: 40.0000 mg | ORAL_TABLET | Freq: Every day | ORAL | Status: DC

## 2020-06-10 MED ORDER — METOPROLOL SUCCINATE ER 50 MG PO TB24: 50.0000 mg | ORAL_TABLET | Freq: Every day | ORAL | 3 refills | Status: DC

## 2020-06-10 MED ORDER — ISOSORBIDE MONONITRATE ER 30 MG PO TB24: 30.0000 mg | ORAL_TABLET | Freq: Every day | ORAL | 3 refills | Status: DC

## 2020-06-10 MED ORDER — HYDRALAZINE HCL 50 MG PO TABS: 50.0000 mg | ORAL_TABLET | Freq: Three times a day (TID) | ORAL | 3 refills | Status: DC

## 2020-06-10 MED ORDER — FUROSEMIDE 40 MG PO TABS: 40.0000 mg | ORAL_TABLET | Freq: Two times a day (BID) | ORAL | 3 refills | Status: DC

## 2020-06-10 NOTE — Discharge Summary (Signed)
Physician Discharge Summary  Latoya Walsh WGY:[id number] DOB: [birth year] DOA: 06/06/2020  PCP: [name], MD  Admit date: 06/06/2020 Discharge date: 06/10/2020  Time spent: 35 minutes  Recommendations for Outpatient Follow-up:  1. Repeat Basic metabolic to follow electrolytes and renal function  2. Follow CBGs/A1c and further adjust hypoglycemic regimen as needed 3. Reassess blood pressure and adjust antihypertensive regimen as required 4. Outpatient follow-up with cardiology service for repeat echo and further adjustment to heart failure medications.   Discharge Diagnoses:    Controlled type 2 diabetes with neuropathy ([place])   Anxiety and depression   Severe hypertension   Chronic hepatitis C without hepatic coma (HCC)   Anemia of chronic disease.   Acute systolic CHF (congestive heart failure) ([place])   Discharge Condition: Stable and improved.  Discharged home with instruction to follow-up with PCP and cardiology service.  CODE STATUS: Full code.  Diet recommendation: Modified carbohydrates and low-sodium diet.  Filed Weights   06/06/20 1957 06/08/20 0313 06/10/20 0521  Weight: 78 kg 78.1 kg 74 kg    History of present illness:  As per H&P written by Dr. Waldron Walsh on 06/06/2020 Latoya Walsh a60 y.o.female,with past medical history of anxiety, depression, chronic hep C, treated, diabetes mellitus type 2, hypertension, nicotine dependence,CKd stage IIIb, patient presents to ED secondary to complaints of shortness of breath and chest pain, and fluid retention, patient reports over the last 50-month she has been having progressive dyspnea, worsening lower extremity edema, and 30 to 40 pounds of fluid weight gain, reports she has been compliant with her low-salt diet, she denies any chest pain, fever, chills, mopped assist, no fever or chills, report she is not on any diuretics currently, she is still smoking. - in EDshe was noted with +3 lower extremity edema,  elevated BNP at 1241, baseline hemoglobin around 10.6, she is with elevated creatinine at 2.43, which is around her baseline, she had hyperglycemia at 247, she received 40 mg of IV Lasix,[facility]ist consulted to admit.  Hospital Course:  1-acute congestive heart failure -Diastolic function indeterminant; ejection fraction 30-35%.  Global hypokinesis appreciated. -Patient denies orthopnea, speaking in full sentences and expressing just mild shortness of breath with activity.  Trace lower extremity edema seen on exam. -Continue daily weights and low-sodium diet. -Overall approximately 8 L negative balance throughout hospitalization. -No requiring oxygen supplementation at discharge. -No ARB or ACE inhibitors secondary to renal failure. -Started on beta-blocker, imdur and hydralazine. -Outpatient follow-up with cardiology service; very appreciative of their inputs and recommendations while inpatient.  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  3-Anxiety and depression -Mood has remained stable. -No suicidal ideation hallucination currently. -Continue trazodone.  4-history of chronic hepatitis C without hepatic coma ([place] -Continue outpatient follow-up gastroenterology service.  5-Anemia of chronic disease -No signs of overt bleeding -Continue to follow hemoglobin trend. -Hemoglobin around 9.7 at discharge.  No transfusion required.  6-chronic kidney disease a stage IIIb -Overall stable electrolytes at this time -Continue sodium bicarb twice a day. -Creatinine 2.8 at discharge -Repeat basic metabolic panel follow-up visit to reassess renal function and electrolytes trend.  7-hyperlipidemia -Continue statins.  8-essential hypertension -Better blood pressure control appreciated after  medications adjustment provided. -Amlodipine has been discontinued in the setting of low ejection fraction -Patient discharged on Toprol, Imdur, hydralazine and Lasix. -Patient encouraged to follow heart healthy/low-sodium diet.  Procedures:  See below  for x-ray reports   2D echo: Systolic heart failure, ejection fraction 30 to 35%; global hypokinesis.  Indeterminate diastolic function  Consultations:  Cardiology service  Discharge Exam: Vitals:   06/10/20 0521 06/10/20 1406  BP: (!) 144/93 (!) 142/80  Pulse: 87 80  Resp: 17 18  Temp: 98 F (36.7 C) 98.6 F (37 C)  SpO2: 100% 100%    General: Afebrile, no chest pain, speaking in full sentences and denying orthopnea.  Still mildly short of breath with activity but feeling ready to go home.  Patient is no requiring oxygen supplementation. Cardiovascular: S1 and S2, no rubs, no gallops, no JVD. Respiratory: Good air movement bilaterally; no crackles, no wheezing, no using accessory muscle. Abdomen: Soft, nontender, nondistended, positive bowel sounds Extremities: No cyanosis or clubbing; trace edema appreciated bilaterally.  Discharge Instructions   Discharge Instructions    (HEART FAILURE PATIENTS) Call MD:  Anytime you have any of the following symptoms: 1) 3 pound weight gain in 24 hours or 5 pounds in 1 week 2) shortness of breath, with or without a dry hacking cough 3) swelling in the hands, feet or stomach 4) if you have to sleep on extra pillows at night in order to breathe.   Complete by: As directed    Diet - low sodium heart healthy   Complete by: As directed    Diet Carb Modified   Complete by: As directed    Discharge instructions   Complete by: As directed    Take medications as prescribed Maintain adequate hydration Arrange follow-up with PCP in 10 days Check your weight on daily basis Follow low-sodium diet (less than 2 g daily) Follow-up with cardiology service as instructed (office will contact you  with appointment details).     Allergies as of 06/10/2020      Reactions   Penicillins Itching, Rash      Medication List    STOP taking these medications   amLODipine 5 MG tablet Commonly known as: NORVASC   losartan 25 MG tablet Commonly known as: COZAAR   losartan 50 MG tablet Commonly known as: COZAAR     TAKE these medications   blood glucose meter kit and supplies Dispense based on patient and insurance preference. Once daily testing dx E11.9.   calcitRIOL 0.25 MCG capsule Commonly known as: ROCALTROL Take 0.25 mcg by mouth every Monday, Wednesday, and Friday.   ergocalciferol 1.25 MG (50000 UT) capsule Commonly known as: VITAMIN D2 Take 1 capsule (50,000 Units total) by mouth once a week. One capsule once weekly What changed: when to take this   fluticasone 50 MCG/ACT nasal spray Commonly known as: Flonase Place 1 spray into both nostrils daily.   furosemide 40 MG tablet Commonly known as: LASIX Take 1 tablet (40 mg total) by mouth 2 (two) times daily. TAKE (1) TABLET BY MOUTH THREE TIMES WEEKLY AS NEEDED FOR LEG SWELIING What changed:   medication strength  how much to take  how to take this  when to take this   gabapentin 100 MG capsule Commonly known as: NEURONTIN Take 1 capsule (100 mg total) by mouth 3 (three) times daily.   glipiZIDE 10 MG 24 hr tablet Commonly known as: GLUCOTROL XL Take 1 tablet (10 mg total) by mouth daily with breakfast.   guaiFENesin-dextromethorphan 100-10 MG/5ML syrup Commonly known as: ROBITUSSIN DM Take 5 mLs by mouth every 6 (six) hours as needed for cough.   hydrALAZINE 50 MG tablet Commonly known as: APRESOLINE Take 1  tablet (50 mg total) by mouth every 8 (eight) hours.   Ipratropium-Albuterol 20-100 MCG/ACT Aers respimat Commonly known as: COMBIVENT Inhale 1 puff into the lungs every 6 (six) hours as needed for wheezing.   isosorbide mononitrate 30 MG 24 hr tablet Commonly known as: IMDUR Take 1 tablet  (30 mg total) by mouth daily. Start taking on: June 11, 2020   loratadine 10 MG tablet Commonly known as: Claritin Take 1 tablet (10 mg total) by mouth daily.   metoprolol succinate 50 MG 24 hr tablet Commonly known as: Toprol XL Take 1 tablet (50 mg total) by mouth daily. Take with or immediately following a meal.   mirtazapine 15 MG tablet Commonly known as: Remeron Take 1 tablet (15 mg total) by mouth at bedtime.   rosuvastatin 10 MG tablet Commonly known as: Crestor Take 1 tablet (10 mg total) by mouth daily.   sodium bicarbonate 650 MG tablet Take 650 mg by mouth 2 (two) times daily.      Allergies  Allergen Reactions  . Penicillins Itching and Rash    Follow-up Information    [name], MD. Schedule an appointment as soon as possible for a visit in 10 day(s).   Specialty: Family Medicine Contact information: [address], [place] [address] [phone]               The results of significant diagnostics from this hospitalization (including imaging, microbiology, ancillary and laboratory) are listed below for reference.    Significant Diagnostic Studies: DG Chest Port 1 View  Result Date: 06/06/2020 CLINICAL DATA:  Shortness of breath with lower extremity edema EXAM: PORTABLE CHEST 1 VIEW COMPARISON:  January 17, 2020 FINDINGS: There is ill-defined airspace opacity in the right base. The lungs elsewhere are clear. Heart is enlarged with pulmonary vascularity normal. No adenopathy. There is aortic atherosclerosis. There is lower thoracic dextroscoliosis. No bone lesions. IMPRESSION: Ill-defined opacity right base concerning for pneumonia. Lungs elsewhere clear. Cardiomegaly present. No adenopathy. Aortic Atherosclerosis (ICD10-I70.0). Electronically Signed   By: Lowella Grip III M.D.   On: 06/06/2020 15:18   DG Knee 4 Views W/Patella Left  Result Date: 05/24/2020 X-rays of bilateral knees were obtained in clinic today and  demonstrates maintenance of joint space overall.  Minimal degenerative changes are appreciated.  Neutral alignment bilateral.  No acute injuries are noted.  Calcification of the vasculature in the posterior leg is noted.  Impression: Normal appearing bilateral knee x-rays   DG Knee 4 Views W/Patella Right  Result Date: 05/24/2020 X-rays of bilateral knees were obtained in clinic today and demonstrates maintenance of joint space overall.  Minimal degenerative changes are appreciated.  Neutral alignment bilateral.  No acute injuries are noted.  Calcification of the vasculature in the posterior leg is noted.  Impression: Normal appearing bilateral knee x-rays  ECHOCARDIOGRAM COMPLETE  Result Date: 06/07/2020    ECHOCARDIOGRAM REPORT   Patient Name:   ADIANA SMELCER Date of Exam: 06/07/2020 Medical Rec #:  [id number]        Height:       67.0 in Accession #:    [id number]       Weight:       172.0 lb Date of Birth:  November 21, 1960       BSA:          1.896 m Patient Age:    60 years         BP:  171/92 mmHg Patient Gender: F                HR:           94 bpm. Exam Location:  Inpatient Procedure: 2D Echo, Color Doppler and Cardiac Doppler Indications:    Dyspnea R06.00  History:        Patient has no prior history of Echocardiogram examinations.                 Risk Factors:Hypertension, Diabetes and ETOH.  Sonographer:    Bernadene Person RDCS Referring Phys: [place]  1. Left ventricular ejection fraction, by estimation, is 30 to 35%. The left ventricle has moderately decreased function. The left ventricle demonstrates global hypokinesis. Left ventricular diastolic parameters are indeterminate.  2. Right ventricular systolic function is low normal. The right ventricular size is normal. There is moderately elevated pulmonary artery systolic pressure. The estimated right ventricular systolic pressure is 16.1 mmHg.  3. A small pericardial effusion is present. The pericardial  effusion is posterior to the left ventricle.  4. The mitral valve is grossly normal. Mild mitral valve regurgitation.  5. Tricuspid valve regurgitation is moderate.  6. The aortic valve is tricuspid. Aortic valve regurgitation is not visualized.  7. The inferior vena cava is normal in size with <50% respiratory variability, suggesting right atrial pressure of 8 mmHg. FINDINGS  Left Ventricle: Left ventricular ejection fraction, by estimation, is 30 to 35%. The left ventricle has moderately decreased function. The left ventricle demonstrates global hypokinesis. The left ventricular internal cavity size was normal in size. There is no left ventricular hypertrophy. Left ventricular diastolic parameters are indeterminate. Right Ventricle: The right ventricular size is normal. No increase in right ventricular wall thickness. Right ventricular systolic function is low normal. There is moderately elevated pulmonary artery systolic pressure. The tricuspid regurgitant velocity  is 3.16 m/s, and with an assumed right atrial pressure of 8 mmHg, the estimated right ventricular systolic pressure is 09.6 mmHg. Left Atrium: Left atrial size was normal in size. Right Atrium: Right atrial size was normal in size. Pericardium: A small pericardial effusion is present. The pericardial effusion is posterior to the left ventricle. Mitral Valve: The mitral valve is grossly normal. Mild mitral valve regurgitation. Tricuspid Valve: The tricuspid valve is grossly normal. Tricuspid valve regurgitation is moderate. Aortic Valve: The aortic valve is tricuspid. There is mild aortic valve annular calcification. Aortic valve regurgitation is not visualized. Pulmonic Valve: The pulmonic valve was grossly normal. Pulmonic valve regurgitation is mild. Aorta: The aortic root is normal in size and structure. Venous: The inferior vena cava is normal in size with less than 50% respiratory variability, suggesting right atrial pressure of 8 mmHg.  IAS/Shunts: No atrial level shunt detected by color flow Doppler.  LEFT VENTRICLE PLAX 2D LVIDd:         4.80 cm      Diastology LVIDs:         3.80 cm      LV e' medial:    8.12 cm/s LV PW:         1.00 cm      LV E/e' medial:  13.5 LV IVS:        0.90 cm      LV e' lateral:   9.81 cm/s LVOT diam:     1.80 cm      LV E/e' lateral: 11.2 LV SV:         43 LV SV  Index:   23 LVOT Area:     2.54 cm  LV Volumes (MOD) LV vol d, MOD A2C: 129.0 ml LV vol d, MOD A4C: 102.0 ml LV vol s, MOD A2C: 64.8 ml LV vol s, MOD A4C: 61.4 ml LV SV MOD A2C:     64.2 ml LV SV MOD A4C:     102.0 ml LV SV MOD BP:      46.2 ml RIGHT VENTRICLE RV S prime:     9.41 cm/s TAPSE (M-mode): 1.7 cm LEFT ATRIUM             Index       RIGHT ATRIUM           Index LA diam:        3.30 cm 1.74 cm/m  RA Area:     15.30 cm LA Vol (A2C):   51.5 ml 27.16 ml/m RA Volume:   39.60 ml  20.88 ml/m LA Vol (A4C):   57.6 ml 30.37 ml/m LA Biplane Vol: 55.0 ml 29.00 ml/m  AORTIC VALVE LVOT Vmax:   96.10 cm/s LVOT Vmean:  66.600 cm/s LVOT VTI:    0.170 m  AORTA Ao Root diam: 2.90 cm Ao Asc diam:  2.80 cm MITRAL VALVE                TRICUSPID VALVE MV Area (PHT): 4.15 cm     TR Peak grad:   39.9 mmHg MV Decel Time: 183 msec     TR Vmax:        316.00 cm/s MV E velocity: 110.00 cm/s MV A velocity: 51.40 cm/s   SHUNTS MV E/A ratio:  2.14         Systemic VTI:  0.17 m                             Systemic Diam: 1.80 cm Rozann Lesches MD Electronically signed by Rozann Lesches MD Signature Date/Time: 06/07/2020/5:03:40 PM    Final    [place] ASCITES (ABDOMEN LIMITED)  Result Date: 06/06/2020 CLINICAL DATA:  Edema question ascites EXAM: LIMITED ABDOMEN ULTRASOUND FOR ASCITES TECHNIQUE: Limited ultrasound survey for ascites was performed in all four abdominal quadrants. COMPARISON:  Chest radiograph 06/06/2020 FINDINGS: No significant ascites identified. Small pleural effusions are noted at the lung bases bilaterally. Multiple shadowing calculi within gallbladder.  IMPRESSION: No evidence of ascites. Small pleural effusions. Cholelithiasis. Electronically Signed   By: Lavonia Dana M.D.   On: 06/06/2020 15:41    Microbiology: No results found for this or any previous visit (from the past 240 hour(s)).   [facility]: Basic Metabolic Panel: Recent Walsh  Lab 06/06/20 1447 06/07/20 0507 06/08/20 0540 06/09/20 0458 06/10/20 0501  NA 140 141 139 139 140  K 4.2 4.0 4.2 4.1 3.9  CL 114* 112* 109 107 106  CO2 19* 21* 21* 23 24  GLUCOSE 247* 238* 170* 153* 124*  BUN 27* 26* 30* 31* 35*  CREATININE 2.43* 2.45* 2.54* 2.53* 2.81*  CALCIUM 8.2* 8.1* 8.0* 8.1* 8.2*   Liver Function Tests: Recent Walsh  Lab 06/06/20 1447  AST 26  ALT 20  ALKPHOS 87  BILITOT 0.5  PROT 6.1*  ALBUMIN 2.7*   CBC: Recent Walsh  Lab 06/06/20 1447 06/07/20 0507  WBC 6.9 6.7  NEUTROABS  --  3.8  HGB 10.6* 9.7*  HCT 35.2* 32.0*  MCV 91.0 90.4  PLT 206 210   BNP (last 3 results)  Recent Walsh    06/06/20 1447  BNP 1,241.0*    CBG: Recent Walsh  Lab 06/09/20 2129 06/10/20 0534 06/10/20 0726 06/10/20 1103 06/10/20 1630  GLUCAP 207* 113* 108* 178* 185*    Signed:  [name] MD.  [facility] 06/10/2020, 5:03 PM

## 2020-06-10 NOTE — Consult Note (Signed)
Cardiology Consultation:   Patient ID: Latoya Walsh MRN: [id number]; DOB: 23-Jun-1960  Admit date: 06/06/2020 Date of Consult: 06/10/2020  PCP:  Latoya Helper, MD   [place]  Cardiologist:  Latoya Walsh Advanced Practice Provider:  No care team member to display Electrophysiologist:  None  [id number]    Patient Profile:   Latoya Walsh is a 60 y.o. female with a hx of DM2, HTN, CKD 3b who is being seen today for the evaluation of SOB at the request of Latoya Walsh.  History of Present Illness:   Latoya Walsh 60 yo female history of anxiety/depression, hep C treated, DM2, HTN, CKD 3b admitted with SOB, LE edema, self reported weight gain. Found to be severely volume overloaded and diagnosed with acute systolic HF this admission. Cardiology consulsted to assist with management.    Admit labs WBC 6.9 Hgb 10.6 Plt 206 K 4.2 Cr 2.43 BUN 27 BNP 1241  TSH 1.6  hstrop 30--> CXR cardiomegaly EKG SR, nonspecific ST/T changes Echo LVEF 30-35%, indet dd, low normal RV function, PASP 48, small pericardial effusion,  Past Medical History:  Diagnosis Date  . Allergic rhinitis   . Anxiety   . Chronic back pain   . Chronic hepatitis C without hepatic coma ([place])   . Depression   . Diabetes mellitus   . Hepatitis C   . Hypertension   . Noncompliance   . Poor appetite 07/17/2014  . Substance abuse ([place])    HX of drug use and alcohol use    Past Surgical History:  Procedure Laterality Date  . APPENDECTOMY  2004     Inpatient Medications: Scheduled Meds: . furosemide  40 mg Intravenous BID  . glipiZIDE  10 mg Oral Q breakfast  . heparin  5,000 Units Subcutaneous Q8H  . hydrALAZINE  50 mg Oral Q8H  . insulin aspart  0-15 Units Subcutaneous TID WC  . insulin aspart  0-5 Units Subcutaneous QHS  . isosorbide mononitrate  30 mg Oral Daily  . metoprolol tartrate  25 mg Oral BID  . mirtazapine  15 mg Oral QHS  . rosuvastatin  10 mg Oral Daily  .  sodium bicarbonate  650 mg Oral BID  . sodium chloride flush  3 mL Intravenous Q12H   Continuous Infusions: . sodium chloride     PRN Meds: sodium chloride, acetaminophen, ondansetron (ZOFRAN) IV, sodium chloride flush  Allergies:    Allergies  Allergen Reactions  . Penicillins Itching and Rash    Social History:   Social History   Socioeconomic History  . Marital status: Single    Spouse name: Not on file  . Number of children: 3  . Years of education: Not on file  . Highest education level: Not on file  Occupational History  . Occupation: Disabled  Tobacco Use  . Smoking status: Light Tobacco Smoker    Packs/day: 0.25    Types: Cigarettes  . Smokeless tobacco: Never Used  Substance and Sexual Activity  . Alcohol use: Not Currently    Alcohol/week: 40.0 standard drinks    Types: 40 Cans of beer per week    Comment: 2 16oz cans of beer a day  . Drug use: Not Currently    Types: Marijuana, Cocaine    Comment: Last smoked marijuana yesterday 10/18/14  . Sexual activity: Yes    Birth control/protection: Condom  Other Topics Concern  . Not on file  Social History Narrative  . Not on  file   Social Determinants of Health   Financial Resource Strain: Not on file  Food Insecurity: Not on file  Transportation Needs: Not on file  Physical Activity: Not on file  Stress: Not on file  Social Connections: Not on file  Intimate Partner Violence: Not on file    Family History:    Family History  Problem Relation Age of Onset  . Diabetes Sister        Twin - AIDS      ROS:  Please see the history of present illness.   All other ROS reviewed and negative.     Physical Exam/Data:   Vitals:   06/09/20 1310 06/09/20 2000 06/10/20 0521 06/10/20 1406  BP: (!) 149/87 (!) 158/96 (!) 144/93 (!) 142/80  Pulse: 87 86 87 80  Resp: (!) '22 18 17 18  '$ Temp: 97.7 F (36.5 C) 97.7 F (36.5 C) 98 F (36.7 C) 98.6 F (37 C)  TempSrc: Oral Oral  Oral  SpO2: 99% 99% 100%  100%  Weight:   74 kg   Height:        Intake/Output Summary (Last 24 hours) at 06/10/2020 1459 Last data filed at 06/10/2020 0915 Gross per 24 hour  Intake 3 ml  Output 550 ml  Net -547 ml   Last 3 Weights 06/10/2020 06/08/2020 06/06/2020  Weight (lbs) 163 lb 2.3 oz 172 lb 2.9 oz 171 lb 15.3 oz  Weight (kg) 74 kg 78.1 kg 78 kg     Body mass index is 25.55 kg/m.  General:  Well nourished, well developed, in no acute distress HEENT: normal Lymph: no adenopathy Neck: no JVD Endocrine:  No thryomegaly Vascular: No carotid bruits; FA pulses 2+ bilaterally without bruits  Cardiac:  normal S1, S2; RRR; no murmur  Lungs:  clear to auscultation bilaterally, no wheezing, rhonchi or rales  Abd: soft, nontender, no hepatomegaly  Ext: no edema Musculoskeletal:  No deformities, BUE and BLE strength normal and equal Skin: warm and dry  Neuro:  CNs 2-12 intact, no focal abnormalities noted Psych:  Normal affect     Laboratory Data:  High Sensitivity Troponin:   Recent Labs  Lab 06/06/20 1726  TROPONINIHS 30*     Chemistry Recent Labs  Lab 06/08/20 0540 06/09/20 0458 06/10/20 0501  NA 139 139 140  K 4.2 4.1 3.9  CL 109 107 106  CO2 21* 23 24  GLUCOSE 170* 153* 124*  BUN 30* 31* 35*  CREATININE 2.54* 2.53* 2.81*  CALCIUM 8.0* 8.1* 8.2*  GFRNONAA 21* 22* 19*  ANIONGAP '9 9 10    '$ Recent Labs  Lab 06/06/20 1447  PROT 6.1*  ALBUMIN 2.7*  AST 26  ALT 20  ALKPHOS 87  BILITOT 0.5   Hematology Recent Labs  Lab 06/06/20 1447 06/07/20 0507  WBC 6.9 6.7  RBC 3.87 3.54*  HGB 10.6* 9.7*  HCT 35.2* 32.0*  MCV 91.0 90.4  MCH 27.4 27.4  MCHC 30.1 30.3  RDW 14.5 14.5  PLT 206 210   BNP Recent Labs  Lab 06/06/20 1447  BNP 1,241.0*    DDimer No results for input(s): DDIMER in the last 168 hours.   Radiology/Studies:  ECHOCARDIOGRAM COMPLETE  Result Date: 06/07/2020    ECHOCARDIOGRAM REPORT   Patient Name:   Latoya Walsh Date of Exam: 06/07/2020 Medical Rec #:   [id number]        Height:       67.0 in Accession #:    [id number]  Weight:       172.0 lb Date of Birth:  [birth year]       BSA:          1.896 m Patient Age:    72 years         BP:           171/92 mmHg Patient Gender: F                HR:           94 bpm. Exam Location:  Inpatient Procedure: 2D Echo, Color Doppler and Cardiac Doppler Indications:    Dyspnea R06.00  History:        Patient has no prior history of Echocardiogram examinations.                 Risk Factors:Hypertension, Diabetes and ETOH.  Sonographer:    Bernadene Person RDCS Referring Phys: [place]  1. Left ventricular ejection fraction, by estimation, is 30 to 35%. The left ventricle has moderately decreased function. The left ventricle demonstrates global hypokinesis. Left ventricular diastolic parameters are indeterminate.  2. Right ventricular systolic function is low normal. The right ventricular size is normal. There is moderately elevated pulmonary artery systolic pressure. The estimated right ventricular systolic pressure is [id number] mmHg.  3. A small pericardial effusion is present. The pericardial effusion is posterior to the left ventricle.  4. The mitral valve is grossly normal. Mild mitral valve regurgitation.  5. Tricuspid valve regurgitation is moderate.  6. The aortic valve is tricuspid. Aortic valve regurgitation is not visualized.  7. The inferior vena cava is normal in size with <50% respiratory variability, suggesting right atrial pressure of 8 mmHg. FINDINGS  Left Ventricle: Left ventricular ejection fraction, by estimation, is 30 to 35%. The left ventricle has moderately decreased function. The left ventricle demonstrates global hypokinesis. The left ventricular internal cavity size was normal in size. There is no left ventricular hypertrophy. Left ventricular diastolic parameters are indeterminate. Right Ventricle: The right ventricular size is normal. No increase in right ventricular wall  thickness. Right ventricular systolic function is low normal. There is moderately elevated pulmonary artery systolic pressure. The tricuspid regurgitant velocity  is 3.16 m/s, and with an assumed right atrial pressure of 8 mmHg, the estimated right ventricular systolic pressure is [id number] mmHg. Left Atrium: Left atrial size was normal in size. Right Atrium: Right atrial size was normal in size. Pericardium: A small pericardial effusion is present. The pericardial effusion is posterior to the left ventricle. Mitral Valve: The mitral valve is grossly normal. Mild mitral valve regurgitation. Tricuspid Valve: The tricuspid valve is grossly normal. Tricuspid valve regurgitation is moderate. Aortic Valve: The aortic valve is tricuspid. There is mild aortic valve annular calcification. Aortic valve regurgitation is not visualized. Pulmonic Valve: The pulmonic valve was grossly normal. Pulmonic valve regurgitation is mild. Aorta: The aortic root is normal in size and structure. Venous: The inferior vena cava is normal in size with less than 50% respiratory variability, suggesting right atrial pressure of 8 mmHg. IAS/Shunts: No atrial level shunt detected by color flow Doppler.  LEFT VENTRICLE PLAX 2D LVIDd:         4.80 cm      Diastology LVIDs:         3.80 cm      LV e' medial:    8.12 cm/s LV PW:         1.00 cm      LV  E/e' medial:  13.5 LV IVS:        0.90 cm      LV e' lateral:   9.81 cm/s LVOT diam:     1.80 cm      LV E/e' lateral: 11.2 LV SV:         43 LV SV Index:   23 LVOT Area:     2.54 cm  LV Volumes (MOD) LV vol d, MOD A2C: 129.0 ml LV vol d, MOD A4C: 102.0 ml LV vol s, MOD A2C: 64.8 ml LV vol s, MOD A4C: 61.4 ml LV SV MOD A2C:     64.2 ml LV SV MOD A4C:     102.0 ml LV SV MOD BP:      46.2 ml RIGHT VENTRICLE RV S prime:     9.41 cm/s TAPSE (M-mode): 1.7 cm LEFT ATRIUM             Index       RIGHT ATRIUM           Index LA diam:        3.30 cm 1.74 cm/m  RA Area:     15.30 cm LA Vol (A2C):   51.5 ml 27.16  ml/m RA Volume:   39.60 ml  20.88 ml/m LA Vol (A4C):   57.6 ml 30.37 ml/m LA Biplane Vol: 55.0 ml 29.00 ml/m  AORTIC VALVE LVOT Vmax:   96.10 cm/s LVOT Vmean:  66.600 cm/s LVOT VTI:    0.170 m  AORTA Ao Root diam: 2.90 cm Ao Asc diam:  2.80 cm MITRAL VALVE                TRICUSPID VALVE MV Area (PHT): 4.15 cm     TR Peak grad:   39.9 mmHg MV Decel Time: 183 msec     TR Vmax:        316.00 cm/s MV E velocity: 110.00 cm/s MV A velocity: 51.40 cm/s   SHUNTS MV E/A ratio:  2.14         Systemic VTI:  0.17 m                             Systemic Diam: 1.80 cm Rozann Lesches MD Electronically signed by Rozann Lesches MD Signature Date/Time: 06/07/2020/5:03:40 PM    Final    [place] ASCITES (ABDOMEN LIMITED)  Result Date: 06/06/2020 CLINICAL DATA:  Edema question ascites EXAM: LIMITED ABDOMEN ULTRASOUND FOR ASCITES TECHNIQUE: Limited ultrasound survey for ascites was performed in all four abdominal quadrants. COMPARISON:  Chest radiograph 06/06/2020 FINDINGS: No significant ascites identified. Small pleural effusions are noted at the lung bases bilaterally. Multiple shadowing calculi within gallbladder. IMPRESSION: No evidence of ascites. Small pleural effusions. Cholelithiasis. Electronically Signed   By: Lavonia Dana M.D.   On: 06/06/2020 15:41     Assessment and Plan:   1. Acute systolic HF - new diagnosis of systolic HF this admission, echo with LVEF 30-35% - BNP 1241 on admission - from recorded I/Os negative 6 L since admission. Weights labile but suggest 8 lbs weight loss. Uptrend in Cr today, she has been on IV lasix '40mg'$  bid.   - she is on lopressor '25mg'$  bid, hydral '50mg'$  tid, imdur '30mg'$  daily. No ACE/ARB/ARNI/aldactone due to renal dysfunction.  - would change to toprol '50mg'$  once daily, oral lasix '40mg'$  bid.   Epic notes mention history of substance abuse, she denies any recent [facility] or drug  use Renal function would prohibit cath at this time - would treat medically, repeat echo 3 months to  reassess LV function.    3. CKD - baseline 2 to 2.2  - up to 2.8 with diuresis.    [place] for discharge, we will arrange oupatient f/u     For questions or updates, please contact [place] Please consult [email or web address] for contact info under    Signed, Carlyle Dolly, MD  06/10/2020 2:59 PM

## 2020-06-10 NOTE — Progress Notes (Signed)
Pt has discharge orders, discharge instructions given and no further questions at this time, pt is aware of new medications to begin and when to begin them and has no additional questions at this time. Transportation contacted via [place] and transport in transit from [facility] to pick up pt to take to destination. Awaiting their arrival.

## 2020-06-10 NOTE — Progress Notes (Signed)
[facility] transport contacted and advised that they would be able to transport pt to her residence,  [address] [place]  [phone]

## 2020-06-11 ENCOUNTER — Telehealth: Payer: Self-pay | Admitting: *Deleted

## 2020-06-11 NOTE — Telephone Encounter (Signed)
Transition Care Management Unsuccessful Follow-up Telephone Call  Date of discharge and from where: 06-10-2020  Attempts:  1st  Reason for unsuccessful TCM follow-up call: unable to leave message

## 2020-06-12 ENCOUNTER — Telehealth: Payer: Self-pay | Admitting: *Deleted

## 2020-06-12 NOTE — Telephone Encounter (Signed)
Transition Care Management Follow-up Telephone Call  Date of discharge and from where: 06/10/2020 - [facility]  How have you been since you were released from the hospital? "Fine"  Any questions or concerns? No  Items Reviewed:  Did the pt receive and understand the discharge instructions provided? Yes   Medications obtained and verified? Yes   Other? No   Any new allergies since your discharge? No   Dietary orders reviewed? No  Do you have support at home? Yes   Home Care and Equipment/Supplies: Were home health services ordered? not applicable If so, what is the name of the agency? N/A  Has the agency set up a time to come to the patient's home? not applicable Were any new equipment or medical supplies ordered?  No What is the name of the medical supply agency? N/A Were you able to get the supplies/equipment? not applicable Do you have any questions related to the use of the equipment or supplies? No  Functional Questionnaire: (I = Independent and D = Dependent) ADLs: I  Bathing/Dressing- I  Meal Prep- I  Eating- I  Maintaining continence- I  Transferring/Ambulation- I  Managing Meds- I  Follow up appointments reviewed:   PCP Hospital f/u appt confirmed? Yes  Scheduled to see Dr. [name] on 07/18/2020 @ 0920.  [facility] f/u appt confirmed? Yes  Scheduled to see Cardiology on 06/26/2020 @ 1430.  Are transportation arrangements needed? No   If their condition worsens, is the pt aware to call PCP or go to the Emergency Dept.? Yes  Was the patient provided with contact information for the PCP's office or ED? Yes  Was to pt encouraged to call back with questions or concerns? Yes

## 2020-06-25 NOTE — Progress Notes (Addendum)
Cardiology Office Note    Date:  06/26/2020   ID:  Latoya Walsh, Latoya Walsh 08-16-1960, MRN [id number]  PCP:  [name], MD  Cardiologist: Carlyle Dolly, MD    Chief Complaint  Patient presents with  . Hospitalization Follow-up    History of Present Illness:    Latoya Walsh is a 60 y.o. female with past medical history of HTN, HLD, Stage 3 CKD, Hepatitis C, history of substance use and recently diagnosed cardiomyopathy (EF 30-35% by echo in 05/2020) who presents to the office today for hospital follow-up.   She was most recently admitted to [facility] from 3/17 - 06/10/2020 for evaluation of worsening dyspnea and lower extremity edema. BNP was found to be elevated to 1241 and echocardiogram showed a reduced EF of 30 to 35% with global HK. RV function was at the low-end of normal and she had moderately elevated PASP at 47.9 mmHg. Also noted to have a small pericardial effusion, mild MR and moderate TR. She responded well to IV Lasix and diuresed over 6L. She was started on medical therapy for her cardiomyopathy to include Toprol-XL 44m daily, Hydralazine 545mTID, Imdur 3081maily and Lasix 51m18mD. Was not started on an ACE-I/ARB/ARNI/Aldactone given her CKD (creatinine 2.81 at hospital discharge). Medical management was recommended as renal function would prohibit cath and it was recommended she have a repeat echo in 3 months for reassessment.   In talking with the patient today, she reports much improvement in her dyspnea since her recent hospitalization. Her weight has also declined an additional 4 pounds since hospital discharge. She denies any specific orthopnea, PND, chest pain or palpitations. She does report some lower extremity edema and tries to elevate her lower extremities as much as possible. She does not cook frequently but says her sister she lives with has a caregiver who helps prepare their meals. The caregiver does add salt to the meals at times.  The patient  is unsure of the names of the medications she is currently taking but says she takes ALL of her medications 3 times a day. She is unsure if she is taking Hydralazine, Imdur or Toprol-XL.   Past Medical History:  Diagnosis Date  . Allergic rhinitis   . Anxiety   . Chronic back pain   . Chronic hepatitis C without hepatic coma (HCC)[place]. Depression   . Diabetes mellitus   . Hepatitis C   . Hypertension   . Noncompliance   . Poor appetite 07/17/2014  . Substance abuse (HCC)[place] HX of drug use and alcohol use    Past Surgical History:  Procedure Laterality Date  . APPENDECTOMY  2004    Current Medications: Outpatient Medications Prior to Visit  Medication Sig Dispense Refill  . blood glucose meter kit and supplies Dispense based on patient and insurance preference. Once daily testing dx E11.9. 1 each 0  . calcitRIOL (ROCALTROL) 0.25 MCG capsule Take 0.25 mcg by mouth every Monday, Wednesday, and Friday.    . ergocalciferol (VITAMIN D2) 1.25 MG (50000 UT) capsule Take 1 capsule (50,000 Units total) by mouth once a week. One capsule once weekly (Patient taking differently: Take 50,000 Units by mouth every Monday. One capsule once weekly) 12 capsule 2  . fluticasone (FLONASE) 50 MCG/ACT nasal spray Place 1 spray into both nostrils daily. 16 g 1  . furosemide (LASIX) 40 MG tablet Take 1 tablet (40 mg total) by mouth 2 (two) times daily. TAKE (  1) TABLET BY MOUTH THREE TIMES WEEKLY AS NEEDED FOR LEG SWELIING 60 tablet 3  . gabapentin (NEURONTIN) 100 MG capsule Take 1 capsule (100 mg total) by mouth 3 (three) times daily. 90 capsule 2  . glipiZIDE (GLUCOTROL XL) 10 MG 24 hr tablet Take 1 tablet (10 mg total) by mouth daily with breakfast. 90 tablet 1  . guaiFENesin-dextromethorphan (ROBITUSSIN DM) 100-10 MG/5ML syrup Take 5 mLs by mouth every 6 (six) hours as needed for cough. 236 mL 0  . hydrALAZINE (APRESOLINE) 50 MG tablet Take 1 tablet (50 mg total) by mouth every 8 (eight) hours. 90 tablet 3   . Ipratropium-Albuterol (COMBIVENT) 20-100 MCG/ACT AERS respimat Inhale 1 puff into the lungs every 6 (six) hours as needed for wheezing. 4 g 1  . loratadine (CLARITIN) 10 MG tablet Take 1 tablet (10 mg total) by mouth daily. 30 tablet 1  . mirtazapine (REMERON) 15 MG tablet Take 1 tablet (15 mg total) by mouth at bedtime. 30 tablet 4  . rosuvastatin (CRESTOR) 10 MG tablet Take 1 tablet (10 mg total) by mouth daily. 90 tablet 3  . sodium bicarbonate 650 MG tablet Take 650 mg by mouth 2 (two) times daily.    . isosorbide mononitrate (IMDUR) 30 MG 24 hr tablet Take 1 tablet (30 mg total) by mouth daily. 30 tablet 3  . metoprolol succinate (TOPROL XL) 50 MG 24 hr tablet Take 1 tablet (50 mg total) by mouth daily. Take with or immediately following a meal. 30 tablet 3   No facility-administered medications prior to visit.     Allergies:   Penicillins   Social History   Socioeconomic History  . Marital status: Single    Spouse name: Not on file  . Number of children: 3  . Years of education: Not on file  . Highest education level: Not on file  Occupational History  . Occupation: Disabled  Tobacco Use  . Smoking status: Light Tobacco Smoker    Packs/day: 0.25    Types: Cigarettes  . Smokeless tobacco: Never Used  Vaping Use  . Vaping Use: Never used  Substance and Sexual Activity  . Alcohol use: Not Currently    Alcohol/week: 40.0 standard drinks    Types: 40 Cans of beer per week    Comment: 2 16oz cans of beer a day  . Drug use: Not Currently    Types: Marijuana, Cocaine    Comment: Last smoked marijuana yesterday 10/18/14  . Sexual activity: Yes    Birth control/protection: Condom  Other Topics Concern  . Not on file  Social History Narrative  . Not on file   Social Determinants of Health   Financial Resource Strain: Not on file  Food Insecurity: Not on file  Transportation Needs: Not on file  Physical Activity: Not on file  Stress: Not on file  Social Connections:  Not on file     Family History:  The patient's family history includes Diabetes in her sister.   Review of Systems:   Please see the history of present illness.     General:  No chills, fever, night sweats or weight changes.  Cardiovascular:  No chest pain, dyspnea on exertion,  orthopnea, palpitations, paroxysmal nocturnal dyspnea. Positive for edema.  Dermatological: No rash, lesions/masses Respiratory: No cough, dyspnea Urologic: No hematuria, dysuria Abdominal:   No nausea, vomiting, diarrhea, bright red blood per rectum, melena, or hematemesis Neurologic:  No visual changes, wkns, changes in mental status. All other systems reviewed and  are otherwise negative except as noted above.   Physical Exam:    VS:  BP (!) 148/88   Pulse 84   Ht 5' 7"  (1.702 m)   Wt 159 lb (72.1 kg)   LMP 06/23/2010   SpO2 96%   BMI 24.90 kg/m    General: Well developed, well nourished,female appearing in no acute distress. Head: Normocephalic, atraumatic. Neck: No carotid bruits. JVD not elevated.  Lungs: Respirations regular and unlabored, without wheezes or rales.  Heart: Regular rate and rhythm. No S3 or S4.  No murmur, no rubs, or gallops appreciated. Abdomen: Appears non-distended. No obvious abdominal masses. Msk:  Strength and tone appear normal for age. No obvious joint deformities or effusions. Extremities: No clubbing or cyanosis. Trace lower extremity edema.  Distal pedal pulses are 2+ bilaterally. Neuro: Alert and oriented X 3. Moves all extremities spontaneously. No focal deficits noted. Psych:  Responds to questions appropriately with a normal affect. Skin: No rashes or lesions noted  Wt Readings from Last 3 Encounters:  06/26/20 159 lb (72.1 kg)  06/10/20 163 lb 2.3 oz (74 kg)  05/24/20 178 lb (80.7 kg)     Studies/Labs Reviewed:   EKG:  EKG is not ordered today.    Recent Labs: 06/06/2020: ALT 20; B Natriuretic Peptide 1,241.0 06/07/2020: Hemoglobin 9.7; Platelets 210;  TSH 1.673 06/10/2020: BUN 35; Creatinine, Ser 2.81; Potassium 3.9; Sodium 140   Lipid Panel    Component Value Date/Time   CHOL 167 05/02/2020 1535   TRIG 277 (H) 05/02/2020 1535   HDL 34 (L) 05/02/2020 1535   CHOLHDL 4.9 (H) 05/02/2020 1535   CHOLHDL 4.8 03/28/2019 1056   VLDL 14 10/02/2016 0821   LDLCALC 87 05/02/2020 1535   LDLCALC 136 (H) 03/28/2019 1056    Additional studies/ records that were reviewed today include:   Echocardiogram: 05/2020 IMPRESSIONS    1. Left ventricular ejection fraction, by estimation, is 30 to 35%. The  left ventricle has moderately decreased function. The left ventricle  demonstrates global hypokinesis. Left ventricular diastolic parameters are  indeterminate.  2. Right ventricular systolic function is low normal. The right  ventricular size is normal. There is moderately elevated pulmonary artery  systolic pressure. The estimated right ventricular systolic pressure is  57.2 mmHg.  3. A small pericardial effusion is present. The pericardial effusion is  posterior to the left ventricle.  4. The mitral valve is grossly normal. Mild mitral valve regurgitation.  5. Tricuspid valve regurgitation is moderate.  6. The aortic valve is tricuspid. Aortic valve regurgitation is not  visualized.  7. The inferior vena cava is normal in size with <50% respiratory  variability, suggesting right atrial pressure of 8 mmHg.   Assessment:    1. Chronic systolic heart failure ([place])   2. Essential hypertension   3. Mixed hyperlipidemia   4. CKD (chronic kidney disease) stage 4, GFR 15-29 ml/min (HCC)      Plan:   In order of problems listed above:  1. Chronic Systolic CHF - Echocardiogram during her admission showed a reduced EF of 30 to 35% with global HK, moderately elevated PASP at 47.9 mmHg, a small pericardial effusion, mild MR and moderate TR.  - She only has trace edema on examination today and lungs are clear. Her weight has also declined  by an additional 4 pounds since hospital discharge. We reviewed her echocardiogram results in detail today as she reports she was unaware that her heart was weakened. We also reviewed the importance  of compliance with medical therapy in trying to help with the pumping function of her heart as she is not a cardiac catheterization candidate given her advanced CKD.   - She was provided with a medication list today and we have also reached out to [facility] in regards to arranging a pill pack. Would recommend continuing on Toprol-XL 12m daily, Hydralazine 517mTID and Imdur 3077maily. Will defer diuretic dosing to Nephrology but she is currently on Lasix 65m74mD (or TID as patient reports taking her current medications three times daily). Not on an ACE-I/ARB/ARNI/Aldactone given her CKD and did not add an SGLT2 inhibitor today given her variable renal function. Once we can clarify her medications and confirm what she is taking regularly, would plan to further titrate her medical therapy with plans for a repeat echo in 3 months once her medications have been optimized.   2. HTN - Her BP is elevated at 148/88 during today's visit but as outlined above she is unsure of what she is currently taking or how frequently. We did go through her medication list in detail today and made symbols for if she should take the medication once daily, twice daily or three times daily.  Also asked nursing staff to reach out to [facility] if they can arrange for her to have a pill pack to help with compliance.  3. HLD - Followed by PCP.  FLP in 04/2020 showed total cholesterol 167, triglycerides 277, HDL 34 and LDL 87. She remains on Crestor 10 mg daily  4. Stage 4 CKD - Her creatinine was at 2.81 upon the time of hospital discharge on 06/10/2020. She does report having labs along with follow-up scheduled with Nephrology tomorrow. She is followed by Dr. BhutTheador Hawthornehopefully labs will be available for  review in Care Everywhere.   Medication Adjustments/Labs and Tests Ordered: Current medicines are reviewed at length with the patient today.  Concerns regarding medicines are outlined above.  Medication changes, Labs and Tests ordered today are listed in the Patient Instructions below. Patient Instructions  Medication Instructions:  Your physician recommends that you continue on your current medications as directed. Please refer to the Current Medication list given to you today.  Please see pharmacy in regards to having pill pack done.   *If you need a refill on your cardiac medications before your next appointment, please call your pharmacy*   Lab Work: NONE   If you have labs (blood work) drawn today and your tests are completely normal, you will receive your results only by: . MyMarland Kitchenhart Message (if you have MyChart) OR . A paper copy in the mail If you have any lab test that is abnormal or we need to change your treatment, we will call you to review the results.   Testing/Procedures: NONE   Follow-Up: At [facility] and your health needs are our priority.  As part of our continuing mission to provide you with exceptional heart care, we have created designated Provider Care Teams.  These Care Teams include your primary Cardiologist (physician) and Advanced Practice Providers (APPs -  Physician Assistants and Nurse Practitioners) who all work together to provide you with the care you need, when you need it.  We recommend signing up for the patient portal called "MyChart".  Sign up information is provided on this After Visit Summary.  MyChart is used to connect with patients for Virtual Visits (Telemedicine).  Patients are able to view lab/test results, encounter notes,  upcoming appointments, etc.  Non-urgent messages can be sent to your provider as well.   To learn more about what you can do with MyChart, go to [email or web address].    Your next appointment:   4-6  week(s)  The format for your next appointment:   In Person  Provider:   Carlyle Dolly, MD or Bernerd Pho, PA-C   Other Instructions Thank you for choosing [facility]!    Signed, Erma Heritage, PA-C  06/26/2020 5:19 PM    [facility] [address] [place], [place] [address] Phone: [phone] Fax: [phone]

## 2020-06-26 ENCOUNTER — Encounter: Payer: Self-pay | Admitting: Student

## 2020-06-26 ENCOUNTER — Ambulatory Visit (INDEPENDENT_AMBULATORY_CARE_PROVIDER_SITE_OTHER): Payer: Medicaid Other | Admitting: Student

## 2020-06-26 ENCOUNTER — Other Ambulatory Visit: Payer: Self-pay

## 2020-06-26 VITALS — BP 148/88 | HR 84 | Ht 67.0 in | Wt 159.0 lb

## 2020-06-26 DIAGNOSIS — N184 Chronic kidney disease, stage 4 (severe): Secondary | ICD-10-CM | POA: Diagnosis not present

## 2020-06-26 DIAGNOSIS — I5022 Chronic systolic (congestive) heart failure: Secondary | ICD-10-CM

## 2020-06-26 DIAGNOSIS — I10 Essential (primary) hypertension: Secondary | ICD-10-CM

## 2020-06-26 DIAGNOSIS — E782 Mixed hyperlipidemia: Secondary | ICD-10-CM | POA: Diagnosis not present

## 2020-06-26 MED ORDER — METOPROLOL SUCCINATE ER 50 MG PO TB24: 50.0000 mg | ORAL_TABLET | Freq: Every day | ORAL | 3 refills | Status: DC

## 2020-06-26 MED ORDER — ISOSORBIDE MONONITRATE ER 30 MG PO TB24: 30.0000 mg | ORAL_TABLET | Freq: Every day | ORAL | 3 refills | Status: DC

## 2020-06-26 NOTE — Patient Instructions (Signed)
Medication Instructions:  Your physician recommends that you continue on your current medications as directed. Please refer to the Current Medication list given to you today.  Please see pharmacy in regards to having pill pack done.   *If you need a refill on your cardiac medications before your next appointment, please call your pharmacy*   Lab Work: NONE   If you have labs (blood work) drawn today and your tests are completely normal, you will receive your results only by: [name] MyChart Message (if you have MyChart) OR . A paper copy in the mail If you have any lab test that is abnormal or we need to change your treatment, we will call you to review the results.   Testing/Procedures: NONE   Follow-Up: At [facility], you and your health needs are our priority.  As part of our continuing mission to provide you with exceptional heart care, we have created designated Provider Care Teams.  These Care Teams include your primary Cardiologist (physician) and Advanced Practice Providers (APPs -  Physician Assistants and Nurse Practitioners) who all work together to provide you with the care you need, when you need it.  We recommend signing up for the patient portal called "MyChart".  Sign up information is provided on this After Visit Summary.  MyChart is used to connect with patients for Virtual Visits (Telemedicine).  Patients are able to view lab/test results, encounter notes, upcoming appointments, etc.  Non-urgent messages can be sent to your provider as well.   To learn more about what you can do with MyChart, go to [email or web address].    Your next appointment:   4-6 week(s)  The format for your next appointment:   In Person  Provider:   Carlyle Dolly, MD or Bernerd Pho, PA-C   Other Instructions Thank you for choosing [facility]!

## 2020-06-27 DIAGNOSIS — E1122 Type 2 diabetes mellitus with diabetic chronic kidney disease: Secondary | ICD-10-CM | POA: Diagnosis not present

## 2020-06-27 DIAGNOSIS — N041 Nephrotic syndrome with focal and segmental glomerular lesions: Secondary | ICD-10-CM | POA: Diagnosis not present

## 2020-06-27 DIAGNOSIS — I129 Hypertensive chronic kidney disease with stage 1 through stage 4 chronic kidney disease, or unspecified chronic kidney disease: Secondary | ICD-10-CM | POA: Diagnosis not present

## 2020-06-27 DIAGNOSIS — N189 Chronic kidney disease, unspecified: Secondary | ICD-10-CM | POA: Diagnosis not present

## 2020-06-27 DIAGNOSIS — R808 Other proteinuria: Secondary | ICD-10-CM | POA: Diagnosis not present

## 2020-06-27 DIAGNOSIS — I5023 Acute on chronic systolic (congestive) heart failure: Secondary | ICD-10-CM | POA: Diagnosis not present

## 2020-06-27 DIAGNOSIS — D638 Anemia in other chronic diseases classified elsewhere: Secondary | ICD-10-CM | POA: Diagnosis not present

## 2020-07-09 ENCOUNTER — Ambulatory Visit: Payer: Medicaid Other | Admitting: Family Medicine

## 2020-07-15 ENCOUNTER — Other Ambulatory Visit: Payer: Self-pay | Admitting: Family Medicine

## 2020-07-18 ENCOUNTER — Other Ambulatory Visit: Payer: Self-pay

## 2020-07-18 ENCOUNTER — Ambulatory Visit (INDEPENDENT_AMBULATORY_CARE_PROVIDER_SITE_OTHER): Payer: Medicaid Other | Admitting: Family Medicine

## 2020-07-18 ENCOUNTER — Encounter: Payer: Self-pay | Admitting: Family Medicine

## 2020-07-18 VITALS — BP 130/84 | HR 88 | Resp 16 | Ht 67.0 in | Wt 150.0 lb

## 2020-07-18 DIAGNOSIS — I10 Essential (primary) hypertension: Secondary | ICD-10-CM

## 2020-07-18 DIAGNOSIS — E785 Hyperlipidemia, unspecified: Secondary | ICD-10-CM | POA: Diagnosis not present

## 2020-07-18 DIAGNOSIS — F17218 Nicotine dependence, cigarettes, with other nicotine-induced disorders: Secondary | ICD-10-CM | POA: Diagnosis not present

## 2020-07-18 DIAGNOSIS — E1140 Type 2 diabetes mellitus with diabetic neuropathy, unspecified: Secondary | ICD-10-CM

## 2020-07-18 DIAGNOSIS — I5021 Acute systolic (congestive) heart failure: Secondary | ICD-10-CM

## 2020-07-18 NOTE — Assessment & Plan Note (Signed)
[name] is reminded of the importance of commitment to daily physical activity for 30 minutes or more, as able and the need to limit carbohydrate intake to 30 to 60 grams per meal to help with blood sugar control.   The need to take medication as prescribed, test blood sugar as directed, and to call between visits if there is a concern that blood sugar is uncontrolled is also discussed.   [name] is reminded of the importance of daily foot exam, annual eye examination, and good blood sugar, blood pressure and cholesterol control.  Diabetic Labs Latest Ref Rng & Units 06/10/2020 06/09/2020 06/08/2020 06/07/2020 06/06/2020  HbA1c 4.8 - 5.6 % - - - 7.7(H) -  Microalbumin mg/dL - - - - -  Micro/Creat Ratio 0.0 - 30.0 mg/g - - - - -  Chol 100 - 199 mg/dL - - - - -  HDL >39 mg/dL - - - - -  Calc LDL 0 - 99 mg/dL - - - - -  Triglycerides 0 - 149 mg/dL - - - - -  Creatinine 0.44 - 1.00 mg/dL 2.81(H) 2.53(H) 2.54(H) 2.45(H) 2.43(H)   BP/Weight 07/18/2020 06/26/2020 06/10/2020 05/24/2020 05/08/2020 123XX123 [id number]  Systolic BP A999333 123456 A999333 123456 Q000111Q 123456 0000000  Diastolic BP 84 88 80 74 96 88 112  Wt. (Lbs) 150 159 163.14 178 162 162 151  BMI 23.49 24.9 25.55 27.88 26.15 25.37 23.65   Foot/eye exam completion dates 03/05/2020 01/24/2019  Foot Form Completion Done Done

## 2020-07-18 NOTE — Patient Instructions (Addendum)
F/U last week in June , call if you need me sooner  Non fast HBa1C, chem 7 and EGFr June 20 and appt the following week  Please order once daily test supplies with meter  You are referred for eye exam  PLEASE bring ALL medications to next visit  Work on stopping cigarettes and all drugs other than prescription medication  Keep appt on May 5 at 10 am with heart Doctor    Thanks for choosing [facility], we consider it a privelige to serve you.

## 2020-07-21 ENCOUNTER — Encounter: Payer: Self-pay | Admitting: Family Medicine

## 2020-07-21 NOTE — Assessment & Plan Note (Addendum)
Hyperlipidemia:Low fat diet discussed and encouraged.   Lipid Panel  Lab Results  Component Value Date   CHOL 167 05/02/2020   HDL 34 (L) 05/02/2020   LDLCALC 87 05/02/2020   TRIG 277 (H) 05/02/2020   CHOLHDL 4.9 (H) 05/02/2020  uncontrolled , not at goal , needs to reduce fat in diet and comply with medication

## 2020-07-21 NOTE — Progress Notes (Signed)
Latoya Walsh     MRN: [id number]      DOB: [birth year]   HPI Latoya Walsh is here for follow up and re-evaluation of chronic medical conditions, medication management and review of any available recent lab and radiology data.  Hospitalized from 3/17 to 3/21. With acute systolic heart failure. Denies symptoms of heart failure currrently and has no medication with her and is unable to reliably report. Has no meter or test supplies and will be prescribed same Denies polyuria, polydipsia, blurred vision , or hypoglycemic episodes.    ROS Denies recent fever or chills. Denies sinus pressure, nasal congestion, ear pain or sore throat. Denies chest congestion, productive cough or wheezing. Denies chest pains, palpitations and leg swelling Denies abdominal pain, nausea, vomiting,diarrhea or constipation.   Denies dysuria, frequency, hesitancy or incontinence. C/p chronic arm pain Denies headaches, seizures,  Denies uncontrolled depression, anxiety or insomnia. Denies skin break down or rash.   PE  BP 130/84   Pulse 88   Resp 16   Ht '5\' 7"'$  (1.702 m)   Wt 150 lb (68 kg)   LMP 06/23/2010   SpO2 98%   BMI 23.49 kg/m   Patient alert and oriented and in no cardiopulmonary distress.  HEENT: No facial asymmetry, EOMI,     Neck supple .  Chest: Clear to auscultation bilaterally.  CVS: S1, S2 no murmurs, no S3.Regular rate.  ABD: Soft non tender.   Ext: No edema  MS: Adequate ROM spine, shoulders, hips and knees.  Skin: Intact, no ulcerations or rash noted.  Psych: Good eye contact, CNS: CN 2-12 intact, power,  normal throughout.no focal deficits noted.   Assessment & Plan  Controlled type 2 diabetes with neuropathy ([place]) Latoya Walsh is reminded of the importance of commitment to daily physical activity for 30 minutes or more, as able and the need to limit carbohydrate intake to 30 to 60 grams per meal to help with blood sugar control.   The need to take medication as  prescribed, test blood sugar as directed, and to call between visits if there is a concern that blood sugar is uncontrolled is also discussed.   Latoya Walsh is reminded of the importance of daily foot exam, annual eye examination, and good blood sugar, blood pressure and cholesterol control.  Diabetic Labs Latest Ref Rng & Units 06/10/2020 06/09/2020 06/08/2020 06/07/2020 06/06/2020  HbA1c 4.8 - 5.6 % - - - 7.7(H) -  Microalbumin mg/dL - - - - -  Micro/Creat Ratio 0.0 - 30.0 mg/g - - - - -  Chol 100 - 199 mg/dL - - - - -  HDL >39 mg/dL - - - - -  Calc LDL 0 - 99 mg/dL - - - - -  Triglycerides 0 - 149 mg/dL - - - - -  Creatinine 0.44 - 1.00 mg/dL 2.81(H) 2.53(H) 2.54(H) 2.45(H) 2.43(H)   BP/Weight 07/18/2020 06/26/2020 06/10/2020 05/24/2020 05/08/2020 [id number]  Systolic BP A999333 123456 A999333 123456 Q000111Q 123456 0000000  Diastolic BP 84 88 80 74 96 88 112  Wt. (Lbs) 150 159 163.14 178 162 162 151  BMI 23.49 24.9 25.55 27.88 26.15 25.37 23.65   Foot/eye exam completion dates 03/05/2020 01/24/2019  Foot Form Completion Done Done        Acute CHF (congestive heart failure) (HCC) Resolved symptoms and no signs of decompensation on exam Needs to keep cardiology follow up  Hyperlipidemia LDL goal <100 Hyperlipidemia:Low fat diet discussed and encouraged.   Lipid Panel  Lab Results  Component Value Date   CHOL 167 05/02/2020   HDL 34 (L) 05/02/2020   LDLCALC 87 05/02/2020   TRIG 277 (H) 05/02/2020   CHOLHDL 4.9 (H) 05/02/2020  uncontrolled , not at goal , needs to reduce fat in diet and comply with medication    Nicotine dependence Asked:confirms currently smokes cigarettes Assess: Unwilling to set a quit date, but is cutting back Advise: needs to QUIT to reduce risk of cancer, cardio and cerebrovascular disease Assist: counseled for 5 minutes and literature provided Arrange: follow up in 2 to 4 months   Severe hypertension Controlled, no change in medication DASH diet and commitment to  daily physical activity for a minimum of 30 minutes discussed and encouraged, as a part of hypertension management. The importance of attaining a healthy weight is also discussed.  BP/Weight 07/18/2020 06/26/2020 06/10/2020 05/24/2020 05/08/2020 123XX123 A999333  Systolic BP [id number] 123456 A999333 123456 [id number] 123456 0000000  Diastolic BP 84 88 80 74 96 88 112  Wt. (Lbs) 150 159 163.14 178 162 162 151  BMI 23.49 24.9 25.55 27.88 26.15 25.37 23.65

## 2020-07-21 NOTE — Assessment & Plan Note (Signed)
Controlled, no change in medication DASH diet and commitment to daily physical activity for a minimum of 30 minutes discussed and encouraged, as a part of hypertension management. The importance of attaining a healthy weight is also discussed.  BP/Weight 07/18/2020 06/26/2020 06/10/2020 05/24/2020 05/08/2020 [id number] [id number]  Systolic BP [id number] 123456 A999333 123456 Q000111Q 123456 0000000  Diastolic BP 84 88 80 74 96 88 112  Wt. (Lbs) 150 159 163.14 178 162 162 151  BMI 23.49 24.9 25.55 27.88 26.15 25.37 23.65

## 2020-07-21 NOTE — Assessment & Plan Note (Signed)
Asked:confirms currently smokes cigarettes °Assess: Unwilling to set a quit date, but is cutting back °Advise: needs to QUIT to reduce risk of cancer, cardio and cerebrovascular disease °Assist: counseled for 5 minutes and literature provided °Arrange: follow up in 2 to 4 months ° °

## 2020-07-21 NOTE — Assessment & Plan Note (Signed)
Resolved symptoms and no signs of decompensation on exam Needs to keep cardiology follow up

## 2020-07-25 ENCOUNTER — Ambulatory Visit (INDEPENDENT_AMBULATORY_CARE_PROVIDER_SITE_OTHER): Payer: Medicaid Other | Admitting: Cardiology

## 2020-07-25 ENCOUNTER — Telehealth: Payer: Self-pay | Admitting: Family Medicine

## 2020-07-25 ENCOUNTER — Other Ambulatory Visit: Payer: Self-pay

## 2020-07-25 ENCOUNTER — Encounter: Payer: Self-pay | Admitting: Cardiology

## 2020-07-25 VITALS — BP 134/80 | HR 85 | Ht 67.0 in | Wt 151.0 lb

## 2020-07-25 DIAGNOSIS — I10 Essential (primary) hypertension: Secondary | ICD-10-CM | POA: Diagnosis not present

## 2020-07-25 DIAGNOSIS — I5022 Chronic systolic (congestive) heart failure: Secondary | ICD-10-CM

## 2020-07-25 MED ORDER — METOPROLOL SUCCINATE ER 50 MG PO TB24: 50.0000 mg | ORAL_TABLET | Freq: Every day | ORAL | 3 refills | Status: DC

## 2020-07-25 MED ORDER — ISOSORBIDE MONONITRATE ER 30 MG PO TB24: 30.0000 mg | ORAL_TABLET | Freq: Every day | ORAL | 3 refills | Status: DC

## 2020-07-25 MED ORDER — FUROSEMIDE 40 MG PO TABS: 40.0000 mg | ORAL_TABLET | Freq: Two times a day (BID) | ORAL | 3 refills | Status: DC

## 2020-07-25 NOTE — Patient Instructions (Signed)
Medication Instructions: Your physician has recommended you make the following change in your medication:  START: Toprol- XL 50 mg tablets daily    Imdur 30 mg tablets daily    Furosemide 40 mg tablets twice daily  *If you need a refill on your cardiac medications before your next appointment, please call your pharmacy*   Lab Work: None If you have labs (blood work) drawn today and your tests are completely normal, you will receive your results only by: [name] MyChart Message (if you have MyChart) OR . A paper copy in the mail If you have any lab test that is abnormal or we need to change your treatment, we will call you to review the results.   Testing/Procedures: None   Follow-Up: At [facility], you and your health needs are our priority.  As part of our continuing mission to provide you with exceptional heart care, we have created designated Provider Care Teams.  These Care Teams include your primary Cardiologist (physician) and Advanced Practice Providers (APPs -  Physician Assistants and Nurse Practitioners) who all work together to provide you with the care you need, when you need it.  We recommend signing up for the patient portal called "MyChart".  Sign up information is provided on this After Visit Summary.  MyChart is used to connect with patients for Virtual Visits (Telemedicine).  Patients are able to view lab/test results, encounter notes, upcoming appointments, etc.  Non-urgent messages can be sent to your provider as well.   To learn more about what you can do with MyChart, go to [email or web address].    Your next appointment:   6 week(s)  The format for your next appointment:   In Person  Provider:   You will see one of the following Advanced Practice Providers on your designated Care Team:    Bernerd Pho, [place]     Other Instructions

## 2020-07-25 NOTE — Progress Notes (Signed)
Clinical Summary Ms. Mergenthaler is a 60 y.o.female  1. Chronic systolic HF - new diagnosis during 05/2020 admisino - 05/2020 echo LVEF 30-35% - cath not pursued due to renal dysfunction  - no recent SOB/DOE, no LE edema - medication adherence has been an ongoing issue. Today she does not have her toprol, imdur, lasix.    2. CKD stage IV - followed by nephrology Dr Theador Hawthorne - from notes had nephrtoic range proteinuria, she is on ARB per renal    Past Medical History:  Diagnosis Date  . Allergic rhinitis   . Anxiety   . Chronic back pain   . Chronic hepatitis C without hepatic coma ([place])   . Depression   . Diabetes mellitus   . Hepatitis C   . Hypertension   . Noncompliance   . Poor appetite 07/17/2014  . Substance abuse ([place])    HX of drug use and alcohol use     Allergies  Allergen Reactions  . Penicillins Itching and Rash     Current Outpatient Medications  Medication Sig Dispense Refill  . amLODipine (NORVASC) 5 MG tablet TAKE ONE TABLET BY MOUTH ONCE DAILY. 30 tablet 0  . blood glucose meter kit and supplies Dispense based on patient and insurance preference. Once daily testing dx E11.9. 1 each 0  . calcitRIOL (ROCALTROL) 0.25 MCG capsule Take 0.25 mcg by mouth every Monday, Wednesday, and Friday.    . ergocalciferol (VITAMIN D2) 1.25 MG (50000 UT) capsule Take 1 capsule (50,000 Units total) by mouth once a week. One capsule once weekly (Patient taking differently: Take 50,000 Units by mouth every Monday. One capsule once weekly) 12 capsule 2  . fluticasone (FLONASE) 50 MCG/ACT nasal spray Place 1 spray into both nostrils daily. 16 g 1  . furosemide (LASIX) 40 MG tablet Take 1 tablet (40 mg total) by mouth 2 (two) times daily. TAKE (1) TABLET BY MOUTH THREE TIMES WEEKLY AS NEEDED FOR LEG SWELIING 60 tablet 3  . gabapentin (NEURONTIN) 100 MG capsule Take 1 capsule (100 mg total) by mouth 3 (three) times daily. 90 capsule 2  . glipiZIDE (GLUCOTROL XL) 10 MG 24 hr  tablet Take 1 tablet (10 mg total) by mouth daily with breakfast. 90 tablet 1  . glipiZIDE (GLUCOTROL XL) 5 MG 24 hr tablet TAKE 1 TABLET BY MOUTH DAILY WITH BREAKFAST. 90 tablet 0  . guaiFENesin-dextromethorphan (ROBITUSSIN DM) 100-10 MG/5ML syrup Take 5 mLs by mouth every 6 (six) hours as needed for cough. 236 mL 0  . hydrALAZINE (APRESOLINE) 50 MG tablet Take 1 tablet (50 mg total) by mouth every 8 (eight) hours. 90 tablet 3  . Ipratropium-Albuterol (COMBIVENT) 20-100 MCG/ACT AERS respimat Inhale 1 puff into the lungs every 6 (six) hours as needed for wheezing. 4 g 1  . isosorbide mononitrate (IMDUR) 30 MG 24 hr tablet Take 1 tablet (30 mg total) by mouth daily. 90 tablet 3  . loratadine (CLARITIN) 10 MG tablet Take 1 tablet (10 mg total) by mouth daily. 30 tablet 1  . metoprolol succinate (TOPROL XL) 50 MG 24 hr tablet Take 1 tablet (50 mg total) by mouth daily. Take with or immediately following a meal. 90 tablet 3  . mirtazapine (REMERON) 15 MG tablet Take 1 tablet (15 mg total) by mouth at bedtime. 30 tablet 4  . rosuvastatin (CRESTOR) 10 MG tablet Take 1 tablet (10 mg total) by mouth daily. 90 tablet 3  . sodium bicarbonate 650 MG tablet Take 650  mg by mouth 2 (two) times daily.     No current facility-administered medications for this visit.     Past Surgical History:  Procedure Laterality Date  . APPENDECTOMY  2004     Allergies  Allergen Reactions  . Penicillins Itching and Rash      Family History  Problem Relation Age of Onset  . Diabetes Sister        Twin - AIDS      Social History Ms. Renzulli reports that she has been smoking cigarettes. She has been smoking about 0.25 packs per day. She has never used smokeless tobacco. Ms. Buhl reports previous alcohol use of about 40.0 standard drinks of alcohol per week.   Review of Systems CONSTITUTIONAL: No weight loss, fever, chills, weakness or fatigue.  HEENT: Eyes: No visual loss, blurred vision, double vision or  yellow sclerae.No hearing loss, sneezing, congestion, runny nose or sore throat.  SKIN: No rash or itching.  CARDIOVASCULAR: per hpi RESPIRATORY: No shortness of breath, cough or sputum.  GASTROINTESTINAL: No anorexia, nausea, vomiting or diarrhea. No abdominal pain or blood.  GENITOURINARY: No burning on urination, no polyuria NEUROLOGICAL: No headache, dizziness, syncope, paralysis, ataxia, numbness or tingling in the extremities. No change in bowel or bladder control.  MUSCULOSKELETAL: No muscle, back pain, joint pain or stiffness.  LYMPHATICS: No enlarged nodes. No history of splenectomy.  PSYCHIATRIC: No history of depression or anxiety.  ENDOCRINOLOGIC: No reports of sweating, cold or heat intolerance. No polyuria or polydipsia.  [name]   Physical Examination Today's Vitals   07/25/20 0955  BP: 134/80  Pulse: 85  SpO2: 99%  Weight: 151 lb (68.5 kg)  Height: 5' 7"  (1.702 m)   Body mass index is 23.65 kg/m.  Gen: resting comfortably, no acute distress HEENT: no scleral icterus, pupils equal round and reactive, no palptable cervical adenopathy,  CV: RRR, no m/r/g, no jvd Resp: Clear to auscultation bilaterally GI: abdomen is soft, non-tender, non-distended, normal bowel sounds, no hepatosplenomegaly MSK: extremities are warm, no edema.  Skin: warm, no rash Neuro:  no focal deficits Psych: appropriate affect   Diagnostic Studies  05/2020 echo IMPRESSIONS    1. Left ventricular ejection fraction, by estimation, is 30 to 35%. The  left ventricle has moderately decreased function. The left ventricle  demonstrates global hypokinesis. Left ventricular diastolic parameters are  indeterminate.  2. Right ventricular systolic function is low normal. The right  ventricular size is normal. There is moderately elevated pulmonary artery  systolic pressure. The estimated right ventricular systolic pressure is  01.6 mmHg.  3. A small pericardial effusion is present. The pericardial  effusion is  posterior to the left ventricle.  4. The mitral valve is grossly normal. Mild mitral valve regurgitation.  5. Tricuspid valve regurgitation is moderate.  6. The aortic valve is tricuspid. Aortic valve regurgitation is not  visualized.  7. The inferior vena cava is normal in size with <50% respiratory  variability, suggesting right atrial pressure of 8 mmHg.    Assessment and Plan   1. Chronic systolic HF - new diagnosis during 05/2020 admission - management has been limited due to advanced kidney dysfunction, limited medication adherence. Has not been taking her toprol, imdur,lasix - will represribe these meds, will see if SW and [facility] can help with home medication adherence.   EKG today shows NSR      [name], M.D.

## 2020-07-25 NOTE — Telephone Encounter (Signed)
..   Medicaid Managed Care   Unsuccessful Outreach Note  07/25/2020 Name: JAYLENNE GRISEZ MRN: [id number] DOB: [birth year]  Referred by: [name], MD Reason for referral : No chief complaint on file.   An unsuccessful telephone outreach was attempted today. The patient was referred to the case management team for assistance with care management and care coordination.   Follow Up Plan: The care management team will reach out to the patient again over the next 7 days.   [place] [phone]

## 2020-07-26 ENCOUNTER — Telehealth: Payer: Self-pay | Admitting: Licensed Clinical Social Worker

## 2020-07-26 NOTE — Telephone Encounter (Signed)
LCSW called and was able to reach pt at [phone]. I introduced self, role, reason for call. Call is scattered as pt engaging with people around her during the call. Inquired if I could call pt back at a later time. We agreed I could call pt back at 3pm today. Pt did state before I hung up that "I take my medications," and did not seem interested in support for that however will explore further when I call her back.   Westley Hummer, MSW, [place]  [phone]

## 2020-07-26 NOTE — Progress Notes (Signed)
Heart and Vascular Care Navigation  07/26/2020  Latoya Walsh February 20, 1961 [id number]  Reason for Referral: Engaged with patient by telephone for initial visit for Heart and Vascular Care Coordination.                                                                                                Assessment:   LCSW called pt back as discussed this morning. Re-introduced self, role, reason for call. Pt confirmed again her home address, PCP, and emergency contacts. She has an aide that comes from 2-5pm Mon-Fri. She denies issues with affording or accessing her medications, food, utilities or housing. She receives SNAP and SSI at this time. When asked specifically about her medications she shares that she now has them all but that she does not have a pill box and then asks this writer to come to her house to set up the pill box. LCSW shared that that is not something that I am able to do but that I could make a referral to the community paramedicine program in [place] to see if she is an appropriate candidate. Pt okay with this referral.   At this time no additional questions or concerns shared by pt with this Probation officer.                                     HRT/VAS Care Coordination    Patients Home Cardiology Office [facility]   Outpatient Care Team Social Worker   Social Worker Name: Westley Hummer, LCSW, [place] arrangements for the past 2 months Single Family Home   Lives with: Self   Patient Current Insurance Coverage Medicaid   Patient Has Concern With Paying Medical Bills No   Does Patient Have Prescription Coverage? Yes   Home Assistive Devices/Equipment None   DME Agency NA   [place] Agency NA   Current home services Homehealth aide      Social History:                                                                             SDOH Screenings   Alcohol Screen: Not on file  Depression (PHQ2-9): Medium Risk  . PHQ-2 Score: 9  Financial Resource  Strain: Low Risk   . Difficulty of Paying Living Expenses: Not hard at all  Food Insecurity: No Food Insecurity  . Worried About Charity fundraiser in the Last Year: Never true  . Ran Out of Food in the Last Year: Never true  Housing: Low Risk   . Last Housing Risk Score: 0  Physical Activity: Not on file  Social Connections: Not on file  Stress: Not on file  Tobacco Use: High Risk  . Smoking Tobacco  Use: Light Tobacco Smoker  . Smokeless Tobacco Use: Never Used  Transportation Needs: No Transportation Needs  . Lack of Transportation (Medical): No  . Lack of Transportation (Non-Medical): No    SDOH Interventions: Financial Resources:  Sales promotion account executive Interventions: Patient Refused   Food Insecurity:  Food Insecurity Interventions: Intervention Not Indicated (pt receives [employer])  Housing Insecurity:  Housing Interventions: Intervention Not Indicated  Transportation:   Transportation Interventions: [employer] (pt has utilized [employer]; states her aide takes her to appts)   Other Care Navigation Interventions:     Provided Pharmacy assistance resources Will mail pt a pill box; referral to [employer] for [employer].   Follow-up plan:   LCSW currently working remotely, will complete referral for [facility] when back in the office and f/u with pt once referral has been submitted.

## 2020-07-29 ENCOUNTER — Telehealth: Payer: Self-pay | Admitting: Licensed Clinical Social Worker

## 2020-07-29 NOTE — Telephone Encounter (Signed)
LCSW has sent paramedicine referral form and pill box to [facility] for Dr. Nelly Laurence team and pt to receive/complete. Will f/u tomorrow to see if received yet.   Westley Hummer, MSW, [place]  [phone]

## 2020-07-31 ENCOUNTER — Telehealth: Payer: Self-pay | Admitting: Licensed Clinical Social Worker

## 2020-07-31 NOTE — Telephone Encounter (Signed)
Confirmation message from Christella Scheuermann that pt has come by and signed paperwork and Dr. Harl Bowie has also signed paperwork for [facility] program. Will f/u later this week with their team to ensure it was received. Appreciate [facility] team's assistance with this referral.   Westley Hummer, MSW, [place]  [phone]

## 2020-08-06 ENCOUNTER — Telehealth: Payer: Self-pay | Admitting: Licensed Clinical Social Worker

## 2020-08-06 NOTE — Telephone Encounter (Signed)
LCSW called [facility] paramedicine program to f/u on referral for pt. Message left at referral line [phone].  Westley Hummer, MSW, [place]  [phone]

## 2020-08-08 ENCOUNTER — Telehealth: Payer: Self-pay | Admitting: Licensed Clinical Social Worker

## 2020-08-08 NOTE — Telephone Encounter (Signed)
Received a return call from [facility] that pt has been referred to [facility], paramedic w/ their team. LCSW has left message for [facility] to f/u. Remain available for additional questions/concerns.   Westley Hummer, MSW, [place]  [phone]

## 2020-08-09 ENCOUNTER — Other Ambulatory Visit: Payer: Self-pay

## 2020-08-09 ENCOUNTER — Telehealth: Payer: Self-pay | Admitting: Licensed Clinical Social Worker

## 2020-08-09 ENCOUNTER — Other Ambulatory Visit: Payer: Self-pay | Admitting: *Deleted

## 2020-08-09 DIAGNOSIS — R808 Other proteinuria: Secondary | ICD-10-CM | POA: Diagnosis not present

## 2020-08-09 DIAGNOSIS — D638 Anemia in other chronic diseases classified elsewhere: Secondary | ICD-10-CM | POA: Diagnosis not present

## 2020-08-09 DIAGNOSIS — N041 Nephrotic syndrome with focal and segmental glomerular lesions: Secondary | ICD-10-CM | POA: Diagnosis not present

## 2020-08-09 DIAGNOSIS — N189 Chronic kidney disease, unspecified: Secondary | ICD-10-CM | POA: Diagnosis not present

## 2020-08-09 DIAGNOSIS — I5023 Acute on chronic systolic (congestive) heart failure: Secondary | ICD-10-CM | POA: Diagnosis not present

## 2020-08-09 DIAGNOSIS — I129 Hypertensive chronic kidney disease with stage 1 through stage 4 chronic kidney disease, or unspecified chronic kidney disease: Secondary | ICD-10-CM | POA: Diagnosis not present

## 2020-08-09 DIAGNOSIS — E1122 Type 2 diabetes mellitus with diabetic chronic kidney disease: Secondary | ICD-10-CM | POA: Diagnosis not present

## 2020-08-09 NOTE — Patient Instructions (Signed)
Visit Information  Latoya Walsh was given information about Medicaid Managed Care team care coordination services as a part of their [place] benefit. Latoya Walsh verbally consented to engagement with the [facility] Managed Care team.   For questions related to your [facility], please call: [phone] or visit the homepage here: [email or web address]  If you would like to schedule transportation through your [facility], please call the following number at least 2 days in advance of your appointment: [phone].   Call the [place] at [phone], at any time, 24 hours a day, 7 days a week. If you are in danger or need immediate medical attention call 911.  Latoya Walsh - following are the goals we discussed in your visit today:  Goals Addressed            This Visit's Progress   . Monitor and Manage My Blood Sugar-Diabetes Type 2       Timeframe:  Long-Range Goal Priority:  Medium Start Date:  08/09/20                           Expected End Date:  11/09/20                     Follow Up Date  08/28/20   - adhere to a diabetic diet - check blood sugar at prescribed times - check blood sugar if I feel it is too high or too low - enter blood sugar readings and medication or insulin into daily log - take the blood sugar log to all doctor visits - take the blood sugar meter to all doctor visits    Why is this important?    Checking your blood sugar at home helps to keep it from getting very high or very low.   Writing the results in a diary or log helps the doctor know how to care for you.   Your blood sugar log should have the time, date and the results.   Also, write down the amount of insulin or other medicine that you take.   Other information, like what you ate, exercise done and how you were feeling, will also be  helpful.         . Track and Manage Fluids and Swelling-Heart Failure       Timeframe:  Long-Range Goal Priority:  High Start Date:    08/09/20                         Expected End Date:    11/09/20                   Follow Up Date 08/28/20   - call office if I gain more than 2 pounds in one day or 5 pounds in one week - use salt in moderation - watch for swelling in feet, ankles and legs every day - weigh myself daily    Why is this important?    It is important to check your weight daily and watch how much salt and liquids you have.   It will help you to manage your heart failure.        . Track and Manage Symptoms-Heart Failure       Timeframe:  Long-Range Goal Priority:  High Start Date:   08/09/20  Expected End Date:    11/09/20                   Follow Up Date 08/28/20    - eat more whole grains, fruits and vegetables, lean meats and healthy fats - know when to call the doctor - track symptoms and what helps feel better or worse - dress right for the weather, hot or cold    Why is this important?    You will be able to handle your symptoms better if you keep track of them.   Making some simple changes to your lifestyle will help.   Eating healthy is one thing you can do to take good care of yourself.           Please see education materials related to CHF and diabetes provided as print materials.     Diabetes Mellitus and Nutrition, Adult When you have diabetes, or diabetes mellitus, it is very important to have healthy eating habits because your blood sugar (glucose) levels are greatly affected by what you eat and drink. Eating healthy foods in the right amounts, at about the same times every day, can help you:  Control your blood glucose.  Lower your risk of heart disease.  Improve your blood pressure.  Reach or maintain a healthy weight. What can affect my meal plan? Every person with diabetes is different, and each person has  different needs for a meal plan. Your health care provider may recommend that you work with a dietitian to make a meal plan that is best for you. Your meal plan may vary depending on factors such as:  The calories you need.  The medicines you take.  Your weight.  Your blood glucose, blood pressure, and cholesterol levels.  Your activity level.  Other health conditions you have, such as heart or kidney disease. How do carbohydrates affect me? Carbohydrates, also called carbs, affect your blood glucose level more than any other type of food. Eating carbs naturally raises the amount of glucose in your blood. Carb counting is a method for keeping track of how many carbs you eat. Counting carbs is important to keep your blood glucose at a healthy level, especially if you use insulin or take certain oral diabetes medicines. It is important to know how many carbs you can safely have in each meal. This is different for every person. Your dietitian can help you calculate how many carbs you should have at each meal and for each snack. How does alcohol affect me? Alcohol can cause a sudden decrease in blood glucose (hypoglycemia), especially if you use insulin or take certain oral diabetes medicines. Hypoglycemia can be a life-threatening condition. Symptoms of hypoglycemia, such as sleepiness, dizziness, and confusion, are similar to symptoms of having too much alcohol.  Do not drink alcohol if: ? Your health care provider tells you not to drink. ? You are pregnant, may be pregnant, or are planning to become pregnant.  If you drink alcohol: ? Do not drink on an empty stomach. ? Limit how much you use to:  0-1 drink a day for women.  0-2 drinks a day for men. ? Be aware of how much alcohol is in your drink. In the [place] one drink equals one 12 oz bottle of beer (355 mL), one 5 oz glass of wine (148 mL), or one 1 oz glass of hard liquor (44 mL). ? Keep yourself hydrated with water, diet soda, or  unsweetened iced tea.  Keep in mind that regular soda, juice, and other mixers may contain a lot of sugar and must be counted as carbs. What are tips for following this plan? Reading food labels  Start by checking the serving size on the "Nutrition Facts" label of packaged foods and drinks. The amount of calories, carbs, fats, and other nutrients listed on the label is based on one serving of the item. Many items contain more than one serving per package.  Check the total grams (g) of carbs in one serving. You can calculate the number of servings of carbs in one serving by dividing the total carbs by 15. For example, if a food has 30 g of total carbs per serving, it would be equal to 2 servings of carbs.  Check the number of grams (g) of saturated fats and trans fats in one serving. Choose foods that have a low amount or none of these fats.  Check the number of milligrams (mg) of salt (sodium) in one serving. Most people should limit total sodium intake to less than 2,300 mg per day.  Always check the nutrition information of foods labeled as "low-fat" or "nonfat." These foods may be higher in added sugar or refined carbs and should be avoided.  Talk to your dietitian to identify your daily goals for nutrients listed on the label. Shopping  Avoid buying canned, pre-made, or processed foods. These foods tend to be high in fat, sodium, and added sugar.  Shop around the outside edge of the grocery store. This is where you will most often find fresh fruits and vegetables, bulk grains, fresh meats, and fresh dairy. Cooking  Use low-heat cooking methods, such as baking, instead of high-heat cooking methods like deep frying.  Cook using healthy oils, such as olive, canola, or sunflower oil.  Avoid cooking with butter, cream, or high-fat meats. Meal planning  Eat meals and snacks regularly, preferably at the same times every day. Avoid going long periods of time without eating.  Eat foods  that are high in fiber, such as fresh fruits, vegetables, beans, and whole grains. Talk with your dietitian about how many servings of carbs you can eat at each meal.  Eat 4-6 oz (112-168 g) of lean protein each day, such as lean meat, chicken, fish, eggs, or tofu. One ounce (oz) of lean protein is equal to: ? 1 oz (28 g) of meat, chicken, or fish. ? 1 egg. ?  cup (62 g) of tofu.  Eat some foods each day that contain healthy fats, such as avocado, nuts, seeds, and fish.   What foods should I eat? Fruits Berries. Apples. Oranges. Peaches. Apricots. Plums. Grapes. Mango. Papaya. Pomegranate. Kiwi. Cherries. Vegetables Lettuce. Spinach. Leafy greens, including kale, chard, collard greens, and mustard greens. Beets. Cauliflower. Cabbage. Broccoli. Carrots. Green beans. Tomatoes. Peppers. Onions. Cucumbers. Brussels sprouts. Grains Whole grains, such as whole-wheat or whole-grain bread, crackers, tortillas, cereal, and pasta. Unsweetened oatmeal. Quinoa. Brown or wild rice. Meats and other proteins Seafood. Poultry without skin. Lean cuts of poultry and beef. Tofu. Nuts. Seeds. Dairy Low-fat or fat-free dairy products such as milk, yogurt, and cheese. The items listed above may not be a complete list of foods and beverages you can eat. Contact a dietitian for more information. What foods should I avoid? Fruits Fruits canned with syrup. Vegetables Canned vegetables. Frozen vegetables with butter or cream sauce. Grains Refined white flour and flour products such as bread, pasta, snack foods, and cereals. Avoid all processed foods. Meats  and other proteins Fatty cuts of meat. Poultry with skin. Breaded or fried meats. Processed meat. Avoid saturated fats. Dairy Full-fat yogurt, cheese, or milk. Beverages Sweetened drinks, such as soda or iced tea. The items listed above may not be a complete list of foods and beverages you should avoid. Contact a dietitian for more information. Questions  to ask a health care provider  Do I need to meet with a diabetes educator?  Do I need to meet with a dietitian?  What number can I call if I have questions?  When are the best times to check my blood glucose? Where to find more information:  American Diabetes Association: diabetes.org  Academy of Nutrition and Dietetics: www.eatright.[employer] of Diabetes and Digestive and Kidney Diseases: [employer]inesFuneral.dk  Association of Diabetes Care and Education Specialists: www.diabeteseducator.org Summary  It is important to have healthy eating habits because your blood sugar (glucose) levels are greatly affected by what you eat and drink.  A healthy meal plan will help you control your blood glucose and maintain a healthy lifestyle.  Your health care provider may recommend that you work with a dietitian to make a meal plan that is best for you.  Keep in mind that carbohydrates (carbs) and alcohol have immediate effects on your blood glucose levels. It is important to count carbs and to use alcohol carefully. This information is not intended to replace advice given to you by your health care provider. Make sure you discuss any questions you have with your health care provider. Document Revised: 02/14/2019 Document Reviewed: 02/14/2019 Elsevier Patient Education  2021 [place].  Type 2 Diabetes Mellitus, Self-Care, Adult When you have type 2 diabetes (type 2 diabetes mellitus), you must make sure your blood sugar (glucose) stays in a healthy range. You can do this with:  Nutrition.  Exercise.  Lifestyle changes.  Medicines or insulin, if needed.  Support from your doctors and others. What are the risks? Having diabetes can raise your risk for other long-term (chronic) health problems. You may get medicines to help prevent these problems. How to stay aware of blood sugar  Check your blood sugar level every day, as often as told.  Have your A1C (hemoglobin A1C)  level checked two or more times a year. Have it checked more often if told.  Your doctor will set personal treatment goals for you. In general, you should have these blood sugar levels: ? Before meals: 80-130 mg/dL (4.4-7.2 mmol/L). ? After meals: below 180 mg/dL (10 mmol/L). ? A1C: less than 7%.   How to manage high and low blood sugar Symptoms of high blood sugar High blood sugar is also called hyperglycemia. Know the symptoms of high blood sugar. These may include:  More thirst.  Hunger.  Feeling very tired.  Needing to pee (urinate) more often than normal.  Seeing things blurry. Symptoms of low blood sugar Low blood sugar is also called hypoglycemia. This is when blood sugar is at or below 70 mg/dL (3.9 mmol/L). Symptoms may include:  Hunger.  Feeling worried or nervous (anxious).  Feeling sweaty and cold to the touch (clammy).  Being dizzy or light-headed.  Feeling sleepy.  A fast heartbeat.  Feeling grouchy (irritable).  Tingling or loss of feeling (numbness) around your mouth, lips, or tongue.  Restless sleep. Diabetes medicines can cause low blood sugar. You are more at risk:  While you exercise.  After exercise.  During sleep.  When you are sick.  When you skip meals  or do not eat for a long time. Treating low blood sugar If you think you have low blood sugar, eat or drink something sugary right away. Keep 15 grams of a fast-acting carb (carbohydrate) with you all the time. Make sure your family and friends know how to treat you if you cannot treat yourself. Treating very low blood sugar Severe hypoglycemia is when your blood sugar is at or below 54 mg/dL (3 mmol/L). Severe hypoglycemia is an emergency. Do not wait to see if the symptoms will go away. Get medical help right away. Call your local emergency services (911 in the [place] Do not drive yourself to the hospital. You may need a glucagon shot if you have very low blood sugar and you cannot eat or  drink. Have a family member or friend learn how to check your blood sugar and how to give you a glucagon shot. Ask your doctor if you should have a kit for glucagon shots. Follow these instructions at home: Medicines  Take diabetes medicines as told. If your doctor prescribed insulin or diabetes medicines, take them each day.  Do not run out of insulin or other medicines. Plan ahead.  If you use insulin, change the amount you take based on how active you are and what foods you eat. Your doctor will tell you how to do this.  Take over-the-counter and prescription medicines only as told by your doctor. Eating and drinking  Eat healthy foods. These include: ? Low-fat (lean) proteins. ? Complex carbs, such as whole grains. ? Fresh fruits and vegetables. ? Low-fat dairy products. ? Healthy fats.  Meet with a food expert (dietitian) to make an eating plan.  Follow instructions from your doctor about what you cannot eat or drink.  Drink enough fluid to keep your pee (urine) pale yellow.  Keep track of carbs that you eat. Read food labels and learn serving sizes of foods.  Follow your sick-day plan when you cannot eat or drink as normal. Make this plan with your doctor so it is ready to use.   Activity  Exercise as told by your doctor. You may need to: ? Do stretching and strength exercises 2 or more times a week. ? Do 150 minutes or more of exercise each week that makes your heart beat faster and makes you sweat.  Spread out your exercise over 3 or more days a week.  Do not go more than 2 days in a row without exercise.  Talk with your doctor before you start a new exercise. Your doctor may tell you to change: ? How much insulin or medicines you take. ? How much food you eat. Lifestyle  Do not use any products that contain nicotine or tobacco, such as cigarettes, e-cigarettes, and chewing tobacco. If you need help quitting, ask your doctor.  If you drink alcohol and your doctor  says alcohol is safe for you: ? Limit how much you use to:  0-1 drink a day for women who are not pregnant.  0-2 drinks a day for men. ? Be aware of how much alcohol is in your drink. In the [place] one drink equals one 12 oz bottle of beer (355 mL), one 5 oz glass of wine (148 mL), or one 1 oz glass of hard liquor (44 mL).  Learn to deal with stress. If you need help, ask your doctor. Body care  Stay up to date with your shots (immunizations).  Have your eyes and feet checked by a  doctor as often as told.  Check your skin and feet every day. Check for cuts, bruises, redness, blisters, or sores.  Brush your teeth and gums two times a day. Floss one or more times a day.  Go to the dentist one or more times every 6 months.  Stay at a healthy weight.   General instructions  Share your diabetes care plan with: ? Your work or school. ? People you live with.  Carry a card or wear jewelry that says you have diabetes.  Keep all follow-up visits as told by your doctor. This is important. Questions to ask your doctor  Do I need to meet with a certified expert in diabetes education and care?  Where can I find a support group? Where to find more information  American Diabetes Association: www.diabetes.org  American Association of Diabetes Care and Education Specialists: www.diabeteseducator.org  International Diabetes Federation: MemberVerification.ca Summary  When you have type 2 diabetes, you must make sure your blood sugar (glucose) stays in a healthy range. You can do this with nutrition, exercise, medicines and insulin, and support from doctors and others.  Check your blood sugar every day, or as often as told.  Having diabetes can raise your risk for other long-term health problems. You may get medicines to help prevent these problems.  Share your diabetes management plan with people at work, school, and home.  Keep all follow-up visits as told by your doctor. This is  important. This information is not intended to replace advice given to you by your health care provider. Make sure you discuss any questions you have with your health care provider. Document Revised: 10/11/2019 Document Reviewed: 10/11/2019 Elsevier Patient Education  [place].  [email or web address]">  DASH Eating Plan DASH stands for Dietary Approaches to Stop Hypertension. The DASH eating plan is a healthy eating plan that has been shown to:  Reduce high blood pressure (hypertension).  Reduce your risk for type 2 diabetes, heart disease, and stroke.  Help with weight loss. What are tips for following this plan? Reading food labels  Check food labels for the amount of salt (sodium) per serving. Choose foods with less than 5 percent of the Daily Value of sodium. Generally, foods with less than 300 milligrams (mg) of sodium per serving fit into this eating plan.  To find whole grains, look for the word "whole" as the first word in the ingredient list. Shopping  Buy products labeled as "low-sodium" or "no salt added."  Buy fresh foods. Avoid canned foods and pre-made or frozen meals. Cooking  Avoid adding salt when cooking. Use salt-free seasonings or herbs instead of table salt or sea salt. Check with your health care provider or pharmacist before using salt substitutes.  Do not fry foods. Cook foods using healthy methods such as baking, boiling, grilling, roasting, and broiling instead.  Cook with heart-healthy oils, such as olive, canola, avocado, soybean, or sunflower oil. Meal planning  Eat a balanced diet that includes: ? 4 or more servings of fruits and 4 or more servings of vegetables each day. Try to fill one-half of your plate with fruits and vegetables. ? 6-8 servings of whole grains each day. ? Less than 6 oz (170 g) of lean meat, poultry, or fish each day. A 3-oz (85-g) serving of meat is about the same size as a  deck of cards. One egg equals 1 oz (28 g). ? 2-3 servings of low-fat dairy each day. One serving is 1 cup (237 mL). ?  1 serving of nuts, seeds, or beans 5 times each week. ? 2-3 servings of heart-healthy fats. Healthy fats called omega-3 fatty acids are found in foods such as walnuts, flaxseeds, fortified milks, and eggs. These fats are also found in cold-water fish, such as sardines, salmon, and mackerel.  Limit how much you eat of: ? Canned or prepackaged foods. ? Food that is high in trans fat, such as some fried foods. ? Food that is high in saturated fat, such as fatty meat. ? Desserts and other sweets, sugary drinks, and other foods with added sugar. ? Full-fat dairy products.  Do not salt foods before eating.  Do not eat more than 4 egg yolks a week.  Try to eat at least 2 vegetarian meals a week.  Eat more home-cooked food and less restaurant, buffet, and fast food.   Lifestyle  When eating at a restaurant, ask that your food be prepared with less salt or no salt, if possible.  If you drink alcohol: ? Limit how much you use to:  0-1 drink a day for women who are not pregnant.  0-2 drinks a day for men. ? Be aware of how much alcohol is in your drink. In the [place] one drink equals one 12 oz bottle of beer (355 mL), one 5 oz glass of wine (148 mL), or one 1 oz glass of hard liquor (44 mL). General information  Avoid eating more than 2,300 mg of salt a day. If you have hypertension, you may need to reduce your sodium intake to 1,500 mg a day.  Work with your health care provider to maintain a healthy body weight or to lose weight. Ask what an ideal weight is for you.  Get at least 30 minutes of exercise that causes your heart to beat faster (aerobic exercise) most days of the week. Activities may include walking, swimming, or biking.  Work with your health care provider or dietitian to adjust your eating plan to your individual calorie needs. What foods should I  eat? Fruits All fresh, dried, or frozen fruit. Canned fruit in natural juice (without added sugar). Vegetables Fresh or frozen vegetables (raw, steamed, roasted, or grilled). Low-sodium or reduced-sodium tomato and vegetable juice. Low-sodium or reduced-sodium tomato sauce and tomato paste. Low-sodium or reduced-sodium canned vegetables. Grains Whole-grain or whole-wheat bread. Whole-grain or whole-wheat pasta. Brown rice. [place]. Bulgur. Whole-grain and low-sodium cereals. Pita bread. Low-fat, low-sodium crackers. Whole-wheat flour tortillas. Meats and other proteins Skinless chicken or [place]. Ground chicken or [place]. Pork with fat trimmed off. Fish and seafood. Egg whites. Dried beans, peas, or lentils. Unsalted nuts, nut butters, and seeds. Unsalted canned beans. Lean cuts of beef with fat trimmed off. Low-sodium, lean precooked or cured meat, such as sausages or meat loaves. Dairy Low-fat (1%) or fat-free (skim) milk. Reduced-fat, low-fat, or fat-free cheeses. Nonfat, low-sodium ricotta or cottage cheese. Low-fat or nonfat yogurt. Low-fat, low-sodium cheese. Fats and oils Soft margarine without trans fats. Vegetable oil. Reduced-fat, low-fat, or light mayonnaise and salad dressings (reduced-sodium). Canola, safflower, olive, avocado, soybean, and sunflower oils. Avocado. Seasonings and condiments Herbs. Spices. Seasoning mixes without salt. Other foods Unsalted popcorn and pretzels. Fat-free sweets. The items listed above may not be a complete list of foods and beverages you can eat. Contact a dietitian for more information. What foods should I avoid? Fruits Canned fruit in a light or heavy syrup. Fried fruit. Fruit in cream or butter sauce. Vegetables Creamed or fried vegetables. Vegetables in a cheese  sauce. Regular canned vegetables (not low-sodium or reduced-sodium). Regular canned tomato sauce and paste (not low-sodium or reduced-sodium). Regular tomato and vegetable juice  (not low-sodium or reduced-sodium). Angie Fava. Olives. Grains Baked goods made with fat, such as croissants, muffins, or some breads. Dry pasta or rice meal packs. Meats and other proteins Fatty cuts of meat. Ribs. Fried meat. Berniece Salines. Bologna, salami, and other precooked or cured meats, such as sausages or meat loaves. Fat from the back of a pig (fatback). Bratwurst. Salted nuts and seeds. Canned beans with added salt. Canned or smoked fish. Whole eggs or egg yolks. Chicken or [place] with skin. Dairy Whole or 2% milk, cream, and half-and-half. Whole or full-fat cream cheese. Whole-fat or sweetened yogurt. Full-fat cheese. Nondairy creamers. Whipped toppings. Processed cheese and cheese spreads. Fats and oils Butter. Stick margarine. Lard. Shortening. Ghee. Bacon fat. Tropical oils, such as coconut, palm kernel, or palm oil. Seasonings and condiments Onion salt, garlic salt, seasoned salt, table salt, and sea salt. Worcestershire sauce. Tartar sauce. Barbecue sauce. Teriyaki sauce. Soy sauce, including reduced-sodium. Steak sauce. Canned and packaged gravies. Fish sauce. Oyster sauce. Cocktail sauce. Store-bought horseradish. Ketchup. Mustard. Meat flavorings and tenderizers. Bouillon cubes. Hot sauces. Pre-made or packaged marinades. Pre-made or packaged taco seasonings. Relishes. Regular salad dressings. Other foods Salted popcorn and pretzels. The items listed above may not be a complete list of foods and beverages you should avoid. Contact a dietitian for more information. Where to find more information  National Heart, Lung, and Blood Institute: https://wilson-eaton.com/  American Heart Association: www.heart.org  Academy of Nutrition and Dietetics: www.eatright.[place]: www.kidney.org Summary  The DASH eating plan is a healthy eating plan that has been shown to reduce high blood pressure (hypertension). It may also reduce your risk for type 2 diabetes, heart disease, and  stroke.  When on the DASH eating plan, aim to eat more fresh fruits and vegetables, whole grains, lean proteins, low-fat dairy, and heart-healthy fats.  With the DASH eating plan, you should limit salt (sodium) intake to 2,300 mg a day. If you have hypertension, you may need to reduce your sodium intake to 1,500 mg a day.  Work with your health care provider or dietitian to adjust your eating plan to your individual calorie needs. This information is not intended to replace advice given to you by your health care provider. Make sure you discuss any questions you have with your health care provider. Document Revised: 02/10/2019 Document Reviewed: 02/10/2019 Elsevier Patient Education  2021 [place].  Heart Failure Action Plan A heart failure action plan helps you understand what to do when you have symptoms of heart failure. Your action plan is a color-coded plan that lists the symptoms to watch for and indicates what actions to take.  If you have symptoms in the red zone, you need medical care right away.  If you have symptoms in the yellow zone, you are having problems.  If you have symptoms in the green zone, you are doing well. Follow the plan that was created by you and your health care provider. Review your plan each time you visit your health care provider. Red zone These signs and symptoms mean you should get medical help right away:  You have trouble breathing when resting.  You have a dry cough that is getting worse.  You have swelling or pain in your legs or abdomen that is getting worse.  You suddenly gain more than 2-3 lb (0.9-1.4 kg) in 24 hours,  or more than 5 lb (2.3 kg) in a week. This amount may be more or less depending on your condition.  You have trouble staying awake or you feel confused.  You have chest pain.  You do not have an appetite.  You pass out.  You have worsening sadness or depression. If you have any of these symptoms, call your local  emergency services (911 in the [place] right away. Do not drive yourself to the hospital.   Yellow zone These signs and symptoms mean your condition may be getting worse and you should make some changes:  You have trouble breathing when you are active, or you need to sleep with your head raised on extra pillows to help you breathe.  You have swelling in your legs or abdomen.  You gain 2-3 lb (0.9-1.4 kg) in 24 hours, or 5 lb (2.3 kg) in a week. This amount may be more or less depending on your condition.  You get tired easily.  You have trouble sleeping.  You have a dry cough. If you have any of these symptoms:  Contact your health care provider within the next day.  Your health care provider may adjust your medicines.   Green zone These signs mean you are doing well and can continue what you are doing:  You do not have shortness of breath.  You have very little swelling or no new swelling.  Your weight is stable (no gain or loss).  You have a normal activity level.  You do not have chest pain or any other new symptoms.   Follow these instructions at home:  Take over-the-counter and prescription medicines only as told by your health care provider.  Weigh yourself daily. Your target weight is __________ lb (__________ kg). ? Call your health care provider if you gain more than __________ lb (__________ kg) in 24 hours, or more than __________ lb (__________ kg) in a week. ? Health care provider name: _____________________________________________________ ? Health care provider phone number: _____________________________________________________  Eat a heart-healthy diet. Work with a diet and nutrition specialist (dietitian) to create an eating plan that is best for you.  Keep all follow-up visits. This is important. Where to find more information  American Heart Association: www.heart.org Summary  A heart failure action plan helps you understand what to do when you have  symptoms of heart failure.  Follow the action plan that was created by you and your health care provider.  Get help right away if you have any symptoms in the red zone. This information is not intended to replace advice given to you by your health care provider. Make sure you discuss any questions you have with your health care provider. Document Revised: 10/23/2019 Document Reviewed: 10/23/2019 Elsevier Patient Education  2021 [employer].   The patient verbalized understanding of instructions provided today and agreed to receive a mailed copy of patient instruction and/or educational materials.  Telephone follow up appointment with Managed Medicaid care management team member scheduled for:08/28/20 @ 3:30pm  Latoya Joiner RN, BSN [place] RN Care Coordinator   Following is a copy of your plan of care:  Patient Care Plan: Heart Failure (Adult)    Problem Identified: Symptom Exacerbation (Heart Failure)     Long-Range Goal: Symptom Exacerbation Prevented or Minimized   Start Date: 08/09/2020  Expected End Date: 11/08/2020  This Visit's Progress: On track  Priority: High  Note:   Current Barriers:  [name] Knowledge deficit related to basic  heart failure pathophysiology and self care management-Latoya Walsh is managing her health with the assistance of an Walsh. Today patient was alert, but confused at times and requested RNCM to speak with her Walsh. Latoya Walsh was not at home and was unable to review her medications. She has recently began receiving Paramedic support for medication management. Latoya Walsh does not have a scale, glucometer or BP machine for health management. . Knowledge Deficits related to heart failure medications . Patient does not have readable scale . Literacy Barriers . Financial strain . Cognitive Deficits . Unable to self administer medications as prescribed . Does not adhere to provider recommendations re:  [name] Does not contact provider  office for questions/concerns Case Manager Clinical Goal(s):  . patient will weigh self daily and record . patient will verbalize understanding of Heart Failure Action Plan and when to call doctor . patient will take all Heart Failure mediations as prescribed . patient will weigh daily and record (notifying MD of 3 lb weight gain over night or 5 lb in a week) Interventions:  . Collaborated with PCP for scale and BP machine . Collaborated with [facility] pharmacist, Latoya Walsh for medication management . Basic overview and discussion of pathophysiology of Heart Failure reviewed  . Provided verbal education on low sodium diet . Provided written education on low sodium diet . Assessed need for readable accurate scales in home . Discussed importance of daily weight and advised patient to weigh and record daily Patient Goals/Self-Care Activities - eat more whole grains, fruits and vegetables, lean meats and healthy fats - know when to call the doctor - track symptoms and what helps feel better or worse - dress right for the weather, hot or cold  - call office if I gain more than 2 pounds in one day or 5 pounds in one week - use salt in moderation - watch for swelling in feet, ankles and legs every day - weigh myself daily  Follow Up Plan: Telephone follow up appointment with care management team member scheduled for:08/28/20 @ 3:30pm    Patient Care Plan: Diabetes Type 2 (Adult)    Problem Identified: Glycemic Management (Diabetes, Type 2)     Long-Range Goal: Glycemic Management Optimized   Start Date: 08/09/2020  Expected End Date: 11/08/2020  This Visit's Progress: On track  Priority: Medium  Note:   Objective:  Lab Results  Component Value Date   HGBA1C 7.7 (H) 06/07/2020 .   Lab Results  Component Value Date   CREATININE 2.81 (H) 06/10/2020   CREATININE 2.53 (H) 06/09/2020   CREATININE 2.54 (H) 06/08/2020 .   [name] No results found for: EGFR Current Barriers:  [name] Knowledge Deficits  related to basic Diabetes pathophysiology and self care/management . Knowledge Deficits related to medications used for management of diabetes . Difficulty obtaining or cannot afford medications . Does not have glucometer to monitor blood sugar . Film/video editor . Cognitive Deficits . Unable to self administer medications as prescribed . Does not contact provider office for questions/concerns Case Manager Clinical Goal(s):  . patient will demonstrate improved adherence to prescribed treatment plan for diabetes self care/management as evidenced by: daily monitoring and recording of CBG,  adherence to ADA/ carb modified diet, adherence to prescribed medication regimen, contacting provider for new or worsened symptoms or questions Interventions:  . Inter-disciplinary care team collaboration (see longitudinal plan of care) . Provided education to patient about basic DM disease process . Discussed plans with patient for ongoing care management follow up and  provided patient with direct contact information for care management team . Provided patient with written educational materials related to hypo and hyperglycemia and importance of correct treatment . Reviewed scheduled/upcoming provider appointments  . Referral made to pharmacy team for assistance with medication management . Review of patient status, including review of consultants reports, relevant laboratory and other test results, and medications completed.  Self-Care Activities - Self administers oral medications as prescribed Attends all scheduled provider appointments Checks blood sugars as prescribed and utilize hyper and hypoglycemia protocol as needed Adheres to prescribed ADA/carb modified Patient Goals: - adhere to a diabetic diet - check blood sugar at prescribed times - check blood sugar if I feel it is too high or too low - enter blood sugar readings and medication or insulin into daily log - take the blood sugar log  to all doctor visits - take the blood sugar meter to all doctor visits  Follow Up Plan: Telephone follow up appointment with care management team member scheduled for:08/28/20 @ 3:30pm

## 2020-08-09 NOTE — Telephone Encounter (Signed)
[name], community paramedic [facility] called me back. She has been able to make contact w/ Ms. Jeune and went to her home for an initial visit. She shares that pt was out of several medications, she has assisted pt with calling to request those refills. She will go back out and assist pt with setting up pill box as pt also did not have one (LCSW had sent one to the [facility] which apparently had not made it's way to the pt). Shared I appreciate [name]'s assistance and remain available for any additional questions/concerns.   Westley Hummer, MSW, [place]  [phone]

## 2020-08-09 NOTE — Patient Outreach (Signed)
Medicaid Managed Care   Nurse Care Manager Note  08/09/2020 Name:  Latoya Walsh MRN:  [id number] DOB:  16-Aug-1960  Latoya Walsh is an 60 y.o. year old female who is a primary patient of [name], MD.  [facility] Managed Care Coordination team was consulted for assistance with:    CHF DMII  [name] was given information about Medicaid Managed Care Coordination team services today. [name] agreed to services and verbal consent obtained.  Engaged with patient by telephone for initial visit in response to provider referral for case management and/or care coordination services.   Assessments/Interventions:  Review of past medical history, allergies, medications, health status, including review of consultants reports, laboratory and other test data, was performed as part of comprehensive evaluation and provision of chronic care management services.  SDOH (Social Determinants of Health) assessments and interventions performed:   Care Plan   Unable to review medications today-Patient did not have list with her     Patient Active Problem List   Diagnosis Date Noted  . Acute CHF (congestive heart failure) ([place]) 06/06/2020  . Leg swelling 04/28/2020  . Hyperlipidemia LDL goal <100 04/28/2020  . Left hand pain 03/10/2020  . Anemia 02/19/2020  . Cough   . Acute renal failure superimposed on stage 3b chronic kidney disease ([place]) 01/17/2020  . COVID-19 virus infection 12/26/2019  . Counseled about COVID-19 virus infection 10/06/2019  . Depression, major, single episode, in partial remission ([place]) 01/25/2019  . Headache 11/13/2018  . Insomnia 09/25/2018  . Screening for colorectal cancer 07/23/2016  . Lumbar back pain with radiculopathy affecting right lower extremity 03/27/2015  . Non compliance with medical treatment 07/22/2014  . Chronic hepatitis C without hepatic coma ([place]) 12/04/2013  . Allergic rhinitis 11/03/2012  . Severe hypertension 06/25/2011   . Anxiety and depression 02/05/2011  . Nicotine dependence 11/01/2010  . Drug dependence, continuous abuse ([place]) 03/22/2010  . Controlled type 2 diabetes with neuropathy ([place]) 06/02/2007  . Alcohol abuse 06/02/2007    Conditions to be addressed/monitored per PCP order:  CHF and DMII  Care Plan : Heart Failure (Adult)  Updates made by Fatumata Montane, RN since 08/09/2020 12:00 AM    Problem: Symptom Exacerbation (Heart Failure)     Long-Range Goal: Symptom Exacerbation Prevented or Minimized   Start Date: 08/09/2020  Expected End Date: 11/08/2020  This Visit's Progress: On track  Priority: High  Note:   Current Barriers:  [name] Knowledge deficit related to basic heart failure pathophysiology and self care management-[name] is managing her health with the assistance of an Aide. Today patient was alert, but confused at times and requested RNCM to speak with her Aide. [name] was not at home and was unable to review her medications. She has recently began receiving Paramedic support for medication management. [name] does not have a scale, glucometer or BP machine for health management. . Knowledge Deficits related to heart failure medications . Patient does not have readable scale . Literacy Barriers . Financial strain . Cognitive Deficits . Unable to self administer medications as prescribed . Does not adhere to provider recommendations re:  [name] Does not contact provider office for questions/concerns Case Manager Clinical Goal(s):  . patient will weigh self daily and record . patient will verbalize understanding of Heart Failure Action Plan and when to call doctor . patient will take all Heart Failure mediations as prescribed . patient will weigh daily and record (notifying MD of 3 lb  weight gain over night or 5 lb in a week) Interventions:  . Collaborated with PCP for scale and BP machine . Collaborated with [facility] pharmacist, Ovid Curd for medication management . Basic overview and  discussion of pathophysiology of Heart Failure reviewed  . Provided verbal education on low sodium diet . Provided written education on low sodium diet . Assessed need for readable accurate scales in home . Discussed importance of daily weight and advised patient to weigh and record daily Patient Goals/Self-Care Activities - eat more whole grains, fruits and vegetables, lean meats and healthy fats - know when to call the doctor - track symptoms and what helps feel better or worse - dress right for the weather, hot or cold  - call office if I gain more than 2 pounds in one day or 5 pounds in one week - use salt in moderation - watch for swelling in feet, ankles and legs every day - weigh myself daily  Follow Up Plan: Telephone follow up appointment with care management team member scheduled for:08/28/20 @ 3:30pm    Care Plan : Diabetes Type 2 (Adult)  Updates made by [name], RN since 08/09/2020 12:00 AM    Problem: Glycemic Management (Diabetes, Type 2)     Long-Range Goal: Glycemic Management Optimized   Start Date: 08/09/2020  Expected End Date: 11/08/2020  This Visit's Progress: On track  Priority: Medium  Note:   Objective:  Lab Results  Component Value Date   HGBA1C 7.7 (H) 06/07/2020 .   Lab Results  Component Value Date   CREATININE 2.81 (H) 06/10/2020   CREATININE 2.53 (H) 06/09/2020   CREATININE 2.54 (H) 06/08/2020 .   [name] No results found for: EGFR Current Barriers:  [name] Knowledge Deficits related to basic Diabetes pathophysiology and self care/management . Knowledge Deficits related to medications used for management of diabetes . Difficulty obtaining or cannot afford medications . Does not have glucometer to monitor blood sugar . Film/video editor . Cognitive Deficits . Unable to self administer medications as prescribed . Does not contact provider office for questions/concerns Case Manager Clinical Goal(s):  . patient will demonstrate improved  adherence to prescribed treatment plan for diabetes self care/management as evidenced by: daily monitoring and recording of CBG,  adherence to ADA/ carb modified diet, adherence to prescribed medication regimen, contacting provider for new or worsened symptoms or questions Interventions:  . Inter-disciplinary care team collaboration (see longitudinal plan of care) . Provided education to patient about basic DM disease process . Discussed plans with patient for ongoing care management follow up and provided patient with direct contact information for care management team . Provided patient with written educational materials related to hypo and hyperglycemia and importance of correct treatment . Reviewed scheduled/upcoming provider appointments  . Referral made to pharmacy team for assistance with medication management . Review of patient status, including review of consultants reports, relevant laboratory and other test results, and medications completed.  Self-Care Activities - Self administers oral medications as prescribed Attends all scheduled provider appointments Checks blood sugars as prescribed and utilize hyper and hypoglycemia protocol as needed Adheres to prescribed ADA/carb modified Patient Goals: - adhere to a diabetic diet - check blood sugar at prescribed times - check blood sugar if I feel it is too high or too low - enter blood sugar readings and medication or insulin into daily log - take the blood sugar log to all doctor visits - take the blood sugar meter to all doctor visits  Follow  Up Plan: Telephone follow up appointment with care management team member scheduled for:08/28/20 @ 3:30pm      Follow Up:  Patient agrees to Care Plan and Follow-up.  Plan: The Managed Medicaid care management team will reach out to the patient again over the next 20 days.  Date/time of next scheduled RN care management/care coordination outreach: 08/28/20 @ 3:30pm  [name] RN,  [place] RN Care Coordinator

## 2020-08-12 ENCOUNTER — Other Ambulatory Visit: Payer: Self-pay | Admitting: Family Medicine

## 2020-08-12 ENCOUNTER — Telehealth: Payer: Self-pay

## 2020-08-12 NOTE — Telephone Encounter (Signed)
The aide is call to see if we can request additional hours for the pt

## 2020-08-13 ENCOUNTER — Telehealth: Payer: Self-pay | Admitting: Licensed Clinical Social Worker

## 2020-08-13 NOTE — Telephone Encounter (Signed)
[employer] called this Probation officer with several questions/concerns. Pt having difficulty managing her medications even with the assistance of paramedic- much of this confusion is also around what pt should be taking and when. [name] willing to work with pt and educate her on this as well once it has been assessed what pt is prescribed to be taking/when to take them.  [name] also shared that pt mentioned this Probation officer was to be providing a scale and bp cuff. LCSW shared that I had not discussed that with pt, that the [facility] office can get the scale but that perhaps the [facility] RN that had called pt had mentioned that during their conversation.   LCSW let paramedic know that I would reach out to [facility] and [place] office to assess what had been discussed and to see who could review medications with pt.   Westley Hummer, MSW, [place]  [phone]

## 2020-08-13 NOTE — Telephone Encounter (Signed)
Was able to confirm with [name], [facility] RN, that they will continue to provide telehealth support to pt. Their pharmacy team Ovid Curd) was given [name] number to connect and clarify meds w/ paramedic.   I was then able to confirm [facility] team had requested glucometer, scale, BP cuff from PCP. LCSW shared that pt has received pill box and glucometer. Offered to connect pt with scale and BP cuff from [facility] office. Threasa Beards [facility] RN is interested in this assistance if able.   LCSW reached out to Christella Scheuermann, [place], who will request office order scale and cuff for pt.   LCSW communicated the above back to [place] this afternoon. She will f/u with me by Thursday if she hasn't heard from [facility] pharmacist so we can expeditiously get her medications sorted out appropriately.   Westley Hummer, MSW, [place]  [phone]

## 2020-08-13 NOTE — Telephone Encounter (Signed)
Will complete a new pcs form requesting additional hours

## 2020-08-13 NOTE — Telephone Encounter (Signed)
DPR COMPLETED

## 2020-08-16 ENCOUNTER — Telehealth: Payer: Self-pay | Admitting: Student

## 2020-08-16 NOTE — Telephone Encounter (Signed)
medlist faxed to [phone]

## 2020-08-16 NOTE — Telephone Encounter (Signed)
Latoya Walsh at [facility](Community EMT) got referral from [facility] called. She needs to confirm meds that [name] takes/dosage. She has been seen by Dr. Cecilie Lowers. She can be reached at   [phone].

## 2020-08-21 ENCOUNTER — Other Ambulatory Visit: Payer: Self-pay

## 2020-08-21 NOTE — Patient Outreach (Signed)
Medicaid Managed Care    Pharmacy Note  08/21/2020 Name: Latoya Walsh MRN: [id number] DOB: [birth year]  Latoya Walsh is a 60 y.o. year old female who is a primary care patient of [name] Norwood Levo, MD. The [facility] Managed Care Coordination team was consulted for assistance with disease management and care coordination needs.    Engaged with patient Engaged with patient by telephone for initial visit in response to referral for case management and/or care coordination services.  Ms. Mariconda was given information about Managed Medicaid Care Coordination team services today. Latoya Walsh agreed to services and verbal consent obtained.   Objective:  Lab Results  Component Value Date   CREATININE 2.81 (H) 06/10/2020   CREATININE 2.53 (H) 06/09/2020   CREATININE 2.54 (H) 06/08/2020    Lab Results  Component Value Date   HGBA1C 7.7 (H) 06/07/2020       Component Value Date/Time   CHOL 167 05/02/2020 1535   TRIG 277 (H) 05/02/2020 1535   HDL 34 (L) 05/02/2020 1535   CHOLHDL 4.9 (H) 05/02/2020 1535   CHOLHDL 4.8 03/28/2019 1056   VLDL 14 10/02/2016 0821   LDLCALC 87 05/02/2020 1535   LDLCALC 136 (H) 03/28/2019 1056    Other: (TSH, CBC, Vit D, etc.)  Clinical ASCVD: Yes  The 10-year ASCVD risk score Mikey Bussing DC Jr., et al., 2013) is: 33.3%   Values used to calculate the score:     Age: 20 years     Sex: Female     Is Non-Hispanic African American: Yes     Diabetic: Yes     Tobacco smoker: Yes     Systolic Blood Pressure: [id number] mmHg     Is BP treated: Yes     HDL Cholesterol: 34 mg/dL     Total Cholesterol: 167 mg/dL    Other: (CHADS2VASc if Afib, PHQ9 if depression, MMRC or CAT for COPD, ACT, DEXA)  BP Readings from Last 3 Encounters:  07/25/20 134/80  07/18/20 130/84  06/26/20 (!) 148/88    Assessment/Interventions: Review of patient past medical history, allergies, medications, health status, including review of consultants reports, laboratory and other  test data, was performed as part of comprehensive evaluation and provision of chronic care management services.   Medications -Unable to go over medications today. Patient was very brusk and quick. There was a loud TV on in the background patient refused to turn down. Stated, "I don't need to talk, I just want you to bubble pack my medications. Plan: Used fill Hx to get list of medications. Verbal consent obtained for [facility] enhanced pharmacy services (medication synchronization, adherence packaging, delivery coordination). A medication sync plan was created to allow patient to get all medications delivered once every 30 to 90 days per patient preference. Patient understands they have freedom to choose pharmacy and clinical pharmacist will coordinate care between all prescribers and [facility].    SDOH (Social Determinants of Health) assessments and interventions performed:    Care Plan  Allergies  Allergen Reactions  . Penicillins Itching and Rash    Medications Reviewed Today    Reviewed by Lane Hacker, [facility] (Pharmacist) on 08/21/20 at 1501  Med List Status: <None>  Medication Order Taking? Sig Documenting Provider Last Dose Status Informant  amLODipine (NORVASC) 5 MG tablet [id number] Yes TAKE ONE TABLET BY MOUTH ONCE DAILY. [name], MD Taking Active   ergocalciferol (VITAMIN D2) 1.25 MG (50000 UT) capsule [id number] Yes Take 1 capsule (50,000 Units total)  by mouth once a week. One capsule once weekly  Patient taking differently: Take 50,000 Units by mouth every Monday. One capsule once weekly   [name], MD Taking Active            Med Note Angela Adam, [place] Constable Feb 19, 2020  9:55 AM)    fluticasone Asencion Islam) 50 MCG/ACT nasal spray [id number]  Place 1 spray into both nostrils daily. [name], MD  Active Self  furosemide (LASIX) 40 MG tablet [id number] Yes Take 1 tablet (40 mg total) by mouth 2 (two) times daily. [name], MD  Taking Active   glipiZIDE (GLUCOTROL XL) 10 MG 24 hr tablet [id number] No Take 1 tablet (10 mg total) by mouth daily with breakfast.  Patient not taking: Reported on 08/21/2020   [name], MD Not Taking Consider Medication Status and Discontinue Self  glipiZIDE (GLUCOTROL XL) 5 MG 24 hr tablet [id number] Yes TAKE 1 TABLET BY MOUTH DAILY WITH BREAKFAST. [name], MD Taking Active   hydrALAZINE (APRESOLINE) 50 MG tablet [id number] Yes Take 1 tablet (50 mg total) by mouth every 8 (eight) hours. [name], MD Taking Active   isosorbide mononitrate (IMDUR) 30 MG 24 hr tablet [id number] Yes Take 1 tablet (30 mg total) by mouth daily. [name], MD Taking Active   losartan (COZAAR) 25 MG tablet [id number] Yes Take 25 mg by mouth daily. [provider] Taking Active   metoprolol succinate (TOPROL-XL) 50 MG 24 hr tablet [id number] Yes Take 1 tablet (50 mg total) by mouth daily. Take with or immediately following a meal. Branch, [name], MD Taking Active   mirtazapine (REMERON) 15 MG tablet [id number] Yes Take 15 mg by mouth at bedtime. [provider] Taking Active   rosuvastatin (CRESTOR) 10 MG tablet [id number] Yes Take 1 tablet (10 mg total) by mouth daily. [name], MD Taking Active Self  sodium bicarbonate 650 MG tablet [id number] Yes Take 650 mg by mouth 2 (two) times daily. [provider] Taking Active   Med List Note [name], [place] [id number]):            Patient Active Problem List   Diagnosis Date Noted  . Acute CHF (congestive heart failure) ([place]) 06/06/2020  . Leg swelling 04/28/2020  . Hyperlipidemia LDL goal <100 04/28/2020  . Left hand pain 03/10/2020  . Anemia 02/19/2020  . Cough   . Acute renal failure superimposed on stage 3b chronic kidney disease ([place]) 01/17/2020  . COVID-19 virus infection 12/26/2019  . Counseled about COVID-19 virus infection 10/06/2019  . Depression, major, single episode, in  partial remission ([place]) 01/25/2019  . Headache 11/13/2018  . Insomnia 09/25/2018  . Screening for colorectal cancer 07/23/2016  . Lumbar back pain with radiculopathy affecting right lower extremity 03/27/2015  . Non compliance with medical treatment 07/22/2014  . Chronic hepatitis C without hepatic coma ([place]) 12/04/2013  . Allergic rhinitis 11/03/2012  . Severe hypertension 06/25/2011  . Anxiety and depression 02/05/2011  . Nicotine dependence 11/01/2010  . Drug dependence, continuous abuse ([place]) 03/22/2010  . Controlled type 2 diabetes with neuropathy ([place]) 06/02/2007  . Alcohol abuse 06/02/2007    Conditions to be addressed/monitored: HTN and DM  Care Plan : Medication management  Updates made by Lane Hacker, [place] since 08/21/2020 12:00 AM    Problem: Health Promotion or Disease Self-Management (General Plan of Care)     Goal: Medication Management   Note:  Current Barriers:  . Unable to self administer medications as prescribed . Does not adhere to prescribed medication regimen . Does not maintain contact with provider office . Does not contact provider office for questions/concerns .   Pharmacist Clinical Goal(s):  [name] Over the next 30 days, patient will contact provider office for questions/concerns as evidenced notation of same in electronic health record through collaboration with PharmD and provider.  .   Interventions: . Inter-disciplinary care team collaboration (see longitudinal plan of care) . Comprehensive medication review performed; medication list updated in electronic medical record  '@RXCPDIABETES'$ @ '@RXCPHYPERTENSION'$ @ '@RXCPHYPERLIPIDEMIA'$ @  Patient Goals/Self-Care Activities . Over the next 30 days, patient will:  - collaborate with provider on medication access solutions  Follow Up Plan: The care management team will reach out to the patient again over the next 30 days.     Task: Mutually Develop and Royce Macadamia Achievement of Patient Goals   Note:    Care Management Activities:    - verbalization of feelings encouraged    Notes:      Medication Assistance: Utilizing Enhanced Pharmacy Services (Packaging)  Follow up: Agree  Plan: The care management team will reach out to the patient again over the next 30 days.   [place] Constable, Pharm.D., Managed Medicaid Pharmacist - [phone]

## 2020-08-21 NOTE — Patient Instructions (Signed)
Visit Information  Latoya Walsh was given information about Medicaid Managed Care team care coordination services as a part of their [place] Medicaid benefit. Latoya Walsh verbally consented to engagement with the [facility] Managed Care team.   For questions related to your [facility], please call: [phone] or visit the homepage here: [email or web address]  If you would like to schedule transportation through your [facility], please call the following number at least 2 days in advance of your appointment: [phone].   Call the [place] at [phone], at any time, 24 hours a day, 7 days a week. If you are in danger or need immediate medical attention call 911.  Latoya Walsh - following are the goals we discussed in your visit today:  Goals Addressed   None     Please see education materials related to DM provided as print materials.   Patient verbalizes understanding of instructions provided today.   The Managed Medicaid care management team will reach out to the patient again over the next 30 days.   Latoya Walsh, Pharm.D., Managed Medicaid Pharmacist [phone]   Following is a copy of your plan of care:  Patient Care Plan: Heart Failure (Adult)    Problem Identified: Symptom Exacerbation (Heart Failure)     Long-Range Goal: Symptom Exacerbation Prevented or Minimized   Start Date: 08/09/2020  Expected End Date: 11/08/2020  This Visit's Progress: On track  Priority: High  Note:   Current Barriers:  [name] Knowledge deficit related to basic heart failure pathophysiology and self care management-Latoya Walsh is managing her health with the assistance of an Aide. Today patient was alert, but confused at times and requested RNCM to speak with her Aide. Latoya Walsh was not at home and was unable to review her medications. She has  recently began receiving Paramedic support for medication management. Latoya Walsh does not have a scale, glucometer or BP machine for health management. . Knowledge Deficits related to heart failure medications . Patient does not have readable scale . Literacy Barriers . Financial strain . Cognitive Deficits . Unable to self administer medications as prescribed . Does not adhere to provider recommendations re:  [name] Does not contact provider office for questions/concerns Case Manager Clinical Goal(s):  . patient will weigh self daily and record . patient will verbalize understanding of Heart Failure Action Plan and when to call doctor . patient will take all Heart Failure mediations as prescribed . patient will weigh daily and record (notifying MD of 3 lb weight gain over night or 5 lb in a week) Interventions:  . Collaborated with PCP for scale and BP machine . Collaborated with [facility] pharmacist, Ovid Curd for medication management . Basic overview and discussion of pathophysiology of Heart Failure reviewed  . Provided verbal education on low sodium diet . Provided written education on low sodium diet . Assessed need for readable accurate scales in home . Discussed importance of daily weight and advised patient to weigh and record daily Patient Goals/Self-Care Activities - eat more whole grains, fruits and vegetables, lean meats and healthy fats - know when to call the doctor - track symptoms and what helps feel better or worse - dress right for the weather, hot or cold  - call office if I gain more than 2 pounds in one day or 5 pounds in one week - use salt in moderation - watch for swelling in feet, ankles and legs every day -  weigh myself daily  Follow Up Plan: Telephone follow up appointment with care management team member scheduled for:08/28/20 @ 3:30pm    Patient Care Plan: Diabetes Type 2 (Adult)    Problem Identified: Glycemic Management (Diabetes, Type 2)     Long-Range Goal:  Glycemic Management Optimized   Start Date: 08/09/2020  Expected End Date: 11/08/2020  This Visit's Progress: On track  Priority: Medium  Note:   Objective:  Lab Results  Component Value Date   HGBA1C 7.7 (H) 06/07/2020 .   Lab Results  Component Value Date   CREATININE 2.81 (H) 06/10/2020   CREATININE 2.53 (H) 06/09/2020   CREATININE 2.54 (H) 06/08/2020 .   [name] No results found for: EGFR Current Barriers:  [name] Knowledge Deficits related to basic Diabetes pathophysiology and self care/management . Knowledge Deficits related to medications used for management of diabetes . Difficulty obtaining or cannot afford medications . Does not have glucometer to monitor blood sugar . Film/video editor . Cognitive Deficits . Unable to self administer medications as prescribed . Does not contact provider office for questions/concerns Case Manager Clinical Goal(s):  . patient will demonstrate improved adherence to prescribed treatment plan for diabetes self care/management as evidenced by: daily monitoring and recording of CBG,  adherence to ADA/ carb modified diet, adherence to prescribed medication regimen, contacting provider for new or worsened symptoms or questions Interventions:  . Inter-disciplinary care team collaboration (see longitudinal plan of care) . Provided education to patient about basic DM disease process . Discussed plans with patient for ongoing care management follow up and provided patient with direct contact information for care management team . Provided patient with written educational materials related to hypo and hyperglycemia and importance of correct treatment . Reviewed scheduled/upcoming provider appointments  . Referral made to pharmacy team for assistance with medication management . Review of patient status, including review of consultants reports, relevant laboratory and other test results, and medications completed.  Self-Care Activities - Self administers  oral medications as prescribed Attends all scheduled provider appointments Checks blood sugars as prescribed and utilize hyper and hypoglycemia protocol as needed Adheres to prescribed ADA/carb modified Patient Goals: - adhere to a diabetic diet - check blood sugar at prescribed times - check blood sugar if I feel it is too high or too low - enter blood sugar readings and medication or insulin into daily log - take the blood sugar log to all doctor visits - take the blood sugar meter to all doctor visits  Follow Up Plan: Telephone follow up appointment with care management team member scheduled for:08/28/20 @ 3:30pm

## 2020-08-22 ENCOUNTER — Telehealth: Payer: Self-pay

## 2020-08-22 DIAGNOSIS — N189 Chronic kidney disease, unspecified: Secondary | ICD-10-CM | POA: Diagnosis not present

## 2020-08-22 DIAGNOSIS — R808 Other proteinuria: Secondary | ICD-10-CM | POA: Diagnosis not present

## 2020-08-22 DIAGNOSIS — N041 Nephrotic syndrome with focal and segmental glomerular lesions: Secondary | ICD-10-CM | POA: Diagnosis not present

## 2020-08-22 DIAGNOSIS — I129 Hypertensive chronic kidney disease with stage 1 through stage 4 chronic kidney disease, or unspecified chronic kidney disease: Secondary | ICD-10-CM | POA: Diagnosis not present

## 2020-08-22 DIAGNOSIS — E211 Secondary hyperparathyroidism, not elsewhere classified: Secondary | ICD-10-CM | POA: Diagnosis not present

## 2020-08-22 DIAGNOSIS — E1122 Type 2 diabetes mellitus with diabetic chronic kidney disease: Secondary | ICD-10-CM | POA: Diagnosis not present

## 2020-08-22 MED ORDER — LOSARTAN POTASSIUM 25 MG PO TABS: 25.0000 mg | ORAL_TABLET | Freq: Every day | ORAL | 3 refills | Status: DC

## 2020-08-22 MED ORDER — AMLODIPINE BESYLATE 5 MG PO TABS: 1.0000 | ORAL_TABLET | Freq: Every day | ORAL | 3 refills | Status: DC

## 2020-08-22 MED ORDER — ROSUVASTATIN CALCIUM 10 MG PO TABS: 10.0000 mg | ORAL_TABLET | Freq: Every day | ORAL | 3 refills | Status: DC

## 2020-08-22 MED ORDER — ISOSORBIDE MONONITRATE ER 30 MG PO TB24
30.0000 mg | ORAL_TABLET | Freq: Every day | ORAL | 3 refills | Status: DC
Start: 2020-08-22 — End: 2021-08-14

## 2020-08-22 MED ORDER — METOPROLOL SUCCINATE ER 50 MG PO TB24: 50.0000 mg | ORAL_TABLET | Freq: Every day | ORAL | 3 refills | Status: DC

## 2020-08-22 MED ORDER — FUROSEMIDE 40 MG PO TABS: 40.0000 mg | ORAL_TABLET | Freq: Two times a day (BID) | ORAL | 3 refills | Status: DC

## 2020-08-22 NOTE — Telephone Encounter (Signed)
Medication refill request approved and sent to [facility]

## 2020-08-28 ENCOUNTER — Other Ambulatory Visit: Payer: Self-pay | Admitting: *Deleted

## 2020-08-28 NOTE — Patient Outreach (Signed)
Care Coordination  08/28/2020  Latoya Walsh 1960/05/06 [id number]    Medicaid Managed Care   Unsuccessful Outreach Note  08/28/2020 Name: Latoya Walsh MRN: [id number] DOB: Apr 26, 1960  Referred by: [name], MD Reason for referral : High Risk Managed Medicaid (Unsuccessful RNCM follow up outreach)   An unsuccessful telephone outreach was attempted today. The patient was referred to the case management team for assistance with care management and care coordination.   Follow Up Plan: The care management team will reach out to the patient again over the next 14 days.   Latoya Joiner RN, BSN [place]  [employer] manager

## 2020-08-28 NOTE — Patient Instructions (Signed)
Visit Information  [name]  - as a part of your Medicaid benefit, you are eligible for care management and care coordination services at no cost or copay. I was unable to reach you by phone today but would be happy to help you with your health related needs. Please feel free to call me @ [phone] or to reschedule call [name] at [phone].   A member of the Managed Medicaid care management team will reach out to you again over the next 7-14 days on 09/04/20 @ 1pm.   [name] RN, BSN [place] RN Care Coordinator

## 2020-09-02 ENCOUNTER — Telehealth: Payer: Self-pay

## 2020-09-02 NOTE — Telephone Encounter (Signed)
Called [name] and lvm for her to call me back but in meantime pt will also need an ov in next day or so to address her elevated blood sugars. When [name] calls back you can transfer her to me

## 2020-09-02 NOTE — Telephone Encounter (Signed)
Community paramedic --is calling to advise that the sugar is running over 200+  Please call [facility]  [phone]

## 2020-09-04 ENCOUNTER — Other Ambulatory Visit: Payer: Self-pay

## 2020-09-04 ENCOUNTER — Other Ambulatory Visit: Payer: Self-pay | Admitting: *Deleted

## 2020-09-04 NOTE — Telephone Encounter (Signed)
Appt scheduled for 09-05-20 with Pearline Cables NP

## 2020-09-04 NOTE — Patient Instructions (Signed)
Visit Information  Ms. Kleman was given information about Medicaid Managed Care team care coordination services as a part of their [place] Medicaid benefit. [name] verbally consented to engagement with the [facility] Managed Care team.   For questions related to your [facility], please call: [phone] or visit the homepage here: [email or web address]  If you would like to schedule transportation through your [facility], please call the following number at least 2 days in advance of your appointment: [phone].   Call the [place] at [phone], at any time, 24 hours a day, 7 days a week. If you are in danger or need immediate medical attention call 911.  Ms. Deike - following are the goals we discussed in your visit today:   Goals Addressed             This Visit's Progress    Monitor and Manage My Blood Sugar-Diabetes Type 2       Timeframe:  Long-Range Goal Priority:  Medium Start Date:  08/09/20                           Expected End Date:  11/09/20                     Follow Up Date  09/18/20   - adhere to a diabetic diet, eat three meals a day, including protein with each meal - check blood sugar at prescribed times - check blood sugar if I feel it is too high or too low - enter blood sugar readings and medication or insulin into daily log - take the blood sugar log to all doctor visits - take the blood sugar meter to all doctor visits    Why is this important?   Checking your blood sugar at home helps to keep it from getting very high or very low.  Writing the results in a diary or log helps the doctor know how to care for you.  Your blood sugar log should have the time, date and the results.  Also, write down the amount of insulin or other medicine that you take.  Other information, like what you ate, exercise  done and how you were feeling, will also be helpful.           Track and Manage Fluids and Swelling-Heart Failure       Timeframe:  Long-Range Goal Priority:  High Start Date:    08/09/20                         Expected End Date:    11/09/20                   Follow Up Date 09/18/20   - call office if I gain more than 2 pounds in one day or 5 pounds in one week - use salt in moderation - watch for swelling in feet, ankles and legs every day - weigh myself daily    Why is this important?   It is important to check your weight daily and watch how much salt and liquids you have.  It will help you to manage your heart failure.          Track and Manage Symptoms-Heart Failure       Timeframe:  Long-Range Goal Priority:  High Start Date:  08/09/20                          Expected End Date:    11/09/20                   Follow Up Date 09/18/20    - replace chips with a healthy snack like nuts - eat more whole grains, fruits and vegetables, lean meats and healthy fats - know when to call the doctor - track symptoms and what helps feel better or worse - dress right for the weather, hot or cold    Why is this important?   You will be able to handle your symptoms better if you keep track of them.  Making some simple changes to your lifestyle will help.  Eating healthy is one thing you can do to take good care of yourself.             Please see education materials related to diabetes and CHF provided as print materials.   The patient verbalized understanding of instructions provided today and agreed to receive a mailed copy of patient instruction and/or educational materials.  Telephone follow up appointment with Managed Medicaid care management team member scheduled for:09/18/20 @ [place] RN, [place] RN Care Coordinator   Following is a copy of your plan of care:  Patient Care Plan: Heart Failure (Adult)     Problem  Identified: Symptom Exacerbation (Heart Failure)      Long-Range Goal: Symptom Exacerbation Prevented or Minimized   Start Date: 08/09/2020  Expected End Date: 11/08/2020  Recent Progress: On track  Priority: High  Note:   Current Barriers:  Knowledge deficit related to basic heart failure pathophysiology and self care management-Ms. Bellissimo is managing her health with the assistance of an Aide. Today patient was alert, but confused at times and requested RNCM to speak with her Aide. Ms. Tahir was not at home and was unable to review her medications. She has recently began receiving Paramedic support for medication management. Ms. Frame does not have a scale, glucometer or BP machine for health management.-Update-[name] is unsure of medications. She does report taking all of her medications as directed(prepared by [name]). She is working with [facility] Pharmacist, Ovid Curd for medication management Knowledge Deficits related to heart failure medications Patient does not have readable scale Literacy Barriers Financial strain Cognitive Deficits Unable to self administer medications as prescribed Does not adhere to provider recommendations re:  Does not contact provider office for questions/concerns Case Manager Clinical Goal(s):  patient will weigh self daily and record patient will verbalize understanding of Heart Failure Action Plan and when to call doctor patient will take all Heart Failure mediations as prescribed patient will weigh daily and record (notifying MD of 3 lb weight gain over night or 5 lb in a week) Interventions:  Discussed upcoming appointment on 6/22 @ [facility] Basic overview and discussion of pathophysiology of Heart Failure reviewed  Provided verbal education on low sodium diet Provided written education on low sodium diet Discussed importance of daily weight and advised patient to weigh and record daily Patient Goals/Self-Care Activities - attend scheduled  appointment at the [facility] on 6/22 - replace chips with a healthy snack like nuts - eat more whole grains, fruits and vegetables, lean meats and healthy fats - know when to call the doctor - track symptoms and what helps feel better  or worse - dress right for the weather, hot or cold  - call office if I gain more than 2 pounds in one day or 5 pounds in one week - use salt in moderation - watch for swelling in feet, ankles and legs every day - weigh myself daily  Follow Up Plan: Telephone follow up appointment with care management team member scheduled for:09/18/20 @ 1:00pm     Patient Care Plan: Diabetes Type 2 (Adult)     Problem Identified: Glycemic Management (Diabetes, Type 2)      Long-Range Goal: Glycemic Management Optimized   Start Date: 08/09/2020  Expected End Date: 11/08/2020  Recent Progress: On track  Priority: Medium  Note:   Objective:  Lab Results  Component Value Date   HGBA1C 7.7 (H) 06/07/2020   Lab Results  Component Value Date   CREATININE 2.81 (H) 06/10/2020   CREATININE 2.53 (H) 06/09/2020   CREATININE 2.54 (H) 06/08/2020   No results found for: EGFR Current Barriers:  Knowledge Deficits related to basic Diabetes pathophysiology and self care/management Knowledge Deficits related to medications used for management of diabetes Difficulty obtaining or cannot afford medications Does not have glucometer to monitor blood sugar Financial Constraints Cognitive Deficits Unable to self administer medications as prescribed Does not contact provider office for questions/concerns Case Manager Clinical Goal(s):  patient will demonstrate improved adherence to prescribed treatment plan for diabetes self care/management as evidenced by: daily monitoring and recording of CBG,  adherence to ADA/ carb modified diet, adherence to prescribed medication regimen, contacting provider for new or worsened symptoms or questions. Update-RNCM reviewed note in  patients chart regarding elevated blood sugars and PCP would like for patient to be seen this week. Patient reports checking her BS daily, yesterday reading was 202. Patient with decreased appetite and unclear about what she is eating.  Interventions:  Inter-disciplinary care team collaboration (see longitudinal plan of care) Provided education to patient about basic DM disease process Discussed plans with patient for ongoing care management follow up and provided patient with direct contact information for care management team Provided patient with written educational materials related to hypo and hyperglycemia and importance of correct treatment Reviewed scheduled/upcoming provider appointments  Review of patient status, including review of consultants reports, relevant laboratory and other test results, and medications completed. Call to [employer], [name] for collaboration-message left Call to [facility] to schedule patient appointment for this week-per note in [place] left and requested call to patient  Discussed the importance of eating 3 small meals a day, including protein and vegetable Provided examples of meals and snacks Self-Care Activities - Self administers oral medications as prescribed Attends all scheduled provider appointments Checks blood sugars as prescribed and utilize hyper and hypoglycemia protocol as needed Adheres to prescribed ADA/carb modified Patient Goals: - adhere to a diabetic diet, eat three meals a day, including protein with each meal - check blood sugar at prescribed times - check blood sugar if I feel it is too high or too low - enter blood sugar readings and medication or insulin into daily log - take the blood sugar log to all doctor visits - take the blood sugar meter to all doctor visits  Follow Up Plan: Telephone follow up appointment with care management team member scheduled for:09/18/20 @ 1:00pm     Patient Care Plan:  Medication management     Problem Identified: Health Promotion or Disease Self-Management (General Plan of Care)      Goal: Medication Management  Note:   Current Barriers:  Unable to self administer medications as prescribed Does not adhere to prescribed medication regimen Does not maintain contact with provider office Does not contact provider office for questions/concerns   Pharmacist Clinical Goal(s):  Over the next 30 days, patient will contact provider office for questions/concerns as evidenced notation of same in electronic health record through collaboration with PharmD and provider.    Interventions: Inter-disciplinary care team collaboration (see longitudinal plan of care) Comprehensive medication review performed; medication list updated in electronic medical record  @RXCPDIABETES @ @RXCPHYPERTENSION @ @RXCPHYPERLIPIDEMIA @  Patient Goals/Self-Care Activities Over the next 30 days, patient will:  - collaborate with provider on medication access solutions  Follow Up Plan: The care management team will reach out to the patient again over the next 30 days.

## 2020-09-04 NOTE — Patient Outreach (Signed)
Medicaid Managed Care   Nurse Care Manager Note  09/04/2020 Name:  Latoya Walsh MRN:  [id number] DOB:  18-Jul-1960  Latoya Walsh is an 60 y.o. year old female who is a primary patient of Latoya Helper, MD.  The [facility] Managed Care Coordination team was consulted for assistance with:    CHF DMII  Latoya Walsh was given information about Medicaid Managed Care Coordination team services today. Latoya Walsh agreed to services and verbal consent obtained.  Engaged with patient by telephone for follow up visit in response to provider referral for Latoya management and/or care coordination services.   Assessments/Interventions:  Review of past medical history, allergies, medications, health status, including review of consultants reports, laboratory and other test data, was performed as part of comprehensive evaluation and provision of chronic care management services.  SDOH (Social Determinants of Health) assessments and interventions performed:   Care Plan  Allergies  Allergen Reactions   Penicillins Itching and Rash    Medications Reviewed Today     Reviewed by Latoya Walsh, [facility] (Pharmacist) on 08/21/20 at 1501  Med List Status: <None>   Medication Order Taking? Sig Documenting Provider Last Dose Status Informant  amLODipine (NORVASC) 5 MG tablet [id number] Yes TAKE ONE TABLET BY MOUTH ONCE DAILY. Latoya Helper, MD Taking Active   ergocalciferol (VITAMIN D2) 1.25 MG (50000 UT) capsule [id number] Yes Take 1 capsule (50,000 Units total) by mouth once a week. One capsule once weekly  Patient taking differently: Take 50,000 Units by mouth every Monday. One capsule once weekly   Latoya Helper, MD Taking Active            Med Note Latoya Walsh, [place] Feb 19, 2020  9:55 AM)    fluticasone Latoya Walsh) 50 MCG/ACT nasal spray [id number]  Place 1 spray into both nostrils daily. Latoya Dubois, MD  Active Self  furosemide (LASIX) 40 MG tablet [id number] Yes Take 1  tablet (40 mg total) by mouth 2 (two) times daily. Latoya Lenis, MD Taking Active   glipiZIDE (GLUCOTROL XL) 10 MG 24 hr tablet [id number] No Take 1 tablet (10 mg total) by mouth daily with breakfast.  Patient not taking: Reported on 08/21/2020   Latoya Helper, MD Not Taking Consider Medication Status and Discontinue Self  glipiZIDE (GLUCOTROL XL) 5 MG 24 hr tablet [id number] Yes TAKE 1 TABLET BY MOUTH DAILY WITH BREAKFAST. Latoya Helper, MD Taking Active   hydrALAZINE (APRESOLINE) 50 MG tablet [id number] Yes Take 1 tablet (50 mg total) by mouth every 8 (eight) hours. Latoya Dubois, MD Taking Active   isosorbide mononitrate (IMDUR) 30 MG 24 hr tablet [id number] Yes Take 1 tablet (30 mg total) by mouth daily. Latoya Lenis, MD Taking Active   losartan (COZAAR) 25 MG tablet [id number] Yes Take 25 mg by mouth daily. [provider] Taking Active   metoprolol succinate (TOPROL-XL) 50 MG 24 hr tablet [id number] Yes Take 1 tablet (50 mg total) by mouth daily. Take with or immediately following a meal. Branch, [name], MD Taking Active   mirtazapine (REMERON) 15 MG tablet [id number] Yes Take 15 mg by mouth at bedtime. [provider] Taking Active   rosuvastatin (CRESTOR) 10 MG tablet [id number] Yes Take 1 tablet (10 mg total) by mouth daily. Latoya Helper, MD Taking Active Self  sodium bicarbonate 650 MG tablet [id number] Yes Take 650 mg by mouth 2 (two) times daily. [provider] Taking Active   Med  List Note Latoya Walsh, [place] 58[birth year] 3382):              Patient Active Problem List   Diagnosis Date Noted   Acute CHF (congestive heart failure) ([place]) 06/06/2020   Leg swelling 04/28/2020   Hyperlipidemia LDL goal <100 04/28/2020   Left hand pain 03/10/2020   Anemia 02/19/2020   Cough    Acute renal failure superimposed on stage 3b chronic kidney disease ([place]) 01/17/2020   COVID-19 virus infection 12/26/2019   Counseled about COVID-19  virus infection 10/06/2019   Depression, major, single episode, in partial remission ([place]) 01/25/2019   Headache 11/13/2018   Insomnia 09/25/2018   Screening for colorectal cancer 07/23/2016   Lumbar back pain with radiculopathy affecting right lower extremity 03/27/2015   Non compliance with medical treatment 07/22/2014   Chronic hepatitis C without hepatic coma ([place]) 12/04/2013   Allergic rhinitis 11/03/2012   Severe hypertension 06/25/2011   Anxiety and depression 02/05/2011   Nicotine dependence 11/01/2010   Drug dependence, continuous abuse ([place]) 03/22/2010   Controlled type 2 diabetes with neuropathy ([place]) 06/02/2007   Alcohol abuse 06/02/2007    Conditions to be addressed/monitored per PCP order:  CHF and DMII  Care Plan : Heart Failure (Adult)  Updates made by Latoya Montane, RN since 09/04/2020 12:00 AM     Problem: Symptom Exacerbation (Heart Failure)      Long-Range Goal: Symptom Exacerbation Prevented or Minimized   Start Date: 08/09/2020  Expected End Date: 11/08/2020  Recent Progress: On track  Priority: High  Note:   Current Barriers:  Knowledge deficit related to basic heart failure pathophysiology and self care management-Latoya Walsh is managing her health with the assistance of an Aide. Today patient was alert, but confused at times and requested RNCM to speak with her Aide. Latoya Walsh was not at home and was unable to review her medications. She has recently began receiving Paramedic support for medication management. Latoya Walsh does not have a scale, glucometer or BP machine for health management.-Update-Latoya Walsh is unsure of medications. She does report taking all of her medications as directed(prepared by Latoya Walsh). She is working with [facility] Pharmacist, Latoya Walsh for medication management Knowledge Deficits related to heart failure medications Patient does not have readable scale Literacy Barriers Financial strain Cognitive Deficits Unable to self administer  medications as prescribed Does not adhere to provider recommendations re:  Does not contact provider office for questions/concerns Latoya Manager Clinical Goal(s):  patient will weigh self daily and record patient will verbalize understanding of Heart Failure Action Plan and when to call doctor patient will take all Heart Failure mediations as prescribed patient will weigh daily and record (notifying MD of 3 lb weight gain over night or 5 lb in a week) Interventions:  Discussed upcoming appointment on 6/22 @ [facility] Basic overview and discussion of pathophysiology of Heart Failure reviewed  Provided verbal education on low sodium diet Provided written education on low sodium diet Discussed importance of daily weight and advised patient to weigh and record daily Patient Goals/Self-Care Activities - attend scheduled appointment at the [facility] on 6/22 - replace chips with a healthy snack like nuts - eat more whole grains, fruits and vegetables, lean meats and healthy fats - know when to call the doctor - track symptoms and what helps feel better or worse - dress right for the weather, hot or cold  - call office if I gain more than 2 pounds in one day or  5 pounds in one week - use salt in moderation - watch for swelling in feet, ankles and legs every day - weigh myself daily  Follow Up Plan: Telephone follow up appointment with care management team member scheduled for:09/18/20 @ 1:00pm     Care Plan : Diabetes Type 2 (Adult)  Updates made by Siara Montane, RN since 09/04/2020 12:00 AM     Problem: Glycemic Management (Diabetes, Type 2)      Long-Range Goal: Glycemic Management Optimized   Start Date: 08/09/2020  Expected End Date: 11/08/2020  Recent Progress: On track  Priority: Medium  Note:   Objective:  Lab Results  Component Value Date   HGBA1C 7.7 (H) 06/07/2020   Lab Results  Component Value Date   CREATININE 2.81 (H) 06/10/2020   CREATININE  2.53 (H) 06/09/2020   CREATININE 2.54 (H) 06/08/2020   No results found for: EGFR Current Barriers:  Knowledge Deficits related to basic Diabetes pathophysiology and self care/management Knowledge Deficits related to medications used for management of diabetes Difficulty obtaining or cannot afford medications Does not have glucometer to monitor blood sugar Financial Constraints Cognitive Deficits Unable to self administer medications as prescribed Does not contact provider office for questions/concerns Latoya Manager Clinical Goal(s):  patient will demonstrate improved adherence to prescribed treatment plan for diabetes self care/management as evidenced by: daily monitoring and recording of CBG,  adherence to ADA/ carb modified diet, adherence to prescribed medication regimen, contacting provider for new or worsened symptoms or questions. Update-RNCM reviewed note in patients chart regarding elevated blood sugars and PCP would like for patient to be seen this week. Patient reports checking her BS daily, yesterday reading was 202. Patient with decreased appetite and unclear about what she is eating.  Interventions:  Inter-disciplinary care team collaboration (see longitudinal plan of care) Provided education to patient about basic DM disease process Discussed plans with patient for ongoing care management follow up and provided patient with direct contact information for care management team Provided patient with written educational materials related to hypo and hyperglycemia and importance of correct treatment Reviewed scheduled/upcoming provider appointments  Review of patient status, including review of consultants reports, relevant laboratory and other test results, and medications completed. Call to [employer], Latoya Walsh for collaboration-message left Call to [facility] to schedule patient appointment for this week-per note in [place] left and requested call to patient   Discussed the importance of eating 3 small meals a day, including protein and vegetable Provided examples of meals and snacks Self-Care Activities - Self administers oral medications as prescribed Attends all scheduled provider appointments Checks blood sugars as prescribed and utilize hyper and hypoglycemia protocol as needed Adheres to prescribed ADA/carb modified Patient Goals: - adhere to a diabetic diet, eat three meals a day, including protein with each meal - check blood sugar at prescribed times - check blood sugar if I feel it is too high or too low - enter blood sugar readings and medication or insulin into daily log - take the blood sugar log to all doctor visits - take the blood sugar meter to all doctor visits  Follow Up Plan: Telephone follow up appointment with care management team member scheduled for:09/18/20 @ 1:00pm      Follow Up:  Patient agrees to Care Plan and Follow-up.  Plan: The Managed Medicaid care management team will reach out to the patient again over the next 14 days.  Date/time of next scheduled RN care management/care coordination outreach:  09/18/20 @  1:00pm  [name] RN, BSN [place]  [employer] manager

## 2020-09-05 ENCOUNTER — Ambulatory Visit (INDEPENDENT_AMBULATORY_CARE_PROVIDER_SITE_OTHER): Payer: Medicaid Other | Admitting: Nurse Practitioner

## 2020-09-05 ENCOUNTER — Encounter: Payer: Self-pay | Admitting: Nurse Practitioner

## 2020-09-05 ENCOUNTER — Other Ambulatory Visit: Payer: Self-pay

## 2020-09-05 VITALS — BP 155/70 | HR 77 | Temp 98.9°F | Resp 16 | Ht 67.0 in | Wt 156.4 lb

## 2020-09-05 DIAGNOSIS — M79642 Pain in left hand: Secondary | ICD-10-CM

## 2020-09-05 DIAGNOSIS — I10 Essential (primary) hypertension: Secondary | ICD-10-CM

## 2020-09-05 DIAGNOSIS — E1140 Type 2 diabetes mellitus with diabetic neuropathy, unspecified: Secondary | ICD-10-CM | POA: Diagnosis not present

## 2020-09-05 MED ORDER — GABAPENTIN 100 MG PO CAPS: 100.0000 mg | ORAL_CAPSULE | Freq: Three times a day (TID) | ORAL | 3 refills | Status: DC

## 2020-09-05 MED ORDER — BLOOD GLUCOSE METER KIT: PACK | 0 refills | Status: DC

## 2020-09-05 NOTE — Assessment & Plan Note (Signed)
BP Readings from Last 3 Encounters:  09/05/20 (!) 155/70  07/25/20 134/80  07/18/20 130/84   -BP running high today

## 2020-09-05 NOTE — Patient Instructions (Signed)
She should follow-up with Dr. [name] at her regularly scheduled appointment.

## 2020-09-05 NOTE — Progress Notes (Signed)
Acute Office Visit  Subjective:    Patient ID: Latoya Walsh, female    DOB: Mar 17, 1961, 60 y.o.   MRN: [id number]  Chief Complaint  Patient presents with   Diabetes    [facility] called to get her an appt because her blood sugar had been running in the 200's but pt states she took her fasting sugar this am and it was 153   Hand Pain    Left hand pain, fingers throb like a toothache. Sees dr Aline Brochure but hes not doing anything for it she states     Diabetes  Hand Pain  Associated symptoms include numbness.  Patient is in today for blood sugar issues. [facility] told her to get an appointment because her blood sugar has been in the 200s consistently, but her BS was 153 this AM.  She states her left hand hurts like a toothache.  Past Medical History:  Diagnosis Date   Allergic rhinitis    Anxiety    Chronic back pain    Chronic hepatitis C without hepatic coma ([place])    COVID-19 virus infection 12/26/2019   Depression    Diabetes mellitus    Hepatitis C    Hypertension    Noncompliance    Poor appetite 07/17/2014   Substance abuse ([place])    HX of drug use and alcohol use    Past Surgical History:  Procedure Laterality Date   APPENDECTOMY  2004    Family History  Problem Relation Age of Onset   Diabetes Sister        Twin - AIDS     Social History   Socioeconomic History   Marital status: Single    Spouse name: Not on file   Number of children: 3   Years of education: Not on file   Highest education level: Not on file  Occupational History   Occupation: Disabled  Tobacco Use   Smoking status: Light Smoker    Packs/day: 0.25    Pack years: 0.00    Types: Cigarettes   Smokeless tobacco: Never  Vaping Use   Vaping Use: Never used  Substance and Sexual Activity   Alcohol use: Not Currently    Alcohol/week: 40.0 standard drinks    Types: 40 Cans of beer per week    Comment: 2 16oz cans of beer a day   Drug use: Not Currently    Types: Marijuana, Cocaine     Comment: Last smoked marijuana yesterday 10/18/14   Sexual activity: Yes    Birth control/protection: Condom  Other Topics Concern   Not on file  Social History Narrative   Not on file   Social Determinants of Health   Financial Resource Strain: Low Risk    Difficulty of Paying Living Expenses: Not hard at all  Food Insecurity: No Food Insecurity   Worried About Charity fundraiser in the Last Year: Never true   Arboriculturist in the Last Year: Never true  Transportation Needs: No Transportation Needs   Lack of Transportation (Medical): No   Lack of Transportation (Non-Medical): No  Physical Activity: Not on file  Stress: Not on file  Social Connections: Not on file  Intimate Partner Violence: Not on file    Outpatient Medications Prior to Visit  Medication Sig Dispense Refill   amLODipine (NORVASC) 5 MG tablet Take 1 tablet (5 mg total) by mouth daily. 90 tablet 3   blood glucose meter kit and supplies Dispense based on patient and  insurance preference. Once daily testing dx E11.9. 1 each 0   ergocalciferol (VITAMIN D2) 1.25 MG (50000 UT) capsule Take 1 capsule (50,000 Units total) by mouth once a week. One capsule once weekly (Patient taking differently: Take 50,000 Units by mouth every Monday. One capsule once weekly) 12 capsule 2   fluticasone (FLONASE) 50 MCG/ACT nasal spray Place 1 spray into both nostrils daily. 16 g 1   furosemide (LASIX) 40 MG tablet Take 1 tablet (40 mg total) by mouth 2 (two) times daily. 180 tablet 3   glipiZIDE (GLUCOTROL XL) 10 MG 24 hr tablet Take 1 tablet (10 mg total) by mouth daily with breakfast. 90 tablet 1   glipiZIDE (GLUCOTROL XL) 5 MG 24 hr tablet TAKE 1 TABLET BY MOUTH DAILY WITH BREAKFAST. 90 tablet 0   hydrALAZINE (APRESOLINE) 50 MG tablet Take 1 tablet (50 mg total) by mouth every 8 (eight) hours. 90 tablet 3   isosorbide mononitrate (IMDUR) 30 MG 24 hr tablet Take 1 tablet (30 mg total) by mouth daily. 90 tablet 3   losartan (COZAAR) 25  MG tablet Take 1 tablet (25 mg total) by mouth daily. 90 tablet 3   metoprolol succinate (TOPROL-XL) 50 MG 24 hr tablet Take 1 tablet (50 mg total) by mouth daily. Take with or immediately following a meal. 90 tablet 3   mirtazapine (REMERON) 15 MG tablet Take 15 mg by mouth at bedtime.     rosuvastatin (CRESTOR) 10 MG tablet Take 1 tablet (10 mg total) by mouth daily. 90 tablet 3   sodium bicarbonate 650 MG tablet Take 650 mg by mouth 2 (two) times daily.     No facility-administered medications prior to visit.    Allergies  Allergen Reactions   Penicillins Itching and Rash    Review of Systems  Constitutional: Negative.   Respiratory: Negative.    Cardiovascular: Negative.   Neurological:  Positive for numbness.       Left finger/hand pain that starts at her 2-5th metacarpals and radiates into her fingers  Psychiatric/Behavioral:  Negative for self-injury and suicidal ideas.       Objective:    Physical Exam Constitutional:      Appearance: Normal appearance.  Cardiovascular:     Rate and Rhythm: Normal rate and regular rhythm.     Pulses: Normal pulses.     Heart sounds: Normal heart sounds.  Pulmonary:     Effort: Pulmonary effort is normal.     Breath sounds: Normal breath sounds.  Musculoskeletal:        General: Normal range of motion.  Neurological:     Mental Status: She is alert.  Psychiatric:        Mood and Affect: Mood normal.     Comments: -low health literacy -poor judgement- smells of MJ today    BP (!) 155/70   Pulse 77   Temp 98.9 F (37.2 C) (Oral)   Resp 16   Ht 5' 7"  (1.702 m)   Wt 156 lb 6.4 oz (70.9 kg)   LMP 06/23/2010   SpO2 96%   BMI 24.50 kg/m  Wt Readings from Last 3 Encounters:  09/05/20 156 lb 6.4 oz (70.9 kg)  07/25/20 151 lb (68.5 kg)  07/18/20 150 lb (68 kg)    Health Maintenance Due  Topic Date Due   Pneumococcal Vaccine 4-65 Years old (1 - PCV) Never done   OPHTHALMOLOGY EXAM  Never done   Zoster Vaccines- Shingrix  (1 of 2) Never done  COVID-19 Vaccine (2 - Pfizer risk series) 10/20/2019    There are no preventive care reminders to display for this patient.   Lab Results  Component Value Date   TSH 1.673 06/07/2020   Lab Results  Component Value Date   WBC 6.7 06/07/2020   HGB 9.7 (L) 06/07/2020   HCT 32.0 (L) 06/07/2020   MCV 90.4 06/07/2020   PLT 210 06/07/2020   Lab Results  Component Value Date   NA 140 06/10/2020   K 3.9 06/10/2020   CO2 24 06/10/2020   GLUCOSE 124 (H) 06/10/2020   BUN 35 (H) 06/10/2020   CREATININE 2.81 (H) 06/10/2020   BILITOT 0.5 06/06/2020   ALKPHOS 87 06/06/2020   AST 26 06/06/2020   ALT 20 06/06/2020   PROT 6.1 (L) 06/06/2020   ALBUMIN 2.7 (L) 06/06/2020   CALCIUM 8.2 (L) 06/10/2020   ANIONGAP 10 06/10/2020   Lab Results  Component Value Date   CHOL 167 05/02/2020   Lab Results  Component Value Date   HDL 34 (L) 05/02/2020   Lab Results  Component Value Date   LDLCALC 87 05/02/2020   Lab Results  Component Value Date   TRIG 277 (H) 05/02/2020   Lab Results  Component Value Date   CHOLHDL 4.9 (H) 05/02/2020   Lab Results  Component Value Date   HGBA1C 7.7 (H) 06/07/2020       Assessment & Plan:   Problem List Items Addressed This Visit       Cardiovascular and Mediastinum   Severe hypertension    BP Readings from Last 3 Encounters:  09/05/20 (!) 155/70  07/25/20 134/80  07/18/20 130/84  -BP running high today         Endocrine   Controlled type 2 diabetes with neuropathy (HCC)    Lab Results  Component Value Date   HGBA1C 7.7 (H) 06/07/2020  -takes glipizide for blood sugar control -no metformin d/t CKD-4 -considered lantus, but with low health literacy unsure if she would take the medication as prescribed -referral to endo; would like her to get DM educator/RD with their team        Relevant Orders   Ambulatory referral to Endocrinology     Other   Left hand pain - Primary    -she has been seeing ortho,  but states she still has pain -Rx. Gabapentin; no renal adjustment required -will treat for neuropathy -has hx of cubital tunnel syndrome; had gabapentin in the past -if no improvement; return to ortho       Relevant Medications   gabapentin (NEURONTIN) 100 MG capsule     Meds ordered this encounter  Medications   gabapentin (NEURONTIN) 100 MG capsule    Sig: Take 1 capsule (100 mg total) by mouth 3 (three) times daily.    Dispense:  90 capsule    Refill:  [place], NP

## 2020-09-05 NOTE — Assessment & Plan Note (Addendum)
Lab Results  Component Value Date   HGBA1C 7.7 (H) 06/07/2020   -takes glipizide for blood sugar control -no metformin d/t CKD-4 -considered lantus, but with low health literacy unsure if she would take the medication as prescribed -referral to endo; would like her to get DM educator/RD with their team

## 2020-09-05 NOTE — Assessment & Plan Note (Signed)
-  she has been seeing ortho, but states she still has pain -Rx. Gabapentin; no renal adjustment required -will treat for neuropathy -has hx of cubital tunnel syndrome; had gabapentin in the past -if no improvement; return to ortho

## 2020-09-06 ENCOUNTER — Telehealth: Payer: Self-pay

## 2020-09-06 NOTE — Telephone Encounter (Signed)
Hi Michael, I received this referral on this patient, looking at your last note with her, are you wanting her to see [place] for Diabetes Management or to see the Registered Dietician?  Thank you

## 2020-09-06 NOTE — Telephone Encounter (Signed)
Ok thank you 

## 2020-09-06 NOTE — Telephone Encounter (Signed)
Diabetes management... I just wanted the note to show that she would probably need some education too.

## 2020-09-09 ENCOUNTER — Other Ambulatory Visit: Payer: Self-pay | Admitting: Internal Medicine

## 2020-09-09 DIAGNOSIS — E559 Vitamin D deficiency, unspecified: Secondary | ICD-10-CM

## 2020-09-09 DIAGNOSIS — E1140 Type 2 diabetes mellitus with diabetic neuropathy, unspecified: Secondary | ICD-10-CM

## 2020-09-09 DIAGNOSIS — F5104 Psychophysiologic insomnia: Secondary | ICD-10-CM

## 2020-09-09 DIAGNOSIS — I10 Essential (primary) hypertension: Secondary | ICD-10-CM

## 2020-09-09 MED ORDER — GLIPIZIDE ER 5 MG PO TB24: 5.0000 mg | ORAL_TABLET | Freq: Every day | ORAL | 0 refills | Status: DC

## 2020-09-09 MED ORDER — MIRTAZAPINE 15 MG PO TABS: 15.0000 mg | ORAL_TABLET | Freq: Every day | ORAL | 1 refills | Status: DC

## 2020-09-09 MED ORDER — ERGOCALCIFEROL 1.25 MG (50000 UT) PO CAPS: 50000.0000 [IU] | ORAL_CAPSULE | ORAL | 2 refills | Status: DC

## 2020-09-09 MED ORDER — HYDRALAZINE HCL 50 MG PO TABS: 50.0000 mg | ORAL_TABLET | Freq: Three times a day (TID) | ORAL | 3 refills | Status: DC

## 2020-09-10 ENCOUNTER — Ambulatory Visit: Payer: Medicaid Other | Admitting: Student

## 2020-09-11 ENCOUNTER — Ambulatory Visit (INDEPENDENT_AMBULATORY_CARE_PROVIDER_SITE_OTHER): Payer: Medicaid Other | Admitting: Student

## 2020-09-11 ENCOUNTER — Encounter: Payer: Self-pay | Admitting: Student

## 2020-09-11 ENCOUNTER — Other Ambulatory Visit: Payer: Self-pay

## 2020-09-11 VITALS — BP 122/68 | HR 74 | Ht 67.0 in | Wt 161.0 lb

## 2020-09-11 DIAGNOSIS — I10 Essential (primary) hypertension: Secondary | ICD-10-CM

## 2020-09-11 DIAGNOSIS — N184 Chronic kidney disease, stage 4 (severe): Secondary | ICD-10-CM | POA: Diagnosis not present

## 2020-09-11 DIAGNOSIS — E782 Mixed hyperlipidemia: Secondary | ICD-10-CM

## 2020-09-11 DIAGNOSIS — I5022 Chronic systolic (congestive) heart failure: Secondary | ICD-10-CM | POA: Diagnosis not present

## 2020-09-11 NOTE — Patient Instructions (Signed)
Medication Instructions:  Your physician recommends that you continue on your current medications as directed. Please refer to the Current Medication list given to you today.  *If you need a refill on your cardiac medications before your next appointment, please call your pharmacy*   Lab Work: None If you have labs (blood work) drawn today and your tests are completely normal, you will receive your results only by: [place] (if you have MyChart) OR A paper copy in the mail If you have any lab test that is abnormal or we need to change your treatment, we will call you to review the results.   Testing/Procedures: Your physician has requested that you have an echocardiogram. Echocardiography is a painless test that uses sound waves to create images of your heart. It provides your doctor with information about the size and shape of your heart and how well your heart's chambers and valves are working. This procedure takes approximately one hour. There are no restrictions for this procedure.    Follow-Up: At [facility], you and your health needs are our priority.  As part of our continuing mission to provide you with exceptional heart care, we have created designated Provider Care Teams.  These Care Teams include your primary Cardiologist (physician) and Advanced Practice Providers (APPs -  Physician Assistants and Nurse Practitioners) who all work together to provide you with the care you need, when you need it.  We recommend signing up for the patient portal called "MyChart".  Sign up information is provided on this After Visit Summary.  MyChart is used to connect with patients for Virtual Visits (Telemedicine).  Patients are able to view lab/test results, encounter notes, upcoming appointments, etc.  Non-urgent messages can be sent to your provider as well.   To learn more about what you can do with MyChart, go to [email or web address].    Your next appointment:   Follow up after  Echo with Bernerd Pho, PA-C or Carlyle Dolly, MD   Other Instructions

## 2020-09-11 NOTE — Progress Notes (Signed)
Cardiology Office Note    Date:  09/11/2020   ID:  Latoya Walsh, Latoya Walsh 1960/08/04, MRN [id number]  PCP:  [name], MD  Cardiologist: Carlyle Dolly, MD    Chief Complaint  Patient presents with   Follow-up    6 week visit    History of Present Illness:    Latoya Walsh is a 60 y.o. female with past medical history of HFrEF (EF 30-35% by echo in 05/2020), HTN, HLD, Type 2 DM, Stage 4 CKD, Hepatitis C and history of substance use who presents to the office today for 6-week follow-up.  She was last examined by Dr. Harl Bowie in 07/2020 and denied any recent chest pain or dyspnea and did not appear volume overloaded. Had been without her Toprol-XL, Imdur and Lasix and she was re-prescribed Hydralazine 66m TID, Imdur 353mdaily, Toprol-XL 5071maily and Lasix 33m55mD. Was on Losartan 25mg25mly per Nephrology. [facility]   In talking with the patient today, she reports feeling tired and is not making much conversation.  She does report compliance with her medications and is currently using a pillbox but says her medications were recently prescribed in a pill package and she has her medications for the next month at home. She reports compliance with her medications but is unsure of what is in the packets. Denies any recent chest pain or palpitations. Reports her breathing is "normal" and no specific orthopnea or PND. She does report occasional paresthesia-like pains at times in her legs but no pitting edema or claudication. Says she does not add additional salt to her food but does consume fast food and [place] regularly. Denies any recent alcohol use or substance use.    Past Medical History:  Diagnosis Date   Allergic rhinitis    Anxiety    CHF (congestive heart failure) (HCC) [place]. EF 30-35% by echo in 05/2020   Chronic back pain    Chronic hepatitis C without hepatic coma  (HCC) [place]OVID-19 virus infection 12/26/2019   Depression    Diabetes mellitus    Hepatitis C    Hypertension    Noncompliance    Poor appetite 07/17/2014   Substance abuse (HCC) [place] of drug use and alcohol use    Past Surgical History:  Procedure Laterality Date   APPENDECTOMY  2004    Current Medications: Outpatient Medications Prior to Visit  Medication Sig Dispense Refill   amLODipine (NORVASC) 5 MG tablet Take 1 tablet (5 mg total) by mouth daily. 90 tablet 3   blood glucose meter kit and supplies Dispense based on patient and insurance preference. Once daily testing dx E11.9. 1 each 0   ergocalciferol (VITAMIN D2) 1.25 MG (50000 UT) capsule Take 1 capsule (50,000 Units total) by mouth once a week. One capsule once weekly 12 capsule 2   fluticasone (FLONASE) 50 MCG/ACT nasal spray Place 1 spray into both nostrils daily. 16 g 1   furosemide (LASIX) 40 MG tablet Take 1 tablet (40 mg total) by mouth 2 (two) times daily. 180 tablet 3   gabapentin (NEURONTIN) 100 MG capsule Take 1 capsule (100 mg total) by mouth 3 (three) times daily. 90 capsule 3   glipiZIDE (GLUCOTROL XL) 10 MG 24 hr tablet Take 1 tablet (10 mg total) by mouth daily with breakfast. 90 tablet 1   glipiZIDE (GLUCOTROL XL) 5 MG 24  hr tablet Take 1 tablet (5 mg total) by mouth daily with breakfast. 90 tablet 0   hydrALAZINE (APRESOLINE) 50 MG tablet Take 1 tablet (50 mg total) by mouth every 8 (eight) hours. 90 tablet 3   isosorbide mononitrate (IMDUR) 30 MG 24 hr tablet Take 1 tablet (30 mg total) by mouth daily. 90 tablet 3   losartan (COZAAR) 25 MG tablet Take 1 tablet (25 mg total) by mouth daily. 90 tablet 3   metoprolol succinate (TOPROL-XL) 50 MG 24 hr tablet Take 1 tablet (50 mg total) by mouth daily. Take with or immediately following a meal. 90 tablet 3   mirtazapine (REMERON) 15 MG tablet Take 1 tablet (15 mg total) by mouth at bedtime. 90 tablet 1   rosuvastatin (CRESTOR) 10 MG tablet Take 1 tablet (10 mg  total) by mouth daily. 90 tablet 3   sodium bicarbonate 650 MG tablet Take 650 mg by mouth 2 (two) times daily.     calcitRIOL (ROCALTROL) 0.25 MCG capsule Take 0.25 mcg by mouth daily.     No facility-administered medications prior to visit.     Allergies:   Penicillins   Social History   Socioeconomic History   Marital status: Single    Spouse name: Not on file   Number of children: 3   Years of education: Not on file   Highest education level: Not on file  Occupational History   Occupation: Disabled  Tobacco Use   Smoking status: Light Smoker    Packs/day: 0.25    Pack years: 0.00    Types: Cigarettes   Smokeless tobacco: Never  Vaping Use   Vaping Use: Never used  Substance and Sexual Activity   Alcohol use: Not Currently    Alcohol/week: 40.0 standard drinks    Types: 40 Cans of beer per week    Comment: 2 16oz cans of beer a day   Drug use: Not Currently    Types: Marijuana, Cocaine    Comment: Last smoked marijuana yesterday 10/18/14   Sexual activity: Yes    Birth control/protection: Condom  Other Topics Concern   Not on file  Social History Narrative   Not on file   Social Determinants of Health   Financial Resource Strain: Low Risk    Difficulty of Paying Living Expenses: Not hard at all  Food Insecurity: No Food Insecurity   Worried About Charity fundraiser in the Last Year: Never true   Arboriculturist in the Last Year: Never true  Transportation Needs: No Transportation Needs   Lack of Transportation (Medical): No   Lack of Transportation (Non-Medical): No  Physical Activity: Not on file  Stress: Not on file  Social Connections: Not on file     Family History:  The patient's family history includes Diabetes in her sister.   Review of Systems:    Please see the history of present illness.     All other systems reviewed and are otherwise negative except as noted above.   Physical Exam:    VS:  BP 122/68   Pulse 74   Ht 5' 7"  (1.702 m)    Wt 161 lb (73 kg)   LMP 06/23/2010   SpO2 98%   BMI 25.22 kg/m    General: Well developed, well nourished,female appearing in no acute distress. Head: Normocephalic, atraumatic. Neck: No carotid bruits. JVD not elevated.  Lungs: Respirations regular and unlabored, without wheezes or rales.  Heart: Regular rate and rhythm. No S3  or S4.  No murmur, no rubs, or gallops appreciated. Abdomen: Appears non-distended. No obvious abdominal masses. Msk:  Strength and tone appear normal for age. No obvious joint deformities or effusions. Extremities: No clubbing or cyanosis. No pitting edema.  Distal pedal pulses are 2+ bilaterally. Neuro: Alert and oriented X 3. Moves all extremities spontaneously. No focal deficits noted. Psych:  Responds to questions appropriately with a normal affect. Skin: No rashes or lesions noted  Wt Readings from Last 3 Encounters:  09/11/20 161 lb (73 kg)  09/05/20 156 lb 6.4 oz (70.9 kg)  07/25/20 151 lb (68.5 kg)     Studies/Labs Reviewed:   EKG:  EKG is not ordered today.    Recent Labs: 06/06/2020: ALT 20; B Natriuretic Peptide 1,241.0 06/07/2020: Hemoglobin 9.7; Platelets 210; TSH 1.673 06/10/2020: BUN 35; Creatinine, Ser 2.81; Potassium 3.9; Sodium 140   Lipid Panel    Component Value Date/Time   CHOL 167 05/02/2020 1535   TRIG 277 (H) 05/02/2020 1535   HDL 34 (L) 05/02/2020 1535   CHOLHDL 4.9 (H) 05/02/2020 1535   CHOLHDL 4.8 03/28/2019 1056   VLDL 14 10/02/2016 0821   LDLCALC 87 05/02/2020 1535   LDLCALC 136 (H) 03/28/2019 1056    Additional studies/ records that were reviewed today include:   Echocardiogram: 05/2020 IMPRESSIONS     1. Left ventricular ejection fraction, by estimation, is 30 to 35%. The  left ventricle has moderately decreased function. The left ventricle  demonstrates global hypokinesis. Left ventricular diastolic parameters are  indeterminate.   2. Right ventricular systolic function is low normal. The right   ventricular size is normal. There is moderately elevated pulmonary artery  systolic pressure. The estimated right ventricular systolic pressure is  20.9 mmHg.   3. A small pericardial effusion is present. The pericardial effusion is  posterior to the left ventricle.   4. The mitral valve is grossly normal. Mild mitral valve regurgitation.   5. Tricuspid valve regurgitation is moderate.   6. The aortic valve is tricuspid. Aortic valve regurgitation is not  visualized.   7. The inferior vena cava is normal in size with <50% respiratory  variability, suggesting right atrial pressure of 8 mmHg.   Assessment:    1. Chronic systolic heart failure ([place])   2. Essential hypertension   3. Mixed hyperlipidemia   4. CKD (chronic kidney disease) stage 4, GFR 15-29 ml/min (HCC)      Plan:   In order of problems listed above:  1. HFrEF - Her EF was at 30 to 35% by most recent echocardiogram in 05/2020. Ischemic evaluation has not been pursued given her Stage IV CKD. - She denies any dyspnea on exertion, orthopnea or lower extremity edema. Appears euvolemic by examination today. Reviewed the importance of following a low-sodium diet.  - Will continue her current medication regimen for now with Hydralazine 50 mg 3 times daily, Imdur 30 mg daily and Toprol-XL 50 mg daily. Will defer dosing of Losartan and Lasix to Nephrology. Not on Entresto, Spironolactone or an SGLT2 inhibitor given her CKD. I did not further titrate her Toprol-XL, Imdur or Hydralazine today given her reported fatigue and due to the fact that she already has her medications for the next month at home as compliance has been an issue and I am hopeful this will improve with the use of pill packs. Will plan for a repeat echocardiogram in 2 months. If EF remains reduced, would plan to further titrate Toprol-XL or Hydralazine/Nitrates (Amlodipine could  be stopped if BP is a limiting factor).  2. HTN - Her blood pressure is well controlled  at 122/68 during today's visit. Continue current medication regimen with Amlodipine 5 mg daily, Hydralazine 50 mg TID, Imdur 30m daily and Losartan 225mdaily. Will defer continuation of Losartan to Nephrology.   3. HLD - FLP in 04/2020 showed total cholesterol 167, HDL 34, triglycerides 277 and LDL 87.  She remains on Crestor 10 mg daily.  4. Stage 4 CKD - Followed by Dr. BhTheador HawthorneCreatinine was at 2.52 in 07/2020 by review of Care Everywhere.    Medication Adjustments/Labs and Tests Ordered: Current medicines are reviewed at length with the patient today.  Concerns regarding medicines are outlined above.  Medication changes, Labs and Tests ordered today are listed in the Patient Instructions below. Patient Instructions  Medication Instructions:  Your physician recommends that you continue on your current medications as directed. Please refer to the Current Medication list given to you today.  *If you need a refill on your cardiac medications before your next appointment, please call your pharmacy*   Lab Work: None If you have labs (blood work) drawn today and your tests are completely normal, you will receive your results only by: [place]if you have MyChart) OR A paper copy in the mail If you have any lab test that is abnormal or we need to change your treatment, we will call you to review the results.   Testing/Procedures: Your physician has requested that you have an echocardiogram. Echocardiography is a painless test that uses sound waves to create images of your heart. It provides your doctor with information about the size and shape of your heart and how well your heart's chambers and valves are working. This procedure takes approximately one hour. There are no restrictions for this procedure.    Follow-Up: [facility].  As part of our continuing mission to provide you with exceptional heart care, we have created  designated Provider Care Teams.  These Care Teams include your primary Cardiologist (physician) and Advanced Practice Providers (APPs -  Physician Assistants and Nurse Practitioners) who all work together to provide you with the care you need, when you need it.  We recommend signing up for the patient portal called "MyChart".  Sign up information is provided on this After Visit Summary.  MyChart is used to connect with patients for Virtual Visits (Telemedicine).  Patients are able to view lab/test results, encounter notes, upcoming appointments, etc.  Non-urgent messages can be sent to your provider as well.   To learn more about what you can do with MyChart, go to [email or web address]   Your next appointment:   Follow up after Echo with BrBernerd PhoPA-C or JoCarlyle DollyMD   Other Instructions     Signed, BrErma HeritagePA-C  09/11/2020 5:21 PM    [place]eNorthwoods[place] [address]hone: [phone]: [phone]

## 2020-09-12 NOTE — Patient Outreach (Signed)
Called [facility] to re-request [employer] to be sent to [facility].

## 2020-09-17 ENCOUNTER — Ambulatory Visit (INDEPENDENT_AMBULATORY_CARE_PROVIDER_SITE_OTHER): Payer: Medicaid Other | Admitting: Family Medicine

## 2020-09-17 ENCOUNTER — Other Ambulatory Visit: Payer: Self-pay

## 2020-09-17 ENCOUNTER — Encounter: Payer: Self-pay | Admitting: Family Medicine

## 2020-09-17 ENCOUNTER — Ambulatory Visit: Payer: Medicaid Other

## 2020-09-17 VITALS — BP 139/65 | HR 80 | Temp 97.9°F | Resp 20 | Ht 67.0 in | Wt 165.0 lb

## 2020-09-17 DIAGNOSIS — E785 Hyperlipidemia, unspecified: Secondary | ICD-10-CM | POA: Diagnosis not present

## 2020-09-17 DIAGNOSIS — Z91199 Patient's noncompliance with other medical treatment and regimen due to unspecified reason: Secondary | ICD-10-CM

## 2020-09-17 DIAGNOSIS — F419 Anxiety disorder, unspecified: Secondary | ICD-10-CM

## 2020-09-17 DIAGNOSIS — Z9119 Patient's noncompliance with other medical treatment and regimen: Secondary | ICD-10-CM | POA: Diagnosis not present

## 2020-09-17 DIAGNOSIS — I10 Essential (primary) hypertension: Secondary | ICD-10-CM

## 2020-09-17 DIAGNOSIS — E1165 Type 2 diabetes mellitus with hyperglycemia: Secondary | ICD-10-CM

## 2020-09-17 DIAGNOSIS — F17218 Nicotine dependence, cigarettes, with other nicotine-induced disorders: Secondary | ICD-10-CM | POA: Diagnosis not present

## 2020-09-17 DIAGNOSIS — F32A Depression, unspecified: Secondary | ICD-10-CM | POA: Diagnosis not present

## 2020-09-17 LAB — POCT GLYCOSYLATED HEMOGLOBIN (HGB A1C)
HbA1c POC (<> result, manual entry): 8.5 % (ref 4.0–5.6)
HbA1c, POC (controlled diabetic range): 8.5 % — AB (ref 0.0–7.0)
HbA1c, POC (prediabetic range): 8.5 % — AB (ref 5.7–6.4)
Hemoglobin A1C: 8.5 % — AB (ref 4.0–5.6)

## 2020-09-17 LAB — HM DIABETES EYE EXAM

## 2020-09-17 MED ORDER — GLIPIZIDE ER 10 MG PO TB24: ORAL_TABLET | ORAL | 5 refills | Status: DC

## 2020-09-17 NOTE — Patient Instructions (Signed)
F/U in 3.5  months, call if you need me sooner  New higher dose of glipizide as sugar is uncontrolled, 10 mg two tablets once daily  Eye exam today  Reconsider vaccines, you need them  Work on quitting smoking  Fasting lipid, cmp and EGFr and HBA1C 1 week before next visit

## 2020-09-17 NOTE — Progress Notes (Signed)
Latoya Walsh     MRN: [id number]      DOB: [birth year]   HPI Latoya Walsh is here for follow up and re-evaluation of chronic medical conditions, medication management and review of any available recent lab and radiology data.  Denies polyuria, polydipsia, blurred vision , or hypoglycemic episodes. Not testing regularly   ROS Denies recent fever or chills. Denies sinus pressure, nasal congestion, ear pain or sore throat. Denies chest congestion, productive cough or wheezing. Denies chest pains, palpitations and leg swelling Denies abdominal pain, nausea, vomiting,diarrhea or constipation.   Denies dysuria, frequency, hesitancy or incontinence. Denies headaches, seizures, numbness, or tingling. C/o increased  depression and  anxiety as her twin is very ill and is living far away from her Denies skin break down or rash.   PE  BP 139/65 (BP Location: Right Arm, Patient Position: Sitting, Cuff Size: Large)   Pulse 80   Temp 97.9 F (36.6 C)   Resp 20   Ht '5\' 7"'$  (1.702 m)   Wt 165 lb (74.8 kg)   LMP 06/23/2010   SpO2 98%   BMI 25.84 kg/m   Patient alert and oriented and in no cardiopulmonary distress.  HEENT: No facial asymmetry, EOMI,     Neck supple .  Chest: Clear to auscultation bilaterally.  CVS: S1, S2 no murmurs, no S3.Regular rate.  ABD: Soft non tender.   Ext: No edema  MS: Adequate ROM spine, shoulders, hips and knees.  Skin: Intact, no ulcerations or rash noted.  Psych: Good eye contact, normal affect. Memory intact not anxious or depressed appearing.  CNS: CN 2-12 intact, power,  normal throughout.no focal deficits noted.   Assessment & Plan  Uncontrolled type 2 diabetes mellitus with hyperglycemia (HCC) Inc glipizide dose to 10 mg daily Latoya Walsh is reminded of the importance of commitment to daily physical activity for 30 minutes or more, as able and the need to limit carbohydrate intake to 30 to 60 grams per meal to help with blood sugar  control.   The need to take medication as prescribed, test blood sugar as directed, and to call between visits if there is a concern that blood sugar is uncontrolled is also discussed.   Latoya Walsh is reminded of the importance of daily foot exam, annual eye examination, and good blood sugar, blood pressure and cholesterol control.  Diabetic Labs Latest Ref Rng & Units 09/17/2020 09/17/2020 09/17/2020 09/17/2020 06/10/2020  HbA1c 4.0 - 5.6 % 8.5(A) 8.5(A) 8.5 8.5(A) -  Microalbumin mg/dL - - - - -  Micro/Creat Ratio 0.0 - 30.0 mg/g - - - - -  Chol 100 - 199 mg/dL - - - - -  HDL >39 mg/dL - - - - -  Calc LDL 0 - 99 mg/dL - - - - -  Triglycerides 0 - 149 mg/dL - - - - -  Creatinine 0.44 - 1.00 mg/dL - - - - 2.81(H)   BP/Weight 09/17/2020 09/11/2020 09/05/2020 07/25/2020 07/18/2020 06/26/2020 [id number]  Systolic BP [id number]  Diastolic BP 65 68 70 80 84 88 80  Wt. (Lbs) 165 161 156.4 151 150 159 163.14  BMI 25.84 25.22 24.5 23.65 23.49 24.9 25.55   Foot/eye exam completion dates Latest Ref Rng & Units 09/17/2020 03/05/2020  Eye Exam No Retinopathy No Retinopathy -  Foot Form Completion - - Done        Nicotine dependence Asked:confirms currently smokes cigarettes Assess: Unwilling to set a quit  date, but is cutting back Advise: needs to QUIT to reduce risk of cancer, cardio and cerebrovascular disease Assist: counseled for 5 minutes and literature provided Arrange: follow up in 2 to 4 months   Hyperlipidemia LDL goal <100 Hyperlipidemia:Low fat diet discussed and encouraged.   Lipid Panel  Lab Results  Component Value Date   CHOL 167 05/02/2020   HDL 34 (L) 05/02/2020   LDLCALC 87 05/02/2020   TRIG 277 (H) 05/02/2020   CHOLHDL 4.9 (H) 05/02/2020   Updated lab needed at/ before next visit. uncontrolled    Anxiety and depression Inadequately treated significant psychosocial challenges , receiving as much support as she will take, not suicidal or homcidal, med  adherence a challenge also  Non compliance with medical treatment Ongoing challenge but she will be supported and encouraged as best able

## 2020-09-18 ENCOUNTER — Other Ambulatory Visit: Payer: Self-pay | Admitting: *Deleted

## 2020-09-18 NOTE — Patient Outreach (Signed)
Medicaid Managed Care   Nurse Care Manager Note  09/18/2020 Name:  Latoya Walsh MRN:  [id number] DOB:  March 20, 1961  Latoya Walsh is an 60 y.o. year old female who is a primary patient of Latoya Helper, MD.  The [facility] Managed Care Coordination team was consulted for assistance with:    CHF DMII  Latoya Walsh was given information about Medicaid Managed Care Coordination team Walsh today. Latoya Walsh and verbal consent obtained.  Engaged with patient by telephone for follow up visit in response to provider referral for case management and/or care coordination Walsh.   Assessments/Interventions:  Review of past medical history, allergies, medications, health status, including review of consultants reports, laboratory and other test data, was performed as part of comprehensive evaluation and provision of chronic care management Walsh.  SDOH (Social Determinants of Health) assessments and interventions performed:   Care Plan  Allergies  Allergen Reactions   Penicillins Itching and Rash    Medications Reviewed Today     Reviewed by Latoya Georgia, LPN (Licensed Practical Nurse) on 09/17/20 at [phone]  Med List Status: <None>   Medication Order Taking? Sig Documenting Provider Last Dose Status Informant  amLODipine (NORVASC) 5 MG tablet [id number] Yes Take 1 tablet (5 mg total) by mouth daily. Latoya Lenis, MD Taking Active   blood glucose meter kit and supplies [id number] Yes Dispense based on patient and insurance preference. Once daily testing dx E11.9. Latoya Helper, MD Taking Active   ergocalciferol (VITAMIN D2) 1.25 MG (50000 UT) capsule [id number] Yes Take 1 capsule (50,000 Units total) by mouth once a week. One capsule once weekly Latoya Spar, MD Taking Active   fluticasone [facility]) 50 MCG/ACT nasal spray [id number] Yes Place 1 spray into both nostrils daily. Latoya Dubois, MD Taking Active Self  furosemide (LASIX) 40 MG  tablet [id number] Yes Take 1 tablet (40 mg total) by mouth 2 (two) times daily. Latoya Lenis, MD Taking Active   gabapentin (NEURONTIN) 100 MG capsule [id number] Yes Take 1 capsule (100 mg total) by mouth 3 (three) times daily. Latoya Larsson, NP Taking Active   glipiZIDE (GLUCOTROL XL) 10 MG 24 hr tablet [id number] Yes Take 1 tablet (10 mg total) by mouth daily with breakfast. Latoya Helper, MD Taking Active   glipiZIDE (GLUCOTROL XL) 5 MG 24 hr tablet [id number] Yes Take 1 tablet (5 mg total) by mouth daily with breakfast. Latoya Spar, MD Taking Active   hydrALAZINE (APRESOLINE) 50 MG tablet [id number] Yes Take 1 tablet (50 mg total) by mouth every 8 (eight) hours. Latoya Spar, MD Taking Active   isosorbide mononitrate (IMDUR) 30 MG 24 hr tablet [id number] Yes Take 1 tablet (30 mg total) by mouth daily. Latoya Lenis, MD Taking Active   losartan (COZAAR) 25 MG tablet [id number] Yes Take 1 tablet (25 mg total) by mouth daily. Latoya Lenis, MD Taking Active   metoprolol succinate (TOPROL-XL) 50 MG 24 hr tablet [id number] Yes Take 1 tablet (50 mg total) by mouth daily. Take with or immediately following a meal. Branch, [name], MD Taking Active   mirtazapine (REMERON) 15 MG tablet [id number] Yes Take 1 tablet (15 mg total) by mouth at bedtime. Latoya Spar, MD Taking Active   rosuvastatin (CRESTOR) 10 MG tablet [id number] Yes Take 1 tablet (10 mg total) by mouth daily. Latoya Lenis, MD Taking Active   sodium bicarbonate 650 MG tablet [id number] Yes Take 650 mg by  mouth 2 (two) times daily. [provider] Taking Active   Med List Note Latoya Walsh, [place] 53/61/44 3154):              Patient Active Problem List   Diagnosis Date Noted   Acute CHF (congestive heart failure) ([place]) 06/06/2020   Leg swelling 04/28/2020   Hyperlipidemia LDL goal <100 04/28/2020   Left hand pain 03/10/2020   Anemia 02/19/2020   Cough    Acute renal failure superimposed  on stage 3b chronic kidney disease ([place]) 01/17/2020   Depression, major, single episode, in partial remission ([place]) 01/25/2019   Headache 11/13/2018   Insomnia 09/25/2018   Screening for colorectal cancer 07/23/2016   Lumbar back pain with radiculopathy affecting right lower extremity 03/27/2015   Non compliance with medical treatment 07/22/2014   Chronic hepatitis C without hepatic coma ([place]) 12/04/2013   Allergic rhinitis 11/03/2012   Severe hypertension 06/25/2011   Anxiety and depression 02/05/2011   Nicotine dependence 11/01/2010   Drug dependence, continuous abuse ([place]) 03/22/2010   Controlled type 2 diabetes with neuropathy ([place]) 06/02/2007   Alcohol abuse 06/02/2007    Conditions to be addressed/monitored per PCP order:  CHF and DMII  Care Plan : Heart Failure (Adult)  Updates made by Latoya Walsh since 09/18/2020 12:00 AM     Problem: Symptom Exacerbation (Heart Failure)      Long-Range Goal: Symptom Exacerbation Prevented or Minimized   Start Date: 08/09/2020  Expected End Date: 11/08/2020  Recent Progress: On track  Priority: High  Note:   Current Barriers:  Knowledge deficit related to basic heart failure pathophysiology and self care management-Latoya Walsh is managing her health with the assistance of an Aide. Today patient was alert, but confused at times and requested RNCM to speak with her Aide. Latoya Walsh was not at home and was unable to review her medications. She has recently began receiving Paramedic support for medication management. -Update-Latoya Walsh is unsure of medications. She is taking her "bubble packs" 3 times a day and is part of the Paramedicine program. Latoya Walsh comes out every Friday and checks BP and BS. She is aware of echocardiogram scheduled 7/7. Knowledge Deficits related to heart failure medications Patient does not have readable scale Literacy Barriers Financial strain Cognitive Deficits Unable to self administer medications as  prescribed Does not adhere to provider recommendations re:  Does not contact provider office for questions/concerns Case Manager Clinical Goal(s):  patient will weigh self daily and record patient will verbalize understanding of Heart Failure Action Plan and when to call doctor patient will take all Heart Failure mediations as prescribed patient will weigh daily and record (notifying MD of 3 lb weight gain over night or 5 lb in a week) Interventions:  Discussed upcoming appointment on 7/7 for echocardiogram Basic overview and discussion of pathophysiology of Heart Failure reviewed  Provided verbal education on low sodium diet Provided written education on low sodium diet Discussed importance of daily weight and advised patient to weigh and record daily Patient Goals/Self-Care Activities - attend scheduled appointment at the [facility] on 6/22 - replace chips with a healthy snack like nuts - eat more whole grains, fruits and vegetables, lean meats and healthy fats - know when to call the doctor - track symptoms and what helps feel better or worse - dress right for the weather, hot or cold  - call office if I gain more than 2 pounds in one day or 5 pounds in one  week - use salt in moderation - watch for swelling in feet, ankles and legs every day - weigh myself daily  Follow Up Plan: Telephone follow up appointment with care management team member scheduled for:09/30/20 @ 9:00am     Care Plan : Diabetes Type 2 (Adult)  Updates made by Evonda Montane, Walsh since 09/18/2020 12:00 AM     Problem: Glycemic Management (Diabetes, Type 2)      Long-Range Goal: Glycemic Management Optimized   Start Date: 08/09/2020  Expected End Date: 11/08/2020  Recent Progress: On track  Priority: Medium  Note:   Objective:  Lab Results  Component Value Date   HGBA1C 8.5 (A) 09/17/2020   HGBA1C 8.5 09/17/2020   HGBA1C 8.5 (A) 09/17/2020   HGBA1C 8.5 (A) 09/17/2020   Lab Results   Component Value Date   CREATININE 2.81 (H) 06/10/2020   CREATININE 2.53 (H) 06/09/2020   CREATININE 2.54 (H) 06/08/2020   No results found for: EGFR Current Barriers:  Knowledge Deficits related to basic Diabetes pathophysiology and self care/management-RNCM reviewed note in patients chart regarding elevated blood sugars and PCP would like for patient to be seen this week. Patient reports checking her BS daily, yesterday reading was 202. Patient with decreased appetite and unclear about what she is eating.-Update-Patient was evaluated and referral to Endocrinology was placed. Patient reports checking fasting BS this morning 255, she ate a hot pocket and ice cream. Now feeling weird. RNCM asked patient to check BS-341. Instructed patient to drink a bottle of water and recheck in one hour and call her provider if >400. Knowledge Deficits related to medications used for management of diabetes Difficulty obtaining or cannot afford medications Does not have glucometer to monitor blood sugar Financial Constraints Cognitive Deficits Unable to self administer medications as prescribed Does not contact provider office for questions/concerns Case Manager Clinical Goal(s):  patient will demonstrate improved adherence to prescribed treatment plan for diabetes self care/management as evidenced by: daily monitoring and recording of CBG,  adherence to ADA/ carb modified diet, adherence to prescribed medication regimen, contacting provider for new or worsened symptoms or questions.   Interventions:  Inter-disciplinary care team collaboration (see longitudinal plan of care) Provided education to patient about basic DM disease process Discussed plans with patient for ongoing care management follow up and provided patient with direct contact information for care management team Provided patient with written educational materials related to hypo and hyperglycemia and importance of correct treatment Reviewed  scheduled/upcoming provider appointments  Review of patient status, including review of consultants reports, relevant laboratory and other test results, and medications completed. Called to [facility] to schedule patient appointment, office was closed, message left for return call to patient  Discussed the importance of eating 3 small meals a day, including protein and vegetable Provided examples of meals and snacks Provided education on hyperglycemia, instructed patient to call provider [place] [phone] if recheck >400 Self-Care Activities - Self administers oral medications as prescribed Attends all scheduled provider appointments Checks blood sugars as prescribed and utilize hyper and hypoglycemia protocol as needed Adheres to prescribed ADA/carb modified Patient Goals: - adhere to a diabetic diet, eat three meals a day, including protein with each meal - check blood sugar at prescribed times - check blood sugar if I feel it is too high or too low - enter blood sugar readings and medication or insulin into daily log - take the blood sugar log to all doctor visits - take the blood sugar  meter to all doctor visits  Follow Up Plan: Telephone follow up appointment with care management team member scheduled for:09/30/20 @ 9:00am      Follow Up:  Patient agrees to Care Plan and Follow-up.  Plan: The Managed Medicaid care management team will reach out to the patient again over the next 14 days.  Date/time of next scheduled Walsh care management/care coordination outreach:  09/30/20 @ [place] Walsh, BSN [place]  [employer] manager

## 2020-09-18 NOTE — Patient Instructions (Signed)
Visit Information  Latoya Walsh was given information about Medicaid Managed Care team care coordination services as a part of their [place] [employer] benefit. Latoya Walsh  to engagement with the [facility] Managed Care team.   For questions related to your [facility], please call: [phone] or visit the homepage here: [email or web address]  If you would like to schedule transportation through your [facility], please call the following number at least 2 days in advance of your appointment: [phone].   Call the [facility] at [phone], at any time, 24 hours a day, 7 days a week. If you are in danger or need immediate medical attention call 911.  Latoya Walsh - following are the goals we discussed in your visit today:   Goals Addressed             This Visit's Progress    Monitor and Manage My Blood Sugar-Diabetes Type 2       Timeframe:  Long-Range Goal Priority:  Medium Start Date:  08/09/20                           Expected End Date:  11/09/20                     Follow Up Date  09/30/20   - call [facility] [phone] to schedule an appointment - adhere to a diabetic diet, eat three meals a day, including protein with each meal - check blood sugar at prescribed times - check blood sugar if I feel it is too high or too low - call your provider with abnormal readings - enter blood sugar readings and medication or insulin into daily log - take the blood sugar log to all doctor visits - take the blood sugar meter to all doctor visits    Why is this important?   Checking your blood sugar at home helps to keep it from getting very high or very low.  Writing the results in a diary or log helps the doctor know how to care for you.  Your blood sugar log should have the time, date and the results.  Also, write down the  amount of insulin or other medicine that you take.  Other information, like what you ate, exercise done and how you were feeling, will also be helpful.           Track and Manage Fluids and Swelling-Heart Failure       Timeframe:  Long-Range Goal Priority:  High Start Date:    08/09/20                         Expected End Date:    11/09/20                   Follow Up Date 09/30/20   - call office if I gain more than 2 pounds in one day or 5 pounds in one week - use salt in moderation - watch for swelling in feet, ankles and legs every day - weigh myself daily    Why is this important?   It is important to check your weight daily and watch how much salt and liquids you have.  It will help you to manage your heart failure.          Track and Manage Symptoms-Heart Failure  Timeframe:  Long-Range Goal Priority:  High Start Date:   08/09/20                          Expected End Date:    11/09/20                   Follow Up Date 09/30/20    - replace chips with a healthy snack like nuts - eat more whole grains, fruits and vegetables, lean meats and healthy fats - know when to call the doctor - track symptoms and what helps feel better or worse - dress right for the weather, hot or cold    Why is this important?   You will be able to handle your symptoms better if you keep track of them.  Making some simple changes to your lifestyle will help.  Eating healthy is one thing you can do to take good care of yourself.             Please see education materials related to diabetes and heart failure provided as print materials.   The patient verbalized understanding of instructions provided today and agreed to receive a mailed copy of patient instruction and/or educational materials.  Telephone follow up appointment with Managed Medicaid care management team member scheduled for:09/30/20 @ [place] RN, [place] [place] RN Care  Coordinator   Following is a copy of your plan of care:  Patient Care Plan: Heart Failure (Adult)     Problem Identified: Symptom Exacerbation (Heart Failure)      Long-Range Goal: Symptom Exacerbation Prevented or Minimized   Start Date: 08/09/2020  Expected End Date: 11/08/2020  Recent Progress: On track  Priority: High  Note:   Current Barriers:  Knowledge deficit related to basic heart failure pathophysiology and self care management-Latoya Walsh is managing her health with the assistance of an Aide. Today patient was alert, but confused at times and requested RNCM to speak with her Aide. Latoya Walsh was not at home and was unable to review her medications. She has recently began receiving Paramedic support for medication management. -Update-Latoya Walsh is unsure of medications. She is taking her "bubble packs" 3 times a day and is part of the Paramedicine program. Latoya Walsh comes out every Friday and checks BP and BS. She is aware of echocardiogram scheduled 7/7. Knowledge Deficits related to heart failure medications Patient does not have readable scale Literacy Barriers Financial strain Cognitive Deficits Unable to self administer medications as prescribed Does not adhere to provider recommendations re:  Does not contact provider office for questions/concerns Case Manager Clinical Goal(s):  patient will weigh self daily and record patient will verbalize understanding of Heart Failure Action Plan and when to call doctor patient will take all Heart Failure mediations as prescribed patient will weigh daily and record (notifying MD of 3 lb weight gain over night or 5 lb in a week) Interventions:  Discussed upcoming appointment on 7/7 for echocardiogram Basic overview and discussion of pathophysiology of Heart Failure reviewed  Provided verbal education on low sodium diet Provided written education on low sodium diet Discussed importance of daily weight and advised patient to weigh and  record daily Patient Goals/Self-Care Activities - attend scheduled appointment at the [facility] on 6/22 - replace chips with a healthy snack like nuts - eat more whole grains, fruits and vegetables, lean meats and healthy fats - know when to call the  doctor - track symptoms and what helps feel better or worse - dress right for the weather, hot or cold  - call office if I gain more than 2 pounds in one day or 5 pounds in one week - use salt in moderation - watch for swelling in feet, ankles and legs every day - weigh myself daily  Follow Up Plan: Telephone follow up appointment with care management team member scheduled for:09/30/20 @ 9:00am     Patient Care Plan: Diabetes Type 2 (Adult)     Problem Identified: Glycemic Management (Diabetes, Type 2)      Long-Range Goal: Glycemic Management Optimized   Start Date: 08/09/2020  Expected End Date: 11/08/2020  Recent Progress: On track  Priority: Medium  Note:   Objective:  Lab Results  Component Value Date   HGBA1C 8.5 (A) 09/17/2020   HGBA1C 8.5 09/17/2020   HGBA1C 8.5 (A) 09/17/2020   HGBA1C 8.5 (A) 09/17/2020   Lab Results  Component Value Date   CREATININE 2.81 (H) 06/10/2020   CREATININE 2.53 (H) 06/09/2020   CREATININE 2.54 (H) 06/08/2020   No results found for: EGFR Current Barriers:  Knowledge Deficits related to basic Diabetes pathophysiology and self care/management-RNCM reviewed note in patients chart regarding elevated blood sugars and PCP would like for patient to be seen this week. Patient reports checking her BS daily, yesterday reading was 202. Patient with decreased appetite and unclear about what she is eating.-Update-Patient was evaluated and referral to Endocrinology was placed. Patient reports checking fasting BS this morning 255, she ate a hot pocket and ice cream. Now feeling weird. RNCM asked patient to check BS-341. Instructed patient to drink a bottle of water and recheck in one hour and call  her provider if >400. Knowledge Deficits related to medications used for management of diabetes Difficulty obtaining or cannot afford medications Does not have glucometer to monitor blood sugar Financial Constraints Cognitive Deficits Unable to self administer medications as prescribed Does not contact provider office for questions/concerns Case Manager Clinical Goal(s):  patient will demonstrate improved adherence to prescribed treatment plan for diabetes self care/management as evidenced by: daily monitoring and recording of CBG,  adherence to ADA/ carb modified diet, adherence to prescribed medication regimen, contacting provider for new or worsened symptoms or questions.   Interventions:  Inter-disciplinary care team collaboration (see longitudinal plan of care) Provided education to patient about basic DM disease process Discussed plans with patient for ongoing care management follow up and provided patient with direct contact information for care management team Provided patient with written educational materials related to hypo and hyperglycemia and importance of correct treatment Reviewed scheduled/upcoming provider appointments  Review of patient status, including review of consultants reports, relevant laboratory and other test results, and medications completed. Called to [facility] to schedule patient appointment, office was closed, message left for return call to patient  Discussed the importance of eating 3 small meals a day, including protein and vegetable Provided examples of meals and snacks Provided education on hyperglycemia, instructed patient to call provider [place] [phone] if recheck >400 Self-Care Activities - Self administers oral medications as prescribed Attends all scheduled provider appointments Checks blood sugars as prescribed and utilize hyper and hypoglycemia protocol as needed Adheres to prescribed ADA/carb  modified Patient Goals: - adhere to a diabetic diet, eat three meals a day, including protein with each meal - check blood sugar at prescribed times - check blood sugar if I feel it is too high or too  low - enter blood sugar readings and medication or insulin into daily log - take the blood sugar log to all doctor visits - take the blood sugar meter to all doctor visits  Follow Up Plan: Telephone follow up appointment with care management team member scheduled for:09/30/20 @ 9:00am     Patient Care Plan: Medication management     Problem Identified: Health Promotion or Disease Self-Management (General Plan of Care)      Goal: Medication Management   Note:   Current Barriers:  Unable to self administer medications as prescribed Does not adhere to prescribed medication regimen Does not maintain contact with provider office Does not contact provider office for questions/concerns   Pharmacist Clinical Goal(s):  Over the next 30 days, patient will contact provider office for questions/concerns as evidenced notation of same in electronic health record through collaboration with PharmD and provider.    Interventions: Inter-disciplinary care team collaboration (see longitudinal plan of care) Comprehensive medication review performed; medication list updated in electronic medical record  _0 @ _1 @ _2 @  Patient Goals/Self-Care Activities Over the next 30 days, patient will:  - collaborate with provider on medication access solutions  Follow Up Plan: The care management team will reach out to the patient again over the next 30 days.

## 2020-09-19 ENCOUNTER — Encounter: Payer: Self-pay | Admitting: *Deleted

## 2020-09-24 ENCOUNTER — Encounter: Payer: Self-pay | Admitting: Family Medicine

## 2020-09-24 DIAGNOSIS — R808 Other proteinuria: Secondary | ICD-10-CM | POA: Diagnosis not present

## 2020-09-24 DIAGNOSIS — N189 Chronic kidney disease, unspecified: Secondary | ICD-10-CM | POA: Diagnosis not present

## 2020-09-24 DIAGNOSIS — I129 Hypertensive chronic kidney disease with stage 1 through stage 4 chronic kidney disease, or unspecified chronic kidney disease: Secondary | ICD-10-CM | POA: Diagnosis not present

## 2020-09-24 DIAGNOSIS — E211 Secondary hyperparathyroidism, not elsewhere classified: Secondary | ICD-10-CM | POA: Diagnosis not present

## 2020-09-24 DIAGNOSIS — N041 Nephrotic syndrome with focal and segmental glomerular lesions: Secondary | ICD-10-CM | POA: Diagnosis not present

## 2020-09-24 DIAGNOSIS — E1122 Type 2 diabetes mellitus with diabetic chronic kidney disease: Secondary | ICD-10-CM | POA: Diagnosis not present

## 2020-09-24 NOTE — Assessment & Plan Note (Signed)
Inc glipizide dose to 10 mg daily Latoya Walsh is reminded of the importance of commitment to daily physical activity for 30 minutes or more, as able and the need to limit carbohydrate intake to 30 to 60 grams per meal to help with blood sugar control.   The need to take medication as prescribed, test blood sugar as directed, and to call between visits if there is a concern that blood sugar is uncontrolled is also discussed.   Latoya Walsh is reminded of the importance of daily foot exam, annual eye examination, and good blood sugar, blood pressure and cholesterol control.  Diabetic Labs Latest Ref Rng & Units 09/17/2020 09/17/2020 09/17/2020 09/17/2020 06/10/2020  HbA1c 4.0 - 5.6 % 8.5(A) 8.5(A) 8.5 8.5(A) -  Microalbumin mg/dL - - - - -  Micro/Creat Ratio 0.0 - 30.0 mg/g - - - - -  Chol 100 - 199 mg/dL - - - - -  HDL >39 mg/dL - - - - -  Calc LDL 0 - 99 mg/dL - - - - -  Triglycerides 0 - 149 mg/dL - - - - -  Creatinine 0.44 - 1.00 mg/dL - - - - 2.81(H)   BP/Weight 09/17/2020 09/11/2020 09/05/2020 07/25/2020 07/18/2020 06/26/2020 [id number]  Systolic BP [id number]  Diastolic BP 65 68 70 80 84 88 80  Wt. (Lbs) 165 161 156.4 151 150 159 163.14  BMI 25.84 25.22 24.5 23.65 23.49 24.9 25.55   Foot/eye exam completion dates Latest Ref Rng & Units 09/17/2020 03/05/2020  Eye Exam No Retinopathy No Retinopathy -  Foot Form Completion - - Done

## 2020-09-24 NOTE — Assessment & Plan Note (Signed)
Ongoing challenge but she will be supported and encouraged as best able

## 2020-09-24 NOTE — Assessment & Plan Note (Signed)
Hyperlipidemia:Low fat diet discussed and encouraged.   Lipid Panel  Lab Results  Component Value Date   CHOL 167 05/02/2020   HDL 34 (L) 05/02/2020   LDLCALC 87 05/02/2020   TRIG 277 (H) 05/02/2020   CHOLHDL 4.9 (H) 05/02/2020   Updated lab needed at/ before next visit. uncontrolled

## 2020-09-24 NOTE — Assessment & Plan Note (Signed)
Inadequately treated significant psychosocial challenges , receiving as much support as she will take, not suicidal or homcidal, med adherence a challenge also

## 2020-09-24 NOTE — Assessment & Plan Note (Signed)
Asked:confirms currently smokes cigarettes °Assess: Unwilling to set a quit date, but is cutting back °Advise: needs to QUIT to reduce risk of cancer, cardio and cerebrovascular disease °Assist: counseled for 5 minutes and literature provided °Arrange: follow up in 2 to 4 months ° °

## 2020-09-26 ENCOUNTER — Other Ambulatory Visit: Payer: Self-pay

## 2020-09-26 ENCOUNTER — Ambulatory Visit (HOSPITAL_COMMUNITY)
Admission: RE | Admit: 2020-09-26 | Discharge: 2020-09-26 | Disposition: A | Discharge status: Home / Self Care | Payer: Medicaid Other | Source: Ambulatory Visit | Attending: Student | Admitting: Student

## 2020-09-26 DIAGNOSIS — I5022 Chronic systolic (congestive) heart failure: Secondary | ICD-10-CM

## 2020-09-26 LAB — ECHOCARDIOGRAM COMPLETE
Area-P 1/2: 4.49 cm2
Calc EF: 47.8 %
MV M vel: 4.29 m/s
MV Peak grad: 73.6 mmHg
S' Lateral: 4.1 cm
Single Plane A2C EF: 55.9 %
Single Plane A4C EF: 39.7 %

## 2020-09-26 NOTE — Progress Notes (Signed)
*  PRELIMINARY RESULTS* Echocardiogram 2D Echocardiogram has been performed.  Latoya Walsh 09/26/2020, 12:27 PM

## 2020-09-30 ENCOUNTER — Other Ambulatory Visit: Payer: Self-pay

## 2020-09-30 ENCOUNTER — Telehealth: Payer: Self-pay | Admitting: Licensed Clinical Social Worker

## 2020-09-30 ENCOUNTER — Other Ambulatory Visit: Payer: Self-pay | Admitting: *Deleted

## 2020-09-30 NOTE — Telephone Encounter (Signed)
Received message from Orin, [place], w/ [facility] who is inquiring about scale request for pt. This was previously passed on by this writer on August 31, 2022 to Genia Del, CMA, who could make request to [facility] clinic  leadership to order that DME. LCSW f/u again with Caryl Pina to see if this is still possible/to ensure one had not already been provided to pt.   I remain available as needed.   Westley Hummer, MSW, [place]  [phone]

## 2020-09-30 NOTE — Patient Instructions (Signed)
Visit Information  Ms. Lodato was given information about Medicaid Managed Care team care coordination services as a part of their [place] [employer] benefit. Latoya Walsh verbally consented to engagement with the [facility] Managed Care team.   For questions related to your [facility], please call: [phone] or visit the homepage here: [email or web address]  If you would like to schedule transportation through your [facility], please call the following number at least 2 days in advance of your appointment: [phone].   Call the [place] at [phone], at any time, 24 hours a day, 7 days a week. If you are in danger or need immediate medical attention call 911.  If you would like help to quit smoking, call 1-800-QUIT-NOW 908-488-5609) OR Espaol: 1-855-Djelo-Ya (8-882-800-3491) o para ms informacin haga clic aqu or Text READY to 200-400 to register via text  [name] - following are the goals we discussed in your visit today:   Goals Addressed             This Visit's Progress    Monitor and Manage My Blood Sugar-Diabetes Type 2       Timeframe:  Long-Range Goal Priority:  Medium Start Date:  08/09/20                           Expected End Date:  11/09/20                     Follow Up Date  10/15/20   - attend appointment on 7/13 with [facility] [phone]  - adhere to a diabetic diet, eat three meals a day, including protein with each meal - check blood sugar at prescribed times - check blood sugar if I feel it is too high or too low - call your provider with abnormal readings - enter blood sugar readings and medication or insulin into daily log - take the blood sugar log to all doctor visits - take the blood sugar meter to all doctor visits    Why is this important?   Checking your blood sugar at home  helps to keep it from getting very high or very low.  Writing the results in a diary or log helps the doctor know how to care for you.  Your blood sugar log should have the time, date and the results.  Also, write down the amount of insulin or other medicine that you take.  Other information, like what you ate, exercise done and how you were feeling, will also be helpful.           Track and Manage Fluids and Swelling-Heart Failure       Timeframe:  Long-Range Goal Priority:  High Start Date:    08/09/20                         Expected End Date:    11/09/20                   Follow Up Date 10/15/20   - call office if I gain more than 2 pounds in one day or 5 pounds in one week - use salt in moderation - watch for swelling in feet, ankles and legs every day - weigh myself daily    Why is this important?   It is important to check your weight daily and  watch how much salt and liquids you have.  It will help you to manage your heart failure.          Track and Manage Symptoms-Heart Failure       Timeframe:  Long-Range Goal Priority:  High Start Date:   08/09/20                          Expected End Date:    11/09/20                   Follow Up Date 10/15/20    - replace chips with a healthy snack like nuts - eat more whole grains, fruits and vegetables, lean meats and healthy fats - know when to call the doctor - track symptoms and what helps feel better or worse - dress right for the weather, hot or cold    Why is this important?   You will be able to handle your symptoms better if you keep track of them.  Making some simple changes to your lifestyle will help.  Eating healthy is one thing you can do to take good care of yourself.             Please see education materials related to diabetes and CHF provided as print materials.   The patient verbalized understanding of instructions provided today and agreed to receive a mailed copy of patient instruction and/or  educational materials.  Telephone follow up appointment with Managed Medicaid care management team member scheduled for:10/15/20 @ [place] RN, [place] RN Care Coordinator   Following is a copy of your plan of care:  Patient Care Plan: Heart Failure (Adult)     Problem Identified: Symptom Exacerbation (Heart Failure)      Long-Range Goal: Symptom Exacerbation Prevented or Minimized   Start Date: 08/09/2020  Expected End Date: 11/08/2020  Recent Progress: On track  Priority: High  Note:   Current Barriers:  Knowledge deficit related to basic heart failure pathophysiology and self care management-Ms. Palau is managing her health with the assistance of an Aide. Today patient was alert, but confused at times and requested RNCM to speak with her Aide. Ms. Petrea was not at home and was unable to review her medications. She has recently began receiving Paramedic support for medication management. [name] is unsure of medications. She is taking her "bubble packs" 3 times a day and is part of the Paramedicine program. [name] comes out every Friday and checks BP and BS.-Update-Ms. Stipes reports taking all of her mediation as packaged, but is concerned that her medications are making her very sleepy. She is aware of upcoming appointments and has arranged for her Aide to take her. She has not received a scale. Knowledge Deficits related to heart failure medications Patient does not have readable scale Literacy Barriers Financial strain Cognitive Deficits Unable to self administer medications as prescribed Does not adhere to provider recommendations re:  Does not contact provider office for questions/concerns Case Manager Clinical Goal(s):  patient will weigh self daily and record patient will verbalize understanding of Heart Failure Action Plan and when to call doctor patient will take all Heart Failure mediations as prescribed patient will weigh  daily and record (notifying MD of 3 lb weight gain over night or 5 lb in a week) Interventions:  Discussed completed echo, provider called her to review results.  Collaborate with provider re: scale Provided  encouragement Basic overview and discussion of pathophysiology of Heart Failure reviewed  Provided verbal education on low sodium diet Provided written education on low sodium diet Discussed importance of daily weight and advised patient to weigh and record daily Collaborate with [facility] Pharmacist, Ovid Curd re: medications making patient so sleepy Patient Goals/Self-Care Activities - attend scheduled appointments - replace chips with a healthy snack like nuts - eat more whole grains, fruits and vegetables, lean meats and healthy fats - know when to call the doctor - track symptoms and what helps feel better or worse - dress right for the weather, hot or cold  - call office if I gain more than 2 pounds in one day or 5 pounds in one week - use salt in moderation - watch for swelling in feet, ankles and legs every day - weigh myself daily  Follow Up Plan: Telephone follow up appointment with care management team member scheduled for:10/15/20 @ 9:00am     Patient Care Plan: Diabetes Type 2 (Adult)     Problem Identified: Glycemic Management (Diabetes, Type 2)      Long-Range Goal: Glycemic Management Optimized   Start Date: 08/09/2020  Expected End Date: 11/08/2020  Recent Progress: On track  Priority: Medium  Note:   Objective:  Lab Results  Component Value Date   HGBA1C 8.5 (A) 09/17/2020   HGBA1C 8.5 09/17/2020   HGBA1C 8.5 (A) 09/17/2020   HGBA1C 8.5 (A) 09/17/2020   Lab Results  Component Value Date   CREATININE 2.81 (H) 06/10/2020   CREATININE 2.53 (H) 06/09/2020   CREATININE 2.54 (H) 06/08/2020   No results found for: EGFR Current Barriers:  Knowledge Deficits related to basic Diabetes pathophysiology and self care/management-RNCM reviewed note in patients chart  regarding elevated blood sugars and PCP would like for patient to be seen this week. Patient reports checking her BS daily, yesterday reading was 202. Patient with decreased appetite and unclear about what she is eating. Patient was evaluated and referral to Endocrinology was placed.-Update-Ms. Schnoebelen has an Endocrinology appointment on 7/13. She has arranged for her Aide to take her to the visit. She is checking her BS each morning, yesterday's reading was 136.  Knowledge Deficits related to medications used for management of diabetes Difficulty obtaining or cannot afford medications Does not have glucometer to monitor blood sugar Financial Constraints Cognitive Deficits Unable to self administer medications as prescribed Does not contact provider office for questions/concerns Case Manager Clinical Goal(s):  patient will demonstrate improved adherence to prescribed treatment plan for diabetes self care/management as evidenced by: daily monitoring and recording of CBG,  adherence to ADA/ carb modified diet, adherence to prescribed medication regimen, contacting provider for new or worsened symptoms or questions.   Interventions:  Inter-disciplinary care team collaboration (see longitudinal plan of care) Provided education to patient about basic DM disease process Discussed plans with patient for ongoing care management follow up and provided patient with direct contact information for care management team Provided patient with written educational materials related to diabetic nutrition Reviewed scheduled/upcoming provider appointments  Review of patient status, including review of consultants reports, relevant laboratory and other test results, and medications completed. Discussed the importance of eating 3 small meals a day, including protein and vegetable Provided examples of meals and snacks Will follow closely with frequent telephone visits Self-Care Activities - Self administers oral  medications as prescribed Attends all scheduled provider appointments Checks blood sugars as prescribed and utilize hyper and hypoglycemia protocol as needed Adheres to prescribed ADA/carb  modified Patient Goals: - adhere to a diabetic diet, eat three meals a day, including protein with each meal - check blood sugar at prescribed times - check blood sugar if I feel it is too high or too low - enter blood sugar readings and medication or insulin into daily log - take the blood sugar log to all doctor visits - take the blood sugar meter to all doctor visits  Follow Up Plan: Telephone follow up appointment with care management team member scheduled for:10/15/20 @ 9:00am     Patient Care Plan: Medication management     Problem Identified: Health Promotion or Disease Self-Management (General Plan of Care)      Goal: Medication Management   Note:   Current Barriers:  Unable to self administer medications as prescribed Does not adhere to prescribed medication regimen Does not maintain contact with provider office Does not contact provider office for questions/concerns   Pharmacist Clinical Goal(s):  Over the next 30 days, patient will contact provider office for questions/concerns as evidenced notation of same in electronic health record through collaboration with PharmD and provider.    Interventions: Inter-disciplinary care team collaboration (see longitudinal plan of care) Comprehensive medication review performed; medication list updated in electronic medical record  @RXCPDIABETES @ @RXCPHYPERTENSION @ @RXCPHYPERLIPIDEMIA @  Patient Goals/Self-Care Activities Over the next 30 days, patient will:  - collaborate with provider on medication access solutions  Follow Up Plan: The care management team will reach out to the patient again over the next 30 days.

## 2020-09-30 NOTE — Patient Outreach (Signed)
Medicaid Managed Care   Nurse Care Manager Note  09/30/2020 Name:  Latoya Walsh MRN:  [id number] DOB:  11/13/1960  Latoya Walsh is an 60 y.o. year old female who is a primary patient of Latoya Helper, MD.  The [facility] Managed Care Coordination team was consulted for assistance with:    CHF DMII  Latoya Walsh was given information about Medicaid Managed Care Coordination team services today. Latoya Walsh agreed to services and verbal consent obtained.  Engaged with patient by telephone for follow up visit in response to provider referral for case management and/or care coordination services.   Assessments/Interventions:  Review of past medical history, allergies, medications, health status, including review of consultants reports, laboratory and other test data, was performed as part of comprehensive evaluation and provision of chronic care management services.  SDOH (Social Determinants of Health) assessments and interventions performed:   Care Plan  Allergies  Allergen Reactions   Penicillins Itching and Rash    Medications Reviewed Today     Reviewed by Latoya Helper, MD (Physician) on 09/24/20 at [place] List Status: <None>   Medication Order Taking? Sig Documenting Provider Last Dose Status Informant  amLODipine (NORVASC) 5 MG tablet [id number] Yes Take 1 tablet (5 mg total) by mouth daily. Latoya Lenis, MD Taking Active   blood glucose meter kit and supplies [id number] Yes Dispense based on patient and insurance preference. Once daily testing dx E11.9. Latoya Helper, MD Taking Active   ergocalciferol (VITAMIN D2) 1.25 MG (50000 UT) capsule [id number] Yes Take 1 capsule (50,000 Units total) by mouth once a week. One capsule once weekly Latoya Spar, MD Taking Active   fluticasone [facility]) 50 MCG/ACT nasal spray [id number] Yes Place 1 spray into both nostrils daily. Latoya Dubois, MD Taking Active Self  furosemide (LASIX) 40 MG tablet  [id number] Yes Take 1 tablet (40 mg total) by mouth 2 (two) times daily. Latoya Lenis, MD Taking Active   gabapentin (NEURONTIN) 100 MG capsule [id number] Yes Take 1 capsule (100 mg total) by mouth 3 (three) times daily. Latoya Larsson, NP Taking Active   glipiZIDE (GLUCOTROL XL) 10 MG 24 hr tablet [id number] Yes Take two tablets by mouth once daily with breakfast Latoya Helper, MD  Active   hydrALAZINE (APRESOLINE) 50 MG tablet [id number] Yes Take 1 tablet (50 mg total) by mouth every 8 (eight) hours. Latoya Spar, MD Taking Active   isosorbide mononitrate (IMDUR) 30 MG 24 hr tablet [id number] Yes Take 1 tablet (30 mg total) by mouth daily. Latoya Lenis, MD Taking Active   losartan (COZAAR) 25 MG tablet [id number] Yes Take 1 tablet (25 mg total) by mouth daily. Latoya Lenis, MD Taking Active   metoprolol succinate (TOPROL-XL) 50 MG 24 hr tablet [id number] Yes Take 1 tablet (50 mg total) by mouth daily. Take with or immediately following a meal. Branch, [name], MD Taking Active   mirtazapine (REMERON) 15 MG tablet [id number] Yes Take 1 tablet (15 mg total) by mouth at bedtime. Latoya Spar, MD Taking Active   rosuvastatin (CRESTOR) 10 MG tablet [id number] Yes Take 1 tablet (10 mg total) by mouth daily. Latoya Lenis, MD Taking Active   sodium bicarbonate 650 MG tablet [id number] Yes Take 650 mg by mouth 2 (two) times daily. [provider] Taking Active   Med List Note Latoya Walsh, [place] 80/32/12 2482):  Patient Active Problem List   Diagnosis Date Noted   Acute CHF (congestive heart failure) ([place]) 06/06/2020   Leg swelling 04/28/2020   Hyperlipidemia LDL goal <100 04/28/2020   Left hand pain 03/10/2020   Anemia 02/19/2020   Depression, major, single episode, in partial remission ([place]) 01/25/2019   Headache 11/13/2018   Insomnia 09/25/2018   Screening for colorectal cancer 07/23/2016   Lumbar back pain with radiculopathy affecting  right lower extremity 03/27/2015   Non compliance with medical treatment 07/22/2014   Chronic hepatitis C without hepatic coma ([place]) 12/04/2013   Allergic rhinitis 11/03/2012   Severe hypertension 06/25/2011   Anxiety and depression 02/05/2011   Nicotine dependence 11/01/2010   Drug dependence, continuous abuse ([place]) 03/22/2010   Uncontrolled type 2 diabetes mellitus with hyperglycemia ([place]) 06/02/2007   Alcohol abuse 06/02/2007    Conditions to be addressed/monitored per PCP order:  CHF and DMII  Care Plan : Heart Failure (Adult)  Updates made by [name], RN since 09/30/2020 12:00 AM     Problem: Symptom Exacerbation (Heart Failure)      Long-Range Goal: Symptom Exacerbation Prevented or Minimized   Start Date: 08/09/2020  Expected End Date: 11/08/2020  Recent Progress: On track  Priority: High  Note:   Current Barriers:  Knowledge deficit related to basic heart failure pathophysiology and self care management-Ms. Ton is managing her health with the assistance of an Aide. Today patient was alert, but confused at times and requested RNCM to speak with her Aide. Ms. Giusto was not at home and was unable to review her medications. She has recently began receiving Paramedic support for medication management. [name] is unsure of medications. She is taking her "bubble packs" 3 times a day and is part of the Paramedicine program. [name] comes out every Friday and checks BP and BS.-Update-Ms. Dorsainvil reports taking all of her mediation as packaged, but is concerned that her medications are making her very sleepy. She is aware of upcoming appointments and has arranged for her Aide to take her. She has not received a scale. Knowledge Deficits related to heart failure medications Patient does not have readable scale Literacy Barriers Financial strain Cognitive Deficits Unable to self administer medications as prescribed Does not adhere to provider recommendations re:  Does not  contact provider office for questions/concerns Case Manager Clinical Goal(s):  patient will weigh self daily and record patient will verbalize understanding of Heart Failure Action Plan and when to call doctor patient will take all Heart Failure mediations as prescribed patient will weigh daily and record (notifying MD of 3 lb weight gain over night or 5 lb in a week) Interventions:  Discussed completed echo, provider called her to review results.  Collaborate with provider re: scale Provided encouragement Basic overview and discussion of pathophysiology of Heart Failure reviewed  Provided verbal education on low sodium diet Provided written education on low sodium diet Discussed importance of daily weight and advised patient to weigh and record daily Collaborate with MM Pharmacist, Ovid Curd re: medications making patient so sleepy Patient Goals/Self-Care Activities - attend scheduled appointments - replace chips with a healthy snack like nuts - eat more whole grains, fruits and vegetables, lean meats and healthy fats - know when to call the doctor - track symptoms and what helps feel better or worse - dress right for the weather, hot or cold  - call office if I gain more than 2 pounds in one day or 5 pounds in one week - use  salt in moderation - watch for swelling in feet, ankles and legs every day - weigh myself daily  Follow Up Plan: Telephone follow up appointment with care management team member scheduled for:10/15/20 @ 9:00am     Care Plan : Diabetes Type 2 (Adult)  Updates made by Mailyn Montane, RN since 09/30/2020 12:00 AM     Problem: Glycemic Management (Diabetes, Type 2)      Long-Range Goal: Glycemic Management Optimized   Start Date: 08/09/2020  Expected End Date: 11/08/2020  Recent Progress: On track  Priority: Medium  Note:   Objective:  Lab Results  Component Value Date   HGBA1C 8.5 (A) 09/17/2020   HGBA1C 8.5 09/17/2020   HGBA1C 8.5 (A) 09/17/2020    HGBA1C 8.5 (A) 09/17/2020   Lab Results  Component Value Date   CREATININE 2.81 (H) 06/10/2020   CREATININE 2.53 (H) 06/09/2020   CREATININE 2.54 (H) 06/08/2020   No results found for: EGFR Current Barriers:  Knowledge Deficits related to basic Diabetes pathophysiology and self care/management-RNCM reviewed note in patients chart regarding elevated blood sugars and PCP would like for patient to be seen this week. Patient reports checking her BS daily, yesterday reading was 202. Patient with decreased appetite and unclear about what she is eating. Patient was evaluated and referral to Endocrinology was placed.-Update-Ms. Ciccarelli has an Endocrinology appointment on 7/13. She has arranged for her Aide to take her to the visit. She is checking her BS each morning, yesterday's reading was 136.  Knowledge Deficits related to medications used for management of diabetes Difficulty obtaining or cannot afford medications Does not have glucometer to monitor blood sugar Financial Constraints Cognitive Deficits Unable to self administer medications as prescribed Does not contact provider office for questions/concerns Case Manager Clinical Goal(s):  patient will demonstrate improved adherence to prescribed treatment plan for diabetes self care/management as evidenced by: daily monitoring and recording of CBG,  adherence to ADA/ carb modified diet, adherence to prescribed medication regimen, contacting provider for new or worsened symptoms or questions.   Interventions:  Inter-disciplinary care team collaboration (see longitudinal plan of care) Provided education to patient about basic DM disease process Discussed plans with patient for ongoing care management follow up and provided patient with direct contact information for care management team Provided patient with written educational materials related to diabetic nutrition Reviewed scheduled/upcoming provider appointments  Review of patient status,  including review of consultants reports, relevant laboratory and other test results, and medications completed. Discussed the importance of eating 3 small meals a day, including protein and vegetable Provided examples of meals and snacks Will follow closely with frequent telephone visits Self-Care Activities - Self administers oral medications as prescribed Attends all scheduled provider appointments Checks blood sugars as prescribed and utilize hyper and hypoglycemia protocol as needed Adheres to prescribed ADA/carb modified Patient Goals: - adhere to a diabetic diet, eat three meals a day, including protein with each meal - check blood sugar at prescribed times - check blood sugar if I feel it is too high or too low - enter blood sugar readings and medication or insulin into daily log - take the blood sugar log to all doctor visits - take the blood sugar meter to all doctor visits  Follow Up Plan: Telephone follow up appointment with care management team member scheduled for:10/15/20 @ 9:00am      Follow Up:  Patient agrees to Care Plan and Follow-up.  Plan: The Managed Medicaid care management team will reach out to  the patient again over the next 14 days.  Date/time of next scheduled RN care management/care coordination outreach:  10/15/20 @ [place] RN, [place] RN Care Coordinator

## 2020-10-01 ENCOUNTER — Other Ambulatory Visit: Payer: Medicaid Other

## 2020-10-01 NOTE — Patient Outreach (Signed)
Patient states she is getting sleeping in morning and lunch-time.   Could be hypotension or hypoglycemia.  -States sugars are from 255 so most likely not hypoglycemia.  -Most likely Hydralazine  States Cards is in 2 month. Recommended she see Cardio ASAP to check. She is unable to check BP herself. Will schedule appt for early morning or lunch time to match lethargy

## 2020-10-02 ENCOUNTER — Encounter: Payer: Self-pay | Admitting: Nurse Practitioner

## 2020-10-02 ENCOUNTER — Ambulatory Visit (INDEPENDENT_AMBULATORY_CARE_PROVIDER_SITE_OTHER): Payer: Medicaid Other | Admitting: Nurse Practitioner

## 2020-10-02 ENCOUNTER — Other Ambulatory Visit: Payer: Self-pay | Admitting: Family Medicine

## 2020-10-02 VITALS — BP 163/76 | HR 74 | Ht 67.0 in | Wt 173.0 lb

## 2020-10-02 DIAGNOSIS — I10 Essential (primary) hypertension: Secondary | ICD-10-CM | POA: Diagnosis not present

## 2020-10-02 DIAGNOSIS — E1165 Type 2 diabetes mellitus with hyperglycemia: Secondary | ICD-10-CM | POA: Diagnosis not present

## 2020-10-02 DIAGNOSIS — F5104 Psychophysiologic insomnia: Secondary | ICD-10-CM

## 2020-10-02 DIAGNOSIS — E782 Mixed hyperlipidemia: Secondary | ICD-10-CM | POA: Diagnosis not present

## 2020-10-02 NOTE — Progress Notes (Signed)
Endocrinology Consult Note       10/02/2020, 9:36 AM   Subjective:    Patient ID: Latoya Walsh, female    DOB: [birth year].  Latoya Walsh is being seen in consultation for management of currently uncontrolled symptomatic diabetes requested by  [name], MD.   Past Medical History:  Diagnosis Date   Allergic rhinitis    Anxiety    CHF (congestive heart failure) ([place])    a. EF 30-35% by echo in 05/2020   Chronic back pain    Chronic hepatitis C without hepatic coma ([place])    COVID-19 virus infection 12/26/2019   Depression    Diabetes mellitus    Hepatitis C    Hypertension    Noncompliance    Poor appetite 07/17/2014   Substance abuse ([place])    HX of drug use and alcohol use    Past Surgical History:  Procedure Laterality Date   APPENDECTOMY  2004    Social History   Socioeconomic History   Marital status: Single    Spouse name: Not on file   Number of children: 3   Years of education: Not on file   Highest education level: Not on file  Occupational History   Occupation: Disabled  Tobacco Use   Smoking status: Light Smoker    Packs/day: 0.25    Pack years: 0.00    Types: Cigarettes   Smokeless tobacco: Never  Vaping Use   Vaping Use: Never used  Substance and Sexual Activity   Alcohol use: Not Currently    Alcohol/week: 40.0 standard drinks    Types: 40 Cans of beer per week    Comment: 2 16oz cans of beer a day   Drug use: Not Currently    Types: Marijuana, Cocaine    Comment: Last smoked marijuana yesterday 10/18/14   Sexual activity: Yes    Birth control/protection: Condom  Other Topics Concern   Not on file  Social History Narrative   Not on file   Social Determinants of Health   Financial Resource Strain: Low Risk    Difficulty of Paying Living Expenses: Not hard at all  Food Insecurity: No Food Insecurity   Worried About Charity fundraiser in the Last  Year: Never true   Arboriculturist in the Last Year: Never true  Transportation Needs: No Transportation Needs   Lack of Transportation (Medical): No   Lack of Transportation (Non-Medical): No  Physical Activity: Not on file  Stress: Not on file  Social Connections: Not on file    Family History  Problem Relation Age of Onset   Diabetes Sister        Twin - AIDS     Outpatient Encounter Medications as of 10/02/2020  Medication Sig   amLODipine (NORVASC) 5 MG tablet TAKE ONE TABLET BY MOUTH ONCE DAILY.   calcitRIOL (ROCALTROL) 0.25 MCG capsule Take 0.25 mcg by mouth daily.   furosemide (LASIX) 40 MG tablet Take 1 tablet (40 mg total) by mouth 2 (two) times daily.   gabapentin (NEURONTIN) 100 MG capsule Take 1 capsule (100 mg total) by mouth 3 (three) times daily.   glipiZIDE (GLUCOTROL XL)  5 MG 24 hr tablet Take 5 mg by mouth daily with breakfast.   hydrALAZINE (APRESOLINE) 50 MG tablet Take 1 tablet (50 mg total) by mouth every 8 (eight) hours.   isosorbide mononitrate (IMDUR) 30 MG 24 hr tablet Take 1 tablet (30 mg total) by mouth daily.   metoprolol succinate (TOPROL-XL) 50 MG 24 hr tablet Take 1 tablet (50 mg total) by mouth daily. Take with or immediately following a meal.   mirtazapine (REMERON) 15 MG tablet TAKE (1) TABLET BY MOUTH AT BEDTIME.   rosuvastatin (CRESTOR) 10 MG tablet Take 1 tablet (10 mg total) by mouth daily.   sodium bicarbonate 650 MG tablet Take 650 mg by mouth 2 (two) times daily.   blood glucose meter kit and supplies Dispense based on patient and insurance preference. Once daily testing dx E11.9.   [DISCONTINUED] amLODipine (NORVASC) 5 MG tablet Take 1 tablet (5 mg total) by mouth daily.   [DISCONTINUED] ergocalciferol (VITAMIN D2) 1.25 MG (50000 UT) capsule Take 1 capsule (50,000 Units total) by mouth once a week. One capsule once weekly   [DISCONTINUED] fluticasone (FLONASE) 50 MCG/ACT nasal spray Place 1 spray into both nostrils daily.   [DISCONTINUED]  glipiZIDE (GLUCOTROL XL) 10 MG 24 hr tablet Take two tablets by mouth once daily with breakfast   [DISCONTINUED] losartan (COZAAR) 25 MG tablet Take 1 tablet (25 mg total) by mouth daily.   [DISCONTINUED] mirtazapine (REMERON) 15 MG tablet Take 1 tablet (15 mg total) by mouth at bedtime.   No facility-administered encounter medications on file as of 10/02/2020.    ALLERGIES: Allergies  Allergen Reactions   Penicillins Itching and Rash    VACCINATION STATUS: Immunization History  Administered Date(s) Administered   H1N1 04/04/2008   Influenza Split 02/05/2011, 12/02/2011   Influenza Whole 12/10/2006, 01/08/2009   Influenza,inj,Quad PF,6+ Mos 02/12/2014, 03/27/2015, 03/05/2020   PFIZER(Purple Top)SARS-COV-2 Vaccination 09/29/2019   Pneumococcal Conjugate-13 06/11/2014   Pneumococcal Polysaccharide-23 12/05/2004, 10/30/2010   Td 08/09/2003    Diabetes She presents for her initial diabetic visit. She has type 2 diabetes mellitus. Onset time: Diagnosed at approx age of 48. Her disease course has been stable. Hypoglycemia symptoms include nervousness/anxiousness and sweats. Associated symptoms include blurred vision, fatigue and polyuria. Pertinent negatives for diabetes include no weight loss. There are no hypoglycemic complications. Symptoms are stable. Diabetic complications include heart disease and nephropathy. Risk factors for coronary artery disease include diabetes mellitus, dyslipidemia, hypertension, sedentary lifestyle and tobacco exposure. Current diabetic treatment includes oral agent (monotherapy). She is compliant with treatment most of the time. Her weight is increasing steadily. She is following a generally unhealthy diet. When asked about meal planning, she reported none. She has not had a previous visit with a dietitian. She rarely participates in exercise. Her breakfast blood glucose range is generally 140-180 mg/dl. Her overall blood glucose range is 180-200 mg/dl. (She  presents today for her consultation with no meter or logs to review.  Her most recent A1c was 8.5% on 09/17/20.  She is a poor historian.  She reports she monitors her glucose once daily, before breakfast with her reading today in the 170s.  She does report frequent drops in her glucose throughout the day.  When we were talking about her meal routine, she stated she doesn't have much to eat as she has run out of food stamps for the time being.  She does report she drinks water and juices.  She does not engage in routine physical activity outside of her  daily activities around the house.) An ACE inhibitor/angiotensin II receptor blocker is being taken. She does not see a podiatrist.Eye exam is not current.  Hypertension This is a chronic problem. The current episode started more than 1 year ago. The problem has been waxing and waning since onset. The problem is uncontrolled. Associated symptoms include blurred vision and sweats. There are no associated agents to hypertension. Risk factors for coronary artery disease include diabetes mellitus, dyslipidemia, family history, sedentary lifestyle and smoking/tobacco exposure. Past treatments include calcium channel blockers, diuretics, angiotensin blockers, beta blockers and direct vasodilators. The current treatment provides moderate improvement. Compliance problems include diet, exercise and psychosocial issues.  Hypertensive end-organ damage includes kidney disease and heart failure. Identifiable causes of hypertension include chronic renal disease.  Hyperlipidemia This is a chronic problem. The current episode started more than 1 year ago. The problem is uncontrolled. Recent lipid tests were reviewed and are variable. Exacerbating diseases include chronic renal disease and diabetes. Factors aggravating her hyperlipidemia include beta blockers, fatty foods and smoking. Current antihyperlipidemic treatment includes statins. The current treatment provides mild  improvement of lipids. Compliance problems include adherence to diet, adherence to exercise and psychosocial issues.  Risk factors for coronary artery disease include diabetes mellitus, dyslipidemia, family history, hypertension and a sedentary lifestyle.    Review of systems  Constitutional: + steadily increasing body weight, current Body mass index is 27.1 kg/m., + fatigue, no subjective hyperthermia, no subjective hypothermia Eyes: + blurry vision, no xerophthalmia ENT: no sore throat, no nodules palpated in throat, no dysphagia/odynophagia, no hoarseness Cardiovascular: no chest pain, no shortness of breath, no palpitations, + leg swelling Respiratory: no cough, no shortness of breath Gastrointestinal: no nausea/vomiting/diarrhea Musculoskeletal: no muscle/joint aches Skin: no rashes, no hyperemia Neurological: no tremors, no numbness, no tingling, no dizziness Psychiatric: no depression, no anxiety  Objective:     BP (!) 163/76   Pulse 74   Ht _0  (1.702 m)   Wt 173 lb (78.5 kg)   LMP 06/23/2010   BMI 27.10 kg/m   Wt Readings from Last 3 Encounters:  10/02/20 173 lb (78.5 kg)  09/17/20 165 lb (74.8 kg)  09/11/20 161 lb (73 kg)     BP Readings from Last 3 Encounters:  10/02/20 (!) 163/76  09/17/20 139/65  09/11/20 122/68     Physical Exam- Limited  Constitutional:  Body mass index is 27.1 kg/m. , not in acute distress, normal state of mind, avoids eye contact Eyes:  EOMI, no exophthalmos Neck: Supple Cardiovascular: RRR, no murmurs, rubs, or gallops, 1+ pitting edema to BLE Respiratory: Adequate breathing efforts, no crackles, rales, rhonchi, or wheezing Musculoskeletal: no gross deformities, strength intact in all four extremities, no gross restriction of joint movements Skin:  no rashes, no hyperemia Neurological: no tremor with outstretched hands    CMP ( most recent) CMP     Component Value Date/Time   NA 140 06/10/2020 0501   NA 144 05/02/2020 1535    K 3.9 06/10/2020 0501   CL 106 06/10/2020 0501   CO2 24 06/10/2020 0501   GLUCOSE 124 (H) 06/10/2020 0501   BUN 35 (H) 06/10/2020 0501   BUN 38 (H) 05/02/2020 1535   CREATININE 2.81 (H) 06/10/2020 0501   CREATININE 1.49 (H) 03/28/2019 1056   CALCIUM 8.2 (L) 06/10/2020 0501   PROT 6.1 (L) 06/06/2020 1447   PROT 6.2 05/02/2020 1535   ALBUMIN 2.7 (L) 06/06/2020 1447   ALBUMIN 3.3 (L) 05/02/2020 1535   AST 26  06/06/2020 1447   ALT 20 06/06/2020 1447   ALT 65 (H) 12/04/2013 1449   ALKPHOS 87 06/06/2020 1447   BILITOT 0.5 06/06/2020 1447   BILITOT <0.2 05/02/2020 1535   BILITOT 0.3 12/04/2013 1449   GFRNONAA 19 (L) 06/10/2020 0501   GFRNONAA 39 (L) 03/28/2019 1056   GFRAA 31 (L) 05/02/2020 1535   GFRAA 45 (L) 03/28/2019 1056     Diabetic Labs (most recent): Lab Results  Component Value Date   HGBA1C 8.5 (A) 09/17/2020   HGBA1C 8.5 09/17/2020   HGBA1C 8.5 (A) 09/17/2020   HGBA1C 8.5 (A) 09/17/2020     Lipid Panel ( most recent) Lipid Panel     Component Value Date/Time   CHOL 167 05/02/2020 1535   TRIG 277 (H) 05/02/2020 1535   HDL 34 (L) 05/02/2020 1535   CHOLHDL 4.9 (H) 05/02/2020 1535   CHOLHDL 4.8 03/28/2019 1056   VLDL 14 10/02/2016 0821   LDLCALC 87 05/02/2020 1535   LDLCALC 136 (H) 03/28/2019 1056   LABVLDL 46 (H) 05/02/2020 1535      Lab Results  Component Value Date   TSH 1.673 06/07/2020   TSH 2.160 01/05/2020   TSH 2.32 09/27/2018   TSH 1.99 10/02/2016   TSH 1.238 04/22/2015   TSH 1.419 02/19/2014   TSH 2.567 07/11/2012   TSH 2.052 10/30/2010   TSH 1.170 09/14/2008   TSH 1.172 09/16/2007           Assessment & Plan:   1) Uncontrolled type 2 diabetes mellitus with hyperglycemia ([place])  She presents today for her consultation with no meter or logs to review.  Her most recent A1c was 8.5% on 09/17/20.  She is a poor historian.  She reports she monitors her glucose once daily, before breakfast with her reading today in the 170s.  She does  report frequent drops in her glucose throughout the day.  When we were talking about her meal routine, she stated she doesn't have much to eat as she has run out of food stamps for the time being.  She does report she drinks water and juices.  She does not engage in routine physical activity outside of her daily activities around the house.  - Latoya Walsh has currently uncontrolled symptomatic type 2 DM since 60 years of age, with most recent A1c of 8.5 %.   -Recent labs reviewed.  - I had a long discussion with her about the progressive nature of diabetes and the pathology behind its complications. -her diabetes is complicated by noncompliance, CHF, CKD stage 4, ETOH abuse and she remains at a high risk for more acute and chronic complications which include CAD, CVA, CKD, retinopathy, and neuropathy. These are all discussed in detail with her.  - I have counseled her on diet and weight management by adopting a carbohydrate restricted/protein rich diet. Patient is encouraged to switch to unprocessed or minimally processed complex starch and increased protein intake (animal or plant source), fruits, and vegetables. -  she is advised to stick to a routine mealtimes to eat 3 meals a day and avoid unnecessary snacks (to snack only to correct hypoglycemia).   - she acknowledges that there is a room for improvement in her food and drink choices. - Suggestion is made for her to avoid simple carbohydrates from her diet including Cakes, Sweet Desserts, Ice Cream, Soda (diet and regular), Sweet Tea, Candies, Chips, Cookies, Store Bought Juices, Alcohol in Excess of 1-2 drinks a day, Artificial Sweeteners, Coffee  Creamer, and "Sugar-free" Products. This will help patient to have more stable blood glucose profile and potentially avoid unintended weight gain.  - I have approached her with the following individualized plan to manage her diabetes and patient agrees:   -she is encouraged to start monitoring  glucose 4 times daily, before meals and before bed, to log their readings on the clinic sheets provided, and bring them to review at follow up appointment in 2 weeks.  - she is warned not to take insulin without proper monitoring per orders. - Adjustment parameters are given to her for hypo and hyperglycemia in writing. - she is encouraged to call clinic for blood glucose levels less than 70 or above 300 mg /dl. - she is advised to continue Glipizide 5 mg XL daily with breakfast, therapeutically suitable for patient .  We discussed the possibility that she will need to start on at least basal insulin (given her history of ETOH abuse) in the future to control her diabetes and she is open to the idea.  - she is not a candidate for Metformin or SGLT2i due to concurrent renal insufficiency.  - she is not a good candidate for incretin therapy due to body habitus.  - Specific targets for  A1c; LDL, HDL, and Triglycerides were discussed with the patient.  2) Blood Pressure /Hypertension:  her blood pressure is not controlled to target.  She had just taken her medications prior to her appointment today.   she is advised to continue her current medications including Norvasc 5 mg p.o. daily with breakfast, Lasix 40 mg po daily, Hydralazine 50 mg po TID, and Losartan 25 mg po daily.  3) Lipids/Hyperlipidemia:    Review of her recent lipid panel from 05/02/20 showed controlled LDL at 87 with elevated triglycerides of 277 .  she is advised to continue Crestor 10 mg daily at bedtime.  Side effects and precautions discussed with her.  4)  Weight/Diet:  her Body mass index is 27.1 kg/m.  -    she is not a candidate for major weight loss.  Exercise, and detailed carbohydrates information provided  -  detailed on discharge instructions.  5) Chronic Care/Health Maintenance: -she is on ACEI/ARB and Statin medications and is encouraged to initiate and continue to follow up with Ophthalmology, Dentist, Podiatrist  at least yearly or according to recommendations, and advised to [facility] [place]. I have recommended yearly flu vaccine and pneumonia vaccine at least every 5 years; moderate intensity exercise for up to 150 minutes weekly; and sleep for at least 7 hours a day.  - she is advised to maintain close follow up with [name], MD for primary care needs, as well as her other providers for optimal and coordinated care.   - Time spent in this patient care: 60 min, of which > 50% was spent in counseling her about her diabetes and the rest reviewing her blood glucose logs, discussing her hypoglycemia and hyperglycemia episodes, reviewing her current and previous labs/studies (including abstraction from other facilities) and medications doses and developing a long term treatment plan based on the latest standards of care/guidelines; and documenting her care.    Please refer to Patient Instructions for Blood Glucose Monitoring and Insulin/Medications Dosing Guide" in media tab for additional information. Please also refer to "Patient Self Inventory" in the Media tab for reviewed elements of pertinent patient history.  [name] participated in the discussions, expressed understanding, and voiced agreement with the above plans.  All questions  were answered to her satisfaction. she is encouraged to contact clinic should she have any questions or concerns prior to her return visit.     Follow up plan: - Return in about 2 weeks (around 10/16/2020) for Diabetes F/U, Bring meter and logs, No previsit labs, ABI next visit.    Rayetta Pigg, [facility] [address] [place], [place] [address] Phone: [phone] Fax: [phone]  10/02/2020, 9:36 AM

## 2020-10-02 NOTE — Patient Instructions (Signed)
Diabetes Mellitus and Nutrition, Adult When you have diabetes, or diabetes mellitus, it is very important to have healthy eating habits because your blood sugar (glucose) levels are greatly affected by what you eat and drink. Eating healthy foods in the right amounts, at about the same times every day, can help you:  Control your blood glucose.  Lower your risk of heart disease.  Improve your blood pressure.  Reach or maintain a healthy weight. What can affect my meal plan? Every person with diabetes is different, and each person has different needs for a meal plan. Your health care provider may recommend that you work with a dietitian to make a meal plan that is best for you. Your meal plan may vary depending on factors such as:  The calories you need.  The medicines you take.  Your weight.  Your blood glucose, blood pressure, and cholesterol levels.  Your activity level.  Other health conditions you have, such as heart or kidney disease. How do carbohydrates affect me? Carbohydrates, also called carbs, affect your blood glucose level more than any other type of food. Eating carbs naturally raises the amount of glucose in your blood. Carb counting is a method for keeping track of how many carbs you eat. Counting carbs is important to keep your blood glucose at a healthy level, especially if you use insulin or take certain oral diabetes medicines. It is important to know how many carbs you can safely have in each meal. This is different for every person. Your dietitian can help you calculate how many carbs you should have at each meal and for each snack. How does alcohol affect me? Alcohol can cause a sudden decrease in blood glucose (hypoglycemia), especially if you use insulin or take certain oral diabetes medicines. Hypoglycemia can be a life-threatening condition. Symptoms of hypoglycemia, such as sleepiness, dizziness, and confusion, are similar to symptoms of having too much  alcohol.  Do not drink alcohol if: ? Your health care provider tells you not to drink. ? You are pregnant, may be pregnant, or are planning to become pregnant.  If you drink alcohol: ? Do not drink on an empty stomach. ? Limit how much you use to:  0-1 drink a day for women.  0-2 drinks a day for men. ? Be aware of how much alcohol is in your drink. In the [place] one drink equals one 12 oz bottle of beer (355 mL), one 5 oz glass of wine (148 mL), or one 1 oz glass of hard liquor (44 mL). ? Keep yourself hydrated with water, diet soda, or unsweetened iced tea.  Keep in mind that regular soda, juice, and other mixers may contain a lot of sugar and must be counted as carbs. What are tips for following this plan? Reading food labels  Start by checking the serving size on the "Nutrition Facts" label of packaged foods and drinks. The amount of calories, carbs, fats, and other nutrients listed on the label is based on one serving of the item. Many items contain more than one serving per package.  Check the total grams (g) of carbs in one serving. You can calculate the number of servings of carbs in one serving by dividing the total carbs by 15. For example, if a food has 30 g of total carbs per serving, it would be equal to 2 servings of carbs.  Check the number of grams (g) of saturated fats and trans fats in one serving. Choose foods that have   a low amount or none of these fats.  Check the number of milligrams (mg) of salt (sodium) in one serving. Most people should limit total sodium intake to less than 2,300 mg per day.  Always check the nutrition information of foods labeled as "low-fat" or "nonfat." These foods may be higher in added sugar or refined carbs and should be avoided.  Talk to your dietitian to identify your daily goals for nutrients listed on the label. Shopping  Avoid buying canned, pre-made, or processed foods. These foods tend to be high in fat, sodium, and added  sugar.  Shop around the outside edge of the grocery store. This is where you will most often find fresh fruits and vegetables, bulk grains, fresh meats, and fresh dairy. Cooking  Use low-heat cooking methods, such as baking, instead of high-heat cooking methods like deep frying.  Cook using healthy oils, such as olive, canola, or sunflower oil.  Avoid cooking with butter, cream, or high-fat meats. Meal planning  Eat meals and snacks regularly, preferably at the same times every day. Avoid going long periods of time without eating.  Eat foods that are high in fiber, such as fresh fruits, vegetables, beans, and whole grains. Talk with your dietitian about how many servings of carbs you can eat at each meal.  Eat 4-6 oz (112-168 g) of lean protein each day, such as lean meat, chicken, fish, eggs, or tofu. One ounce (oz) of lean protein is equal to: ? 1 oz (28 g) of meat, chicken, or fish. ? 1 egg. ?  cup (62 g) of tofu.  Eat some foods each day that contain healthy fats, such as avocado, nuts, seeds, and fish.   What foods should I eat? Fruits Berries. Apples. Oranges. Peaches. Apricots. Plums. Grapes. Mango. Papaya. Pomegranate. Kiwi. Cherries. Vegetables Lettuce. Spinach. Leafy greens, including kale, chard, collard greens, and mustard greens. Beets. Cauliflower. Cabbage. Broccoli. Carrots. Green beans. Tomatoes. Peppers. Onions. Cucumbers. Brussels sprouts. Grains Whole grains, such as whole-wheat or whole-grain bread, crackers, tortillas, cereal, and pasta. Unsweetened oatmeal. Quinoa. Brown or wild rice. Meats and other proteins Seafood. Poultry without skin. Lean cuts of poultry and beef. Tofu. Nuts. Seeds. Dairy Low-fat or fat-free dairy products such as milk, yogurt, and cheese. The items listed above may not be a complete list of foods and beverages you can eat. Contact a dietitian for more information. What foods should I avoid? Fruits Fruits canned with  syrup. Vegetables Canned vegetables. Frozen vegetables with butter or cream sauce. Grains Refined white flour and flour products such as bread, pasta, snack foods, and cereals. Avoid all processed foods. Meats and other proteins Fatty cuts of meat. Poultry with skin. Breaded or fried meats. Processed meat. Avoid saturated fats. Dairy Full-fat yogurt, cheese, or milk. Beverages Sweetened drinks, such as soda or iced tea. The items listed above may not be a complete list of foods and beverages you should avoid. Contact a dietitian for more information. Questions to ask a health care provider  Do I need to meet with a diabetes educator?  Do I need to meet with a dietitian?  What number can I call if I have questions?  When are the best times to check my blood glucose? Where to find more information:  American Diabetes Association: diabetes.org  Academy of Nutrition and Dietetics: www.eatright.org  National Institute of Diabetes and Digestive and Kidney Diseases: www.niddk.nih.gov  Association of Diabetes Care and Education Specialists: www.diabeteseducator.org Summary  It is important to have healthy eating   habits because your blood sugar (glucose) levels are greatly affected by what you eat and drink.  A healthy meal plan will help you control your blood glucose and maintain a healthy lifestyle.  Your health care provider may recommend that you work with a dietitian to make a meal plan that is best for you.  Keep in mind that carbohydrates (carbs) and alcohol have immediate effects on your blood glucose levels. It is important to count carbs and to use alcohol carefully. This information is not intended to replace advice given to you by your health care provider. Make sure you discuss any questions you have with your health care provider. Document Revised: 02/14/2019 Document Reviewed: 02/14/2019 Elsevier Patient Education  2021 Elsevier Inc.  

## 2020-10-03 ENCOUNTER — Telehealth: Payer: Self-pay | Admitting: Licensed Clinical Social Worker

## 2020-10-03 DIAGNOSIS — D638 Anemia in other chronic diseases classified elsewhere: Secondary | ICD-10-CM | POA: Diagnosis not present

## 2020-10-03 DIAGNOSIS — E1122 Type 2 diabetes mellitus with diabetic chronic kidney disease: Secondary | ICD-10-CM | POA: Diagnosis not present

## 2020-10-03 DIAGNOSIS — R808 Other proteinuria: Secondary | ICD-10-CM | POA: Diagnosis not present

## 2020-10-03 DIAGNOSIS — N189 Chronic kidney disease, unspecified: Secondary | ICD-10-CM | POA: Diagnosis not present

## 2020-10-03 DIAGNOSIS — E211 Secondary hyperparathyroidism, not elsewhere classified: Secondary | ICD-10-CM | POA: Diagnosis not present

## 2020-10-03 DIAGNOSIS — I5023 Acute on chronic systolic (congestive) heart failure: Secondary | ICD-10-CM | POA: Diagnosis not present

## 2020-10-03 DIAGNOSIS — N041 Nephrotic syndrome with focal and segmental glomerular lesions: Secondary | ICD-10-CM | POA: Diagnosis not present

## 2020-10-03 NOTE — Telephone Encounter (Signed)
LCSW called pt paramedic [name] to see if she would be able to assist [place] with getting scale to pt next week when it has arrived at the office. No answer, message left.   Westley Hummer, MSW, [place]  [phone]

## 2020-10-03 NOTE — Telephone Encounter (Signed)
Spoke with [place], she will get scale from [facility] clinic in [place] on Tuesday 7/19. No additional concerns voiced by paramedic at this time. She states pt is doing alright, has been more diligent about taking her medications. [name] continues to support pt in the home.   Westley Hummer, MSW, [place]  [phone]

## 2020-10-11 NOTE — Patient Outreach (Signed)
Sent direct msg to PCP asking for TS/Lancets and Furosemide PA

## 2020-10-14 ENCOUNTER — Telehealth: Payer: Self-pay | Admitting: Licensed Clinical Social Worker

## 2020-10-14 NOTE — Telephone Encounter (Signed)
[facility] paramedic [place] confirmed she was able to pick up scale from [facility] office and deliver to pt. I will also update [facility] RN.   Westley Hummer, MSW, [place]  [phone]

## 2020-10-15 ENCOUNTER — Other Ambulatory Visit: Payer: Self-pay

## 2020-10-15 ENCOUNTER — Other Ambulatory Visit: Payer: Self-pay | Admitting: *Deleted

## 2020-10-15 NOTE — Patient Outreach (Signed)
Sent another direct msg to PCP regarding Furosemide, the '40mg'$  and she is taking 2 tabs in the morning and 1 tab in the evening, but her insurance will not cover anything more than 2 tabs daily without a PA

## 2020-10-15 NOTE — Patient Outreach (Signed)
Medicaid Managed Care   Nurse Care Manager Note  10/15/2020 Name:  Latoya Walsh MRN:  [id number] DOB:  11-08-1960  Latoya Walsh is an 60 y.o. year old female who is a primary patient of [name], MD.  The [facility] Managed Care Coordination team was consulted for assistance with:    CHF DMII  Latoya Walsh was given information about Medicaid Managed Care Coordination team services today. [name] agreed to services and verbal consent obtained.  Engaged with patient by telephone for follow up visit in response to provider referral for case management and/or care coordination services.   Assessments/Interventions:  Review of past medical history, allergies, medications, health status, including review of consultants reports, laboratory and other test data, was performed as part of comprehensive evaluation and provision of chronic care management services.  SDOH (Social Determinants of Health) assessments and interventions performed: SDOH Interventions    Flowsheet Row Most Recent Value  SDOH Interventions   Food Insecurity Interventions Patient Refused  Latoya Walsh will go to a local food pantry today]       Care Plan  Allergies  Allergen Reactions   Penicillins Itching and Rash    Medications Reviewed Today     Reviewed by [name], NP (Nurse Practitioner) on 10/02/20 at [place] List Status: <None>   Medication Order Taking? Sig Documenting Provider Last Dose Status Informant  amLODipine (NORVASC) 5 MG tablet [id number] Yes TAKE ONE TABLET BY MOUTH ONCE DAILY. [name], MD Taking Active   blood glucose meter kit and supplies [id number]  Dispense based on patient and insurance preference. Once daily testing dx E11.9. [name], MD  Active   calcitRIOL (ROCALTROL) 0.25 MCG capsule [id number] Yes Take 0.25 mcg by mouth daily. [provider] Taking Active   furosemide (LASIX) 40 MG tablet [id number] Yes Take 1 tablet (40  mg total) by mouth 2 (two) times daily. [name], MD Taking Active   gabapentin (NEURONTIN) 100 MG capsule [id number] Yes Take 1 capsule (100 mg total) by mouth 3 (three) times daily. [name], NP Taking Active   glipiZIDE (GLUCOTROL XL) 5 MG 24 hr tablet [id number] Yes Take 5 mg by mouth daily with breakfast. [provider] Taking Active   hydrALAZINE (APRESOLINE) 50 MG tablet [id number] Yes Take 1 tablet (50 mg total) by mouth every 8 (eight) hours. [name], MD Taking Active   isosorbide mononitrate (IMDUR) 30 MG 24 hr tablet [id number] Yes Take 1 tablet (30 mg total) by mouth daily. [name], MD Taking Active   metoprolol succinate (TOPROL-XL) 50 MG 24 hr tablet [id number] Yes Take 1 tablet (50 mg total) by mouth daily. Take with or immediately following a meal. [name], MD Taking Active   mirtazapine (REMERON) 15 MG tablet [id number] Yes TAKE (1) TABLET BY MOUTH AT BEDTIME. [name], MD Taking Active   rosuvastatin (CRESTOR) 10 MG tablet [id number] Yes Take 1 tablet (10 mg total) by mouth daily. [name], MD Taking Active   sodium bicarbonate 650 MG tablet [id number] Yes Take 650 mg by mouth 2 (two) times daily. [provider] Taking Active   Med List Note [name], [place] [birth year] 5176):              Patient Active Problem List   Diagnosis Date Noted   Acute CHF (congestive heart failure) ([place]) 06/06/2020   Leg swelling 04/28/2020   Hyperlipidemia LDL goal <100  04/28/2020   Left hand pain 03/10/2020   Anemia 02/19/2020   Depression, major, single episode, in partial remission ([place]) 01/25/2019   Headache 11/13/2018   Insomnia 09/25/2018   Screening for colorectal cancer 07/23/2016   Lumbar back pain with radiculopathy affecting right lower extremity 03/27/2015   Non compliance with medical treatment 07/22/2014   Chronic hepatitis C without hepatic coma ([place]) 12/04/2013   Allergic rhinitis  11/03/2012   Severe hypertension 06/25/2011   Anxiety and depression 02/05/2011   Nicotine dependence 11/01/2010   Drug dependence, continuous abuse ([place]) 03/22/2010   Uncontrolled type 2 diabetes mellitus with hyperglycemia ([place]) 06/02/2007   Alcohol abuse 06/02/2007    Conditions to be addressed/monitored per PCP order:  CHF and DMII  Care Plan : Heart Failure (Adult)  Updates made by Latoya Montane, RN since 10/15/2020 12:00 AM     Problem: Symptom Exacerbation (Heart Failure)      Long-Range Goal: Symptom Exacerbation Prevented or Minimized   Start Date: 08/09/2020  Expected End Date: 11/08/2020  Recent Progress: On track  Priority: High  Note:   Current Barriers:  Knowledge deficit related to basic heart failure pathophysiology and self care management-Latoya Walsh is managing her health with the assistance of an Aide. Today patient was alert, but confused at times and requested RNCM to speak with her Aide. Latoya Walsh was not at home and was unable to review her medications. She has recently began receiving Paramedic support for medication management. Latoya Walsh is unsure of medications. She is taking her "bubble packs" 3 times a day and is part of the Paramedicine program. Latoya Walsh comes out every Friday and checks BP and BS.-Update-Latoya Walsh continues to feeling "sleepy" after taking her medications.  Latoya Walsh did receive a scale, but is not weighing daily. She is trying to get some exercise by walking up and down the road, but reports back pain afterward. Latoya Walsh is aware of local food pantries and plans to go later today.  Knowledge Deficits related to heart failure medications Patient does not have readable scale Literacy Barriers Financial strain Cognitive Deficits Unable to self administer medications as prescribed Does not adhere to provider recommendations re:  Does not contact provider office for questions/concerns Case Manager Clinical Goal(s):  patient will weigh self  daily and record patient will verbalize understanding of Heart Failure Action Plan and when to call doctor patient will take all Heart Failure mediations as prescribed patient will weigh daily and record (notifying MD of 3 lb weight gain over night or 5 lb in a week) Interventions:   Reviewed upcoming appointments: 8/1-Endocrinology, 8/4-Cardiology, and 8/11 with Urology-Patient aware and plans for her Aide to take her to these appointments Provided encouragement Basic overview and discussion of pathophysiology of Heart Failure reviewed  Provided written information on diabetic and dash diet Discussed importance of daily weight and advised patient to weigh and record daily, call provider with weight gain of 3# in one day or 5# in a week Provided therapeutic listening Advised patient to wear a supportive shoe while exercising-do not wear flip flops Patient Goals/Self-Care Activities - attend scheduled appointments - replace chips with a healthy snack like nuts - eat more whole grains, fruits and vegetables, lean meats and healthy fats - know when to call the doctor - track symptoms and what helps feel better or worse - dress right for the weather, hot or cold  - call office if I gain more than 2 pounds in one day or 5  pounds in one week - use salt in moderation - watch for swelling in feet, ankles and legs every day - weigh myself daily  Follow Up Plan: Telephone follow up appointment with care management team member scheduled for:10/28/20 @ 10:00am     Care Plan : Diabetes Type 2 (Adult)  Updates made by Haydee Montane, RN since 10/15/2020 12:00 AM     Problem: Glycemic Management (Diabetes, Type 2)      Long-Range Goal: Glycemic Management Optimized   Start Date: 08/09/2020  Expected End Date: 11/08/2020  Recent Progress: On track  Priority: Medium  Note:   Objective:  Lab Results  Component Value Date   HGBA1C 8.5 (A) 09/17/2020   HGBA1C 8.5 09/17/2020   HGBA1C 8.5 (A)  09/17/2020   HGBA1C 8.5 (A) 09/17/2020   Lab Results  Component Value Date   CREATININE 2.81 (H) 06/10/2020   CREATININE 2.53 (H) 06/09/2020   CREATININE 2.54 (H) 06/08/2020   No results found for: EGFR Current Barriers:  Knowledge Deficits related to basic Diabetes pathophysiology and self care/management-RNCM reviewed note in patients chart regarding elevated blood sugars and PCP would like for patient to be seen this week. Patient reports checking her BS daily, yesterday reading was 202. Patient with decreased appetite and unclear about what she is eating. Patient was evaluated and referral to Endocrinology was placed.-Update-Latoya Walsh ran out of test strips and is unable to check her blood sugar. Per patient, [name](Paramedic) will bring some on Friday. She plans to go to the local food pantry today. She is aware of her follow up appointment with Endocrinology and has transportation arranged. Ms. Duddy has started attending a bible study on Monday and Thursday evenings. Knowledge Deficits related to medications used for management of diabetes Difficulty obtaining or cannot afford medications Does not have glucometer to monitor blood sugar Financial Constraints Cognitive Deficits Unable to self administer medications as prescribed Does not contact provider office for questions/concerns Case Manager Clinical Goal(s):  patient will demonstrate improved adherence to prescribed treatment plan for diabetes self care/management as evidenced by: daily monitoring and recording of CBG,  adherence to ADA/ carb modified diet, adherence to prescribed medication regimen, contacting provider for new or worsened symptoms or questions.   Interventions:  Inter-disciplinary care team collaboration (see longitudinal plan of care) Provided education to patient about basic DM disease process Discussed plans with patient for ongoing care management follow up and provided patient with direct contact information  for care management team Provided patient with written educational materials related to diabetic nutrition Reviewed upcoming appointments: 8/1-Endocrinology, 8/4-Cardiology, and 8/11 with Urology-Patient aware and plans for her Aide to take her to these appointments Review of patient status, including review of consultants reports, relevant laboratory and other test results, and medications completed. Discussed the importance of eating 3 small meals a day, including protein and vegetables Reviewed instructions from Endocrinologist Will follow closely with frequent telephone visits Self-Care Activities - Self administers oral medications as prescribed Attends all scheduled provider appointments Checks blood sugars as prescribed and utilize hyper and hypoglycemia protocol as needed Adheres to prescribed ADA/carb modified Patient Goals: - adhere to a diabetic diet, eat three meals a day, including protein with each meal - check blood sugar at prescribed times-4 times a day - check blood sugar if I feel it is too high or too low - enter blood sugar readings and medication or insulin into daily log - take the blood sugar log to all doctor visits - take the  blood sugar meter to all doctor visits  Follow Up Plan: Telephone follow up appointment with care management team member scheduled for:10/28/20 @ 10:00am      Follow Up:  Patient agrees to Care Plan and Follow-up.  Plan: The Managed Medicaid care management team will reach out to the patient again over the next 14 days.  Date/time of next scheduled RN care management/care coordination outreach:  10/28/20 @ 10am  [name] RN, [place] RN Care Coordinator

## 2020-10-15 NOTE — Patient Instructions (Signed)
Visit Information  Ms. Durkin was given information about Medicaid Managed Care team care coordination services as a part of their [place] Medicaid benefit. [name] verbally consented to engagement with the [facility] Managed Care team.   If you are experiencing a medical emergency, please call 911 or report to your local emergency department or urgent care.   If you have a non-emergency medical problem during routine business hours, please contact your provider's office and ask to speak with a nurse.   For questions related to your [facility], please call: [phone] or visit the homepage here: [email or web address]  If you would like to schedule transportation through your [facility], please call the following number at least 2 days in advance of your appointment: [phone].   Call the [facility] at [phone], at any time, 24 hours a day, 7 days a week. If you are in danger or need immediate medical attention call 911.  If you would like help to quit smoking, call 1-800-QUIT-NOW 5190359053) OR Espaol: 1-855-Djelo-Ya (3-382-505-3976) o para ms informacin haga clic aqu or Text READY to 200-400 to register via text  Ms. [name] - following are the goals we discussed in your visit today:   Goals Addressed             This Visit's Progress    Monitor and Manage My Blood Sugar-Diabetes Type 2       Timeframe:  Long-Range Goal Priority:  Medium Start Date:  08/09/20                           Expected End Date:  11/09/20                     Follow Up Date  10/28/20   - attend appointments on 8/1 with Endocrinology, 8/4 with Cardiology and 8/11 with Urology - adhere to a diabetic diet, eat three meals a day, including protein with each meal - check blood sugar at prescribed times - check blood sugar if I feel it is too high  or too low - call your provider with abnormal readings - enter blood sugar readings and medication or insulin into daily log - take the blood sugar log to all doctor visits - take the blood sugar meter to all doctor visits  - increase exercise to 30 min a day/5 days a week   Why is this important?   Checking your blood sugar at home helps to keep it from getting very high or very low.  Writing the results in a diary or log helps the doctor know how to care for you.  Your blood sugar log should have the time, date and the results.  Also, write down the amount of insulin or other medicine that you take.  Other information, like what you ate, exercise done and how you were feeling, will also be helpful.          Track and Manage Fluids and Swelling-Heart Failure       Timeframe:  Long-Range Goal Priority:  High Start Date:    08/09/20                         Expected End Date:    11/09/20                   Follow Up  Date 10/28/20   - weigh at the same time each day - call office if I gain more than 2 pounds in one day or 5 pounds in one week - use salt in moderation - watch for swelling in feet, ankles and legs every day - call provider with any concerns or questions     Why is this important?   It is important to check your weight daily and watch how much salt and liquids you have.  It will help you to manage your heart failure.         Track and Manage Symptoms-Heart Failure       Timeframe:  Long-Range Goal Priority:  High Start Date:   08/09/20                          Expected End Date:    11/09/20                   Follow Up Date 10/28/20    - work on cutting out smoking completely - replace chips with a healthy snack like nuts - eat more whole grains, fruits and vegetables, lean meats and healthy fats - know when to call the doctor - track symptoms and what helps feel better or worse - dress right for the weather, hot or cold    Why is this important?   You will be  able to handle your symptoms better if you keep track of them.  Making some simple changes to your lifestyle will help.  Eating healthy is one thing you can do to take good care of yourself.            Please see education materials related to diabetes and heart failure provided as print materials.   The patient verbalized understanding of instructions provided today and agreed to receive a mailed copy of patient instruction and/or educational materials.  Telephone follow up appointment with Managed Medicaid care management team member scheduled for:10/28/20 @ [place] RN, BSN [facility] RN Care Coordinator   Following is a copy of your plan of care:  Patient Care Plan: Heart Failure (Adult)     Problem Identified: Symptom Exacerbation (Heart Failure)      Long-Range Goal: Symptom Exacerbation Prevented or Minimized   Start Date: 08/09/2020  Expected End Date: 11/08/2020  Recent Progress: On track  Priority: High  Note:   Current Barriers:  Knowledge deficit related to basic heart failure pathophysiology and self care management-Ms. Lwin is managing her health with the assistance of an Aide. Today patient was alert, but confused at times and requested RNCM to speak with her Aide. Ms. Diefendorf was not at home and was unable to review her medications. She has recently began receiving Paramedic support for medication management. [name] is unsure of medications. She is taking her "bubble packs" 3 times a day and is part of the Paramedicine program. [name] comes out every Friday and checks BP and BS.-Update-Ms. Frith continues to feeling "sleepy" after taking her medications.  Ms. Emile did receive a scale, but is not weighing daily. She is trying to get some exercise by walking up and down the road, but reports back pain afterward. Ms. Horiuchi is aware of local food pantries and plans to go later today.  Knowledge Deficits related to heart failure  medications Patient does not have readable scale Literacy Barriers Financial strain Cognitive Deficits Unable to  self administer medications as prescribed Does not adhere to provider recommendations re:  Does not contact provider office for questions/concerns Case Manager Clinical Goal(s):  patient will weigh self daily and record patient will verbalize understanding of Heart Failure Action Plan and when to call doctor patient will take all Heart Failure mediations as prescribed patient will weigh daily and record (notifying MD of 3 lb weight gain over night or 5 lb in a week) Interventions:   Reviewed upcoming appointments: 8/1-Endocrinology, 8/4-Cardiology, and 8/11 with Urology-Patient aware and plans for her Aide to take her to these appointments Provided encouragement Basic overview and discussion of pathophysiology of Heart Failure reviewed  Provided written information on diabetic and dash diet Discussed importance of daily weight and advised patient to weigh and record daily, call provider with weight gain of 3# in one day or 5# in a week Provided therapeutic listening Advised patient to wear a supportive shoe while exercising-do not wear flip flops Patient Goals/Self-Care Activities - attend scheduled appointments - replace chips with a healthy snack like nuts - eat more whole grains, fruits and vegetables, lean meats and healthy fats - know when to call the doctor - track symptoms and what helps feel better or worse - dress right for the weather, hot or cold  - call office if I gain more than 2 pounds in one day or 5 pounds in one week - use salt in moderation - watch for swelling in feet, ankles and legs every day - weigh myself daily  Follow Up Plan: Telephone follow up appointment with care management team member scheduled for:10/28/20 @ 10:00am     Patient Care Plan: Diabetes Type 2 (Adult)     Problem Identified: Glycemic Management (Diabetes, Type 2)       Long-Range Goal: Glycemic Management Optimized   Start Date: 08/09/2020  Expected End Date: 11/08/2020  Recent Progress: On track  Priority: Medium  Note:   Objective:  Lab Results  Component Value Date   HGBA1C 8.5 (A) 09/17/2020   HGBA1C 8.5 09/17/2020   HGBA1C 8.5 (A) 09/17/2020   HGBA1C 8.5 (A) 09/17/2020   Lab Results  Component Value Date   CREATININE 2.81 (H) 06/10/2020   CREATININE 2.53 (H) 06/09/2020   CREATININE 2.54 (H) 06/08/2020   No results found for: EGFR Current Barriers:  Knowledge Deficits related to basic Diabetes pathophysiology and self care/management-RNCM reviewed note in patients chart regarding elevated blood sugars and PCP would like for patient to be seen this week. Patient reports checking her BS daily, yesterday reading was 202. Patient with decreased appetite and unclear about what she is eating. Patient was evaluated and referral to Endocrinology was placed.-Update-Ms. Zetino ran out of test strips and is unable to check her blood sugar. Per patient, [name](Paramedic) will bring some on Friday. She plans to go to the local food pantry today. She is aware of her follow up appointment with Endocrinology and has transportation arranged. Ms. Capri has started attending a bible study on Monday and Thursday evenings. Knowledge Deficits related to medications used for management of diabetes Difficulty obtaining or cannot afford medications Does not have glucometer to monitor blood sugar Financial Constraints Cognitive Deficits Unable to self administer medications as prescribed Does not contact provider office for questions/concerns Case Manager Clinical Goal(s):  patient will demonstrate improved adherence to prescribed treatment plan for diabetes self care/management as evidenced by: daily monitoring and recording of CBG,  adherence to ADA/ carb modified diet, adherence to prescribed medication regimen, contacting  provider for new or worsened symptoms or  questions.   Interventions:  Inter-disciplinary care team collaboration (see longitudinal plan of care) Provided education to patient about basic DM disease process Discussed plans with patient for ongoing care management follow up and provided patient with direct contact information for care management team Provided patient with written educational materials related to diabetic nutrition Reviewed upcoming appointments: 8/1-Endocrinology, 8/4-Cardiology, and 8/11 with Urology-Patient aware and plans for her Aide to take her to these appointments Review of patient status, including review of consultants reports, relevant laboratory and other test results, and medications completed. Discussed the importance of eating 3 small meals a day, including protein and vegetables Reviewed instructions from Endocrinologist Will follow closely with frequent telephone visits Self-Care Activities - Self administers oral medications as prescribed Attends all scheduled provider appointments Checks blood sugars as prescribed and utilize hyper and hypoglycemia protocol as needed Adheres to prescribed ADA/carb modified Patient Goals: - adhere to a diabetic diet, eat three meals a day, including protein with each meal - check blood sugar at prescribed times-4 times a day - check blood sugar if I feel it is too high or too low - enter blood sugar readings and medication or insulin into daily log - take the blood sugar log to all doctor visits - take the blood sugar meter to all doctor visits  Follow Up Plan: Telephone follow up appointment with care management team member scheduled for:10/28/20 @ 10:00am     Patient Care Plan: Medication management     Problem Identified: Health Promotion or Disease Self-Management (General Plan of Care)      Goal: Medication Management   Note:   Current Barriers:  Unable to self administer medications as prescribed Does not adhere to prescribed medication regimen Does  not maintain contact with provider office Does not contact provider office for questions/concerns   Pharmacist Clinical Goal(s):  Over the next 30 days, patient will contact provider office for questions/concerns as evidenced notation of same in electronic health record through collaboration with PharmD and provider.    Interventions: Inter-disciplinary care team collaboration (see longitudinal plan of care) Comprehensive medication review performed; medication list updated in electronic medical record    Patient Goals/Self-Care Activities Over the next 30 days, patient will:  - collaborate with provider on medication access solutions  Follow Up Plan: The care management team will reach out to the patient again over the next 30 days.

## 2020-10-16 ENCOUNTER — Telehealth: Payer: Self-pay

## 2020-10-16 MED ORDER — FUROSEMIDE 80 MG PO TABS: ORAL_TABLET | ORAL | 3 refills | Status: DC

## 2020-10-16 NOTE — Telephone Encounter (Signed)
Received message from [place], [facility] regarding pt's furosemide and the insurance's refusal to pay for it without the prescription being updated. Medication updated to reflect changes.

## 2020-10-18 ENCOUNTER — Telehealth: Payer: Self-pay | Admitting: Licensed Clinical Social Worker

## 2020-10-18 NOTE — Patient Instructions (Signed)

## 2020-10-18 NOTE — Telephone Encounter (Signed)
LCSW received message from [facility], [employer].  Pt has two upcoming appointments she needs transportation assistance to next week. [name] able to take her to appointment on the 1st; I have called and gotten pt scheduled for ride on 8/4 to [employer] with Christian, Solicitor. I requested he let me know if he is unable to get in touch with pt next week to confirm ride.   Westley Hummer, MSW, [place]  [phone]

## 2020-10-21 ENCOUNTER — Other Ambulatory Visit: Payer: Self-pay

## 2020-10-21 ENCOUNTER — Encounter: Payer: Medicaid Other | Admitting: Nurse Practitioner

## 2020-10-21 DIAGNOSIS — E1165 Type 2 diabetes mellitus with hyperglycemia: Secondary | ICD-10-CM

## 2020-10-21 DIAGNOSIS — I10 Essential (primary) hypertension: Secondary | ICD-10-CM

## 2020-10-21 DIAGNOSIS — E782 Mixed hyperlipidemia: Secondary | ICD-10-CM

## 2020-10-24 ENCOUNTER — Encounter: Payer: Self-pay | Admitting: Student

## 2020-10-24 ENCOUNTER — Ambulatory Visit (INDEPENDENT_AMBULATORY_CARE_PROVIDER_SITE_OTHER): Payer: Medicaid Other | Admitting: Student

## 2020-10-24 ENCOUNTER — Other Ambulatory Visit: Payer: Self-pay

## 2020-10-24 VITALS — BP 132/80 | HR 74 | Ht 67.0 in | Wt 175.0 lb

## 2020-10-24 DIAGNOSIS — E782 Mixed hyperlipidemia: Secondary | ICD-10-CM

## 2020-10-24 DIAGNOSIS — I5022 Chronic systolic (congestive) heart failure: Secondary | ICD-10-CM | POA: Diagnosis not present

## 2020-10-24 DIAGNOSIS — N041 Nephrotic syndrome with focal and segmental glomerular lesions: Secondary | ICD-10-CM | POA: Diagnosis not present

## 2020-10-24 DIAGNOSIS — N189 Chronic kidney disease, unspecified: Secondary | ICD-10-CM | POA: Diagnosis not present

## 2020-10-24 DIAGNOSIS — I5023 Acute on chronic systolic (congestive) heart failure: Secondary | ICD-10-CM | POA: Diagnosis not present

## 2020-10-24 DIAGNOSIS — D638 Anemia in other chronic diseases classified elsewhere: Secondary | ICD-10-CM | POA: Diagnosis not present

## 2020-10-24 DIAGNOSIS — N184 Chronic kidney disease, stage 4 (severe): Secondary | ICD-10-CM

## 2020-10-24 DIAGNOSIS — I10 Essential (primary) hypertension: Secondary | ICD-10-CM | POA: Diagnosis not present

## 2020-10-24 DIAGNOSIS — R808 Other proteinuria: Secondary | ICD-10-CM | POA: Diagnosis not present

## 2020-10-24 DIAGNOSIS — E211 Secondary hyperparathyroidism, not elsewhere classified: Secondary | ICD-10-CM | POA: Diagnosis not present

## 2020-10-24 DIAGNOSIS — E1122 Type 2 diabetes mellitus with diabetic chronic kidney disease: Secondary | ICD-10-CM | POA: Diagnosis not present

## 2020-10-24 NOTE — Progress Notes (Signed)
Cardiology Office Note    Date:  10/24/2020   ID:  Latoya Walsh, DOB Jun 02, 1960, MRN [id number]  PCP:  [name], MD  Cardiologist: Carlyle Dolly, MD    Chief Complaint  Patient presents with   Follow-up    2 month visit    History of Present Illness:    Latoya Walsh is a 60 y.o. female with past medical history of HFrEF (EF 30-35% by echo in 05/2020), HTN, HLD, Type 2 DM, Stage 4 CKD, Hepatitis C and history of substance use who presents to the office today for 62-monthfollow-up.  She was examined by myself in 08/2020 and reported compliance with her medications at that time. She denied any recent chest pain and reported her breathing had overall been stable.  She was continued on her current medications including Hydralazine, Imdur and Toprol-XL with dosing of Losartan and Lasix deferred to Nephrology in the setting of her advanced CKD.  She did have a repeat echocardiogram in 09/2020 which showed her EF had improved to 45%. She did have grade 3 diastolic dysfunction, mild LVH, mildly reduced RV function, mildly elevated PASP and mild to moderate TR. She did have repeat labs with Nephrology in 09/2020 and creatinine was elevated to 3.41 and it appears Losartan was discontinued at her appointment.  In talking with the patient today, she reports feeling fatigued on a daily basis and questions if this is possibly secondary to her medications. She denies any specific dyspnea on exertion or exertional chest pain.  No recent orthopnea, PND or pitting edema. She does feel that she retains fluid in her abdomen but symptoms did improve with dose titration of Lasix. She does not add salt to her food but does consume fast food intermittently. She has been trying to limit her fluid intake.    Past Medical History:  Diagnosis Date   Allergic rhinitis    Anxiety    CHF (congestive heart failure) ([facility]    a. EF 30-35% by echo in 05/2020   Chronic back pain    Chronic  hepatitis C without hepatic coma [place]    COVID-19 virus infection 12/26/2019   Depression    Diabetes mellitus    Hepatitis C    Hypertension    Noncompliance    Poor appetite 07/17/2014   Substance abuse ([facility]    HX of drug use and alcohol use    Past Surgical History:  Procedure Laterality Date   APPENDECTOMY  2004    Current Medications: Outpatient Medications Prior to Visit  Medication Sig Dispense Refill   amLODipine (NORVASC) 5 MG tablet TAKE ONE TABLET BY MOUTH ONCE DAILY. 30 tablet 0   blood glucose meter kit and supplies Dispense based on patient and insurance preference. Once daily testing dx E11.9. 1 each 0   calcitRIOL (ROCALTROL) 0.25 MCG capsule Take 0.25 mcg by mouth daily.     furosemide (LASIX) 80 MG tablet Take 80 mg  (1 tablet) in the morning and 40 mg (half tablet) in the afternoon. 135 tablet 3   gabapentin (NEURONTIN) 100 MG capsule Take 1 capsule (100 mg total) by mouth 3 (three) times daily. 90 capsule 3   glipiZIDE (GLUCOTROL XL) 5 MG 24 hr tablet Take 5 mg by mouth daily with breakfast.     hydrALAZINE (APRESOLINE) 50 MG tablet Take 1 tablet (50 mg total) by mouth every 8 (eight) hours. 90 tablet 3   isosorbide mononitrate (IMDUR) 30 MG 24 hr tablet Take  1 tablet (30 mg total) by mouth daily. 90 tablet 3   metoprolol succinate (TOPROL-XL) 50 MG 24 hr tablet Take 1 tablet (50 mg total) by mouth daily. Take with or immediately following a meal. 90 tablet 3   mirtazapine (REMERON) 15 MG tablet TAKE (1) TABLET BY MOUTH AT BEDTIME. 30 tablet 0   rosuvastatin (CRESTOR) 10 MG tablet Take 1 tablet (10 mg total) by mouth daily. 90 tablet 3   sodium bicarbonate 650 MG tablet Take 650 mg by mouth 2 (two) times daily.     No facility-administered medications prior to visit.     Allergies:   Penicillins   Social History   Socioeconomic History   Marital status: Single    Spouse name: Not on file   Number of children: 3   Years of education: Not on file    Highest education level: Not on file  Occupational History   Occupation: Disabled  Tobacco Use   Smoking status: Light Smoker    Packs/day: 0.25    Types: Cigarettes   Smokeless tobacco: Never  Vaping Use   Vaping Use: Never used  Substance and Sexual Activity   Alcohol use: Not Currently    Alcohol/week: 40.0 standard drinks    Types: 40 Cans of beer per week    Comment: 2 16oz cans of beer a day   Drug use: Not Currently    Types: Marijuana, Cocaine    Comment: Last smoked marijuana yesterday 10/18/14   Sexual activity: Yes    Birth control/protection: Condom  Other Topics Concern   Not on file  Social History Narrative   Not on file   Social Determinants of Health   Financial Resource Strain: Low Risk    Difficulty of Paying Living Expenses: Not hard at all  Food Insecurity: Food Insecurity Present   Worried About Charity fundraiser in the Last Year: Sometimes true   Arboriculturist in the Last Year: Sometimes true  Transportation Needs: No Transportation Needs   Lack of Transportation (Medical): No   Lack of Transportation (Non-Medical): No  Physical Activity: Not on file  Stress: Not on file  Social Connections: Not on file     Family History:  The patient's family history includes Diabetes in her sister.   Review of Systems:    Please see the history of present illness.     All other systems reviewed and are otherwise negative except as noted above.   Physical Exam:    VS:  BP 132/80   Pulse 74   Ht 5' 7" (1.702 m)   Wt 175 lb (79.4 kg)   LMP 06/23/2010   SpO2 98%   BMI 27.41 kg/m    General: Well developed, well nourished,female appearing in no acute distress. Head: Normocephalic, atraumatic. Neck: No carotid bruits. JVD not elevated.  Lungs: Respirations regular and unlabored, without wheezes or rales.  Heart: Regular rate and rhythm. No S3 or S4.  No murmur, no rubs, or gallops appreciated. Abdomen: Appears non-distended. No obvious abdominal  masses. Msk:  Strength and tone appear normal for age. No obvious joint deformities or effusions. Extremities: No clubbing or cyanosis. No pitting edema.  Distal pedal pulses are 2+ bilaterally. Neuro: Alert and oriented X 3. Moves all extremities spontaneously. No focal deficits noted. Psych:  Responds to questions appropriately with a normal affect. Skin: No rashes or lesions noted  Wt Readings from Last 3 Encounters:  10/24/20 175 lb (79.4 kg)  10/02/20  173 lb (78.5 kg)  09/17/20 165 lb (74.8 kg)     Studies/Labs Reviewed:   EKG:  EKG is not ordered today.  Recent Labs: 06/06/2020: ALT 20; B Natriuretic Peptide 1,241.0 06/07/2020: Hemoglobin 9.7; Platelets 210; TSH 1.673 06/10/2020: BUN 35; Creatinine, Ser 2.81; Potassium 3.9; Sodium 140   Lipid Panel    Component Value Date/Time   CHOL 167 05/02/2020 1535   TRIG 277 (H) 05/02/2020 1535   HDL 34 (L) 05/02/2020 1535   CHOLHDL 4.9 (H) 05/02/2020 1535   CHOLHDL 4.8 03/28/2019 1056   VLDL 14 10/02/2016 0821   LDLCALC 87 05/02/2020 1535   LDLCALC 136 (H) 03/28/2019 1056    Additional studies/ records that were reviewed today include:   Echocardiogram: 09/26/2020 IMPRESSIONS     1. Poor acoustic windows limit study Endocardium is difficult to  visualize Consider limited echo with Definity to define wall motion, LVEF  better Compared to echo from March 2022, LVEF is mildly improved. . Left  ventricular ejection fraction, by  estimation, is 45%%. The left ventricle has mildly decreased function.  There is mild left ventricular hypertrophy. Left ventricular diastolic  parameters are consistent with Grade III diastolic dysfunction  (restrictive). Elevated left atrial pressure.   2. Right ventricular systolic function is mildly reduced. The right  ventricular size is normal. There is mildly elevated pulmonary artery  systolic pressure.   3. Left atrial size was mildly dilated.   4. Right atrial size was mildly dilated.   5.  Mild mitral valve regurgitation.   6. Tricuspid valve regurgitation is mild to moderate.   7. The aortic valve is normal in structure. Aortic valve regurgitation is  not visualized.   8. Pulmonic valve regurgitation is moderate.   9. The inferior vena cava is normal in size with greater than 50%  respiratory variability, suggesting right atrial pressure of 3 mmHg.   Assessment:    1. Chronic systolic heart failure ([place])   2. Mixed hyperlipidemia   3. Essential hypertension   4. CKD (chronic kidney disease) stage 4, GFR 15-29 ml/min (HCC)      Plan:   In order of problems listed above:  1. HFrEF - Her EF was at 30-35% by echo in 05/2020, improved to 45% by echocardiogram in 09/2020.  She does report intermittent abdominal distention but denies any dyspnea on exertion, orthopnea or PND. She remains on Lasix 80 mg in AM/68m in PM and will defer dose adjustment of this to Nephrology in the setting of her worsening CKD. - Will continue her current regimen of Hydralazine 50 mg TID, Imdur 30 mg daily and Toprol-XL 50 mg daily for her cardiomyopathy. Given her fatigue after taking her medications, I will have her pill-pack updated to try taking Toprol-XL at night to see if this helps with her symptoms. She is also on Amlodipine but would continue for now as it is possible her cardiomyopathy was secondary to uncontrolled hypertension and her BP has significantly improved with medication adjustments and compliance. She is no longer on Losartan given her worsening creatinine and is not a candidate for Entresto or SGLT2 inhibitor. No plans for ischemic evaluation at this time as she would not be a cardiac catheterization candidate due to her renal function.   2. HLD - Followed by her PCP. FLP in 04/2020 showed LDL at 87. She remains on Crestor 10 mg daily.  3. HTN - Her BP is well-controlled at 132/80 during today's visit. Continue current medication regimen with Amlodipine  5 mg daily, Hydralazine  50 mg 3 times daily, Imdur 30 mg daily and Toprol-XL 50 mg daily.  4. Stage 4-5 CKD - Creatinine was at 3.41 in 09/2020. Followed by Dr. Theador Hawthorne.    Medication Adjustments/Labs and Tests Ordered: Current medicines are reviewed at length with the patient today.  Concerns regarding medicines are outlined above.  Medication changes, Labs and Tests ordered today are listed in the Patient Instructions below. Patient Instructions  Medication Instructions:  Take Toprol XL in the evening   *If you need a refill on your cardiac medications before your next appointment, please call your pharmacy*   Lab Work: NONE  If you have labs (blood work) drawn today and your tests are completely normal, you will receive your results only by: [place] (if you have MyChart) OR A paper copy in the mail If you have any lab test that is abnormal or we need to change your treatment, we will call you to review the results.   Testing/Procedures: NONE    Follow-Up: At [facility], you and your health needs are our priority.  As part of our continuing mission to provide you with exceptional heart care, we have created designated Provider Care Teams.  These Care Teams include your primary Cardiologist (physician) and Advanced Practice Providers (APPs -  Physician Assistants and Nurse Practitioners) who all work together to provide you with the care you need, when you need it.  We recommend signing up for the patient portal called "MyChart".  Sign up information is provided on this After Visit Summary.  MyChart is used to connect with patients for Virtual Visits (Telemedicine).  Patients are able to view lab/test results, encounter notes, upcoming appointments, etc.  Non-urgent messages can be sent to your provider as well.   To learn more about what you can do with MyChart, go to [email or web address].    Your next appointment:   3-4 month(s)  The format for your next appointment:   In  Person  Provider:   Carlyle Dolly, MD   Other Instructions Thank you for choosing [facility]!     Signed, Erma Heritage, PA-C  10/24/2020 5:09 PM    [place] [place], [place] [address] Phone: [phone] Fax: [phone]

## 2020-10-24 NOTE — Patient Instructions (Signed)
Medication Instructions:  Take Toprol XL in the evening   *If you need a refill on your cardiac medications before your next appointment, please call your pharmacy*   Lab Work: NONE  If you have labs (blood work) drawn today and your tests are completely normal, you will receive your results only by: [place] (if you have MyChart) OR A paper copy in the mail If you have any lab test that is abnormal or we need to change your treatment, we will call you to review the results.   Testing/Procedures: NONE    Follow-Up: At [facility], you and your health needs are our priority.  As part of our continuing mission to provide you with exceptional heart care, we have created designated Provider Care Teams.  These Care Teams include your primary Cardiologist (physician) and Advanced Practice Providers (APPs -  Physician Assistants and Nurse Practitioners) who all work together to provide you with the care you need, when you need it.  We recommend signing up for the patient portal called "MyChart".  Sign up information is provided on this After Visit Summary.  MyChart is used to connect with patients for Virtual Visits (Telemedicine).  Patients are able to view lab/test results, encounter notes, upcoming appointments, etc.  Non-urgent messages can be sent to your provider as well.   To learn more about what you can do with MyChart, go to [email or web address].    Your next appointment:   3-4 month(s)  The format for your next appointment:   In Person  Provider:   Carlyle Dolly, MD   Other Instructions Thank you for choosing [facility]!

## 2020-10-28 ENCOUNTER — Telehealth: Payer: Self-pay

## 2020-10-28 ENCOUNTER — Other Ambulatory Visit: Payer: Self-pay

## 2020-10-28 ENCOUNTER — Other Ambulatory Visit: Payer: Self-pay | Admitting: *Deleted

## 2020-10-28 NOTE — Patient Outreach (Addendum)
Medicaid Managed Care   Nurse Care Manager Note  10/28/2020 Name:  Latoya Walsh MRN:  [id number] DOB:  1960/06/01  Latoya Walsh is an 60 y.o. year old female who is a primary patient of [name], MD.  The [facility] Managed Care Coordination team was consulted for assistance with:    CHF DMII  Latoya Walsh was given information about Medicaid Managed Care Coordination team services today. Latoya Walsh agreed to services and verbal consent obtained.  Engaged with patient by telephone for follow up visit in response to provider referral for case management and/or care coordination services.   Assessments/Interventions:  Review of past medical history, allergies, medications, health status, including review of consultants reports, laboratory and other test data, was performed as part of comprehensive evaluation and provision of chronic care management services.  SDOH (Social Determinants of Health) assessments and interventions performed:   Care Plan  Allergies  Allergen Reactions   Penicillins Itching and Rash    Medications Reviewed Today     Reviewed by Waynetta Pean (Physician Assistant Certified) on 42/70/62 at 1659  Med List Status: <None>   Medication Order Taking? Sig Documenting Provider Last Dose Status Informant  amLODipine (NORVASC) 5 MG tablet [id number] Yes TAKE ONE TABLET BY MOUTH ONCE DAILY. [name], MD Taking Active   blood glucose meter kit and supplies [id number] Yes Dispense based on patient and insurance preference. Once daily testing dx E11.9. [name], MD Taking Active   calcitRIOL (ROCALTROL) 0.25 MCG capsule [id number] Yes Take 0.25 mcg by mouth daily. [provider] Taking Active   furosemide (LASIX) 80 MG tablet [id number] Yes Take 80 mg  (1 tablet) in the morning and 40 mg (half tablet) in the afternoon. [name], MD Taking Active   gabapentin (NEURONTIN) 100 MG capsule [id number] Yes Take  1 capsule (100 mg total) by mouth 3 (three) times daily. [name], NP Taking Active   glipiZIDE (GLUCOTROL XL) 5 MG 24 hr tablet [id number] Yes Take 5 mg by mouth daily with breakfast. [provider] Taking Active   hydrALAZINE (APRESOLINE) 50 MG tablet [id number] Yes Take 1 tablet (50 mg total) by mouth every 8 (eight) hours. [name], MD Taking Active   isosorbide mononitrate (IMDUR) 30 MG 24 hr tablet [id number] Yes Take 1 tablet (30 mg total) by mouth daily. [name], MD Taking Active   metoprolol succinate (TOPROL-XL) 50 MG 24 hr tablet [id number] Yes Take 1 tablet (50 mg total) by mouth daily. Take with or immediately following a meal. [name], MD Taking Active   mirtazapine (REMERON) 15 MG tablet [id number] Yes TAKE (1) TABLET BY MOUTH AT BEDTIME. [name], MD Taking Active   rosuvastatin (CRESTOR) 10 MG tablet [id number] Yes Take 1 tablet (10 mg total) by mouth daily. [name], MD Taking Active   sodium bicarbonate 650 MG tablet [id number] Yes Take 650 mg by mouth 2 (two) times daily. [provider] Taking Active   Med List Note [name], [place] 38/25/05 3976):              Patient Active Problem List   Diagnosis Date Noted   Acute CHF (congestive heart failure) ([place]) 06/06/2020   Leg swelling 04/28/2020   Hyperlipidemia LDL goal <100 04/28/2020   Left hand pain 03/10/2020   Anemia 02/19/2020   Depression, major, single episode, in partial remission ([place]) 01/25/2019   Headache 11/13/2018  Insomnia 09/25/2018   Screening for colorectal cancer 07/23/2016   Lumbar back pain with radiculopathy affecting right lower extremity 03/27/2015   Non compliance with medical treatment 07/22/2014   Chronic hepatitis C without hepatic coma ([place]) 12/04/2013   Allergic rhinitis 11/03/2012   Severe hypertension 06/25/2011   Anxiety and depression 02/05/2011   Nicotine dependence 11/01/2010   Drug dependence, continuous  abuse ([place]) 03/22/2010   Uncontrolled type 2 diabetes mellitus with hyperglycemia ([place]) 06/02/2007   Alcohol abuse 06/02/2007    Conditions to be addressed/monitored per PCP order:  CHF and DMII  Care Plan : Heart Failure (Adult)  Updates made by Jeanette Montane, RN since 10/28/2020 12:00 AM     Problem: Symptom Exacerbation (Heart Failure)      Long-Range Goal: Symptom Exacerbation Prevented or Minimized   Start Date: 08/09/2020  Expected End Date: 11/12/2020  Recent Progress: On track  Priority: High  Note:   Current Barriers:  Knowledge deficit related to basic heart failure pathophysiology and self care management-[name] is managing her health with the assistance of an Aide. Today patient was alert, but confused at times and requested RNCM to speak with her Aide. Latoya Walsh was not at home and was unable to review her medications. She has recently began receiving Paramedic support for medication management. Latoya Walsh is unsure of medications. She is taking her "bubble packs" 3 times a day and is part of the Paramedicine program. Latoya Walsh comes out every Friday and checks BP and BS.-Update-Latoya Walsh continues to feeling "sleepy" after taking her medications.  Latoya Walsh did receive a scale, but is not weighing daily. She is trying to get some exercise by walking up and down the road, but reports back pain afterward. Latoya Walsh is aware of local food pantries and plans to go later today.  Knowledge Deficits related to heart failure medications Patient does not have readable scale Literacy Barriers Financial strain Cognitive Deficits Unable to self administer medications as prescribed Does not adhere to provider recommendations re:  Does not contact provider office for questions/concerns Case Manager Clinical Goal(s):  patient will weigh self daily and record patient will verbalize understanding of Heart Failure Action Plan and when to call doctor patient will take all Heart Failure  mediations as prescribed patient will weigh daily and record (notifying MD of 3 lb weight gain over night or 5 lb in a week) Interventions:   Reviewed upcoming appointments: 8/15-Endocrinology and 8/11 with Urology Provided encouragement Basic overview and discussion of pathophysiology of Heart Failure reviewed  Provided written information on diabetic and dash diet Discussed importance of daily weight and advised patient to weigh and record daily, call provider with weight gain of 3# in one day or 5# in a week Provided therapeutic listening Advised patient to wear a supportive shoe while exercising-do not wear flip flops Patient Goals/Self-Care Activities - attend scheduled appointments - replace chips with a healthy snack like nuts - eat more whole grains, fruits and vegetables, lean meats and healthy fats - know when to call the doctor - track symptoms and what helps feel better or worse - dress right for the weather, hot or cold  - call office if I gain more than 2 pounds in one day or 5 pounds in one week - use salt in moderation - watch for swelling in feet, ankles and legs every day - weigh myself daily  Follow Up Plan: Telephone follow up appointment with care management team member scheduled for:11/12/20 @ 12:30pm  Care Plan : Diabetes Type 2 (Adult)  Updates made by Vita Montane, RN since 10/28/2020 12:00 AM     Problem: Glycemic Management (Diabetes, Type 2)      Long-Range Goal: Glycemic Management Optimized   Start Date: 08/09/2020  Expected End Date: 11/12/2020  Recent Progress: On track  Priority: Medium  Note:   Objective:  Lab Results  Component Value Date   HGBA1C 8.5 (A) 09/17/2020   HGBA1C 8.5 09/17/2020   HGBA1C 8.5 (A) 09/17/2020   HGBA1C 8.5 (A) 09/17/2020   Lab Results  Component Value Date   CREATININE 2.81 (H) 06/10/2020   CREATININE 2.53 (H) 06/09/2020   CREATININE 2.54 (H) 06/08/2020   No results found for: EGFR Current Barriers:   Knowledge Deficits related to basic Diabetes pathophysiology and self care/management-RNCM reviewed note in patients chart regarding elevated blood sugars and PCP would like for patient to be seen this week. Patient reports checking her BS daily, yesterday reading was 202. Patient with decreased appetite and unclear about what she is eating. Patient was evaluated and referral to Endocrinology was placed. Latoya Walsh has started attending a bible study on Monday and Thursday evenings.-Update-Latoya Walsh is checking her blood sugar 3-4 times a day and keeping a log. She is aware of her Urology and Endocrinology upcoming appointments and will need transportation to these appointments. Knowledge Deficits related to medications used for management of diabetes Difficulty obtaining or cannot afford medications Does not have glucometer to monitor blood sugar Financial Constraints Cognitive Deficits Unable to self administer medications as prescribed Does not contact provider office for questions/concerns Case Manager Clinical Goal(s):  patient will demonstrate improved adherence to prescribed treatment plan for diabetes self care/management as evidenced by: daily monitoring and recording of CBG,  adherence to ADA/ carb modified diet, adherence to prescribed medication regimen, contacting provider for new or worsened symptoms or questions.   Interventions:  Inter-disciplinary care team collaboration (see longitudinal plan of care) Provided education to patient about basic DM disease process Discussed plans with patient for ongoing care management follow up and provided patient with direct contact information for care management team Provided patient with written educational materials related to diabetic nutrition Reviewed upcoming appointments: 8/15-Endocrinology and 8/11 with Urology Review of patient status, including review of consultants reports, relevant laboratory and other test results, and medications  completed. Discussed the importance of eating 3 small meals a day, including protein and vegetables Arranged transportation with [facility] for 8/11 @ [facility] and 8/15 @ [facility]. Patient aware of pick up times for both appointments Will follow closely with frequent telephone visits Collaborated with [facility] Pharmacist for test strip refill Self-Care Activities - Self administers oral medications as prescribed Attends all scheduled provider appointments Checks blood sugars as prescribed and utilize hyper and hypoglycemia protocol as needed Adheres to prescribed ADA/carb modified Patient Goals: - attend appointments on 8/15 with Endocrinology and 8/11 with Urology - adhere to a diabetic diet, eat three meals a day, including protein with each meal - check blood sugar at prescribed times - check blood sugar if I feel it is too high or too low - call your provider with abnormal readings - enter blood sugar readings and medication or insulin into daily log - take the blood sugar log to all doctor visits - take the blood sugar meter to all doctor visits  - increase exercise to 30 min a day/5 days a week Follow Up Plan: Telephone follow up appointment with care management team member  scheduled for:11/12/20 @ 12:30pm      Follow Up:  Patient agrees to Care Plan and Follow-up.  Plan: The Managed Medicaid care management team will reach out to the patient again over the next 14 days.  Date/time of next scheduled RN care management/care coordination outreach:  11/12/20 @ 12:30pm  [name] RN, [place] RN Care Coordinator

## 2020-10-28 NOTE — Patient Outreach (Signed)
Per Case Manager, patient would like a refill of TS ASAP. Will try to deliver today or tomorrow at the latest

## 2020-10-28 NOTE — Patient Instructions (Addendum)
Visit Information  Latoya Walsh was given information about Medicaid Managed Care team care coordination services as a part of their [place] Medicaid benefit. Latoya Walsh verbally consented to engagement with the [facility] Managed Care team.   If you are experiencing a medical emergency, please call 911 or report to your local emergency department or urgent care.   If you have a non-emergency medical problem during routine business hours, please contact your provider's office and ask to speak with a nurse.   For questions related to your [facility], please call: [phone] or visit the homepage here: [email or web address]  If you would like to schedule transportation through your [facility], please call the following number at least 2 days in advance of your appointment: [phone].   Call the [place] at [phone], at any time, 24 hours a day, 7 days a week. If you are in danger or need immediate medical attention call 911.  If you would like help to quit smoking, call 1-800-QUIT-NOW 316-813-1537) OR Espaol: 1-855-Djelo-Ya (6-468-032-1224) o para ms informacin haga clic aqu or Text READY to 200-400 to register via text  Latoya Walsh - following are the goals we discussed in your visit today:   Goals Addressed             This Visit's Progress    Monitor and Manage My Blood Sugar-Diabetes Type 2       Timeframe:  Long-Range Goal Priority:  Medium Start Date:  08/09/20                           Expected End Date:  11/12/20                     Follow Up Date  11/12/20   - attend appointments on 8/15 with Endocrinology and 8/11 with Urology - adhere to a diabetic diet, eat three meals a day, including protein with each meal - check blood sugar at prescribed times - check blood sugar if I feel it is too high or too low - call  your provider with abnormal readings - enter blood sugar readings and medication or insulin into daily log - take the blood sugar log to all doctor visits - take the blood sugar meter to all doctor visits  - increase exercise to 30 min a day/5 days a week   Why is this important?   Checking your blood sugar at home helps to keep it from getting very high or very low.  Writing the results in a diary or log helps the doctor know how to care for you.  Your blood sugar log should have the time, date and the results.  Also, write down the amount of insulin or other medicine that you take.  Other information, like what you ate, exercise done and how you were feeling, will also be helpful.          Track and Manage Fluids and Swelling-Heart Failure       Timeframe:  Long-Range Goal Priority:  High Start Date:    08/09/20                         Expected End Date:    11/12/20                   Follow Up Date 11/12/20   -  weigh at the same time each day - call office if I gain more than 2 pounds in one day or 5 pounds in one week - use salt in moderation - watch for swelling in feet, ankles and legs every day - call provider with any concerns or questions     Why is this important?   It is important to check your weight daily and watch how much salt and liquids you have.  It will help you to manage your heart failure.         Track and Manage Symptoms-Heart Failure       Timeframe:  Long-Range Goal Priority:  High Start Date:   08/09/20                          Expected End Date:    11/12/20                   Follow Up Date 11/12/20    - work on cutting out smoking completely - replace chips with a healthy snack like nuts - eat more whole grains, fruits and vegetables, lean meats and healthy fats - know when to call the doctor - track symptoms and what helps feel better or worse - dress right for the weather, hot or cold    Why is this important?   You will be able to handle your  symptoms better if you keep track of them.  Making some simple changes to your lifestyle will help.  Eating healthy is one thing you can do to take good care of yourself.            Please see education materials related to diabetes provided as print materials.   The patient verbalized understanding of instructions provided today and agreed to receive a mailed copy of patient instruction and/or educational materials.  Telephone follow up appointment with Managed Medicaid care management team member scheduled for:11/12/20 @ 12:30pm  [name] RN, BSN [place] RN Care Coordinator   Following is a copy of your plan of care:  Patient Care Plan: Heart Failure (Adult)     Problem Identified: Symptom Exacerbation (Heart Failure)      Long-Range Goal: Symptom Exacerbation Prevented or Minimized   Start Date: 08/09/2020  Expected End Date: 11/12/2020  Recent Progress: On track  Priority: High  Note:   Current Barriers:  Knowledge deficit related to basic heart failure pathophysiology and self care management-Latoya Walsh is managing her health with the assistance of an Aide. Today patient was alert, but confused at times and requested RNCM to speak with her Aide. Latoya Walsh was not at home and was unable to review her medications. She has recently began receiving Paramedic support for medication management. Latoya Walsh is unsure of medications. She is taking her "bubble packs" 3 times a day and is part of the Paramedicine program. [name] comes out every Friday and checks BP and BS.-Update-Latoya Walsh continues to feeling "sleepy" after taking her medications.  Latoya Walsh did receive a scale, but is not weighing daily. She is trying to get some exercise by walking up and down the road, but reports back pain afterward. Latoya Walsh is aware of local food pantries and plans to go later today.  Knowledge Deficits related to heart failure medications Patient does not have  readable scale Literacy Barriers Financial strain Cognitive Deficits Unable to self administer medications as prescribed Does not adhere  to provider recommendations re:  Does not contact provider office for questions/concerns Case Manager Clinical Goal(s):  patient will weigh self daily and record patient will verbalize understanding of Heart Failure Action Plan and when to call doctor patient will take all Heart Failure mediations as prescribed patient will weigh daily and record (notifying MD of 3 lb weight gain over night or 5 lb in a week) Interventions:   Reviewed upcoming appointments: 8/15-Endocrinology and 8/11 with Urology Provided encouragement Basic overview and discussion of pathophysiology of Heart Failure reviewed  Provided written information on diabetic and dash diet Discussed importance of daily weight and advised patient to weigh and record daily, call provider with weight gain of 3# in one day or 5# in a week Provided therapeutic listening Advised patient to wear a supportive shoe while exercising-do not wear flip flops Patient Goals/Self-Care Activities - attend scheduled appointments - replace chips with a healthy snack like nuts - eat more whole grains, fruits and vegetables, lean meats and healthy fats - know when to call the doctor - track symptoms and what helps feel better or worse - dress right for the weather, hot or cold  - call office if I gain more than 2 pounds in one day or 5 pounds in one week - use salt in moderation - watch for swelling in feet, ankles and legs every day - weigh myself daily  Follow Up Plan: Telephone follow up appointment with care management team member scheduled for:11/12/20 @ 12:30pm     Patient Care Plan: Diabetes Type 2 (Adult)     Problem Identified: Glycemic Management (Diabetes, Type 2)      Long-Range Goal: Glycemic Management Optimized   Start Date: 08/09/2020  Expected End Date: 11/12/2020  Recent Progress: On  track  Priority: Medium  Note:   Objective:  Lab Results  Component Value Date   HGBA1C 8.5 (A) 09/17/2020   HGBA1C 8.5 09/17/2020   HGBA1C 8.5 (A) 09/17/2020   HGBA1C 8.5 (A) 09/17/2020   Lab Results  Component Value Date   CREATININE 2.81 (H) 06/10/2020   CREATININE 2.53 (H) 06/09/2020   CREATININE 2.54 (H) 06/08/2020   No results found for: EGFR Current Barriers:  Knowledge Deficits related to basic Diabetes pathophysiology and self care/management-RNCM reviewed note in patients chart regarding elevated blood sugars and PCP would like for patient to be seen this week. Patient reports checking her BS daily, yesterday reading was 202. Patient with decreased appetite and unclear about what she is eating. Patient was evaluated and referral to Endocrinology was placed. Ms. Incorvaia has started attending a bible study on Monday and Thursday evenings.-Update-Ms. Lund is checking her blood sugar 3-4 times a day and keeping a log. She is aware of her Urology and Endocrinology upcoming appointments and will need transportation to these appointments. Knowledge Deficits related to medications used for management of diabetes Difficulty obtaining or cannot afford medications Does not have glucometer to monitor blood sugar Financial Constraints Cognitive Deficits Unable to self administer medications as prescribed Does not contact provider office for questions/concerns Case Manager Clinical Goal(s):  patient will demonstrate improved adherence to prescribed treatment plan for diabetes self care/management as evidenced by: daily monitoring and recording of CBG,  adherence to ADA/ carb modified diet, adherence to prescribed medication regimen, contacting provider for new or worsened symptoms or questions.   Interventions:  Inter-disciplinary care team collaboration (see longitudinal plan of care) Provided education to patient about basic DM disease process Discussed plans with patient for ongoing  care management follow up and provided patient with direct contact information for care management team Provided patient with written educational materials related to diabetic nutrition Reviewed upcoming appointments: 8/15-Endocrinology and 8/11 with Urology Review of patient status, including review of consultants reports, relevant laboratory and other test results, and medications completed. Discussed the importance of eating 3 small meals a day, including protein and vegetables Arranged transportation with [facility] for 8/11 @ [facility] and 8/15 @ [facility]. Patient aware of pick up times for both appointments Will follow closely with frequent telephone visits Self-Care Activities - Self administers oral medications as prescribed Attends all scheduled provider appointments Checks blood sugars as prescribed and utilize hyper and hypoglycemia protocol as needed Adheres to prescribed ADA/carb modified Patient Goals: - attend appointments on 8/15 with Endocrinology and 8/11 with Urology - adhere to a diabetic diet, eat three meals a day, including protein with each meal - check blood sugar at prescribed times - check blood sugar if I feel it is too high or too low - call your provider with abnormal readings - enter blood sugar readings and medication or insulin into daily log - take the blood sugar log to all doctor visits - take the blood sugar meter to all doctor visits  - increase exercise to 30 min a day/5 days a week Follow Up Plan: Telephone follow up appointment with care management team member scheduled for:11/12/20 @ 12:30pm     Patient Care Plan: Medication management     Problem Identified: Health Promotion or Disease Self-Management (General Plan of Care)      Goal: Medication Management   Note:   Current Barriers:  Unable to self administer medications as prescribed Does not adhere to prescribed medication regimen Does not maintain contact with provider  office Does not contact provider office for questions/concerns   Pharmacist Clinical Goal(s):  Over the next 30 days, patient will contact provider office for questions/concerns as evidenced notation of same in electronic health record through collaboration with PharmD and provider.    Interventions: Inter-disciplinary care team collaboration (see longitudinal plan of care) Comprehensive medication review performed; medication list updated in electronic medical record    Patient Goals/Self-Care Activities Over the next 30 days, patient will:  - collaborate with provider on medication access solutions  Follow Up Plan: The care management team will reach out to the patient again over the next 30 days.

## 2020-10-28 NOTE — Telephone Encounter (Signed)
Amy from [facility] called said patients glucose meter broke.  Will be sending out a new accu chek meter and test strips to patient.

## 2020-10-31 DIAGNOSIS — D638 Anemia in other chronic diseases classified elsewhere: Secondary | ICD-10-CM | POA: Diagnosis not present

## 2020-10-31 DIAGNOSIS — R808 Other proteinuria: Secondary | ICD-10-CM | POA: Diagnosis not present

## 2020-10-31 DIAGNOSIS — E1122 Type 2 diabetes mellitus with diabetic chronic kidney disease: Secondary | ICD-10-CM | POA: Diagnosis not present

## 2020-10-31 DIAGNOSIS — E211 Secondary hyperparathyroidism, not elsewhere classified: Secondary | ICD-10-CM | POA: Diagnosis not present

## 2020-10-31 DIAGNOSIS — E559 Vitamin D deficiency, unspecified: Secondary | ICD-10-CM | POA: Diagnosis not present

## 2020-10-31 DIAGNOSIS — N041 Nephrotic syndrome with focal and segmental glomerular lesions: Secondary | ICD-10-CM | POA: Diagnosis not present

## 2020-10-31 DIAGNOSIS — N185 Chronic kidney disease, stage 5: Secondary | ICD-10-CM | POA: Diagnosis not present

## 2020-10-31 DIAGNOSIS — N170 Acute kidney failure with tubular necrosis: Secondary | ICD-10-CM | POA: Diagnosis not present

## 2020-11-04 ENCOUNTER — Other Ambulatory Visit: Payer: Self-pay

## 2020-11-04 ENCOUNTER — Encounter: Payer: Self-pay | Admitting: Nurse Practitioner

## 2020-11-04 ENCOUNTER — Ambulatory Visit (INDEPENDENT_AMBULATORY_CARE_PROVIDER_SITE_OTHER): Payer: Medicaid Other | Admitting: Nurse Practitioner

## 2020-11-04 VITALS — BP 146/79 | HR 70 | Ht 67.0 in | Wt 174.0 lb

## 2020-11-04 DIAGNOSIS — E1165 Type 2 diabetes mellitus with hyperglycemia: Secondary | ICD-10-CM | POA: Diagnosis not present

## 2020-11-04 MED ORDER — PEN NEEDLES 31G X 5 MM MISC: 3 refills | Status: AC

## 2020-11-04 MED ORDER — LANTUS SOLOSTAR 100 UNIT/ML ~~LOC~~ SOPN: 10.0000 [IU] | PEN_INJECTOR | Freq: Every day | SUBCUTANEOUS | 1 refills | Status: DC

## 2020-11-04 NOTE — Patient Instructions (Signed)
http://www.diabetes.org/living-with-diabetes/treatment-and-care/medication/insulin/insulin-storage-and-syringe-safety.html">  Insulin Storage and Care All insulin pens and bottles (vials) have expiration dates. Insulin pens and vials that are refrigerated andunopened are good until the expiration date. Once in use, vial and pen expiration dates vary depending on the type of insulin being used. For vials, in use means that the rubber has been punctured. Do not use insulin after the expiration date. Always follow the instructions thatcome with your insulin. How to store your insulin Store your insulin according to the instructions on the packaging. Insulin storage recommendations vary depending on the type of insulin and manufacturer. If insulin is kept at room temperature, keep the temperature between 59-53F (15-30C). If insulin is kept in the refrigerator, keep the temperature between 36-34F (3-8C). If insulin is kept in the refrigerator, warm it to room temperature before use. Cold insulin stings when injected. Do not freeze insulin. Keep insulin away from direct heat or sunlight. How to throw away your supplies  Get rid of your insulin if: It is expired. It is discolored. It is thick. It has clumps in it. It has white particles suspended in it. Discard all used needles in a puncture-proof sharps disposal container. You can ask your local pharmacy about where you can get a sharps container. You can also use an empty liquid laundry detergent bottle that has a lid. Follow the disposal regulations for the area where you live. Do not use any syringe or needle more than one time. Throw away empty vials and pens (with needles removed) in the regular trash. Follow these instructions at home Always keep extra insulin and supplies with you. Always inspect your insulin prior to injecting. Do not use the insulin if you notice any discoloration, particles, or clumping. Mix cloudy insulin by gently  rolling the vial or pen between your palms several times. Do not  shake the vial or pen. Do not leave insulin in your vehicle or in any place where it can get too hot or too cold. Use an insulated travel pack to store your insulin vials or pens when traveling. Where to find more information American Diabetes Association: www.diabetes.[employer] of Diabetes and Digestive and Kidney Diseases: [employer] Association of Diabetes Care & Education Specialists: www.diabeteseducator.org Summary All insulin pens and bottles (vials) have expiration dates. Check the expiration date before using the insulin. Do not use insulin past the expiration date. Check insulin before using to make sure it looks normal. Mix cloudy insulin before using. Do not use insulin if it is discolored or has particles or clumps. Store your insulin according to the directions on the packaging. Do not store insulin in extreme heat or cold, such as in the freezer, direct sunlight, or in your vehicle. This information is not intended to replace advice given to you by your health care provider. Make sure you discuss any questions you have with your healthcare provider. Document Revised: 12/26/2019 Document Reviewed: 12/26/2019 Elsevier Patient Education  2022 [employer].

## 2020-11-04 NOTE — Progress Notes (Signed)
Endocrinology Consult Note       11/04/2020, 11:31 AM   Subjective:    Patient ID: Latoya Walsh, female    DOB: Mar 18, 1961.  Latoya Walsh is being seen in consultation for management of currently uncontrolled symptomatic diabetes requested by  [name], MD.   Past Medical History:  Diagnosis Date   Allergic rhinitis    Anxiety    CHF (congestive heart failure) ([place])    a. EF 30-35% by echo in 05/2020   Chronic back pain    Chronic hepatitis C without hepatic coma ([place])    COVID-19 virus infection 12/26/2019   Depression    Diabetes mellitus    Hepatitis C    Hypertension    Noncompliance    Poor appetite 07/17/2014   Substance abuse ([place])    HX of drug use and alcohol use    Past Surgical History:  Procedure Laterality Date   APPENDECTOMY  2004    Social History   Socioeconomic History   Marital status: Single    Spouse name: Not on file   Number of children: 3   Years of education: Not on file   Highest education level: Not on file  Occupational History   Occupation: Disabled  Tobacco Use   Smoking status: Light Smoker    Packs/day: 0.25    Types: Cigarettes   Smokeless tobacco: Never  Vaping Use   Vaping Use: Never used  Substance and Sexual Activity   Alcohol use: Not Currently    Alcohol/week: 40.0 standard drinks    Types: 40 Cans of beer per week    Comment: 2 16oz cans of beer a day   Drug use: Not Currently    Types: Marijuana, Cocaine    Comment: Last smoked marijuana yesterday 10/18/14   Sexual activity: Yes    Birth control/protection: Condom  Other Topics Concern   Not on file  Social History Narrative   Not on file   Social Determinants of Health   Financial Resource Strain: Low Risk    Difficulty of Paying Living Expenses: Not hard at all  Food Insecurity: Food Insecurity Present   Worried About Charity fundraiser in the Last Year: Sometimes  true   Arboriculturist in the Last Year: Sometimes true  Transportation Needs: No Transportation Needs   Lack of Transportation (Medical): No   Lack of Transportation (Non-Medical): No  Physical Activity: Not on file  Stress: Not on file  Social Connections: Not on file    Family History  Problem Relation Age of Onset   Diabetes Sister        Twin - AIDS     Outpatient Encounter Medications as of 11/04/2020  Medication Sig   insulin glargine (LANTUS SOLOSTAR) 100 UNIT/ML Solostar Pen Inject 10 Units into the skin at bedtime.   Insulin Pen Needle (PEN NEEDLES) 31G X 5 MM MISC Use to inject insulin once daily   amLODipine (NORVASC) 5 MG tablet TAKE ONE TABLET BY MOUTH ONCE DAILY.   blood glucose meter kit and supplies Dispense based on patient and insurance preference. Once daily testing dx E11.9.   calcitRIOL (ROCALTROL) 0.25 MCG  capsule Take 0.25 mcg by mouth daily.   furosemide (LASIX) 80 MG tablet Take 80 mg  (1 tablet) in the morning and 40 mg (half tablet) in the afternoon.   gabapentin (NEURONTIN) 100 MG capsule Take 1 capsule (100 mg total) by mouth 3 (three) times daily.   glipiZIDE (GLUCOTROL XL) 5 MG 24 hr tablet Take 5 mg by mouth daily with breakfast.   hydrALAZINE (APRESOLINE) 50 MG tablet Take 1 tablet (50 mg total) by mouth every 8 (eight) hours.   isosorbide mononitrate (IMDUR) 30 MG 24 hr tablet Take 1 tablet (30 mg total) by mouth daily.   metoprolol succinate (TOPROL-XL) 50 MG 24 hr tablet Take 1 tablet (50 mg total) by mouth daily. Take with or immediately following a meal.   mirtazapine (REMERON) 15 MG tablet TAKE (1) TABLET BY MOUTH AT BEDTIME.   rosuvastatin (CRESTOR) 10 MG tablet Take 1 tablet (10 mg total) by mouth daily.   sodium bicarbonate 650 MG tablet Take 650 mg by mouth 2 (two) times daily.   No facility-administered encounter medications on file as of 11/04/2020.    ALLERGIES: Allergies  Allergen Reactions   Penicillins Itching and Rash     VACCINATION STATUS: Immunization History  Administered Date(s) Administered   H1N1 04/04/2008   Influenza Split 02/05/2011, 12/02/2011   Influenza Whole 12/10/2006, 01/08/2009   Influenza,inj,Quad PF,6+ Mos 02/12/2014, 03/27/2015, 03/05/2020   PFIZER(Purple Top)SARS-COV-2 Vaccination 09/29/2019   Pneumococcal Conjugate-13 06/11/2014   Pneumococcal Polysaccharide-23 12/05/2004, 10/30/2010   Td 08/09/2003    Diabetes She presents for her follow-up diabetic visit. She has type 2 diabetes mellitus. Onset time: Diagnosed at approx age of 76. Her disease course has been stable. There are no hypoglycemic associated symptoms. Pertinent negatives for hypoglycemia include no nervousness/anxiousness or sweats. Associated symptoms include blurred vision, fatigue and polyuria. Pertinent negatives for diabetes include no weight loss. There are no hypoglycemic complications. Symptoms are stable. Diabetic complications include heart disease and nephropathy. Risk factors for coronary artery disease include diabetes mellitus, dyslipidemia, hypertension, sedentary lifestyle and tobacco exposure. Current diabetic treatment includes oral agent (monotherapy). She is compliant with treatment most of the time. Her weight is fluctuating minimally. She is following a generally unhealthy diet. When asked about meal planning, she reported none. She has not had a previous visit with a dietitian. She rarely participates in exercise. Her breakfast blood glucose range is generally >200 mg/dl. Her lunch blood glucose range is generally 180-200 mg/dl. Her dinner blood glucose range is generally >200 mg/dl. Her bedtime blood glucose range is generally >200 mg/dl. Her overall blood glucose range is >200 mg/dl. (She presents today with her meter and logs showing fluctuating glycemic profile overall.  She was not due for another A1c today.  She denies drinking alcohol anymore.  She did have 1 episodes of hypoglycemia before dinner  one night, unable to give me the circumstances around the event.) An ACE inhibitor/angiotensin II receptor blocker is being taken. She does not see a podiatrist.Eye exam is not current.  Hypertension This is a chronic problem. The current episode started more than 1 year ago. The problem has been waxing and waning since onset. The problem is uncontrolled. Associated symptoms include blurred vision. Pertinent negatives include no sweats. There are no associated agents to hypertension. Risk factors for coronary artery disease include diabetes mellitus, dyslipidemia, family history, sedentary lifestyle and smoking/tobacco exposure. Past treatments include calcium channel blockers, diuretics, angiotensin blockers, beta blockers and direct vasodilators. The current treatment provides moderate improvement.  Compliance problems include diet, exercise and psychosocial issues.  Hypertensive end-organ damage includes kidney disease and heart failure. Identifiable causes of hypertension include chronic renal disease.  Hyperlipidemia This is a chronic problem. The current episode started more than 1 year ago. The problem is uncontrolled. Recent lipid tests were reviewed and are variable. Exacerbating diseases include chronic renal disease and diabetes. Factors aggravating her hyperlipidemia include beta blockers, fatty foods and smoking. Current antihyperlipidemic treatment includes statins. The current treatment provides mild improvement of lipids. Compliance problems include adherence to diet, adherence to exercise and psychosocial issues.  Risk factors for coronary artery disease include diabetes mellitus, dyslipidemia, family history, hypertension and a sedentary lifestyle.    Review of systems  Constitutional: + stable body weight, current Body mass index is 27.25 kg/m., + fatigue, no subjective hyperthermia, no subjective hypothermia Eyes: + blurry vision, no xerophthalmia ENT: no sore throat, no nodules  palpated in throat, no dysphagia/odynophagia, no hoarseness Cardiovascular: no chest pain, no shortness of breath, no palpitations, + leg swelling Respiratory: no cough, no shortness of breath Gastrointestinal: no nausea/vomiting/diarrhea Musculoskeletal: no muscle/joint aches Skin: no rashes, no hyperemia Neurological: no tremors, no numbness, no tingling, no dizziness Psychiatric: no depression, no anxiety  Objective:     BP (!) 146/79   Pulse 70   Ht 5' 7"  (1.702 m)   Wt 174 lb (78.9 kg)   LMP 06/23/2010   BMI 27.25 kg/m   Wt Readings from Last 3 Encounters:  11/04/20 174 lb (78.9 kg)  10/24/20 175 lb (79.4 kg)  10/02/20 173 lb (78.5 kg)     BP Readings from Last 3 Encounters:  11/04/20 (!) 146/79  10/24/20 132/80  10/02/20 (!) 163/76      Physical Exam- Limited  Constitutional:  Body mass index is 27.25 kg/m. , not in acute distress, normal state of mind Eyes:  EOMI, no exophthalmos Neck: Supple Cardiovascular: RRR, no murmurs, rubs, or gallops, no edema Respiratory: Adequate breathing efforts, no crackles, rales, rhonchi, or wheezing Musculoskeletal: no gross deformities, strength intact in all four extremities, no gross restriction of joint movements Skin:  no rashes, no hyperemia Neurological: no tremor with outstretched hands    POCT ABI Results 11/04/20   Right ABI:  1.17      Left ABI:  1.12  Right leg systolic / diastolic: 536/64 mmHg Left leg systolic / diastolic: 403/47 mmHg  Arm systolic / diastolic: 425/95 mmHG  Detailed report will be scanned into patient chart.  CMP ( most recent) CMP     Component Value Date/Time   NA 140 06/10/2020 0501   NA 144 05/02/2020 1535   K 3.9 06/10/2020 0501   CL 106 06/10/2020 0501   CO2 24 06/10/2020 0501   GLUCOSE 124 (H) 06/10/2020 0501   BUN 35 (H) 06/10/2020 0501   BUN 38 (H) 05/02/2020 1535   CREATININE 2.81 (H) 06/10/2020 0501   CREATININE 1.49 (H) 03/28/2019 1056   CALCIUM 8.2 (L) 06/10/2020  0501   PROT 6.1 (L) 06/06/2020 1447   PROT 6.2 05/02/2020 1535   ALBUMIN 2.7 (L) 06/06/2020 1447   ALBUMIN 3.3 (L) 05/02/2020 1535   AST 26 06/06/2020 1447   ALT 20 06/06/2020 1447   ALT 65 (H) 12/04/2013 1449   ALKPHOS 87 06/06/2020 1447   BILITOT 0.5 06/06/2020 1447   BILITOT <0.2 05/02/2020 1535   BILITOT 0.3 12/04/2013 1449   GFRNONAA 19 (L) 06/10/2020 0501   GFRNONAA 39 (L) 03/28/2019 1056   GFRAA 31 (L)  05/02/2020 1535   GFRAA 45 (L) 03/28/2019 1056     Diabetic Labs (most recent): Lab Results  Component Value Date   HGBA1C 8.5 (A) 09/17/2020   HGBA1C 8.5 09/17/2020   HGBA1C 8.5 (A) 09/17/2020   HGBA1C 8.5 (A) 09/17/2020     Lipid Panel ( most recent) Lipid Panel     Component Value Date/Time   CHOL 167 05/02/2020 1535   TRIG 277 (H) 05/02/2020 1535   HDL 34 (L) 05/02/2020 1535   CHOLHDL 4.9 (H) 05/02/2020 1535   CHOLHDL 4.8 03/28/2019 1056   VLDL 14 10/02/2016 0821   LDLCALC 87 05/02/2020 1535   LDLCALC 136 (H) 03/28/2019 1056   LABVLDL 46 (H) 05/02/2020 1535      Lab Results  Component Value Date   TSH 1.673 06/07/2020   TSH 2.160 01/05/2020   TSH 2.32 09/27/2018   TSH 1.99 10/02/2016   TSH 1.238 04/22/2015   TSH 1.419 02/19/2014   TSH 2.567 07/11/2012   TSH 2.052 10/30/2010   TSH 1.170 09/14/2008   TSH 1.172 09/16/2007           Assessment & Plan:   1) Uncontrolled type 2 diabetes mellitus with hyperglycemia ([place])  She presents today with her meter and logs showing fluctuating glycemic profile overall.  She was not due for another A1c today.  She denies drinking alcohol anymore.  She did have 1 episodes of hypoglycemia before dinner one night, unable to give me the circumstances around the event.  - Latoya Walsh has currently uncontrolled symptomatic type 2 DM since 60 years of age, with most recent A1c of 8.5 %.   -Recent labs reviewed.  - I had a long discussion with her about the progressive nature of diabetes and the pathology  behind its complications. -her diabetes is complicated by noncompliance, CHF, CKD stage 4, ETOH abuse and she remains at a high risk for more acute and chronic complications which include CAD, CVA, CKD, retinopathy, and neuropathy. These are all discussed in detail with her.  - Nutritional counseling repeated at each appointment due to patients tendency to fall back in to old habits.  - The patient admits there is a room for improvement in their diet and drink choices. -  Suggestion is made for the patient to avoid simple carbohydrates from their diet including Cakes, Sweet Desserts / Pastries, Ice Cream, Soda (diet and regular), Sweet Tea, Candies, Chips, Cookies, Sweet Pastries, Store Bought Juices, Alcohol in Excess of 1-2 drinks a day, Artificial Sweeteners, Coffee Creamer, and "Sugar-free" Products. This will help patient to have stable blood glucose profile and potentially avoid unintended weight gain.   - I encouraged the patient to switch to unprocessed or minimally processed complex starch and increased protein intake (animal or plant source), fruits, and vegetables.   - Patient is advised to stick to a routine mealtimes to eat 3 meals a day and avoid unnecessary snacks (to snack only to correct hypoglycemia).  - I have approached her with the following individualized plan to manage her diabetes and patient agrees:   Based on her above target glycemic profile overall, she is approached to start low dose basal insulin and she agrees.  I discussed and initiated Lantus 10 units SQ nightly (samples of [place] provided from office today with instructions on how to inject insulin).  She can continue her Glipizide 5 mg XL daily with breakfast.   -she is encouraged to continue monitoring blood glucose twice daily, before breakfast and  before bed, and to call the clinic if she has readings less than 70 or above 300 for 3 tests in a row.   - she is warned not to take insulin without proper monitoring  per orders. - Adjustment parameters are given to her for hypo and hyperglycemia in writing.  - she is not a candidate for Metformin or SGLT2i due to concurrent renal insufficiency.  - she is not a good candidate for incretin therapy due to body habitus.  - Specific targets for  A1c; LDL, HDL, and Triglycerides were discussed with the patient.  2) Blood Pressure /Hypertension:  her blood pressure is not controlled to target.   she is advised to continue her current medications including Norvasc 5 mg p.o. daily with breakfast, Lasix 40 mg po daily, Hydralazine 50 mg po TID, and Losartan 25 mg po daily.  Will defer med changes to nephrology.  3) Lipids/Hyperlipidemia:    Review of her recent lipid panel from 05/02/20 showed controlled LDL at 87 with elevated triglycerides of 277 .  she is advised to continue Crestor 10 mg daily at bedtime.  Side effects and precautions discussed with her.  4)  Weight/Diet:  her Body mass index is 27.25 kg/m.  -    she is not a candidate for major weight loss.  Exercise, and detailed carbohydrates information provided  -  detailed on discharge instructions.  5) Chronic Care/Health Maintenance: -she is on ACEI/ARB and Statin medications and is encouraged to initiate and continue to follow up with Ophthalmology, Dentist, Podiatrist at least yearly or according to recommendations, and advised to [place]. I have recommended yearly flu vaccine and pneumonia vaccine at least every 5 years; moderate intensity exercise for up to 150 minutes weekly; and sleep for at least 7 hours a day.  - she is advised to maintain close follow up with [name], MD for primary care needs, as well as her other providers for optimal and coordinated care.     I spent 40 minutes in the care of the patient today including review of labs from [place], Lipids, Thyroid Function, Hematology (current and previous including abstractions from other facilities); face-to-face time  discussing  her blood glucose readings/logs, discussing hypoglycemia and hyperglycemia episodes and symptoms, medications doses, her options of short and long term treatment based on the latest standards of care / guidelines;  discussion about incorporating lifestyle medicine;  and documenting the encounter.    Please refer to Patient Instructions for Blood Glucose Monitoring and Insulin/Medications Dosing Guide"  in media tab for additional information. Please  also refer to " Patient Self Inventory" in the Media  tab for reviewed elements of pertinent patient history.  Latoya Walsh participated in the discussions, expressed understanding, and voiced agreement with the above plans.  All questions were answered to her satisfaction. she is encouraged to contact clinic should she have any questions or concerns prior to her return visit.     Follow up plan: - Return in about 1 week (around 11/11/2020) for Diabetes F/U, Bring meter and logs.    Rayetta Pigg, [facility] [address] [place], [place] [address] Phone: [phone] Fax: [phone]  11/04/2020, 11:31 AM

## 2020-11-08 ENCOUNTER — Telehealth: Payer: Self-pay | Admitting: Licensed Clinical Social Worker

## 2020-11-08 NOTE — Telephone Encounter (Signed)
LCSW received a call from [facility] who shares that pt has been doing well. She has had success with taking her medications w/ the bubble packs, meds will transition to [facility] by the end of the month and [name] is curious what that will look like. Pt is getting evaluated for dialysis and has an upcoming appt with [facility] in [place]. I will be able to coordinate this ride through [place] for pt.   I then called [facility] who confirm pt will get her medications still packaged. They will come in a roll in a box where pt can tear off the dose for each time of day she is to take the medications. [name] confirms she thinks pt would be able to manage that.   LCSW reached out to [facility] and requested appt to be scheduled for [facility] on 8/31. I remain available as needed for any additional questions/concerns that may arise.   Westley Hummer, MSW, [place]  [phone]

## 2020-11-09 ENCOUNTER — Other Ambulatory Visit: Payer: Self-pay | Admitting: Internal Medicine

## 2020-11-09 DIAGNOSIS — I10 Essential (primary) hypertension: Secondary | ICD-10-CM

## 2020-11-11 ENCOUNTER — Encounter: Payer: Self-pay | Admitting: Nurse Practitioner

## 2020-11-11 ENCOUNTER — Other Ambulatory Visit: Payer: Self-pay

## 2020-11-11 ENCOUNTER — Encounter: Payer: Medicaid Other | Admitting: Nurse Practitioner

## 2020-11-11 NOTE — Patient Instructions (Signed)
Diabetes Mellitus and Nutrition, Adult When you have diabetes, or diabetes mellitus, it is very important to have healthy eating habits because your blood sugar (glucose) levels are greatly affected by what you eat and drink. Eating healthy foods in the right amounts, at about the same times every day, can help you:  Control your blood glucose.  Lower your risk of heart disease.  Improve your blood pressure.  Reach or maintain a healthy weight. What can affect my meal plan? Every person with diabetes is different, and each person has different needs for a meal plan. Your health care provider may recommend that you work with a dietitian to make a meal plan that is best for you. Your meal plan may vary depending on factors such as:  The calories you need.  The medicines you take.  Your weight.  Your blood glucose, blood pressure, and cholesterol levels.  Your activity level.  Other health conditions you have, such as heart or kidney disease. How do carbohydrates affect me? Carbohydrates, also called carbs, affect your blood glucose level more than any other type of food. Eating carbs naturally raises the amount of glucose in your blood. Carb counting is a method for keeping track of how many carbs you eat. Counting carbs is important to keep your blood glucose at a healthy level, especially if you use insulin or take certain oral diabetes medicines. It is important to know how many carbs you can safely have in each meal. This is different for every person. Your dietitian can help you calculate how many carbs you should have at each meal and for each snack. How does alcohol affect me? Alcohol can cause a sudden decrease in blood glucose (hypoglycemia), especially if you use insulin or take certain oral diabetes medicines. Hypoglycemia can be a life-threatening condition. Symptoms of hypoglycemia, such as sleepiness, dizziness, and confusion, are similar to symptoms of having too much  alcohol.  Do not drink alcohol if: ? Your health care provider tells you not to drink. ? You are pregnant, may be pregnant, or are planning to become pregnant.  If you drink alcohol: ? Do not drink on an empty stomach. ? Limit how much you use to:  0-1 drink a day for women.  0-2 drinks a day for men. ? Be aware of how much alcohol is in your drink. In the [place] one drink equals one 12 oz bottle of beer (355 mL), one 5 oz glass of wine (148 mL), or one 1 oz glass of hard liquor (44 mL). ? Keep yourself hydrated with water, diet soda, or unsweetened iced tea.  Keep in mind that regular soda, juice, and other mixers may contain a lot of sugar and must be counted as carbs. What are tips for following this plan? Reading food labels  Start by checking the serving size on the "Nutrition Facts" label of packaged foods and drinks. The amount of calories, carbs, fats, and other nutrients listed on the label is based on one serving of the item. Many items contain more than one serving per package.  Check the total grams (g) of carbs in one serving. You can calculate the number of servings of carbs in one serving by dividing the total carbs by 15. For example, if a food has 30 g of total carbs per serving, it would be equal to 2 servings of carbs.  Check the number of grams (g) of saturated fats and trans fats in one serving. Choose foods that have   a low amount or none of these fats.  Check the number of milligrams (mg) of salt (sodium) in one serving. Most people should limit total sodium intake to less than 2,300 mg per day.  Always check the nutrition information of foods labeled as "low-fat" or "nonfat." These foods may be higher in added sugar or refined carbs and should be avoided.  Talk to your dietitian to identify your daily goals for nutrients listed on the label. Shopping  Avoid buying canned, pre-made, or processed foods. These foods tend to be high in fat, sodium, and added  sugar.  Shop around the outside edge of the grocery store. This is where you will most often find fresh fruits and vegetables, bulk grains, fresh meats, and fresh dairy. Cooking  Use low-heat cooking methods, such as baking, instead of high-heat cooking methods like deep frying.  Cook using healthy oils, such as olive, canola, or sunflower oil.  Avoid cooking with butter, cream, or high-fat meats. Meal planning  Eat meals and snacks regularly, preferably at the same times every day. Avoid going long periods of time without eating.  Eat foods that are high in fiber, such as fresh fruits, vegetables, beans, and whole grains. Talk with your dietitian about how many servings of carbs you can eat at each meal.  Eat 4-6 oz (112-168 g) of lean protein each day, such as lean meat, chicken, fish, eggs, or tofu. One ounce (oz) of lean protein is equal to: ? 1 oz (28 g) of meat, chicken, or fish. ? 1 egg. ?  cup (62 g) of tofu.  Eat some foods each day that contain healthy fats, such as avocado, nuts, seeds, and fish.   What foods should I eat? Fruits Berries. Apples. Oranges. Peaches. Apricots. Plums. Grapes. Mango. Papaya. Pomegranate. Kiwi. Cherries. Vegetables Lettuce. Spinach. Leafy greens, including kale, chard, collard greens, and mustard greens. Beets. Cauliflower. Cabbage. Broccoli. Carrots. Green beans. Tomatoes. Peppers. Onions. Cucumbers. Brussels sprouts. Grains Whole grains, such as whole-wheat or whole-grain bread, crackers, tortillas, cereal, and pasta. Unsweetened oatmeal. Quinoa. Brown or wild rice. Meats and other proteins Seafood. Poultry without skin. Lean cuts of poultry and beef. Tofu. Nuts. Seeds. Dairy Low-fat or fat-free dairy products such as milk, yogurt, and cheese. The items listed above may not be a complete list of foods and beverages you can eat. Contact a dietitian for more information. What foods should I avoid? Fruits Fruits canned with  syrup. Vegetables Canned vegetables. Frozen vegetables with butter or cream sauce. Grains Refined white flour and flour products such as bread, pasta, snack foods, and cereals. Avoid all processed foods. Meats and other proteins Fatty cuts of meat. Poultry with skin. Breaded or fried meats. Processed meat. Avoid saturated fats. Dairy Full-fat yogurt, cheese, or milk. Beverages Sweetened drinks, such as soda or iced tea. The items listed above may not be a complete list of foods and beverages you should avoid. Contact a dietitian for more information. Questions to ask a health care provider  Do I need to meet with a diabetes educator?  Do I need to meet with a dietitian?  What number can I call if I have questions?  When are the best times to check my blood glucose? Where to find more information:  American Diabetes Association: diabetes.org  Academy of Nutrition and Dietetics: www.eatright.org  National Institute of Diabetes and Digestive and Kidney Diseases: www.niddk.nih.gov  Association of Diabetes Care and Education Specialists: www.diabeteseducator.org Summary  It is important to have healthy eating   habits because your blood sugar (glucose) levels are greatly affected by what you eat and drink.  A healthy meal plan will help you control your blood glucose and maintain a healthy lifestyle.  Your health care provider may recommend that you work with a dietitian to make a meal plan that is best for you.  Keep in mind that carbohydrates (carbs) and alcohol have immediate effects on your blood glucose levels. It is important to count carbs and to use alcohol carefully. This information is not intended to replace advice given to you by your health care provider. Make sure you discuss any questions you have with your health care provider. Document Revised: 02/14/2019 Document Reviewed: 02/14/2019 Elsevier Patient Education  2021 Elsevier Inc.  

## 2020-11-12 ENCOUNTER — Other Ambulatory Visit: Payer: Self-pay

## 2020-11-12 ENCOUNTER — Other Ambulatory Visit: Payer: Self-pay | Admitting: *Deleted

## 2020-11-12 DIAGNOSIS — N184 Chronic kidney disease, stage 4 (severe): Secondary | ICD-10-CM

## 2020-11-12 NOTE — Patient Outreach (Signed)
Care Coordination  11/12/2020  Latoya Walsh 11-Sep-1960 [id number]   Medicaid Managed Care   Unsuccessful Outreach Note  11/12/2020 Name: Latoya Walsh MRN: [id number] DOB: 1961-02-04  Referred by: [name], MD Reason for referral : High Risk Managed Medicaid (Unsuccessful RNCM follow up telephone outreach)   An unsuccessful telephone outreach was attempted today. The patient was referred to the case management team for assistance with care management and care coordination.   Follow Up Plan: The care management team will reach out to the patient again over the next 21 days.   [name] RN, BSN [place]  [employer] manager

## 2020-11-12 NOTE — Patient Instructions (Signed)
Visit Information  [name]  - as a part of your Medicaid benefit, you are eligible for care management and care coordination services at no cost or copay. I was unable to reach you by phone today but would be happy to help you with your health related needs. Please feel free to call me @ [phone].   A member of the Managed Medicaid care management team will reach out to you again over the next 21 days.   [name] RN, BSN [place]  [employer] manager

## 2020-11-18 ENCOUNTER — Telehealth: Payer: Self-pay | Admitting: Licensed Clinical Social Worker

## 2020-11-18 NOTE — Telephone Encounter (Signed)
LCSW received a f/u from [facility], regarding pt upcoming ride for appt on 8/31. LCSW called and spoke with Monserrat at [facility] who was able to confirm pt ride. Ride scheduled for Wednesday 8/31 to get pt to 9:10am appt. Pt appt is roughly 20 minutes and transportation can take her home from [facility] in [place].  [name] will provide pt with a reminder of this plan. Remain available for any additional questions or concern that may arise.  Westley Hummer, MSW, [place]  [phone]

## 2020-11-19 ENCOUNTER — Telehealth: Payer: Self-pay | Admitting: Nurse Practitioner

## 2020-11-19 DIAGNOSIS — E1165 Type 2 diabetes mellitus with hyperglycemia: Secondary | ICD-10-CM

## 2020-11-19 MED ORDER — BLOOD GLUCOSE METER KIT: PACK | 0 refills | Status: DC

## 2020-11-19 NOTE — Telephone Encounter (Signed)
Pt is needing a meter and test supplies sent into the pharmacy. The program she is with supplied her some but they can not continue to supply her test strips every week therefore she is needing a prescription for the supplies.   [place], [place] Phone:  [phone]  Fax:  [phone]

## 2020-11-19 NOTE — Telephone Encounter (Signed)
Rx sent to [facility] 

## 2020-11-20 ENCOUNTER — Other Ambulatory Visit: Payer: Self-pay

## 2020-11-20 ENCOUNTER — Encounter: Payer: Self-pay | Admitting: Vascular Surgery

## 2020-11-20 ENCOUNTER — Ambulatory Visit (INDEPENDENT_AMBULATORY_CARE_PROVIDER_SITE_OTHER): Payer: Medicaid Other | Admitting: Vascular Surgery

## 2020-11-20 VITALS — BP 158/76 | HR 77 | Temp 97.9°F | Ht 67.0 in | Wt 179.4 lb

## 2020-11-20 DIAGNOSIS — N184 Chronic kidney disease, stage 4 (severe): Secondary | ICD-10-CM | POA: Diagnosis not present

## 2020-11-20 NOTE — Progress Notes (Signed)
  [facility]ist of [place]  Patient name: Latoya Walsh MRN: [id number] DOB: 09/02/1960 Sex: female  REASON FOR CONSULT: Discuss access for hemodialysis  Nephrologist: Bhutani  HPI: Latoya Walsh is a 59 y.o. female, who is here today for discussion of hemodialysis access.  She has a long history of renal insufficiency and is recently had marked deterioration of her renal function.  Creatinine had been in the 2.0-2.5 range and over the last several months has increased to 3.4 in July and 4.0 on October 31, 2020.  She does have a long history of type 2 diabetes.  Is felt that she may have COVID related ATN causing deterioration.  Her twin sister has a long history of hemodialysis so she is somewhat familiar with hemodialysis and access.  She reports that her sister is bilateral lower extremity amputee.  Apparently her sister currently has dialysis via catheter with failed other access options.  She is right-handed.  She is not on anticoagulation.  She does not have a pacemaker.  Past Medical History:  Diagnosis Date   Allergic rhinitis    Anxiety    CHF (congestive heart failure) (HCC)    a. EF 30-35% by echo in 05/2020   Chronic back pain    Chronic hepatitis C without hepatic coma (HCC)    COVID-19 virus infection 12/26/2019   Depression    Diabetes mellitus    Hepatitis C    Hypertension    Noncompliance    Poor appetite 07/17/2014   Substance abuse (HCC)    HX of drug use and alcohol use    Family History  Problem Relation Age of Onset   Diabetes Sister        Twin - AIDS     SOCIAL HISTORY: Social History   Socioeconomic History   Marital status: Single    Spouse name: Not on file   Number of children: 3   Years of education: Not on file   Highest education level: Not on file  Occupational History   Occupation: Disabled  Tobacco Use   Smoking status: Light Smoker    Packs/day: 0.25    Types: Cigarettes    Smokeless tobacco: Never  Vaping Use   Vaping Use: Never used  Substance and Sexual Activity   Alcohol use: Not Currently    Alcohol/week: 40.0 standard drinks    Types: 40 Cans of beer per week    Comment: 2 16oz cans of beer a day   Drug use: Not Currently    Types: Marijuana, Cocaine    Comment: Last smoked marijuana yesterday 10/18/14   Sexual activity: Yes    Birth control/protection: Condom  Other Topics Concern   Not on file  Social History Narrative   Not on file   Social Determinants of Health   Financial Resource Strain: Low Risk    Difficulty of Paying Living Expenses: Not hard at all  Food Insecurity: Food Insecurity Present   Worried About Running Out of Food in the Last Year: Sometimes true   Ran Out of Food in the Last Year: Sometimes true  Transportation Needs: No Transportation Needs   Lack of Transportation (Medical): No   Lack of Transportation (Non-Medical): No  Physical Activity: Not on file  Stress: Not on file  Social Connections: Not on file  Intimate Partner Violence: Not on file    Allergies  Allergen Reactions   Penicillins Itching and Rash    Current Outpatient Medications  Medication   Sig Dispense Refill   amLODipine (NORVASC) 5 MG tablet TAKE ONE TABLET BY MOUTH ONCE DAILY. 30 tablet 0   blood glucose meter kit and supplies Dispense based on patient and insurance preference. Once daily testing dx E11.9. 1 each 0   calcitRIOL (ROCALTROL) 0.25 MCG capsule Take 0.25 mcg by mouth daily.     furosemide (LASIX) 80 MG tablet Take 80 mg  (1 tablet) in the morning and 40 mg (half tablet) in the afternoon. 135 tablet 3   gabapentin (NEURONTIN) 100 MG capsule Take 1 capsule (100 mg total) by mouth 3 (three) times daily. 90 capsule 3   glipiZIDE (GLUCOTROL XL) 5 MG 24 hr tablet Take 5 mg by mouth daily with breakfast.     hydrALAZINE (APRESOLINE) 50 MG tablet TAKE ONE TABLET BY MOUTH EVERY 8 HOURS 90 tablet 3   insulin glargine (LANTUS SOLOSTAR) 100  UNIT/ML Solostar Pen Inject 10 Units into the skin at bedtime. 15 mL 1   Insulin Pen Needle (PEN NEEDLES) 31G X 5 MM MISC Use to inject insulin once daily 90 each 3   isosorbide mononitrate (IMDUR) 30 MG 24 hr tablet Take 1 tablet (30 mg total) by mouth daily. 90 tablet 3   metoprolol succinate (TOPROL-XL) 50 MG 24 hr tablet Take 1 tablet (50 mg total) by mouth daily. Take with or immediately following a meal. 90 tablet 3   mirtazapine (REMERON) 15 MG tablet TAKE (1) TABLET BY MOUTH AT BEDTIME. 30 tablet 0   rosuvastatin (CRESTOR) 10 MG tablet Take 1 tablet (10 mg total) by mouth daily. 90 tablet 3   sodium bicarbonate 650 MG tablet Take 650 mg by mouth 2 (two) times daily.     No current facility-administered medications for this visit.    REVIEW OF SYSTEMS:  [X] denotes positive finding, [ ] denotes negative finding Cardiac  Comments:  Chest pain or chest pressure:    Shortness of breath upon exertion:    Short of breath when lying flat:    Irregular heart rhythm:        Vascular    Pain in calf, thigh, or hip brought on by ambulation: x   Pain in feet at night that wakes you up from your sleep:     Blood clot in your veins:    Leg swelling:  x       Pulmonary    Oxygen at home:    Productive cough:  x   Wheezing:  x       Neurologic    Sudden weakness in arms or legs:     Sudden numbness in arms or legs:  x   Sudden onset of difficulty speaking or slurred speech:    Temporary loss of vision in one eye:     Problems with dizziness:         Gastrointestinal    Blood in stool:     Vomited blood:         Genitourinary    Burning when urinating:     Blood in urine:        Psychiatric    Major depression:         Hematologic    Bleeding problems:    Problems with blood clotting too easily:        Skin    Rashes or ulcers: x       Constitutional    Fever or chills: x     PHYSICAL EXAM: Vitals:   11/20/20  0853  BP: (!) 158/76  Pulse: 77  Temp: 97.9 F (36.6  C)  SpO2: 98%  Weight: 179 lb 6.4 oz (81.4 kg)  Height: 5' 7" (1.702 m)    GENERAL: The patient is a well-nourished female, in no acute distress. The vital signs are documented above. CARDIOVASCULAR: 2+ radial pulses bilaterally.  Small surface veins bilaterally. PULMONARY: There is good air exchange  MUSCULOSKELETAL: There are no major deformities or cyanosis. NEUROLOGIC: No focal weakness or paresthesias are detected. SKIN: There are no ulcers or rashes noted. PSYCHIATRIC: The patient has a normal affect.  DATA:  None  MEDICAL ISSUES: Had long discussion with patient regarding options for hemodialysis.  I explained that her access would not have any bearing on her need for dialysis.  Explained that the hope would be to have access ready prior to initiation.  I did explain the use of a catheter if she needs acute dialysis.  She is right-handed.  She has relatively small surface veins bilaterally.  She does have an old traumatic injury over her biceps on the right.  I have recommended left arm access as her first access.  We will image her veins with SonoSite ultrasound at the time of surgery but suspect this will be a Gore-Tex graft.  We have scheduled surgery for Tuesday, November 26, 2020 at [facility]   Todd F. Early, MD FACS [facility] [place] Office Tel [phone] Pager [phone]  Note: Portions of this report may have been transcribed using voice recognition software.  Every effort has been made to ensure accuracy; however, inadvertent computerized transcription errors may still be present.   

## 2020-11-20 NOTE — H&P (View-Only) (Signed)
[facility]ascular and Vein Specialist of [place]  Patient name: Latoya Walsh MRN: [id number] DOB: [birth year] Sex: female  REASON FOR CONSULT: Discuss access for hemodialysis  Nephrologist: Theador Hawthorne  HPI: STEVEN Walsh is a 60 y.o. female, who is here today for discussion of hemodialysis access.  She has a long history of renal insufficiency and is recently had marked deterioration of her renal function.  Creatinine had been in the 2.0-2.5 range and over the last several months has increased to 3.4 in July and 4.0 on October 31, 2020.  She does have a long history of type 2 diabetes.  Is felt that she may have COVID related ATN causing deterioration.  Her twin sister has a long history of hemodialysis so she is somewhat familiar with hemodialysis and access.  She reports that her sister is bilateral lower extremity amputee.  Apparently her sister currently has dialysis via catheter with failed other access options.  She is right-handed.  She is not on anticoagulation.  She does not have a pacemaker.  Past Medical History:  Diagnosis Date   Allergic rhinitis    Anxiety    CHF (congestive heart failure) (HCC)    a. EF 30-35% by echo in 05/2020   Chronic back pain    Chronic hepatitis C without hepatic coma ([place])    COVID-19 virus infection 12/26/2019   Depression    Diabetes mellitus    Hepatitis C    Hypertension    Noncompliance    Poor appetite 07/17/2014   Substance abuse ([place])    HX of drug use and alcohol use    Family History  Problem Relation Age of Onset   Diabetes Sister        Twin - AIDS     SOCIAL HISTORY: Social History   Socioeconomic History   Marital status: Single    Spouse name: Not on file   Number of children: 3   Years of education: Not on file   Highest education level: Not on file  Occupational History   Occupation: Disabled  Tobacco Use   Smoking status: Light Smoker    Packs/day: 0.25    Types: Cigarettes    Smokeless tobacco: Never  Vaping Use   Vaping Use: Never used  Substance and Sexual Activity   Alcohol use: Not Currently    Alcohol/week: 40.0 standard drinks    Types: 40 Cans of beer per week    Comment: 2 16oz cans of beer a day   Drug use: Not Currently    Types: Marijuana, Cocaine    Comment: Last smoked marijuana yesterday 10/18/14   Sexual activity: Yes    Birth control/protection: Condom  Other Topics Concern   Not on file  Social History Narrative   Not on file   Social Determinants of Health   Financial Resource Strain: Low Risk    Difficulty of Paying Living Expenses: Not hard at all  Food Insecurity: Food Insecurity Present   Worried About Charity fundraiser in the Last Year: Sometimes true   Arboriculturist in the Last Year: Sometimes true  Transportation Needs: No Transportation Needs   Lack of Transportation (Medical): No   Lack of Transportation (Non-Medical): No  Physical Activity: Not on file  Stress: Not on file  Social Connections: Not on file  Intimate Partner Violence: Not on file    Allergies  Allergen Reactions   Penicillins Itching and Rash    Current Outpatient Medications  Medication  Sig Dispense Refill   amLODipine (NORVASC) 5 MG tablet TAKE ONE TABLET BY MOUTH ONCE DAILY. 30 tablet 0   blood glucose meter kit and supplies Dispense based on patient and insurance preference. Once daily testing dx E11.9. 1 each 0   calcitRIOL (ROCALTROL) 0.25 MCG capsule Take 0.25 mcg by mouth daily.     furosemide (LASIX) 80 MG tablet Take 80 mg  (1 tablet) in the morning and 40 mg (half tablet) in the afternoon. 135 tablet 3   gabapentin (NEURONTIN) 100 MG capsule Take 1 capsule (100 mg total) by mouth 3 (three) times daily. 90 capsule 3   glipiZIDE (GLUCOTROL XL) 5 MG 24 hr tablet Take 5 mg by mouth daily with breakfast.     hydrALAZINE (APRESOLINE) 50 MG tablet TAKE ONE TABLET BY MOUTH EVERY 8 HOURS 90 tablet 3   insulin glargine (LANTUS SOLOSTAR) 100  UNIT/ML Solostar Pen Inject 10 Units into the skin at bedtime. 15 mL 1   Insulin Pen Needle (PEN NEEDLES) 31G X 5 MM MISC Use to inject insulin once daily 90 each 3   isosorbide mononitrate (IMDUR) 30 MG 24 hr tablet Take 1 tablet (30 mg total) by mouth daily. 90 tablet 3   metoprolol succinate (TOPROL-XL) 50 MG 24 hr tablet Take 1 tablet (50 mg total) by mouth daily. Take with or immediately following a meal. 90 tablet 3   mirtazapine (REMERON) 15 MG tablet TAKE (1) TABLET BY MOUTH AT BEDTIME. 30 tablet 0   rosuvastatin (CRESTOR) 10 MG tablet Take 1 tablet (10 mg total) by mouth daily. 90 tablet 3   sodium bicarbonate 650 MG tablet Take 650 mg by mouth 2 (two) times daily.     No current facility-administered medications for this visit.    REVIEW OF SYSTEMS:  [X] denotes positive finding, [ ] denotes negative finding Cardiac  Comments:  Chest pain or chest pressure:    Shortness of breath upon exertion:    Short of breath when lying flat:    Irregular heart rhythm:        Vascular    Pain in calf, thigh, or hip brought on by ambulation: x   Pain in feet at night that wakes you up from your sleep:     Blood clot in your veins:    Leg swelling:  x       Pulmonary    Oxygen at home:    Productive cough:  x   Wheezing:  x       Neurologic    Sudden weakness in arms or legs:     Sudden numbness in arms or legs:  x   Sudden onset of difficulty speaking or slurred speech:    Temporary loss of vision in one eye:     Problems with dizziness:         Gastrointestinal    Blood in stool:     Vomited blood:         Genitourinary    Burning when urinating:     Blood in urine:        Psychiatric    Major depression:         Hematologic    Bleeding problems:    Problems with blood clotting too easily:        Skin    Rashes or ulcers: x       Constitutional    Fever or chills: x     PHYSICAL EXAM: Vitals:   11/20/20  0853  BP: (!) 158/76  Pulse: 77  Temp: 97.9 F (36.6  C)  SpO2: 98%  Weight: 179 lb 6.4 oz (81.4 kg)  Height: _0  (1.702 m)    GENERAL: The patient is a well-nourished female, in no acute distress. The vital signs are documented above. CARDIOVASCULAR: 2+ radial pulses bilaterally.  Small surface veins bilaterally. PULMONARY: There is good air exchange  MUSCULOSKELETAL: There are no major deformities or cyanosis. NEUROLOGIC: No focal weakness or paresthesias are detected. SKIN: There are no ulcers or rashes noted. PSYCHIATRIC: The patient has a normal affect.  DATA:  None  MEDICAL ISSUES: Had long discussion with patient regarding options for hemodialysis.  I explained that her access would not have any bearing on her need for dialysis.  Explained that the hope would be to have access ready prior to initiation.  I did explain the use of a catheter if she needs acute dialysis.  She is right-handed.  She has relatively small surface veins bilaterally.  She does have an old traumatic injury over her biceps on the right.  I have recommended left arm access as her first access.  We will image her veins with SonoSite ultrasound at the time of surgery but suspect this will be a Gore-Tex graft.  We have scheduled surgery for Tuesday, November 26, 2020 at [place] Tel [phone] Pager [phone]  Note: Portions of this report may have been transcribed using voice recognition software.  Every effort has been made to ensure accuracy; however, inadvertent computerized transcription errors may still be present.

## 2020-11-21 ENCOUNTER — Telehealth: Payer: Self-pay | Admitting: Licensed Clinical Social Worker

## 2020-11-21 NOTE — Telephone Encounter (Signed)
LCSW received call back from [facility], clarification received that preadmission appt tomorrow at 4:15pm is via telephone, she does not need to come in person. Her fistula placement is on 9/6 and she needs to arrive to [facility] (main entrance) by 10:30am. I attempted to call [name] back with that information. No answer at [phone] and unable to leave voicemail. I sent a text message with the above information and asked her to call me back if she needs a ride to her appointment. I also communicated the above to community paramedic [name] who will also attempt to reach pt.   If pt needs a ride to the procedure on the 6th then I can schedule it today or tomorrow; offices closed Monday 9/5 for the holiday.   Westley Hummer, MSW, [place]  [phone]

## 2020-11-21 NOTE — Pre-Procedure Instructions (Signed)
Phone call placed to Latoya Walsh, MSW,LSSW to inforn her that Brycelynn's preop tomorrow 9/2, is a phone call. Informed her of arrival time for surgery 9/6 at 1030 am.

## 2020-11-21 NOTE — Telephone Encounter (Signed)
[name] was able to connect with pt and clarify with her plan for upcoming appointments. Pt inquiring if she will need to stay overnight for fistula placement. Shared with [name] that likely they would keep her overnight for observation. I will schedule a ride for 9/6 as requested, I plan to confirm if pt will need to have stay at hospital after procedure with [facility] office tomorrow.   Westley Hummer, MSW, [place]  [phone]

## 2020-11-21 NOTE — Telephone Encounter (Signed)
LCSW noted preadmission testing scheduled for tomorrow 9/2 at 4:15pm.  Called pt to ensure she has a ride to that, pt states she has the 6th down on her papers but will need a ride if it is tomorrow. LCSW has called preadmission testing at [facility] [phone]) to f/u to ensure appointment scheduled for tomorrow before arranging transportation.   Westley Hummer, MSW, [place]  [phone]

## 2020-11-22 ENCOUNTER — Other Ambulatory Visit: Payer: Self-pay

## 2020-11-22 ENCOUNTER — Encounter (HOSPITAL_COMMUNITY)
Admission: RE | Admit: 2020-11-22 | Discharge: 2020-11-22 | Disposition: A | Discharge status: Home / Self Care | Payer: Medicaid Other | Source: Ambulatory Visit | Attending: Vascular Surgery | Admitting: Vascular Surgery

## 2020-11-22 NOTE — Telephone Encounter (Signed)
LCSW reached out to [facility] office, they share that they had completed instructions with pt about procedure which included that pt will need someone to pick her up and be with her after the procedure. LCSW passed this on to [place], paramedic. She is unsure if someone would be able to stay with her. The nurse also reaced out directly to pt and went over instructions again- pt states her daughter will be able to pick her up and stay with her. I have arranged a ride to take her there the morning of the procedure.   Westley Hummer, MSW, [place]  [phone]

## 2020-11-26 ENCOUNTER — Encounter (HOSPITAL_COMMUNITY): Payer: Self-pay | Admitting: Vascular Surgery

## 2020-11-26 ENCOUNTER — Encounter (HOSPITAL_COMMUNITY)
Admission: RE | Disposition: A | Discharge status: Home / Self Care | Payer: Self-pay | Source: Home / Self Care | Attending: Vascular Surgery

## 2020-11-26 ENCOUNTER — Ambulatory Visit (HOSPITAL_COMMUNITY): Payer: Medicaid Other | Admitting: Anesthesiology

## 2020-11-26 ENCOUNTER — Ambulatory Visit (HOSPITAL_COMMUNITY)
Admission: RE | Admit: 2020-11-26 | Discharge: 2020-11-26 | Disposition: A | Discharge status: Home / Self Care | Payer: Medicaid Other | Attending: Vascular Surgery | Admitting: Vascular Surgery

## 2020-11-26 DIAGNOSIS — I130 Hypertensive heart and chronic kidney disease with heart failure and stage 1 through stage 4 chronic kidney disease, or unspecified chronic kidney disease: Secondary | ICD-10-CM | POA: Diagnosis not present

## 2020-11-26 DIAGNOSIS — E1165 Type 2 diabetes mellitus with hyperglycemia: Secondary | ICD-10-CM | POA: Diagnosis not present

## 2020-11-26 DIAGNOSIS — N186 End stage renal disease: Secondary | ICD-10-CM | POA: Diagnosis not present

## 2020-11-26 DIAGNOSIS — Z794 Long term (current) use of insulin: Secondary | ICD-10-CM | POA: Insufficient documentation

## 2020-11-26 DIAGNOSIS — Z8616 Personal history of COVID-19: Secondary | ICD-10-CM | POA: Diagnosis not present

## 2020-11-26 DIAGNOSIS — F17210 Nicotine dependence, cigarettes, uncomplicated: Secondary | ICD-10-CM | POA: Insufficient documentation

## 2020-11-26 DIAGNOSIS — Z7984 Long term (current) use of oral hypoglycemic drugs: Secondary | ICD-10-CM | POA: Insufficient documentation

## 2020-11-26 DIAGNOSIS — N189 Chronic kidney disease, unspecified: Secondary | ICD-10-CM | POA: Diagnosis not present

## 2020-11-26 DIAGNOSIS — I509 Heart failure, unspecified: Secondary | ICD-10-CM | POA: Diagnosis not present

## 2020-11-26 DIAGNOSIS — E1122 Type 2 diabetes mellitus with diabetic chronic kidney disease: Secondary | ICD-10-CM | POA: Diagnosis not present

## 2020-11-26 DIAGNOSIS — Z880 Allergy status to penicillin: Secondary | ICD-10-CM | POA: Insufficient documentation

## 2020-11-26 DIAGNOSIS — N184 Chronic kidney disease, stage 4 (severe): Secondary | ICD-10-CM | POA: Insufficient documentation

## 2020-11-26 DIAGNOSIS — Z79899 Other long term (current) drug therapy: Secondary | ICD-10-CM | POA: Diagnosis not present

## 2020-11-26 DIAGNOSIS — Z992 Dependence on renal dialysis: Secondary | ICD-10-CM | POA: Diagnosis not present

## 2020-11-26 HISTORY — PX: AV FISTULA PLACEMENT: SHX1204

## 2020-11-26 LAB — POCT I-STAT, CHEM 8
BUN: 41 mg/dL — ABNORMAL HIGH (ref 6–20)
Calcium, Ion: 1.13 mmol/L — ABNORMAL LOW (ref 1.15–1.40)
Chloride: 111 mmol/L (ref 98–111)
Creatinine, Ser: 4.3 mg/dL — ABNORMAL HIGH (ref 0.44–1.00)
Glucose, Bld: 174 mg/dL — ABNORMAL HIGH (ref 70–99)
HCT: 30 % — ABNORMAL LOW (ref 36.0–46.0)
Hemoglobin: 10.2 g/dL — ABNORMAL LOW (ref 12.0–15.0)
Potassium: 4.3 mmol/L (ref 3.5–5.1)
Sodium: 143 mmol/L (ref 135–145)
TCO2: 22 mmol/L (ref 22–32)

## 2020-11-26 LAB — GLUCOSE, CAPILLARY: Glucose-Capillary: 163 mg/dL — ABNORMAL HIGH (ref 70–99)

## 2020-11-26 SURGERY — ARTERIOVENOUS (AV) FISTULA CREATION
Anesthesia: Monitor Anesthesia Care | Site: Arm Lower | Laterality: Left

## 2020-11-26 MED ORDER — ORAL CARE MOUTH RINSE: 15.0000 mL | Freq: Once | OROMUCOSAL | Status: AC

## 2020-11-26 MED ORDER — CHLORHEXIDINE GLUCONATE 4 % EX LIQD
60.0000 mL | Freq: Once | CUTANEOUS | Status: AC
  Administered 2020-11-26: 4 via TOPICAL

## 2020-11-26 MED ORDER — DIPHENHYDRAMINE HCL 50 MG/ML IJ SOLN
INTRAMUSCULAR | Status: DC | PRN
  Administered 2020-11-26: 12.5 mg via INTRAVENOUS

## 2020-11-26 MED ORDER — LIDOCAINE-EPINEPHRINE 0.5 %-1:200000 IJ SOLN
INTRAMUSCULAR | Status: AC
  Filled 2020-11-26: qty 1

## 2020-11-26 MED ORDER — PROPOFOL 10 MG/ML IV BOLUS
INTRAVENOUS | Status: AC
  Filled 2020-11-26: qty 20

## 2020-11-26 MED ORDER — SODIUM CHLORIDE 0.9 % IV SOLN: INTRAVENOUS | Status: DC

## 2020-11-26 MED ORDER — FENTANYL CITRATE (PF) 100 MCG/2ML IJ SOLN
INTRAMUSCULAR | Status: DC | PRN
  Administered 2020-11-26: 25 ug via INTRAVENOUS
  Administered 2020-11-26: 50 ug via INTRAVENOUS
  Administered 2020-11-26: 25 ug via INTRAVENOUS

## 2020-11-26 MED ORDER — DIPHENHYDRAMINE HCL 50 MG/ML IJ SOLN
INTRAMUSCULAR | Status: AC
  Filled 2020-11-26: qty 1

## 2020-11-26 MED ORDER — VANCOMYCIN HCL IN DEXTROSE 1-5 GM/200ML-% IV SOLN
1000.0000 mg | INTRAVENOUS | Status: AC
  Administered 2020-11-26: 1000 mg via INTRAVENOUS
  Filled 2020-11-26: qty 200

## 2020-11-26 MED ORDER — HEPARIN SODIUM (PORCINE) 1000 UNIT/ML IJ SOLN
INTRAMUSCULAR | Status: AC
  Filled 2020-11-26: qty 6

## 2020-11-26 MED ORDER — DEXMEDETOMIDINE (PRECEDEX) IN NS 20 MCG/5ML (4 MCG/ML) IV SYRINGE
PREFILLED_SYRINGE | INTRAVENOUS | Status: AC
  Filled 2020-11-26: qty 5

## 2020-11-26 MED ORDER — DEXMEDETOMIDINE (PRECEDEX) IN NS 20 MCG/5ML (4 MCG/ML) IV SYRINGE
PREFILLED_SYRINGE | INTRAVENOUS | Status: DC | PRN
  Administered 2020-11-26 (×3): 4 ug via INTRAVENOUS

## 2020-11-26 MED ORDER — OXYCODONE-ACETAMINOPHEN 5-325 MG PO TABS: 1.0000 | ORAL_TABLET | Freq: Four times a day (QID) | ORAL | 0 refills | Status: DC | PRN

## 2020-11-26 MED ORDER — FENTANYL CITRATE PF 50 MCG/ML IJ SOSY: 25.0000 ug | PREFILLED_SYRINGE | INTRAMUSCULAR | Status: DC | PRN

## 2020-11-26 MED ORDER — KETAMINE HCL 50 MG/5ML IJ SOSY
PREFILLED_SYRINGE | INTRAMUSCULAR | Status: AC
  Filled 2020-11-26: qty 5

## 2020-11-26 MED ORDER — KETAMINE HCL 10 MG/ML IJ SOLN
INTRAMUSCULAR | Status: DC | PRN
  Administered 2020-11-26 (×6): 5 mg via INTRAVENOUS

## 2020-11-26 MED ORDER — MIDAZOLAM HCL 5 MG/5ML IJ SOLN
INTRAMUSCULAR | Status: DC | PRN
  Administered 2020-11-26: 1 mg via INTRAVENOUS

## 2020-11-26 MED ORDER — CHLORHEXIDINE GLUCONATE 0.12 % MT SOLN
15.0000 mL | Freq: Once | OROMUCOSAL | Status: AC
  Administered 2020-11-26: 15 mL via OROMUCOSAL
  Filled 2020-11-26: qty 15

## 2020-11-26 MED ORDER — CHLORHEXIDINE GLUCONATE 4 % EX LIQD: 60.0000 mL | Freq: Once | CUTANEOUS | Status: DC

## 2020-11-26 MED ORDER — FENTANYL CITRATE (PF) 100 MCG/2ML IJ SOLN
INTRAMUSCULAR | Status: AC
  Filled 2020-11-26: qty 2

## 2020-11-26 MED ORDER — 0.9 % SODIUM CHLORIDE (POUR BTL) OPTIME
TOPICAL | Status: DC | PRN
  Administered 2020-11-26: 1000 mL

## 2020-11-26 MED ORDER — ONDANSETRON HCL 4 MG/2ML IJ SOLN: 4.0000 mg | Freq: Once | INTRAMUSCULAR | Status: DC | PRN

## 2020-11-26 MED ORDER — LIDOCAINE-EPINEPHRINE 0.5 %-1:200000 IJ SOLN
INTRAMUSCULAR | Status: DC | PRN
  Administered 2020-11-26: 5 mL

## 2020-11-26 MED ORDER — MIDAZOLAM HCL 2 MG/2ML IJ SOLN
INTRAMUSCULAR | Status: AC
  Filled 2020-11-26: qty 2

## 2020-11-26 MED ORDER — PROPOFOL 500 MG/50ML IV EMUL
INTRAVENOUS | Status: DC | PRN
  Administered 2020-11-26: 50 ug/kg/min via INTRAVENOUS

## 2020-11-26 MED ORDER — HEPARIN 6000 UNIT IRRIGATION SOLUTION
Status: DC | PRN
  Administered 2020-11-26: 1

## 2020-11-26 SURGICAL SUPPLY — 42 items
ADH SKN CLS APL DERMABOND .7 (GAUZE/BANDAGES/DRESSINGS) ×1
ARMBAND PINK RESTRICT EXTREMIT (MISCELLANEOUS) ×2 IMPLANT
BAG HAMPER (MISCELLANEOUS) ×2 IMPLANT
CANNULA VESSEL 3MM 2 BLNT TIP (CANNULA) ×2 IMPLANT
CLIP LIGATING EXTRA MED SLVR (CLIP) ×2 IMPLANT
CLIP LIGATING EXTRA SM BLUE (MISCELLANEOUS) ×2 IMPLANT
COVER LIGHT HANDLE STERIS (MISCELLANEOUS) ×4 IMPLANT
COVER MAYO STAND XLG (MISCELLANEOUS) ×2 IMPLANT
COVER PROBE U/S 5X48 (MISCELLANEOUS) IMPLANT
DECANTER SPIKE VIAL GLASS SM (MISCELLANEOUS) ×2 IMPLANT
DERMABOND ADVANCED (GAUZE/BANDAGES/DRESSINGS) ×1
DERMABOND ADVANCED .7 DNX12 (GAUZE/BANDAGES/DRESSINGS) ×1 IMPLANT
ELECT REM PT RETURN 9FT ADLT (ELECTROSURGICAL) ×2
ELECTRODE REM PT RTRN 9FT ADLT (ELECTROSURGICAL) ×1 IMPLANT
GAUZE SPONGE 4X4 12PLY STRL (GAUZE/BANDAGES/DRESSINGS) ×2 IMPLANT
GLOVE SS BIOGEL STRL SZ 7.5 (GLOVE) ×1 IMPLANT
GLOVE SUPERSENSE BIOGEL SZ 7.5 (GLOVE) ×1
GLOVE SURG UNDER POLY LF SZ7 (GLOVE) ×6 IMPLANT
GOWN STRL REUS W/TWL LRG LVL3 (GOWN DISPOSABLE) ×6 IMPLANT
IV NS 500ML (IV SOLUTION) ×2
IV NS 500ML BAXH (IV SOLUTION) ×1 IMPLANT
KIT BLADEGUARD II DBL (SET/KITS/TRAYS/PACK) ×2 IMPLANT
KIT TURNOVER KIT A (KITS) ×2 IMPLANT
MANIFOLD NEPTUNE II (INSTRUMENTS) ×2 IMPLANT
MARKER SKIN DUAL TIP RULER LAB (MISCELLANEOUS) ×4 IMPLANT
NDL HYPO 18GX1.5 BLUNT FILL (NEEDLE) ×1 IMPLANT
NEEDLE HYPO 18GX1.5 BLUNT FILL (NEEDLE) ×2 IMPLANT
NS IRRIG 1000ML POUR BTL (IV SOLUTION) ×2 IMPLANT
PACK CV ACCESS (CUSTOM PROCEDURE TRAY) ×2 IMPLANT
PAD ARMBOARD 7.5X6 YLW CONV (MISCELLANEOUS) ×2 IMPLANT
SET BASIN LINEN APH (SET/KITS/TRAYS/PACK) ×2 IMPLANT
SOL PREP POV-IOD 4OZ 10% (MISCELLANEOUS) ×2 IMPLANT
SOL PREP PROV IODINE SCRUB 4OZ (MISCELLANEOUS) ×2 IMPLANT
SPONGE T-LAP 18X18 ~~LOC~~+RFID (SPONGE) ×2 IMPLANT
STOCKINETTE IMPERVIOUS LG (DRAPES) ×1 IMPLANT
SUT PROLENE 6 0 CC (SUTURE) ×4 IMPLANT
SUT SILK 2 0 SH (SUTURE) ×1 IMPLANT
SUT VIC AB 3-0 SH 27 (SUTURE) ×2
SUT VIC AB 3-0 SH 27X BRD (SUTURE) ×1 IMPLANT
SYR 10ML LL (SYRINGE) ×2 IMPLANT
SYR 50ML LL SCALE MARK (SYRINGE) ×2 IMPLANT
UNDERPAD 30X36 HEAVY ABSORB (UNDERPADS AND DIAPERS) ×2 IMPLANT

## 2020-11-26 NOTE — Op Note (Signed)
    OPERATIVE REPORT  DATE OF SURGERY: 11/26/2020  PATIENT: Latoya Walsh, 60 y.o. female MRN: [id number]  DOB: Oct 28, 1960  PRE-OPERATIVE DIAGNOSIS: Chronic renal insufficiency  POST-OPERATIVE DIAGNOSIS:  Same  PROCEDURE: Left radiocephalic AV fistula creation  SURGEON:  Curt Jews, M.D.  PHYSICIAN ASSISTANT: Nurse  The assistant was needed for exposure and to expedite the case  ANESTHESIA: Local with sedation  EBL: per anesthesia record  Total I/O In: 700 [I.V.:500; IV Piggyback:200] Out: 10 [Blood:10]  BLOOD ADMINISTERED: none  DRAINS: none  SPECIMEN: none  COUNTS CORRECT:  YES  PATIENT DISPOSITION:  PACU - hemodynamically stable  PROCEDURE DETAILS: The patient was taken operating placed supine position where the area of the left arm was prepped draped you sterile fashion.  SonoSite ultrasound was used to visualize the veins.  The patient had a moderate size cephalic vein at the wrist which was visible and throughout her forearm.  The vein became smaller above the antecubital space.  Using local anesthesia, incision was made between the level of the cephalic vein and the radial artery at the wrist.  The vein was mobilized proximally and distally and tributary branches were ligated with 4-0 silk ties and divided.  The vein was ligated distally and divided and was brought into approximation of the radial artery.  The radial artery was small but had minimal atherosclerotic change.  The artery was occluded proximally distally and was opened with an 11 blade and sent longitudinally with Potts scissors.  The vein was cut to the appropriate length and was spatulated and sewn end-to-side to the artery with a running 6-0 Prolene suture.  A 2 dilator passed through the proximal distal portion of the anastomosis prior to completion of the closure.  The anastomosis was completed and the clamps were removed with excellent thrill noted.  The wounds were irrigated with saline.  Hemostasis  attained after cautery.  The wounds were closed with 3-0 Vicryl in the subcutaneous and subcuticular tissue.  Sterile dressing was applied and the patient was transferred to the recovery room in stable condition   [name], M.D., [facility] 11/26/2020 1:59 PM  Note: Portions of this report may have been transcribed using voice recognition software.  Every effort has been made to ensure accuracy; however, inadvertent computerized transcription errors may still be present.

## 2020-11-26 NOTE — Transfer of Care (Signed)
Immediate Anesthesia Transfer of Care Note  Patient: Latoya Walsh  Procedure(s) Performed: LEFT ARM ARTERIOVENOUS (AV) FISTULA CREATION (Left: Arm Lower)  Patient Location: PACU  Anesthesia Type:MAC  Level of Consciousness: drowsy and patient cooperative  Airway & Oxygen Therapy: Patient Spontanous Breathing  Post-op Assessment: Report given to RN and Post -op Vital signs reviewed and stable  Post vital signs: Reviewed and stable  Last Vitals:  Vitals Value Taken Time  BP 134/64 11/26/20 1345  Temp 36.7 C 11/26/20 1345  Pulse 71 11/26/20 1346  Resp 18 11/26/20 1346  SpO2 93 % 11/26/20 1346  Vitals shown include unvalidated device data.  Last Pain:  Vitals:   11/26/20 1037  TempSrc: Oral  PainSc: 0-No pain         Complications: No notable events documented.

## 2020-11-26 NOTE — Anesthesia Preprocedure Evaluation (Signed)
Anesthesia Evaluation  Patient identified by MRN, date of birth, ID band Patient awake    Reviewed: Allergy & Precautions, NPO status , Patient's Chart, lab work & pertinent test results, reviewed documented beta blocker date and time   History of Anesthesia Complications Negative for: history of anesthetic complications  Airway Mallampati: II  TM Distance: >3 FB Neck ROM: Full    Dental  (+) Dental Advisory Given, Loose, Chipped, Missing,    Pulmonary Current Smoker,    Pulmonary exam normal breath sounds clear to auscultation       Cardiovascular hypertension, Pt. on home beta blockers and Pt. on medications +CHF  Normal cardiovascular exam Rhythm:Regular Rate:Normal     Neuro/Psych  Headaches, PSYCHIATRIC DISORDERS Anxiety Depression  Neuromuscular disease    GI/Hepatic negative GI ROS, (+)     substance abuse  alcohol use and cocaine use, Hepatitis -, C  Endo/Other  diabetes, Poorly Controlled, Type 2, Insulin Dependent  Renal/GU      Musculoskeletal   Abdominal   Peds  Hematology  (+) anemia ,   Anesthesia Other Findings Please let the patient know the pumping function of her heart has improved! Her EF was previously at 30-35% in 05/2020, now at 45% (normal is 55-65%). 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 Keep scheduled follow-up as we will see if any of her cardiac medications can be increased at that time to help with the pumping function of her heart.   Reproductive/Obstetrics                            Anesthesia Physical Anesthesia Plan  ASA: 4  Anesthesia Plan: MAC   Post-op Pain Management:    Induction: Intravenous  PONV Risk Score and Plan: TIVA  Airway Management Planned: Nasal Cannula, Natural Airway and Simple Face Mask  Additional Equipment:   Intra-op Plan:   Post-operative Plan:   Informed Consent: I  have reviewed the patients History and Physical, chart, labs and discussed the procedure including the risks, benefits and alternatives for the proposed anesthesia with the patient or authorized representative who has indicated his/her understanding and acceptance.     Dental advisory given  Plan Discussed with: CRNA and Surgeon  Anesthesia Plan Comments: (Possible GA with airway was discussed.)        Anesthesia Quick Evaluation

## 2020-11-26 NOTE — Anesthesia Postprocedure Evaluation (Signed)
Anesthesia Post Note  Patient: MY RINKE  Procedure(s) Performed: LEFT ARM ARTERIOVENOUS (AV) FISTULA CREATION (Left: Arm Lower)  Patient location during evaluation: PACU Anesthesia Type: MAC Level of consciousness: awake and alert and oriented Pain management: pain level controlled Vital Signs Assessment: post-procedure vital signs reviewed and stable Respiratory status: spontaneous breathing and respiratory function stable Cardiovascular status: blood pressure returned to baseline and stable Postop Assessment: no apparent nausea or vomiting Anesthetic complications: no   No notable events documented.   Last Vitals:  Vitals:   11/26/20 1415 11/26/20 1430  BP: (!) 150/72 (!) 149/74  Pulse: 65 67  Resp: 12 12  Temp:    SpO2: 94% 97%    Last Pain:  Vitals:   11/26/20 1415  TempSrc:   PainSc: 0-No pain                 Mikaella Escalona C Delaila Nand

## 2020-11-26 NOTE — Discharge Instructions (Signed)
[facility]  Discharge Instructions  AV Fistula or Graft Surgery for Dialysis Access  Please refer to the following instructions for your post-procedure care. Your surgeon or physician assistant will discuss any changes with you.  Activity  You may drive the day following your surgery, if you are comfortable and no longer taking prescription pain medication. Resume full activity as the soreness in your incision resolves.  Bathing/Showering  You may shower after you go home. Keep your incision dry for 48 hours. Do not soak in a bathtub, hot tub, or swim until the incision heals completely. You may not shower if you have a hemodialysis catheter.  Incision Care  Clean your incision with mild soap and water after 48 hours. Pat the area dry with a clean towel. You do not need a bandage unless otherwise instructed. Do not apply any ointments or creams to your incision. You may have skin glue on your incision. Do not peel it off. It will come off on its own in about one week. Your arm may swell a bit after surgery. To reduce swelling use pillows to elevate your arm so it is above your heart. Your doctor will tell you if you need to lightly wrap your arm with an ACE bandage.  Diet  Resume your normal diet. There are not special food restrictions following this procedure. In order to heal from your surgery, it is CRITICAL to get adequate nutrition. Your body requires vitamins, minerals, and protein. Vegetables are the best source of vitamins and minerals. Vegetables also provide the perfect balance of protein. Processed food has little nutritional value, so try to avoid this.  Medications  Resume taking all of your medications. If your incision is causing pain, you may take over-the counter pain relievers such as acetaminophen (Tylenol). If you were prescribed a stronger pain medication, please be aware these medications can cause nausea and constipation. Prevent  nausea by taking the medication with a snack or meal. Avoid constipation by drinking plenty of fluids and eating foods with high amount of fiber, such as fruits, vegetables, and grains.  Do not take Tylenol if you are taking prescription pain medications.  Follow up Your surgeon may want to see you in the office following your access surgery. If so, this will be arranged at the time of your surgery.  Please call us immediately for any of the following conditions:  Increased pain, redness, drainage (pus) from your incision site Fever of 101 degrees or higher Severe or worsening pain at your incision site Hand pain or numbness.  Reduce your risk of vascular disease:  Stop smoking. If you would like help, call [facility] at 1-800-QUIT-NOW 330-447-3065) or [place] [facility] [place] your cholesterol Maintain a desired weight Control your diabetes Keep your blood pressure down  Dialysis  It will take several weeks to several months for your new dialysis access to be ready for use. Your surgeon will determine when it is okay to use it. Your nephrologist will continue to direct your dialysis. You can continue to use your Permcath until your new access is ready for use.   11/26/2020 Latoya Walsh [id number] Jun 26, 1960  Surgeon(s): Starlit Raburn, Arvilla Meres, MD  Procedure(s): LEFT ARM ARTERIOVENOUS (AV) FISTULA CREATION   May stick graft immediately   May stick graft on designated area only:    Do not stick fistula for 12 weeks    If you have any questions, please call the office at [phone].

## 2020-11-26 NOTE — Interval H&P Note (Signed)
History and Physical Interval Note:  11/26/2020 11:01 AM  Latoya Walsh  has presented today for surgery, with the diagnosis of CKD IV.  The various methods of treatment have been discussed with the patient and family. After consideration of risks, benefits and other options for treatment, the patient has consented to  Procedure(s): LEFT ARM ARTERIOVENOUS (AV) FISTULA VERSUS ARTERIOVENOUS GRAFT CREATION (Left) as a surgical intervention.  The patient's history has been reviewed, patient examined, no change in status, stable for surgery.  I have reviewed the patient's chart and labs.  Questions were answered to the patient's satisfaction.     Curt Jews

## 2020-11-27 ENCOUNTER — Encounter (HOSPITAL_COMMUNITY): Payer: Self-pay | Admitting: Vascular Surgery

## 2020-11-28 ENCOUNTER — Other Ambulatory Visit: Payer: Self-pay | Admitting: *Deleted

## 2020-11-28 ENCOUNTER — Encounter (HOSPITAL_COMMUNITY): Payer: Self-pay | Admitting: Vascular Surgery

## 2020-11-28 ENCOUNTER — Other Ambulatory Visit: Payer: Self-pay

## 2020-11-28 NOTE — Patient Instructions (Signed)
Visit Information  Ms. Eckels was given information about Medicaid Managed Care team care coordination services as a part of their [place] Medicaid benefit. [name] verbally consented to engagement with the [facility] Managed Care team.   If you are experiencing a medical emergency, please call 911 or report to your local emergency department or urgent care.   If you have a non-emergency medical problem during routine business hours, please contact your provider's office and ask to speak with a nurse.   For questions related to your [facility], please call: [phone] or visit the homepage here: [email or web address]  If you would like to schedule transportation through your [facility], please call the following number at least 2 days in advance of your appointment: [phone].   Call the [place] at [phone], at any time, 24 hours a day, 7 days a week. If you are in danger or need immediate medical attention call 911.  If you would like help to quit smoking, call 1-800-QUIT-NOW 3857871213) OR Espaol: 1-855-Djelo-Ya (1-025-852-7782) o para ms informacin haga clic aqu or Text READY to 200-400 to register via text  Ms. [name] - following are the goals we discussed in your visit today:   Goals Addressed             This Visit's Progress    Monitor and Manage My Blood Sugar-Diabetes Type 2       Timeframe:  Long-Range Goal Priority:  Medium Start Date:  08/09/20                           Expected End Date:  12/06/20                     Follow Up Date  12/06/20   - obtain appropriate test strips and check your blood sugar before breakfast and at bedtime daily - attend appointment on 9/20 with Endocrinology  - adhere to a diabetic diet, eat three meals a day, including protein with each meal - check blood sugar if I  feel it is too high or too low - call your provider with abnormal readings - enter blood sugar readings and medication or insulin into daily log - take the blood sugar log to all doctor visits - take the blood sugar meter to all doctor visits  - increase exercise to 30 min a day/5 days a week   Why is this important?   Checking your blood sugar at home helps to keep it from getting very high or very low.  Writing the results in a diary or log helps the doctor know how to care for you.  Your blood sugar log should have the time, date and the results.  Also, write down the amount of insulin or other medicine that you take.  Other information, like what you ate, exercise done and how you were feeling, will also be helpful.          Track and Manage Fluids and Swelling-Heart Failure       Timeframe:  Long-Range Goal Priority:  High Start Date:    08/09/20                         Expected End Date:    12/06/20  Follow Up Date 12/06/20   - weigh at the same time each day - call office if I gain more than 2 pounds in one day or 5 pounds in one week - use salt in moderation - watch for swelling in feet, ankles and legs every day - call provider with any concerns or questions     Why is this important?   It is important to check your weight daily and watch how much salt and liquids you have.  It will help you to manage your heart failure.         Track and Manage Symptoms-Heart Failure       Timeframe:  Long-Range Goal Priority:  High Start Date:   08/09/20                          Expected End Date:    12/06/20                   Follow Up Date 12/06/20    - work on cutting out smoking completely - replace chips with a healthy snack like nuts - eat more whole grains, fruits and vegetables, lean meats and healthy fats - know when to call the doctor - track symptoms and what helps feel better or worse - dress right for the weather, hot or cold    Why is this  important?   You will be able to handle your symptoms better if you keep track of them.  Making some simple changes to your lifestyle will help.  Eating healthy is one thing you can do to take good care of yourself.            Please see education materials related to CHF and diabetes provided as print materials.   The patient verbalized understanding of instructions provided today and agreed to receive a mailed copy of patient instruction and/or educational materials.  Telephone follow up appointment with Managed Medicaid care management team member scheduled for:  [name] RN, BSN [place] RN Care Coordinator   Following is a copy of your plan of care:  Patient Care Plan: Heart Failure (Adult)     Problem Identified: Symptom Exacerbation (Heart Failure)      Long-Range Goal: Symptom Exacerbation Prevented or Minimized   Start Date: 08/09/2020  Expected End Date: 12/06/2020  Recent Progress: On track  Priority: High  Note:   Current Barriers:  Knowledge deficit related to basic heart failure pathophysiology and self care management-Ms. Kirt is managing her health with the assistance of an Aide. Today patient was alert, but confused at times and requested RNCM to speak with her Aide. Ms. Carthen was not at home and was unable to review her medications. She has recently began receiving Paramedic support for medication management. [name] is unsure of medications. She is taking her "bubble packs" 3 times a day and is part of the Paramedicine program. [name] comes out every Friday and checks BP and BS. Ms. Platt continues to feeling "sleepy" after taking her medications.  Ms. Sago did receive a scale, but is not weighing daily. She is trying to get some exercise by walking up and down the road, but reports back pain afterward. Ms. Mahlum is aware of local food pantries and plans to go later today. -Update-Ms. Harries reports improvement in the way  she feels during the day since taking toprol at night instead of during the day.  She has cut back on smoking to 1/2 a cigarette after meals.  Knowledge Deficits related to heart failure medications Patient does not have readable scale Literacy Barriers Financial strain Cognitive Deficits Unable to self administer medications as prescribed Does not adhere to provider recommendations re:  Does not contact provider office for questions/concerns Case Manager Clinical Goal(s):  patient will weigh self daily and record patient will verbalize understanding of Heart Failure Action Plan and when to call doctor patient will take all Heart Failure mediations as prescribed patient will weigh daily and record (notifying MD of 3 lb weight gain over night or 5 lb in a week) Interventions:   Reviewed upcoming appointments: 9/20 with Endocrinology Provided encouragement Basic overview and discussion of pathophysiology of Heart Failure reviewed  Provided written information on diabetic and dash diet Discussed importance of daily weight and advised patient to weigh and record daily, call provider with weight gain of 3# in one day or 5# in a week Provided therapeutic listening Advised patient to wear a supportive shoe while exercising-do not wear flip flops Patient Goals/Self-Care Activities - work on cutting out smoking completely - attend scheduled appointments - replace chips with a healthy snack like nuts - eat more whole grains, fruits and vegetables, lean meats and healthy fats - know when to call the doctor - track symptoms and what helps feel better or worse - dress right for the weather, hot or cold  - call office if I gain more than 2 pounds in one day or 5 pounds in one week - use salt in moderation - watch for swelling in feet, ankles and legs every day - weigh myself daily  Follow Up Plan: Telephone follow up appointment with care management team member scheduled for:12/06/20 @ 11:15am      Patient Care Plan: Diabetes Type 2 (Adult)     Problem Identified: Glycemic Management (Diabetes, Type 2)      Long-Range Goal: Glycemic Management Optimized   Start Date: 08/09/2020  Expected End Date: 12/06/2020  Recent Progress: On track  Priority: Medium  Note:   Objective:  Lab Results  Component Value Date   HGBA1C 8.5 (A) 09/17/2020   HGBA1C 8.5 09/17/2020   HGBA1C 8.5 (A) 09/17/2020   HGBA1C 8.5 (A) 09/17/2020   Lab Results  Component Value Date   CREATININE 4.30 (H) 11/26/2020   CREATININE 2.81 (H) 06/10/2020   CREATININE 2.53 (H) 06/09/2020   No results found for: EGFR Current Barriers:  Knowledge Deficits related to basic Diabetes pathophysiology and self care/management-RNCM reviewed note in patients chart regarding elevated blood sugars and PCP would like for patient to be seen this week. Patient reports checking her BS daily, yesterday reading was 202. Patient with decreased appetite and unclear about what she is eating. Patient was evaluated and referral to Endocrinology was placed. Ms. Garciagarcia has started attending a bible study on Monday and Thursday evenings.-Update-Ms. Eberwein has not been checking her BS, due to not having test strips. She had surgery for fistula creation on 9/6 and reports doing fine. She continues to have Paramedic, [name] come out and assist with medication management. Knowledge Deficits related to medications used for management of diabetes Difficulty obtaining or cannot afford medications Does not have glucometer to monitor blood sugar Financial Constraints Cognitive Deficits Unable to self administer medications as prescribed Does not contact provider office for questions/concerns Case Manager Clinical Goal(s):  patient will demonstrate improved adherence to prescribed treatment plan for diabetes self care/management as evidenced by: daily  monitoring and recording of CBG,  adherence to ADA/ carb modified diet, adherence to prescribed  medication regimen, contacting provider for new or worsened symptoms or questions.   Interventions:  Inter-disciplinary care team collaboration (see longitudinal plan of care) Provided education to patient about basic DM disease process Discussed plans with patient for ongoing care management follow up and provided patient with direct contact information for care management team Provided patient with written educational materials related to diabetic nutrition Reviewed upcoming appointments: 9/20-Endocrinology-Patient reports having transportation to this appointment Review of patient status, including review of consultants reports, relevant laboratory and other test results, and medications completed. Discussed the importance of eating 3 small meals a day, including protein and vegetables Will follow closely with frequent telephone visits Collaborated with Pharmacy, [place] for test strips-Patient to take her current glucometer to the pharmacy today to obtain correct test strips Self-Care Activities - Self administers oral medications as prescribed Attends all scheduled provider appointments Checks blood sugars as prescribed and utilize hyper and hypoglycemia protocol as needed Adheres to prescribed ADA/carb modified Patient Goals: - obtain appropriate test strips and check your blood sugar before breakfast and at bedtime daily - attend appointment on 9/20 with Endocrinology - adhere to a diabetic diet, eat three meals a day, including protein with each meal - check blood sugar at prescribed times - check blood sugar if I feel it is too high or too low - call your provider with abnormal readings - enter blood sugar readings and medication or insulin into daily log - take the blood sugar log to all doctor visits - take the blood sugar meter to all doctor visits  - increase exercise to 30 min a day/5 days a week Follow Up Plan: Telephone follow up appointment with care management  team member scheduled for:12/06/20 @ 11:15am     Patient Care Plan: Medication management     Problem Identified: Health Promotion or Disease Self-Management (General Plan of Care)      Goal: Medication Management   Note:   Current Barriers:  Unable to self administer medications as prescribed Does not adhere to prescribed medication regimen Does not maintain contact with provider office Does not contact provider office for questions/concerns   Pharmacist Clinical Goal(s):  Over the next 30 days, patient will contact provider office for questions/concerns as evidenced notation of same in electronic health record through collaboration with PharmD and provider.    Interventions: Inter-disciplinary care team collaboration (see longitudinal plan of care) Comprehensive medication review performed; medication list updated in electronic medical record    Patient Goals/Self-Care Activities Over the next 30 days, patient will:  - collaborate with provider on medication access solutions  Follow Up Plan: The care management team will reach out to the patient again over the next 30 days.

## 2020-11-28 NOTE — Patient Outreach (Signed)
Medicaid Managed Care   Nurse Care Manager Note  11/28/2020 Name:  Latoya Walsh MRN:  [id number] DOB:  Apr 01, 1960  Latoya Walsh is an 60 y.o. year old female who is a primary patient of Latoya Helper, MD.  The [facility] Managed Care Coordination team was consulted for assistance with:    CHF DMII  Latoya Walsh was given information about Medicaid Managed Care Coordination team services today. Latoya Walsh Patient agreed to services and verbal consent obtained.  Engaged with patient by telephone for follow up visit in response to provider referral for case management and/or care coordination services.   Assessments/Interventions:  Review of past medical history, allergies, medications, health status, including review of consultants reports, laboratory and other test data, was performed as part of comprehensive evaluation and provision of chronic care management services.  SDOH (Social Determinants of Health) assessments and interventions performed:   Care Plan  Allergies  Allergen Reactions   Penicillins Itching and Rash    Medications Reviewed Today     Reviewed by Latoya Montane, RN (Registered Nurse) on 11/28/20 at 32  Med List Status: <None>   Medication Order Taking? Sig Documenting Provider Last Dose Status Informant  amLODipine (NORVASC) 5 MG tablet [id number] No TAKE ONE TABLET BY MOUTH ONCE DAILY. Latoya Helper, MD 11/26/2020 Active   blood glucose meter kit and supplies [id number] No Dispense based on patient and insurance preference. Once daily testing dx E11.9. Latoya Romp, NP Unknown Active   calcitRIOL (ROCALTROL) 0.25 MCG capsule [id number] No Take 0.25 mcg by mouth daily. [provider] Unknown Active   Cholecalciferol (VITAMIN D3) 25 MCG (1000 UT) CAPS [id number] No Take by mouth. [provider] 11/26/2020 Active   furosemide (LASIX) 80 MG tablet [id number] No Take 80 mg  (1 tablet) in the morning and 40 mg (half tablet) in  the afternoon. Latoya Lenis, MD Unknown Active   gabapentin (NEURONTIN) 100 MG capsule [id number] No Take 1 capsule (100 mg total) by mouth 3 (three) times daily. Latoya Larsson, NP Unknown Active   glipiZIDE (GLUCOTROL XL) 5 MG 24 hr tablet [id number] No Take 5 mg by mouth daily with breakfast. [provider] 11/26/2020 Active   hydrALAZINE (APRESOLINE) 50 MG tablet [id number] No TAKE ONE TABLET BY MOUTH EVERY 8 HOURS Latoya Spar, MD 11/26/2020 Active   insulin glargine (LANTUS SOLOSTAR) 100 UNIT/ML Solostar Pen [id number] No Inject 10 Units into the skin at bedtime. Latoya Romp, NP Unknown Active   Insulin Pen Needle (PEN NEEDLES) 31G X 5 MM MISC [id number] No Use to inject insulin once daily Latoya Romp, NP Unknown Active   isosorbide mononitrate (IMDUR) 30 MG 24 hr tablet [id number] No Take 1 tablet (30 mg total) by mouth daily. Latoya Lenis, MD Taking Expired 11/20/20 2359   metoprolol succinate (TOPROL-XL) 50 MG 24 hr tablet [id number] No Take 1 tablet (50 mg total) by mouth daily. Take with or immediately following a meal. Latoya Lenis, MD Taking Expired 11/20/20 2359   mirtazapine (REMERON) 15 MG tablet [id number] No TAKE (1) TABLET BY MOUTH AT BEDTIME. Latoya Helper, MD Unknown Active   oxyCODONE-acetaminophen (PERCOCET) 5-325 MG tablet [id number]  Take 1 tablet by mouth every 6 (six) hours as needed for severe pain. Latoya Posner, MD  Active   oxyCODONE-acetaminophen (PERCOCET) 5-325 MG tablet [id number]  Take 1 tablet by mouth every 6 (six) hours as needed for severe pain. Latoya Posner, MD  Active   rosuvastatin (CRESTOR) 10 MG tablet [id number] No Take 1 tablet (10 mg total) by mouth daily. Latoya Lenis, MD 11/26/2020 Active   sodium bicarbonate 650 MG tablet [id number] No Take 650 mg by mouth 2 (two) times daily. [provider] 11/26/2020 Active   Med List Note Latoya Walsh, [place] 78/29/56 2130):              Patient Active  Problem List   Diagnosis Date Noted   Acute CHF (congestive heart failure) ([place]) 06/06/2020   Leg swelling 04/28/2020   Hyperlipidemia LDL goal <100 04/28/2020   Left hand pain 03/10/2020   Anemia 02/19/2020   Depression, major, single episode, in partial remission ([place]) 01/25/2019   Headache 11/13/2018   Insomnia 09/25/2018   Screening for colorectal cancer 07/23/2016   Lumbar back pain with radiculopathy affecting right lower extremity 03/27/2015   Non compliance with medical treatment 07/22/2014   Chronic hepatitis C without hepatic coma ([place]) 12/04/2013   Allergic rhinitis 11/03/2012   Severe hypertension 06/25/2011   Anxiety and depression 02/05/2011   Nicotine dependence 11/01/2010   Drug dependence, continuous abuse ([place]) 03/22/2010   Uncontrolled type 2 diabetes mellitus with hyperglycemia ([place]) 06/02/2007   Alcohol abuse 06/02/2007    Conditions to be addressed/monitored per PCP order:  CHF and DMII  Care Plan : Heart Failure (Adult)  Updates made by Latoya Montane, RN since 11/28/2020 12:00 AM     Problem: Symptom Exacerbation (Heart Failure)      Long-Range Goal: Symptom Exacerbation Prevented or Minimized   Start Date: 08/09/2020  Expected End Date: 12/06/2020  Recent Progress: On track  Priority: High  Note:   Current Barriers:  Knowledge deficit related to basic heart failure pathophysiology and self care management-Latoya Walsh is managing her health with the assistance of an Aide. Today patient was alert, but confused at times and requested RNCM to speak with her Aide. Latoya Walsh was not at home and was unable to review her medications. She has recently began receiving Paramedic support for medication management. Latoya Walsh is unsure of medications. She is taking her "bubble packs" 3 times a day and is part of the Paramedicine program. Latoya Walsh comes out every Friday and checks BP and BS. Latoya Walsh continues to feeling "sleepy" after taking her medications.  Latoya Walsh  did receive a scale, but is not weighing daily. She is trying to get some exercise by walking up and down the road, but reports back pain afterward. Latoya Walsh is aware of local food pantries and plans to go later today. -Update-Ms. Correia reports improvement in the way she feels during the day since taking toprol at night instead of during the day. She has cut back on smoking to 1/2 a cigarette after meals.  Knowledge Deficits related to heart failure medications Patient does not have readable scale Literacy Barriers Financial strain Cognitive Deficits Unable to self administer medications as prescribed Does not adhere to provider recommendations re:  Does not contact provider office for questions/concerns Case Manager Clinical Goal(s):  patient will weigh self daily and record patient will verbalize understanding of Heart Failure Action Plan and when to call doctor patient will take all Heart Failure mediations as prescribed patient will weigh daily and record (notifying MD of 3 lb weight gain over night or 5 lb in a week) Interventions:   Reviewed upcoming appointments: 9/20 with Endocrinology Provided encouragement Basic overview and discussion of pathophysiology of Heart Failure reviewed  Provided written  information on diabetic and dash diet Discussed importance of daily weight and advised patient to weigh and record daily, call provider with weight gain of 3# in one day or 5# in a week Provided therapeutic listening Advised patient to wear a supportive shoe while exercising-do not wear flip flops Patient Goals/Self-Care Activities - work on cutting out smoking completely - attend scheduled appointments - replace chips with a healthy snack like nuts - eat more whole grains, fruits and vegetables, lean meats and healthy fats - know when to call the doctor - track symptoms and what helps feel better or worse - dress right for the weather, hot or cold  - call office if I gain more  than 2 pounds in one day or 5 pounds in one week - use salt in moderation - watch for swelling in feet, ankles and legs every day - weigh myself daily  Follow Up Plan: Telephone follow up appointment with care management team member scheduled for:12/06/20 @ 11:15am     Care Plan : Diabetes Type 2 (Adult)  Updates made by Dorice Montane, RN since 11/28/2020 12:00 AM     Problem: Glycemic Management (Diabetes, Type 2)      Long-Range Goal: Glycemic Management Optimized   Start Date: 08/09/2020  Expected End Date: 12/06/2020  Recent Progress: On track  Priority: Medium  Note:   Objective:  Lab Results  Component Value Date   HGBA1C 8.5 (A) 09/17/2020   HGBA1C 8.5 09/17/2020   HGBA1C 8.5 (A) 09/17/2020   HGBA1C 8.5 (A) 09/17/2020   Lab Results  Component Value Date   CREATININE 4.30 (H) 11/26/2020   CREATININE 2.81 (H) 06/10/2020   CREATININE 2.53 (H) 06/09/2020   No results found for: EGFR Current Barriers:  Knowledge Deficits related to basic Diabetes pathophysiology and self care/management-RNCM reviewed note in patients chart regarding elevated blood sugars and PCP would like for patient to be seen this week. Patient reports checking her BS daily, yesterday reading was 202. Patient with decreased appetite and unclear about what she is eating. Patient was evaluated and referral to Endocrinology was placed. Ms. Ciaramitaro has started attending a bible study on Monday and Thursday evenings.-Update-Ms. Lovecchio has not been checking her BS, due to not having test strips. She had surgery for fistula creation on 9/6 and reports doing fine. She continues to have Paramedic, Latoya Walsh come out and assist with medication management. Knowledge Deficits related to medications used for management of diabetes Difficulty obtaining or cannot afford medications Does not have glucometer to monitor blood sugar Financial Constraints Cognitive Deficits Unable to self administer medications as prescribed Does  not contact provider office for questions/concerns Case Manager Clinical Goal(s):  patient will demonstrate improved adherence to prescribed treatment plan for diabetes self care/management as evidenced by: daily monitoring and recording of CBG,  adherence to ADA/ carb modified diet, adherence to prescribed medication regimen, contacting provider for new or worsened symptoms or questions.   Interventions:  Inter-disciplinary care team collaboration (see longitudinal plan of care) Provided education to patient about basic DM disease process Discussed plans with patient for ongoing care management follow up and provided patient with direct contact information for care management team Provided patient with written educational materials related to diabetic nutrition Reviewed upcoming appointments: 9/20-Endocrinology-Patient reports having transportation to this appointment Review of patient status, including review of consultants reports, relevant laboratory and other test results, and medications completed. Discussed the importance of eating 3 small meals a day, including protein and vegetables Will  follow closely with frequent telephone visits Collaborated with Pharmacy, [facility] for test strips-Patient to take her current glucometer to the pharmacy today to obtain correct test strips Self-Care Activities - Self administers oral medications as prescribed Attends all scheduled provider appointments Checks blood sugars as prescribed and utilize hyper and hypoglycemia protocol as needed Adheres to prescribed ADA/carb modified Patient Goals: - obtain appropriate test strips and check your blood sugar before breakfast and at bedtime daily - attend appointment on 9/20 with Endocrinology - adhere to a diabetic diet, eat three meals a day, including protein with each meal - check blood sugar at prescribed times - check blood sugar if I feel it is too high or too low - call your provider with  abnormal readings - enter blood sugar readings and medication or insulin into daily log - take the blood sugar log to all doctor visits - take the blood sugar meter to all doctor visits  - increase exercise to 30 min a day/5 days a week Follow Up Plan: Telephone follow up appointment with care management team member scheduled for:12/06/20 @ 11:15am      Follow Up:  Patient agrees to Care Plan and Follow-up.  Plan: The Managed Medicaid care management team will reach out to the patient again over the next 7 days.  Date/time of next scheduled RN care management/care coordination outreach:  12/06/20 @ 11:15am  [name] RN, [place] RN Care Coordinator

## 2020-11-29 ENCOUNTER — Telehealth: Payer: Self-pay | Admitting: Pharmacist

## 2020-11-29 ENCOUNTER — Other Ambulatory Visit: Payer: Self-pay

## 2020-11-29 ENCOUNTER — Other Ambulatory Visit: Payer: Self-pay | Admitting: Nurse Practitioner

## 2020-11-29 DIAGNOSIS — E1165 Type 2 diabetes mellitus with hyperglycemia: Secondary | ICD-10-CM

## 2020-11-29 MED ORDER — BLOOD GLUCOSE METER KIT: PACK | 0 refills | Status: DC

## 2020-11-29 NOTE — Patient Outreach (Signed)
11/29/2020 Name: Latoya Walsh MRN: [id number] DOB: 10-Dec-1960  Referred by: [name], MD Reason for referral : No chief complaint on file.  Was able to reach [name] for her scheduled call but she was not able to speak. Patient requested call back to reschedule appointment.  Follow Up Plan: The Managed Medicaid care management team will reach out to the patient again over the next 10 days.   [name] PharmD, CPP High Risk Managed Medicaid [place] [phone]

## 2020-12-04 DIAGNOSIS — D638 Anemia in other chronic diseases classified elsewhere: Secondary | ICD-10-CM | POA: Diagnosis not present

## 2020-12-04 DIAGNOSIS — E211 Secondary hyperparathyroidism, not elsewhere classified: Secondary | ICD-10-CM | POA: Diagnosis not present

## 2020-12-04 DIAGNOSIS — N041 Nephrotic syndrome with focal and segmental glomerular lesions: Secondary | ICD-10-CM | POA: Diagnosis not present

## 2020-12-04 DIAGNOSIS — E1122 Type 2 diabetes mellitus with diabetic chronic kidney disease: Secondary | ICD-10-CM | POA: Diagnosis not present

## 2020-12-04 DIAGNOSIS — N170 Acute kidney failure with tubular necrosis: Secondary | ICD-10-CM | POA: Diagnosis not present

## 2020-12-04 DIAGNOSIS — N185 Chronic kidney disease, stage 5: Secondary | ICD-10-CM | POA: Diagnosis not present

## 2020-12-04 DIAGNOSIS — E559 Vitamin D deficiency, unspecified: Secondary | ICD-10-CM | POA: Diagnosis not present

## 2020-12-04 DIAGNOSIS — R808 Other proteinuria: Secondary | ICD-10-CM | POA: Diagnosis not present

## 2020-12-04 DIAGNOSIS — I129 Hypertensive chronic kidney disease with stage 1 through stage 4 chronic kidney disease, or unspecified chronic kidney disease: Secondary | ICD-10-CM | POA: Diagnosis not present

## 2020-12-06 ENCOUNTER — Other Ambulatory Visit: Payer: Self-pay

## 2020-12-06 ENCOUNTER — Other Ambulatory Visit: Payer: Self-pay | Admitting: *Deleted

## 2020-12-06 NOTE — Patient Instructions (Signed)
Visit Information  Latoya Walsh was given information about Medicaid Managed Care team care coordination services as a part of their [place] Medicaid benefit. Latoya Walsh verbally consented to engagement with the [facility] Managed Care team.   If you are experiencing a medical emergency, please call 911 or report to your local emergency department or urgent care.   If you have a non-emergency medical problem during routine business hours, please contact your provider's office and ask to speak with a nurse.   For questions related to your [facility], please call: [phone] or visit the homepage here: [email or web address]  If you would like to schedule transportation through your [facility], please call the following number at least 2 days in advance of your appointment: [phone].   Call the [place] at [phone], at any time, 24 hours a day, 7 days a week. If you are in danger or need immediate medical attention call 911.  If you would like help to quit smoking, call 1-800-QUIT-NOW (669)534-6568) OR Espaol: 1-855-Djelo-Ya (4-403-474-2595) o para ms informacin haga clic aqu or Text READY to 200-400 to register via text  [name] - following are the goals we discussed in your visit today:   Goals Addressed             This Visit's Progress    Monitor and Manage My Blood Sugar-Diabetes Type 2       Timeframe:  Long-Range Goal Priority:  Medium Start Date:  08/09/20                           Expected End Date:  ongoing                     Follow Up Date  12/19/20   - take your current glucometer to the Pharmacy to obtain accurate test strips - obtain appropriate test strips and check your blood sugar before breakfast and at bedtime daily - attend appointment on 9/20 with Endocrinology  - adhere to a diabetic diet, eat  three meals a day, including protein with each meal - check blood sugar if I feel it is too high or too low - call your provider with abnormal readings - enter blood sugar readings and medication or insulin into daily log - take the blood sugar log to all doctor visits - take the blood sugar meter to all doctor visits  - increase exercise to 30 min a day/5 days a week   Why is this important?   Checking your blood sugar at home helps to keep it from getting very high or very low.  Writing the results in a diary or log helps the doctor know how to care for you.  Your blood sugar log should have the time, date and the results.  Also, write down the amount of insulin or other medicine that you take.  Other information, like what you ate, exercise done and how you were feeling, will also be helpful.          Track and Manage Fluids and Swelling-Heart Failure       Timeframe:  Long-Range Goal Priority:  High Start Date:    08/09/20                         Expected End Date:    ongoing  Follow Up Date 12/19/20   - weigh at the same time each day - call office if I gain more than 2 pounds in one day or 5 pounds in one week - use salt in moderation - watch for swelling in feet, ankles and legs every day - call provider with any concerns or questions     Why is this important?   It is important to check your weight daily and watch how much salt and liquids you have.  It will help you to manage your heart failure.         Track and Manage Symptoms-Heart Failure       Timeframe:  Long-Range Goal Priority:  High Start Date:   08/09/20                          Expected End Date:    ongoing                  Follow Up Date 12/19/20    - work on cutting out smoking completely - replace chips with a healthy snack like nuts - eat more whole grains, fruits and vegetables, lean meats and healthy fats - know when to call the doctor - track symptoms and what helps feel better  or worse - dress right for the weather, hot or cold    Why is this important?   You will be able to handle your symptoms better if you keep track of them.  Making some simple changes to your lifestyle will help.  Eating healthy is one thing you can do to take good care of yourself.            Please see education materials related to smoking cessation and diabetes provided as print materials.   The patient verbalized understanding of instructions provided today and agreed to receive a mailed copy of patient instruction and/or educational materials.  Telephone follow up appointment with Managed Medicaid care management team member scheduled for:12/19/20 @ [place] RN, [place] RN Care Coordinator   Following is a copy of your plan of care:  Patient Care Plan: Heart Failure (Adult)     Problem Identified: Symptom Exacerbation (Heart Failure)      Long-Range Goal: Symptom Exacerbation Prevented or Minimized   Start Date: 08/09/2020  Expected End Date: 02/04/2021  Recent Progress: On track  Priority: High  Note:   Current Barriers:  Knowledge deficit related to basic heart failure pathophysiology and self care management-Ms. Janoski is managing her health with the assistance of an Aide. Today patient was alert, but confused at times and requested RNCM to speak with her Aide. Ms. Krizan was not at home and was unable to review her medications. She has recently began receiving Paramedic support for medication management. [name] is unsure of medications. She is taking her "bubble packs" 3 times a day and is part of the Paramedicine program. [name] comes out every Friday and checks BP and BS. Ms. Zenker continues to feeling "sleepy" after taking her medications.  Ms. Geving did receive a scale, but is not weighing daily. She is trying to get some exercise by walking up and down the road, but reports back pain afterward. Ms. Dier is aware of  local food pantries and plans to go later today. -Update-Ms. Choyce reports improvement in the way she feels during the day since taking toprol at night instead of during  the day. She has cut back on smoking to 1/2 a cigarette after meals.  Knowledge Deficits related to heart failure medications Patient does not have readable scale Literacy Barriers Financial strain Cognitive Deficits Unable to self administer medications as prescribed Does not adhere to provider recommendations re:  Does not contact provider office for questions/concerns Case Manager Clinical Goal(s):  patient will weigh self daily and record patient will verbalize understanding of Heart Failure Action Plan and when to call doctor patient will take all Heart Failure mediations as prescribed patient will weigh daily and record (notifying MD of 3 lb weight gain over night or 5 lb in a week) Interventions:   Reviewed upcoming appointments: 9/20 with Endocrinology Provided encouragement Basic overview and discussion of pathophysiology of Heart Failure reviewed  Provided written information on diabetic and dash diet Discussed importance of daily weight and advised patient to weigh and record daily, call provider with weight gain of 3# in one day or 5# in a week Provided therapeutic listening Advised patient to wear a supportive shoe while exercising-do not wear flip flops Patient Goals/Self-Care Activities - work on cutting out smoking completely - attend scheduled appointments - replace chips with a healthy snack like nuts - eat more whole grains, fruits and vegetables, lean meats and healthy fats - know when to call the doctor - track symptoms and what helps feel better or worse - dress right for the weather, hot or cold  - call office if I gain more than 2 pounds in one day or 5 pounds in one week - use salt in moderation - watch for swelling in feet, ankles and legs every day - weigh myself daily  Follow Up Plan:  Telephone follow up appointment with care management team member scheduled for:12/19/20 @ 1pm     Patient Care Plan: Diabetes Type 2 (Adult)     Problem Identified: Glycemic Management (Diabetes, Type 2)      Long-Range Goal: Glycemic Management Optimized   Start Date: 08/09/2020  Expected End Date: 02/04/2021  Recent Progress: On track  Priority: Medium  Note:   Objective:  Lab Results  Component Value Date   HGBA1C 8.5 (A) 09/17/2020   HGBA1C 8.5 09/17/2020   HGBA1C 8.5 (A) 09/17/2020   HGBA1C 8.5 (A) 09/17/2020   Lab Results  Component Value Date   CREATININE 4.30 (H) 11/26/2020   CREATININE 2.81 (H) 06/10/2020   CREATININE 2.53 (H) 06/09/2020   No results found for: EGFR Current Barriers:  Knowledge Deficits related to basic Diabetes pathophysiology and self care/management-RNCM reviewed note in patients chart regarding elevated blood sugars and PCP would like for patient to be seen this week. Patient reports checking her BS daily, yesterday reading was 202. Patient with decreased appetite and unclear about what she is eating. Patient was evaluated and referral to Endocrinology was placed. Ms. Lopes has started attending a bible study on Monday and Thursday evenings. Ms. Giampietro has not been checking her BS, due to not having test strips. She had surgery for fistula creation on 9/6 and reports doing fine. She continues to have Paramedic, [name] come out and assist with medication management.-Update-Ms. Giebel continues to not have any test strips to monitor blood sugar. She is not feeling well this morning, reports taking all medications, but has not eaten. Patient with decreased appetite. Paramedic [name] is coming out today at 3pm. Patient aware of her appointment with Endocrinology on 9/20. Return call to patient, she has now eaten and is feeling better. Reports  that she can't get more test strips until next month-she has used the amount provided because she was checking more  frequently.  Knowledge Deficits related to medications used for management of diabetes Difficulty obtaining or cannot afford medications Does not have glucometer to monitor blood sugar Financial Constraints Cognitive Deficits Unable to self administer medications as prescribed Does not contact provider office for questions/concerns Case Manager Clinical Goal(s):  patient will demonstrate improved adherence to prescribed treatment plan for diabetes self care/management as evidenced by: daily monitoring and recording of CBG,  adherence to ADA/ carb modified diet, adherence to prescribed medication regimen, contacting provider for new or worsened symptoms or questions.   Interventions:  Inter-disciplinary care team collaboration (see longitudinal plan of care) Provided education to patient about basic DM disease process Discussed plans with patient for ongoing care management follow up and provided patient with direct contact information for care management team Provided patient with written educational materials related to diabetic nutrition Reviewed upcoming appointments: 9/20-Endocrinology-Patient reports having transportation to this appointment Review of patient status, including review of consultants reports, relevant laboratory and other test results, and medications completed. Discussed the importance of eating 3 small meals a day, including protein and vegetables-Encouraged patient to eat a meal containing protein this morning, examples of appropriate food choices given Call made to [facility] [place] for collaboration on obtaining test strips-message left, waiting on return call Will follow closely with frequent telephone visits Explained to patient to take her current glucometer to the pharmacy for assistance with obtaining the appropriate test strips Advised patient to discuss needing test strips with [name] when she comes out today. Self-Care Activities - Self administers oral  medications as prescribed Attends all scheduled provider appointments Checks blood sugars as prescribed and utilize hyper and hypoglycemia protocol as needed Adheres to prescribed ADA/carb modified Patient Goals: - take your current glucometer to the Pharmacy to obtain accurate test strips - obtain appropriate test strips and check your blood sugar before breakfast and at bedtime daily - attend appointment on 9/20 with Endocrinology - adhere to a diabetic diet, eat three meals a day, including protein with each meal - check blood sugar at prescribed times - check blood sugar if I feel it is too high or too low - call your provider with abnormal readings - enter blood sugar readings and medication or insulin into daily log - take the blood sugar log to all doctor visits - take the blood sugar meter to all doctor visits  - increase exercise to 30 min a day/5 days a week Follow Up Plan: Telephone follow up appointment with care management team member scheduled for:12/19/20 @ 1pm     Patient Care Plan: Medication management     Problem Identified: Health Promotion or Disease Self-Management (General Plan of Care)      Goal: Medication Management   Note:   Current Barriers:  Unable to self administer medications as prescribed Does not adhere to prescribed medication regimen Does not maintain contact with provider office Does not contact provider office for questions/concerns   Pharmacist Clinical Goal(s):  Over the next 30 days, patient will contact provider office for questions/concerns as evidenced notation of same in electronic health record through collaboration with PharmD and provider.    Interventions: Inter-disciplinary care team collaboration (see longitudinal plan of care) Comprehensive medication review performed; medication list updated in electronic medical record    Patient Goals/Self-Care Activities Over the next 30 days, patient will:  - collaborate with  provider on medication  access solutions  Follow Up Plan: The care management team will reach out to the patient again over the next 30 days.

## 2020-12-06 NOTE — Patient Outreach (Signed)
Medicaid Managed Care   Nurse Care Manager Note  12/06/2020 Name:  Latoya Walsh MRN:  [id number] DOB:  [birth year]  Latoya Walsh is an 60 y.o. year old female who is a primary patient of Latoya Helper, MD.  The [facility] Managed Care Coordination team was consulted for assistance with:    CHF DMII  Latoya Walsh was given information about Medicaid Managed Care Coordination team services today. Latoya Walsh Patient agreed to services and verbal consent obtained.  Engaged with patient by telephone for follow up visit in response to provider referral for case management and/or care coordination services.   Assessments/Interventions:  Review of past medical history, allergies, medications, health status, including review of consultants reports, laboratory and other test data, was performed as part of comprehensive evaluation and provision of chronic care management services.  SDOH (Social Determinants of Health) assessments and interventions performed:   Care Plan  Allergies  Allergen Reactions   Penicillins Itching and Rash    Medications Reviewed Today     Reviewed by Arrayah Montane, RN (Registered Nurse) on 12/06/20 at 64  Med List Status: <None>   Medication Order Taking? Sig Documenting Provider Last Dose Status Informant  amLODipine (NORVASC) 5 MG tablet [id number] No TAKE ONE TABLET BY MOUTH ONCE DAILY. Latoya Helper, MD 11/26/2020 Active   blood glucose meter kit and supplies [id number]  Dispense based on patient and insurance preference. Use to monitor glucose twice daily. Latoya Romp, NP  Active   calcitRIOL (ROCALTROL) 0.25 MCG capsule [id number] No Take 0.25 mcg by mouth daily. [provider] Unknown Active   Cholecalciferol (VITAMIN D3) 25 MCG (1000 UT) CAPS [id number] No Take by mouth. [provider] 11/26/2020 Active   furosemide (LASIX) 80 MG tablet [id number] No Take 80 mg  (1 tablet) in the morning and 40 mg (half tablet) in  the afternoon. Latoya Lenis, MD Unknown Active   gabapentin (NEURONTIN) 100 MG capsule [id number] No Take 1 capsule (100 mg total) by mouth 3 (three) times daily. Latoya Larsson, NP Unknown Active   glipiZIDE (GLUCOTROL XL) 5 MG 24 hr tablet [id number] No Take 5 mg by mouth daily with breakfast. [provider] 11/26/2020 Active   hydrALAZINE (APRESOLINE) 50 MG tablet [id number] No TAKE ONE TABLET BY MOUTH EVERY 8 HOURS [name], MD 11/26/2020 Active   insulin glargine (LANTUS SOLOSTAR) 100 UNIT/ML Solostar Pen [id number] No Inject 10 Units into the skin at bedtime. Latoya Romp, NP Unknown Active   Insulin Pen Needle (PEN NEEDLES) 31G X 5 MM MISC [id number] No Use to inject insulin once daily Latoya Romp, NP Unknown Active   isosorbide mononitrate (IMDUR) 30 MG 24 hr tablet [id number] No Take 1 tablet (30 mg total) by mouth daily. Latoya Lenis, MD Taking Expired 11/20/20 2359   metoprolol succinate (TOPROL-XL) 50 MG 24 hr tablet [id number] No Take 1 tablet (50 mg total) by mouth daily. Take with or immediately following a meal. Latoya Lenis, MD Taking Expired 11/20/20 2359   mirtazapine (REMERON) 15 MG tablet [id number] No TAKE (1) TABLET BY MOUTH AT BEDTIME. Latoya Helper, MD Unknown Active   oxyCODONE-acetaminophen (PERCOCET) 5-325 MG tablet [id number]  Take 1 tablet by mouth every 6 (six) hours as needed for severe pain. Latoya Posner, MD  Active   oxyCODONE-acetaminophen (PERCOCET) 5-325 MG tablet [id number]  Take 1 tablet by mouth every 6 (six) hours as needed for severe pain. Latoya Posner,  MD  Active   rosuvastatin (CRESTOR) 10 MG tablet [id number] No Take 1 tablet (10 mg total) by mouth daily. Latoya Lenis, MD 11/26/2020 Active   sodium bicarbonate 650 MG tablet [id number] No Take 650 mg by mouth 2 (two) times daily. [provider] 11/26/2020 Active   Med List Note Latoya Walsh, [place] 84/53/64 6803):              Patient Active  Problem List   Diagnosis Date Noted   Acute CHF (congestive heart failure) ([place]) 06/06/2020   Leg swelling 04/28/2020   Hyperlipidemia LDL goal <100 04/28/2020   Left hand pain 03/10/2020   Anemia 02/19/2020   Depression, major, single episode, in partial remission ([place]) 01/25/2019   Headache 11/13/2018   Insomnia 09/25/2018   Screening for colorectal cancer 07/23/2016   Lumbar back pain with radiculopathy affecting right lower extremity 03/27/2015   Non compliance with medical treatment 07/22/2014   Chronic hepatitis C without hepatic coma ([place]) 12/04/2013   Allergic rhinitis 11/03/2012   Severe hypertension 06/25/2011   Anxiety and depression 02/05/2011   Nicotine dependence 11/01/2010   Drug dependence, continuous abuse ([place]) 03/22/2010   Uncontrolled type 2 diabetes mellitus with hyperglycemia ([place]) 06/02/2007   Alcohol abuse 06/02/2007    Conditions to be addressed/monitored per PCP order:  CHF and DMII  Care Plan : Heart Failure (Adult)  Updates made by Gerica Montane, RN since 12/06/2020 12:00 AM     Problem: Symptom Exacerbation (Heart Failure)      Long-Range Goal: Symptom Exacerbation Prevented or Minimized   Start Date: 08/09/2020  Expected End Date: 02/04/2021  Recent Progress: On track  Priority: High  Note:   Current Barriers:  Knowledge deficit related to basic heart failure pathophysiology and self care management-Latoya Walsh is managing her health with the assistance of an Aide. Today patient was alert, but confused at times and requested RNCM to speak with her Aide. Ms. Ottaway was not at home and was unable to review her medications. She has recently began receiving Paramedic support for medication management. [name] is unsure of medications. She is taking her "bubble packs" 3 times a day and is part of the Paramedicine program. [name] comes out every Friday and checks BP and BS. Ms. Quigg continues to feeling "sleepy" after taking her medications.  Ms. Josten  did receive a scale, but is not weighing daily. She is trying to get some exercise by walking up and down the road, but reports back pain afterward. Ms. Lewey is aware of local food pantries and plans to go later today. -Update-Ms. Donlon reports improvement in the way she feels during the day since taking toprol at night instead of during the day. She has cut back on smoking to 1/2 a cigarette after meals.  Knowledge Deficits related to heart failure medications Patient does not have readable scale Literacy Barriers Financial strain Cognitive Deficits Unable to self administer medications as prescribed Does not adhere to provider recommendations re:  Does not contact provider office for questions/concerns Case Manager Clinical Goal(s):  patient will weigh self daily and record patient will verbalize understanding of Heart Failure Action Plan and when to call doctor patient will take all Heart Failure mediations as prescribed patient will weigh daily and record (notifying MD of 3 lb weight gain over night or 5 lb in a week) Interventions:   Reviewed upcoming appointments: 9/20 with Endocrinology Provided encouragement Basic overview and discussion of pathophysiology of Heart Failure reviewed  Provided written information on diabetic and dash diet Discussed importance of daily weight and advised patient to weigh and record daily, call provider with weight gain of 3# in one day or 5# in a week Provided therapeutic listening Advised patient to wear a supportive shoe while exercising-do not wear flip flops Patient Goals/Self-Care Activities - work on cutting out smoking completely - attend scheduled appointments - replace chips with a healthy snack like nuts - eat more whole grains, fruits and vegetables, lean meats and healthy fats - know when to call the doctor - track symptoms and what helps feel better or worse - dress right for the weather, hot or cold  - call office if I gain more  than 2 pounds in one day or 5 pounds in one week - use salt in moderation - watch for swelling in feet, ankles and legs every day - weigh myself daily  Follow Up Plan: Telephone follow up appointment with care management team member scheduled for:12/19/20 @ 1pm     Care Plan : Diabetes Type 2 (Adult)  Updates made by Cecil Montane, RN since 12/06/2020 12:00 AM     Problem: Glycemic Management (Diabetes, Type 2)      Long-Range Goal: Glycemic Management Optimized   Start Date: 08/09/2020  Expected End Date: 02/04/2021  Recent Progress: On track  Priority: Medium  Note:   Objective:  Lab Results  Component Value Date   HGBA1C 8.5 (A) 09/17/2020   HGBA1C 8.5 09/17/2020   HGBA1C 8.5 (A) 09/17/2020   HGBA1C 8.5 (A) 09/17/2020   Lab Results  Component Value Date   CREATININE 4.30 (H) 11/26/2020   CREATININE 2.81 (H) 06/10/2020   CREATININE 2.53 (H) 06/09/2020   No results found for: EGFR Current Barriers:  Knowledge Deficits related to basic Diabetes pathophysiology and self care/management-RNCM reviewed note in patients chart regarding elevated blood sugars and PCP would like for patient to be seen this week. Patient reports checking her BS daily, yesterday reading was 202. Patient with decreased appetite and unclear about what she is eating. Patient was evaluated and referral to Endocrinology was placed. Ms. Mano has started attending a bible study on Monday and Thursday evenings. Ms. Marton has not been checking her BS, due to not having test strips. She had surgery for fistula creation on 9/6 and reports doing fine. She continues to have Paramedic, [name] come out and assist with medication management.-Update-Ms. Steptoe continues to not have any test strips to monitor blood sugar. She is not feeling well this morning, reports taking all medications, but has not eaten. Patient with decreased appetite. Paramedic [name] is coming out today at 3pm. Patient aware of her appointment with  Endocrinology on 9/20. Return call to patient, she has now eaten and is feeling better. Reports that she can't get more test strips until next month-she has used the amount provided because she was checking more frequently.  Knowledge Deficits related to medications used for management of diabetes Difficulty obtaining or cannot afford medications Does not have glucometer to monitor blood sugar Financial Constraints Cognitive Deficits Unable to self administer medications as prescribed Does not contact provider office for questions/concerns Case Manager Clinical Goal(s):  patient will demonstrate improved adherence to prescribed treatment plan for diabetes self care/management as evidenced by: daily monitoring and recording of CBG,  adherence to ADA/ carb modified diet, adherence to prescribed medication regimen, contacting provider for new or worsened symptoms or questions.   Interventions:  Inter-disciplinary care team collaboration (see longitudinal  plan of care) Provided education to patient about basic DM disease process Discussed plans with patient for ongoing care management follow up and provided patient with direct contact information for care management team Provided patient with written educational materials related to diabetic nutrition Reviewed upcoming appointments: 9/20-Endocrinology-Patient reports having transportation to this appointment Review of patient status, including review of consultants reports, relevant laboratory and other test results, and medications completed. Discussed the importance of eating 3 small meals a day, including protein and vegetables-Encouraged patient to eat a meal containing protein this morning, examples of appropriate food choices given Call made to [facility] for collaboration on obtaining test strips-message left, waiting on return call Will follow closely with frequent telephone visits Explained to patient to take her current glucometer to  the pharmacy for assistance with obtaining the appropriate test strips Advised patient to discuss needing test strips with [name] when she comes out today. Self-Care Activities - Self administers oral medications as prescribed Attends all scheduled provider appointments Checks blood sugars as prescribed and utilize hyper and hypoglycemia protocol as needed Adheres to prescribed ADA/carb modified Patient Goals: - take your current glucometer to the Pharmacy to obtain accurate test strips - obtain appropriate test strips and check your blood sugar before breakfast and at bedtime daily - attend appointment on 9/20 with Endocrinology - adhere to a diabetic diet, eat three meals a day, including protein with each meal - check blood sugar at prescribed times - check blood sugar if I feel it is too high or too low - call your provider with abnormal readings - enter blood sugar readings and medication or insulin into daily log - take the blood sugar log to all doctor visits - take the blood sugar meter to all doctor visits  - increase exercise to 30 min a day/5 days a week Follow Up Plan: Telephone follow up appointment with care management team member scheduled for:12/19/20 @ 1pm      Follow Up:  Patient agrees to Care Plan and Follow-up.  Plan: The Managed Medicaid care management team will reach out to the patient again over the next 15 days.  Date/time of next scheduled RN care management/care coordination outreach:  12/19/20 @ 1pm  [name] RN, [place] RN Care Coordinator

## 2020-12-09 NOTE — Patient Instructions (Signed)
Diabetes Mellitus and Nutrition, Adult When you have diabetes, or diabetes mellitus, it is very important to have healthy eating habits because your blood sugar (glucose) levels are greatly affected by what you eat and drink. Eating healthy foods in the right amounts, at about the same times every day, can help you:  Control your blood glucose.  Lower your risk of heart disease.  Improve your blood pressure.  Reach or maintain a healthy weight. What can affect my meal plan? Every person with diabetes is different, and each person has different needs for a meal plan. Your health care provider may recommend that you work with a dietitian to make a meal plan that is best for you. Your meal plan may vary depending on factors such as:  The calories you need.  The medicines you take.  Your weight.  Your blood glucose, blood pressure, and cholesterol levels.  Your activity level.  Other health conditions you have, such as heart or kidney disease. How do carbohydrates affect me? Carbohydrates, also called carbs, affect your blood glucose level more than any other type of food. Eating carbs naturally raises the amount of glucose in your blood. Carb counting is a method for keeping track of how many carbs you eat. Counting carbs is important to keep your blood glucose at a healthy level, especially if you use insulin or take certain oral diabetes medicines. It is important to know how many carbs you can safely have in each meal. This is different for every person. Your dietitian can help you calculate how many carbs you should have at each meal and for each snack. How does alcohol affect me? Alcohol can cause a sudden decrease in blood glucose (hypoglycemia), especially if you use insulin or take certain oral diabetes medicines. Hypoglycemia can be a life-threatening condition. Symptoms of hypoglycemia, such as sleepiness, dizziness, and confusion, are similar to symptoms of having too much  alcohol.  Do not drink alcohol if: ? Your health care provider tells you not to drink. ? You are pregnant, may be pregnant, or are planning to become pregnant.  If you drink alcohol: ? Do not drink on an empty stomach. ? Limit how much you use to:  0-1 drink a day for women.  0-2 drinks a day for men. ? Be aware of how much alcohol is in your drink. In the [place] one drink equals one 12 oz bottle of beer (355 mL), one 5 oz glass of wine (148 mL), or one 1 oz glass of hard liquor (44 mL). ? Keep yourself hydrated with water, diet soda, or unsweetened iced tea.  Keep in mind that regular soda, juice, and other mixers may contain a lot of sugar and must be counted as carbs. What are tips for following this plan? Reading food labels  Start by checking the serving size on the "Nutrition Facts" label of packaged foods and drinks. The amount of calories, carbs, fats, and other nutrients listed on the label is based on one serving of the item. Many items contain more than one serving per package.  Check the total grams (g) of carbs in one serving. You can calculate the number of servings of carbs in one serving by dividing the total carbs by 15. For example, if a food has 30 g of total carbs per serving, it would be equal to 2 servings of carbs.  Check the number of grams (g) of saturated fats and trans fats in one serving. Choose foods that have   a low amount or none of these fats.  Check the number of milligrams (mg) of salt (sodium) in one serving. Most people should limit total sodium intake to less than 2,300 mg per day.  Always check the nutrition information of foods labeled as "low-fat" or "nonfat." These foods may be higher in added sugar or refined carbs and should be avoided.  Talk to your dietitian to identify your daily goals for nutrients listed on the label. Shopping  Avoid buying canned, pre-made, or processed foods. These foods tend to be high in fat, sodium, and added  sugar.  Shop around the outside edge of the grocery store. This is where you will most often find fresh fruits and vegetables, bulk grains, fresh meats, and fresh dairy. Cooking  Use low-heat cooking methods, such as baking, instead of high-heat cooking methods like deep frying.  Cook using healthy oils, such as olive, canola, or sunflower oil.  Avoid cooking with butter, cream, or high-fat meats. Meal planning  Eat meals and snacks regularly, preferably at the same times every day. Avoid going long periods of time without eating.  Eat foods that are high in fiber, such as fresh fruits, vegetables, beans, and whole grains. Talk with your dietitian about how many servings of carbs you can eat at each meal.  Eat 4-6 oz (112-168 g) of lean protein each day, such as lean meat, chicken, fish, eggs, or tofu. One ounce (oz) of lean protein is equal to: ? 1 oz (28 g) of meat, chicken, or fish. ? 1 egg. ?  cup (62 g) of tofu.  Eat some foods each day that contain healthy fats, such as avocado, nuts, seeds, and fish.   What foods should I eat? Fruits Berries. Apples. Oranges. Peaches. Apricots. Plums. Grapes. Mango. Papaya. Pomegranate. Kiwi. Cherries. Vegetables Lettuce. Spinach. Leafy greens, including kale, chard, collard greens, and mustard greens. Beets. Cauliflower. Cabbage. Broccoli. Carrots. Green beans. Tomatoes. Peppers. Onions. Cucumbers. Brussels sprouts. Grains Whole grains, such as whole-wheat or whole-grain bread, crackers, tortillas, cereal, and pasta. Unsweetened oatmeal. Quinoa. Brown or wild rice. Meats and other proteins Seafood. Poultry without skin. Lean cuts of poultry and beef. Tofu. Nuts. Seeds. Dairy Low-fat or fat-free dairy products such as milk, yogurt, and cheese. The items listed above may not be a complete list of foods and beverages you can eat. Contact a dietitian for more information. What foods should I avoid? Fruits Fruits canned with  syrup. Vegetables Canned vegetables. Frozen vegetables with butter or cream sauce. Grains Refined white flour and flour products such as bread, pasta, snack foods, and cereals. Avoid all processed foods. Meats and other proteins Fatty cuts of meat. Poultry with skin. Breaded or fried meats. Processed meat. Avoid saturated fats. Dairy Full-fat yogurt, cheese, or milk. Beverages Sweetened drinks, such as soda or iced tea. The items listed above may not be a complete list of foods and beverages you should avoid. Contact a dietitian for more information. Questions to ask a health care provider  Do I need to meet with a diabetes educator?  Do I need to meet with a dietitian?  What number can I call if I have questions?  When are the best times to check my blood glucose? Where to find more information:  American Diabetes Association: diabetes.org  Academy of Nutrition and Dietetics: www.eatright.org  National Institute of Diabetes and Digestive and Kidney Diseases: www.niddk.nih.gov  Association of Diabetes Care and Education Specialists: www.diabeteseducator.org Summary  It is important to have healthy eating   habits because your blood sugar (glucose) levels are greatly affected by what you eat and drink.  A healthy meal plan will help you control your blood glucose and maintain a healthy lifestyle.  Your health care provider may recommend that you work with a dietitian to make a meal plan that is best for you.  Keep in mind that carbohydrates (carbs) and alcohol have immediate effects on your blood glucose levels. It is important to count carbs and to use alcohol carefully. This information is not intended to replace advice given to you by your health care provider. Make sure you discuss any questions you have with your health care provider. Document Revised: 02/14/2019 Document Reviewed: 02/14/2019 Elsevier Patient Education  2021 Elsevier Inc.  

## 2020-12-10 ENCOUNTER — Ambulatory Visit (INDEPENDENT_AMBULATORY_CARE_PROVIDER_SITE_OTHER): Payer: Medicaid Other | Admitting: Nurse Practitioner

## 2020-12-10 ENCOUNTER — Encounter: Payer: Self-pay | Admitting: Nurse Practitioner

## 2020-12-10 ENCOUNTER — Other Ambulatory Visit: Payer: Self-pay | Admitting: Internal Medicine

## 2020-12-10 ENCOUNTER — Other Ambulatory Visit: Payer: Self-pay

## 2020-12-10 VITALS — BP 151/65 | HR 70 | Ht 67.0 in | Wt 176.4 lb

## 2020-12-10 DIAGNOSIS — E782 Mixed hyperlipidemia: Secondary | ICD-10-CM

## 2020-12-10 DIAGNOSIS — I10 Essential (primary) hypertension: Secondary | ICD-10-CM

## 2020-12-10 DIAGNOSIS — E1165 Type 2 diabetes mellitus with hyperglycemia: Secondary | ICD-10-CM

## 2020-12-10 DIAGNOSIS — E1140 Type 2 diabetes mellitus with diabetic neuropathy, unspecified: Secondary | ICD-10-CM

## 2020-12-10 NOTE — Progress Notes (Signed)
Endocrinology Follow Up Note       12/10/2020, 10:34 AM   Subjective:    Patient ID: Latoya Walsh, female    DOB: [birth year].  Latoya Walsh is being seen in follow up after being seen in consultation for management of currently uncontrolled symptomatic diabetes requested by  [name], MD.   Past Medical History:  Diagnosis Date   Allergic rhinitis    Anxiety    CHF (congestive heart failure) ([place])    a. EF 30-35% by echo in 05/2020   Chronic back pain    Chronic hepatitis C without hepatic coma ([place])    COVID-19 virus infection 12/26/2019   Depression    Diabetes mellitus    Hepatitis C    Hypertension    Noncompliance    Poor appetite 07/17/2014   Substance abuse ([place])    HX of drug use and alcohol use    Past Surgical History:  Procedure Laterality Date   APPENDECTOMY  2004   AV FISTULA PLACEMENT Left 11/26/2020   Procedure: LEFT ARM ARTERIOVENOUS (AV) FISTULA CREATION;  Surgeon: [name], MD;  Location: [facility];  Service: Vascular;  Laterality: Left;    Social History   Socioeconomic History   Marital status: Single    Spouse name: Not on file   Number of children: 3   Years of education: Not on file   Highest education level: Not on file  Occupational History   Occupation: Disabled  Tobacco Use   Smoking status: Light Smoker    Packs/day: 0.25    Types: Cigarettes   Smokeless tobacco: Never  Vaping Use   Vaping Use: Never used  Substance and Sexual Activity   Alcohol use: Not Currently    Alcohol/week: 40.0 standard drinks    Types: 40 Cans of beer per week    Comment: 2 16oz cans of beer a day   Drug use: Not Currently    Types: Marijuana, Cocaine    Comment: Last smoked marijuana yesterday 10/18/14   Sexual activity: Yes    Birth control/protection: Condom  Other Topics Concern   Not on file  Social History Narrative   Not on file   Social Determinants  of Health   Financial Resource Strain: Low Risk    Difficulty of Paying Living Expenses: Not hard at all  Food Insecurity: Food Insecurity Present   Worried About Charity fundraiser in the Last Year: Sometimes true   Arboriculturist in the Last Year: Sometimes true  Transportation Needs: No Transportation Needs   Lack of Transportation (Medical): No   Lack of Transportation (Non-Medical): No  Physical Activity: Not on file  Stress: Not on file  Social Connections: Not on file    Family History  Problem Relation Age of Onset   Diabetes Sister        Twin - AIDS     Outpatient Encounter Medications as of 12/10/2020  Medication Sig   ACCU-CHEK GUIDE test strip    Accu-Chek Softclix Lancets lancets 2 (two) times daily.   amLODipine (NORVASC) 5 MG tablet TAKE ONE TABLET BY MOUTH ONCE DAILY.   blood glucose meter kit  and supplies Dispense based on patient and insurance preference. Use to monitor glucose twice daily.   calcitRIOL (ROCALTROL) 0.25 MCG capsule Take 0.25 mcg by mouth daily.   Cholecalciferol (VITAMIN D3) 25 MCG (1000 UT) CAPS Take by mouth.   furosemide (LASIX) 80 MG tablet Take 80 mg  (1 tablet) in the morning and 40 mg (half tablet) in the afternoon.   gabapentin (NEURONTIN) 100 MG capsule Take 1 capsule (100 mg total) by mouth 3 (three) times daily.   glipiZIDE (GLUCOTROL XL) 5 MG 24 hr tablet Take 5 mg by mouth daily with breakfast.   hydrALAZINE (APRESOLINE) 50 MG tablet TAKE ONE TABLET BY MOUTH EVERY 8 HOURS   insulin glargine (LANTUS SOLOSTAR) 100 UNIT/ML Solostar Pen Inject 10 Units into the skin at bedtime.   Insulin Pen Needle (PEN NEEDLES) 31G X 5 MM MISC Use to inject insulin once daily   mirtazapine (REMERON) 15 MG tablet TAKE (1) TABLET BY MOUTH AT BEDTIME.   oxyCODONE-acetaminophen (PERCOCET) 5-325 MG tablet Take 1 tablet by mouth every 6 (six) hours as needed for severe pain.   oxyCODONE-acetaminophen (PERCOCET) 5-325 MG tablet Take 1 tablet by mouth every  6 (six) hours as needed for severe pain.   rosuvastatin (CRESTOR) 10 MG tablet Take 1 tablet (10 mg total) by mouth daily.   sodium bicarbonate 650 MG tablet Take 650 mg by mouth 2 (two) times daily.   isosorbide mononitrate (IMDUR) 30 MG 24 hr tablet Take 1 tablet (30 mg total) by mouth daily.   metoprolol succinate (TOPROL-XL) 50 MG 24 hr tablet Take 1 tablet (50 mg total) by mouth daily. Take with or immediately following a meal.   No facility-administered encounter medications on file as of 12/10/2020.    ALLERGIES: Allergies  Allergen Reactions   Penicillins Itching and Rash    VACCINATION STATUS: Immunization History  Administered Date(s) Administered   H1N1 04/04/2008   Influenza Split 02/05/2011, 12/02/2011   Influenza Whole 12/10/2006, 01/08/2009   Influenza,inj,Quad PF,6+ Mos 02/12/2014, 03/27/2015, 03/05/2020   PFIZER(Purple Top)SARS-COV-2 Vaccination 09/29/2019   Pneumococcal Conjugate-13 06/11/2014   Pneumococcal Polysaccharide-23 12/05/2004, 10/30/2010   Td 08/09/2003    Diabetes She presents for her follow-up diabetic visit. She has type 2 diabetes mellitus. Onset time: Diagnosed at approx age of 57. Her disease course has been improving. Hypoglycemia symptoms include hunger, sweats and tremors. Pertinent negatives for hypoglycemia include no nervousness/anxiousness. Associated symptoms include blurred vision and fatigue. Pertinent negatives for diabetes include no polyuria and no weight loss. There are no hypoglycemic complications. Symptoms are stable. Diabetic complications include heart disease and nephropathy. Risk factors for coronary artery disease include diabetes mellitus, dyslipidemia, hypertension, sedentary lifestyle and tobacco exposure. Current diabetic treatment includes oral agent (monotherapy) and insulin injections. She is compliant with treatment most of the time. Her weight is fluctuating minimally. She is following a generally unhealthy diet. When asked  about meal planning, she reported none. She has not had a previous visit with a dietitian. She rarely participates in exercise. Her home blood glucose trend is decreasing steadily. Her breakfast blood glucose range is generally 140-180 mg/dl. Her overall blood glucose range is 140-180 mg/dl. (She presents today with her meter and logs showing inconsistent glucose monitoring pattern with improving fasting glucose readings.  She was not yet due for another A1c today.  Analysis of her meter shows 7-day average of 166 with 5 readings; 14-day average of 166 with 5 readings; 30-day average of 182 with 10 readings; and 90-day average  of 197 with 23 readings.  She did report a drop in glucose yesterday in which she ate a piece of cake to bring it back up.  When asked if she checked her glucose at that time, she could not recall what it was (nor was the reading in her meter).  She has new HD access in left wrist, has follow up appt with nephrologist next week to start HD.) An ACE inhibitor/angiotensin II receptor blocker is being taken. She does not see a podiatrist.Eye exam is not current.  Hypertension This is a chronic problem. The current episode started more than 1 year ago. The problem has been waxing and waning since onset. The problem is uncontrolled. Associated symptoms include blurred vision and sweats. There are no associated agents to hypertension. Risk factors for coronary artery disease include diabetes mellitus, dyslipidemia, family history, sedentary lifestyle and smoking/tobacco exposure. Past treatments include calcium channel blockers, diuretics, angiotensin blockers, beta blockers and direct vasodilators. The current treatment provides moderate improvement. Compliance problems include diet, exercise and psychosocial issues.  Hypertensive end-organ damage includes kidney disease and heart failure. Identifiable causes of hypertension include chronic renal disease.  Hyperlipidemia This is a chronic  problem. The current episode started more than 1 year ago. The problem is uncontrolled. Recent lipid tests were reviewed and are variable. Exacerbating diseases include chronic renal disease and diabetes. Factors aggravating her hyperlipidemia include beta blockers, fatty foods and smoking. Current antihyperlipidemic treatment includes statins. The current treatment provides mild improvement of lipids. Compliance problems include adherence to diet, adherence to exercise and psychosocial issues.  Risk factors for coronary artery disease include diabetes mellitus, dyslipidemia, family history, hypertension and a sedentary lifestyle.    Review of systems  Constitutional: + stable body weight, current Body mass index is 27.63 kg/m., + fatigue, no subjective hyperthermia, no subjective hypothermia Eyes: + blurry vision, no xerophthalmia ENT: no sore throat, no nodules palpated in throat, no dysphagia/odynophagia, no hoarseness Cardiovascular: no chest pain, no shortness of breath, no palpitations, + leg swelling Respiratory: no cough, no shortness of breath Gastrointestinal: no nausea/vomiting/diarrhea Musculoskeletal: no muscle/joint aches Skin: no rashes, no hyperemia Neurological: no tremors, no numbness, no tingling, no dizziness Psychiatric: no depression, no anxiety  Objective:     BP (!) 151/65   Pulse 70   Ht 5' 7"  (1.702 m)   Wt 176 lb 6.4 oz (80 kg)   LMP 06/23/2010   BMI 27.63 kg/m   Wt Readings from Last 3 Encounters:  12/10/20 176 lb 6.4 oz (80 kg)  11/26/20 179 lb 6.4 oz (81.4 kg)  11/20/20 179 lb 6.4 oz (81.4 kg)     BP Readings from Last 3 Encounters:  12/10/20 (!) 151/65  11/26/20 (!) 145/77  11/20/20 (!) 158/76      Physical Exam- Limited  Constitutional:  Body mass index is 27.63 kg/m. , not in acute distress, reluctant state of mind, mildly slurred speech today Eyes:  EOMI, no exophthalmos Neck: Supple Cardiovascular: RRR, no murmurs, rubs, or gallops, no  edema Respiratory: Adequate breathing efforts, no crackles, rales, rhonchi, or wheezing Musculoskeletal: no gross deformities, strength intact in all four extremities, no gross restriction of joint movements Skin:  no rashes, no hyperemia Neurological: no tremor with outstretched hands   CMP ( most recent) CMP     Component Value Date/Time   NA 143 11/26/2020 1054   NA 144 05/02/2020 1535   K 4.3 11/26/2020 1054   CL 111 11/26/2020 1054   CO2 24 06/10/2020  0501   GLUCOSE 174 (H) 11/26/2020 1054   BUN 41 (H) 11/26/2020 1054   BUN 38 (H) 05/02/2020 1535   CREATININE 4.30 (H) 11/26/2020 1054   CREATININE 1.49 (H) 03/28/2019 1056   CALCIUM 8.2 (L) 06/10/2020 0501   PROT 6.1 (L) 06/06/2020 1447   PROT 6.2 05/02/2020 1535   ALBUMIN 2.7 (L) 06/06/2020 1447   ALBUMIN 3.3 (L) 05/02/2020 1535   AST 26 06/06/2020 1447   ALT 20 06/06/2020 1447   ALT 65 (H) 12/04/2013 1449   ALKPHOS 87 06/06/2020 1447   BILITOT 0.5 06/06/2020 1447   BILITOT <0.2 05/02/2020 1535   BILITOT 0.3 12/04/2013 1449   GFRNONAA 19 (L) 06/10/2020 0501   GFRNONAA 39 (L) 03/28/2019 1056   GFRAA 31 (L) 05/02/2020 1535   GFRAA 45 (L) 03/28/2019 1056     Diabetic Labs (most recent): Lab Results  Component Value Date   HGBA1C 8.5 (A) 09/17/2020   HGBA1C 8.5 09/17/2020   HGBA1C 8.5 (A) 09/17/2020   HGBA1C 8.5 (A) 09/17/2020     Lipid Panel ( most recent) Lipid Panel     Component Value Date/Time   CHOL 167 05/02/2020 1535   TRIG 277 (H) 05/02/2020 1535   HDL 34 (L) 05/02/2020 1535   CHOLHDL 4.9 (H) 05/02/2020 1535   CHOLHDL 4.8 03/28/2019 1056   VLDL 14 10/02/2016 0821   LDLCALC 87 05/02/2020 1535   LDLCALC 136 (H) 03/28/2019 1056   LABVLDL 46 (H) 05/02/2020 1535      Lab Results  Component Value Date   TSH 1.673 06/07/2020   TSH 2.160 01/05/2020   TSH 2.32 09/27/2018   TSH 1.99 10/02/2016   TSH 1.238 04/22/2015   TSH 1.419 02/19/2014   TSH 2.567 07/11/2012   TSH 2.052 10/30/2010   TSH  1.170 09/14/2008   TSH 1.172 09/16/2007           Assessment & Plan:   1) Uncontrolled type 2 diabetes mellitus with hyperglycemia ([place])  She presents today with her meter and logs showing inconsistent glucose monitoring pattern with improving fasting glucose readings.  She was not yet due for another A1c today.  Analysis of her meter shows 7-day average of 166 with 5 readings; 14-day average of 166 with 5 readings; 30-day average of 182 with 10 readings; and 90-day average of 197 with 23 readings.  She did report a drop in glucose yesterday in which she ate a piece of cake to bring it back up.  When asked if she checked her glucose at that time, she could not recall what it was (nor was the reading in her meter).  She has new HD access in left wrist, has follow up appt with nephrologist next week to start HD.  - [name] has currently uncontrolled symptomatic type 2 DM since 60 years of age, with most recent A1c of 8.5 %.   -Recent labs reviewed.  - I had a long discussion with her about the progressive nature of diabetes and the pathology behind its complications. -her diabetes is complicated by noncompliance, CHF, CKD stage 4, ETOH abuse and she remains at a high risk for more acute and chronic complications which include CAD, CVA, CKD, retinopathy, and neuropathy. These are all discussed in detail with her.  - Nutritional counseling repeated at each appointment due to patients tendency to fall back in to old habits.  - The patient admits there is a room for improvement in their diet and drink choices. -  Suggestion is made for the patient to avoid simple carbohydrates from their diet including Cakes, Sweet Desserts / Pastries, Ice Cream, Soda (diet and regular), Sweet Tea, Candies, Chips, Cookies, Sweet Pastries, Store Bought Juices, Alcohol in Excess of 1-2 drinks a day, Artificial Sweeteners, Coffee Creamer, and "Sugar-free" Products. This will help patient to have stable blood  glucose profile and potentially avoid unintended weight gain.   - I encouraged the patient to switch to unprocessed or minimally processed complex starch and increased protein intake (animal or plant source), fruits, and vegetables.   - Patient is advised to stick to a routine mealtimes to eat 3 meals a day and avoid unnecessary snacks (to snack only to correct hypoglycemia).  - I have approached her with the following individualized plan to manage her diabetes and patient agrees:   -For safety purposes, no changes will be made to her medication regimen today.  She is advised to continue Lantus 10 units SQ nightly and Glipizide 5 mg XL daily with breakfast (she is advised to bring her medications with her to her next visit to verify she is taking her medications properly).   -she is encouraged to continue CONSISTENTLY monitoring blood glucose twice daily, before breakfast and before bed, and to call the clinic if she has readings less than 70 or above 300 for 3 tests in a row.   - she is warned not to take insulin without proper monitoring per orders. - Adjustment parameters are given to her for hypo and hyperglycemia in writing.  - she is not a candidate for Metformin or SGLT2i due to concurrent renal insufficiency.  - she is not a good candidate for incretin therapy due to body habitus.  - Specific targets for  A1c; LDL, HDL, and Triglycerides were discussed with the patient.  2) Blood Pressure /Hypertension:  her blood pressure is not controlled to target.   she is advised to continue her current medications including Norvasc 5 mg p.o. daily with breakfast, Lasix 40 mg po daily, Hydralazine 50 mg po TID, and Losartan 25 mg po daily.  Will defer med changes to nephrology.  3) Lipids/Hyperlipidemia:    Review of her recent lipid panel from 05/02/20 showed controlled LDL at 87 with elevated triglycerides of 277 .  she is advised to continue Crestor 10 mg daily at bedtime.  Side effects and  precautions discussed with her.  4)  Weight/Diet:  her Body mass index is 27.63 kg/m.  -    she is not a candidate for major weight loss.  Exercise, and detailed carbohydrates information provided  -  detailed on discharge instructions.  5) Chronic Care/Health Maintenance: -she is on ACEI/ARB and Statin medications and is encouraged to initiate and continue to follow up with Ophthalmology, Dentist, Podiatrist at least yearly or according to recommendations, and advised to [place] [facility]. I have recommended yearly flu vaccine and pneumonia vaccine at least every 5 years; moderate intensity exercise for up to 150 minutes weekly; and sleep for at least 7 hours a day.  - she is advised to maintain close follow up with [name], MD for primary care needs, as well as her other providers for optimal and coordinated care.       I spent 31 minutes in the care of the patient today including review of labs from [facility], Lipids, Thyroid Function, Hematology (current and previous including abstractions from other facilities); face-to-face time discussing  her blood glucose readings/logs, discussing hypoglycemia and hyperglycemia episodes and symptoms,  medications doses, her options of short and long term treatment based on the latest standards of care / guidelines;  discussion about incorporating lifestyle medicine;  and documenting the encounter.    Please refer to Patient Instructions for Blood Glucose Monitoring and Insulin/Medications Dosing Guide"  in media tab for additional information. Please  also refer to " Patient Self Inventory" in the Media  tab for reviewed elements of pertinent patient history.  [name] participated in the discussions, expressed understanding, and voiced agreement with the above plans.  All questions were answered to her satisfaction. she is encouraged to contact clinic should she have any questions or concerns prior to her return visit.     Follow up  plan: - Return in about 3 months (around 03/11/2021) for Diabetes F/U with A1c in office, No previsit labs, Bring meter and logs.    Rayetta Pigg, [facility] [address] [place], [place] [address] Phone: [phone] Fax: [phone]  12/10/2020, 10:34 AM

## 2020-12-11 ENCOUNTER — Ambulatory Visit (HOSPITAL_COMMUNITY)
Admission: RE | Admit: 2020-12-11 | Discharge: 2020-12-11 | Disposition: A | Discharge status: Home / Self Care | Payer: Medicaid Other | Source: Ambulatory Visit | Attending: Nephrology | Admitting: Nephrology

## 2020-12-11 ENCOUNTER — Other Ambulatory Visit (HOSPITAL_COMMUNITY): Payer: Self-pay | Admitting: Nephrology

## 2020-12-11 DIAGNOSIS — I129 Hypertensive chronic kidney disease with stage 1 through stage 4 chronic kidney disease, or unspecified chronic kidney disease: Secondary | ICD-10-CM | POA: Insufficient documentation

## 2020-12-11 DIAGNOSIS — E559 Vitamin D deficiency, unspecified: Secondary | ICD-10-CM | POA: Diagnosis not present

## 2020-12-11 DIAGNOSIS — N185 Chronic kidney disease, stage 5: Secondary | ICD-10-CM | POA: Diagnosis not present

## 2020-12-11 DIAGNOSIS — R0602 Shortness of breath: Secondary | ICD-10-CM | POA: Diagnosis not present

## 2020-12-11 DIAGNOSIS — E1122 Type 2 diabetes mellitus with diabetic chronic kidney disease: Secondary | ICD-10-CM | POA: Diagnosis not present

## 2020-12-11 DIAGNOSIS — N170 Acute kidney failure with tubular necrosis: Secondary | ICD-10-CM | POA: Diagnosis not present

## 2020-12-11 DIAGNOSIS — N041 Nephrotic syndrome with focal and segmental glomerular lesions: Secondary | ICD-10-CM | POA: Diagnosis not present

## 2020-12-11 DIAGNOSIS — E211 Secondary hyperparathyroidism, not elsewhere classified: Secondary | ICD-10-CM | POA: Diagnosis not present

## 2020-12-11 DIAGNOSIS — D638 Anemia in other chronic diseases classified elsewhere: Secondary | ICD-10-CM | POA: Diagnosis not present

## 2020-12-11 DIAGNOSIS — I10 Essential (primary) hypertension: Secondary | ICD-10-CM | POA: Diagnosis not present

## 2020-12-11 DIAGNOSIS — R808 Other proteinuria: Secondary | ICD-10-CM | POA: Diagnosis not present

## 2020-12-11 DIAGNOSIS — J9811 Atelectasis: Secondary | ICD-10-CM | POA: Diagnosis not present

## 2020-12-11 DIAGNOSIS — J811 Chronic pulmonary edema: Secondary | ICD-10-CM | POA: Diagnosis not present

## 2020-12-13 ENCOUNTER — Other Ambulatory Visit: Payer: Self-pay

## 2020-12-16 NOTE — Patient Instructions (Signed)
Visit Information  [name] was given information about Medicaid Managed Care team care coordination services as a part of their [place] Medicaid benefit. Latoya Walsh verbally consented to engagement with the [facility] Managed Care team.   If you are experiencing a medical emergency, please call 911 or report to your local emergency department or urgent care.   If you have a non-emergency medical problem during routine business hours, please contact your provider's office and ask to speak with a nurse.   For questions related to your [facility], please call: [phone] or visit the homepage here: [email or web address]  If you would like to schedule transportation through your [facility], please call the following number at least 2 days in advance of your appointment: [phone].   Call the [facility] at [phone], at any time, 24 hours a day, 7 days a week. If you are in danger or need immediate medical attention call 911.  If you would like help to quit smoking, call 1-800-QUIT-NOW 386-506-4980) OR Espaol: 1-855-Djelo-Ya HD:1601594) o para ms informacin haga clic aqu or Text READY to 200-400 to register via text  [name] - following are the goals we discussed in your visit today:   Goals Addressed   None     Please see education materials related to dietary choices provided  below  The patient verbalized understanding of instructions provided today and declined a print copy of patient instruction materials.   RN Care Manager will 12/19/20 Pharmacist will 02/11/21  [name] PharmD, CPP High Risk Managed Medicaid [place] [phone]   Following is a copy of your plan of care:   Patient Care Plan: General Pharmacy (Adult)     Problem Identified: Chronic Disease Management   Priority: High      Long-Range Goal: Managing chronic disease therapies   Start Date: 12/13/2020  Expected End Date: 03/16/2021  This Visit's Progress: On track  Priority: High  Note:   Current Barriers:  Unable to achieve control of type 2 diabetes  Suboptimal therapeutic regimen for type 2 diabetes   Pharmacist Clinical Goal(s):  patient will achieve control of diabetes as evidenced by home blood glucose readings and A1C within goals maintain control of blood pressure as evidenced by continued office readings within goal  through collaboration with PharmD and provider.    Interventions: Inter-disciplinary care team collaboration (see longitudinal plan of care) Comprehensive medication review performed; medication list updated in electronic medical record  Diabetes:  Uncontrolled; current treatment: glipizide '5mg'$ , insulin glargine (Lantus) 10 units at bedtime;   Current glucose readings: fasting glucose: 169 this morning; reports sometimes 200's, post prandial glucose: 300's  Denies hypoglycemic/hyperglycemic symptoms  Current meal patterns:  Breakfast: skips breakfast and lunch Dinner: pizza Snacks: peanuts or fruit Dessert: jello (sugar free) Drinks: water, apple juice bottle 3x/day  Current exercise: denies  Educated on lifestyle modifcations Recommended patient follow-up with PCP to discuss further management of diabetes Collaborated with PCP to recommend therapy changes  Hypertension:  Controlled based on last office reading; current treatment: amlodipine '5mg'$ , furosemide '80mg'$  in the morning and 1/2 tablet in the afternoon, hydralazine '50mg'$ , isosorbide mononitrate '30mg'$ , metoprolol succinate '50mg'$ ;   Current home readings: patient does not have blood pressure monitor  Denies hypotensive/hypertensive symptoms  Recommended patient continue to take medications and discussed she may also check blood pressure at local pharmacies for free with machines  Patient Goals/Self-Care Activities Over  the next  90 days, patient will:  - take medications as prescribed check glucose 2x/day, document, and provide at future appointments target a minimum of 150 minutes of moderate intensity exercise weekly engage in dietary modifications by decreasing carbohydrate intake  Follow Up Plan: Telephone follow up appointment with care management team member scheduled for: Care Manager 12/19/20 PharmD 02/11/21

## 2020-12-16 NOTE — Patient Outreach (Signed)
Medicaid Managed Care Pharmacy Note  12/16/2020 Name:  Latoya Walsh MRN:  [id number] DOB:  Jun 02, 1960  Latoya Walsh is an 60 y.o. year old female who is a primary patient of [name], MD.  The [facility] Managed Care Coordination team was consulted for assistance with disease management and care coordination needs.    Engaged with patient by telephone for follow up visit in response to referral for case management and/or care coordination services.  [name] was given information about Medicaid Managed Care Coordination team services today. Latoya Walsh Patient agreed to services and verbal consent obtained.  Objective:  Lab Results  Component Value Date   CREATININE 4.30 (H) 11/26/2020   CREATININE 2.81 (H) 06/10/2020   CREATININE 2.53 (H) 06/09/2020    Lab Results  Component Value Date   HGBA1C 8.5 (A) 09/17/2020   HGBA1C 8.5 09/17/2020   HGBA1C 8.5 (A) 09/17/2020   HGBA1C 8.5 (A) 09/17/2020       Component Value Date/Time   CHOL 167 05/02/2020 1535   TRIG 277 (H) 05/02/2020 1535   HDL 34 (L) 05/02/2020 1535   CHOLHDL 4.9 (H) 05/02/2020 1535   CHOLHDL 4.8 03/28/2019 1056   VLDL 14 10/02/2016 0821   LDLCALC 87 05/02/2020 1535   LDLCALC 136 (H) 03/28/2019 1056    BP Readings from Last 3 Encounters:  12/10/20 (!) 151/65  11/26/20 (!) 145/77  11/20/20 (!) 158/76     Care Plan  Allergies  Allergen Reactions   Penicillins Itching and Rash    Medications Reviewed Today     Reviewed by [name], RPH-CPP (Pharmacist) on 12/13/20 at 16  Med List Status: <None>   Medication Order Taking? Sig Documenting Provider Last Dose Status Informant  ACCU-CHEK GUIDE test strip [id number] Yes  [provider] Taking Active   Accu-Chek Softclix Lancets lancets [id number] Yes 2 (two) times daily. [provider] Taking Active   amLODipine (NORVASC) 5 MG tablet [id number] Yes TAKE ONE TABLET BY MOUTH ONCE DAILY. [name], MD Taking Active   blood glucose meter kit and supplies [id number] Yes Dispense based on patient and insurance preference. Use to monitor glucose twice daily. [name], NP Taking Active   calcitRIOL (ROCALTROL) 0.25 MCG capsule [id number] Yes Take 0.25 mcg by mouth daily. [provider] Taking Active   Cholecalciferol (VITAMIN D3) 25 MCG (1000 UT) CAPS [id number] Yes Take by mouth. [provider] Taking Active   furosemide (LASIX) 80 MG tablet [id number] Yes Take 80 mg  (1 tablet) in the morning and 40 mg (half tablet) in the afternoon. [name], MD Taking Active   gabapentin (NEURONTIN) 100 MG capsule [id number] Yes Take 1 capsule (100 mg total) by mouth 3 (three) times daily. [name], NP Taking Active   glipiZIDE (GLUCOTROL XL) 5 MG 24 hr tablet [id number] Yes TAKE ONE TABLET BY MOUTH EVERY MORNING WITH BREAKFAST [name], MD Taking Active   hydrALAZINE (APRESOLINE) 50 MG tablet [id number] Yes TAKE ONE TABLET BY MOUTH EVERY 8 HOURS [name], MD Taking Active   insulin glargine (LANTUS SOLOSTAR) 100 UNIT/ML Solostar Pen [id number] Yes Inject 10 Units into the skin at bedtime. [name], NP Taking Active   Insulin Pen Needle (PEN NEEDLES) 31G X 5 MM MISC [id number]  Use to inject insulin once daily [name], NP  Active   isosorbide mononitrate (IMDUR) 30 MG 24 hr tablet [id number] Yes Take 1 tablet (30 mg  total) by mouth daily. [name], MD Taking Active   metoprolol succinate (TOPROL-XL) 50 MG 24 hr tablet [id number] Yes Take 1 tablet (50 mg total) by mouth daily. Take with or immediately following a meal. [name], MD Taking Active   mirtazapine (REMERON) 15 MG tablet [id number] Yes TAKE (1) TABLET BY MOUTH AT BEDTIME. [name], MD Taking Active   oxyCODONE-acetaminophen (PERCOCET) 5-325 MG tablet [id number]  Take 1 tablet by mouth every 6 (six) hours as needed for severe pain. [name], MD   Active   oxyCODONE-acetaminophen (PERCOCET) 5-325 MG tablet [id number] Yes Take 1 tablet by mouth every 6 (six) hours as needed for severe pain. [name], MD Taking Active   rosuvastatin (CRESTOR) 10 MG tablet [id number] Yes Take 1 tablet (10 mg total) by mouth daily. [name], MD Taking Active   sodium bicarbonate 650 MG tablet [id number] Yes Take 650 mg by mouth 2 (two) times daily. [provider] Taking Active   Med List Note Latoya Walsh, [place] 10/93/23 5573):              Patient Active Problem List   Diagnosis Date Noted   Acute CHF (congestive heart failure) ([place]) 06/06/2020   Leg swelling 04/28/2020   Hyperlipidemia LDL goal <100 04/28/2020   Left hand pain 03/10/2020   Anemia 02/19/2020   Depression, major, single episode, in partial remission ([place]) 01/25/2019   Headache 11/13/2018   Insomnia 09/25/2018   Screening for colorectal cancer 07/23/2016   Lumbar back pain with radiculopathy affecting right lower extremity 03/27/2015   Non compliance with medical treatment 07/22/2014   Chronic hepatitis C without hepatic coma ([place]) 12/04/2013   Allergic rhinitis 11/03/2012   Severe hypertension 06/25/2011   Anxiety and depression 02/05/2011   Nicotine dependence 11/01/2010   Drug dependence, continuous abuse ([place]) 03/22/2010   Uncontrolled type 2 diabetes mellitus with hyperglycemia ([place]) 06/02/2007   Alcohol abuse 06/02/2007    Conditions to be addressed/monitored per PCP order:  CHF, HTN, HLD, and DMII  Care Plan : General Pharmacy (Adult)  Updates made by [name], RPH-CPP since 12/16/2020 12:00 AM     Problem: Chronic Disease Management   Priority: High     Long-Range Goal: Managing chronic disease therapies   Start Date: 12/13/2020  Expected End Date: 03/16/2021  This Visit's Progress: On track  Priority: High  Note:   Current Barriers:  Unable to achieve control of type 2 diabetes  Suboptimal therapeutic regimen for type 2  diabetes   Pharmacist Clinical Goal(s):  patient will achieve control of diabetes as evidenced by home blood glucose readings and A1C within goals maintain control of blood pressure as evidenced by continued office readings within goal  through collaboration with PharmD and provider.    Interventions: Inter-disciplinary care team collaboration (see longitudinal plan of care) Comprehensive medication review performed; medication list updated in electronic medical record  Diabetes:  Uncontrolled; current treatment: glipizide 63m, insulin glargine (Lantus) 10 units at bedtime;   Current glucose readings: fasting glucose: 169 this morning; reports sometimes 200's, post prandial glucose: 300's  Denies hypoglycemic/hyperglycemic symptoms  Current meal patterns:  Breakfast: skips breakfast and lunch Dinner: pizza Snacks: peanuts or fruit Dessert: jello (sugar free) Drinks: water, apple juice bottle 3x/day  Current exercise: denies  Educated on lifestyle modifcations Recommended patient follow-up with PCP to discuss further management of diabetes Collaborated with PCP to recommend therapy changes  Hypertension:  Controlled based on last office  reading; current treatment: amlodipine 36m, furosemide 871min the morning and 1/2 tablet in the afternoon, hydralazine 5057misosorbide mononitrate 98m29metoprolol succinate 50mg51mCurrent home readings: patient does not have blood pressure monitor  Denies hypotensive/hypertensive symptoms  Recommended patient continue to take medications and discussed she may also check blood pressure at local pharmacies for free with machines  Patient Goals/Self-Care Activities Over the next 90 days, patient will:  - take medications as prescribed check glucose 2x/day, document, and provide at future appointments target a minimum of 150 minutes of moderate intensity exercise weekly engage in dietary modifications by decreasing carbohydrate intake  Follow Up  Plan: Telephone follow up appointment with care management team member scheduled for: Care Manager 12/19/20 PharmD 02/11/21        RacheHughes BettermD, CPP High Risk Managed MedicMadigan Army Medical Centerth [phone]

## 2020-12-18 ENCOUNTER — Other Ambulatory Visit (HOSPITAL_COMMUNITY): Payer: Self-pay | Admitting: Nephrology

## 2020-12-18 DIAGNOSIS — R808 Other proteinuria: Secondary | ICD-10-CM | POA: Diagnosis not present

## 2020-12-18 DIAGNOSIS — I5022 Chronic systolic (congestive) heart failure: Secondary | ICD-10-CM | POA: Diagnosis not present

## 2020-12-18 DIAGNOSIS — D638 Anemia in other chronic diseases classified elsewhere: Secondary | ICD-10-CM | POA: Diagnosis not present

## 2020-12-18 DIAGNOSIS — N185 Chronic kidney disease, stage 5: Secondary | ICD-10-CM

## 2020-12-18 DIAGNOSIS — R768 Other specified abnormal immunological findings in serum: Secondary | ICD-10-CM | POA: Diagnosis not present

## 2020-12-18 DIAGNOSIS — E559 Vitamin D deficiency, unspecified: Secondary | ICD-10-CM | POA: Diagnosis not present

## 2020-12-18 DIAGNOSIS — E790 Hyperuricemia without signs of inflammatory arthritis and tophaceous disease: Secondary | ICD-10-CM | POA: Diagnosis not present

## 2020-12-18 DIAGNOSIS — E211 Secondary hyperparathyroidism, not elsewhere classified: Secondary | ICD-10-CM | POA: Diagnosis not present

## 2020-12-18 DIAGNOSIS — I129 Hypertensive chronic kidney disease with stage 1 through stage 4 chronic kidney disease, or unspecified chronic kidney disease: Secondary | ICD-10-CM | POA: Diagnosis not present

## 2020-12-18 DIAGNOSIS — Z8619 Personal history of other infectious and parasitic diseases: Secondary | ICD-10-CM | POA: Diagnosis not present

## 2020-12-18 DIAGNOSIS — E1122 Type 2 diabetes mellitus with diabetic chronic kidney disease: Secondary | ICD-10-CM | POA: Diagnosis not present

## 2020-12-19 ENCOUNTER — Other Ambulatory Visit: Payer: Self-pay

## 2020-12-19 ENCOUNTER — Other Ambulatory Visit: Payer: Self-pay | Admitting: *Deleted

## 2020-12-19 NOTE — Patient Outreach (Signed)
Medicaid Managed Care   Nurse Care Manager Note  12/19/2020 Name:  Latoya Walsh MRN:  [id number] DOB:  1960/07/31  Latoya Walsh is an 60 y.o. year old female who is a primary patient of Latoya Helper, MD.  The [facility] Managed Care Coordination team was consulted for assistance with:    CHF DMII  Latoya Walsh was given information about Medicaid Managed Care Coordination team services today. Latoya Walsh Patient agreed to services and verbal consent obtained.  Engaged with patient by telephone for follow up visit in response to provider referral for case management and/or care coordination services.   Assessments/Interventions:  Review of past medical history, allergies, medications, health status, including review of consultants reports, laboratory and other test data, was performed as part of comprehensive evaluation and provision of chronic care management services.  SDOH (Social Determinants of Health) assessments and interventions performed: SDOH Interventions    Flowsheet Row Most Recent Value  SDOH Interventions   Physical Activity Interventions Patient Refused  Social Connections Interventions Intervention Not Indicated       Care Plan  Allergies  Allergen Reactions   Penicillins Itching and Rash    Medications Reviewed Today     Reviewed by Latoya Walsh, RPH-CPP (Pharmacist) on 12/13/20 at 30  Med List Status: <None>   Medication Order Taking? Sig Documenting Provider Last Dose Status Informant  ACCU-CHEK GUIDE test strip [id number] Yes  [provider] Taking Active   Accu-Chek Softclix Lancets lancets [id number] Yes 2 (two) times daily. [provider] Taking Active   amLODipine (NORVASC) 5 MG tablet [id number] Yes TAKE ONE TABLET BY MOUTH ONCE DAILY. Latoya Helper, MD Taking Active   blood glucose meter kit and supplies [id number] Yes Dispense based on patient and insurance preference. Use to monitor glucose twice daily.  Latoya Romp, NP Taking Active   calcitRIOL (ROCALTROL) 0.25 MCG capsule [id number] Yes Take 0.25 mcg by mouth daily. [provider] Taking Active   Cholecalciferol (VITAMIN D3) 25 MCG (1000 UT) CAPS [id number] Yes Take by mouth. [provider] Taking Active   furosemide (LASIX) 80 MG tablet [id number] Yes Take 80 mg  (1 tablet) in the morning and 40 mg (half tablet) in the afternoon. Latoya Lenis, MD Taking Active   gabapentin (NEURONTIN) 100 MG capsule [id number] Yes Take 1 capsule (100 mg total) by mouth 3 (three) times daily. Latoya Larsson, NP Taking Active   glipiZIDE (GLUCOTROL XL) 5 MG 24 hr tablet [id number] Yes TAKE ONE TABLET BY MOUTH EVERY MORNING WITH BREAKFAST Latoya Spar, MD Taking Active   hydrALAZINE (APRESOLINE) 50 MG tablet [id number] Yes TAKE ONE TABLET BY MOUTH EVERY 8 HOURS Latoya Spar, MD Taking Active   insulin glargine (LANTUS SOLOSTAR) 100 UNIT/ML Solostar Pen [id number] Yes Inject 10 Units into the skin at bedtime. Latoya Romp, NP Taking Active   Insulin Pen Needle (PEN NEEDLES) 31G X 5 MM MISC [id number]  Use to inject insulin once daily Latoya Romp, NP  Active   isosorbide mononitrate (IMDUR) 30 MG 24 hr tablet [id number] Yes Take 1 tablet (30 mg total) by mouth daily. Latoya Lenis, MD Taking Active   metoprolol succinate (TOPROL-XL) 50 MG 24 hr tablet [id number] Yes Take 1 tablet (50 mg total) by mouth daily. Take with or immediately following a meal. Latoya Lenis, MD Taking Active   mirtazapine (REMERON) 15 MG tablet [id number] Yes TAKE (1) TABLET BY MOUTH AT BEDTIME. Latoya Helper,  MD Taking Active   oxyCODONE-acetaminophen (PERCOCET) 5-325 MG tablet [id number]  Take 1 tablet by mouth every 6 (six) hours as needed for severe pain. Latoya Posner, MD  Active   oxyCODONE-acetaminophen (PERCOCET) 5-325 MG tablet [id number] Yes Take 1 tablet by mouth every 6 (six) hours as needed for severe pain. Latoya Posner, MD  Taking Active   rosuvastatin (CRESTOR) 10 MG tablet [id number] Yes Take 1 tablet (10 mg total) by mouth daily. Latoya Lenis, MD Taking Active   sodium bicarbonate 650 MG tablet [id number] Yes Take 650 mg by mouth 2 (two) times daily. [provider] Taking Active   Med List Note Latoya Walsh, [place] 63/81/77 1165):              Patient Active Problem List   Diagnosis Date Noted   Acute CHF (congestive heart failure) ([place]) 06/06/2020   Leg swelling 04/28/2020   Hyperlipidemia LDL goal <100 04/28/2020   Left hand pain 03/10/2020   Anemia 02/19/2020   Depression, major, single episode, in partial remission ([place]) 01/25/2019   Headache 11/13/2018   Insomnia 09/25/2018   Screening for colorectal cancer 07/23/2016   Lumbar back pain with radiculopathy affecting right lower extremity 03/27/2015   Non compliance with medical treatment 07/22/2014   Chronic hepatitis C without hepatic coma ([place]) 12/04/2013   Allergic rhinitis 11/03/2012   Severe hypertension 06/25/2011   Anxiety and depression 02/05/2011   Nicotine dependence 11/01/2010   Drug dependence, continuous abuse ([place]) 03/22/2010   Uncontrolled type 2 diabetes mellitus with hyperglycemia ([place]) 06/02/2007   Alcohol abuse 06/02/2007    Conditions to be addressed/monitored per PCP order:  CHF and DMII  Care Plan : Heart Failure (Adult)  Updates made by Latoya Montane, RN since 12/19/2020 12:00 AM     Problem: Symptom Exacerbation (Heart Failure)      Long-Range Goal: Symptom Exacerbation Prevented or Minimized   Start Date: 08/09/2020  Expected End Date: 02/04/2021  Recent Progress: On track  Priority: High  Note:   Current Barriers:  Knowledge deficit related to basic heart failure pathophysiology and self care management-Latoya Walsh is managing her health with the assistance of an Aide. Today patient was alert, but confused at times and requested RNCM to speak with her Aide. Latoya Walsh was not at home and  was unable to review her medications. She has recently began receiving Paramedic support for medication management. Latoya Walsh is unsure of medications. She is taking her "bubble packs" 3 times a day and is part of the Paramedicine program. [name] comes out every Friday and checks BP and BS. Ms. Toomey continues to feeling "sleepy" after taking her medications.  Ms. Fleig did receive a scale, but is not weighing daily. She is trying to get some exercise by walking up and down the road, but reports back pain afterward. Ms. Flammia is aware of local food pantries and plans to go later today. Ms. Soldo reports improvement in the way she feels during the day since taking toprol at night instead of during the day. She has cut back on smoking to 1/2 a cigarette after meals. Ms. Henkels reports taking all medications as instructed. She does not have an appetite. Patient is ready to start dialysis. Knowledge Deficits related to heart failure medications Patient does not have readable scale Literacy Barriers Financial strain Cognitive Deficits Unable to self administer medications as prescribed Does not adhere to provider recommendations re:  Does not contact provider office for questions/concerns Case  Manager Clinical Goal(s):  patient will weigh self daily and record patient will verbalize understanding of Heart Failure Action Plan and when to call doctor patient will take all Heart Failure mediations as prescribed patient will weigh daily and record (notifying MD of 3 lb weight gain over night or 5 lb in a week) Interventions:   Reviewed upcoming appointments Provided encouragement Basic overview and discussion of pathophysiology of Heart Failure reviewed  Provided written information on diabetic and dash diet Discussed importance of daily weight and advised patient to weigh and record daily, call provider with weight gain of 3# in one day or 5# in a week Provided therapeutic listening Advised patient  take all medications as instructed Patient Goals/Self-Care Activities - work on cutting out smoking completely - attend scheduled appointments - replace chips with a healthy snack like nuts - eat more whole grains, fruits and vegetables, lean meats and healthy fats - know when to call the doctor - track symptoms and what helps feel Walsh or worse - dress right for the weather, hot or cold  - call office if I gain more than 2 pounds in one day or 5 pounds in one week - use salt in moderation - watch for swelling in feet, ankles and legs every day - weigh myself daily  Follow Up Plan: Telephone follow up appointment with care management team member scheduled for:12/24/20 @ 2pm     Care Plan : Diabetes Type 2 (Adult)  Updates made by Hayla Montane, RN since 12/19/2020 12:00 AM     Problem: Glycemic Management (Diabetes, Type 2)      Long-Range Goal: Glycemic Management Optimized   Start Date: 08/09/2020  Expected End Date: 02/04/2021  Recent Progress: On track  Priority: Medium  Note:   Objective:  Lab Results  Component Value Date   HGBA1C 8.5 (A) 09/17/2020   HGBA1C 8.5 09/17/2020   HGBA1C 8.5 (A) 09/17/2020   HGBA1C 8.5 (A) 09/17/2020   Lab Results  Component Value Date   CREATININE 4.30 (H) 11/26/2020   CREATININE 2.81 (H) 06/10/2020   CREATININE 2.53 (H) 06/09/2020   No results found for: EGFR Current Barriers:  Knowledge Deficits related to basic Diabetes pathophysiology and self care/management-RNCM reviewed note in patients chart regarding elevated blood sugars and PCP would like for patient to be seen this week. Patient reports checking her BS daily, yesterday reading was 202. Patient with decreased appetite and unclear about what she is eating. Patient was evaluated and referral to Endocrinology was placed. Ms. Kirksey has started attending a bible study on Monday and Thursday evenings. Ms. Boutin has not been checking her BS, due to not having test strips. She had  surgery for fistula creation on 9/6 and reports doing fine. She continues to have Paramedic, [name] come out and assist with medication management. Ms. Ryther continues to not have any test strips to monitor blood sugar. Patient with decreased appetite. -Update-Ms. Mcphearson now has correct test strips, thanks to [employer]. [name] instructed patient on correct strips. Patient is not checking her blood sugar and states "my mind is not on checking my blood sugar." She is more concerned about starting dialysis in the next two weeks. She does continue to participate in bible study on Thursday nights and enjoys this connection. Knowledge Deficits related to medications used for management of diabetes Difficulty obtaining or cannot afford medications Does not have glucometer to monitor blood sugar Financial Constraints Cognitive Deficits Unable to self administer medications as prescribed Does  not contact provider office for questions/concerns Case Manager Clinical Goal(s):  patient will demonstrate improved adherence to prescribed treatment plan for diabetes self care/management as evidenced by: daily monitoring and recording of CBG,  adherence to ADA/ carb modified diet, adherence to prescribed medication regimen, contacting provider for new or worsened symptoms or questions.   Interventions:  Inter-disciplinary care team collaboration (see longitudinal plan of care) Provided education to patient about basic DM disease process Discussed plans with patient for ongoing care management follow up and provided patient with direct contact information for care management team Provided patient with written educational materials related to diabetic nutrition Reviewed upcoming appointments: 10/5 and 10/12 Review of patient status, including review of consultants reports, relevant laboratory and other test results, and medications completed. Discussed the importance of eating 3 small meals a day, including  protein and vegetables-Encouraged patient to eat a meal containing protein this morning, examples of appropriate food choices given Will follow closely with frequent telephone visits Reviewed medications and discussed recent changes made by Nephrology Self-Care Activities - Self administers oral medications as prescribed Attends all scheduled provider appointments Checks blood sugars as prescribed and utilize hyper and hypoglycemia protocol as needed Adheres to prescribed ADA/carb modified Patient Goals: - take your current glucometer to the Pharmacy to obtain accurate test strips - obtain appropriate test strips and check your blood sugar before breakfast and at bedtime daily - attend appointment on 9/20 with Endocrinology - adhere to a diabetic diet, eat three meals a day, including protein with each meal - check blood sugar at prescribed times - check blood sugar if I feel it is too high or too low - call your provider with abnormal readings - enter blood sugar readings and medication or insulin into daily log - take the blood sugar log to all doctor visits - take the blood sugar meter to all doctor visits  - increase exercise to 30 min a day/5 days a week Follow Up Plan: Telephone follow up appointment with care management team member scheduled for:12/24/20 @ 2pm      Follow Up:  Patient agrees to Care Plan and Follow-up.  Plan: The Managed Medicaid care management team will reach out to the patient again over the next 14 days.  Date/time of next scheduled RN care management/care coordination outreach:  12/24/20 @ 2pm  [name] RN, [place] RN Care Coordinator

## 2020-12-19 NOTE — Patient Instructions (Signed)
Visit Information  Ms. Capano was given information about Medicaid Managed Care team care coordination services as a part of their [place] Medicaid benefit. [name] verbally consented to engagement with the [facility] Managed Care team.   If you are experiencing a medical emergency, please call 911 or report to your local emergency department or urgent care.   If you have a non-emergency medical problem during routine business hours, please contact your provider's office and ask to speak with a nurse.   For questions related to your [facility], please call: [phone] or visit the homepage here: [email or web address]  If you would like to schedule transportation through your [facility], please call the following number at least 2 days in advance of your appointment: [phone].   Call the [place] at [phone], at any time, 24 hours a day, 7 days a week. If you are in danger or need immediate medical attention call 911.  If you would like help to quit smoking, call 1-800-QUIT-NOW (419) 033-0216) OR Espaol: 1-855-Djelo-Ya (4-403-474-2595) o para ms informacin haga clic aqu or Text READY to 200-400 to register via text  Ms. [name] - following are the goals we discussed in your visit today:   Goals Addressed             This Visit's Progress    Monitor and Manage My Blood Sugar-Diabetes Type 2       Timeframe:  Long-Range Goal Priority:  Medium Start Date:  08/09/20                           Expected End Date:  ongoing                     Follow Up Date  12/24/20   - take your current glucometer to the Pharmacy to obtain accurate test strips - obtain appropriate test strips and check your blood sugar before breakfast and at bedtime daily - attend appointment on 10/5 in IR and 10/12 for dialysis  - adhere to a diabetic  diet, eat three meals a day, including protein with each meal - check blood sugar if I feel it is too high or too low - call your provider with abnormal readings - enter blood sugar readings and medication or insulin into daily log - take the blood sugar log to all doctor visits - take the blood sugar meter to all doctor visits  - increase exercise to 30 min a day/5 days a week   Why is this important?   Checking your blood sugar at home helps to keep it from getting very high or very low.  Writing the results in a diary or log helps the doctor know how to care for you.  Your blood sugar log should have the time, date and the results.  Also, write down the amount of insulin or other medicine that you take.  Other information, like what you ate, exercise done and how you were feeling, will also be helpful.          Track and Manage Fluids and Swelling-Heart Failure       Timeframe:  Long-Range Goal Priority:  High Start Date:    08/09/20                         Expected End Date:    ongoing  Follow Up Date 12/24/20   - weigh at the same time each day - call office if I gain more than 2 pounds in one day or 5 pounds in one week - use salt in moderation - watch for swelling in feet, ankles and legs every day - call provider with any concerns or questions     Why is this important?   It is important to check your weight daily and watch how much salt and liquids you have.  It will help you to manage your heart failure.         Track and Manage Symptoms-Heart Failure       Timeframe:  Long-Range Goal Priority:  High Start Date:   08/09/20                          Expected End Date:    ongoing                  Follow Up Date 12/24/20    - work on cutting out smoking completely - replace chips with a healthy snack like nuts - eat more whole grains, fruits and vegetables, lean meats and healthy fats - know when to call the doctor - track symptoms and what helps  feel better or worse - dress right for the weather, hot or cold    Why is this important?   You will be able to handle your symptoms better if you keep track of them.  Making some simple changes to your lifestyle will help.  Eating healthy is one thing you can do to take good care of yourself.            Please see education materials related to diabetes provided by MyChart link.  The patient verbalized understanding of instructions provided today and agreed to receive a mailed copy of patient instruction and/or educational materials.  Telephone follow up appointment with Managed Medicaid care management team member scheduled for:12/24/20 @ 2pm  [name] RN, [place] RN Care Coordinator   Following is a copy of your plan of care:  Patient Care Plan: Heart Failure (Adult)     Problem Identified: Symptom Exacerbation (Heart Failure)      Long-Range Goal: Symptom Exacerbation Prevented or Minimized   Start Date: 08/09/2020  Expected End Date: 02/04/2021  Recent Progress: On track  Priority: High  Note:   Current Barriers:  Knowledge deficit related to basic heart failure pathophysiology and self care management-Ms. Parkhill is managing her health with the assistance of an Aide. Today patient was alert, but confused at times and requested RNCM to speak with her Aide. Ms. Stankowski was not at home and was unable to review her medications. She has recently began receiving Paramedic support for medication management. [name] is unsure of medications. She is taking her "bubble packs" 3 times a day and is part of the Paramedicine program. [name] comes out every Friday and checks BP and BS. Ms. Tryon continues to feeling "sleepy" after taking her medications.  [name] did receive a scale, but is not weighing daily. She is trying to get some exercise by walking up and down the road, but reports back pain afterward. Ms. Mangione is aware of local food  pantries and plans to go later today. Ms. Vaeth reports improvement in the way she feels during the day since taking toprol at night instead of during the day. She has  cut back on smoking to 1/2 a cigarette after meals. Ms. Dulski reports taking all medications as instructed. She does not have an appetite. Patient is ready to start dialysis. Knowledge Deficits related to heart failure medications Patient does not have readable scale Literacy Barriers Financial strain Cognitive Deficits Unable to self administer medications as prescribed Does not adhere to provider recommendations re:  Does not contact provider office for questions/concerns Case Manager Clinical Goal(s):  patient will weigh self daily and record patient will verbalize understanding of Heart Failure Action Plan and when to call doctor patient will take all Heart Failure mediations as prescribed patient will weigh daily and record (notifying MD of 3 lb weight gain over night or 5 lb in a week) Interventions:   Reviewed upcoming appointments Provided encouragement Basic overview and discussion of pathophysiology of Heart Failure reviewed  Provided written information on diabetic and dash diet Discussed importance of daily weight and advised patient to weigh and record daily, call provider with weight gain of 3# in one day or 5# in a week Provided therapeutic listening Advised patient take all medications as instructed Patient Goals/Self-Care Activities - work on cutting out smoking completely - attend scheduled appointments - replace chips with a healthy snack like nuts - eat more whole grains, fruits and vegetables, lean meats and healthy fats - know when to call the doctor - track symptoms and what helps feel better or worse - dress right for the weather, hot or cold  - call office if I gain more than 2 pounds in one day or 5 pounds in one week - use salt in moderation - watch for swelling in feet, ankles and legs  every day - weigh myself daily  Follow Up Plan: Telephone follow up appointment with care management team member scheduled for:12/24/20 @ 2pm     Patient Care Plan: Diabetes Type 2 (Adult)     Problem Identified: Glycemic Management (Diabetes, Type 2)      Long-Range Goal: Glycemic Management Optimized   Start Date: 08/09/2020  Expected End Date: 02/04/2021  Recent Progress: On track  Priority: Medium  Note:   Objective:  Lab Results  Component Value Date   HGBA1C 8.5 (A) 09/17/2020   HGBA1C 8.5 09/17/2020   HGBA1C 8.5 (A) 09/17/2020   HGBA1C 8.5 (A) 09/17/2020   Lab Results  Component Value Date   CREATININE 4.30 (H) 11/26/2020   CREATININE 2.81 (H) 06/10/2020   CREATININE 2.53 (H) 06/09/2020   No results found for: EGFR Current Barriers:  Knowledge Deficits related to basic Diabetes pathophysiology and self care/management-RNCM reviewed note in patients chart regarding elevated blood sugars and PCP would like for patient to be seen this week. Patient reports checking her BS daily, yesterday reading was 202. Patient with decreased appetite and unclear about what she is eating. Patient was evaluated and referral to Endocrinology was placed. Ms. Muecke has started attending a bible study on Monday and Thursday evenings. Ms. Ferraiolo has not been checking her BS, due to not having test strips. She had surgery for fistula creation on 9/6 and reports doing fine. She continues to have Paramedic, [name] come out and assist with medication management. Ms. Joslin continues to not have any test strips to monitor blood sugar. Patient with decreased appetite. -Update-Ms. Soza now has correct test strips, thanks to [employer]. [name] instructed patient on correct strips. Patient is not checking her blood sugar and states "my mind is not on checking my blood sugar." She is more  concerned about starting dialysis in the next two weeks. She does continue to participate in bible study on  Thursday nights and enjoys this connection. Knowledge Deficits related to medications used for management of diabetes Difficulty obtaining or cannot afford medications Does not have glucometer to monitor blood sugar Financial Constraints Cognitive Deficits Unable to self administer medications as prescribed Does not contact provider office for questions/concerns Case Manager Clinical Goal(s):  patient will demonstrate improved adherence to prescribed treatment plan for diabetes self care/management as evidenced by: daily monitoring and recording of CBG,  adherence to ADA/ carb modified diet, adherence to prescribed medication regimen, contacting provider for new or worsened symptoms or questions.   Interventions:  Inter-disciplinary care team collaboration (see longitudinal plan of care) Provided education to patient about basic DM disease process Discussed plans with patient for ongoing care management follow up and provided patient with direct contact information for care management team Provided patient with written educational materials related to diabetic nutrition Reviewed upcoming appointments: 10/5 and 10/12 Review of patient status, including review of consultants reports, relevant laboratory and other test results, and medications completed. Discussed the importance of eating 3 small meals a day, including protein and vegetables-Encouraged patient to eat a meal containing protein this morning, examples of appropriate food choices given Will follow closely with frequent telephone visits Reviewed medications and discussed recent changes made by Nephrology Self-Care Activities - Self administers oral medications as prescribed Attends all scheduled provider appointments Checks blood sugars as prescribed and utilize hyper and hypoglycemia protocol as needed Adheres to prescribed ADA/carb modified Patient Goals: - take your current glucometer to the Pharmacy to obtain accurate test  strips - obtain appropriate test strips and check your blood sugar before breakfast and at bedtime daily - attend appointment on 9/20 with Endocrinology - adhere to a diabetic diet, eat three meals a day, including protein with each meal - check blood sugar at prescribed times - check blood sugar if I feel it is too high or too low - call your provider with abnormal readings - enter blood sugar readings and medication or insulin into daily log - take the blood sugar log to all doctor visits - take the blood sugar meter to all doctor visits  - increase exercise to 30 min a day/5 days a week Follow Up Plan: Telephone follow up appointment with care management team member scheduled for:12/24/20 @ 2pm     Patient Care Plan: Medication management  Completed 12/16/2020   Problem Identified: Health Promotion or Disease Self-Management (General Plan of Care) Resolved 12/16/2020     Goal: Medication Management Completed 12/16/2020  Note:   Current Barriers:  Unable to self administer medications as prescribed Does not adhere to prescribed medication regimen Does not maintain contact with provider office Does not contact provider office for questions/concerns   Pharmacist Clinical Goal(s):  Over the next 30 days, patient will contact provider office for questions/concerns as evidenced notation of same in electronic health record through collaboration with PharmD and provider.    Interventions: Inter-disciplinary care team collaboration (see longitudinal plan of care) Comprehensive medication review performed; medication list updated in electronic medical record    Patient Goals/Self-Care Activities Over the next 30 days, patient will:  - collaborate with provider on medication access solutions  Follow Up Plan: The care management team will reach out to the patient again over the next 30 days.       Patient Care Plan: General Pharmacy (Adult)     Problem Identified:  Chronic  Disease Management   Priority: High     Long-Range Goal: Managing chronic disease therapies   Start Date: 12/13/2020  Expected End Date: 03/16/2021  This Visit's Progress: On track  Priority: High  Note:   Current Barriers:  Unable to achieve control of type 2 diabetes  Suboptimal therapeutic regimen for type 2 diabetes   Pharmacist Clinical Goal(s):  patient will achieve control of diabetes as evidenced by home blood glucose readings and A1C within goals maintain control of blood pressure as evidenced by continued office readings within goal  through collaboration with PharmD and provider.    Interventions: Inter-disciplinary care team collaboration (see longitudinal plan of care) Comprehensive medication review performed; medication list updated in electronic medical record  Diabetes:  Uncontrolled; current treatment: glipizide 36m, insulin glargine (Lantus) 10 units at bedtime;   Current glucose readings: fasting glucose: 169 this morning; reports sometimes 200's, post prandial glucose: 300's  Denies hypoglycemic/hyperglycemic symptoms  Current meal patterns:  Breakfast: skips breakfast and lunch Dinner: pizza Snacks: peanuts or fruit Dessert: jello (sugar free) Drinks: water, apple juice bottle 3x/day  Current exercise: denies  Educated on lifestyle modifcations Recommended patient follow-up with PCP to discuss further management of diabetes Collaborated with PCP to recommend therapy changes  Hypertension:  Controlled based on last office reading; current treatment: amlodipine 545m furosemide 8026mn the morning and 1/2 tablet in the afternoon, hydralazine 72m60msosorbide mononitrate 30mg24mtoprolol succinate 72mg;68murrent home readings: patient does not have blood pressure monitor  Denies hypotensive/hypertensive symptoms  Recommended patient continue to take medications and discussed she may also check blood pressure at local pharmacies for free with  machines  Patient Goals/Self-Care Activities Over the next 90 days, patient will:  - take medications as prescribed check glucose 2x/day, document, and provide at future appointments target a minimum of 150 minutes of moderate intensity exercise weekly engage in dietary modifications by decreasing carbohydrate intake  Follow Up Plan: Telephone follow up appointment with care management team member scheduled for: Care Manager 12/19/20 PharmD 02/11/21

## 2020-12-23 ENCOUNTER — Telehealth: Payer: Self-pay | Admitting: Licensed Clinical Social Worker

## 2020-12-23 NOTE — Telephone Encounter (Signed)
Ride scheduled through [employer] for pt ride to [facility] on 10/5 for IR appt. Pt ride arranged to get to hospital by 8am per AVS. When ready to return home will need to call [phone]. This was added to appt notes as well.   Westley Hummer, MSW, [place]  [phone]

## 2020-12-24 ENCOUNTER — Other Ambulatory Visit (HOSPITAL_COMMUNITY): Payer: Self-pay | Admitting: Physician Assistant

## 2020-12-24 ENCOUNTER — Other Ambulatory Visit: Payer: Self-pay

## 2020-12-24 ENCOUNTER — Other Ambulatory Visit: Payer: Self-pay | Admitting: *Deleted

## 2020-12-24 NOTE — Patient Instructions (Signed)
Visit Information  Latoya Walsh was given information about Medicaid Managed Care team care coordination services as a part of their [place] Medicaid benefit. Latoya Walsh verbally consented to engagement with the [facility] Managed Care team.   If you are experiencing a medical emergency, please call 911 or report to your local emergency department or urgent care.   If you have a non-emergency medical problem during routine business hours, please contact your provider's office and ask to speak with a nurse.   For questions related to your [facility], please call: [phone] or visit the homepage here: [email or web address]  If you would like to schedule transportation through your [facility], please call the following number at least 2 days in advance of your appointment: [phone].   Call the [facility] at [phone], at any time, 24 hours a day, 7 days a week. If you are in danger or need immediate medical attention call 911.  If you would like help to quit smoking, call 1-800-QUIT-NOW 229-242-1896) OR Espaol: 1-855-Djelo-Ya (6-222-979-8921) o para ms informacin haga clic aqu or Text READY to 200-400 to register via text  [name] - following are the goals we discussed in your visit today:   Goals Addressed             This Visit's Progress    Monitor and Manage My Blood Sugar-Diabetes Type 2       Timeframe:  Long-Range Goal Priority:  Medium Start Date:  08/09/20                           Expected End Date:  ongoing                     Follow Up Date  01/10/21   - take your current glucometer to the Pharmacy to obtain accurate test strips - obtain appropriate test strips and check your blood sugar before breakfast and at bedtime daily - attend appointment on 10/5 in IR, 10/12 for dialysis, 10/13 with PCP - adhere to  a diabetic diet, eat three meals a day, including protein with each meal - check blood sugar if I feel it is too high or too low - call your provider with abnormal readings - enter blood sugar readings and medication or insulin into daily log - take the blood sugar log to all doctor visits - take the blood sugar meter to all doctor visits  - increase exercise to 30 min a day/5 days a week   Why is this important?   Checking your blood sugar at home helps to keep it from getting very high or very low.  Writing the results in a diary or log helps the doctor know how to care for you.  Your blood sugar log should have the time, date and the results.  Also, write down the amount of insulin or other medicine that you take.  Other information, like what you ate, exercise done and how you were feeling, will also be helpful.          Track and Manage Fluids and Swelling-Heart Failure       Timeframe:  Long-Range Goal Priority:  High Start Date:    08/09/20                         Expected End Date:  ongoing                   Follow Up Date 01/10/21   - weigh at the same time each day - call office if I gain more than 2 pounds in one day or 5 pounds in one week - use salt in moderation - watch for swelling in feet, ankles and legs every day - call provider with any concerns or questions     Why is this important?   It is important to check your weight daily and watch how much salt and liquids you have.  It will help you to manage your heart failure.         Track and Manage Symptoms-Heart Failure       Timeframe:  Long-Range Goal Priority:  High Start Date:   08/09/20                          Expected End Date:    ongoing                  Follow Up Date 01/10/21    - work on cutting out smoking completely - replace chips with a healthy snack like nuts - eat more whole grains, fruits and vegetables, lean meats and healthy fats - know when to call the doctor - track symptoms and  what helps feel better or worse - dress right for the weather, hot or cold    Why is this important?   You will be able to handle your symptoms better if you keep track of them.  Making some simple changes to your lifestyle will help.  Eating healthy is one thing you can do to take good care of yourself.            Please see education materials related to diabetes and chronic kidney disease provided as print materials.   The patient verbalized understanding of instructions provided today and agreed to receive a mailed copy of patient instruction and/or educational materials.  Telephone follow up appointment with Managed Medicaid care management team member scheduled for:01/10/21 @ 9:15am  Latoya Joiner RN, BSN [facility] RN Care Coordinator   Following is a copy of your plan of care:  Patient Care Plan: Heart Failure (Adult)     Problem Identified: Symptom Exacerbation (Heart Failure)      Long-Range Goal: Symptom Exacerbation Prevented or Minimized   Start Date: 08/09/2020  Expected End Date: 02/04/2021  Recent Progress: On track  Priority: High  Note:   Current Barriers:  Knowledge deficit related to basic heart failure pathophysiology and self care management-Latoya Walsh is managing her health with the assistance of an Latoya Walsh. Today patient was alert, but confused at times and requested RNCM to speak with her Latoya Walsh. Latoya Walsh was not at home and was unable to review her medications. She has recently began receiving Paramedic support for medication management. Latoya Walsh is unsure of medications. She is taking her "bubble packs" 3 times a day and is part of the Paramedicine program. Latoya Walsh comes out every Friday and checks BP and BS. Latoya Walsh continues to feeling "sleepy" after taking her medications.  Latoya Walsh did receive a scale, but is not weighing daily. She is trying to get some exercise by walking up and down the road, but reports back pain  afterward. Latoya Walsh is aware of local food pantries and plans to go later today.  Latoya Walsh reports improvement in the way she feels during the day since taking toprol at night instead of during the day. She has cut back on smoking to 1/2 a cigarette after meals. Latoya Walsh reports taking all medications as instructed. She does not have an appetite. Patient is ready to start dialysis. Knowledge Deficits related to heart failure medications Patient does not have readable scale Literacy Barriers Financial strain Cognitive Deficits Unable to self administer medications as prescribed Does not adhere to provider recommendations re:  Does not contact provider office for questions/concerns Case Manager Clinical Goal(s):  patient will weigh self daily and record patient will verbalize understanding of Heart Failure Action Plan and when to call doctor patient will take all Heart Failure mediations as prescribed patient will weigh daily and record (notifying MD of 3 lb weight gain over night or 5 lb in a week) Interventions:   Reviewed upcoming appointments Provided encouragement Basic overview and discussion of pathophysiology of Heart Failure reviewed  Provided written information on diabetic and dash diet Discussed importance of daily weight and advised patient to weigh and record daily, call provider with weight gain of 3# in one day or 5# in a week Provided therapeutic listening Advised patient take all medications as instructed Patient Goals/Self-Care Activities - work on cutting out smoking completely - attend scheduled appointments - replace chips with a healthy snack like nuts - eat more whole grains, fruits and vegetables, lean meats and healthy fats - know when to call the doctor - track symptoms and what helps feel better or worse - dress right for the weather, hot or cold  - call office if I gain more than 2 pounds in one day or 5 pounds in one week - use salt in moderation -  watch for swelling in feet, ankles and legs every day - weigh myself daily  Follow Up Plan: Telephone follow up appointment with care management team member scheduled for:01/10/21 @ 9:15am     Patient Care Plan: Diabetes Type 2 (Adult)     Problem Identified: Glycemic Management (Diabetes, Type 2)      Long-Range Goal: Glycemic Management Optimized   Start Date: 08/09/2020  Expected End Date: 02/04/2021  Recent Progress: On track  Priority: Medium  Note:   Objective:  Lab Results  Component Value Date   HGBA1C 8.5 (A) 09/17/2020   HGBA1C 8.5 09/17/2020   HGBA1C 8.5 (A) 09/17/2020   HGBA1C 8.5 (A) 09/17/2020   Lab Results  Component Value Date   CREATININE 4.30 (H) 11/26/2020   CREATININE 2.81 (H) 06/10/2020   CREATININE 2.53 (H) 06/09/2020   No results found for: EGFR Current Barriers:  Knowledge Deficits related to basic Diabetes pathophysiology and self care/management-RNCM reviewed note in patients chart regarding elevated blood sugars and PCP would like for patient to be seen this week. Patient reports checking her BS daily, yesterday reading was 202. Patient with decreased appetite and unclear about what she is eating. Patient was evaluated and referral to Endocrinology was placed. Ms. Nulty has started attending a bible study on Monday and Thursday evenings. Ms. Locastro has not been checking her BS, due to not having test strips. She had surgery for fistula creation on 9/6 and reports doing fine. She continues to have Paramedic, Latoya Walsh come out and assist with medication management. Ms. Riddell continues to not have any test strips to monitor blood sugar. Patient with decreased appetite. Ms. Raburn now has correct test strips, thanks to [name]. [name] instructed patient  on correct strips. Patient is not checking her blood sugar and states "my mind is not on checking my blood sugar." She is more concerned about starting dialysis in the next two weeks. She does  continue to participate in bible study on Thursday nights and enjoys this connection.-Update-Ms. Bracher continues to have a poor appetite. She is not checking BS, but plans to start back on Monday 10/10. She is aware of all upcoming appointments and has transportation. She plans to take all of her medications and glucometer to her upcoming appointments. Knowledge Deficits related to medications used for management of diabetes Difficulty obtaining or cannot afford medications Does not have glucometer to monitor blood sugar Financial Constraints Cognitive Deficits Unable to self administer medications as prescribed Does not contact provider office for questions/concerns Case Manager Clinical Goal(s):  patient will demonstrate improved adherence to prescribed treatment plan for diabetes self care/management as evidenced by: daily monitoring and recording of CBG,  adherence to ADA/ carb modified diet, adherence to prescribed medication regimen, contacting provider for new or worsened symptoms or questions.   Interventions:  Inter-disciplinary care team collaboration (see longitudinal plan of care) Provided education to patient about basic DM disease process Discussed plans with patient for ongoing care management follow up and provided patient with direct contact information for care management team Provided patient with written educational materials related to diabetic nutrition Reviewed upcoming appointments: 10/5, 10/12 and 10/13 Review of patient status, including review of consultants reports, relevant laboratory and other test results, and medications completed. Discussed the importance of eating 3 small meals a day, including protein and vegetables-Encouraged patient to eat a meal containing protein this morning, examples of appropriate food choices given-revisited this information Will follow closely with frequent telephone visits Self-Care Activities - Self administers oral medications as  prescribed Attends all scheduled provider appointments Checks blood sugars as prescribed and utilize hyper and hypoglycemia protocol as needed Adheres to prescribed ADA/carb modified Patient Goals: - take your current glucometer to the Pharmacy to obtain accurate test strips - obtain appropriate test strips and check your blood sugar before breakfast and at bedtime daily - attend appointment on 10/5 in IR, 10/12 for dialysis, 10/13 with PCP - adhere to a diabetic diet, eat three meals a day, including protein with each meal - check blood sugar at prescribed times - check blood sugar if I feel it is too high or too low - call your provider with abnormal readings - enter blood sugar readings and medication or insulin into daily log - take the blood sugar log to all doctor visits - take the blood sugar meter to all doctor visits  - increase exercise to 30 min a day/5 days a week Follow Up Plan: Telephone follow up appointment with care management team member scheduled for:01/10/21 @ 9am     Patient Care Plan: Medication management  Completed 12/16/2020   Problem Identified: Health Promotion or Disease Self-Management (General Plan of Care) Resolved 12/16/2020     Goal: Medication Management Completed 12/16/2020  Note:   Current Barriers:  Unable to self administer medications as prescribed Does not adhere to prescribed medication regimen Does not maintain contact with provider office Does not contact provider office for questions/concerns   Pharmacist Clinical Goal(s):  Over the next 30 days, patient will contact provider office for questions/concerns as evidenced notation of same in electronic health record through collaboration with PharmD and provider.    Interventions: Inter-disciplinary care team collaboration (see longitudinal plan of care) Comprehensive medication review  performed; medication list updated in electronic medical record    Patient Goals/Self-Care  Activities Over the next 30 days, patient will:  - collaborate with provider on medication access solutions  Follow Up Plan: The care management team will reach out to the patient again over the next 30 days.       Patient Care Plan: General Pharmacy (Adult)     Problem Identified: Chronic Disease Management   Priority: High     Long-Range Goal: Managing chronic disease therapies   Start Date: 12/13/2020  Expected End Date: 03/16/2021  This Visit's Progress: On track  Priority: High  Note:   Current Barriers:  Unable to achieve control of type 2 diabetes  Suboptimal therapeutic regimen for type 2 diabetes   Pharmacist Clinical Goal(s):  patient will achieve control of diabetes as evidenced by home blood glucose readings and A1C within goals maintain control of blood pressure as evidenced by continued office readings within goal  through collaboration with PharmD and provider.    Interventions: Inter-disciplinary care team collaboration (see longitudinal plan of care) Comprehensive medication review performed; medication list updated in electronic medical record  Diabetes:  Uncontrolled; current treatment: glipizide 31m, insulin glargine (Lantus) 10 units at bedtime;   Current glucose readings: fasting glucose: 169 this morning; reports sometimes 200's, post prandial glucose: 300's  Denies hypoglycemic/hyperglycemic symptoms  Current meal patterns:  Breakfast: skips breakfast and lunch Dinner: pizza Snacks: peanuts or fruit Dessert: jello (sugar free) Drinks: water, apple juice bottle 3x/day  Current exercise: denies  Educated on lifestyle modifcations Recommended patient follow-up with PCP to discuss further management of diabetes Collaborated with PCP to recommend therapy changes  Hypertension:  Controlled based on last office reading; current treatment: amlodipine 577m furosemide 8049mn the morning and 1/2 tablet in the afternoon, hydralazine 22m43msosorbide  mononitrate 30mg43mtoprolol succinate 22mg;62murrent home readings: patient does not have blood pressure monitor  Denies hypotensive/hypertensive symptoms  Recommended patient continue to take medications and discussed she may also check blood pressure at local pharmacies for free with machines  Patient Goals/Self-Care Activities Over the next 90 days, patient will:  - take medications as prescribed check glucose 2x/day, document, and provide at future appointments target a minimum of 150 minutes of moderate intensity exercise weekly engage in dietary modifications by decreasing carbohydrate intake  Follow Up Plan: Telephone follow up appointment with care management team member scheduled for: Care Manager 12/19/20 PharmD 02/11/21

## 2020-12-24 NOTE — Patient Outreach (Signed)
Medicaid Managed Care   Nurse Care Manager Note  12/24/2020 Name:  Latoya Walsh MRN:  [id number] DOB:  03/11/1961  Latoya Walsh is an 60 y.o. year old female who is a primary patient of [name], MD.  The [facility] Managed Care Coordination team was consulted for assistance with:    CHF DMII  [name] was given information about Medicaid Managed Care Coordination team services today. Latoya Walsh Patient agreed to services and verbal consent obtained.  Engaged with patient by telephone for follow up visit in response to provider referral for case management and/or care coordination services.   Assessments/Interventions:  Review of past medical history, allergies, medications, health status, including review of consultants reports, laboratory and other test data, was performed as part of comprehensive evaluation and provision of chronic care management services.  SDOH (Social Determinants of Health) assessments and interventions performed: SDOH Interventions    Flowsheet Row Most Recent Value  SDOH Interventions   Housing Interventions Intervention Not Indicated       Care Plan  Allergies  Allergen Reactions   Penicillins Itching and Rash    Medications Reviewed Today     Reviewed by Noni Montane, RN (Registered Nurse) on 12/24/20 at 72  Med List Status: <None>   Medication Order Taking? Sig Documenting Provider Last Dose Status Informant  ACCU-CHEK GUIDE test strip [id number] No  [provider] Taking Active   Accu-Chek Softclix Lancets lancets [id number] No 2 (two) times daily. [provider] Taking Active   amLODipine (NORVASC) 5 MG tablet [id number] No TAKE ONE TABLET BY MOUTH ONCE DAILY. [name], MD Taking Active            Med Note Latoya Walsh, Latoya Walsh A   Thu Dec 19, 2020  1:11 PM) Recent change to 24m daily  blood glucose meter kit and supplies [id number] Dispense based on patient and insurance preference. Use to  monitor glucose twice daily. RBrita Romp NP Taking Active   calcitRIOL (ROCALTROL) 0.25 MCG capsule [id number] Take 0.25 mcg by mouth daily. [provider] Taking Active            Med Note (Latoya Walsh A   Thu Dec 19, 2020  1:12 PM) 9/27-dose change to two tablets daily  Cholecalciferol (VITAMIN D3) 25 MCG (1000 UT) CAPS [id number] Take by mouth. [provider] Taking Active   furosemide (LASIX) 80 MG tablet [id number] Take 80 mg  (1 tablet) in the morning and 40 mg (half tablet) in the afternoon. BArnoldo Lenis MD Taking Active   gabapentin (NEURONTIN) 100 MG capsule [id number] Take 1 capsule (100 mg total) by mouth 3 (three) times daily. GNoreene Larsson NP Taking Active   glipiZIDE (GLUCOTROL XL) 5 MG 24 hr tablet [id number] TAKE ONE TABLET BY MOUTH EVERY MORNING WITH BREAKFAST PLindell Spar MD Taking Active   hydrALAZINE (APRESOLINE) 50 MG tablet [id number] TAKE ONE TABLET BY MOUTH EVERY 8 HOURS [name], [name] MD Taking Active   insulin glargine (LANTUS SOLOSTAR) 100 UNIT/ML Solostar Pen [id number] Inject 10 Units into the skin at bedtime. RBrita Romp NP Taking Active   Insulin Pen Needle (PEN NEEDLES) 31G X 5 MM MISC [id number] Use to inject insulin once daily RBrita Romp NP Taking Active   isosorbide mononitrate (IMDUR) 30 MG 24 hr tablet [id number] Take 1 tablet (30 mg total) by mouth daily. BArnoldo Lenis MD Taking  Expired 12/13/20 2359   metoprolol succinate (TOPROL-XL) 50 MG 24 hr tablet [id number] No Take 1 tablet (50 mg total) by mouth daily. Take with or immediately following a meal. [name], MD Taking Expired 12/13/20 2359   mirtazapine (REMERON) 15 MG tablet [id number] No TAKE (1) TABLET BY MOUTH AT BEDTIME. [name], MD Taking Active   oxyCODONE-acetaminophen (PERCOCET) 5-325 MG tablet [id number] No Take 1 tablet by mouth every 6 (six) hours as needed for severe pain.  Patient not taking:  Reported on 12/19/2020   [name], MD Not Taking Active   oxyCODONE-acetaminophen (PERCOCET) 5-325 MG tablet [id number] No Take 1 tablet by mouth every 6 (six) hours as needed for severe pain.  Patient not taking: Reported on 12/19/2020   [name], MD Not Taking Active   rosuvastatin (CRESTOR) 10 MG tablet [id number] No Take 1 tablet (10 mg total) by mouth daily. [name], MD Taking Active   sevelamer carbonate (RENVELA) 800 MG tablet [id number]  Take 800 mg by mouth 3 (three) times daily with meals. [provider]  Active            Med Note (Jacobi Ryant A   Thu Dec 19, 2020  1:13 PM) This medication to be delivered today 12/19/20  sodium bicarbonate 650 MG tablet [id number] No Take 650 mg by mouth 2 (two) times daily. [provider] Taking Active   Med List Note Latoya Walsh, [place] 40/97/35 3299):              Patient Active Problem List   Diagnosis Date Noted   Acute CHF (congestive heart failure) ([place]) 06/06/2020   Leg swelling 04/28/2020   Hyperlipidemia LDL goal <100 04/28/2020   Left hand pain 03/10/2020   Anemia 02/19/2020   Depression, major, single episode, in partial remission ([place]) 01/25/2019   Headache 11/13/2018   Insomnia 09/25/2018   Screening for colorectal cancer 07/23/2016   Lumbar back pain with radiculopathy affecting right lower extremity 03/27/2015   Non compliance with medical treatment 07/22/2014   Chronic hepatitis C without hepatic coma ([place]) 12/04/2013   Allergic rhinitis 11/03/2012   Severe hypertension 06/25/2011   Anxiety and depression 02/05/2011   Nicotine dependence 11/01/2010   Drug dependence, continuous abuse ([place]) 03/22/2010   Uncontrolled type 2 diabetes mellitus with hyperglycemia ([place]) 06/02/2007   Alcohol abuse 06/02/2007    Conditions to be addressed/monitored per PCP order:  CHF and DMII  Care Plan : Heart Failure (Adult)  Updates made by Latoya Montane, RN since 12/24/2020 12:00 AM      Problem: Symptom Exacerbation (Heart Failure)      Long-Range Goal: Symptom Exacerbation Prevented or Minimized   Start Date: 08/09/2020  Expected End Date: 02/04/2021  Recent Progress: On track  Priority: High  Note:   Current Barriers:  Knowledge deficit related to basic heart failure pathophysiology and self care management-Ms. Mattson is managing her health with the assistance of an Aide. Today patient was alert, but confused at times and requested RNCM to speak with her Aide. Ms. Leoni was not at home and was unable to review her medications. She has recently began receiving Paramedic support for medication management. [name] is unsure of medications. She is taking her "bubble packs" 3 times a day and is part of the Paramedicine program. [name] comes out every Friday and checks BP and BS. Ms. Carton continues to feeling "sleepy" after taking her medications.  Ms. Thai did receive a  scale, but is not weighing daily. She is trying to get some exercise by walking up and down the road, but reports back pain afterward. Ms. Steinmetz is aware of local food pantries and plans to go later today. Ms. Elsbernd reports improvement in the way she feels during the day since taking toprol at night instead of during the day. She has cut back on smoking to 1/2 a cigarette after meals. Ms. Altidor reports taking all medications as instructed. She does not have an appetite. Patient is ready to start dialysis. Knowledge Deficits related to heart failure medications Patient does not have readable scale Literacy Barriers Financial strain Cognitive Deficits Unable to self administer medications as prescribed Does not adhere to provider recommendations re:  Does not contact provider office for questions/concerns Case Manager Clinical Goal(s):  patient will weigh self daily and record patient will verbalize understanding of Heart Failure Action Plan and when to call doctor patient will take all Heart Failure  mediations as prescribed patient will weigh daily and record (notifying MD of 3 lb weight gain over night or 5 lb in a week) Interventions:   Reviewed upcoming appointments Provided encouragement Basic overview and discussion of pathophysiology of Heart Failure reviewed  Provided written information on diabetic and dash diet Discussed importance of daily weight and advised patient to weigh and record daily, call provider with weight gain of 3# in one day or 5# in a week Provided therapeutic listening Advised patient take all medications as instructed Patient Goals/Self-Care Activities - work on cutting out smoking completely - attend scheduled appointments - replace chips with a healthy snack like nuts - eat more whole grains, fruits and vegetables, lean meats and healthy fats - know when to call the doctor - track symptoms and what helps feel better or worse - dress right for the weather, hot or cold  - call office if I gain more than 2 pounds in one day or 5 pounds in one week - use salt in moderation - watch for swelling in feet, ankles and legs every day - weigh myself daily  Follow Up Plan: Telephone follow up appointment with care management team member scheduled for:01/10/21 @ 9:15am     Care Plan : Diabetes Type 2 (Adult)  Updates made by Kamie Montane, RN since 12/24/2020 12:00 AM     Problem: Glycemic Management (Diabetes, Type 2)      Long-Range Goal: Glycemic Management Optimized   Start Date: 08/09/2020  Expected End Date: 02/04/2021  Recent Progress: On track  Priority: Medium  Note:   Objective:  Lab Results  Component Value Date   HGBA1C 8.5 (A) 09/17/2020   HGBA1C 8.5 09/17/2020   HGBA1C 8.5 (A) 09/17/2020   HGBA1C 8.5 (A) 09/17/2020   Lab Results  Component Value Date   CREATININE 4.30 (H) 11/26/2020   CREATININE 2.81 (H) 06/10/2020   CREATININE 2.53 (H) 06/09/2020   No results found for: EGFR Current Barriers:  Knowledge Deficits related to  basic Diabetes pathophysiology and self care/management-RNCM reviewed note in patients chart regarding elevated blood sugars and PCP would like for patient to be seen this week. Patient reports checking her BS daily, yesterday reading was 202. Patient with decreased appetite and unclear about what she is eating. Patient was evaluated and referral to Endocrinology was placed. Ms. Robello has started attending a bible study on Monday and Thursday evenings. Ms. Lobello has not been checking her BS, due to not having test strips. 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 She continues to have Paramedic, [name] come out and assist with medication management. Ms. Anspaugh continues to not have any test strips to monitor blood sugar. Patient with decreased appetite. Ms. Marsicano now has correct test strips, thanks to [name]. [name] instructed patient on correct strips. Patient is not checking her blood sugar and states "my mind is not on checking my blood sugar." She is more concerned about starting dialysis in the next two weeks. She does continue to participate in bible study on Thursday nights and enjoys this connection.-Update-Ms. Uffelman continues to have a poor appetite. She is not checking BS, but plans to start back on Monday 10/10. She is aware of all upcoming appointments and has transportation. She plans to take all of her medications and glucometer to her upcoming appointments. Knowledge Deficits related to medications used for management of diabetes Difficulty obtaining or cannot afford medications Does not have glucometer to monitor blood sugar Financial Constraints Cognitive Deficits Unable to self administer medications as prescribed Does not contact provider office for questions/concerns Case Manager Clinical Goal(s):  patient will demonstrate improved adherence to prescribed treatment plan for diabetes self care/management as evidenced by: daily monitoring and  recording of CBG,  adherence to ADA/ carb modified diet, adherence to prescribed medication regimen, contacting provider for new or worsened symptoms or questions.   Interventions:  Inter-disciplinary care team collaboration (see longitudinal plan of care) Provided education to patient about basic DM disease process Discussed plans with patient for ongoing care management follow up and provided patient with direct contact information for care management team Provided patient with written educational materials related to diabetic nutrition Reviewed upcoming appointments: 10/5, 10/12 and 10/13 Review of patient status, including review of consultants reports, relevant laboratory and other test results, and medications completed. Discussed the importance of eating 3 small meals a day, including protein and vegetables-Encouraged patient to eat a meal containing protein this morning, examples of appropriate food choices given-revisited this information Will follow closely with frequent telephone visits Self-Care Activities - Self administers oral medications as prescribed Attends all scheduled provider appointments Checks blood sugars as prescribed and utilize hyper and hypoglycemia protocol as needed Adheres to prescribed ADA/carb modified Patient Goals: - take your current glucometer to the Pharmacy to obtain accurate test strips - obtain appropriate test strips and check your blood sugar before breakfast and at bedtime daily - attend appointment on 10/5 in IR, 10/12 for dialysis, 10/13 with PCP - adhere to a diabetic diet, eat three meals a day, including protein with each meal - check blood sugar at prescribed times - check blood sugar if I feel it is too high or too low - call your provider with abnormal readings - enter blood sugar readings and medication or insulin into daily log - take the blood sugar log to all doctor visits - take the blood sugar meter to all doctor visits  - increase  exercise to 30 min a day/5 days a week Follow Up Plan: Telephone follow up appointment with care management team member scheduled for:01/10/21 @ 9am      Follow Up:  Patient agrees to Care Plan and Follow-up.  Plan: The Managed Medicaid care management team will reach out to the patient again over the next 14 days.  Date/time of next scheduled RN care management/care coordination outreach:  01/10/21 @ 9:15am  [name] RN, BSN [place]  [employer] manager

## 2020-12-24 NOTE — Telephone Encounter (Signed)
[place], [place] paramedic, reached out to this Probation officer to inquire about upcoming appt times. Provided [name] with information about all upcoming appts this month. She will write them on pt calendar next time she visits and will call pt and provide her those dates and times today. She is aware ride scheduled for pt for tomorrow.   Westley Hummer, MSW, [place]  [phone]

## 2020-12-25 ENCOUNTER — Ambulatory Visit (HOSPITAL_COMMUNITY)
Admission: RE | Admit: 2020-12-25 | Discharge: 2020-12-25 | Disposition: A | Discharge status: Home / Self Care | Payer: Medicaid Other | Source: Ambulatory Visit | Attending: Nephrology | Admitting: Nephrology

## 2020-12-25 ENCOUNTER — Other Ambulatory Visit: Payer: Self-pay | Admitting: Radiology

## 2020-12-25 ENCOUNTER — Other Ambulatory Visit: Payer: Self-pay

## 2020-12-25 ENCOUNTER — Encounter: Payer: Medicaid Other | Admitting: Vascular Surgery

## 2020-12-25 ENCOUNTER — Encounter (HOSPITAL_COMMUNITY): Payer: Self-pay

## 2020-12-25 ENCOUNTER — Other Ambulatory Visit (HOSPITAL_COMMUNITY): Payer: Self-pay | Admitting: Nephrology

## 2020-12-25 DIAGNOSIS — Z880 Allergy status to penicillin: Secondary | ICD-10-CM | POA: Diagnosis not present

## 2020-12-25 DIAGNOSIS — N185 Chronic kidney disease, stage 5: Secondary | ICD-10-CM | POA: Diagnosis not present

## 2020-12-25 DIAGNOSIS — E213 Hyperparathyroidism, unspecified: Secondary | ICD-10-CM | POA: Diagnosis not present

## 2020-12-25 DIAGNOSIS — Z794 Long term (current) use of insulin: Secondary | ICD-10-CM | POA: Insufficient documentation

## 2020-12-25 DIAGNOSIS — Z8616 Personal history of COVID-19: Secondary | ICD-10-CM | POA: Diagnosis not present

## 2020-12-25 DIAGNOSIS — Z4901 Encounter for fitting and adjustment of extracorporeal dialysis catheter: Secondary | ICD-10-CM | POA: Diagnosis not present

## 2020-12-25 DIAGNOSIS — E1122 Type 2 diabetes mellitus with diabetic chronic kidney disease: Secondary | ICD-10-CM | POA: Insufficient documentation

## 2020-12-25 DIAGNOSIS — Z79899 Other long term (current) drug therapy: Secondary | ICD-10-CM | POA: Diagnosis not present

## 2020-12-25 DIAGNOSIS — B1920 Unspecified viral hepatitis C without hepatic coma: Secondary | ICD-10-CM | POA: Insufficient documentation

## 2020-12-25 DIAGNOSIS — F17210 Nicotine dependence, cigarettes, uncomplicated: Secondary | ICD-10-CM | POA: Insufficient documentation

## 2020-12-25 DIAGNOSIS — I132 Hypertensive heart and chronic kidney disease with heart failure and with stage 5 chronic kidney disease, or end stage renal disease: Secondary | ICD-10-CM | POA: Insufficient documentation

## 2020-12-25 DIAGNOSIS — Z7984 Long term (current) use of oral hypoglycemic drugs: Secondary | ICD-10-CM | POA: Insufficient documentation

## 2020-12-25 DIAGNOSIS — I5022 Chronic systolic (congestive) heart failure: Secondary | ICD-10-CM | POA: Diagnosis not present

## 2020-12-25 DIAGNOSIS — N186 End stage renal disease: Secondary | ICD-10-CM | POA: Diagnosis not present

## 2020-12-25 HISTORY — PX: IR US GUIDE VASC ACCESS RIGHT: IMG2390

## 2020-12-25 HISTORY — PX: IR FLUORO GUIDE CV LINE RIGHT: IMG2283

## 2020-12-25 LAB — GLUCOSE, CAPILLARY
Glucose-Capillary: 122 mg/dL — ABNORMAL HIGH (ref 70–99)
Glucose-Capillary: 147 mg/dL — ABNORMAL HIGH (ref 70–99)

## 2020-12-25 MED ORDER — FENTANYL CITRATE (PF) 100 MCG/2ML IJ SOLN
INTRAMUSCULAR | Status: AC
  Filled 2020-12-25: qty 2

## 2020-12-25 MED ORDER — FENTANYL CITRATE (PF) 100 MCG/2ML IJ SOLN
INTRAMUSCULAR | Status: DC | PRN
  Administered 2020-12-25: 25 ug via INTRAVENOUS

## 2020-12-25 MED ORDER — VANCOMYCIN HCL IN DEXTROSE 1-5 GM/200ML-% IV SOLN: 1000.0000 mg | Freq: Once | INTRAVENOUS | Status: AC

## 2020-12-25 MED ORDER — LIDOCAINE HCL (PF) 1 % IJ SOLN
INTRAMUSCULAR | Status: DC | PRN
  Administered 2020-12-25: 10 mL

## 2020-12-25 MED ORDER — VANCOMYCIN HCL IN DEXTROSE 1-5 GM/200ML-% IV SOLN
INTRAVENOUS | Status: AC
  Administered 2020-12-25: 1000 mg via INTRAVENOUS
  Filled 2020-12-25: qty 200

## 2020-12-25 MED ORDER — LIDOCAINE HCL 1 % IJ SOLN
INTRAMUSCULAR | Status: AC
  Filled 2020-12-25: qty 20

## 2020-12-25 MED ORDER — MIDAZOLAM HCL 2 MG/2ML IJ SOLN
INTRAMUSCULAR | Status: DC | PRN
  Administered 2020-12-25: 1 mg via INTRAVENOUS

## 2020-12-25 MED ORDER — SODIUM CHLORIDE 0.9 % IV SOLN: INTRAVENOUS | Status: DC

## 2020-12-25 MED ORDER — MIDAZOLAM HCL 2 MG/2ML IJ SOLN
INTRAMUSCULAR | Status: AC
  Filled 2020-12-25: qty 2

## 2020-12-25 MED ORDER — HEPARIN SODIUM (PORCINE) 1000 UNIT/ML IJ SOLN
INTRAMUSCULAR | Status: AC
  Filled 2020-12-25: qty 1

## 2020-12-25 NOTE — Procedures (Signed)
Interventional Radiology Procedure Note  Procedure: Tunneled HD cath placement  Indication: Renal failure  Findings: Please refer to procedural dictation for full description.  Complications: None  EBL: < 10 mL  Miachel Roux, MD [phone]

## 2020-12-25 NOTE — H&P (Signed)
Chief Complaint: Patient was seen in consultation today for tunneled dialysis catheter placement at the request of [place]  Referring Physician(s): Erda S  Supervising Physician: Mir, Sharen Heck  Patient Status: [facility] - Out-pt  History of Present Illness: Latoya Walsh is a 60 y.o. female   CKD 5 DM; Nephrotic range proteinuria Hyperparathyroidism Hep C and CHF  Dialysis graft was placed in OR with Dr Donnetta Hutching 11/26/20 Not yet mature Nephrology initiating dialysis tomorrow Scheduled now for tunneled dialysis catheter placement  Past Medical History:  Diagnosis Date   Allergic rhinitis    Anxiety    CHF (congestive heart failure) ([place])    a. EF 30-35% by echo in 05/2020   Chronic back pain    Chronic hepatitis C without hepatic coma ([place])    COVID-19 virus infection 12/26/2019   Depression    Diabetes mellitus    Hepatitis C    Hypertension    Noncompliance    Poor appetite 07/17/2014   Substance abuse ([place])    HX of drug use and alcohol use    Past Surgical History:  Procedure Laterality Date   APPENDECTOMY  2004   AV FISTULA PLACEMENT Left 11/26/2020   Procedure: LEFT ARM ARTERIOVENOUS (AV) FISTULA CREATION;  Surgeon: [name], MD;  Location: [facility];  Service: Vascular;  Laterality: Left;    Allergies: Penicillins  Medications: Prior to Admission medications   Medication Sig Start Date End Date Taking? Authorizing Provider  amLODipine (NORVASC) 5 MG tablet TAKE ONE TABLET BY MOUTH ONCE DAILY. 10/02/20  Yes [name], MD  calcitRIOL (ROCALTROL) 0.25 MCG capsule Take 0.5 mcg by mouth daily.   Yes [provider]  furosemide (LASIX) 80 MG tablet Take 80 mg  (1 tablet) in the morning and 40 mg (half tablet) in the afternoon. 10/16/20  Yes Branch, [name], MD  gabapentin (NEURONTIN) 100 MG capsule Take 1 capsule (100 mg total) by mouth 3 (three) times daily. 09/05/20  Yes [name], NP  glipiZIDE (GLUCOTROL XL) 5  MG 24 hr tablet TAKE ONE TABLET BY MOUTH EVERY MORNING WITH BREAKFAST Patient taking differently: Take 5 mg by mouth daily with breakfast. 12/10/20  Yes [name], MD  hydrALAZINE (APRESOLINE) 50 MG tablet TAKE ONE TABLET BY MOUTH EVERY 8 HOURS Patient taking differently: Take 50 mg by mouth 3 (three) times daily. 11/11/20  Yes [name], MD  insulin glargine (LANTUS SOLOSTAR) 100 UNIT/ML Solostar Pen Inject 10 Units into the skin at bedtime. 11/04/20  Yes [name], NP  isosorbide mononitrate (IMDUR) 30 MG 24 hr tablet Take 1 tablet (30 mg total) by mouth daily. 08/22/20 12/25/20 Yes BranchAlphonse Guild, MD  metoprolol succinate (TOPROL-XL) 50 MG 24 hr tablet Take 1 tablet (50 mg total) by mouth daily. Take with or immediately following a meal. 08/22/20 12/25/20 Yes Branch, [name], MD  mirtazapine (REMERON) 15 MG tablet TAKE (1) TABLET BY MOUTH AT BEDTIME. Patient taking differently: Take 15 mg by mouth at bedtime. 10/02/20  Yes [name], MD  rosuvastatin (CRESTOR) 10 MG tablet Take 1 tablet (10 mg total) by mouth daily. 08/22/20  Yes BranchAlphonse Guild, MD  sevelamer carbonate (RENVELA) 800 MG tablet Take 800 mg by mouth 3 (three) times daily with meals.   Yes [provider]  sodium bicarbonate 650 MG tablet Take 650 mg by mouth 3 (three) times daily. 08/08/20  Yes [provider]  ACCU-CHEK GUIDE test strip  12/06/20   [provider]  Accu-Chek Softclix Lancets lancets 2 (two) times daily. 11/29/20   [provider]  blood glucose meter kit and supplies Dispense based on patient and insurance preference. Use to monitor glucose twice daily. 11/29/20   [name], NP  Insulin Pen Needle (PEN NEEDLES) 31G X 5 MM MISC Use to inject insulin once daily 11/04/20   [name], NP  oxyCODONE-acetaminophen (PERCOCET) 5-325 MG tablet Take 1 tablet by mouth every 6 (six) hours as needed for severe pain. Patient not taking: No sig reported  11/26/20   Early, Arvilla Meres, MD  oxyCODONE-acetaminophen (PERCOCET) 5-325 MG tablet Take 1 tablet by mouth every 6 (six) hours as needed for severe pain. Patient not taking: No sig reported 11/26/20   Early, Arvilla Meres, MD     Family History  Problem Relation Age of Onset   Diabetes Sister        Twin - AIDS     Social History   Socioeconomic History   Marital status: Single    Spouse name: Not on file   Number of children: 3   Years of education: Not on file   Highest education level: Not on file  Occupational History   Occupation: Disabled  Tobacco Use   Smoking status: Light Smoker    Packs/day: 0.25    Types: Cigarettes   Smokeless tobacco: Never  Vaping Use   Vaping Use: Never used  Substance and Sexual Activity   Alcohol use: Not Currently    Alcohol/week: 40.0 standard drinks    Types: 40 Cans of beer per week    Comment: 2 16oz cans of beer a day   Drug use: Not Currently    Types: Marijuana, Cocaine    Comment: Last smoked marijuana yesterday 10/18/14   Sexual activity: Yes    Birth control/protection: Condom  Other Topics Concern   Not on file  Social History Narrative   Not on file   Social Determinants of Health   Financial Resource Strain: Low Risk    Difficulty of Paying Living Expenses: Not hard at all  Food Insecurity: Food Insecurity Present   Worried About Charity fundraiser in the Last Year: Sometimes true   Arboriculturist in the Last Year: Sometimes true  Transportation Needs: No Transportation Needs   Lack of Transportation (Medical): No   Lack of Transportation (Non-Medical): No  Physical Activity: Inactive   Days of Exercise per Week: 0 days   Minutes of Exercise per Session: 0 min  Stress: Not on file  Social Connections: Moderately Integrated   Frequency of Communication with Friends and Family: More than three times a week   Frequency of Social Gatherings with Friends and Family: Once a week   Attends Religious Services: More than 4 times  per year   Active Member of [employer] or Organizations: Yes   Attends Music therapist: More than 4 times per year   Marital Status: Separated    Review of Systems: A 12 point ROS discussed and pertinent positives are indicated in the HPI above.  All other systems are negative.  Review of Systems  Constitutional:  Positive for activity change and fatigue. Negative for fever.  Respiratory:  Negative for cough and shortness of breath.   Cardiovascular:  Negative for chest pain.  Gastrointestinal:  Negative for abdominal pain.  Neurological:  Positive for weakness.  Psychiatric/Behavioral:  Negative for behavioral problems and confusion.  Vital Signs: BP (!) 133/52   Pulse 72   Temp 97.7 F (36.5 C) (Oral)   Ht 5' 7"  (1.702 m)   Wt 175 lb (79.4 kg)   LMP 06/23/2010   SpO2 100%   BMI 27.41 kg/m   Physical Exam Vitals reviewed.  HENT:     Mouth/Throat:     Mouth: Mucous membranes are moist.  Cardiovascular:     Rate and Rhythm: Normal rate and regular rhythm.     Heart sounds: Normal heart sounds.  Pulmonary:     Breath sounds: Normal breath sounds.  Abdominal:     Palpations: Abdomen is soft.  Musculoskeletal:        General: Normal range of motion.     Comments: Left arm fistula Healed; +pulse  Skin:    General: Skin is warm.  Neurological:     Mental Status: She is alert and oriented to person, place, and time.  Psychiatric:        Behavior: Behavior normal.    Imaging: DG Chest 2 View  Result Date: 12/11/2020 CLINICAL DATA:  Benign hypertensive kidney disease, shortness of breath for 1 month, diabetes mellitus, hypertension, smoker, had COVID-19 in October 2021 EXAM: CHEST - 2 VIEW COMPARISON:  06/06/2020 FINDINGS: Enlargement of cardiac silhouette with pulmonary vascular congestion. Atherosclerotic calcification aorta. Minimal residual atelectasis at RIGHT lung base at site of prior consolidation. Remaining lungs clear. No acute infiltrate, pleural  effusion, or pneumothorax. Biconvex thoracolumbar scoliosis. IMPRESSION: Enlargement of cardiac silhouette with pulmonary vascular congestion. Minimal residual atelectasis at RIGHT lung base with resolution of RIGHT basilar infiltrate seen on prior study. Electronically Signed   By: Lavonia Dana M.D.   On: 12/11/2020 14:54    Labs:  CBC: Recent Labs    02/19/20 1057 05/08/20 0638 06/06/20 1447 06/07/20 0507 11/26/20 1054  WBC 8.5 6.9 6.9 6.7  --   HGB 10.8* 10.5* 10.6* 9.7* 10.2*  HCT 33.3* 33.6* 35.2* 32.0* 30.0*  PLT 201 218 206 210  --     COAGS: Recent Labs    05/08/20 0638  INR 1.1    BMP: Recent Labs    01/05/20 1132 01/17/20 1420 05/02/20 1535 05/08/20 0638 06/07/20 0507 06/08/20 0540 06/09/20 0458 06/10/20 0501 11/26/20 1054  NA 141   < > 144   < > 141 139 139 140 143  K 4.5   < > 4.7   < > 4.0 4.2 4.1 3.9 4.3  CL 108*   < > 110*   < > 112* 109 107 106 111  CO2 21   < > 17*   < > 21* 21* 23 24  --   GLUCOSE 94   < > 159*   < > 238* 170* 153* 124* 174*  BUN 33*   < > 38*   < > 26* 30* 31* 35* 41*  CALCIUM 8.5*   < > 8.5*   < > 8.1* 8.0* 8.1* 8.2*  --   CREATININE 1.92*   < > 2.02*   < > 2.45* 2.54* 2.53* 2.81* 4.30*  GFRNONAA 29*   < > 27*   < > 22* 21* 22* 19*  --   GFRAA 33*  --  31*  --   --   --   --   --   --    < > = values in this interval not displayed.    LIVER FUNCTION TESTS: Recent Labs    01/05/20 1132 01/17/20 1420 05/02/20 1535 06/06/20  1447  BILITOT <0.2 0.8 <0.2 0.5  AST 19 23 16 26   ALT 25 17 22 20   ALKPHOS 141* 74 118 87  PROT 6.2 6.3* 6.2 6.1*  ALBUMIN 3.0* 2.5* 3.3* 2.7*    TUMOR MARKERS: No results for input(s): AFPTM, CEA, CA199, CHROMGRNA in the last 8760 hours.  Assessment and Plan:  Immature left arm dialysis fistula Placed 11/26/20 Need to initiate dialysis tomorrow per Nephrology Scheduled for tunneled dialysis catheter placement Risks and benefits discussed with the patient including, but not limited to  bleeding, infection, vascular injury, pneumothorax which may require chest tube placement, air embolism or even death  All of the patient's questions were answered, patient is agreeable to proceed. Consent signed and in chart.   Thank you for this interesting consult.  I greatly enjoyed meeting Latoya Walsh and look forward to participating in their care.  A copy of this report was sent to the requesting provider on this date.  Electronically Signed: Lavonia Drafts, PA-C 12/25/2020, 8:59 AM   I spent a total of  30 Minutes   in face to face in clinical consultation, greater than 50% of which was counseling/coordinating care for tunneled dialysis catheter placement

## 2020-12-30 ENCOUNTER — Telehealth: Payer: Self-pay | Admitting: Licensed Clinical Social Worker

## 2020-12-30 NOTE — Telephone Encounter (Signed)
LCSW reached out to pt this morning to ensure she has transportation to her two upcoming appointments on 10/12 and 10/13. Pt reached at [phone]. She shares that she feels discomfort from the areas where her procedures were last week and that they did not prescribe her anything for pain. LCSW shared that pt should reach out to [facility] office if she would like to see if they have any recommendations. She declines and states she will wait until her appt. She does have rides per her report to her appts this week. She has dialysis tomorrow and shares that she does not like that she has to go to [facility] and that this frustrates her. I explained that although I do not know what exactly led to her being assigned to [facility] that likely it had to do with availability of time and dates to dialyze. I encouraged her to speak with someone tomorrow in [place] to let them know of her interest in dialyzing closer to home if/when a seat time opens.  Pt states understanding- she will f/u with me or [name] if things change and she needs assistance w/ a ride this week.  Westley Hummer, MSW, [place]  [phone]

## 2021-01-01 ENCOUNTER — Ambulatory Visit (INDEPENDENT_AMBULATORY_CARE_PROVIDER_SITE_OTHER): Payer: Medicaid Other

## 2021-01-01 ENCOUNTER — Other Ambulatory Visit (HOSPITAL_COMMUNITY): Payer: Self-pay | Admitting: Vascular Surgery

## 2021-01-01 ENCOUNTER — Encounter: Payer: Self-pay | Admitting: Vascular Surgery

## 2021-01-01 ENCOUNTER — Ambulatory Visit (INDEPENDENT_AMBULATORY_CARE_PROVIDER_SITE_OTHER): Payer: Medicaid Other | Admitting: Vascular Surgery

## 2021-01-01 ENCOUNTER — Other Ambulatory Visit: Payer: Self-pay

## 2021-01-01 DIAGNOSIS — Z48812 Encounter for surgical aftercare following surgery on the circulatory system: Secondary | ICD-10-CM

## 2021-01-01 DIAGNOSIS — Z992 Dependence on renal dialysis: Secondary | ICD-10-CM

## 2021-01-01 DIAGNOSIS — N186 End stage renal disease: Secondary | ICD-10-CM

## 2021-01-01 NOTE — Progress Notes (Signed)
V[facility] and [facility]in Specialist of [place]  Patient name: Latoya Walsh MRN: [id number] DOB: [birth year] Sex: female  REASON FOR VISIT: Follow-up left radiocephalic AV fistula creation on 11/26/2020  HPI: Latoya Walsh is a 60 y.o. female here today for follow-up.  Since she underwent fistula replacement, she has progressed to renal failure and had a tunneled hemodialysis catheter placed in [facility] by interventional radiology on 12/25/2020.  She reports continued pain at the insertion site of her catheter.  The catheter itself looks good.  She reports some pain in her left hand when manipulating her cephalic vein above the radiocephalic anastomosis but denies any steal type symptoms.  Current Outpatient Medications  Medication Sig Dispense Refill   ACCU-CHEK GUIDE test strip      Accu-Chek Softclix Lancets lancets 2 (two) times daily.     amLODipine (NORVASC) 5 MG tablet TAKE ONE TABLET BY MOUTH ONCE DAILY. 30 tablet 0   blood glucose meter kit and supplies Dispense based on patient and insurance preference. Use to monitor glucose twice daily. 1 each 0   calcitRIOL (ROCALTROL) 0.25 MCG capsule Take 0.5 mcg by mouth daily.     furosemide (LASIX) 80 MG tablet Take 80 mg  (1 tablet) in the morning and 40 mg (half tablet) in the afternoon. 135 tablet 3   gabapentin (NEURONTIN) 100 MG capsule Take 1 capsule (100 mg total) by mouth 3 (three) times daily. 90 capsule 3   glipiZIDE (GLUCOTROL XL) 5 MG 24 hr tablet TAKE ONE TABLET BY MOUTH EVERY MORNING WITH BREAKFAST (Patient taking differently: Take 5 mg by mouth daily with breakfast.) 90 tablet 0   hydrALAZINE (APRESOLINE) 50 MG tablet TAKE ONE TABLET BY MOUTH EVERY 8 HOURS (Patient taking differently: Take 50 mg by mouth 3 (three) times daily.) 90 tablet 3   insulin glargine (LANTUS SOLOSTAR) 100 UNIT/ML Solostar Pen Inject 10 Units into the skin at bedtime. 15 mL 1   Insulin Pen Needle (PEN NEEDLES) 31G X 5  MM MISC Use to inject insulin once daily 90 each 3   isosorbide mononitrate (IMDUR) 30 MG 24 hr tablet Take 1 tablet (30 mg total) by mouth daily. 90 tablet 3   metoprolol succinate (TOPROL-XL) 50 MG 24 hr tablet Take 1 tablet (50 mg total) by mouth daily. Take with or immediately following a meal. 90 tablet 3   mirtazapine (REMERON) 15 MG tablet TAKE (1) TABLET BY MOUTH AT BEDTIME. (Patient taking differently: Take 15 mg by mouth at bedtime.) 30 tablet 0   oxyCODONE-acetaminophen (PERCOCET) 5-325 MG tablet Take 1 tablet by mouth every 6 (six) hours as needed for severe pain. (Patient not taking: No sig reported) 8 tablet 0   oxyCODONE-acetaminophen (PERCOCET) 5-325 MG tablet Take 1 tablet by mouth every 6 (six) hours as needed for severe pain. (Patient not taking: No sig reported) 8 tablet 0   rosuvastatin (CRESTOR) 10 MG tablet Take 1 tablet (10 mg total) by mouth daily. 90 tablet 3   sevelamer carbonate (RENVELA) 800 MG tablet Take 800 mg by mouth 3 (three) times daily with meals.     sodium bicarbonate 650 MG tablet Take 650 mg by mouth 3 (three) times daily.     No current facility-administered medications for this visit.     PHYSICAL EXAM: There were no vitals filed for this visit.  GENERAL: The patient is a well-nourished female, in no acute distress. The vital signs are documented above. Excellent thrill in her fistula.  Well-healed radiocephalic incision.  By physical her exam her fistula is moderate size and does empty into the antecubital vein and then cephalic vein above the elbow.  Duplex today shows that her fistula is in the 4 to 5 mm range.  MEDICAL ISSUES: Good Latoya Walsh maturation of her fistula now at approximately 4 to 5 weeks.  I discussed this with the patient.  Explained that she would need a total of 12 weeks before using her fistula.  I also explained that her size certainly is marginal.  Hopefully she will have continued enlargement with maturation and can have a  successful use of this fistula.  I will see her again in 4 to 6 weeks to determine this.  I also explained that we may have to abandon this and place a left upper arm brachiocephalic fistula.  We will see her again in 4 to 6 weeks for follow-up   Latoya Posner, MD [facility] [phone]  Note: Portions of this report may have been transcribed using voice recognition software.  Every effort has been made to ensure accuracy; however, inadvertent computerized transcription errors may still be present.

## 2021-01-02 ENCOUNTER — Encounter: Payer: Self-pay | Admitting: Family Medicine

## 2021-01-02 ENCOUNTER — Telehealth: Payer: Self-pay | Admitting: Family Medicine

## 2021-01-02 ENCOUNTER — Ambulatory Visit (INDEPENDENT_AMBULATORY_CARE_PROVIDER_SITE_OTHER): Payer: Medicaid Other | Admitting: Family Medicine

## 2021-01-02 VITALS — BP 128/70 | HR 80 | Resp 17 | Ht 67.0 in | Wt 174.0 lb

## 2021-01-02 DIAGNOSIS — N186 End stage renal disease: Secondary | ICD-10-CM | POA: Diagnosis not present

## 2021-01-02 DIAGNOSIS — E785 Hyperlipidemia, unspecified: Secondary | ICD-10-CM | POA: Diagnosis not present

## 2021-01-02 DIAGNOSIS — I509 Heart failure, unspecified: Secondary | ICD-10-CM

## 2021-01-02 DIAGNOSIS — F17218 Nicotine dependence, cigarettes, with other nicotine-induced disorders: Secondary | ICD-10-CM

## 2021-01-02 DIAGNOSIS — I10 Essential (primary) hypertension: Secondary | ICD-10-CM

## 2021-01-02 DIAGNOSIS — E1165 Type 2 diabetes mellitus with hyperglycemia: Secondary | ICD-10-CM | POA: Diagnosis not present

## 2021-01-02 NOTE — Telephone Encounter (Signed)
CNA came in Wanting to see what they could do to get more Aid hours for patient. Can call Kia ( caregiver) with further details.

## 2021-01-02 NOTE — Patient Instructions (Signed)
F/U in 4 months, call if you need me sooner  Please reconsider the fu vaccine, you need this  You need to speak with case worker about  being transferred to [facility] for dialysis, we will see if we can help with this  Thanks for choosing [facility], we consider it a privelige to serve you.

## 2021-01-06 ENCOUNTER — Encounter: Payer: Self-pay | Admitting: Family Medicine

## 2021-01-06 DIAGNOSIS — N186 End stage renal disease: Secondary | ICD-10-CM | POA: Insufficient documentation

## 2021-01-06 NOTE — Assessment & Plan Note (Signed)
Tolerating well, and requests transfer to [facility] facility

## 2021-01-06 NOTE — Assessment & Plan Note (Signed)
Followed by Endo, needs to keep doing so Ms. Beran is reminded of the importance of commitment to daily physical activity for 30 minutes or more, as able and the need to limit carbohydrate intake to 30 to 60 grams per meal to help with blood sugar control.   The need to take medication as prescribed, test blood sugar as directed, and to call between visits if there is a concern that blood sugar is uncontrolled is also discussed.   Ms. Thien is reminded of the importance of daily foot exam, annual eye examination, and good blood sugar, blood pressure and cholesterol control.  Diabetic Labs Latest Ref Rng & Units 11/26/2020 09/17/2020 09/17/2020 09/17/2020 09/17/2020  HbA1c 4.0 - 5.6 % - 8.5(A) 8.5(A) 8.5 8.5(A)  Microalbumin mg/dL - - - - -  Micro/Creat Ratio 0.0 - 30.0 mg/g - - - - -  Chol 100 - 199 mg/dL - - - - -  HDL >39 mg/dL - - - - -  Calc LDL 0 - 99 mg/dL - - - - -  Triglycerides 0 - 149 mg/dL - - - - -  Creatinine 0.44 - 1.00 mg/dL 4.30(H) - - - -   BP/Weight 01/02/2021 01/01/2021 12/25/2020 12/10/2020 11/26/2020 11/20/2020 [id number]  Systolic BP 0000000 123456 0000000 123XX123 Q000111Q 0000000 Q000111Q  Diastolic BP 70 72 70 65 77 76 74  Wt. (Lbs) 174 175 175 176.4 179.4 179.4 173  BMI 27.25 28.25 27.41 27.63 28.1 28.1 27.1   Foot/eye exam completion dates Latest Ref Rng & Units 09/17/2020 03/05/2020  Eye Exam No Retinopathy No Retinopathy -  Foot Form Completion - - Done

## 2021-01-06 NOTE — Assessment & Plan Note (Signed)
Currently asymptomatic an stable

## 2021-01-06 NOTE — Progress Notes (Signed)
Latoya Walsh     MRN: [id number]      DOB: 11-20-1960   HPI Latoya Walsh is here for follow up and re-evaluation of chronic medical conditions, medication management and review of any available recent lab and radiology data.  Preventive health is updated, specifically  Cancer screening and Immunization.   Questions or concerns regarding consultations or procedures which the PT has had in the interim are  addressed. The PT denies any adverse reactions to current medications since the last visit.  There are no new concerns.  There are no specific complaints   ROS Denies recent fever or chills. Denies sinus pressure, nasal congestion, ear pain or sore throat. Denies chest congestion, productive cough or wheezing. Denies chest pains, palpitations and leg swelling Denies abdominal pain, nausea, vomiting,diarrhea or constipation.   Denies dysuria, frequency, hesitancy or incontinence. C/o chronic  joint pain, swelling and limitation in mobility. Denies headaches, seizures, numbness, or tingling. Denies depression, anxiety or insomnia. Denies skin break down or rash.   PE  BP 128/70   Pulse 80   Resp 17   Ht '5\' 7"'$  (1.702 m)   Wt 174 lb (78.9 kg)   LMP 06/23/2010   SpO2 95%   BMI 27.25 kg/m   Patient alert and oriented and in no cardiopulmonary distress.  HEENT: No facial asymmetry, EOMI,     Neck supple .  Chest: Clear to auscultation bilaterally.  CVS: S1, S2 no murmurs, no S3.Regular rate.  ABD: Soft non tender.   Ext: No edema  MS: Adequate ROM spine, shoulders, hips and knees.  Skin: Intact, no ulcerations or rash noted.  Psych: Good eye contact, normal affect. Memory intact not anxious or depressed appearing.  CNS: CN 2-12 intact, power,  normal throughout.no focal deficits noted.   Assessment & Plan  Uncontrolled type 2 diabetes mellitus with hyperglycemia ([place]) Followed by Endo, needs to keep doing so Latoya Walsh is reminded of the importance of  commitment to daily physical activity for 30 minutes or more, as able and the need to limit carbohydrate intake to 30 to 60 grams per meal to help with blood sugar control.   The need to take medication as prescribed, test blood sugar as directed, and to call between visits if there is a concern that blood sugar is uncontrolled is also discussed.   Latoya Walsh is reminded of the importance of daily foot exam, annual eye examination, and good blood sugar, blood pressure and cholesterol control.  Diabetic Labs Latest Ref Rng & Units 11/26/2020 09/17/2020 09/17/2020 09/17/2020 09/17/2020  HbA1c 4.0 - 5.6 % - 8.5(A) 8.5(A) 8.5 8.5(A)  Microalbumin mg/dL - - - - -  Micro/Creat Ratio 0.0 - 30.0 mg/g - - - - -  Chol 100 - 199 mg/dL - - - - -  HDL >39 mg/dL - - - - -  Calc LDL 0 - 99 mg/dL - - - - -  Triglycerides 0 - 149 mg/dL - - - - -  Creatinine 0.44 - 1.00 mg/dL 4.30(H) - - - -   BP/Weight 01/02/2021 01/01/2021 12/25/2020 12/10/2020 11/26/2020 11/20/2020 123XX123  Systolic BP 0000000 123456 0000000 123XX123 Q000111Q 0000000 Q000111Q  Diastolic BP 70 72 70 65 77 76 74  Wt. (Lbs) 174 175 175 176.4 179.4 179.4 173  BMI 27.25 28.25 27.41 27.63 28.1 28.1 27.1   Foot/eye exam completion dates Latest Ref Rng & Units 09/17/2020 03/05/2020  Eye Exam No Retinopathy No Retinopathy -  Foot Form Completion - -  Done        Severe hypertension Controlled, no change in medication DASH diet and commitment to daily physical activity for a minimum of 30 minutes discussed and encouraged, as a part of hypertension management. The importance of attaining a healthy weight is also discussed.  BP/Weight 01/02/2021 01/01/2021 12/25/2020 12/10/2020 11/26/2020 11/20/2020 [id number]  Systolic BP 0000000 123456 0000000 123XX123 Q000111Q 0000000 Q000111Q  Diastolic BP 70 72 70 65 77 76 74  Wt. (Lbs) 174 175 175 176.4 179.4 179.4 173  BMI 27.25 28.25 27.41 27.63 28.1 28.1 27.1       Hyperlipidemia LDL goal <100 Hyperlipidemia:Low fat diet discussed and encouraged.   Lipid Panel   Lab Results  Component Value Date   CHOL 167 05/02/2020   HDL 34 (L) 05/02/2020   LDLCALC 87 05/02/2020   TRIG 277 (H) 05/02/2020   CHOLHDL 4.9 (H) 05/02/2020   Updated lab needed at/ before next visit. Not at goal    CHF (congestive heart failure) (HCC) Currently asymptomatic an stable  ESRD (end stage renal disease) ([place]) Tolerating well, and requests transfer to [facility] facility  Nicotine dependence Asked:confirms currently smokes cigarettes Assess: Unwilling to set a quit date Advise: needs to QUIT to reduce risk of cancer, cardio and cerebrovascular disease Assist: counseled for 5 minutes and literature provided Arrange: follow up in 2 to 4 months

## 2021-01-06 NOTE — Assessment & Plan Note (Signed)
Controlled, no change in medication DASH diet and commitment to daily physical activity for a minimum of 30 minutes discussed and encouraged, as a part of hypertension management. The importance of attaining a healthy weight is also discussed.  BP/Weight 01/02/2021 01/01/2021 12/25/2020 12/10/2020 11/26/2020 11/20/2020 [id number]  Systolic BP 0000000 123456 0000000 123XX123 Q000111Q 0000000 Q000111Q  Diastolic BP 70 72 70 65 77 76 74  Wt. (Lbs) 174 175 175 176.4 179.4 179.4 173  BMI 27.25 28.25 27.41 27.63 28.1 28.1 27.1

## 2021-01-06 NOTE — Assessment & Plan Note (Signed)
Hyperlipidemia:Low fat diet discussed and encouraged.   Lipid Panel  Lab Results  Component Value Date   CHOL 167 05/02/2020   HDL 34 (L) 05/02/2020   LDLCALC 87 05/02/2020   TRIG 277 (H) 05/02/2020   CHOLHDL 4.9 (H) 05/02/2020   Updated lab needed at/ before next visit. Not at goal

## 2021-01-06 NOTE — Assessment & Plan Note (Signed)
Asked:confirms currently smokes cigarettes Assess: Unwilling to set a quit date Advise: needs to QUIT to reduce risk of cancer, cardio and cerebrovascular disease Assist: counseled for 5 minutes and literature provided Arrange: follow up in 2 to 4 months  

## 2021-01-07 NOTE — Telephone Encounter (Signed)
Spoke with Latoya Walsh, told her we are sending a request to get 4hours a day 7days a week.

## 2021-01-07 NOTE — Telephone Encounter (Signed)
Spoke with Latoya Walsh pt's CNA she states that she receives 2 hours a day 7 days a week.  Pt is getting weak and having more SOB so she is needing more help around the house and the pt's son has to work.  CNA states pt can not lift things anymore so she needs help grocery shopping and those kind of things.  CNA states pt is worse on dialysis days.

## 2021-01-10 ENCOUNTER — Telehealth: Payer: Self-pay

## 2021-01-10 ENCOUNTER — Other Ambulatory Visit: Payer: Self-pay | Admitting: *Deleted

## 2021-01-10 ENCOUNTER — Other Ambulatory Visit: Payer: Self-pay

## 2021-01-10 NOTE — Patient Outreach (Addendum)
Medicaid Managed Care   Nurse Care Manager Note  01/10/2021 Name:  Latoya Walsh MRN:  [id number] DOB:  06/15/1960  Latoya Walsh is an 60 y.o. year old female who is a primary patient of [name], MD.  The [facility] Managed Care Coordination team was consulted for assistance with:    CHF DMII ESRD Ms. Chilcote was given information about Medicaid Managed Care Coordination team services today. Latoya Walsh Patient agreed to services and verbal consent obtained.  Engaged with patient by telephone for follow up visit in response to provider referral for case management and/or care coordination services.   Assessments/Interventions:  Review of past medical history, allergies, medications, health status, including review of consultants reports, laboratory and other test data, was performed as part of comprehensive evaluation and provision of chronic care management services.  SDOH (Social Determinants of Health) assessments and interventions performed: SDOH Interventions    Flowsheet Row Most Recent Value  SDOH Interventions   Food Insecurity Interventions Intervention Not Indicated  [Patient has ample food at this time.]  Transportation Interventions Intervention Not Indicated  [Patient's [place] or son will provide transportation]       Care Plan  Allergies  Allergen Reactions   Penicillins Itching and Rash    Medications Reviewed Today     Reviewed by Nahima Montane, RN (Registered Nurse) on 01/10/21 at [phone]  Med List Status: <None>   Medication Order Taking? Sig Documenting Provider Last Dose Status Informant  ACCU-CHEK GUIDE test strip [id number] No   Patient not taking: Reported on 01/10/2021   [provider] Not Taking Active Multiple Informants  Accu-Chek Softclix Lancets lancets [id number]  2 (two) times daily. [provider]  Active Multiple Informants  amLODipine (NORVASC) 5 MG tablet [id number] Yes TAKE ONE TABLET BY MOUTH ONCE  DAILY. [name], MD Taking Active Multiple Informants           Med Note Corky Walsh   Wed Dec 25, 2020  8:41 AM) Recent change to 10 mg on 12-18-20 ( not taking yet)  blood glucose meter kit and supplies [id number]  Dispense based on patient and insurance preference. Use to monitor glucose twice daily. Latoya Romp, NP  Active Multiple Informants  calcitRIOL (ROCALTROL) 0.25 MCG capsule [id number] Yes Take 0.5 mcg by mouth daily. [provider] Taking Active Multiple Informants           Med Note (Latoya Walsh A   Thu Dec 19, 2020  1:12 PM) 9/27-dose change to two tablets daily  furosemide (LASIX) 80 MG tablet [id number] Yes Take 80 mg  (1 tablet) in the morning and 40 mg (half tablet) in the afternoon. Latoya Lenis, MD Taking Active Multiple Informants  gabapentin (NEURONTIN) 100 MG capsule [id number] Yes Take 1 capsule (100 mg total) by mouth 3 (three) times daily. Latoya Larsson, NP Taking Active Multiple Informants  glipiZIDE (GLUCOTROL XL) 5 MG 24 hr tablet [id number] Yes TAKE ONE TABLET BY MOUTH EVERY MORNING WITH BREAKFAST  Patient taking differently: Take 5 mg by mouth daily with breakfast.   Latoya Spar, MD Taking Active   hydrALAZINE (APRESOLINE) 50 MG tablet [id number] Yes TAKE ONE TABLET BY MOUTH EVERY 8 HOURS  Patient taking differently: Take 50 mg by mouth 3 (three) times daily.   Latoya Spar, MD Taking Active   insulin glargine (LANTUS SOLOSTAR) 100 UNIT/ML Solostar Pen [id number] No Inject 10 Units into the skin at bedtime.  Patient  not taking: Reported on 01/10/2021   Latoya Romp, NP Not Taking Active Multiple Informants  Insulin Pen Needle (PEN NEEDLES) 31G X 5 MM MISC [id number] Yes Use to inject insulin once daily Latoya Romp, NP Taking Active Multiple Informants  isosorbide mononitrate (IMDUR) 30 MG 24 hr tablet [id number]  Take 1 tablet (30 mg total) by mouth daily. Latoya Lenis, MD  Expired 12/25/20 2359 Multiple  Informants           Med Note Garlan Fillers, SHERRY Walsh   Thu Jan 02, 2021 10:26 AM) Needs new rx  metoprolol succinate (TOPROL-XL) 50 MG 24 hr tablet [id number]  Take 1 tablet (50 mg total) by mouth daily. Take with or immediately following a meal. Latoya Lenis, MD  Expired 12/25/20 2359 Multiple Informants           Med Note Garlan Fillers, SHERRY Walsh   Thu Jan 02, 2021 10:26 AM) Needs new rx  mirtazapine (REMERON) 15 MG tablet [id number] Yes TAKE (1) TABLET BY MOUTH AT BEDTIME.  Patient taking differently: Take 15 mg by mouth at bedtime.   [name], MD Taking Active   oxyCODONE-acetaminophen (PERCOCET) 5-325 MG tablet [id number] No Take 1 tablet by mouth every 6 (six) hours as needed for severe pain.  Patient not taking: Reported on 01/10/2021   Latoya Posner, MD Not Taking Active            Med Note Latoya Walsh   Thu Jan 02, 2021 10:26 AM) Need refill  oxyCODONE-acetaminophen (PERCOCET) 5-325 MG tablet [id number] No Take 1 tablet by mouth every 6 (six) hours as needed for severe pain.  Patient not taking: No sig reported   Early, Latoya Meres, MD Not Taking Active   rosuvastatin (CRESTOR) 10 MG tablet [id number] Yes Take 1 tablet (10 mg total) by mouth daily. Latoya Lenis, MD Taking Active Multiple Informants  sevelamer carbonate (RENVELA) 800 MG tablet [id number] Yes Take 800 mg by mouth 3 (three) times daily with meals. [provider] Taking Active Multiple Informants           Med Note (Chisum Habenicht A   Thu Dec 19, 2020  1:13 PM) This medication to be delivered today 12/19/20  sodium bicarbonate 650 MG tablet [id number] Yes Take 650 mg by mouth 3 (three) times daily. [provider] Taking Active Multiple Informants  Med List Note Latoya Walsh, [place] [birth year] 7001):              Patient Active Problem List   Diagnosis Date Noted   ESRD (end stage renal disease) ([place]) 01/06/2021   CHF (congestive heart failure) ([place]) 06/06/2020   Leg swelling  04/28/2020   Hyperlipidemia LDL goal <100 04/28/2020   Left hand pain 03/10/2020   Anemia 02/19/2020   Depression, major, single episode, in partial remission ([place]) 01/25/2019   Headache 11/13/2018   Insomnia 09/25/2018   Screening for colorectal cancer 07/23/2016   Lumbar back pain with radiculopathy affecting right lower extremity 03/27/2015   Non compliance with medical treatment 07/22/2014   Chronic hepatitis C without hepatic coma ([place]) 12/04/2013   Allergic rhinitis 11/03/2012   Severe hypertension 06/25/2011   Anxiety and depression 02/05/2011   Nicotine dependence 11/01/2010   Drug dependence, continuous abuse ([place]) 03/22/2010   Uncontrolled type 2 diabetes mellitus with hyperglycemia ([place]) 06/02/2007   Alcohol abuse 06/02/2007    Conditions to be addressed/monitored per PCP order:  CHF, DMII, and ESRD  Care Plan :  Heart Failure (Adult)  Updates made by Versa Montane, RN since 01/10/2021 12:00 AM     Problem: Symptom Exacerbation (Heart Failure)      Long-Range Goal: Symptom Exacerbation Prevented or Minimized   Start Date: 08/09/2020  Expected End Date: 02/04/2021  Recent Progress: On track  Priority: High  Note:   Current Barriers:  Knowledge deficit related to basic heart failure pathophysiology and self care management-Ms. Camerer is managing her health with the assistance of an Aide. Today patient was alert, but confused at times and requested RNCM to speak with her Aide. Ms. Glace was not at home and was unable to review her medications. She has recently began receiving Paramedic support for medication management. [name] is unsure of medications. She is taking her "bubble packs" 3 times a day and is part of the Paramedicine program. [name] comes out every Friday and checks BP and BS. Ms. Olivar continues to feeling "sleepy" after taking her medications.  Ms. Dusing did receive a scale, but is not weighing daily. She is trying to get some exercise by walking up and  down the road, but reports back pain afterward. Ms. Divelbiss is aware of local food pantries and plans to go later today. Ms. Brodzinski reports improvement in the way she feels during the day since taking toprol at night instead of during the day. She has cut back on smoking to 1/2 a cigarette after meals. Ms. Choi reports taking all medications as instructed. She does not have an appetite. Patient is ready to start dialysis.-Update-Ms. Klinck has dialysis T/TH/S, she will transfer to [facility] in [place] next week. Ms. Margerum is excited, because her twin sister is at this location. She reports feeling tired and would like more PCS hours. Currently she recieves 2hrs/7 days a week. Knowledge Deficits related to heart failure medications Patient does not have readable scale Literacy Barriers Financial strain Cognitive Deficits Unable to self administer medications as prescribed Does not adhere to provider recommendations re:  Does not contact provider office for questions/concerns Case Manager Clinical Goal(s):  patient will weigh self daily and record patient will verbalize understanding of Heart Failure Action Plan and when to call doctor patient will take all Heart Failure mediations as prescribed patient will weigh daily and record (notifying MD of 3 lb weight gain over night or 5 lb in a week) Interventions:   Reviewed upcoming appointments: Dialysis T/TH/S, 11/10 with Cardiology and 11/16 with [facility]-patient has transportation to all upcoming appointments Collaborated with PCP re: patient requesting increased PCS hours, provided [employer] information-provider to call and request increased hours Provided encouragement Collaborated with PCS Aide re: diet and medications  Provided written information on diabetic and dash diet Provided therapeutic listening Advised patient take all medications as instructed Patient Goals/Self-Care Activities - work on cutting out smoking completely - attend  scheduled appointments - replace chips with a healthy snack like nuts - eat more whole grains, fruits and vegetables, lean meats and healthy fats - know when to call the doctor - track symptoms and what helps feel better or worse - dress right for the weather, hot or cold  - call office if I gain more than 2 pounds in one day or 5 pounds in one week - use salt in moderation - watch for swelling in feet, ankles and legs every day - weigh myself daily  Follow Up Plan: Telephone follow up appointment with care management team member scheduled for:01/24/21 @ 1:15pm     Care Plan :  Diabetes Type 2 (Adult)  Updates made by Maliya Montane, RN since 01/10/2021 12:00 AM     Problem: Glycemic Management (Diabetes, Type 2)      Long-Range Goal: Glycemic Management Optimized   Start Date: 08/09/2020  Expected End Date: 02/04/2021  Recent Progress: On track  Priority: Medium  Note:   Objective:  Lab Results  Component Value Date   HGBA1C 8.5 (A) 09/17/2020   HGBA1C 8.5 09/17/2020   HGBA1C 8.5 (A) 09/17/2020   HGBA1C 8.5 (A) 09/17/2020   Lab Results  Component Value Date   CREATININE 4.30 (H) 11/26/2020   CREATININE 2.81 (H) 06/10/2020   CREATININE 2.53 (H) 06/09/2020   No results found for: EGFR Current Barriers:  Knowledge Deficits related to basic Diabetes pathophysiology and self care/management-RNCM reviewed note in patients chart regarding elevated blood sugars and PCP would like for patient to be seen this week. Patient reports checking her BS daily, yesterday reading was 202. Patient with decreased appetite and unclear about what she is eating. Patient was evaluated and referral to Endocrinology was placed. Ms. Mulkern has started attending a bible study on Monday and Thursday evenings. Ms. Trautmann has not been checking her BS, due to not having test strips. She had surgery for fistula creation on 9/6 and reports doing fine. She continues to have Paramedic, [name] come out and assist  with medication management. Ms. Arentz continues to not have any test strips to monitor blood sugar. Patient with decreased appetite. Ms. Lady now has correct test strips, thanks to [name]. [name] instructed patient on correct strips. Patient is not checking her blood sugar and states "my mind is not on checking my blood sugar." She is more concerned about starting dialysis in the next two weeks. She does continue to participate in bible study on Thursday nights and enjoys this connection.-Update-Ms. Giusto has dialysis T/TH/S, she has not been checking her BS, but feels like her BS is high in the mornings. She is not taking bedtime insulin, feels she doesn't need it.  She is aware of all upcoming appointments and has transportation.  Knowledge Deficits related to medications used for management of diabetes Difficulty obtaining or cannot afford medications Does not have glucometer to monitor blood sugar Financial Constraints Cognitive Deficits Unable to self administer medications as prescribed Does not contact provider office for questions/concerns Case Manager Clinical Goal(s):  patient will demonstrate improved adherence to prescribed treatment plan for diabetes self care/management as evidenced by: daily monitoring and recording of CBG,  adherence to ADA/ carb modified diet, adherence to prescribed medication regimen, contacting provider for new or worsened symptoms or questions.   Interventions:  Inter-disciplinary care team collaboration (see longitudinal plan of care) Provided education to patient about basic DM disease process Discussed plans with patient for ongoing care management follow up and provided patient with direct contact information for care management team Provided patient with written educational materials related to diabetic nutrition, dialysis and heart failure Reviewed upcoming appointments: Dialysis T/TH/S, 11/10 with Cardiology and 11/16 with [facility]-patient  has transportation to all upcoming appointments Review of patient status, including review of consultants reports, relevant laboratory and other test results, and medications completed. Discussed with [place] Aide the importance of Ms. Crill eating 3 small meals a day, including protein and vegetables  Will follow closely with frequent telephone visits Discussed with patient and [place] the importance of taking all medications as directed, including insulin Self-Care Activities - Self administers oral medications as prescribed Attends all scheduled  provider appointments Checks blood sugars as prescribed and utilize hyper and hypoglycemia protocol as needed Adheres to prescribed ADA/carb modified Patient Goals: - take your current glucometer to the Pharmacy to obtain accurate test strips - obtain appropriate test strips and check your blood sugar before breakfast and at bedtime daily - attend appointment on Dialysis T/TH/S, 11/10 with Cardiology and 11/16 with [facility]-patient has transportation to all upcoming appointments - adhere to a diabetic diet, eat three meals a day, including protein with each meal - check blood sugar at prescribed times - check blood sugar if I feel it is too high or too low - call your provider with abnormal readings - enter blood sugar readings and medication or insulin into daily log - take the blood sugar log to all doctor visits - take the blood sugar meter to all doctor visits  - increase exercise to 30 min a day/5 days a week Follow Up Plan: Telephone follow up appointment with care management team member scheduled for:01/24/21 @ 1:15p      Follow Up:  Patient agrees to Care Plan and Follow-up.  Plan: The Managed Medicaid care management team will reach out to the patient again over the next 14 days.  Date/time of next scheduled RN care management/care coordination outreach:  01/24/21 @ 1:15pm  [name] RN, [place] RN  Care Coordinator

## 2021-01-10 NOTE — Patient Instructions (Signed)
Visit Information  Ms. Heimann was given information about Medicaid Managed Care team care coordination services as a part of their [place] Medicaid benefit. [name] verbally consented to engagement with the [facility] Managed Care team.   If you are experiencing a medical emergency, please call 911 or report to your local emergency department or urgent care.   If you have a non-emergency medical problem during routine business hours, please contact your provider's office and ask to speak with a nurse.   For questions related to your [facility], please call: [phone] or visit the homepage here: [email or web address]  If you would like to schedule transportation through your [facility], please call the following number at least 2 days in advance of your appointment: [phone].   Call the [place] at [phone], at any time, 24 hours a day, 7 days a week. If you are in danger or need immediate medical attention call 911.  If you would like help to quit smoking, call 1-800-QUIT-NOW (564) 219-2668) OR Espaol: 1-855-Djelo-Ya (8-032-122-4825) o para ms informacin haga clic aqu or Text READY to 200-400 to register via text  Ms. [name] - following are the goals we discussed in your visit today:   Goals Addressed             This Visit's Progress    Monitor and Manage My Blood Sugar-Diabetes Type 2       Timeframe:  Long-Range Goal Priority:  Medium Start Date:  08/09/20                           Expected End Date:  ongoing                     Follow Up Date  01/24/21   - take your current glucometer to the Pharmacy to obtain accurate test strips - obtain appropriate test strips and check your blood sugar before breakfast and at bedtime daily - attend appointment on Dialysis T/TH/S, 11/10 with Cardiology and 11/16 with  [facility]-patient has transportation to all upcoming appointments - adhere to a diabetic diet, eat three meals a day, including protein with each meal - check blood sugar if I feel it is too high or too low - call your provider with abnormal readings - enter blood sugar readings and medication or insulin into daily log - take the blood sugar log to all doctor visits - take the blood sugar meter to all doctor visits  - increase exercise to 30 min a day/5 days a week   Why is this important?   Checking your blood sugar at home helps to keep it from getting very high or very low.  Writing the results in a diary or log helps the doctor know how to care for you.  Your blood sugar log should have the time, date and the results.  Also, write down the amount of insulin or other medicine that you take.  Other information, like what you ate, exercise done and how you were feeling, will also be helpful.          Track and Manage Fluids and Swelling-Heart Failure       Timeframe:  Long-Range Goal Priority:  High Start Date:    08/09/20  Expected End Date:    ongoing                   Follow Up Date 01/24/21   - weigh at the same time each day - call office if I gain more than 2 pounds in one day or 5 pounds in one week - use salt in moderation - watch for swelling in feet, ankles and legs every day - call provider with any concerns or questions     Why is this important?   It is important to check your weight daily and watch how much salt and liquids you have.  It will help you to manage your heart failure.         Track and Manage Symptoms-Heart Failure       Timeframe:  Long-Range Goal Priority:  High Start Date:   08/09/20                          Expected End Date:    ongoing                  Follow Up Date 01/24/21    - work on cutting out smoking completely - replace chips with a healthy snack like nuts - eat more whole grains, fruits and vegetables, lean meats  and healthy fats - know when to call the doctor - track symptoms and what helps feel better or worse - dress right for the weather, hot or cold    Why is this important?   You will be able to handle your symptoms better if you keep track of them.  Making some simple changes to your lifestyle will help.  Eating healthy is one thing you can do to take good care of yourself.            Please see education materials related to dialysis, heart failure and diabetes provided as print materials.   The patient verbalized understanding of instructions provided today and agreed to receive a mailed copy of patient instruction and/or educational materials.  Telephone follow up appointment with Managed Medicaid care management team member scheduled for:01/24/21 @ 1:15pm  [name] RN, BSN [place] RN Care Coordinator   Following is a copy of your plan of care:  Patient Care Plan: Heart Failure (Adult)     Problem Identified: Symptom Exacerbation (Heart Failure)      Long-Range Goal: Symptom Exacerbation Prevented or Minimized   Start Date: 08/09/2020  Expected End Date: 02/04/2021  Recent Progress: On track  Priority: High  Note:   Current Barriers:  Knowledge deficit related to basic heart failure pathophysiology and self care management-Ms. Brazil is managing her health with the assistance of an Aide. Today patient was alert, but confused at times and requested RNCM to speak with her Aide. Ms. Anton was not at home and was unable to review her medications. She has recently began receiving Paramedic support for medication management. [name] is unsure of medications. She is taking her "bubble packs" 3 times a day and is part of the Paramedicine program. [name] comes out every Friday and checks BP and BS. Ms. Ard continues to feeling "sleepy" after taking her medications.  Ms. Virrueta did receive a scale, but is not weighing daily. She is trying to get  some exercise by walking up and down the road, but reports back pain afterward. Ms. Calzada is aware of local food pantries  and plans to go later today. Ms. Audi reports improvement in the way she feels during the day since taking toprol at night instead of during the day. She has cut back on smoking to 1/2 a cigarette after meals. Ms. Alarid reports taking all medications as instructed. She does not have an appetite. Patient is ready to start dialysis.-Update-Ms. Valko has dialysis T/TH/S, she will transfer to [facility] in [place] next week. Ms. Wilhelmsen is excited, because her twin sister is at this location. She reports feeling tired and would like more PCS hours. Currently she recieves 2hrs/7 days a week. Knowledge Deficits related to heart failure medications Patient does not have readable scale Literacy Barriers Financial strain Cognitive Deficits Unable to self administer medications as prescribed Does not adhere to provider recommendations re:  Does not contact provider office for questions/concerns Case Manager Clinical Goal(s):  patient will weigh self daily and record patient will verbalize understanding of Heart Failure Action Plan and when to call doctor patient will take all Heart Failure mediations as prescribed patient will weigh daily and record (notifying MD of 3 lb weight gain over night or 5 lb in a week) Interventions:   Reviewed upcoming appointments: Dialysis T/TH/S, 11/10 with Cardiology and 11/16 with [facility]-patient has transportation to all upcoming appointments Collaborated with PCP re: patient requesting increased PCS hours, provided [employer] information-provider to call and request increased hours Provided encouragement Collaborated with PCS Aide re: diet and medications  Provided written information on diabetic and dash diet Provided therapeutic listening Advised patient take all medications as instructed Patient Goals/Self-Care Activities - work on cutting out  smoking completely - attend scheduled appointments - replace chips with a healthy snack like nuts - eat more whole grains, fruits and vegetables, lean meats and healthy fats - know when to call the doctor - track symptoms and what helps feel better or worse - dress right for the weather, hot or cold  - call office if I gain more than 2 pounds in one day or 5 pounds in one week - use salt in moderation - watch for swelling in feet, ankles and legs every day - weigh myself daily  Follow Up Plan: Telephone follow up appointment with care management team member scheduled for:01/24/21 @ 1:15pm     Patient Care Plan: Diabetes Type 2 (Adult)     Problem Identified: Glycemic Management (Diabetes, Type 2)      Long-Range Goal: Glycemic Management Optimized   Start Date: 08/09/2020  Expected End Date: 02/04/2021  Recent Progress: On track  Priority: Medium  Note:   Objective:  Lab Results  Component Value Date   HGBA1C 8.5 (A) 09/17/2020   HGBA1C 8.5 09/17/2020   HGBA1C 8.5 (A) 09/17/2020   HGBA1C 8.5 (A) 09/17/2020   Lab Results  Component Value Date   CREATININE 4.30 (H) 11/26/2020   CREATININE 2.81 (H) 06/10/2020   CREATININE 2.53 (H) 06/09/2020   No results found for: EGFR Current Barriers:  Knowledge Deficits related to basic Diabetes pathophysiology and self care/management-RNCM reviewed note in patients chart regarding elevated blood sugars and PCP would like for patient to be seen this week. Patient reports checking her BS daily, yesterday reading was 202. Patient with decreased appetite and unclear about what she is eating. Patient was evaluated and referral to Endocrinology was placed. Ms. Meadow has started attending a bible study on Monday and Thursday evenings. Ms. Overbaugh has not been checking her BS, due to not having test strips. 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 She continues to have Paramedic, [name] come out and assist with medication  management. Ms. Kosel continues to not have any test strips to monitor blood sugar. Patient with decreased appetite. Ms. Horsman now has correct test strips, thanks to [name]. [name] instructed patient on correct strips. Patient is not checking her blood sugar and states "my mind is not on checking my blood sugar." She is more concerned about starting dialysis in the next two weeks. She does continue to participate in bible study on Thursday nights and enjoys this connection.-Update-Ms. Canales has dialysis T/TH/S, she has not been checking her BS, but feels like her BS is high in the mornings. She is not taking bedtime insulin, feels she doesn't need it.  She is aware of all upcoming appointments and has transportation.  Knowledge Deficits related to medications used for management of diabetes Difficulty obtaining or cannot afford medications Does not have glucometer to monitor blood sugar Financial Constraints Cognitive Deficits Unable to self administer medications as prescribed Does not contact provider office for questions/concerns Case Manager Clinical Goal(s):  patient will demonstrate improved adherence to prescribed treatment plan for diabetes self care/management as evidenced by: daily monitoring and recording of CBG,  adherence to ADA/ carb modified diet, adherence to prescribed medication regimen, contacting provider for new or worsened symptoms or questions.   Interventions:  Inter-disciplinary care team collaboration (see longitudinal plan of care) Provided education to patient about basic DM disease process Discussed plans with patient for ongoing care management follow up and provided patient with direct contact information for care management team Provided patient with written educational materials related to diabetic nutrition, dialysis and heart failure Reviewed upcoming appointments: Dialysis T/TH/S, 11/10 with Cardiology and 11/16 with [facility]-patient has transportation  to all upcoming appointments Review of patient status, including review of consultants reports, relevant laboratory and other test results, and medications completed. Discussed with [place] Aide the importance of Ms. Laplant eating 3 small meals a day, including protein and vegetables  Will follow closely with frequent telephone visits Self-Care Activities - Self administers oral medications as prescribed Attends all scheduled provider appointments Checks blood sugars as prescribed and utilize hyper and hypoglycemia protocol as needed Adheres to prescribed ADA/carb modified Patient Goals: - take your current glucometer to the Pharmacy to obtain accurate test strips - obtain appropriate test strips and check your blood sugar before breakfast and at bedtime daily - attend appointment on Dialysis T/TH/S, 11/10 with Cardiology and 11/16 with [facility]-patient has transportation to all upcoming appointments - adhere to a diabetic diet, eat three meals a day, including protein with each meal - check blood sugar at prescribed times - check blood sugar if I feel it is too high or too low - call your provider with abnormal readings - enter blood sugar readings and medication or insulin into daily log - take the blood sugar log to all doctor visits - take the blood sugar meter to all doctor visits  - increase exercise to 30 min a day/5 days a week Follow Up Plan: Telephone follow up appointment with care management team member scheduled for:01/24/21 @ 1:15p     Patient Care Plan: Medication management  Completed 12/16/2020   Problem Identified: Health Promotion or Disease Self-Management (General Plan of Care) Resolved 12/16/2020     Goal: Medication Management Completed 12/16/2020  Note:   Current Barriers:  Unable to self administer medications as prescribed Does not adhere to prescribed medication regimen Does  not maintain contact with provider office Does not contact provider office for  questions/concerns   Pharmacist Clinical Goal(s):  Over the next 30 days, patient will contact provider office for questions/concerns as evidenced notation of same in electronic health record through collaboration with PharmD and provider.    Interventions: Inter-disciplinary care team collaboration (see longitudinal plan of care) Comprehensive medication review performed; medication list updated in electronic medical record    Patient Goals/Self-Care Activities Over the next 30 days, patient will:  - collaborate with provider on medication access solutions  Follow Up Plan: The care management team will reach out to the patient again over the next 30 days.       Patient Care Plan: General Pharmacy (Adult)     Problem Identified: Chronic Disease Management   Priority: High     Long-Range Goal: Managing chronic disease therapies   Start Date: 12/13/2020  Expected End Date: 03/16/2021  This Visit's Progress: On track  Priority: High  Note:   Current Barriers:  Unable to achieve control of type 2 diabetes  Suboptimal therapeutic regimen for type 2 diabetes   Pharmacist Clinical Goal(s):  patient will achieve control of diabetes as evidenced by home blood glucose readings and A1C within goals maintain control of blood pressure as evidenced by continued office readings within goal  through collaboration with PharmD and provider.    Interventions: Inter-disciplinary care team collaboration (see longitudinal plan of care) Comprehensive medication review performed; medication list updated in electronic medical record  Diabetes:  Uncontrolled; current treatment: glipizide 53m, insulin glargine (Lantus) 10 units at bedtime;   Current glucose readings: fasting glucose: 169 this morning; reports sometimes 200's, post prandial glucose: 300's  Denies hypoglycemic/hyperglycemic symptoms  Current meal patterns:  Breakfast: skips breakfast and lunch Dinner: pizza Snacks: peanuts  or fruit Dessert: jello (sugar free) Drinks: water, apple juice bottle 3x/day  Current exercise: denies  Educated on lifestyle modifcations Recommended patient follow-up with PCP to discuss further management of diabetes Collaborated with PCP to recommend therapy changes  Hypertension:  Controlled based on last office reading; current treatment: amlodipine 522m furosemide 8087mn the morning and 1/2 tablet in the afternoon, hydralazine 43m62msosorbide mononitrate 30mg75mtoprolol succinate 43mg;58murrent home readings: patient does not have blood pressure monitor  Denies hypotensive/hypertensive symptoms  Recommended patient continue to take medications and discussed she may also check blood pressure at local pharmacies for free with machines  Patient Goals/Self-Care Activities Over the next 90 days, patient will:  - take medications as prescribed check glucose 2x/day, document, and provide at future appointments target a minimum of 150 minutes of moderate intensity exercise weekly engage in dietary modifications by decreasing carbohydrate intake  Follow Up Plan: Telephone follow up appointment with care management team member scheduled for: Care Manager 12/19/20 PharmD 02/11/21

## 2021-01-10 NOTE — Telephone Encounter (Signed)
PCS form - beneficiary enrolled in a managed care health plan, contact pcs  Copied Noted Sleeved

## 2021-01-13 ENCOUNTER — Telehealth: Payer: Self-pay | Admitting: Licensed Clinical Social Worker

## 2021-01-13 NOTE — Telephone Encounter (Signed)
LCSW reviewed notes from [facility] call- noted that [facility] documented pt is going to [employer]. I reached out to [facility], paramedic, who is not aware of this plan but shares that pt twin lives at [place] so perhaps she is moving there. LCSW attempted to reach pt at cell number listed [phone]. Pt son answered, shares pt is at dialysis currently. Confirmed home number is in fact pt cell number. Will reattempt to clarify tomorrow when she does not have dialysis.   Westley Hummer, MSW, [place]  [phone]

## 2021-01-14 ENCOUNTER — Telehealth: Payer: Self-pay | Admitting: Licensed Clinical Social Worker

## 2021-01-14 NOTE — Telephone Encounter (Signed)
LCSW reached out to pt this afternoon at [phone]. Was able to reach her at that number. She shares that she is NOT moving to [facility] but rather was able to move her dialysis chair to the [facility] on [address]. Her twin sister lives at [place] [facility] [place] at the same clinic on [address] while she is scheduled to dialyze on MWF. She went for treatment yesterday, states she feels fine and cant really notice any noted difference at this time. She is continuing to take her bubble packs and denies any challenges at this time.  I will f/u with pt prior to upcoming [facility] appt  on 11/10 to ensure she has a ride.   Westley Hummer, MSW, [place]  [phone]

## 2021-01-20 DIAGNOSIS — E1122 Type 2 diabetes mellitus with diabetic chronic kidney disease: Secondary | ICD-10-CM | POA: Diagnosis not present

## 2021-01-20 DIAGNOSIS — N186 End stage renal disease: Secondary | ICD-10-CM | POA: Diagnosis not present

## 2021-01-20 DIAGNOSIS — Z992 Dependence on renal dialysis: Secondary | ICD-10-CM | POA: Diagnosis not present

## 2021-01-24 ENCOUNTER — Other Ambulatory Visit: Payer: Self-pay

## 2021-01-24 ENCOUNTER — Telehealth: Payer: Self-pay | Admitting: Licensed Clinical Social Worker

## 2021-01-24 ENCOUNTER — Telehealth: Payer: Self-pay | Admitting: Nurse Practitioner

## 2021-01-24 ENCOUNTER — Other Ambulatory Visit: Payer: Self-pay | Admitting: *Deleted

## 2021-01-24 NOTE — Telephone Encounter (Signed)
[name] is calling from [facility] that helps with patient, to let us know she is on dialysis now and states she does not always take her night time insulin medicine and she is sometimes sweaty. Requesting a call back [phone]

## 2021-01-24 NOTE — Telephone Encounter (Signed)
Stop Glipizide, keep the Lantus at 10 units nightly.  Encourage patient to be consistent monitoring blood glucose, I cannot make changes without seeing consistent readings.

## 2021-01-24 NOTE — Patient Outreach (Signed)
Care Coordination  01/24/2021  Latoya Walsh 06-10-1960 [id number]  Successful outreach with [name] today. She is currently at dialysis and unable to talk today. Patient denies any needs today. RNCM will reach out again on 01/28/21 @ 9:30am. Patient agreed to this date and time.  [name] RN, BSN [place]  [employer] manager

## 2021-01-24 NOTE — Telephone Encounter (Signed)
[employer] and gave her the message from [place] as patient is currently at dialysis. [name] will advise the patient and will encourage to check blood glucose and will contact us next week. [name] verbalized an understanding.

## 2021-01-24 NOTE — Telephone Encounter (Signed)
LCSW received an update from [facility] who shares that she has gone out to pt home for visit and there is continued challenge w/ compliance around medication and diet. She was able to assist pt but wonders if mental health support may be a good idea for pt. LCSW shared that [facility] may have some social work support that can regularly check in with pt, Delrae Alfred may also be an option if pt open. [name] agrees, I requested she reach out to PCP with the above concerns and I will route this to [facility] that has previously spoken with pt to see if their team has any more to offer. LCSW will f/u with pt too about potential information about [facility].   Westley Hummer, MSW, [place]  [phone]

## 2021-01-27 ENCOUNTER — Telehealth: Payer: Self-pay | Admitting: Licensed Clinical Social Worker

## 2021-01-27 NOTE — Telephone Encounter (Signed)
LCSW reached out to pt this morning, she shares she is at dialysis and doing well. She is aware after [name]'s visit the importance of her nighttime insulin and need to note readings. She shares that she cleaned up her home this weekend, states she has back pain if standing/moving for too long but did it slowly. She shares that even with all the changes that she thinks she is doing fine. Pt aware that both [facility] and I are able to connect her with mental health supports if she is interested in such for additional support. At this time she thinks she is doing alright. She confirms she has ride to her appt w/ Dr. Donnetta Hutching next week at [facility]. Encouraged her to contact [place] or I if that changes.   Westley Hummer, MSW, [place]  [phone]

## 2021-01-28 ENCOUNTER — Other Ambulatory Visit: Payer: Self-pay | Admitting: *Deleted

## 2021-01-28 NOTE — Patient Outreach (Signed)
Care Coordination  01/28/2021  MARGARETMARY PRISK [birth year] [id number]   Medicaid Managed Care   Unsuccessful Outreach Note  01/28/2021 Name: Latoya Walsh MRN: [id number] DOB: [birth year]  Referred by: [name], MD Reason for referral : High Risk Managed Medicaid (Unsuccessful RNCM follow up telephone outreach)   An unsuccessful telephone outreach was attempted today. The patient was referred to the case management team for assistance with care management and care coordination.   Follow Up Plan: The care management team will reach out to the patient again over the next 7 days.   [name] RN, BSN   [employer] manager

## 2021-01-28 NOTE — Patient Instructions (Signed)
Visit Information  Ms. [name]  - as a part of your Medicaid benefit, you are eligible for care management and care coordination services at no cost or copay. I was unable to reach you by phone today but would be happy to help you with your health related needs. Please feel free to call me @ [phone].   A member of the Managed Medicaid care management team will reach out to you again over the next 7 days.   [name] RN, BSN [place]  [employer] manager

## 2021-01-30 ENCOUNTER — Ambulatory Visit: Payer: Medicaid Other | Admitting: Cardiology

## 2021-02-04 ENCOUNTER — Telehealth: Payer: Self-pay | Admitting: Family Medicine

## 2021-02-04 NOTE — Telephone Encounter (Signed)
CAP forms   Noted  Copied Sleeved

## 2021-02-05 ENCOUNTER — Ambulatory Visit (INDEPENDENT_AMBULATORY_CARE_PROVIDER_SITE_OTHER): Payer: Medicaid Other | Admitting: Vascular Surgery

## 2021-02-05 ENCOUNTER — Encounter: Payer: Self-pay | Admitting: Vascular Surgery

## 2021-02-05 ENCOUNTER — Other Ambulatory Visit: Payer: Self-pay

## 2021-02-05 VITALS — BP 149/71 | HR 101 | Temp 97.9°F | Resp 18 | Ht 67.0 in | Wt 174.6 lb

## 2021-02-05 DIAGNOSIS — Z992 Dependence on renal dialysis: Secondary | ICD-10-CM

## 2021-02-05 DIAGNOSIS — N186 End stage renal disease: Secondary | ICD-10-CM

## 2021-02-05 NOTE — Progress Notes (Signed)
[facility] [place]  Patient name: Latoya Walsh MRN: [id number] DOB: 1960/10/14 Sex: female  REASON FOR VISIT: Follow-up left AV fistula creation  HPI: Latoya Walsh is a 60 y.o. female here today for follow-up.  She underwent left radiocephalic fistula creation by myself at [facility] on 11/26/2020.  She currently undergoes hemodialysis via right IJ tunneled catheter.  She has multiple complaints.  She does report some tenderness over the radiocephalic incision and also reports tenderness over the vein to palpation.  She has a very poor understanding of her medical condition.  Current Outpatient Medications  Medication Sig Dispense Refill   Accu-Chek Softclix Lancets lancets 2 (two) times daily.     amLODipine (NORVASC) 5 MG tablet TAKE ONE TABLET BY MOUTH ONCE DAILY. 30 tablet 0   blood glucose meter kit and supplies Dispense based on patient and insurance preference. Use to monitor glucose twice daily. 1 each 0   calcitRIOL (ROCALTROL) 0.25 MCG capsule Take 0.5 mcg by mouth daily.     furosemide (LASIX) 80 MG tablet Take 80 mg  (1 tablet) in the morning and 40 mg (half tablet) in the afternoon. 135 tablet 3   gabapentin (NEURONTIN) 100 MG capsule Take 1 capsule (100 mg total) by mouth 3 (three) times daily. 90 capsule 3   glipiZIDE (GLUCOTROL XL) 5 MG 24 hr tablet TAKE ONE TABLET BY MOUTH EVERY MORNING WITH BREAKFAST (Patient taking differently: Take 5 mg by mouth daily with breakfast.) 90 tablet 0   hydrALAZINE (APRESOLINE) 50 MG tablet TAKE ONE TABLET BY MOUTH EVERY 8 HOURS (Patient taking differently: Take 50 mg by mouth 3 (three) times daily.) 90 tablet 3   insulin glargine (LANTUS SOLOSTAR) 100 UNIT/ML Solostar Pen Inject 10 Units into the skin at bedtime. 15 mL 1   Insulin Pen Needle (PEN NEEDLES) 31G X 5 MM MISC Use to inject insulin once daily 90 each 3   mirtazapine (REMERON) 15 MG tablet TAKE (1) TABLET BY  MOUTH AT BEDTIME. (Patient taking differently: Take 15 mg by mouth at bedtime.) 30 tablet 0   rosuvastatin (CRESTOR) 10 MG tablet Take 1 tablet (10 mg total) by mouth daily. 90 tablet 3   sevelamer carbonate (RENVELA) 800 MG tablet Take 800 mg by mouth 3 (three) times daily with meals.     sodium bicarbonate 650 MG tablet Take 650 mg by mouth 3 (three) times daily.     ACCU-CHEK GUIDE test strip  (Patient not taking: No sig reported)     isosorbide mononitrate (IMDUR) 30 MG 24 hr tablet Take 1 tablet (30 mg total) by mouth daily. 90 tablet 3   metoprolol succinate (TOPROL-XL) 50 MG 24 hr tablet Take 1 tablet (50 mg total) by mouth daily. Take with or immediately following a meal. 90 tablet 3   oxyCODONE-acetaminophen (PERCOCET) 5-325 MG tablet Take 1 tablet by mouth every 6 (six) hours as needed for severe pain. (Patient not taking: No sig reported) 8 tablet 0   oxyCODONE-acetaminophen (PERCOCET) 5-325 MG tablet Take 1 tablet by mouth every 6 (six) hours as needed for severe pain. (Patient not taking: No sig reported) 8 tablet 0   No current facility-administered medications for this visit.     PHYSICAL EXAM: Vitals:   02/05/21 0946  BP: (!) 149/71  Pulse: (!) 101  Resp: 18  Temp: 97.9 F (36.6 C)  TempSrc: Temporal  SpO2: 98%  Weight: 174 lb 9.6 oz (79.2 kg)  Height: 5' 7"  (  1.702 m)    GENERAL: The patient is a well-nourished female, in no acute distress. The vital signs are documented above. She has an excellent thrill in her fistula.  She has had poor maturation and size of her fistula.  I imaged her vein with SonoSite ultrasound.  The vein does run close to the surface and branches in the upper third of her forearm.  The majority of the flow goes into the basilic system.  MEDICAL ISSUES: I discussed this with the patient.  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  This does not feel pulsatile in nature and she does have a good thrill.  I feel that  this is related to the vein size and not any technical issue.  I have recommended return to the operating room for upper arm fistula versus AV graft placement.  I discussed the possibility of AV graft placement with a prosthetic Gore-Tex graft from the brachial artery to the axillary vein.  She is not willing to consider any further surgery.  She also reports that she does not want to have access in her arm since she does not want to be stuck.  She does have issues with pain management.  I would recommend left arm AV fistula versus graft placement in her upper arm.  She will continue her discussion with Dr. Theador Hawthorne and we are available if she is agreeable   [name], MD [facility] [phone]  Note: Portions of this report may have been transcribed using voice recognition software.  Every effort has been made to ensure accuracy; however, inadvertent computerized transcription errors may still be present.

## 2021-02-06 ENCOUNTER — Other Ambulatory Visit: Payer: Self-pay | Admitting: *Deleted

## 2021-02-06 ENCOUNTER — Other Ambulatory Visit: Payer: Self-pay | Admitting: Nurse Practitioner

## 2021-02-06 DIAGNOSIS — M79642 Pain in left hand: Secondary | ICD-10-CM

## 2021-02-06 DIAGNOSIS — Z789 Other specified health status: Secondary | ICD-10-CM

## 2021-02-06 NOTE — Telephone Encounter (Signed)
[name] called back today with pt readings since. Before breakfast and Before bed   11/14 252, 212  11/15 287, 194  11/16 225, 139  11/17 227

## 2021-02-06 NOTE — Telephone Encounter (Signed)
Tell her to increase her Lantus to 20 units SQ nightly.

## 2021-02-06 NOTE — Patient Outreach (Signed)
Medicaid Managed Care   Nurse Care Manager Note  02/06/2021 Name:  Latoya Walsh MRN:  [id number] DOB:  Nov 12, 1960  Latoya Walsh is an 60 y.o. year old female who is a primary patient of [name], MD.  The [facility] Managed Care Coordination team was consulted for assistance with:    CHF DMII  Latoya Walsh was given information about Medicaid Managed Care Coordination team services today. Latoya Walsh Patient agreed to services and verbal consent obtained.  Engaged with patient by telephone for follow up visit in response to provider referral for case management and/or care coordination services.   Assessments/Interventions:  Review of past medical history, allergies, medications, health status, including review of consultants reports, laboratory and other test data, was performed as part of comprehensive evaluation and provision of chronic care management services.  SDOH (Social Determinants of Health) assessments and interventions performed: SDOH Interventions    Flowsheet Row Most Recent Value  SDOH Interventions   Food Insecurity Interventions Intervention Not Indicated  [Patient has ample amount of food at this time.]  Housing Interventions Other (Comment)  [Care Guide referral for assistance with heat]       Care Plan  Allergies  Allergen Reactions   Penicillins Itching and Rash    Medications Reviewed Today     Reviewed by Tagen Montane, RN (Registered Nurse) on 02/06/21 at 1326  Med List Status: <None>   Medication Order Taking? Sig Documenting Provider Last Dose Status Informant  ACCU-CHEK GUIDE test strip [id number] Yes  [provider] Taking Active   Accu-Chek Softclix Lancets lancets [id number] Yes 2 (two) times daily. [provider] Taking Active Multiple Informants  amLODipine (NORVASC) 5 MG tablet [id number] No TAKE ONE TABLET BY MOUTH ONCE DAILY. [name], MD Unknown Active Multiple Informants           Med  Note Corky Mull   Wed Dec 25, 2020  8:41 AM) Recent change to 10 mg on 12-18-20 ( not taking yet)  blood glucose meter kit and supplies [id number] Yes Dispense based on patient and insurance preference. Use to monitor glucose twice daily. [name], NP Taking Active Multiple Informants  calcitRIOL (ROCALTROL) 0.25 MCG capsule [id number] No Take 0.5 mcg by mouth daily. [provider] Unknown Active Multiple Informants           Med Note (Adan Baehr A   Thu Dec 19, 2020  1:12 PM) 9/27-dose change to two tablets daily  furosemide (LASIX) 80 MG tablet [id number] Yes Take 80 mg  (1 tablet) in the morning and 40 mg (half tablet) in the afternoon. [name], MD Taking Active Multiple Informants  gabapentin (NEURONTIN) 100 MG capsule [id number] Yes Take 1 capsule (100 mg total) by mouth 3 (three) times daily. [name], NP Taking Active Multiple Informants  glipiZIDE (GLUCOTROL XL) 5 MG 24 hr tablet [id number] No TAKE ONE TABLET BY MOUTH EVERY MORNING WITH BREAKFAST  Patient not taking: Reported on 02/06/2021   [name], MD Not Taking Active   hydrALAZINE (APRESOLINE) 50 MG tablet [id number] No TAKE ONE TABLET BY MOUTH EVERY 8 HOURS  Patient taking differently: Take 50 mg by mouth 3 (three) times daily.   [name], MD Unknown Active   insulin glargine (LANTUS SOLOSTAR) 100 UNIT/ML Solostar Pen [id number] Yes Inject 10 Units into the skin at bedtime. [name], NP Taking Active   Insulin Pen Needle (PEN NEEDLES) 31G X 5 MM  [place] [id number] Yes Use to inject insulin once daily [name], NP Taking Active Multiple Informants  isosorbide mononitrate (IMDUR) 30 MG 24 hr tablet [id number]  Take 1 tablet (30 mg total) by mouth daily. [name], MD  Expired 12/25/20 2359 Multiple Informants           Med Note Garlan Fillers, SHERRY D   Thu Jan 02, 2021 10:26 AM) Needs new rx  metoprolol succinate (TOPROL-XL) 50 MG 24 hr tablet [id number]  Take 1  tablet (50 mg total) by mouth daily. Take with or immediately following a meal. [name], MD  Expired 12/25/20 2359 Multiple Informants           Med Note Garlan Fillers, SHERRY D   Thu Jan 02, 2021 10:26 AM) Needs new rx  mirtazapine (REMERON) 15 MG tablet [id number] No TAKE (1) TABLET BY MOUTH AT BEDTIME.  Patient taking differently: Take 15 mg by mouth at bedtime.   [name], MD Unknown Active   oxyCODONE-acetaminophen (PERCOCET) 5-325 MG tablet [id number] No Take 1 tablet by mouth every 6 (six) hours as needed for severe pain.  Patient not taking: Reported on 01/10/2021   [name], MD Not Taking Active            Med Note [place] Ponder D   Thu Jan 02, 2021 10:26 AM) Need refill  oxyCODONE-acetaminophen (PERCOCET) 5-325 MG tablet [id number] No Take 1 tablet by mouth every 6 (six) hours as needed for severe pain.  Patient not taking: Reported on 12/19/2020   [name], MD Not Taking Active   rosuvastatin (CRESTOR) 10 MG tablet [id number] No Take 1 tablet (10 mg total) by mouth daily. [name], MD Unknown Active Multiple Informants  sevelamer carbonate (RENVELA) 800 MG tablet [id number] No Take 800 mg by mouth 3 (three) times daily with meals. [provider] Unknown Active Multiple Informants           Med Note (Phenix Grein A   Thu Dec 19, 2020  1:13 PM) This medication to be delivered today 12/19/20  sodium bicarbonate 650 MG tablet [id number] No Take 650 mg by mouth 3 (three) times daily. [provider] Unknown Active Multiple Informants  Med List Note [name], [place] 09/20/14 0109):              Patient Active Problem List   Diagnosis Date Noted   ESRD (end stage renal disease) ([place]) 01/06/2021   CHF (congestive heart failure) ([place]) 06/06/2020   Leg swelling 04/28/2020   Hyperlipidemia LDL goal <100 04/28/2020   Left hand pain 03/10/2020   Anemia 02/19/2020   Depression, major, single episode, in partial remission ([place])  01/25/2019   Headache 11/13/2018   Insomnia 09/25/2018   Screening for colorectal cancer 07/23/2016   Lumbar back pain with radiculopathy affecting right lower extremity 03/27/2015   Non compliance with medical treatment 07/22/2014   Chronic hepatitis C without hepatic coma ([place]) 12/04/2013   Allergic rhinitis 11/03/2012   Severe hypertension 06/25/2011   Anxiety and depression 02/05/2011   Nicotine dependence 11/01/2010   Drug dependence, continuous abuse ([place]) 03/22/2010   Uncontrolled type 2 diabetes mellitus with hyperglycemia ([place]) 06/02/2007   Alcohol abuse 06/02/2007    Conditions to be addressed/monitored per PCP order:  CHF and DMII  Care Plan : Heart Failure (Adult)  Updates made by Rahmah Montane, RN since 02/06/2021 12:00 AM  Completed 02/06/2021   Problem: Symptom Exacerbation (Heart Failure) Resolved 02/06/2021  Note:   Resolving due to duplicate goal     Long-Range Goal: Symptom Exacerbation Prevented or Minimized Completed 02/06/2021  Start Date: 08/09/2020  Expected End Date: 02/04/2021  Recent Progress: On track  Priority: High  Note:   Current Barriers:  Knowledge deficit related to basic heart failure pathophysiology and self care management-Ms. Kirt is managing her health with the assistance of an Aide. Today patient was alert, but confused at times and requested RNCM to speak with her Aide. Ms. Noguchi was not at home and was unable to review her medications. She has recently began receiving Paramedic support for medication management. [name] is unsure of medications. She is taking her "bubble packs" 3 times a day and is part of the Paramedicine program. [name] comes out every Friday and checks BP and BS. Ms. Letendre continues to feeling "sleepy" after taking her medications.  Ms. Mcconathy did receive a scale, but is not weighing daily. She is trying to get some exercise by walking up and down the road, but reports back pain afterward. Ms. Schellinger is aware of  local food pantries and plans to go later today. Ms. Duquette reports improvement in the way she feels during the day since taking toprol at night instead of during the day. She has cut back on smoking to 1/2 a cigarette after meals. Ms. Bain reports taking all medications as instructed. She does not have an appetite. Patient is ready to start dialysis.-Update-Ms. Sieh has dialysis T/TH/S, she will transfer to [facility] in [place] next week. Ms. Plunk is excited, because her twin sister is at this location. She reports feeling tired and would like more PCS hours. Currently she recieves 2hrs/7 days a week. Knowledge Deficits related to heart failure medications Patient does not have readable scale Literacy Barriers Financial strain Cognitive Deficits Unable to self administer medications as prescribed Does not adhere to provider recommendations re:  Does not contact provider office for questions/concerns Resolving due to duplicate goal  Case Manager Clinical Goal(s):  patient will weigh self daily and record patient will verbalize understanding of Heart Failure Action Plan and when to call doctor patient will take all Heart Failure mediations as prescribed patient will weigh daily and record (notifying MD of 3 lb weight gain over night or 5 lb in a week) Interventions:   Reviewed upcoming appointments: Dialysis T/TH/S, 11/10 with Cardiology and 11/16 with [facility]-patient has transportation to all upcoming appointments Collaborated with PCP re: patient requesting increased PCS hours, provided U[employer] information-provider to call and request increased hours Provided encouragement Collaborated with PCS Aide re: diet and medications  Provided written information on diabetic and dash diet Provided therapeutic listening Advised patient take all medications as instructed Patient Goals/Self-Care Activities - work on cutting out smoking completely - attend scheduled appointments - replace chips  with a healthy snack like nuts - eat more whole grains, fruits and vegetables, lean meats and healthy fats - know when to call the doctor - track symptoms and what helps feel better or worse - dress right for the weather, hot or cold  - call office if I gain more than 2 pounds in one day or 5 pounds in one week - use salt in moderation - watch for swelling in feet, ankles and legs every day - weigh myself daily  Follow Up Plan: Telephone follow up appointment with care management team member scheduled for:01/24/21 @ 1:15pm     Care Plan : Diabetes Type 2 (Adult)  Updates made by [name],  Sharion Settler, RN since 02/06/2021 12:00 AM  Completed 02/06/2021   Problem: Glycemic Management (Diabetes, Type 2) Resolved 02/06/2021  Note:   Resolving due to duplicate goal     Long-Range Goal: Glycemic Management Optimized Completed 02/06/2021  Start Date: 08/09/2020  Expected End Date: 02/04/2021  Recent Progress: On track  Priority: Medium  Note:   Objective:  Lab Results  Component Value Date   HGBA1C 8.5 (A) 09/17/2020   HGBA1C 8.5 09/17/2020   HGBA1C 8.5 (A) 09/17/2020   HGBA1C 8.5 (A) 09/17/2020   Lab Results  Component Value Date   CREATININE 4.30 (H) 11/26/2020   CREATININE 2.81 (H) 06/10/2020   CREATININE 2.53 (H) 06/09/2020   No results found for: EGFR Resolving due to duplicate goal  Current Barriers:  Knowledge Deficits related to basic Diabetes pathophysiology and self care/management-RNCM reviewed note in patients chart regarding elevated blood sugars and PCP would like for patient to be seen this week. Patient reports checking her BS daily, yesterday reading was 202. Patient with decreased appetite and unclear about what she is eating. Patient was evaluated and referral to Endocrinology was placed. Ms. Brophy has started attending a bible study on Monday and Thursday evenings. Ms. Denny has not been checking her BS, due to not having test strips. 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 She continues to have Paramedic, [name] come out and assist with medication management. Ms. Westerfeld continues to not have any test strips to monitor blood sugar. Patient with decreased appetite. Ms. Garant now has correct test strips, thanks to [name]. [name] instructed patient on correct strips. Patient is not checking her blood sugar and states "my mind is not on checking my blood sugar." She is more concerned about starting dialysis in the next two weeks. She does continue to participate in bible study on Thursday nights and enjoys this connection.-Update-Ms. Greenhouse has dialysis T/TH/S, she has not been checking her BS, but feels like her BS is high in the mornings. She is not taking bedtime insulin, feels she doesn't need it.  She is aware of all upcoming appointments and has transportation.  Knowledge Deficits related to medications used for management of diabetes Difficulty obtaining or cannot afford medications Does not have glucometer to monitor blood sugar Financial Constraints Cognitive Deficits Unable to self administer medications as prescribed Does not contact provider office for questions/concerns Case Manager Clinical Goal(s):  patient will demonstrate improved adherence to prescribed treatment plan for diabetes self care/management as evidenced by: daily monitoring and recording of CBG,  adherence to ADA/ carb modified diet, adherence to prescribed medication regimen, contacting provider for new or worsened symptoms or questions.   Interventions:  Inter-disciplinary care team collaboration (see longitudinal plan of care) Provided education to patient about basic DM disease process Discussed plans with patient for ongoing care management follow up and provided patient with direct contact information for care management team Provided patient with written educational materials related to diabetic nutrition, dialysis and heart  failure Reviewed upcoming appointments: Dialysis T/TH/S, 11/10 with Cardiology and 11/16 with [facility]-patient has transportation to all upcoming appointments Review of patient status, including review of consultants reports, relevant laboratory and other test results, and medications completed. Discussed with [place] Aide the importance of Ms. Bale eating 3 small meals a day, including protein and vegetables  Will follow closely with frequent telephone visits Discussed with patient and [place] the importance of taking all medications as directed, including insulin Self-Care Activities -  Self administers oral medications as prescribed Attends all scheduled provider appointments Checks blood sugars as prescribed and utilize hyper and hypoglycemia protocol as needed Adheres to prescribed ADA/carb modified Patient Goals: - take your current glucometer to the Pharmacy to obtain accurate test strips - obtain appropriate test strips and check your blood sugar before breakfast and at bedtime daily - attend appointment on Dialysis T/TH/S, 11/10 with Cardiology and 11/16 with [facility]-patient has transportation to all upcoming appointments - adhere to a diabetic diet, eat three meals a day, including protein with each meal - check blood sugar at prescribed times - check blood sugar if I feel it is too high or too low - call your provider with abnormal readings - enter blood sugar readings and medication or insulin into daily log - take the blood sugar log to all doctor visits - take the blood sugar meter to all doctor visits  - increase exercise to 30 min a day/5 days a week Follow Up Plan: Telephone follow up appointment with care management team member scheduled for:01/24/21 @ 1:15p     Care Plan : RN Case Manager Plan of Care  Updates made by Shemekia Montane, RN since 02/06/2021 12:00 AM     Problem: Knowledge Deficits and Care Coordination needs related to long term management of DMII and HF   Priority:  High     Long-Range Goal: Development of Plan of Care to address Care Coordination Needs and Knowledge deficits related to DMII and HF   Start Date: 02/06/2021  Expected End Date: 05/07/2021  Priority: High  Note:   Current Barriers:  Knowledge Deficits related to plan of care for management of CHF and DMII  Care Coordination needs related to Lack of essential utilities - heating*  Film/video editor.  Literacy barriers  RNCM Clinical Goal(s):  Patient will verbalize understanding of plan for management of CHF and DMII as evidenced by patient verbalization of and self monitoring activity take all medications exactly as prescribed and will call provider for medication related questions as evidenced by documentation in EMR    attend all scheduled medical appointments: 11/29 with [place] and 12/20 with Rayetta Pigg, NP for diabetes management as evidenced by physician notes in EMR        demonstrate improved adherence to prescribed treatment plan for CHF and DMII as evidenced by physician documentation in EMR continue to work with RN Care Manager and/or Social Worker to address care management and care coordination needs related to CHF and DMII as evidenced by adherence to CM Team Scheduled appointments     work with pharmacist to address medication management related to CHF and DMII as evidenced by review of EMR and patient or pharmacist report    through collaboration with Consulting civil engineer, provider, and care team.   Interventions: Inter-disciplinary care team collaboration (see longitudinal plan of care) Evaluation of current treatment plan related to  self management and patient's adherence to plan as established by provider   Heart Failure Interventions:  (Status: New goal.)  Long Term Goal  Provided education on low sodium diet Advised patient to weigh each morning after emptying bladder Discussed importance of daily weight and advised patient to weigh and record  daily Discussed the importance of keeping all appointments with provider Referral made to community resources care guide team for assistance with heating Assessed social determinant of health barriers  Diabetes:  (Status: New goal.) Long Term Goal   Lab Results  Component Value Date  HGBA1C 8.5 (A) 09/17/2020   HGBA1C 8.5 09/17/2020   HGBA1C 8.5 (A) 09/17/2020   HGBA1C 8.5 (A) 09/17/2020  Assessed patient's understanding of A1c goal: <7% Reviewed medications with patient and discussed importance of medication adherence;        Reviewed prescribed diet with patient provided examples of healthy options; Discussed plans with patient for ongoing care management follow up and provided patient with direct contact information for care management team;      Provided patient with written educational materials related to hypo and hyperglycemia and importance of correct treatment;       Reviewed scheduled/upcoming provider appointments including: 11/29 with [facility] and 12/20 with Endocrinology;         call provider for findings outside established parameters;       Review of patient status, including review of consultants reports, relevant laboratory and other test results, and medications completed;       Assessed social determinant of health barriers;         Patient Goals/Self-Care Activities: Patient will self administer medications as prescribed as evidenced by self report/primary caregiver report  Patient will attend all scheduled provider appointments as evidenced by clinician review of documented attendance to scheduled appointments and patient/caregiver report Patient will call pharmacy for medication refills as evidenced by patient report and review of pharmacy fill history as appropriate Patient will attend church or other social activities as evidenced by patient report Patient will call provider office for new concerns or questions as evidenced by review of documented incoming  telephone call notes and patient report call office if I gain more than 2 pounds in one day or 5 pounds in one week keep legs up while sitting use salt in moderation weigh myself daily eat more whole grains, fruits and vegetables, lean meats and healthy fats dress right for the weather, hot or cold check blood sugar at prescribed times: twice daily before breakfast and before bed  enter blood sugar readings and medication or insulin into daily log take the blood sugar log to all doctor visits take the blood sugar meter to all doctor visits fill half of plate with vegetables switch to sugar-free drinks Take all medications to all doctor appointments       Follow Up:  Patient agrees to Care Plan and Follow-up.  Plan: The Managed Medicaid care management team will reach out to the patient again over the next 14 days.  Date/time of next scheduled RN care management/care coordination outreach:  02/20/21 @ 2:30pm  [name] RN, [place] RN Care Coordinator

## 2021-02-06 NOTE — Telephone Encounter (Signed)
Called and informed [name]

## 2021-02-06 NOTE — Patient Instructions (Signed)
Visit Information  Latoya Walsh was given information about Medicaid Managed Care team care coordination services as a part of their [place] Medicaid benefit. Latoya Walsh verbally consented to engagement with the [facility] Managed Care team.   If you are experiencing a medical emergency, please call 911 or report to your local emergency department or urgent care.   If you have a non-emergency medical problem during routine business hours, please contact your provider's office and ask to speak with a nurse.   For questions related to your [facility], please call: [phone] or visit the homepage here: [email or web address]  If you would like to schedule transportation through your [facility], please call the following number at least 2 days in advance of your appointment: [phone].   Call the [place] at [phone], at any time, 24 hours a day, 7 days a week. If you are in danger or need immediate medical attention call 911.  If you would like help to quit smoking, call 1-800-QUIT-NOW (785) 673-2452) OR Espaol: 1-855-Djelo-Ya (9-233-007-6226) o para ms informacin haga clic aqu or Text READY to 200-400 to register via text  [name] - following are the goals we discussed in your visit today:   Goals Addressed             This Visit's Progress    COMPLETED: Monitor and Manage My Blood Sugar-Diabetes Type 2       Resolving due to duplicate goal  Timeframe:  Long-Range Goal Priority:  Medium Start Date:  08/09/20                           Expected End Date:  ongoing                     Follow Up Date  01/24/21   - take your current glucometer to the Pharmacy to obtain accurate test strips - obtain appropriate test strips and check your blood sugar before breakfast and at bedtime daily - attend appointment on Dialysis  T/TH/S, 11/10 with Cardiology and 11/16 with [facility]-patient has transportation to all upcoming appointments - adhere to a diabetic diet, eat three meals a day, including protein with each meal - check blood sugar if I feel it is too high or too low - call your provider with abnormal readings - enter blood sugar readings and medication or insulin into daily log - take the blood sugar log to all doctor visits - take the blood sugar meter to all doctor visits  - increase exercise to 30 min a day/5 days a week   Why is this important?   Checking your blood sugar at home helps to keep it from getting very high or very low.  Writing the results in a diary or log helps the doctor know how to care for you.  Your blood sugar log should have the time, date and the results.  Also, write down the amount of insulin or other medicine that you take.  Other information, like what you ate, exercise done and how you were feeling, will also be helpful.          COMPLETED: Track and Manage Fluids and Swelling-Heart Failure       Resolving due to duplicate goal  Timeframe:  Long-Range Goal Priority:  High Start Date:    08/09/20  Expected End Date:    ongoing                   Follow Up Date 01/24/21   - weigh at the same time each day - call office if I gain more than 2 pounds in one day or 5 pounds in one week - use salt in moderation - watch for swelling in feet, ankles and legs every day - call provider with any concerns or questions     Why is this important?   It is important to check your weight daily and watch how much salt and liquids you have.  It will help you to manage your heart failure.         COMPLETED: Track and Manage Symptoms-Heart Failure       Resolving due to duplicate goal  Timeframe:  Long-Range Goal Priority:  High Start Date:   08/09/20                          Expected End Date:    ongoing                  Follow Up Date 01/24/21    - work on  cutting out smoking completely - replace chips with a healthy snack like nuts - eat more whole grains, fruits and vegetables, lean meats and healthy fats - know when to call the doctor - track symptoms and what helps feel better or worse - dress right for the weather, hot or cold    Why is this important?   You will be able to handle your symptoms better if you keep track of them.  Making some simple changes to your lifestyle will help.  Eating healthy is one thing you can do to take good care of yourself.            Please see education materials related to diabetes and heart failure provided as print materials.   The patient verbalized understanding of instructions provided today and agreed to receive a mailed copy of patient instruction and/or educational materials.  Telephone follow up appointment with Managed Medicaid care management team member scheduled for:02/20/21 @ 2:30pm  [name] RN, BSN [place] RN Care Coordinator   Following is a copy of your plan of care:  Care Plan : Heart Failure (Adult)  Updates made by Lattie Montane, RN since 02/06/2021 12:00 AM  Completed 02/06/2021   Problem: Symptom Exacerbation (Heart Failure) Resolved 02/06/2021  Note:   Resolving due to duplicate goal     Long-Range Goal: Symptom Exacerbation Prevented or Minimized Completed 02/06/2021  Start Date: 08/09/2020  Expected End Date: 02/04/2021  Recent Progress: On track  Priority: High  Note:   Current Barriers:  Knowledge deficit related to basic heart failure pathophysiology and self care management-Latoya Walsh is managing her health with the assistance of an Aide. Today patient was alert, but confused at times and requested RNCM to speak with her Aide. Latoya Walsh was not at home and was unable to review her medications. She has recently began receiving Paramedic support for medication management. Latoya Walsh is unsure of medications. She is taking  her "bubble packs" 3 times a day and is part of the Paramedicine program. [name] comes out every Friday and checks BP and BS. Latoya Walsh continues to feeling "sleepy" after taking her medications.  Latoya Walsh did receive a scale, but is not  weighing daily. She is trying to get some exercise by walking up and down the road, but reports back pain afterward. Latoya Walsh is aware of local food pantries and plans to go later today. Latoya Walsh reports improvement in the way she feels during the day since taking toprol at night instead of during the day. She has cut back on smoking to 1/2 a cigarette after meals. Latoya Walsh reports taking all medications as instructed. She does not have an appetite. Patient is ready to start dialysis.-Update-Latoya Walsh has dialysis T/TH/S, she will transfer to [facility] in [place] next week. Latoya Walsh is excited, because her twin sister is at this location. She reports feeling tired and would like more PCS hours. Currently she recieves 2hrs/7 days a week. Knowledge Deficits related to heart failure medications Patient does not have readable scale Literacy Barriers Financial strain Cognitive Deficits Unable to self administer medications as prescribed Does not adhere to provider recommendations re:  Does not contact provider office for questions/concerns Resolving due to duplicate goal  Case Manager Clinical Goal(s):  patient will weigh self daily and record patient will verbalize understanding of Heart Failure Action Plan and when to call doctor patient will take all Heart Failure mediations as prescribed patient will weigh daily and record (notifying MD of 3 lb weight gain over night or 5 lb in a week) Interventions:   Reviewed upcoming appointments: Dialysis T/TH/S, 11/10 with Cardiology and 11/16 with [facility]-patient has transportation to all upcoming appointments Collaborated with PCP re: patient requesting increased PCS hours, provided UHC information-provider to  call and request increased hours Provided encouragement Collaborated with PCS Aide re: diet and medications  Provided written information on diabetic and dash diet Provided therapeutic listening Advised patient take all medications as instructed Patient Goals/Self-Care Activities - work on cutting out smoking completely - attend scheduled appointments - replace chips with a healthy snack like nuts - eat more whole grains, fruits and vegetables, lean meats and healthy fats - know when to call the doctor - track symptoms and what helps feel better or worse - dress right for the weather, hot or cold  - call office if I gain more than 2 pounds in one day or 5 pounds in one week - use salt in moderation - watch for swelling in feet, ankles and legs every day - weigh myself daily  Follow Up Plan: Telephone follow up appointment with care management team member scheduled for:01/24/21 @ 1:15pm     Care Plan : Diabetes Type 2 (Adult)  Updates made by Kaleeya Montane, RN since 02/06/2021 12:00 AM  Completed 02/06/2021   Problem: Glycemic Management (Diabetes, Type 2) Resolved 02/06/2021  Note:   Resolving due to duplicate goal     Long-Range Goal: Glycemic Management Optimized Completed 02/06/2021  Start Date: 08/09/2020  Expected End Date: 02/04/2021  Recent Progress: On track  Priority: Medium  Note:   Objective:  Lab Results  Component Value Date   HGBA1C 8.5 (A) 09/17/2020   HGBA1C 8.5 09/17/2020   HGBA1C 8.5 (A) 09/17/2020   HGBA1C 8.5 (A) 09/17/2020   Lab Results  Component Value Date   CREATININE 4.30 (H) 11/26/2020   CREATININE 2.81 (H) 06/10/2020   CREATININE 2.53 (H) 06/09/2020   No results found for: EGFR Resolving due to duplicate goal  Current Barriers:  Knowledge Deficits related to basic Diabetes pathophysiology and self care/management-RNCM reviewed note in patients chart regarding elevated blood sugars and PCP would like for patient to be  seen this week.  Patient reports checking her BS daily, yesterday reading was 202. Patient with decreased appetite and unclear about what she is eating. Patient was evaluated and referral to Endocrinology was placed. Latoya Walsh has started attending a bible study on Monday and Thursday evenings. Latoya Walsh has not been checking her BS, due to not having test strips. She had surgery for fistula creation on 9/6 and reports doing fine. She continues to have Paramedic, [name] come out and assist with medication management. Latoya Walsh continues to not have any test strips to monitor blood sugar. Patient with decreased appetite. Latoya Walsh now has correct test strips, thanks to [name]. [name] instructed patient on correct strips. Patient is not checking her blood sugar and states "my mind is not on checking my blood sugar." She is more concerned about starting dialysis in the next two weeks. She does continue to participate in bible study on Thursday nights and enjoys this connection.-Update-Latoya Walsh has dialysis T/TH/S, she has not been checking her BS, but feels like her BS is high in the mornings. She is not taking bedtime insulin, feels she doesn't need it.  She is aware of all upcoming appointments and has transportation.  Knowledge Deficits related to medications used for management of diabetes Difficulty obtaining or cannot afford medications Does not have glucometer to monitor blood sugar Financial Constraints Cognitive Deficits Unable to self administer medications as prescribed Does not contact provider office for questions/concerns Case Manager Clinical Goal(s):  patient will demonstrate improved adherence to prescribed treatment plan for diabetes self care/management as evidenced by: daily monitoring and recording of CBG,  adherence to ADA/ carb modified diet, adherence to prescribed medication regimen, contacting provider for new or worsened symptoms or questions.   Interventions:   Inter-disciplinary care team collaboration (see longitudinal plan of care) Provided education to patient about basic DM disease process Discussed plans with patient for ongoing care management follow up and provided patient with direct contact information for care management team Provided patient with written educational materials related to diabetic nutrition, dialysis and heart failure Reviewed upcoming appointments: Dialysis T/TH/S, 11/10 with Cardiology and 11/16 with [facility]-patient has transportation to all upcoming appointments Review of patient status, including review of consultants reports, relevant laboratory and other test results, and medications completed. Discussed with Zebulon Aide the importance of Latoya Walsh eating 3 small meals a day, including protein and vegetables  Will follow closely with frequent telephone visits Discussed with patient and Aneta the importance of taking all medications as directed, including insulin Self-Care Activities - Self administers oral medications as prescribed Attends all scheduled provider appointments Checks blood sugars as prescribed and utilize hyper and hypoglycemia protocol as needed Adheres to prescribed ADA/carb modified Patient Goals: - take your current glucometer to the Pharmacy to obtain accurate test strips - obtain appropriate test strips and check your blood sugar before breakfast and at bedtime daily - attend appointment on Dialysis T/TH/S, 11/10 with Cardiology and 11/16 with [facility]-patient has transportation to all upcoming appointments - adhere to a diabetic diet, eat three meals a day, including protein with each meal - check blood sugar at prescribed times - check blood sugar if I feel it is too high or too low - call your provider with abnormal readings - enter blood sugar readings and medication or insulin into daily log - take the blood sugar log to all doctor visits - take the blood sugar meter to all doctor visits  -  increase exercise to  30 min a day/5 days a week Follow Up Plan: Telephone follow up appointment with care management team member scheduled for:01/24/21 @ 1:15p     Care Plan : RN Case Manager Plan of Care  Updates made by Avonda Montane, RN since 02/06/2021 12:00 AM     Problem: Knowledge Deficits and Care Coordination needs related to long term management of DMII and HF   Priority: High     Long-Range Goal: Development of Plan of Care to address Care Coordination Needs and Knowledge deficits related to DMII and HF   Start Date: 02/06/2021  Expected End Date: 05/07/2021  Priority: High  Note:   Current Barriers:  Knowledge Deficits related to plan of care for management of CHF and DMII  Care Coordination needs related to Lack of essential utilities - heating*  Film/video editor.  Literacy barriers  RNCM Clinical Goal(s):  Patient will verbalize understanding of plan for management of CHF and DMII as evidenced by patient verbalization of and self monitoring activity take all medications exactly as prescribed and will call provider for medication related questions as evidenced by documentation in EMR    attend all scheduled medical appointments: 11/29 with [facility] and 12/20 with Rayetta Pigg, NP for diabetes management as evidenced by physician notes in EMR        demonstrate improved adherence to prescribed treatment plan for CHF and DMII as evidenced by physician documentation in EMR continue to work with RN Care Manager and/or Social Worker to address care management and care coordination needs related to CHF and DMII as evidenced by adherence to CM Team Scheduled appointments     work with pharmacist to address medication management related to CHF and DMII as evidenced by review of EMR and patient or pharmacist report    through collaboration with Consulting civil engineer, provider, and care team.   Interventions: Inter-disciplinary care team collaboration (see longitudinal  plan of care) Evaluation of current treatment plan related to  self management and patient's adherence to plan as established by provider   Heart Failure Interventions:  (Status: New goal.)  Long Term Goal  Provided education on low sodium diet Advised patient to weigh each morning after emptying bladder Discussed importance of daily weight and advised patient to weigh and record daily Discussed the importance of keeping all appointments with provider Referral made to community resources care guide team for assistance with heating Assessed social determinant of health barriers  Diabetes:  (Status: New goal.) Long Term Goal   Lab Results  Component Value Date   HGBA1C 8.5 (A) 09/17/2020   HGBA1C 8.5 09/17/2020   HGBA1C 8.5 (A) 09/17/2020   HGBA1C 8.5 (A) 09/17/2020  Assessed patient's understanding of A1c goal: <7% Reviewed medications with patient and discussed importance of medication adherence;        Reviewed prescribed diet with patient provided examples of healthy options; Discussed plans with patient for ongoing care management follow up and provided patient with direct contact information for care management team;      Provided patient with written educational materials related to hypo and hyperglycemia and importance of correct treatment;       Reviewed scheduled/upcoming provider appointments including: 11/29 with [facility] and 12/20 with Endocrinology;         call provider for findings outside established parameters;       Review of patient status, including review of consultants reports, relevant laboratory and other test results, and medications completed;  Assessed social determinant of health barriers;         Patient Goals/Self-Care Activities: Patient will self administer medications as prescribed as evidenced by self report/primary caregiver report  Patient will attend all scheduled provider appointments as evidenced by clinician review of documented  attendance to scheduled appointments and patient/caregiver report Patient will call pharmacy for medication refills as evidenced by patient report and review of pharmacy fill history as appropriate Patient will attend church or other social activities as evidenced by patient report Patient will call provider office for new concerns or questions as evidenced by review of documented incoming telephone call notes and patient report call office if I gain more than 2 pounds in one day or 5 pounds in one week keep legs up while sitting use salt in moderation weigh myself daily eat more whole grains, fruits and vegetables, lean meats and healthy fats dress right for the weather, hot or cold check blood sugar at prescribed times: twice daily before breakfast and before bed  enter blood sugar readings and medication or insulin into daily log take the blood sugar log to all doctor visits take the blood sugar meter to all doctor visits fill half of plate with vegetables switch to sugar-free drinks Take all medications to all doctor appointments

## 2021-02-11 ENCOUNTER — Other Ambulatory Visit: Payer: Self-pay

## 2021-02-12 ENCOUNTER — Telehealth: Payer: Self-pay

## 2021-02-12 DIAGNOSIS — Z0279 Encounter for issue of other medical certificate: Secondary | ICD-10-CM

## 2021-02-12 NOTE — Telephone Encounter (Signed)
CAP forms  Copied Noted Sleeved  

## 2021-02-17 NOTE — Telephone Encounter (Signed)
Forms faxed to [phone] to [facility] Medicaid Cap Program

## 2021-02-18 ENCOUNTER — Other Ambulatory Visit: Payer: Self-pay

## 2021-02-18 ENCOUNTER — Encounter: Payer: Self-pay | Admitting: Student

## 2021-02-18 ENCOUNTER — Ambulatory Visit (INDEPENDENT_AMBULATORY_CARE_PROVIDER_SITE_OTHER): Payer: Medicaid Other | Admitting: Student

## 2021-02-18 VITALS — BP 130/78 | HR 88 | Ht 67.0 in | Wt 174.6 lb

## 2021-02-18 DIAGNOSIS — I5022 Chronic systolic (congestive) heart failure: Secondary | ICD-10-CM | POA: Diagnosis not present

## 2021-02-18 DIAGNOSIS — E782 Mixed hyperlipidemia: Secondary | ICD-10-CM

## 2021-02-18 DIAGNOSIS — I10 Essential (primary) hypertension: Secondary | ICD-10-CM | POA: Diagnosis not present

## 2021-02-18 DIAGNOSIS — N186 End stage renal disease: Secondary | ICD-10-CM

## 2021-02-18 NOTE — Progress Notes (Signed)
Cardiology Office Note    Date:  02/18/2021   ID:  HAJER DWYER, DOB 1960/07/16, MRN [id number]  PCP:  [name], MD  Cardiologist: Carlyle Dolly, MD    Chief Complaint  Patient presents with   Follow-up    3 month visit    History of Present Illness:    Latoya Walsh is a 60 y.o. female with past medical history of HFrEF (EF 30-35% by echo in 05/2020, at 45% by echo in 09/2020), HTN, HLD, Type 2 DM, ESRD, Hepatitis C and history of substance use who presents to the office today for 62-monthfollow-up.  She was last examined by myself in 10/2020 and reported baseline fatigue but denied any associated chest pain or dyspnea on exertion. She was continued on her current medication regimen of Hydralazine, Imdur and Toprol-XL for her cardiomyopathy.  In talking with the patient today, she reports she has been started on dialysis in the interim and is overall tolerating the sessions well. Says that she feels like she has more energy since being started on dialysis. Her twin sister is also undergoing dialysis and she is hopeful they can have their sessions on the same day. She denies any recent chest pain or dyspnea on exertion. No recent orthopnea, PND or pitting edema. She does report some abdominal distention intermittently. She is still taking Lasix and reports frequent urination with this.   Past Medical History:  Diagnosis Date   Allergic rhinitis    Anxiety    CHF (congestive heart failure) ([place]    a. EF 30-35% by echo in 05/2020 b. EF at 45% in 09/2020   Chronic back pain    Chronic hepatitis C without hepatic coma ([place]    COVID-19 virus infection 12/26/2019   Depression    Diabetes mellitus    Hepatitis C    Hypertension    Noncompliance    Poor appetite 07/17/2014   Substance abuse ([facility]    HX of drug use and alcohol use    Past Surgical History:  Procedure Laterality Date   APPENDECTOMY  2004   AV FISTULA PLACEMENT Left 11/26/2020   Procedure:  LEFT ARM ARTERIOVENOUS (AV) FISTULA CREATION;  Surgeon: ERosetta Posner MD;  Location: [facility];  Service: Vascular;  Laterality: Left;   IR FLU[place]O GUIDE CV LINE RIGHT  12/25/2020   IR [place] VASC ACCESS RIGHT  12/25/2020    Current Medications: Outpatient Medications Prior to Visit  Medication Sig Dispense Refill   ACCU-CHEK GUIDE test strip      Accu-Chek Softclix Lancets lancets 2 (two) times daily.     amLODipine (NORVASC) 5 MG tablet TAKE ONE TABLET BY MOUTH ONCE DAILY. 30 tablet 0   blood glucose meter kit and supplies Dispense based on patient and insurance preference. Use to monitor glucose twice daily. 1 each 0   calcitRIOL (ROCALTROL) 0.25 MCG capsule Take 0.5 mcg by mouth daily.     furosemide (LASIX) 80 MG tablet Take 80 mg  (1 tablet) in the morning and 40 mg (half tablet) in the afternoon. 135 tablet 3   gabapentin (NEURONTIN) 100 MG capsule TAKE ONE CAPSULE BY MOUTH THREE TIMES DAILY 90 capsule 1   hydrALAZINE (APRESOLINE) 50 MG tablet TAKE ONE TABLET BY MOUTH EVERY 8 HOURS (Patient taking differently: Take 50 mg by mouth 3 (three) times daily.) 90 tablet 3   insulin glargine (LANTUS SOLOSTAR) 100 UNIT/ML Solostar Pen Inject 10 Units into the skin at bedtime. 15  mL 1   Insulin Pen Needle (PEN NEEDLES) 31G X 5 MM MISC Use to inject insulin once daily 90 each 3   isosorbide mononitrate (IMDUR) 30 MG 24 hr tablet Take 1 tablet (30 mg total) by mouth daily. 90 tablet 3   metoprolol succinate (TOPROL-XL) 50 MG 24 hr tablet Take 1 tablet (50 mg total) by mouth daily. Take with or immediately following a meal. 90 tablet 3   mirtazapine (REMERON) 15 MG tablet TAKE (1) TABLET BY MOUTH AT BEDTIME. (Patient taking differently: Take 15 mg by mouth at bedtime.) 30 tablet 0   oxyCODONE-acetaminophen (PERCOCET) 5-325 MG tablet Take 1 tablet by mouth every 6 (six) hours as needed for severe pain. 8 tablet 0   oxyCODONE-acetaminophen (PERCOCET) 5-325 MG tablet Take 1 tablet by mouth every 6 (six)  hours as needed for severe pain. 8 tablet 0   rosuvastatin (CRESTOR) 10 MG tablet Take 1 tablet (10 mg total) by mouth daily. 90 tablet 3   sevelamer carbonate (RENVELA) 800 MG tablet Take 800 mg by mouth 3 (three) times daily with meals.     sodium bicarbonate 650 MG tablet Take 650 mg by mouth 3 (three) times daily.     glipiZIDE (GLUCOTROL XL) 5 MG 24 hr tablet TAKE ONE TABLET BY MOUTH EVERY MORNING WITH BREAKFAST (Patient not taking: Reported on 02/18/2021) 90 tablet 0   No facility-administered medications prior to visit.     Allergies:   Penicillins   Social History   Socioeconomic History   Marital status: Single    Spouse name: Not on file   Number of children: 3   Years of education: Not on file   Highest education level: Not on file  Occupational History   Occupation: Disabled  Tobacco Use   Smoking status: Light Smoker    Packs/day: 0.25    Types: Cigarettes   Smokeless tobacco: Never  Vaping Use   Vaping Use: Never used  Substance and Sexual Activity   Alcohol use: Not Currently    Alcohol/week: 40.0 standard drinks    Types: 40 Cans of beer per week    Comment: 2 16oz cans of beer a day   Drug use: Not Currently    Types: Marijuana, Cocaine    Comment: Last smoked marijuana yesterday 10/18/14   Sexual activity: Yes    Birth control/protection: Condom  Other Topics Concern   Not on file  Social History Narrative   Not on file   Social Determinants of Health   Financial Resource Strain: Low Risk    Difficulty of Paying Living Expenses: Not hard at all  Food Insecurity: Food Insecurity Present   Worried About Charity fundraiser in the Last Year: Sometimes true   Arboriculturist in the Last Year: Sometimes true  Transportation Needs: Unmet Transportation Needs   Lack of Transportation (Medical): Yes   Lack of Transportation (Non-Medical): No  Physical Activity: Inactive   Days of Exercise per Week: 0 days   Minutes of Exercise per Session: 0 min   Stress: Not on file  Social Connections: Moderately Integrated   Frequency of Communication with Friends and Family: More than three times a week   Frequency of Social Gatherings with Friends and Family: Once a week   Attends Religious Services: More than 4 times per year   Active Member of [employer] or Organizations: Yes   Attends Archivist Meetings: More than 4 times per year   Marital Status: Separated  Family History:  The patient's family history includes Diabetes in her sister.   Review of Systems:    Please see the history of present illness.     All other systems reviewed and are otherwise negative except as noted above.   Physical Exam:    VS:  BP 130/78   Pulse 88   Ht 5' 7"  (1.702 m)   Wt 174 lb 9.6 oz (79.2 kg)   LMP 06/23/2010   SpO2 96%   BMI 27.35 kg/m    General: Well developed, well nourished,female appearing in no acute distress. Head: Normocephalic, atraumatic. Neck: No carotid bruits. JVD not elevated.  Lungs: Respirations regular and unlabored, without wheezes or rales.  Heart: Regular rate and rhythm. No S3 or S4.  No murmur, no rubs, or gallops appreciated. Abdomen: Appears non-distended. No obvious abdominal masses. Msk:  Strength and tone appear normal for age. No obvious joint deformities or effusions. Extremities: No clubbing or cyanosis. No pitting edema.  Distal pedal pulses are 2+ bilaterally. Neuro: Alert and oriented X 3. Moves all extremities spontaneously. No focal deficits noted. Psych:  Responds to questions appropriately with a normal affect. Skin: No rashes or lesions noted  Wt Readings from Last 3 Encounters:  02/18/21 174 lb 9.6 oz (79.2 kg)  02/05/21 174 lb 9.6 oz (79.2 kg)  01/02/21 174 lb (78.9 kg)     Studies/Labs Reviewed:   EKG:  EKG is not ordered today.  Recent Labs: 06/06/2020: ALT 20; B Natriuretic Peptide 1,241.0 06/07/2020: Platelets 210; TSH 1.673 11/26/2020: BUN 41; Creatinine, Ser 4.30; Hemoglobin  10.2; Potassium 4.3; Sodium 143   Lipid Panel    Component Value Date/Time   CHOL 167 05/02/2020 1535   TRIG 277 (H) 05/02/2020 1535   HDL 34 (L) 05/02/2020 1535   CHOLHDL 4.9 (H) 05/02/2020 1535   CHOLHDL 4.8 03/28/2019 1056   VLDL 14 10/02/2016 0821   LDLCALC 87 05/02/2020 1535   LDLCALC 136 (H) 03/28/2019 1056    Additional studies/ records that were reviewed today include:   Echocardiogram: 09/2020 IMPRESSIONS     1. Poor acoustic windows limit study Endocardium is difficult to  visualize Consider limited echo with Definity to define wall motion, LVEF  better Compared to echo from March 2022, LVEF is mildly improved. . Left  ventricular ejection fraction, by  estimation, is 45%%. The left ventricle has mildly decreased function.  There is mild left ventricular hypertrophy. Left ventricular diastolic  parameters are consistent with Grade III diastolic dysfunction  (restrictive). Elevated left atrial pressure.   2. Right ventricular systolic function is mildly reduced. The right  ventricular size is normal. There is mildly elevated pulmonary artery  systolic pressure.   3. Left atrial size was mildly dilated.   4. Right atrial size was mildly dilated.   5. Mild mitral valve regurgitation.   6. Tricuspid valve regurgitation is mild to moderate.   7. The aortic valve is normal in structure. Aortic valve regurgitation is  not visualized.   8. Pulmonic valve regurgitation is moderate.   9. The inferior vena cava is normal in size with greater than 50%  respiratory variability, suggesting right atrial pressure of 3 mmHg.   Assessment:    1. Chronic systolic heart failure ([place])   2. Essential hypertension   3. Mixed hyperlipidemia   4. ESRD (end stage renal disease) ([place])      Plan:   In order of problems listed above:  1. HFrEF - Her EF was previously  at 30 to 35% in 05/2020, improved to 45% by echocardiogram in 09/2020. She is not overly active at baseline but  denies any recent chest pain or dyspnea on exertion when doing her routine activities. Medical therapy has been limited in the setting of her progressive CKD and she was recently started on dialysis. At this time, she remains on Hydralazine 50 mg TID, Imdur 30 mg daily and Toprol-XL 50 mg daily. Will defer her diuretic regimen to Nephrology and she is currently on Lasix 80 mg in AM/40 mg in PM. No ACE-I/ARB/ARNI/Spiro given her ESRD. I did not further titrate her medical therapy today given some issues with hypotension during dialysis. Would anticipate obtaining a follow-up echocardiogram next year for reassessment.  2. HTN - Her blood pressure is well controlled at 130/78 during today's visit but she does report some hypotension during dialysis. Continue current regimen for now with Hydralazine, Imdur and Toprol-XL. If hypotension continues to be an issue, may need to hold her medications on the mornings of dialysis.  3. HLD - Followed by her PCP. FLP in 04/2020 showed total cholesterol 167, triglycerides 277, HDL 34 and LDL 87. She remains on Crestor 10 mg daily.  4. ESRD - Followed by Dr. Theador Hawthorne. Recently started on HD.    Medication Adjustments/Labs and Tests Ordered: Current medicines are reviewed at length with the patient today.  Concerns regarding medicines are outlined above.  Medication changes, Labs and Tests ordered today are listed in the Patient Instructions below. Patient Instructions  Medication Instructions:  Your physician recommends that you continue on your current medications as directed. Please refer to the Current Medication list given to you today.  *If you need a refill on your cardiac medications before your next appointment, please call your pharmacy*   Lab Work: NONE   If you have labs (blood work) drawn today and your tests are completely normal, you will receive your results only by: [place] (if you have MyChart) OR A paper copy in the mail If you  have any lab test that is abnormal or we need to change your treatment, we will call you to review the results.   Testing/Procedures: NONE    Follow-Up: At [facility], you and your health needs are our priority.  As part of our continuing mission to provide you with exceptional heart care, we have created designated Provider Care Teams.  These Care Teams include your primary Cardiologist (physician) and Advanced Practice Providers (APPs -  Physician Assistants and Nurse Practitioners) who all work together to provide you with the care you need, when you need it.  We recommend signing up for the patient portal called "MyChart".  Sign up information is provided on this After Visit Summary.  MyChart is used to connect with patients for Virtual Visits (Telemedicine).  Patients are able to view lab/test results, encounter notes, upcoming appointments, etc.  Non-urgent messages can be sent to your provider as well.   To learn more about what you can do with MyChart, go to [email or web address].    Your next appointment:   3 -4 month(s)  The format for your next appointment:   In Person  Provider:   Carlyle Dolly, MD or Bernerd Pho, PA-C{   Other Instructions Thank you for choosing [facility]!     Signed, Erma Heritage, PA-C  02/18/2021 5:06 PM    [facility] [address] [place], [place] [address] Phone: [phone] Fax: [phone]

## 2021-02-18 NOTE — Patient Instructions (Signed)
Medication Instructions:  Your physician recommends that you continue on your current medications as directed. Please refer to the Current Medication list given to you today.  *If you need a refill on your cardiac medications before your next appointment, please call your pharmacy*   Lab Work: NONE   If you have labs (blood work) drawn today and your tests are completely normal, you will receive your results only by: [place] (if you have MyChart) OR A paper copy in the mail If you have any lab test that is abnormal or we need to change your treatment, we will call you to review the results.   Testing/Procedures: NONE    Follow-Up: At [facility], you and your health needs are our priority.  As part of our continuing mission to provide you with exceptional heart care, we have created designated Provider Care Teams.  These Care Teams include your primary Cardiologist (physician) and Advanced Practice Providers (APPs -  Physician Assistants and Nurse Practitioners) who all work together to provide you with the care you need, when you need it.  We recommend signing up for the patient portal called "MyChart".  Sign up information is provided on this After Visit Summary.  MyChart is used to connect with patients for Virtual Visits (Telemedicine).  Patients are able to view lab/test results, encounter notes, upcoming appointments, etc.  Non-urgent messages can be sent to your provider as well.   To learn more about what you can do with MyChart, go to [email or web address].    Your next appointment:   3 -4 month(s)  The format for your next appointment:   In Person  Provider:   Carlyle Dolly, MD or Bernerd Pho, PA-C{   Other Instructions Thank you for choosing [facility]!

## 2021-02-19 DIAGNOSIS — Z992 Dependence on renal dialysis: Secondary | ICD-10-CM | POA: Diagnosis not present

## 2021-02-19 DIAGNOSIS — N186 End stage renal disease: Secondary | ICD-10-CM | POA: Diagnosis not present

## 2021-02-19 DIAGNOSIS — E1122 Type 2 diabetes mellitus with diabetic chronic kidney disease: Secondary | ICD-10-CM | POA: Diagnosis not present

## 2021-02-20 ENCOUNTER — Other Ambulatory Visit: Payer: Self-pay | Admitting: *Deleted

## 2021-02-20 NOTE — Patient Instructions (Signed)
Visit Information  Ms. [name]  - as a part of your Medicaid benefit, you are eligible for care management and care coordination services at no cost or copay. I was unable to reach you by phone today but would be happy to help you with your health related needs. Please feel free to call me @ [phone].   A member of the Managed Medicaid care management team will reach out to you again over the next 14 days.   [name] RN, BSN [place]  [employer] manager

## 2021-02-20 NOTE — Patient Outreach (Signed)
Care Coordination  02/20/2021  CHANDELL ATTRIDGE 02-09-1961 [id number]   Medicaid Managed Care   Unsuccessful Outreach Note  02/20/2021 Name: Latoya Walsh MRN: [id number] DOB: 1960-08-13  Referred by: [name], MD Reason for referral : High Risk Managed Medicaid (Unsuccessful RNCM follow up telephone outreach)   An unsuccessful telephone outreach was attempted today. The patient was referred to the case management team for assistance with care management and care coordination.   Follow Up Plan: The care management team will reach out to the patient again over the next 14 days.   [name] RN, BSN [place]  [employer] manager

## 2021-02-28 ENCOUNTER — Other Ambulatory Visit: Payer: Self-pay

## 2021-02-28 ENCOUNTER — Other Ambulatory Visit: Payer: Self-pay | Admitting: *Deleted

## 2021-02-28 NOTE — Patient Outreach (Signed)
Medicaid Managed Care   Nurse Care Manager Note  02/28/2021 Name:  Latoya Walsh MRN:  [id number] DOB:  08-Aug-1960  Latoya Walsh is an 60 y.o. year old female who is Walsh primary patient of Latoya Helper, MD.  The [facility] Managed Care Coordination team was consulted for assistance with:    CHF DMII  [name] was given information about Medicaid Managed Care Coordination team services today. Latoya Walsh Patient agreed to services and verbal consent obtained.  Engaged with patient by telephone for follow up visit in response to provider referral for case management and/or care coordination services.   Assessments/Interventions:  Review of past medical history, allergies, medications, health status, including review of consultants reports, laboratory and other test data, was performed as part of comprehensive evaluation and provision of chronic care management services.  SDOH (Social Determinants of Health) assessments and interventions performed: SDOH Interventions    Flowsheet Row Most Recent Value  SDOH Interventions   Depression Interventions/Treatment  --  [Referral to MM LCSW, Latoya Walsh]       Care Plan  Allergies  Allergen Reactions   Penicillins Itching and Rash    Medications Reviewed Today     Reviewed by Latoya Montane, RN (Registered Nurse) on 02/28/21 at 1039  Med List Status: <None>   Medication Order Taking? Sig Documenting Provider Last Dose Status Informant  ACCU-CHEK GUIDE test strip [id number] Yes  [provider] Taking Active   Accu-Chek Softclix Lancets lancets [id number] Yes 2 (two) times daily. [provider] Taking Active Multiple Informants  amLODipine (NORVASC) 5 MG tablet [id number] Yes TAKE ONE TABLET BY MOUTH ONCE DAILY. Latoya Helper, MD Taking Active Multiple Informants           Med Note Latoya Walsh, Latoya Walsh   Fri Feb 28, 2021 10:33 AM)    blood glucose meter kit and supplies [id number] Yes Dispense based on  patient and insurance preference. Use to monitor glucose twice daily. Latoya Romp, NP Taking Active Multiple Informants  calcitRIOL (ROCALTROL) 0.25 MCG capsule [id number] Yes Take 0.5 mcg by mouth daily. [provider] Taking Active Multiple Informants           Med Note (Latoya Walsh   Thu Dec 19, 2020  1:12 PM) 9/27-dose change to two tablets daily  furosemide (LASIX) 80 MG tablet [id number] Yes Take 80 mg  (1 tablet) in the morning and 40 mg (half tablet) in the afternoon. Latoya Lenis, MD Taking Active Multiple Informants  gabapentin (NEURONTIN) 100 MG capsule [id number] Yes TAKE ONE CAPSULE BY MOUTH THREE TIMES DAILY Latoya Helper, MD Taking Active   glipiZIDE (GLUCOTROL XL) 5 MG 24 hr tablet [id number] No TAKE ONE TABLET BY MOUTH EVERY MORNING WITH BREAKFAST  Patient not taking: Reported on 02/18/2021   Latoya Spar, MD Not Taking Active   hydrALAZINE (APRESOLINE) 50 MG tablet [id number] Yes TAKE ONE TABLET BY MOUTH EVERY 8 HOURS  Patient taking differently: Take 50 mg by mouth 3 (three) times daily.   Latoya Spar, MD Taking Active   insulin glargine (LANTUS SOLOSTAR) 100 UNIT/ML Solostar Pen [id number] Yes Inject 10 Units into the skin at bedtime. Latoya Romp, NP Taking Active            Med Note (Latoya Walsh   Fri Feb 28, 2021 10:39 AM) 11/17-increased to 20 units at bedtime  Insulin Pen Needle (PEN NEEDLES) 31G X 5 MM MISC [id number] Yes Use to  inject insulin once daily Latoya Romp, NP Taking Active Multiple Informants  isosorbide mononitrate (IMDUR) 30 MG 24 hr tablet [id number]  Take 1 tablet (30 mg total) by mouth daily. Latoya Lenis, MD  Expired 02/18/21 2359 Multiple Informants           Med Note Latoya Walsh   Thu Jan 02, 2021 10:26 AM) Needs new rx  metoprolol succinate (TOPROL-XL) 50 MG 24 hr tablet [id number]  Take 1 tablet (50 mg total) by mouth daily. Take with or immediately following Walsh meal. Latoya Lenis, MD   Expired 02/18/21 2359 Multiple Informants           Med Note Latoya Walsh   Thu Jan 02, 2021 10:26 AM) Needs new rx  mirtazapine (REMERON) 15 MG tablet [id number] Yes TAKE (1) TABLET BY MOUTH AT BEDTIME.  Patient taking differently: Take 15 mg by mouth at bedtime.   Latoya Helper, MD Taking Active   oxyCODONE-acetaminophen (PERCOCET) 5-325 MG tablet [id number] No Take 1 tablet by mouth every 6 (six) hours as needed for severe pain.  Patient not taking: Reported on 02/28/2021   Latoya Posner, MD Not Taking Active            Med Note Latoya Walsh   Thu Jan 02, 2021 10:26 AM) Need refill  oxyCODONE-acetaminophen (PERCOCET) 5-325 MG tablet [id number] No Take 1 tablet by mouth every 6 (six) hours as needed for severe pain.  Patient not taking: Reported on 02/28/2021   Latoya Posner, MD Not Taking Active   rosuvastatin (CRESTOR) 10 MG tablet [id number] Yes Take 1 tablet (10 mg total) by mouth daily. Latoya Lenis, MD Taking Active Multiple Informants  sevelamer carbonate (RENVELA) 800 MG tablet [id number] Yes Take 800 mg by mouth 3 (three) times daily with meals. [provider] Taking Active Multiple Informants           Med Note (Latoya Walsh   Thu Dec 19, 2020  1:13 PM) This medication to be delivered today 12/19/20  sodium bicarbonate 650 MG tablet [id number] Yes Take 650 mg by mouth 3 (three) times daily. [provider] Taking Active Multiple Informants  Med List Note Latoya Walsh, [place] 35/32/99 2426):              Patient Active Problem List   Diagnosis Date Noted   ESRD (end stage renal disease) ([place]) 01/06/2021   CHF (congestive heart failure) ([place]) 06/06/2020   Leg swelling 04/28/2020   Hyperlipidemia LDL goal <100 04/28/2020   Left hand pain 03/10/2020   Anemia 02/19/2020   Depression, major, single episode, in partial remission ([place]) 01/25/2019   Headache 11/13/2018   Insomnia 09/25/2018   Screening for colorectal cancer 07/23/2016    Lumbar back pain with radiculopathy affecting right lower extremity 03/27/2015   Non compliance with medical treatment 07/22/2014   Chronic hepatitis C without hepatic coma ([place]) 12/04/2013   Allergic rhinitis 11/03/2012   Severe hypertension 06/25/2011   Anxiety and depression 02/05/2011   Nicotine dependence 11/01/2010   Drug dependence, continuous abuse ([place]) 03/22/2010   Uncontrolled type 2 diabetes mellitus with hyperglycemia ([place]) 06/02/2007   Alcohol abuse 06/02/2007    Conditions to be addressed/monitored per PCP order:  CHF and DMII  Care Plan : RN Case Manager Plan of Care  Updates made by Kadisha Montane, RN since 02/28/2021 12:00 AM     Problem: Knowledge Deficits and Care Coordination needs related to long  term management of DMII and HF   Priority: High     Long-Range Goal: Development of Plan of Care to address Care Coordination Needs and Knowledge deficits related to DMII and HF   Start Date: 02/06/2021  Expected End Date: 05/07/2021  Priority: High  Note:   Current Barriers:  Knowledge Deficits related to plan of care for management of CHF and DMII  Care Coordination needs related to Lack of essential utilities - heating*  Film/video editor.  Literacy barriers  RNCM Clinical Goal(s):  Patient will verbalize understanding of plan for management of CHF and DMII as evidenced by patient verbalization of and self monitoring activity take all medications exactly as prescribed and will call provider for medication related questions as evidenced by documentation in EMR    attend all scheduled medical appointments:  12/20 with Rayetta Pigg, NP for diabetes management as evidenced by physician notes in EMR        demonstrate improved adherence to prescribed treatment plan for CHF and DMII as evidenced by physician documentation in EMR continue to work with RN Care Manager and/or Social Worker to address care management and care coordination needs related to CHF and  DMII as evidenced by adherence to CM Team Scheduled appointments     work with pharmacist to address medication management related to CHF and DMII as evidenced by review of EMR and patient or pharmacist report    through collaboration with Consulting civil engineer, provider, and care team.   Interventions: Inter-disciplinary care team collaboration (see longitudinal plan of care) Evaluation of current treatment plan related to  self management and patient's adherence to plan as established by provider MM LCSW referral placed for assistance with Mental Health concerns-patient feeling down and stressed   Heart Failure Interventions:  (Status: Goal on Track (progressing): YES.)  Long Term Goal  Advised patient to weigh each morning after emptying bladder Discussed importance of daily weight and advised patient to weigh and record daily Reviewed role of diuretics in prevention of fluid overload and management of heart failure Discussed the importance of keeping all appointments with provider Screening for signs and symptoms of depression related to chronic disease state  Assessed social determinant of health barriers  Diabetes:  (Status: Goal on Track (progressing): YES.) Long Term Goal   Lab Results  Component Value Date   HGBA1C 8.5 ([place]) 09/17/2020   HGBA1C 8.5 09/17/2020   HGBA1C 8.5 ([place]als[facility]) 09/17/2020   HGBA1C 8.5 ([place]) 09/17/2020  Assessed patient's understanding of A1c goal: <7% Reviewed medications with patient and discussed importance of medication adherence;        Reviewed prescribed diet with patient provided examples of healthy options; Counseled on importance of regular laboratory monitoring as prescribed;        Reviewed scheduled/upcoming provider appointments including: 12/20 with Endocrinology;         Advised patient, providing education and rationale, to check cbg twice daily and when you have symptoms of low or high blood sugar and record        call provider for findings outside  established parameters;       Referral made to social work team for assistance with Mental Health;      Review of patient status, including review of consultants reports, relevant laboratory and other test results, and medications completed;       Screening for signs and symptoms of depression related to chronic disease state;        Assessed social determinant of health barriers;  Patient Goals/Self-Care Activities: Patient will self administer medications as prescribed as evidenced by self report/primary caregiver report  Patient will attend all scheduled provider appointments as evidenced by clinician review of documented attendance to scheduled appointments and patient/caregiver report Patient will call pharmacy for medication refills as evidenced by patient report and review of pharmacy fill history as appropriate Patient will attend church or other social activities as evidenced by patient report Patient will call provider office for new concerns or questions as evidenced by review of documented incoming telephone call notes and patient report call office if I gain more than 2 pounds in one day or 5 pounds in one week keep legs up while sitting use salt in moderation weigh myself daily eat more whole grains, fruits and vegetables, lean meats and healthy fats dress right for the weather, hot or cold check blood sugar at prescribed times: twice daily before breakfast and before bed  enter blood sugar readings and medication or insulin into daily log take the blood sugar log to all doctor visits take the blood sugar meter to all doctor visits fill half of plate with vegetables switch to sugar-free drinks Take all medications to all doctor appointments       Follow Up:  Patient agrees to Care Plan and Follow-up.  Plan: The Managed Medicaid care management team will reach out to the patient again over the next 30 days.  Date/time of next scheduled RN care management/care  coordination outreach:  04/01/2021 @ 10:30am  [name] RN, BSN [place] RN Care Coordinator

## 2021-02-28 NOTE — Patient Instructions (Signed)
Visit Information  Latoya Walsh was given information about Medicaid Managed Care team care coordination services as a part of their [place] Medicaid benefit. Latoya Walsh verbally consented to engagement with the [facility] Managed Care team.   If you are experiencing a medical emergency, please call 911 or report to your local emergency department or urgent care.   If you have a non-emergency medical problem during routine business hours, please contact your provider's office and ask to speak with a nurse.   For questions related to your [facility], please call: [phone] or visit the homepage here: [email or web address]  If you would like to schedule transportation through your [facility], please call the following number at least 2 days in advance of your appointment: [phone].   Call the [place] at [phone], at any time, 24 hours a day, 7 days a week. If you are in danger or need immediate medical attention call 911.  If you would like help to quit smoking, call 1-800-QUIT-NOW 9852399985) OR Espaol: 1-855-Djelo-Ya (8-299-371-6967) o para ms informacin haga clic aqu or Text READY to 200-400 to register via text  [name] - following are the goals we discussed in your visit today:   Goals Addressed   None     Please see education materials related to heart care and stress reduction provided as print materials.   The patient verbalized understanding of instructions provided today and agreed to receive a mailed copy of patient instruction and/or educational materials.  Telephone follow up appointment with Managed Medicaid care management team member scheduled for:04/01/21 @ 10:30am  [name] RN, BSN [place] RN Care Coordinator   Following is a copy of your plan of care:  Care  Plan : RN Case Manager Plan of Care  Updates made by Sashay Montane, RN since 02/28/2021 12:00 AM     Problem: Knowledge Deficits and Care Coordination needs related to long term management of DMII and HF   Priority: High     Long-Range Goal: Development of Plan of Care to address Care Coordination Needs and Knowledge deficits related to DMII and HF   Start Date: 02/06/2021  Expected End Date: 05/07/2021  Priority: High  Note:   Current Barriers:  Knowledge Deficits related to plan of care for management of CHF and DMII  Care Coordination needs related to Lack of essential utilities - heating*  Film/video editor.  Literacy barriers  RNCM Clinical Goal(s):  Patient will verbalize understanding of plan for management of CHF and DMII as evidenced by patient verbalization of and self monitoring activity take all medications exactly as prescribed and will call provider for medication related questions as evidenced by documentation in EMR    attend all scheduled medical appointments:  12/20 with Rayetta Pigg, NP for diabetes management as evidenced by physician notes in EMR        demonstrate improved adherence to prescribed treatment plan for CHF and DMII as evidenced by physician documentation in EMR continue to work with RN Care Manager and/or Social Worker to address care management and care coordination needs related to CHF and DMII as evidenced by adherence to CM Team Scheduled appointments     work with pharmacist to address medication management related to CHF and DMII as evidenced by review of EMR and patient or pharmacist report    through collaboration with Consulting civil engineer, provider, and care team.  Interventions: Inter-disciplinary care team collaboration (see longitudinal plan of care) Evaluation of current treatment plan related to  self management and patient's adherence to plan as established by provider MM LCSW referral placed for assistance with Mental Health  concerns-patient feeling down and stressed   Heart Failure Interventions:  (Status: Goal on Track (progressing): YES.)  Long Term Goal  Advised patient to weigh each morning after emptying bladder Discussed importance of daily weight and advised patient to weigh and record daily Reviewed role of diuretics in prevention of fluid overload and management of heart failure Discussed the importance of keeping all appointments with provider Screening for signs and symptoms of depression related to chronic disease state  Assessed social determinant of health barriers  Diabetes:  (Status: Goal on Track (progressing): YES.) Long Term Goal   Lab Results  Component Value Date   HGBA1C 8.5 (A) 09/17/2020   HGBA1C 8.5 09/17/2020   HGBA1C 8.5 (A) 09/17/2020   HGBA1C 8.5 (A) 09/17/2020  Assessed patient's understanding of A1c goal: <7% Reviewed medications with patient and discussed importance of medication adherence;        Reviewed prescribed diet with patient provided examples of healthy options; Counseled on importance of regular laboratory monitoring as prescribed;        Reviewed scheduled/upcoming provider appointments including: 12/20 with Endocrinology;         Advised patient, providing education and rationale, to check cbg twice daily and when you have symptoms of low or high blood sugar and record        call provider for findings outside established parameters;       Referral made to social work team for assistance with Mental Health;      Review of patient status, including review of consultants reports, relevant laboratory and other test results, and medications completed;       Screening for signs and symptoms of depression related to chronic disease state;        Assessed social determinant of health barriers;         Patient Goals/Self-Care Activities: Patient will self administer medications as prescribed as evidenced by self report/primary caregiver report  Patient will attend  all scheduled provider appointments as evidenced by clinician review of documented attendance to scheduled appointments and patient/caregiver report Patient will call pharmacy for medication refills as evidenced by patient report and review of pharmacy fill history as appropriate Patient will attend church or other social activities as evidenced by patient report Patient will call provider office for new concerns or questions as evidenced by review of documented incoming telephone call notes and patient report call office if I gain more than 2 pounds in one day or 5 pounds in one week keep legs up while sitting use salt in moderation weigh myself daily eat more whole grains, fruits and vegetables, lean meats and healthy fats dress right for the weather, hot or cold check blood sugar at prescribed times: twice daily before breakfast and before bed  enter blood sugar readings and medication or insulin into daily log take the blood sugar log to all doctor visits take the blood sugar meter to all doctor visits fill half of plate with vegetables switch to sugar-free drinks Take all medications to all doctor appointments

## 2021-03-02 ENCOUNTER — Other Ambulatory Visit: Payer: Self-pay | Admitting: Internal Medicine

## 2021-03-02 DIAGNOSIS — I10 Essential (primary) hypertension: Secondary | ICD-10-CM

## 2021-03-04 ENCOUNTER — Telehealth: Payer: Self-pay | Admitting: Family Medicine

## 2021-03-04 ENCOUNTER — Telehealth: Payer: Self-pay | Admitting: *Deleted

## 2021-03-04 DIAGNOSIS — T8249XA Other complication of vascular dialysis catheter, initial encounter: Secondary | ICD-10-CM | POA: Diagnosis not present

## 2021-03-04 DIAGNOSIS — N186 End stage renal disease: Secondary | ICD-10-CM | POA: Diagnosis not present

## 2021-03-04 DIAGNOSIS — Z992 Dependence on renal dialysis: Secondary | ICD-10-CM | POA: Diagnosis not present

## 2021-03-04 NOTE — Telephone Encounter (Signed)
.. °  Medicaid Managed Care   Unsuccessful Outreach Note  03/04/2021 Name: Latoya Walsh MRN: [id number] DOB: 02-Jan-1961  Referred by: [name], MD Reason for referral : High Risk Managed Medicaid (I called the patient today to get her scheduled for a phone visit with the LCSW. She did not answer her phone and the VM was not set up.)   An unsuccessful telephone outreach was attempted today. The patient was referred to the case management team for assistance with care management and care coordination.   Follow Up Plan: The care management team will reach out to the patient again over the next 7 days.   [place]

## 2021-03-04 NOTE — Telephone Encounter (Signed)
   Telephone encounter was:  Successful.  03/04/2021 Name: Latoya Walsh MRN: [id number] DOB: Feb 08, 1961  Latoya Walsh is a 60 y.o. year old female who is a primary care patient of [name], Norwood Levo, MD . The community resource team was consulted for assistance with patient says she has no needs at this time , heating is fine and there are no other community resource needs  Care guide performed the following interventions: Patient provided with information about care guide support team and interviewed to confirm resource needs Follow up call placed to community resources to determine status of patients referral.  Follow Up Plan:  No further follow up planned at this time. The patient has been provided with needed resources.  [place], Care Management  [phone] [address] , [place] [address] Email : [email or web address]

## 2021-03-10 ENCOUNTER — Telehealth: Payer: Self-pay | Admitting: Licensed Clinical Social Worker

## 2021-03-10 NOTE — Telephone Encounter (Signed)
LCSW received an update from [facility] who shares that pt has been evicted due to property disarray/disruptive behavior. Pt has been staying with her son and daughter. Pt calendar with her appointments was in the property which she is not able to access at this time. LCSW provided a copy of her appointments securely to [facility] via email at [email or web address]. I will also reach out to [name] with [name] team w/ the county to see if coordinated entry/VISPDAT completed.   [name], MSW, [name]  [phone]- work cell phone (preferred) [phone]- desk phone

## 2021-03-11 ENCOUNTER — Ambulatory Visit (INDEPENDENT_AMBULATORY_CARE_PROVIDER_SITE_OTHER): Payer: Medicaid Other | Admitting: Nurse Practitioner

## 2021-03-11 ENCOUNTER — Other Ambulatory Visit: Payer: Self-pay

## 2021-03-11 ENCOUNTER — Encounter: Payer: Self-pay | Admitting: Nurse Practitioner

## 2021-03-11 VITALS — BP 128/65 | HR 84 | Ht 67.0 in | Wt 176.8 lb

## 2021-03-11 DIAGNOSIS — E1165 Type 2 diabetes mellitus with hyperglycemia: Secondary | ICD-10-CM | POA: Diagnosis not present

## 2021-03-11 DIAGNOSIS — I10 Essential (primary) hypertension: Secondary | ICD-10-CM

## 2021-03-11 DIAGNOSIS — E782 Mixed hyperlipidemia: Secondary | ICD-10-CM

## 2021-03-11 LAB — POCT GLYCOSYLATED HEMOGLOBIN (HGB A1C): HbA1c, POC (controlled diabetic range): 9.4 % — AB (ref 0.0–7.0)

## 2021-03-11 MED ORDER — LANTUS SOLOSTAR 100 UNIT/ML ~~LOC~~ SOPN: 35.0000 [IU] | PEN_INJECTOR | Freq: Every day | SUBCUTANEOUS | 1 refills | Status: DC

## 2021-03-11 MED ORDER — ACCU-CHEK GUIDE VI STRP: ORAL_STRIP | 11 refills | Status: DC

## 2021-03-11 NOTE — Progress Notes (Signed)
Endocrinology Follow Up Note       03/11/2021, 2:09 PM   Subjective:    Patient ID: Latoya Walsh, female    DOB: [birth year].  Latoya Walsh is being seen in follow up after being seen in consultation for management of currently uncontrolled symptomatic diabetes requested by  [name], MD.   Past Medical History:  Diagnosis Date   Allergic rhinitis    Anxiety    CHF (congestive heart failure) ([place])    a. EF 30-35% by echo in 05/2020 b. EF at 45% in 09/2020   Chronic back pain    Chronic hepatitis C without hepatic coma ([place])    COVID-19 virus infection 12/26/2019   Depression    Diabetes mellitus    Hepatitis C    Hypertension    Noncompliance    Poor appetite 07/17/2014   Substance abuse ([place])    HX of drug use and alcohol use    Past Surgical History:  Procedure Laterality Date   APPENDECTOMY  2004   AV FISTULA PLACEMENT Left 11/26/2020   Procedure: LEFT ARM ARTERIOVENOUS (AV) FISTULA CREATION;  Surgeon: [name], MD;  Location: [facility];  Service: Vascular;  Laterality: Left;   IR FLUORO GUIDE CV LINE RIGHT  12/25/2020   IR US GUIDE VASC ACCESS RIGHT  12/25/2020    Social History   Socioeconomic History   Marital status: Single    Spouse name: Not on file   Number of children: 3   Years of education: Not on file   Highest education level: Not on file  Occupational History   Occupation: Disabled  Tobacco Use   Smoking status: Light Smoker    Packs/day: 0.25    Types: Cigarettes   Smokeless tobacco: Never  Vaping Use   Vaping Use: Never used  Substance and Sexual Activity   Alcohol use: Not Currently    Alcohol/week: 40.0 standard drinks    Types: 40 Cans of beer per week    Comment: 2 16oz cans of beer a day   Drug use: Not Currently    Types: Marijuana, Cocaine    Comment: Last smoked marijuana yesterday 10/18/14   Sexual activity: Yes    Birth  control/protection: Condom  Other Topics Concern   Not on file  Social History Narrative   Not on file   Social Determinants of Health   Financial Resource Strain: Low Risk    Difficulty of Paying Living Expenses: Not hard at all  Food Insecurity: Food Insecurity Present   Worried About Charity fundraiser in the Last Year: Sometimes true   Arboriculturist in the Last Year: Sometimes true  Transportation Needs: Unmet Transportation Needs   Lack of Transportation (Medical): Yes   Lack of Transportation (Non-Medical): No  Physical Activity: Inactive   Days of Exercise per Week: 0 days   Minutes of Exercise per Session: 0 min  Stress: Not on file  Social Connections: Moderately Integrated   Frequency of Communication with Friends and Family: More than three times a week  Frequency of Social Gatherings with Friends and Family: Once a week   Attends Religious Services: More than 4 times per year   Active Member of [employer] or Organizations: Yes   Attends Music therapist: More than 4 times per year   Marital Status: Separated    Family History  Problem Relation Age of Onset   Diabetes Sister        Twin - AIDS     Outpatient Encounter Medications as of 03/11/2021  Medication Sig   Accu-Chek Softclix Lancets lancets 2 (two) times daily.   amLODipine (NORVASC) 10 MG tablet Take 10 mg by mouth daily.   blood glucose meter kit and supplies Dispense based on patient and insurance preference. Use to monitor glucose twice daily.   calcitRIOL (ROCALTROL) 0.25 MCG capsule Take 0.5 mcg by mouth daily.   Cholecalciferol (VITAMIN D3) 25 MCG (1000 UT) CAPS Take 1 capsule by mouth daily.   furosemide (LASIX) 80 MG tablet Take 80 mg  (1 tablet) in the morning and 40 mg (half tablet) in the afternoon.   gabapentin (NEURONTIN) 100 MG capsule TAKE ONE CAPSULE BY MOUTH THREE TIMES DAILY   hydrALAZINE (APRESOLINE) 50 MG tablet TAKE ONE TABLET BY MOUTH THREE TIMES DAILY   Insulin Pen  Needle (PEN NEEDLES) 31G X 5 MM MISC Use to inject insulin once daily   mirtazapine (REMERON) 15 MG tablet TAKE (1) TABLET BY MOUTH AT BEDTIME. (Patient taking differently: Take 15 mg by mouth at bedtime.)   rosuvastatin (CRESTOR) 10 MG tablet Take 1 tablet (10 mg total) by mouth daily.   sevelamer carbonate (RENVELA) 800 MG tablet Take 800 mg by mouth 3 (three) times daily with meals.   sodium bicarbonate 650 MG tablet Take 650 mg by mouth 3 (three) times daily.   [DISCONTINUED] ACCU-CHEK GUIDE test strip    [DISCONTINUED] insulin glargine (LANTUS SOLOSTAR) 100 UNIT/ML Solostar Pen Inject 10 Units into the skin at bedtime. (Patient taking differently: Inject 20 Units into the skin at bedtime.)   ACCU-CHEK GUIDE test strip Use to check glucose twice daily   insulin glargine (LANTUS SOLOSTAR) 100 UNIT/ML Solostar Pen Inject 35 Units into the skin at bedtime.   isosorbide mononitrate (IMDUR) 30 MG 24 hr tablet Take 1 tablet (30 mg total) by mouth daily.   metoprolol succinate (TOPROL-XL) 50 MG 24 hr tablet Take 1 tablet (50 mg total) by mouth daily. Take with or immediately following a meal.   [DISCONTINUED] amLODipine (NORVASC) 5 MG tablet TAKE ONE TABLET BY MOUTH ONCE DAILY. (Patient not taking: Reported on 03/11/2021)   [DISCONTINUED] glipiZIDE (GLUCOTROL XL) 5 MG 24 hr tablet TAKE ONE TABLET BY MOUTH EVERY MORNING WITH BREAKFAST (Patient not taking: Reported on 02/18/2021)   [DISCONTINUED] oxyCODONE-acetaminophen (PERCOCET) 5-325 MG tablet Take 1 tablet by mouth every 6 (six) hours as needed for severe pain. (Patient not taking: Reported on 02/28/2021)   [DISCONTINUED] oxyCODONE-acetaminophen (PERCOCET) 5-325 MG tablet Take 1 tablet by mouth every 6 (six) hours as needed for severe pain. (Patient not taking: Reported on 02/28/2021)   [DISCONTINUED] sevelamer carbonate (RENVELA) 0.8 g PACK packet Take 0.8 g by mouth 3 (three) times daily. (Patient not taking: Reported on 03/11/2021)   No  facility-administered encounter medications on file as of 03/11/2021.    ALLERGIES: Allergies  Allergen Reactions   Penicillins Itching and Rash    VACCINATION STATUS: Immunization History  Administered Date(s) Administered   H1N1 04/04/2008   Influenza Split 02/05/2011, 12/02/2011   Influenza Whole  12/10/2006, 01/08/2009   Influenza,inj,Quad PF,6+ Mos 02/12/2014, 03/27/2015, 03/05/2020   PFIZER(Purple Top)SARS-COV-2 Vaccination 09/29/2019   Pneumococcal Conjugate-13 06/11/2014   Pneumococcal Polysaccharide-23 12/05/2004, 10/30/2010   Td 08/09/2003    Diabetes She presents for her follow-up diabetic visit. She has type 2 diabetes mellitus. Onset time: Diagnosed at approx age of 7. Her disease course has been worsening. There are no hypoglycemic associated symptoms. Pertinent negatives for hypoglycemia include no nervousness/anxiousness, sweats or tremors. Associated symptoms include blurred vision and fatigue. Pertinent negatives for diabetes include no polyuria and no weight loss. There are no hypoglycemic complications. Symptoms are stable. Diabetic complications include heart disease and nephropathy. Risk factors for coronary artery disease include diabetes mellitus, dyslipidemia, hypertension, sedentary lifestyle and tobacco exposure. Current diabetic treatment includes insulin injections. She is compliant with treatment most of the time (stopped taking Glipizide in between visits for reports of hypoglycemia.). Her weight is fluctuating minimally. She is following a generally unhealthy diet. When asked about meal planning, she reported none. She has not had a previous visit with a dietitian. She rarely participates in exercise. Her home blood glucose trend is increasing steadily. Her breakfast blood glucose range is generally >200 mg/dl. Her overall blood glucose range is >200 mg/dl. (She presents today with her meter and logs showing worsening glycemic profile overall.  Her POCT A1c  today is 9.4% increasing from last visit of 8.5%.  She called several times in between appts for complaints of high or low glucose readings.  She does case manager attempting to help manage her chronic conditions given her memory impairment/cognitive impairment.  Analysis of her meter shows 7-day average of 297, 14-day average of 332, 30-day average of 354 and 90-day average of 323.  She denies any hypoglycemia.) An ACE inhibitor/angiotensin II receptor blocker is being taken. She does not see a podiatrist.Eye exam is not current.  Hypertension This is a chronic problem. The current episode started more than 1 year ago. The problem has been gradually improving since onset. The problem is controlled. Associated symptoms include blurred vision. Pertinent negatives include no sweats. There are no associated agents to hypertension. Risk factors for coronary artery disease include diabetes mellitus, dyslipidemia, family history, sedentary lifestyle and smoking/tobacco exposure. Past treatments include calcium channel blockers, diuretics, beta blockers and direct vasodilators. The current treatment provides moderate improvement. Compliance problems include diet, exercise and psychosocial issues.  Hypertensive end-organ damage includes kidney disease and heart failure. Identifiable causes of hypertension include chronic renal disease.  Hyperlipidemia This is a chronic problem. The current episode started more than 1 year ago. The problem is uncontrolled. Recent lipid tests were reviewed and are variable. Exacerbating diseases include chronic renal disease and diabetes. Factors aggravating her hyperlipidemia include beta blockers, fatty foods and smoking. Current antihyperlipidemic treatment includes statins. The current treatment provides mild improvement of lipids. Compliance problems include adherence to diet, adherence to exercise and psychosocial issues.  Risk factors for coronary artery disease include diabetes  mellitus, dyslipidemia, family history, hypertension and a sedentary lifestyle.    Review of systems  Constitutional: + Minimally fluctuating body weight,  current Body mass index is 27.69 kg/m. , no fatigue, no subjective hyperthermia, no subjective hypothermia Eyes: no blurry vision, no xerophthalmia ENT: no sore throat, no nodules palpated in throat, no dysphagia/odynophagia, no hoarseness Cardiovascular: no chest pain, no shortness of breath, no palpitations, no leg swelling Respiratory: no cough, no shortness of breath Gastrointestinal: no nausea/vomiting/diarrhea Musculoskeletal: no muscle/joint aches Skin: no rashes, no hyperemia Neurological: no tremors,  no numbness, no tingling, no dizziness Psychiatric: no depression, no anxiety  Objective:     BP 128/65    Pulse 84    Ht 5' 7" (1.702 m)    Wt 176 lb 12.8 oz (80.2 kg)    LMP 06/23/2010    SpO2 98%    BMI 27.69 kg/m   Wt Readings from Last 3 Encounters:  03/11/21 176 lb 12.8 oz (80.2 kg)  02/18/21 174 lb 9.6 oz (79.2 kg)  02/05/21 174 lb 9.6 oz (79.2 kg)     BP Readings from Last 3 Encounters:  03/11/21 128/65  02/18/21 130/78  02/05/21 (!) 149/71       Physical Exam- Limited  Constitutional:  Body mass index is 27.69 kg/m. , not in acute distress, reluctant state of mind, ? Memory impairment vs cognitive impairment Eyes:  EOMI, no exophthalmos Neck: Supple Cardiovascular: RRR, no murmurs, rubs, or gallops, no edema Respiratory: Adequate breathing efforts, no crackles, rales, rhonchi, or wheezing Musculoskeletal: no gross deformities, strength intact in all four extremities, no gross restriction of joint movements Skin:  no rashes, no hyperemia Neurological: no tremor with outstretched hands   CMP ( most recent) CMP     Component Value Date/Time   NA 143 11/26/2020 1054   NA 144 05/02/2020 1535   K 4.3 11/26/2020 1054   CL 111 11/26/2020 1054   CO2 24 06/10/2020 0501   GLUCOSE 174 (H) 11/26/2020 1054    BUN 41 (H) 11/26/2020 1054   BUN 38 (H) 05/02/2020 1535   CREATININE 4.30 (H) 11/26/2020 1054   CREATININE 1.49 (H) 03/28/2019 1056   CALCIUM 8.2 (L) 06/10/2020 0501   PROT 6.1 (L) 06/06/2020 1447   PROT 6.2 05/02/2020 1535   ALBUMIN 2.7 (L) 06/06/2020 1447   ALBUMIN 3.3 (L) 05/02/2020 1535   AST 26 06/06/2020 1447   ALT 20 06/06/2020 1447   ALT 65 (H) 12/04/2013 1449   ALKPHOS 87 06/06/2020 1447   BILITOT 0.5 06/06/2020 1447   BILITOT <0.2 05/02/2020 1535   BILITOT 0.3 12/04/2013 1449   GFRNONAA 19 (L) 06/10/2020 0501   GFRNONAA 39 (L) 03/28/2019 1056   GFRAA 31 (L) 05/02/2020 1535   GFRAA 45 (L) 03/28/2019 1056     Diabetic Labs (most recent): Lab Results  Component Value Date   HGBA1C 9.4 (A) 03/11/2021   HGBA1C 8.5 (A) 09/17/2020   HGBA1C 8.5 09/17/2020   HGBA1C 8.5 (A) 09/17/2020   HGBA1C 8.5 (A) 09/17/2020     Lipid Panel ( most recent) Lipid Panel     Component Value Date/Time   CHOL 167 05/02/2020 1535   TRIG 277 (H) 05/02/2020 1535   HDL 34 (L) 05/02/2020 1535   CHOLHDL 4.9 (H) 05/02/2020 1535   CHOLHDL 4.8 03/28/2019 1056   VLDL 14 10/02/2016 0821   LDLCALC 87 05/02/2020 1535   LDLCALC 136 (H) 03/28/2019 1056   LABVLDL 46 (H) 05/02/2020 1535      Lab Results  Component Value Date   TSH 1.673 06/07/2020   TSH 2.160 01/05/2020   TSH 2.32 09/27/2018   TSH 1.99 10/02/2016   TSH 1.238 04/22/2015   TSH 1.419 02/19/2014   TSH 2.567 07/11/2012   TSH 2.052 10/30/2010   TSH 1.170 09/14/2008   TSH 1.172 09/16/2007           Assessment & Plan:   1) Uncontrolled type 2 diabetes mellitus with hyperglycemia ([place])  She presents today with her meter and logs showing worsening glycemic profile  overall.  Her POCT A1c today is 9.4% increasing from last visit of 8.5%.  She called several times in between appts for complaints of high or low glucose readings.  She does case manager attempting to help manage her chronic conditions given her memory  impairment/cognitive impairment.  Analysis of her meter shows 7-day average of 297, 14-day average of 332, 30-day average of 354 and 90-day average of 323.  She denies any hypoglycemia.  - Latoya Walsh has currently uncontrolled symptomatic type 2 DM since 60 years of age.  -Recent labs reviewed.  - I had a long discussion with her about the progressive nature of diabetes and the pathology behind its complications. -her diabetes is complicated by noncompliance, CHF, CKD stage 4, ETOH abuse and she remains at a high risk for more acute and chronic complications which include CAD, CVA, CKD, retinopathy, and neuropathy. These are all discussed in detail with her.  - Nutritional counseling repeated at each appointment due to patients tendency to fall back in to old habits.  - The patient admits there is a room for improvement in their diet and drink choices. -  Suggestion is made for the patient to avoid simple carbohydrates from their diet including Cakes, Sweet Desserts / Pastries, Ice Cream, Soda (diet and regular), Sweet Tea, Candies, Chips, Cookies, Sweet Pastries, Store Bought Juices, Alcohol in Excess of 1-2 drinks a day, Artificial Sweeteners, Coffee Creamer, and "Sugar-free" Products. This will help patient to have stable blood glucose profile and potentially avoid unintended weight gain.   - I encouraged the patient to switch to unprocessed or minimally processed complex starch and increased protein intake (animal or plant source), fruits, and vegetables.   - Patient is advised to stick to a routine mealtimes to eat 3 meals a day and avoid unnecessary snacks (to snack only to correct hypoglycemia).  - I have approached her with the following individualized plan to manage her diabetes and patient agrees:   -Give her gross hyperglycemia, she is advised to increase her Lantus to 35 units SQ nightly.    -she is encouraged to continue CONSISTENTLY monitoring blood glucose twice daily,  before breakfast and before bed, and to call the clinic if she has readings less than 70 or above 300 for 3 tests in a row.   - she is warned not to take insulin without proper monitoring per orders. - Adjustment parameters are given to her for hypo and hyperglycemia in writing.  - she is not a candidate for Metformin or SGLT2i due to concurrent renal insufficiency.  - she is not a good candidate for incretin therapy due to body habitus.  - Specific targets for  A1c; LDL, HDL, and Triglycerides were discussed with the patient.  2) Blood Pressure /Hypertension:  her blood pressure is controlled to target.   she is advised to continue her current medications including Norvasc 5 mg p.o. daily with breakfast, Lasix 40 mg po daily, Hydralazine 50 mg po TID, and Losartan 25 mg po daily.  Will defer med changes to nephrology.  3) Lipids/Hyperlipidemia:    Review of her recent lipid panel from 05/02/20 showed controlled LDL at 87 with elevated triglycerides of 277 .  she is advised to continue Crestor 10 mg daily at bedtime.  Side effects and precautions discussed with her.  Will recheck lipid panel prior to next visit.  4)  Weight/Diet:  her Body mass index is 27.69 kg/m.  -    she is not a candidate  for major weight loss.  Exercise, and detailed carbohydrates information provided  -  detailed on discharge instructions.  5) Chronic Care/Health Maintenance: -she is on ACEI/ARB and Statin medications and is encouraged to initiate and continue to follow up with Ophthalmology, Dentist, Podiatrist at least yearly or according to recommendations, and advised to [place]. I have recommended yearly flu vaccine and pneumonia vaccine at least every 5 years; moderate intensity exercise for up to 150 minutes weekly; and sleep for at least 7 hours a day.  - she is advised to maintain close follow up with [name], MD for primary care needs, as well as her other providers for optimal and coordinated  care.      I spent 40 minutes in the care of the patient today including review of labs from [place], Lipids, Thyroid Function, Hematology (current and previous including abstractions from other facilities); face-to-face time discussing  her blood glucose readings/logs, discussing hypoglycemia and hyperglycemia episodes and symptoms, medications doses, her options of short and long term treatment based on the latest standards of care / guidelines;  discussion about incorporating lifestyle medicine;  and documenting the encounter.    Please refer to Patient Instructions for Blood Glucose Monitoring and Insulin/Medications Dosing Guide"  in media tab for additional information. Please  also refer to " Patient Self Inventory" in the Media  tab for reviewed elements of pertinent patient history.  [name] participated in the discussions, expressed understanding, and voiced agreement with the above plans.  All questions were answered to her satisfaction. she is encouraged to contact clinic should she have any questions or concerns prior to her return visit.     Follow up plan: - Return in about 3 months (around 06/09/2021) for Diabetes F/U with A1c in office, Previsit labs, Bring meter and logs.   Rayetta Pigg, [facility] [address] [place], [place] [address] Phone: [phone] Fax: [phone]  03/11/2021, 2:09 PM

## 2021-03-11 NOTE — Patient Instructions (Signed)

## 2021-03-12 ENCOUNTER — Other Ambulatory Visit: Payer: Self-pay | Admitting: Internal Medicine

## 2021-03-12 DIAGNOSIS — E1140 Type 2 diabetes mellitus with diabetic neuropathy, unspecified: Secondary | ICD-10-CM

## 2021-03-18 ENCOUNTER — Other Ambulatory Visit: Payer: Self-pay

## 2021-03-18 NOTE — Patient Instructions (Signed)
[name] ,   [facility] Managed Care Team is available to provide assistance to you with your healthcare needs at no cost and as a benefit of your [facility] plan.  Thank you,   Eula Fried, BSW, MSW, [employer] Managed Medicaid [email or web address] [email or web address] Phone: [phone]

## 2021-03-18 NOTE — Patient Outreach (Signed)
[facility]) Care Management  03/18/2021  MARIBELLE HOPPLE 07-22-1960 [id number]  LCSW completed [facility] outreach attempt today during scheduled appointment time and patient successfully answered but stated that she is unable to complete her scheduled [facility] LCSW session today because her twin sister is in the hospital currently and needs to focus on this family emergency. LCSW will ask Scheduling Care Guide to reschedule [facility] SW appointment with patient.   Eula Fried, BSW, MSW, [employer] Managed Medicaid [email or web address] Phone: [phone]

## 2021-03-22 DIAGNOSIS — N186 End stage renal disease: Secondary | ICD-10-CM | POA: Diagnosis not present

## 2021-03-22 DIAGNOSIS — Z992 Dependence on renal dialysis: Secondary | ICD-10-CM | POA: Diagnosis not present

## 2021-03-22 DIAGNOSIS — E1122 Type 2 diabetes mellitus with diabetic chronic kidney disease: Secondary | ICD-10-CM | POA: Diagnosis not present

## 2021-03-23 DIAGNOSIS — N179 Acute kidney failure, unspecified: Secondary | ICD-10-CM | POA: Diagnosis not present

## 2021-03-24 DIAGNOSIS — N179 Acute kidney failure, unspecified: Secondary | ICD-10-CM | POA: Diagnosis not present

## 2021-03-25 ENCOUNTER — Telehealth: Payer: Self-pay | Admitting: Pharmacist

## 2021-03-25 ENCOUNTER — Ambulatory Visit: Payer: Self-pay

## 2021-03-25 DIAGNOSIS — N179 Acute kidney failure, unspecified: Secondary | ICD-10-CM | POA: Diagnosis not present

## 2021-03-25 NOTE — Patient Outreach (Signed)
03/25/2021 Name: PUNEET SELDEN MRN: [id number] DOB: 11-09-1960  Referred by: [name], MD Reason for referral : High Risk Managed Medicaid   A successful outreach to patient was conducted today. Patient requested we reschedule appointment as she reports not feeling well.   Follow Up Plan: Appointment rescheduled for 04/22/21 at 11:00 AM   [name] PharmD, CPP High Risk Managed [facility]: [facility] [phone]

## 2021-03-26 DIAGNOSIS — N179 Acute kidney failure, unspecified: Secondary | ICD-10-CM | POA: Diagnosis not present

## 2021-03-27 DIAGNOSIS — N179 Acute kidney failure, unspecified: Secondary | ICD-10-CM | POA: Diagnosis not present

## 2021-03-28 DIAGNOSIS — N179 Acute kidney failure, unspecified: Secondary | ICD-10-CM | POA: Diagnosis not present

## 2021-03-29 DIAGNOSIS — N179 Acute kidney failure, unspecified: Secondary | ICD-10-CM | POA: Diagnosis not present

## 2021-03-30 DIAGNOSIS — N179 Acute kidney failure, unspecified: Secondary | ICD-10-CM | POA: Diagnosis not present

## 2021-03-31 DIAGNOSIS — R070 Pain in throat: Secondary | ICD-10-CM | POA: Diagnosis not present

## 2021-03-31 DIAGNOSIS — N179 Acute kidney failure, unspecified: Secondary | ICD-10-CM | POA: Diagnosis not present

## 2021-04-01 ENCOUNTER — Other Ambulatory Visit: Payer: Self-pay | Admitting: Family Medicine

## 2021-04-01 ENCOUNTER — Other Ambulatory Visit: Payer: Self-pay | Admitting: Internal Medicine

## 2021-04-01 ENCOUNTER — Other Ambulatory Visit: Payer: Self-pay | Admitting: Nurse Practitioner

## 2021-04-01 ENCOUNTER — Other Ambulatory Visit: Payer: Self-pay | Admitting: *Deleted

## 2021-04-01 DIAGNOSIS — M79642 Pain in left hand: Secondary | ICD-10-CM

## 2021-04-01 DIAGNOSIS — N179 Acute kidney failure, unspecified: Secondary | ICD-10-CM | POA: Diagnosis not present

## 2021-04-01 DIAGNOSIS — F5104 Psychophysiologic insomnia: Secondary | ICD-10-CM

## 2021-04-01 NOTE — Patient Outreach (Signed)
Care Coordination  04/01/2021  BRITTEN PARADY 04-15-1960 [id number]   Medicaid Managed Care   Unsuccessful Outreach Note  04/01/2021 Name: KIMIYA BRUNELLE MRN: [id number] DOB: March 09, 1961  Referred by: [name], MD Reason for referral : High Risk Managed Medicaid (Unsuccessful RNCM follow up telephone outreach)   An unsuccessful telephone outreach was attempted today. The patient was referred to the case management team for assistance with care management and care coordination.   Follow Up Plan: The care management team will reach out to the patient again over the next 14 days.   [name] RN, BSN [place] RN Care Coordinator

## 2021-04-01 NOTE — Patient Instructions (Signed)
Visit Information  Ms. [name]  - as a part of your Medicaid benefit, you are eligible for care management and care coordination services at no cost or copay. I was unable to reach you by phone today but would be happy to help you with your health related needs. Please feel free to call me @ [phone].   A member of the Managed Medicaid care management team will reach out to you again over the next 14 days.   [name] RN, BSN [place] RN Care Coordinator

## 2021-04-02 DIAGNOSIS — N179 Acute kidney failure, unspecified: Secondary | ICD-10-CM | POA: Diagnosis not present

## 2021-04-03 DIAGNOSIS — N179 Acute kidney failure, unspecified: Secondary | ICD-10-CM | POA: Diagnosis not present

## 2021-04-04 DIAGNOSIS — N179 Acute kidney failure, unspecified: Secondary | ICD-10-CM | POA: Diagnosis not present

## 2021-04-05 DIAGNOSIS — N179 Acute kidney failure, unspecified: Secondary | ICD-10-CM | POA: Diagnosis not present

## 2021-04-06 DIAGNOSIS — N179 Acute kidney failure, unspecified: Secondary | ICD-10-CM | POA: Diagnosis not present

## 2021-04-07 ENCOUNTER — Other Ambulatory Visit: Payer: Self-pay | Admitting: *Deleted

## 2021-04-07 DIAGNOSIS — N179 Acute kidney failure, unspecified: Secondary | ICD-10-CM | POA: Diagnosis not present

## 2021-04-07 NOTE — Patient Outreach (Signed)
Care Coordination  04/07/2021  Latoya Walsh 31-May-1960 [id number]   Medicaid Managed Care   Unsuccessful Outreach Note  04/07/2021 Name: Latoya Walsh MRN: [id number] DOB: 29-Sep-1960  Referred by: [name], MD Reason for referral : High Risk Managed Medicaid (Unsuccessful RNCM follow up telephone outreach)   A second unsuccessful telephone outreach was attempted today. The patient was referred to the case management team for assistance with care management and care coordination.   Follow Up Plan: A HIPAA compliant phone message was left for the patient providing contact information and requesting a return call.   [name] RN, BSN [place] RN Care Coordinator

## 2021-04-07 NOTE — Patient Instructions (Signed)
Visit Information  Ms. [name]  - as a part of your Medicaid benefit, you are eligible for care management and care coordination services at no cost or copay. I was unable to reach you by phone today but would be happy to help you with your health related needs. Please feel free to call me @ [phone].   A member of the Managed Medicaid care management team will reach out to you again over the next 14 days.   [name] RN, BSN [place] RN Care Coordinator

## 2021-04-08 DIAGNOSIS — N179 Acute kidney failure, unspecified: Secondary | ICD-10-CM | POA: Diagnosis not present

## 2021-04-09 DIAGNOSIS — N179 Acute kidney failure, unspecified: Secondary | ICD-10-CM | POA: Diagnosis not present

## 2021-04-10 DIAGNOSIS — N179 Acute kidney failure, unspecified: Secondary | ICD-10-CM | POA: Diagnosis not present

## 2021-04-11 DIAGNOSIS — N179 Acute kidney failure, unspecified: Secondary | ICD-10-CM | POA: Diagnosis not present

## 2021-04-12 DIAGNOSIS — N179 Acute kidney failure, unspecified: Secondary | ICD-10-CM | POA: Diagnosis not present

## 2021-04-13 DIAGNOSIS — N179 Acute kidney failure, unspecified: Secondary | ICD-10-CM | POA: Diagnosis not present

## 2021-04-14 DIAGNOSIS — N179 Acute kidney failure, unspecified: Secondary | ICD-10-CM | POA: Diagnosis not present

## 2021-04-15 ENCOUNTER — Other Ambulatory Visit: Payer: Self-pay | Admitting: Internal Medicine

## 2021-04-15 DIAGNOSIS — N179 Acute kidney failure, unspecified: Secondary | ICD-10-CM | POA: Diagnosis not present

## 2021-04-15 DIAGNOSIS — E1140 Type 2 diabetes mellitus with diabetic neuropathy, unspecified: Secondary | ICD-10-CM

## 2021-04-16 ENCOUNTER — Ambulatory Visit: Payer: Medicaid Other | Admitting: Vascular Surgery

## 2021-04-16 ENCOUNTER — Other Ambulatory Visit (HOSPITAL_COMMUNITY): Payer: Self-pay | Admitting: Vascular Surgery

## 2021-04-16 DIAGNOSIS — N179 Acute kidney failure, unspecified: Secondary | ICD-10-CM | POA: Diagnosis not present

## 2021-04-17 DIAGNOSIS — N179 Acute kidney failure, unspecified: Secondary | ICD-10-CM | POA: Diagnosis not present

## 2021-04-18 DIAGNOSIS — N179 Acute kidney failure, unspecified: Secondary | ICD-10-CM | POA: Diagnosis not present

## 2021-04-19 DIAGNOSIS — N179 Acute kidney failure, unspecified: Secondary | ICD-10-CM | POA: Diagnosis not present

## 2021-04-20 DIAGNOSIS — N179 Acute kidney failure, unspecified: Secondary | ICD-10-CM | POA: Diagnosis not present

## 2021-04-21 DIAGNOSIS — N179 Acute kidney failure, unspecified: Secondary | ICD-10-CM | POA: Diagnosis not present

## 2021-04-22 ENCOUNTER — Ambulatory Visit: Payer: Self-pay

## 2021-04-22 ENCOUNTER — Telehealth: Payer: Self-pay | Admitting: Pharmacist

## 2021-04-22 DIAGNOSIS — Z992 Dependence on renal dialysis: Secondary | ICD-10-CM | POA: Diagnosis not present

## 2021-04-22 DIAGNOSIS — N179 Acute kidney failure, unspecified: Secondary | ICD-10-CM | POA: Diagnosis not present

## 2021-04-22 DIAGNOSIS — N186 End stage renal disease: Secondary | ICD-10-CM | POA: Diagnosis not present

## 2021-04-22 DIAGNOSIS — E1122 Type 2 diabetes mellitus with diabetic chronic kidney disease: Secondary | ICD-10-CM | POA: Diagnosis not present

## 2021-04-22 NOTE — Patient Outreach (Signed)
04/22/2021 Name: Latoya Walsh MRN: [id number] DOB: May 06, 1960  Referred by: [name], MD Reason for referral : No chief complaint on file.   Spoke with patient on the phone and she stated she was no longer currently interested in the Managed Medicaid Services   Follow Up Plan: Patient will inform PCP if she decides at any time to re-establish with Managed Medicaid team.    [name] PharmD, CPP High Risk Managed [facility] [phone]

## 2021-04-23 ENCOUNTER — Other Ambulatory Visit: Payer: Self-pay

## 2021-04-23 ENCOUNTER — Other Ambulatory Visit: Payer: Medicaid Other | Admitting: *Deleted

## 2021-04-23 DIAGNOSIS — N179 Acute kidney failure, unspecified: Secondary | ICD-10-CM | POA: Diagnosis not present

## 2021-04-23 NOTE — Patient Outreach (Addendum)
Care Coordination  04/23/2021  Latoya Walsh 06-Sep-1960 [id number]  Latoya Walsh was referred to the [facility] Managed Care High Risk team for assistance with care coordination and care management services. Care coordination/care management services as part of the Medicaid benefit was offered to the patient. The patient declined assistance offered.    Plan: The Medicaid Managed Care High Risk team is available at any time in the future to assist with care coordination/care management services upon referral.   [name] RN, BSN [place] RN Care Coordinator

## 2021-04-23 NOTE — Patient Instructions (Signed)
Ms. Kulik,  Thank you for taking time to speak with our team about care coordination and care management services available to you at no cost as part of your Medicaid benefit. These services are voluntary. Our team is available to provide assistance regarding your health care needs at any time. Please do not hesitate to reach out to me if we can be of service to you at any time in the future. [place], BSN [place] RN Care Coordinator

## 2021-04-24 DIAGNOSIS — N179 Acute kidney failure, unspecified: Secondary | ICD-10-CM | POA: Diagnosis not present

## 2021-04-25 DIAGNOSIS — N179 Acute kidney failure, unspecified: Secondary | ICD-10-CM | POA: Diagnosis not present

## 2021-04-26 DIAGNOSIS — N179 Acute kidney failure, unspecified: Secondary | ICD-10-CM | POA: Diagnosis not present

## 2021-04-27 DIAGNOSIS — N179 Acute kidney failure, unspecified: Secondary | ICD-10-CM | POA: Diagnosis not present

## 2021-04-28 DIAGNOSIS — N179 Acute kidney failure, unspecified: Secondary | ICD-10-CM | POA: Diagnosis not present

## 2021-04-29 DIAGNOSIS — N179 Acute kidney failure, unspecified: Secondary | ICD-10-CM | POA: Diagnosis not present

## 2021-04-30 DIAGNOSIS — N179 Acute kidney failure, unspecified: Secondary | ICD-10-CM | POA: Diagnosis not present

## 2021-05-01 ENCOUNTER — Telehealth: Payer: Self-pay | Admitting: Licensed Clinical Social Worker

## 2021-05-01 DIAGNOSIS — N179 Acute kidney failure, unspecified: Secondary | ICD-10-CM | POA: Diagnosis not present

## 2021-05-01 NOTE — Telephone Encounter (Signed)
Incoming text from [place] paramedic. She is asking for updated appointment list for Latoya Walsh. I also updated her current mailing address (she has been staying with her daughter). I have emailed this to [place] at this time. I remain available.   Westley Hummer, MSW, [place]  [phone]- work cell phone (preferred) [phone]- desk phone

## 2021-05-02 DIAGNOSIS — N179 Acute kidney failure, unspecified: Secondary | ICD-10-CM | POA: Diagnosis not present

## 2021-05-03 DIAGNOSIS — N179 Acute kidney failure, unspecified: Secondary | ICD-10-CM | POA: Diagnosis not present

## 2021-05-04 DIAGNOSIS — N179 Acute kidney failure, unspecified: Secondary | ICD-10-CM | POA: Diagnosis not present

## 2021-05-05 DIAGNOSIS — N179 Acute kidney failure, unspecified: Secondary | ICD-10-CM | POA: Diagnosis not present

## 2021-05-06 DIAGNOSIS — N179 Acute kidney failure, unspecified: Secondary | ICD-10-CM | POA: Diagnosis not present

## 2021-05-07 ENCOUNTER — Ambulatory Visit (INDEPENDENT_AMBULATORY_CARE_PROVIDER_SITE_OTHER): Payer: Medicaid Other | Admitting: Family Medicine

## 2021-05-07 ENCOUNTER — Other Ambulatory Visit (HOSPITAL_COMMUNITY): Payer: Self-pay

## 2021-05-07 ENCOUNTER — Encounter: Payer: Self-pay | Admitting: Family Medicine

## 2021-05-07 ENCOUNTER — Other Ambulatory Visit: Payer: Self-pay

## 2021-05-07 VITALS — BP 157/80 | HR 85 | Ht 67.0 in | Wt 177.1 lb

## 2021-05-07 DIAGNOSIS — E785 Hyperlipidemia, unspecified: Secondary | ICD-10-CM

## 2021-05-07 DIAGNOSIS — E1165 Type 2 diabetes mellitus with hyperglycemia: Secondary | ICD-10-CM | POA: Diagnosis not present

## 2021-05-07 DIAGNOSIS — I10 Essential (primary) hypertension: Secondary | ICD-10-CM

## 2021-05-07 DIAGNOSIS — F419 Anxiety disorder, unspecified: Secondary | ICD-10-CM | POA: Diagnosis not present

## 2021-05-07 DIAGNOSIS — N179 Acute kidney failure, unspecified: Secondary | ICD-10-CM | POA: Diagnosis not present

## 2021-05-07 DIAGNOSIS — F32A Depression, unspecified: Secondary | ICD-10-CM | POA: Diagnosis not present

## 2021-05-07 DIAGNOSIS — F4321 Adjustment disorder with depressed mood: Secondary | ICD-10-CM | POA: Diagnosis not present

## 2021-05-07 DIAGNOSIS — F17218 Nicotine dependence, cigarettes, with other nicotine-induced disorders: Secondary | ICD-10-CM | POA: Diagnosis not present

## 2021-05-07 MED ORDER — METOPROLOL SUCCINATE ER 50 MG PO TB24
50.0000 mg | ORAL_TABLET | Freq: Every day | ORAL | 3 refills | Status: DC
  Filled 2021-05-07: qty 30, 30d supply, fill #0

## 2021-05-07 MED ORDER — ISOSORBIDE MONONITRATE ER 30 MG PO TB24
30.0000 mg | ORAL_TABLET | Freq: Every day | ORAL | 5 refills | Status: DC
  Filled 2021-05-07: qty 30, 30d supply, fill #0

## 2021-05-07 NOTE — Patient Instructions (Signed)
F/U in  3 months, call if you need me before  Nurse please arrange cologuard test for colon screen  You are referred for toenails to be cut by podiatry  Please stop smoking  Thanks for choosing [facility], we consider it a privelige to serve you.

## 2021-05-07 NOTE — Assessment & Plan Note (Signed)
Latoya Walsh is reminded of the importance of commitment to daily physical activity for 30 minutes or more, as able and the need to limit carbohydrate intake to 30 to 60 grams per meal to help with blood sugar control.   The need to take medication as prescribed, test blood sugar as directed, and to call between visits if there is a concern that blood sugar is uncontrolled is also discussed.   Latoya Walsh is reminded of the importance of daily foot exam, annual eye examination, and good blood sugar, blood pressure and cholesterol control.  Diabetic Labs Latest Ref Rng & Units 03/11/2021 11/26/2020 09/17/2020 09/17/2020 09/17/2020  HbA1c 0.0 - 7.0 % 9.4(A) - 8.5(A) 8.5(A) 8.5  Microalbumin mg/dL - - - - -  Micro/Creat Ratio 0.0 - 30.0 mg/g - - - - -  Chol 100 - 199 mg/dL - - - - -  HDL >39 mg/dL - - - - -  Calc LDL 0 - 99 mg/dL - - - - -  Triglycerides 0 - 149 mg/dL - - - - -  Creatinine 0.44 - 1.00 mg/dL - 4.30(H) - - -   BP/Weight 05/07/2021 03/11/2021 02/18/2021 02/05/2021 01/02/2021 01/01/2021 42/05/9530  Systolic BP 023 343 568 616 837 290 211  Diastolic BP 80 65 78 71 70 72 70  Wt. (Lbs) 177.12 176.8 174.6 174.6 174 175 175  BMI 27.74 27.69 27.35 27.35 27.25 28.25 27.41   Foot/eye exam completion dates Latest Ref Rng & Units 05/07/2021 09/17/2020  Eye Exam No Retinopathy - No Retinopathy  Foot Form Completion - Done -

## 2021-05-08 DIAGNOSIS — N179 Acute kidney failure, unspecified: Secondary | ICD-10-CM | POA: Diagnosis not present

## 2021-05-09 ENCOUNTER — Other Ambulatory Visit: Payer: Self-pay | Admitting: Vascular Surgery

## 2021-05-09 ENCOUNTER — Other Ambulatory Visit: Payer: Self-pay | Admitting: Nurse Practitioner

## 2021-05-09 ENCOUNTER — Other Ambulatory Visit (HOSPITAL_COMMUNITY): Payer: Self-pay | Admitting: Vascular Surgery

## 2021-05-09 ENCOUNTER — Other Ambulatory Visit: Payer: Self-pay | Admitting: Family Medicine

## 2021-05-09 DIAGNOSIS — E1140 Type 2 diabetes mellitus with diabetic neuropathy, unspecified: Secondary | ICD-10-CM

## 2021-05-09 DIAGNOSIS — N179 Acute kidney failure, unspecified: Secondary | ICD-10-CM | POA: Diagnosis not present

## 2021-05-09 DIAGNOSIS — Z48812 Encounter for surgical aftercare following surgery on the circulatory system: Secondary | ICD-10-CM

## 2021-05-10 DIAGNOSIS — N179 Acute kidney failure, unspecified: Secondary | ICD-10-CM | POA: Diagnosis not present

## 2021-05-12 ENCOUNTER — Encounter: Payer: Self-pay | Admitting: Family Medicine

## 2021-05-12 DIAGNOSIS — N179 Acute kidney failure, unspecified: Secondary | ICD-10-CM | POA: Diagnosis not present

## 2021-05-12 NOTE — Assessment & Plan Note (Signed)
Hyperlipidemia:Low fat diet discussed and encouraged.   Lipid Panel  Lab Results  Component Value Date   CHOL 167 05/02/2020   HDL 34 (L) 05/02/2020   LDLCALC 87 05/02/2020   TRIG 277 (H) 05/02/2020   CHOLHDL 4.9 (H) 05/02/2020   Updated lab needed at/ before next visit.

## 2021-05-12 NOTE — Assessment & Plan Note (Addendum)
Increased in light of recent loss. Would benefit from therapy continue remeron,has been followed by mental health in the past will reach out to team to re engage her

## 2021-05-12 NOTE — Progress Notes (Signed)
Latoya Walsh     MRN: [id number]      DOB: [birth year]   HPI [name] is here for follow up and re-evaluation of chronic medical conditions, medication management and review of any available recent lab and radiology data.  Preventive health is updated, specifically  Cancer screening and Immunization.   C/o depresion since losing her twin not suicidal or homicidal C/o blood pressure falling at dialysis  C/o difficulty with vascular access for dialysis andi s being managed by [facility] locally for this Blood sugar remains uncontrolled , needs Endo follow up, nop appt in system  ROS Denies recent fever or chills. C/o fatigue Denies sinus pressure, nasal congestion, ear pain or sore throat. Denies chest congestion, productive cough or wheezing. Denies chest pains, palpitations and leg swelling Denies abdominal pain, nausea, vomiting,diarrhea or constipation.   Denies dysuria, frequency, hesitancy or incontinence. . Denies skin break down or rash.   PE  BP (!) 157/80    Pulse 85    Ht 5\' 7"  (1.702 m)    Wt 177 lb 1.9 oz (80.3 kg)    LMP 06/23/2010    SpO2 96%    BMI 27.74 kg/m   Patient alert and oriented and in no cardiopulmonary distress.  HEENT: No facial asymmetry, EOMI,     Neck supple .  Chest: Clear to auscultation bilaterally.  CVS: S1, S2 no murmurs, no S3.Regular rate.  ABD: Soft non tender.   Ext: No edema  MS: adequate ROM spine, shoulders, hips and knees.  Skin: Intact, no ulcerations or rash noted.  Psych: Good eye contact, normal affect. Memory intact not anxious or depressed appearing.  CNS: CN 2-12 intact, power,  normal throughout.no focal deficits noted.   Assessment & Plan  Uncontrolled type 2 diabetes mellitus with hyperglycemia ([place]) [name] is reminded of the importance of commitment to daily physical activity for 30 minutes or more, as able and the need to limit carbohydrate intake to 30 to 60 grams per meal to help with blood sugar control.    The need to take medication as prescribed, test blood sugar as directed, and to call between visits if there is a concern that blood sugar is uncontrolled is also discussed.   [name] is reminded of the importance of daily foot exam, annual eye examination, and good blood sugar, blood pressure and cholesterol control.  Diabetic Labs Latest Ref Rng & Units 03/11/2021 11/26/2020 09/17/2020 09/17/2020 09/17/2020  HbA1c 0.0 - 7.0 % 9.4(A) - 8.5(A) 8.5(A) 8.5  Microalbumin mg/dL - - - - -  Micro/Creat Ratio 0.0 - 30.0 mg/g - - - - -  Chol 100 - 199 mg/dL - - - - -  HDL >39 mg/dL - - - - -  Calc LDL 0 - 99 mg/dL - - - - -  Triglycerides 0 - 149 mg/dL - - - - -  Creatinine 0.44 - 1.00 mg/dL - 4.30(H) - - -   BP/Weight 05/07/2021 03/11/2021 02/18/2021 02/05/2021 01/02/2021 01/01/2021 19/08/2227  Systolic BP 798 921 194 174 081 448 185  Diastolic BP 80 65 78 71 70 72 70  Wt. (Lbs) 177.12 176.8 174.6 174.6 174 175 175  BMI 27.74 27.69 27.35 27.35 27.25 28.25 27.41   Foot/eye exam completion dates Latest Ref Rng & Units 05/07/2021 09/17/2020  Eye Exam No Retinopathy - No Retinopathy  Foot Form Completion - Done -        Severe hypertension Uncontrolled needs imdur refilled , rx sent  DASH diet and commitment to daily physical activity for a minimum of 30 minutes discussed and encouraged, as a part of hypertension management. The importance of attaining a healthy weight is also discussed.  BP/Weight 05/07/2021 03/11/2021 02/18/2021 02/05/2021 01/02/2021 01/01/2021 70/05/4033  Systolic BP 248 185 909 311 216 244 695  Diastolic BP 80 65 78 71 70 72 70  Wt. (Lbs) 177.12 176.8 174.6 174.6 174 175 175  BMI 27.74 27.69 27.35 27.35 27.25 28.25 27.41       Nicotine dependence Asked:confirms currently smokes cigarettes Assess: Unwilling to set a quit date, Advise: needs to QUIT to reduce risk of cancer, cardio and cerebrovascular disease Assist: counseled for 5 minutes and literature  provided Arrange: follow up in 2 to 4 months   Hyperlipidemia LDL goal <100 Hyperlipidemia:Low fat diet discussed and encouraged.   Lipid Panel  Lab Results  Component Value Date   CHOL 167 05/02/2020   HDL 34 (L) 05/02/2020   LDLCALC 87 05/02/2020   TRIG 277 (H) 05/02/2020   CHOLHDL 4.9 (H) 05/02/2020   Updated lab needed at/ before next visit.     Anxiety and depression Increased in light of recent loss. Would benefit from therapy continue remeron,has been followed by mental health in the past will reach out to team to re engage her

## 2021-05-12 NOTE — Assessment & Plan Note (Signed)
Uncontrolled needs imdur refilled , rx sent DASH diet and commitment to daily physical activity for a minimum of 30 minutes discussed and encouraged, as a part of hypertension management. The importance of attaining a healthy weight is also discussed.  BP/Weight 05/07/2021 03/11/2021 02/18/2021 02/05/2021 01/02/2021 01/01/2021 18/05/3580  Systolic BP 518 984 210 312 811 886 773  Diastolic BP 80 65 78 71 70 72 70  Wt. (Lbs) 177.12 176.8 174.6 174.6 174 175 175  BMI 27.74 27.69 27.35 27.35 27.25 28.25 27.41

## 2021-05-12 NOTE — Assessment & Plan Note (Signed)
Asked:confirms currently smokes cigarettes Assess: Unwilling to set a quit date,  Advise: needs to QUIT to reduce risk of cancer, cardio and cerebrovascular disease Assist: counseled for 5 minutes and literature provided Arrange: follow up in 2 to 4 months  

## 2021-05-13 DIAGNOSIS — N179 Acute kidney failure, unspecified: Secondary | ICD-10-CM | POA: Diagnosis not present

## 2021-05-14 ENCOUNTER — Other Ambulatory Visit: Payer: Self-pay

## 2021-05-14 ENCOUNTER — Encounter: Payer: Self-pay | Admitting: Vascular Surgery

## 2021-05-14 ENCOUNTER — Ambulatory Visit (INDEPENDENT_AMBULATORY_CARE_PROVIDER_SITE_OTHER): Payer: Medicaid Other | Admitting: Vascular Surgery

## 2021-05-14 ENCOUNTER — Ambulatory Visit: Payer: Medicaid Other

## 2021-05-14 DIAGNOSIS — Z992 Dependence on renal dialysis: Secondary | ICD-10-CM

## 2021-05-14 DIAGNOSIS — N186 End stage renal disease: Secondary | ICD-10-CM

## 2021-05-14 DIAGNOSIS — N179 Acute kidney failure, unspecified: Secondary | ICD-10-CM | POA: Diagnosis not present

## 2021-05-14 NOTE — Progress Notes (Signed)
[facility] Specialist of [place]  Patient name: Latoya Walsh MRN: [id number] DOB: 1960/04/18 Sex: female  REASON FOR VISIT: Evaluation for AV access placement  HPI: Latoya Walsh is a 61 y.o. female here today for discussion of AV access.  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  I had a follow-up office visit with her on 02/05/2021.  At that time her fistula was patent but was having poor maturation.  I recommended left arm AV graft versus upper arm fistula.  She was not willing to proceed at that time.  She does have dialysis via a right IJ catheter and reports that she has not had difficulty with this.  Past Medical History:  Diagnosis Date   Allergic rhinitis    Anxiety    CHF (congestive heart failure) ([place])    a. EF 30-35% by echo in 05/2020 b. EF at 45% in 09/2020   Chronic back pain    Chronic hepatitis C without hepatic coma ([place])    COVID-19 virus infection 12/26/2019   Depression    Diabetes mellitus    Hepatitis C    Hypertension    Noncompliance    Poor appetite 07/17/2014   Substance abuse ([place])    HX of drug use and alcohol use    Family History  Problem Relation Age of Onset   Diabetes Sister        Twin - AIDS     SOCIAL HISTORY: Social History   Tobacco Use   Smoking status: Light Smoker    Packs/day: 0.25    Types: Cigarettes   Smokeless tobacco: Never  Substance Use Topics   Alcohol use: Not Currently    Alcohol/week: 40.0 standard drinks    Types: 40 Cans of beer per week    Comment: 2 16oz cans of beer a day    Allergies  Allergen Reactions   Penicillins Itching and Rash    Current Outpatient Medications  Medication Sig Dispense Refill   ACCU-CHEK GUIDE test strip Use to check glucose twice daily 100 each 11   Accu-Chek Softclix Lancets lancets 2 (two) times daily.     amLODipine (NORVASC) 10 MG tablet Take 10 mg by mouth daily.     blood  glucose meter kit and supplies Dispense based on patient and insurance preference. Use to monitor glucose twice daily. 1 each 0   calcitRIOL (ROCALTROL) 0.25 MCG capsule Take 0.5 mcg by mouth daily.     Cholecalciferol (VITAMIN D3) 25 MCG (1000 UT) CAPS Take 1 capsule by mouth daily.     furosemide (LASIX) 80 MG tablet Take 80 mg  (1 tablet) in the morning and 40 mg (half tablet) in the afternoon. 135 tablet 3   gabapentin (NEURONTIN) 100 MG capsule TAKE ONE CAPSULE EVERY MORNING and TAKE ONE TABLET AT NOON and TAKE ONE CAPSULE EVERYDAY AT BEDTIME 90 capsule 1   glipiZIDE (GLUCOTROL XL) 5 MG 24 hr tablet TAKE ONE TABLET BY MOUTH EVERY MORNING 30 tablet 0   hydrALAZINE (APRESOLINE) 50 MG tablet TAKE ONE TABLET BY MOUTH THREE TIMES DAILY 90 tablet 3   Insulin Pen Needle (PEN NEEDLES) 31G X 5 MM MISC Use to inject insulin once daily 90 each 3   isosorbide mononitrate (IMDUR) 30 MG 24 hr tablet Take 1 tablet (30 mg total) by mouth daily. 90 tablet 3   isosorbide mononitrate (IMDUR) 30 MG 24 hr tablet Take 1 tablet (30 mg total)  by mouth daily. 30 tablet 5   LANTUS SOLOSTAR 100 UNIT/ML Solostar Pen INJECT 35 UNITS into THE SKIN AT BEDTIME 15 mL 1   metoprolol succinate (TOPROL-XL) 50 MG 24 hr tablet Take 1 tablet (50 mg total) by mouth daily. Take with or immediately following a meal. 90 tablet 3   metoprolol succinate (TOPROL-XL) 50 MG 24 hr tablet Take 1 tablet (50 mg total) by mouth daily. Take with or immediately following a meal. 30 tablet 3   mirtazapine (REMERON) 15 MG tablet TAKE ONE TABLET BY MOUTH EVERY EVENING 90 tablet 1   rosuvastatin (CRESTOR) 10 MG tablet Take 1 tablet (10 mg total) by mouth daily. 90 tablet 3   sevelamer carbonate (RENVELA) 800 MG tablet Take 800 mg by mouth 3 (three) times daily with meals.     sodium bicarbonate 650 MG tablet Take 650 mg by mouth 3 (three) times daily.     No current facility-administered medications for this visit.    REVIEW OF SYSTEMS:  [X]   denotes positive finding, [ ]  denotes negative finding Cardiac  Comments:  Chest pain or chest pressure:    Shortness of breath upon exertion:    Short of breath when lying flat:    Irregular heart rhythm:        Vascular    Pain in calf, thigh, or hip brought on by ambulation:    Pain in feet at night that wakes you up from your sleep:     Blood clot in your veins:    Leg swelling:           PHYSICAL EXAM: There were no vitals filed for this visit.  GENERAL: The patient is a well-nourished female, in no acute distress. The vital signs are documented above. CARDIOVASCULAR: She has 2+ radial pulses bilaterally.  Her left radiocephalic fistula remains patent but it does remain small with very poor maturation. PULMONARY: There is good air exchange  MUSCULOSKELETAL: There are no major deformities or cyanosis. NEUROLOGIC: No focal weakness or paresthesias are detected. SKIN: There are no ulcers or rashes noted. PSYCHIATRIC: The patient has a normal affect.  DATA:  I imaged her left arm veins with SonoSite ultrasound.  She does not have a large upper arm cephalic vein or basilic vein  MEDICAL ISSUES: I again discussed options with the patient.  Explained the danger of long-term use of her tunneled hemodialysis catheter with risk for infection.  I have recommended new left arm access.  I explained that in all likelihood this would be a left upper arm graft but would reimage with SonoSite to determine if she is upper arm fistula candidate.  She does report chronic pain in her left arm.  She reports this is from her shoulder down throughout her upper arm and forearm.  Explained that this would be extremely unlikely that this is related to her radiocephalic fistula.  She is willing to proceed with outpatient surgery at [facility] on 05/27/2021    [name], MD FACS [facility] [phone]  Note: Portions of this report may have  been transcribed using voice recognition software.  Every effort has been made to ensure accuracy; however, inadvertent computerized transcription errors may still be present.

## 2021-05-15 ENCOUNTER — Other Ambulatory Visit (HOSPITAL_COMMUNITY): Payer: Self-pay

## 2021-05-15 ENCOUNTER — Other Ambulatory Visit: Payer: Self-pay

## 2021-05-15 DIAGNOSIS — N179 Acute kidney failure, unspecified: Secondary | ICD-10-CM | POA: Diagnosis not present

## 2021-05-15 MED ORDER — INSULIN GLARGINE 100 UNIT/ML SOLOSTAR PEN: 35.0000 [IU] | PEN_INJECTOR | Freq: Every day | SUBCUTANEOUS | 1 refills | Status: DC

## 2021-05-16 ENCOUNTER — Other Ambulatory Visit: Payer: Self-pay | Admitting: *Deleted

## 2021-05-16 DIAGNOSIS — N179 Acute kidney failure, unspecified: Secondary | ICD-10-CM | POA: Diagnosis not present

## 2021-05-16 NOTE — Patient Instructions (Signed)
Visit Information  Latoya Walsh was given information about Medicaid Managed Care team care coordination services as a part of their [place] Medicaid benefit. Latoya Walsh verbally consented to engagement with the [facility] Managed Care team.   If you are experiencing a medical emergency, please call 911 or report to your local emergency department or urgent care.   If you have a non-emergency medical problem during routine business hours, please contact your provider's office and ask to speak with a nurse.   For questions related to your [facility], please call: [phone] or visit the homepage here: [email or web address]  If you would like to schedule transportation through your [facility], please call the following number at least 2 days in advance of your appointment: [phone].  Rides for urgent appointments can also be made after hours by calling Member Services.  Call the [place] at [phone], at any time, 24 hours a day, 7 days a week. If you are in danger or need immediate medical attention call 911.  If you would like help to quit smoking, call 1-800-QUIT-NOW 403-640-7402) OR Espaol: 1-855-Djelo-Ya (7-262-035-5974) o para ms informacin haga clic aqu or Text READY to 200-400 to register via text  [name] - following are the goals we discussed in your visit today:   Goals Addressed             This Visit's Progress    Begin and Stick with Counseling-Depression       Timeframe:  Long-Range Goal Priority:  Medium Start Date:    05/16/21                         Expected End Date:     11/13/21                  Follow Up Date 05/20/22    - check out counseling - keep 90 percent of counseling appointments - schedule counseling appointment    Why is this important?   Beating depression may take some time.  If  you don't feel better right away, don't give up on your treatment plan.    Notes:          Patient verbalizes understanding of instructions and care plan provided today and agrees to view in [place]. Active MyChart status confirmed with patient.     [place],  Managed [facility] Social Worker [phone]   Following is a copy of your plan of care:  Care Plan : General Social Work (Adult)  Updates made by Latoya Claude, LCSW since 05/16/2021 12:00 AM     Problem: CHL AMB "PATIENT-SPECIFIC PROBLEM"   Note:   CARE PLAN ENTRY (see longitudinal plan of care for additional care plan information)  Current Barriers:  Knowledge deficits related to accessing mental health provider in patient with Depression  Patient is experiencing symptoms of  depression which seem to be exacerbated by the loss of her sister.     Patient needs Support, Education, and Care Coordination in order to meet unmet mental health needs  Mental Health Concerns   Clinical Social Work Goal(s):  Over the next 90 days, patient will work with SW bi-weekly by telephone or in person to reduce or manage symptoms of depression until connected for ongoing counseling resources.  Patient will implement clinical interventions discussed today to decreases symptoms of depression and increase knowledge and/or ability of: coping skills.  Interventions:  Assessed patient's understanding, education, previous treatment and care coordination needs  Patient interviewed and appropriate assessments performed: PHQ 2 PHQ 9 Patient discussed the recent loss of her sister Provided basic mental health support, education and interventions  Grief response normalized, emotional support provided, grief counseling encouraged Collaborated with appropriate clinical care team members regarding patient's needs through co-signature Discussed  options for long term counseling based on need and insurance. Patient agreeable to ongoing  mental health counseling Reviewed mental health medications with patient prescribed by PCP and discussed compliance  Other interventions include: Motivational Interviewing employed Active listening / Reflection utilized  Engineer, petroleum Provided   Patient Self Care Activities & Deficits:  Patient is unable to independently navigate community resource options without care coordination support Patient is able to implement clinical interventions discussed today and is motivated for treatment  Patient will select one of the agencies from the list provided and call to schedule an appointment Performs ADL's independently Performs IADL's independently Strong family or social support  Initial goal documentation

## 2021-05-16 NOTE — Patient Outreach (Signed)
Medicaid Managed Care Social Work Note  05/16/2021 Name:  Latoya Walsh MRN:  [id number] DOB:  1960-06-26  Latoya Walsh is an 61 y.o. year old female who is a primary patient of [name], MD.  The Medicaid Managed Care Coordination team was consulted for assistance with:  [place] and Resources  [name] was given information about Medicaid Managed Care Coordination team services today. [name] Patient agreed to services and verbal consent obtained.  Engaged with patient  for by telephone for initial visit in response to referral for case management and/or care coordination services.   Patient had dialysis today, therefore contact was brief.  Assessments/Interventions:  Review of past medical history, allergies, medications, health status, including review of consultants reports, laboratory and other test data, was performed as part of comprehensive evaluation and provision of chronic care management services.  SDOH: (Social Determinant of Health) assessments and interventions performed:   Advanced Directives Status:  Not addressed in this encounter.  Care Plan                 Allergies  Allergen Reactions   Penicillins Itching and Rash    Medications Reviewed Today     Reviewed by [name], MD (Physician) on 05/14/21 at 1141  Med List Status: <None>   Medication Order Taking? Sig Documenting Provider Last Dose Status Informant  ACCU-CHEK GUIDE test strip [id number] No Use to check glucose twice daily [name], NP Taking Active   Accu-Chek Softclix Lancets lancets [id number] No 2 (two) times daily. [provider] Taking Active Multiple Informants  amLODipine (NORVASC) 10 MG tablet [id number] No Take 10 mg by mouth daily. [provider] Taking Active   blood glucose meter kit and supplies [id number] No Dispense based on patient and insurance preference. Use to monitor glucose twice daily. [name], NP Taking Active Multiple Informants  calcitRIOL (ROCALTROL) 0.25 MCG capsule [id number] No Take 0.5 mcg by mouth daily. [provider] Taking Active Multiple Informants           Med Note (ROBB, MELANIE A   Thu Dec 19, 2020  1:12 PM) 9/27-dose change to two tablets daily  Cholecalciferol (VITAMIN D3) 25 MCG (1000 UT) CAPS [id number] No Take 1 capsule by mouth daily. [provider] Taking Active   furosemide (LASIX) 80 MG tablet [id number] No Take 80 mg  (1 tablet) in the morning and 40 mg (half tablet) in the afternoon. [name], MD Taking Active Multiple Informants  gabapentin (NEURONTIN) 100 MG capsule [id number] No TAKE ONE CAPSULE EVERY MORNING and TAKE ONE TABLET AT NOON and TAKE ONE CAPSULE EVERYDAY AT BEDTIME [name], MD Taking Active   glipiZIDE (GLUCOTROL XL) 5 MG 24 hr tablet [id number]  TAKE ONE TABLET BY MOUTH EVERY MORNING [name], MD  Active   hydrALAZINE (APRESOLINE) 50 MG tablet [id number] No TAKE ONE TABLET BY MOUTH THREE TIMES DAILY [name], MD Taking Active   Insulin Pen Needle (PEN NEEDLES) 31G X 5 MM MISC [id number] No Use to inject insulin once daily [name], NP Taking Active Multiple Informants  isosorbide mononitrate (IMDUR) 30 MG 24 hr tablet [id number] No Take 1 tablet (30 mg total) by mouth daily. [name], MD Taking Expired 02/18/21 2359 Multiple Informants           Med Note [place] Ponder D   Thu Jan 02, 2021 10:26 AM) Needs new rx  isosorbide mononitrate (IMDUR) 30 MG 24 hr tablet [id number]  Take 1 tablet (30 mg total) by mouth daily. [name], MD  Active   LANTUS SOLOSTAR 100 UNIT/ML Solostar Pen [id number]  INJECT 35 UNITS into THE SKIN AT BEDTIME [name], NP  Active   metoprolol succinate (TOPROL-XL) 50 MG 24 hr tablet [id number] No Take 1 tablet (50 mg total) by mouth daily. Take with or immediately following a meal. [name], MD Taking Expired 02/18/21 2359  Multiple Informants           Med Note Garlan Fillers, SHERRY D   Thu Jan 02, 2021 10:26 AM) Needs new rx  metoprolol succinate (TOPROL-XL) 50 MG 24 hr tablet [id number]  Take 1 tablet (50 mg total) by mouth daily. Take with or immediately following a meal. [name], MD  Active   mirtazapine (REMERON) 15 MG tablet [id number] No TAKE ONE TABLET BY MOUTH EVERY EVENING [name], MD Taking Active   rosuvastatin (CRESTOR) 10 MG tablet [id number] No Take 1 tablet (10 mg total) by mouth daily. [name], MD Taking Active Multiple Informants  sevelamer carbonate (RENVELA) 800 MG tablet [id number] No Take 800 mg by mouth 3 (three) times daily with meals. [provider] Taking Active Multiple Informants           Med Note (ROBB, MELANIE A   Thu Dec 19, 2020  1:13 PM) This medication to be delivered today 12/19/20  sodium bicarbonate 650 MG tablet [id number] No Take 650 mg by mouth 3 (three) times daily. [provider] Taking Active Multiple Informants  Med List Note [name], [place] 75/64/33 2951):              Patient Active Problem List   Diagnosis Date Noted   ESRD (end stage renal disease) ([place]) 01/06/2021   CHF (congestive heart failure) ([place]) 06/06/2020   Leg swelling 04/28/2020   Hyperlipidemia LDL goal <100 04/28/2020   Left hand pain 03/10/2020   Anemia 02/19/2020   Depression, major, single episode, in partial remission ([place]) 01/25/2019   Headache 11/13/2018   Insomnia 09/25/2018   Screening for colorectal cancer 07/23/2016   Lumbar back pain with radiculopathy affecting right lower extremity 03/27/2015   Non compliance with medical treatment 07/22/2014   Chronic hepatitis C without hepatic coma ([place]) 12/04/2013   Allergic rhinitis 11/03/2012   Severe hypertension 06/25/2011   Anxiety and depression 02/05/2011   Nicotine dependence 11/01/2010   Drug dependence, continuous abuse ([place]) 03/22/2010   Uncontrolled type 2 diabetes mellitus  with hyperglycemia ([place]) 06/02/2007   Alcohol abuse 06/02/2007    Conditions to be addressed/monitored per PCP order:  Depression  Care Plan : General Social Work (Adult)  Updates made by [employer], Darla Lesches, LCSW since 05/16/2021 12:00 AM     Problem: CHL AMB "PATIENT-SPECIFIC PROBLEM"   Note:   CARE PLAN ENTRY (see longitudinal plan of care for additional care plan information)  Current Barriers:  Knowledge deficits related to accessing mental health provider in patient with Depression  Patient is experiencing symptoms of  depression which seem to be exacerbated by the loss of her sister.     Patient needs Support, Education, and Care Coordination in order to meet unmet mental health needs  Mental Health Concerns   Clinical Social Work Goal(s):  Over the next 90 days, patient will work with SW bi-weekly by telephone or in person to reduce or manage symptoms of depression until connected for  ongoing counseling resources.  Patient will implement clinical interventions discussed today to decreases symptoms of depression and increase knowledge and/or ability of: coping skills.  Interventions:  Assessed patient's understanding, education, previous treatment and care coordination needs  Patient interviewed and appropriate assessments performed: PHQ 2 PHQ 9 Patient discussed the recent loss of her sister Provided basic mental health support, education and interventions  Grief response normalized, emotional support provided, grief counseling encouraged Collaborated with appropriate clinical care team members regarding patient's needs through co-signature Discussed  options for long term counseling based on need and insurance. Patient agreeable to ongoing mental health counseling Reviewed mental health medications with patient prescribed by PCP and discussed compliance  Other interventions include: Motivational Interviewing employed Active listening / Reflection utilized  Engineer, petroleum  Provided   Patient Self Care Activities & Deficits:  Patient is unable to independently navigate community resource options without care coordination support Patient is able to implement clinical interventions discussed today and is motivated for treatment  Patient will select one of the agencies from the list provided and call to schedule an appointment Performs ADL's independently Performs IADL's independently Strong family or social support  Initial goal documentation      Follow up:  Patient agrees to Care Plan and Follow-up.  Plan: The Managed Medicaid care management team will reach out to the patient again over the next 14 business days.  Date/time of next scheduled Social Work care management/care coordination outreach:  05/20/21   Elliot Gurney, [place] [facility] Social Worker [phone]

## 2021-05-17 DIAGNOSIS — N179 Acute kidney failure, unspecified: Secondary | ICD-10-CM | POA: Diagnosis not present

## 2021-05-18 DIAGNOSIS — N179 Acute kidney failure, unspecified: Secondary | ICD-10-CM | POA: Diagnosis not present

## 2021-05-19 DIAGNOSIS — N179 Acute kidney failure, unspecified: Secondary | ICD-10-CM | POA: Diagnosis not present

## 2021-05-20 ENCOUNTER — Other Ambulatory Visit: Payer: Self-pay

## 2021-05-20 DIAGNOSIS — E1122 Type 2 diabetes mellitus with diabetic chronic kidney disease: Secondary | ICD-10-CM | POA: Diagnosis not present

## 2021-05-20 DIAGNOSIS — N186 End stage renal disease: Secondary | ICD-10-CM | POA: Diagnosis not present

## 2021-05-20 DIAGNOSIS — Z992 Dependence on renal dialysis: Secondary | ICD-10-CM | POA: Diagnosis not present

## 2021-05-20 DIAGNOSIS — N179 Acute kidney failure, unspecified: Secondary | ICD-10-CM | POA: Diagnosis not present

## 2021-05-20 NOTE — Patient Outreach (Signed)
Medicaid Managed Care Social Work Note  05/20/2021 Name:  Latoya Walsh MRN:  [id number] DOB:  06-17-1960  Latoya Walsh is an 61 y.o. year old female who is a primary patient of [name], MD.  The Medicaid Managed Care Coordination team was consulted for assistance with:  [place] and Resources  Ms. Eberlin was given information about Medicaid Managed Care Coordination team services today. [name] Patient agreed to services and verbal consent obtained.  Engaged with patient  for by telephone forfollow up visit in response to referral for case management and/or care coordination services.   Assessments/Interventions:  Review of past medical history, allergies, medications, health status, including review of consultants reports, laboratory and other test data, was performed as part of comprehensive evaluation and provision of chronic care management services.  SDOH: (Social Determinant of Health) assessments and interventions performed:   Advanced Directives Status:  Not addressed in this encounter.  Care Plan                 Allergies  Allergen Reactions   Penicillins Itching and Rash    Medications Reviewed Today     Reviewed by [name], MD (Physician) on 05/14/21 at 1141  Med List Status: <None>   Medication Order Taking? Sig Documenting Provider Last Dose Status Informant  ACCU-CHEK GUIDE test strip [id number] No Use to check glucose twice daily [name], NP Taking Active   Accu-Chek Softclix Lancets lancets [id number] No 2 (two) times daily. [provider] Taking Active Multiple Informants  amLODipine (NORVASC) 10 MG tablet [id number] No Take 10 mg by mouth daily. [provider] Taking Active   blood glucose meter kit and supplies [id number] No Dispense based on patient and insurance preference. Use to monitor glucose twice daily. [name], NP Taking Active Multiple Informants  calcitRIOL  (ROCALTROL) 0.25 MCG capsule [id number] No Take 0.5 mcg by mouth daily. [provider] Taking Active Multiple Informants           Med Note (ROBB, MELANIE A   Thu Dec 19, 2020  1:12 PM) 9/27-dose change to two tablets daily  Cholecalciferol (VITAMIN D3) 25 MCG (1000 UT) CAPS [id number] No Take 1 capsule by mouth daily. [provider] Taking Active   furosemide (LASIX) 80 MG tablet [id number] No Take 80 mg  (1 tablet) in the morning and 40 mg (half tablet) in the afternoon. [name], MD Taking Active Multiple Informants  gabapentin (NEURONTIN) 100 MG capsule [id number] No TAKE ONE CAPSULE EVERY MORNING and TAKE ONE TABLET AT NOON and TAKE ONE CAPSULE EVERYDAY AT BEDTIME [name], MD Taking Active   glipiZIDE (GLUCOTROL XL) 5 MG 24 hr tablet [id number]  TAKE ONE TABLET BY MOUTH EVERY MORNING [name], MD  Active   hydrALAZINE (APRESOLINE) 50 MG tablet [id number] No TAKE ONE TABLET BY MOUTH THREE TIMES DAILY [name], MD Taking Active   Insulin Pen Needle (PEN NEEDLES) 31G X 5 MM MISC [id number] No Use to inject insulin once daily [name], NP Taking Active Multiple Informants  isosorbide mononitrate (IMDUR) 30 MG 24 hr tablet [id number] No Take 1 tablet (30 mg total) by mouth daily. [name], MD Taking Expired 02/18/21 2359 Multiple Informants           Med Note Garlan Fillers, SHERRY D   Thu Jan 02, 2021 10:26 AM) Needs new rx  isosorbide mononitrate (IMDUR) 30 MG 24 hr tablet [id number]  Take 1 tablet (30 mg total) by mouth daily. [name], MD  Active   LANTUS SOLOSTAR 100 UNIT/ML Solostar Pen [id number]  INJECT 35 UNITS into THE SKIN AT BEDTIME [name], NP  Active   metoprolol succinate (TOPROL-XL) 50 MG 24 hr tablet [id number] No Take 1 tablet (50 mg total) by mouth daily. Take with or immediately following a meal. [name], MD Taking Expired 02/18/21 2359 Multiple Informants           Med Note Garlan Fillers,  SHERRY D   Thu Jan 02, 2021 10:26 AM) Needs new rx  metoprolol succinate (TOPROL-XL) 50 MG 24 hr tablet [id number]  Take 1 tablet (50 mg total) by mouth daily. Take with or immediately following a meal. [name], MD  Active   mirtazapine (REMERON) 15 MG tablet [id number] No TAKE ONE TABLET BY MOUTH EVERY EVENING [name], MD Taking Active   rosuvastatin (CRESTOR) 10 MG tablet [id number] No Take 1 tablet (10 mg total) by mouth daily. [name], MD Taking Active Multiple Informants  sevelamer carbonate (RENVELA) 800 MG tablet [id number] No Take 800 mg by mouth 3 (three) times daily with meals. [provider] Taking Active Multiple Informants           Med Note (ROBB, MELANIE A   Thu Dec 19, 2020  1:13 PM) This medication to be delivered today 12/19/20  sodium bicarbonate 650 MG tablet [id number] No Take 650 mg by mouth 3 (three) times daily. [provider] Taking Active Multiple Informants  Med List Note [name], [place] 75/17/00 1749):              Patient Active Problem List   Diagnosis Date Noted   ESRD (end stage renal disease) ([place]) 01/06/2021   CHF (congestive heart failure) ([place]) 06/06/2020   Leg swelling 04/28/2020   Hyperlipidemia LDL goal <100 04/28/2020   Left hand pain 03/10/2020   Anemia 02/19/2020   Depression, major, single episode, in partial remission ([place]) 01/25/2019   Headache 11/13/2018   Insomnia 09/25/2018   Screening for colorectal cancer 07/23/2016   Lumbar back pain with radiculopathy affecting right lower extremity 03/27/2015   Non compliance with medical treatment 07/22/2014   Chronic hepatitis C without hepatic coma ([place]) 12/04/2013   Allergic rhinitis 11/03/2012   Severe hypertension 06/25/2011   Anxiety and depression 02/05/2011   Nicotine dependence 11/01/2010   Drug dependence, continuous abuse ([place]) 03/22/2010   Uncontrolled type 2 diabetes mellitus with hyperglycemia ([place]) 06/02/2007   Alcohol  abuse 06/02/2007    Conditions to be addressed/monitored per PCP order:  Depression  Care Plan : General Social Work (Adult)  Updates made by [employer], Darla Lesches, LCSW since 05/20/2021 12:00 AM     Problem: CHL AMB "PATIENT-SPECIFIC PROBLEM"   Note:   CARE PLAN ENTRY (see longitudinal plan of care for additional care plan information)  Current Barriers:  Knowledge deficits related to accessing mental health provider in patient with Depression  Patient is experiencing symptoms of  depression which seem to be exacerbated by the loss of her sister.     Patient needs Support, Education, and Care Coordination in order to meet unmet mental health needs  Mental Health Concerns   Clinical Social Work Goal(s):  Over the next 90 days, patient will work with SW bi-weekly by telephone or in person to reduce or manage symptoms of depression until connected for ongoing counseling resources.  Patient will implement clinical interventions discussed  today to decreases symptoms of depression and increase knowledge and/or ability of: coping skills.  Interventions:  Assessed patient's understanding, education, previous treatment and care coordination needs  Patient continues to discussed difficulty in dealing with the recent loss of her sister evidenced by depressed mood and crying spells Patient confirms having family support, lives with her daughter at this time but is working with her Education officer, museum to find her own place. Dialysis continues on Monday, Wednesdays and Fridays-vans provides transport Provided basic mental health support, education and interventions  Grief response normalized, emotional support provided, grief counseling encouraged Collaborated with appropriate clinical care team members regarding patient's needs through co-signature Discussed  options for long term counseling based on need and insurance. Patient agreeable to ongoing mental health counseling Patient to be referred for ongoing  mental health counseling through [place] mental health medications with patient prescribed by PCP and discussed compliance  Other interventions include: Motivational Interviewing employed Active listening / Reflection utilized  Engineer, petroleum Provided   Patient Self Care Activities & Deficits:  Patient is unable to independently navigate community resource options without care coordination support Patient is able to implement clinical interventions discussed today and is motivated for treatment  Patient will select one of the agencies from the list provided and call to schedule an appointment Performs ADL's independently Performs IADL's independently Strong family or social support  Please see past updates related to this goal by clicking on the "Past Updates" button in the selected goal       Follow up:  Patient agrees to Care Plan and Follow-up.  Plan: The Managed Medicaid care management team will reach out to the patient again over the next 14 days.  Date/time of next scheduled Social Work care management/care coordination outreach:  06/03/21  Elliot Gurney, [place] [facility] Social Worker [phone]

## 2021-05-20 NOTE — Patient Instructions (Signed)
Visit Information  Thank you for taking time to visit with me today. Please don't hesitate to contact me if I can be of assistance to you before our next scheduled telephone appointment.  Following are the goals we discussed today:  (Copy and paste patient goals from clinical care plan here)  Our next appointment is by telephone on 06/03/21 at 10am  Please call the care guide team at [phone] if you need to cancel or reschedule your appointment.   If you are experiencing a Mental Health or [place] or need someone to talk to, please call the Suicide and Crisis Lifeline: 988   Patient verbalizes understanding of instructions and care plan provided today and agrees to view in [place]. Active MyChart status confirmed with patient.    Telephone follow up appointment with care management team member scheduled for: 06/03/21  Elliot Gurney, LCSW Managed [facility] Social Worker [phone]

## 2021-05-21 DIAGNOSIS — N179 Acute kidney failure, unspecified: Secondary | ICD-10-CM | POA: Diagnosis not present

## 2021-05-22 DIAGNOSIS — N179 Acute kidney failure, unspecified: Secondary | ICD-10-CM | POA: Diagnosis not present

## 2021-05-23 DIAGNOSIS — N179 Acute kidney failure, unspecified: Secondary | ICD-10-CM | POA: Diagnosis not present

## 2021-05-24 DIAGNOSIS — N179 Acute kidney failure, unspecified: Secondary | ICD-10-CM | POA: Diagnosis not present

## 2021-05-25 DIAGNOSIS — N179 Acute kidney failure, unspecified: Secondary | ICD-10-CM | POA: Diagnosis not present

## 2021-05-26 DIAGNOSIS — N179 Acute kidney failure, unspecified: Secondary | ICD-10-CM | POA: Diagnosis not present

## 2021-05-27 DIAGNOSIS — N179 Acute kidney failure, unspecified: Secondary | ICD-10-CM | POA: Diagnosis not present

## 2021-05-28 ENCOUNTER — Other Ambulatory Visit: Payer: Self-pay

## 2021-05-28 DIAGNOSIS — N179 Acute kidney failure, unspecified: Secondary | ICD-10-CM | POA: Diagnosis not present

## 2021-05-29 DIAGNOSIS — N179 Acute kidney failure, unspecified: Secondary | ICD-10-CM | POA: Diagnosis not present

## 2021-05-30 ENCOUNTER — Encounter: Payer: Self-pay | Admitting: Cardiology

## 2021-05-30 ENCOUNTER — Ambulatory Visit (INDEPENDENT_AMBULATORY_CARE_PROVIDER_SITE_OTHER): Payer: Medicaid Other | Admitting: Cardiology

## 2021-05-30 ENCOUNTER — Other Ambulatory Visit: Payer: Self-pay

## 2021-05-30 VITALS — BP 140/72 | HR 82 | Ht 67.0 in | Wt 176.0 lb

## 2021-05-30 DIAGNOSIS — I5022 Chronic systolic (congestive) heart failure: Secondary | ICD-10-CM | POA: Diagnosis not present

## 2021-05-30 NOTE — Patient Instructions (Signed)
Testing/Procedures: ?Your physician has requested that you have an echocardiogram. Echocardiography is a painless test that uses sound waves to create images of your heart. It provides your doctor with information about the size and shape of your heart and how well your heart?s chambers and valves are working. This procedure takes approximately one hour. There are no restrictions for this procedure. ? ? ?Follow-Up: ?Follow up in 3 months with Dr. Harl Bowie ? ? ?Any Other Special Instructions Will Be Listed Below (If Applicable). ? ? ? ? ?If you need a refill on your cardiac medications before your next appointment, please call your pharmacy. ? ?

## 2021-05-30 NOTE — Progress Notes (Signed)
? ? ? ?Clinical Summary ?Ms. Rieves is a 61 y.o.female seen today for follow up of the following medical problems.  ? ?1. Chronic systolic HF ?- new diagnosis during 05/2020 admisino ?- 05/2020 echo LVEF 30-35% ?- 09/2020 echo LVEF 45%, grade III dd ?- cath not pursued due to renal dysfunction at the time, medical therapy was also limited at that time. Patient now on HD ?  ?- no SOB/DOE ?- fluid managed with HD ?- compliant withmeds ?- reports low bp's during HD at times.  ?  ?2. ESRD ?- followed by nephrology Dr Theador Hawthorne ?- from notes had nephrtoic range proteinuria, she is on ARB per renal ?  ? ?3. Hyperlipideia ?- upcoming labs with pcp ?Past Medical History:  ?Diagnosis Date  ? Allergic rhinitis   ? Anxiety   ? CHF (congestive heart failure) ([place])   ? a. EF 30-35% by echo in 05/2020 b. EF at 45% in 09/2020  ? Chronic back pain   ? Chronic hepatitis C without hepatic coma (HCC)   ? COVID-19 virus infection 12/26/2019  ? Depression   ? Diabetes mellitus   ? Hepatitis C   ? Hypertension   ? Noncompliance   ? Poor appetite 07/17/2014  ? Substance abuse ([place])   ? HX of drug use and alcohol use  ? ? ? ?Allergies  ?Allergen Reactions  ? Penicillins Itching and Rash  ? ? ? ?Current Outpatient Medications  ?Medication Sig Dispense Refill  ? ACCU-CHEK GUIDE test strip Use to check glucose twice daily 100 each 11  ? Accu-Chek Softclix Lancets lancets 2 (two) times daily.    ? amLODipine (NORVASC) 10 MG tablet Take 10 mg by mouth daily.    ? blood glucose meter kit and supplies Dispense based on patient and insurance preference. Use to monitor glucose twice daily. 1 each 0  ? calcitRIOL (ROCALTROL) 0.25 MCG capsule Take 0.5 mcg by mouth daily.    ? Cholecalciferol (VITAMIN D3) 25 MCG (1000 UT) CAPS Take 1 capsule by mouth daily.    ? furosemide (LASIX) 80 MG tablet Take 80 mg  (1 tablet) in the morning and 40 mg (half tablet) in the afternoon. 135 tablet 3  ? gabapentin (NEURONTIN) 100 MG capsule TAKE ONE CAPSULE EVERY MORNING  and TAKE ONE TABLET AT NOON and TAKE ONE CAPSULE EVERYDAY AT BEDTIME 90 capsule 1  ? glipiZIDE (GLUCOTROL XL) 5 MG 24 hr tablet TAKE ONE TABLET BY MOUTH EVERY MORNING 30 tablet 0  ? hydrALAZINE (APRESOLINE) 50 MG tablet TAKE ONE TABLET BY MOUTH THREE TIMES DAILY 90 tablet 3  ? insulin glargine (LANTUS) 100 UNIT/ML Solostar Pen Inject 35 Units into the skin at bedtime. 15 mL 1  ? Insulin Pen Needle (PEN NEEDLES) 31G X 5 MM MISC Use to inject insulin once daily 90 each 3  ? isosorbide mononitrate (IMDUR) 30 MG 24 hr tablet Take 1 tablet (30 mg total) by mouth daily. 90 tablet 3  ? isosorbide mononitrate (IMDUR) 30 MG 24 hr tablet Take 1 tablet (30 mg total) by mouth daily. 30 tablet 5  ? metoprolol succinate (TOPROL-XL) 50 MG 24 hr tablet Take 1 tablet (50 mg total) by mouth daily. Take with or immediately following a meal. 90 tablet 3  ? metoprolol succinate (TOPROL-XL) 50 MG 24 hr tablet Take 1 tablet (50 mg total) by mouth daily. Take with or immediately following a meal. 30 tablet 3  ? mirtazapine (REMERON) 15 MG tablet TAKE ONE TABLET BY MOUTH  EVERY EVENING 90 tablet 1  ? rosuvastatin (CRESTOR) 10 MG tablet Take 1 tablet (10 mg total) by mouth daily. 90 tablet 3  ? sevelamer carbonate (RENVELA) 800 MG tablet Take 800 mg by mouth 3 (three) times daily with meals.    ? sodium bicarbonate 650 MG tablet Take 650 mg by mouth 3 (three) times daily.    ? ?No current facility-administered medications for this visit.  ? ? ? ?Past Surgical History:  ?Procedure Laterality Date  ? APPENDECTOMY  2004  ? AV FISTULA PLACEMENT Left 11/26/2020  ? Procedure: LEFT ARM ARTERIOVENOUS (AV) FISTULA CREATION;  Surgeon: [name], MD;  Location: [facility];  Service: Vascular;  Laterality: Left;  ? IR FLUORO GUIDE CV LINE RIGHT  12/25/2020  ? IR US GUIDE VASC ACCESS RIGHT  12/25/2020  ? ? ? ?Allergies  ?Allergen Reactions  ? Penicillins Itching and Rash  ? ? ? ? ?Family History  ?Problem Relation Age of Onset  ? Diabetes Sister   ?     Twin  - AIDS   ? ? ? ?Social History ?[name] reports that she has been smoking cigarettes. She has been smoking an average of .25 packs per day. She has never used smokeless tobacco. ?Ms. Bonneville reports that she does not currently use alcohol after a past usage of about 40.0 standard drinks per week. ? ? ?Review of Systems ?CONSTITUTIONAL: No weight loss, fever, chills, weakness or fatigue.  ?HEENT: Eyes: No visual loss, blurred vision, double vision or yellow sclerae.No hearing loss, sneezing, congestion, runny nose or sore throat.  ?SKIN: No rash or itching.  ?CARDIOVASCULAR: per hpi ?RESPIRATORY: No shortness of breath, cough or sputum.  ?GASTROINTESTINAL: No anorexia, nausea, vomiting or diarrhea. No abdominal pain or blood.  ?GENITOURINARY: No burning on urination, no polyuria ?NEUROLOGICAL: No headache, dizziness, syncope, paralysis, ataxia, numbness or tingling in the extremities. No change in bowel or bladder control.  ?MUSCULOSKELETAL: No muscle, back pain, joint pain or stiffness.  ?LYMPHATICS: No enlarged nodes. No history of splenectomy.  ?PSYCHIATRIC: No history of depression or anxiety.  ?ENDOCRINOLOGIC: No reports of sweating, cold or heat intolerance. No polyuria or polydipsia.  ?. ? ? ?Physical Examination ?Today's Vitals  ? 05/30/21 1430  ?BP: 140/72  ?Pulse: 82  ?SpO2: 98%  ?Weight: 176 lb (79.8 kg)  ?Height: 5' 7"  (1.702 m)  ? ?Body mass index is 27.57 kg/m?. ? ?Gen: resting comfortably, no acute distress ?HEENT: no scleral icterus, pupils equal round and reactive, no palptable cervical adenopathy,  ?CV: RRR, no m/r/g no jvd ?Resp: Clear to auscultation bilaterally ?GI: abdomen is soft, non-tender, non-distended, normal bowel sounds, no hepatosplenomegaly ?MSK: extremities are warm, no edema.  ?Skin: warm, no rash ?Neuro:  no focal deficits ?Psych: appropriate affect ? ? ?Diagnostic Studies ? ? ?05/2020 echo ?IMPRESSIONS  ? ? ? 1. Left ventricular ejection fraction, by estimation, is 30 to 35%. The   ?left ventricle has moderately decreased function. The left ventricle  ?demonstrates global hypokinesis. Left ventricular diastolic parameters are  ?indeterminate.  ? 2. Right ventricular systolic function is low normal. The right  ?ventricular size is normal. There is moderately elevated pulmonary artery  ?systolic pressure. The estimated right ventricular systolic pressure is  ?38.8 mmHg.  ? 3. A small pericardial effusion is present. The pericardial effusion is  ?posterior to the left ventricle.  ? 4. The mitral valve is grossly normal. Mild mitral valve regurgitation.  ? 5. Tricuspid valve regurgitation is moderate.  ?  6. The aortic valve is tricuspid. Aortic valve regurgitation is not  ?visualized.  ? 7. The inferior vena cava is normal in size with <50% respiratory  ?variability, suggesting right atrial pressure of 8 mmHg.  ? ?Assessment and Plan  ? ?1. Chronic systolic HF ?- new diagnosis during 05/2020 admission ?- management had been limited due to advanced kidney dysfunction, limited medication adherence. Did not have cath at the time ?- now on hemodialysis. Most recent echo improved to 45% ?- will repeat limited echo, if ongoing dysfunction now that she is on HD would d/c her hydral/imdur/norvasc and try to get her on entresto and aldactone. Not a candidate for SGLT2i on HD.  ? ? ? ? ?[name], M.D. ?

## 2021-06-01 DIAGNOSIS — N179 Acute kidney failure, unspecified: Secondary | ICD-10-CM | POA: Diagnosis not present

## 2021-06-02 ENCOUNTER — Encounter (HOSPITAL_COMMUNITY)
Admission: RE | Admit: 2021-06-02 | Discharge: 2021-06-02 | Disposition: A | Discharge status: Home / Self Care | Payer: Medicaid Other | Source: Ambulatory Visit | Attending: Vascular Surgery | Admitting: Vascular Surgery

## 2021-06-02 DIAGNOSIS — N179 Acute kidney failure, unspecified: Secondary | ICD-10-CM | POA: Diagnosis not present

## 2021-06-02 NOTE — Pre-Procedure Instructions (Signed)
Left voicemail for patient to call about pre-op information on cell number-[phone]. ?

## 2021-06-02 NOTE — Pre-Procedure Instructions (Signed)
Attempted to call home phone of [phone] today to notify patient of pre-surgical instructions. Phone rings and there is no voicemail set up. I called  cell number of [phone] (which is number a voicemaill was left on earlier and labeled as patients cell in chart), someone answered the phone and I asked for Ms Attaway and they gave me here number of [phone] to call. I explained that I needed to talk to her about her preop instructions and taht we had been trying to reacj her since Friday and they stated, :"she's at dialysis and wont answer her phone. She knows about her surgery, shes been talking about it", and hung up. ?

## 2021-06-03 ENCOUNTER — Ambulatory Visit: Payer: Self-pay

## 2021-06-03 ENCOUNTER — Encounter (HOSPITAL_COMMUNITY): Payer: Self-pay | Admitting: Vascular Surgery

## 2021-06-03 ENCOUNTER — Ambulatory Visit (HOSPITAL_COMMUNITY)
Admission: RE | Admit: 2021-06-03 | Discharge: 2021-06-03 | Disposition: A | Discharge status: Home / Self Care | Payer: Medicaid Other | Attending: Vascular Surgery | Admitting: Vascular Surgery

## 2021-06-03 ENCOUNTER — Ambulatory Visit (HOSPITAL_BASED_OUTPATIENT_CLINIC_OR_DEPARTMENT_OTHER): Payer: Medicaid Other | Admitting: Anesthesiology

## 2021-06-03 ENCOUNTER — Other Ambulatory Visit: Payer: Self-pay

## 2021-06-03 ENCOUNTER — Encounter (HOSPITAL_COMMUNITY)
Admission: RE | Disposition: A | Discharge status: Home / Self Care | Payer: Self-pay | Source: Home / Self Care | Attending: Vascular Surgery

## 2021-06-03 ENCOUNTER — Ambulatory Visit (HOSPITAL_COMMUNITY): Payer: Medicaid Other | Admitting: Anesthesiology

## 2021-06-03 DIAGNOSIS — I132 Hypertensive heart and chronic kidney disease with heart failure and with stage 5 chronic kidney disease, or end stage renal disease: Secondary | ICD-10-CM | POA: Diagnosis not present

## 2021-06-03 DIAGNOSIS — N185 Chronic kidney disease, stage 5: Secondary | ICD-10-CM | POA: Diagnosis not present

## 2021-06-03 DIAGNOSIS — I509 Heart failure, unspecified: Secondary | ICD-10-CM | POA: Insufficient documentation

## 2021-06-03 DIAGNOSIS — F418 Other specified anxiety disorders: Secondary | ICD-10-CM | POA: Diagnosis not present

## 2021-06-03 DIAGNOSIS — N186 End stage renal disease: Secondary | ICD-10-CM

## 2021-06-03 DIAGNOSIS — E1122 Type 2 diabetes mellitus with diabetic chronic kidney disease: Secondary | ICD-10-CM | POA: Diagnosis not present

## 2021-06-03 DIAGNOSIS — F17210 Nicotine dependence, cigarettes, uncomplicated: Secondary | ICD-10-CM | POA: Insufficient documentation

## 2021-06-03 DIAGNOSIS — Z992 Dependence on renal dialysis: Secondary | ICD-10-CM | POA: Diagnosis not present

## 2021-06-03 DIAGNOSIS — N179 Acute kidney failure, unspecified: Secondary | ICD-10-CM | POA: Diagnosis not present

## 2021-06-03 HISTORY — PX: AV FISTULA PLACEMENT: SHX1204

## 2021-06-03 LAB — POCT I-STAT, CHEM 8
BUN: 44 mg/dL — ABNORMAL HIGH (ref 6–20)
Calcium, Ion: 1.17 mmol/L (ref 1.15–1.40)
Chloride: 96 mmol/L — ABNORMAL LOW (ref 98–111)
Creatinine, Ser: 5.6 mg/dL — ABNORMAL HIGH (ref 0.44–1.00)
Glucose, Bld: 402 mg/dL — ABNORMAL HIGH (ref 70–99)
HCT: 39 % (ref 36.0–46.0)
Hemoglobin: 13.3 g/dL (ref 12.0–15.0)
Potassium: 4.9 mmol/L (ref 3.5–5.1)
Sodium: 134 mmol/L — ABNORMAL LOW (ref 135–145)
TCO2: 30 mmol/L (ref 22–32)

## 2021-06-03 SURGERY — INSERTION OF ARTERIOVENOUS (AV) GORE-TEX GRAFT ARM
Anesthesia: General | Site: Arm Lower | Laterality: Left

## 2021-06-03 MED ORDER — HEPARIN 6000 UNIT IRRIGATION SOLUTION
Status: DC | PRN
  Administered 2021-06-03: 1

## 2021-06-03 MED ORDER — LACTATED RINGERS IV SOLN: INTRAVENOUS | Status: DC

## 2021-06-03 MED ORDER — CHLORHEXIDINE GLUCONATE 0.12 % MT SOLN: 15.0000 mL | Freq: Once | OROMUCOSAL | Status: DC

## 2021-06-03 MED ORDER — CHLORHEXIDINE GLUCONATE 4 % EX LIQD: 60.0000 mL | Freq: Once | CUTANEOUS | Status: DC

## 2021-06-03 MED ORDER — SODIUM CHLORIDE 0.9 % IV SOLN
INTRAVENOUS | Status: DC
  Administered 2021-06-03: 1000 mL via INTRAVENOUS

## 2021-06-03 MED ORDER — OXYCODONE-ACETAMINOPHEN 5-325 MG PO TABS
ORAL_TABLET | ORAL | Status: AC
  Filled 2021-06-03: qty 1

## 2021-06-03 MED ORDER — OXYCODONE-ACETAMINOPHEN 5-325 MG PO TABS: 1.0000 | ORAL_TABLET | Freq: Four times a day (QID) | ORAL | 0 refills | Status: DC | PRN

## 2021-06-03 MED ORDER — LIDOCAINE-EPINEPHRINE 0.5 %-1:200000 IJ SOLN
INTRAMUSCULAR | Status: DC | PRN
  Administered 2021-06-03: 16 mL

## 2021-06-03 MED ORDER — OXYCODONE-ACETAMINOPHEN 5-325 MG PO TABS
1.0000 | ORAL_TABLET | Freq: Once | ORAL | Status: AC
  Administered 2021-06-03: 1 via ORAL

## 2021-06-03 MED ORDER — VANCOMYCIN HCL IN DEXTROSE 1-5 GM/200ML-% IV SOLN
INTRAVENOUS | Status: AC
  Filled 2021-06-03: qty 200

## 2021-06-03 MED ORDER — LIDOCAINE-EPINEPHRINE 0.5 %-1:200000 IJ SOLN
INTRAMUSCULAR | Status: AC
  Filled 2021-06-03: qty 1

## 2021-06-03 MED ORDER — LIDOCAINE HCL (CARDIAC) PF 100 MG/5ML IV SOSY
PREFILLED_SYRINGE | INTRAVENOUS | Status: DC | PRN
  Administered 2021-06-03: 50 mg via INTRAVENOUS

## 2021-06-03 MED ORDER — FENTANYL CITRATE (PF) 100 MCG/2ML IJ SOLN
INTRAMUSCULAR | Status: AC
  Filled 2021-06-03: qty 2

## 2021-06-03 MED ORDER — PROPOFOL 10 MG/ML IV BOLUS
INTRAVENOUS | Status: DC | PRN
  Administered 2021-06-03 (×2): 20 mg via INTRAVENOUS
  Administered 2021-06-03: 50 mg via INTRAVENOUS

## 2021-06-03 MED ORDER — CHLORHEXIDINE GLUCONATE 4 % EX LIQD
60.0000 mL | Freq: Once | CUTANEOUS | Status: DC
Start: 2021-06-03 — End: 2021-06-03

## 2021-06-03 MED ORDER — PROPOFOL 500 MG/50ML IV EMUL
INTRAVENOUS | Status: DC | PRN
  Administered 2021-06-03: 50 ug/kg/min via INTRAVENOUS

## 2021-06-03 MED ORDER — ORAL CARE MOUTH RINSE: 15.0000 mL | Freq: Once | OROMUCOSAL | Status: DC

## 2021-06-03 MED ORDER — STERILE WATER FOR IRRIGATION IR SOLN
Status: DC | PRN
  Administered 2021-06-03: 500 mL

## 2021-06-03 MED ORDER — 0.9 % SODIUM CHLORIDE (POUR BTL) OPTIME
TOPICAL | Status: DC | PRN
  Administered 2021-06-03: 1000 mL

## 2021-06-03 MED ORDER — PHENYLEPHRINE HCL-NACL 20-0.9 MG/250ML-% IV SOLN
INTRAVENOUS | Status: DC | PRN
  Administered 2021-06-03: 30 ug/min via INTRAVENOUS

## 2021-06-03 MED ORDER — VANCOMYCIN HCL IN DEXTROSE 1-5 GM/200ML-% IV SOLN
1000.0000 mg | INTRAVENOUS | Status: AC
  Administered 2021-06-03: 1000 mg via INTRAVENOUS

## 2021-06-03 MED ORDER — HEPARIN SODIUM (PORCINE) 1000 UNIT/ML IJ SOLN
INTRAMUSCULAR | Status: AC
Start: 2021-06-03 — End: ?
  Filled 2021-06-03: qty 6

## 2021-06-03 SURGICAL SUPPLY — 43 items
ADH SKN CLS APL DERMABOND .7 (GAUZE/BANDAGES/DRESSINGS) ×1
ARMBAND PINK RESTRICT EXTREMIT (MISCELLANEOUS) ×3 IMPLANT
BAG HAMPER (MISCELLANEOUS) ×3 IMPLANT
CANNULA VESSEL 3MM 2 BLNT TIP (CANNULA) ×3 IMPLANT
CLIP LIGATING EXTRA MED SLVR (CLIP) ×3 IMPLANT
CLIP LIGATING EXTRA SM BLUE (MISCELLANEOUS) ×3 IMPLANT
COVER LIGHT HANDLE STERIS (MISCELLANEOUS) ×6 IMPLANT
COVER MAYO STAND XLG (MISCELLANEOUS) ×3 IMPLANT
DECANTER SPIKE VIAL GLASS SM (MISCELLANEOUS) ×3 IMPLANT
DERMABOND ADVANCED (GAUZE/BANDAGES/DRESSINGS) ×1
DERMABOND ADVANCED .7 DNX12 (GAUZE/BANDAGES/DRESSINGS) ×2 IMPLANT
ELECT REM PT RETURN 9FT ADLT (ELECTROSURGICAL) ×2
ELECTRODE REM PT RTRN 9FT ADLT (ELECTROSURGICAL) ×2 IMPLANT
GAUZE SPONGE 4X4 12PLY STRL (GAUZE/BANDAGES/DRESSINGS) ×6 IMPLANT
GLOVE SURG MICRO LTX SZ7.5 (GLOVE) ×3 IMPLANT
GLOVE SURG UNDER POLY LF SZ7 (GLOVE) ×9 IMPLANT
GOWN STRL REUS W/TWL LRG LVL3 (GOWN DISPOSABLE) ×9 IMPLANT
GRAFT GORETEX STRT 4-7X45 (Vascular Products) ×1 IMPLANT
IV NS 500ML (IV SOLUTION) ×2
IV NS 500ML BAXH (IV SOLUTION) ×4 IMPLANT
KIT BLADEGUARD II DBL (SET/KITS/TRAYS/PACK) ×3 IMPLANT
KIT TURNOVER KIT A (KITS) ×3 IMPLANT
MANIFOLD NEPTUNE II (INSTRUMENTS) ×3 IMPLANT
MARKER SKIN DUAL TIP RULER LAB (MISCELLANEOUS) ×6 IMPLANT
NDL HYPO 18GX1.5 BLUNT FILL (NEEDLE) ×2 IMPLANT
NEEDLE HYPO 18GX1.5 BLUNT FILL (NEEDLE) ×2 IMPLANT
NS IRRIG 1000ML POUR BTL (IV SOLUTION) ×3 IMPLANT
PACK CV ACCESS (CUSTOM PROCEDURE TRAY) ×3 IMPLANT
PAD ARMBOARD 7.5X6 YLW CONV (MISCELLANEOUS) ×3 IMPLANT
SET BASIN LINEN APH (SET/KITS/TRAYS/PACK) ×3 IMPLANT
SOL PREP POV-IOD 4OZ 10% (MISCELLANEOUS) ×3 IMPLANT
SOL PREP PROV IODINE SCRUB 4OZ (MISCELLANEOUS) ×3 IMPLANT
SPONGE T-LAP 18X18 ~~LOC~~+RFID (SPONGE) ×3 IMPLANT
SUT PROLENE 6 0 CC (SUTURE) ×4 IMPLANT
SUT SILK 2 0 FSL 18 (SUTURE) ×3 IMPLANT
SUT VIC AB 3-0 SH 27 (SUTURE) ×2
SUT VIC AB 3-0 SH 27X BRD (SUTURE) ×2 IMPLANT
SYR 10ML LL (SYRINGE) ×3 IMPLANT
SYR 50ML LL SCALE MARK (SYRINGE) ×2 IMPLANT
SYR CONTROL 10ML LL (SYRINGE) ×3 IMPLANT
SYR TOOMEY 50ML (SYRINGE) ×1 IMPLANT
UNDERPAD 30X36 HEAVY ABSORB (UNDERPADS AND DIAPERS) ×3 IMPLANT
WATER STERILE IRR 500ML POUR (IV SOLUTION) ×1 IMPLANT

## 2021-06-03 NOTE — Transfer of Care (Signed)
Immediate Anesthesia Transfer of Care Note ? ?Patient: Latoya Walsh ? ?Procedure(s) Performed: INSERTION OF LEFT UPPER ARM ARTERIOVENOUS (AV) GORE-TEX GRAFT (Left: Arm Lower) ? ?Patient Location: PACU ? ?Anesthesia Type:General ? ?Level of Consciousness: awake, alert , oriented and patient cooperative ? ?Airway & Oxygen Therapy: Patient Spontanous Breathing ? ?Post-op Assessment: Report given to RN, Post -op Vital signs reviewed and stable and Patient moving all extremities X 4 ? ?Post vital signs: Reviewed and stable ? ?Last Vitals:  ?Vitals Value Taken Time  ?BP    ?Temp    ?Pulse    ?Resp    ?SpO2    ? ? ?Last Pain:  ?Vitals:  ? 06/03/21 1110  ?TempSrc: Oral  ?PainSc: 0-No pain  ?   ? ?Patients Stated Pain Goal: 8 (06/03/21 1110) ? ?Complications: No notable events documented. ?

## 2021-06-03 NOTE — Anesthesia Postprocedure Evaluation (Signed)
Anesthesia Post Note ? ?Patient: Latoya Walsh ? ?Procedure(s) Performed: INSERTION OF LEFT UPPER ARM ARTERIOVENOUS (AV) GORE-TEX GRAFT (Left: Arm Lower) ? ?Patient location during evaluation: Phase II ?Anesthesia Type: General ?Level of consciousness: awake ?Pain management: pain level controlled ?Vital Signs Assessment: post-procedure vital signs reviewed and stable ?Respiratory status: spontaneous breathing and respiratory function stable ?Cardiovascular status: blood pressure returned to baseline and stable ?Postop Assessment: no headache and no apparent nausea or vomiting ?Anesthetic complications: no ?Comments: Late entry ? ? ?No notable events documented. ? ? ?Last Vitals:  ?Vitals:  ? 06/03/21 1300 06/03/21 1312  ?BP: 111/60 125/64  ?Pulse: 72 72  ?Resp: 14 13  ?Temp:    ?SpO2: 98% 99%  ?  ?Last Pain:  ?Vitals:  ? 06/03/21 1314  ?TempSrc:   ?PainSc: 6   ? ? ?  ?  ?  ?  ?  ?  ? ?Louann Sjogren ? ? ? ? ?

## 2021-06-03 NOTE — Op Note (Signed)
? ? ?  OPERATIVE REPORT ? ?DATE OF SURGERY: 06/03/2021 ? ?PATIENT: Latoya Walsh, 61 y.o. female ?MRN: [id number]  ?DOB: Mar 24, 1960 ? ?PRE-OPERATIVE DIAGNOSIS: End-stage renal disease ? ?POST-OPERATIVE DIAGNOSIS:  Same ? ?PROCEDURE: Left upper arm AV Gore-Tex graft placement ? ?SURGEON:  Curt Jews, M.D. ? ?PHYSICIAN ASSISTANT: Vevelyn Royals, RN ? ?The assistant was needed for exposure and to expedite the case ? ?ANESTHESIA: Local with sedation ? ?EBL: per anesthesia record ? ?Total I/O ?In: 300 [I.V.:100; IV Piggyback:200] ?Out: -  ? ?BLOOD ADMINISTERED: none ? ?DRAINS: none ? ?SPECIMEN: none ? ?COUNTS CORRECT:  YES ? ?PATIENT DISPOSITION:  PACU - hemodynamically stable ? ?PROCEDURE DETAILS: ?The patient is sitting up and placed supine position with area in the left arm.  Sterile fashion.  SonoSite ultrasound was used to visualize the veins on the arm.  The patient had a small cephalic vein in the upper arm and a small basilic vein on the medial aspect.  Decision was made to proceed with prosthetic Gore-Tex graft placement.  Using local anesthesia, incision was made at the antecubital space and the brachial artery was exposed.  The artery was of good size and had no evidence of atherosclerotic change.  The artery was encircled with a vessel loop.  Next a separate incision was made using local anesthesia at the axilla and the axillary vein was exposed.  The vein was of good caliber as well.  A tunnel was created from the level of the antecubital incision to the axillary incision and a 4 x 7 taken Gore-Tex graft was brought through the tunneler.  The brachial artery was occluded proximally distally and was opened with an 11 blade and sent longitudinally with Potts scissors.  A small arteriotomy was created to reduce risk of steal.  The graft was spatulated and sewn end-to-side to the artery with a running 6-0 Prolene suture.  This anastomosis was tested and found to be adequate.  The graft was flushed with heparinized  saline and reoccluded.  The axillary vein was occluded proximally and distally and was opened with an 11 blade and sent lossing symptoms.  A 7 mm portion of the graft was cut to the appropriate length and was spatulated and sewn end-to-side to the artery with a running 6-0 Prolene suture.  Good thrill was noted.  The wounds were irrigated with saline.  Hemostasis was obtained with electrocautery.  The wounds were closed with 3-0 Vicryl in the subcutaneous and subcuticular tissue.  Sterile dressing was applied and the patient was transferred to the recovery room in stable condition ? ? ?[name], M.D., FACS ?06/03/2021 ?12:55 PM ? ?Note: Portions of this report may have been transcribed using voice recognition software.  Every effort has been made to ensure accuracy; however, inadvertent computerized transcription errors may still be present. ? ? ?

## 2021-06-03 NOTE — Interval H&P Note (Signed)
History and Physical Interval Note: ? ?06/03/2021 ?11:13 AM ? ?[name]  has presented today for surgery, with the diagnosis of ESRD.  The various methods of treatment have been discussed with the patient and family. After consideration of risks, benefits and other options for treatment, the patient has consented to  Procedure(s): ?INSERTION OF LEFT UPPER ARM ARTERIOVENOUS (AV) GORE-TEX GRAFT VERSUS LEFT UPPER ARM ARTERIOVENOUS FISTULA (Left) as a surgical intervention.  The patient's history has been reviewed, patient examined, no change in status, stable for surgery.  I have reviewed the patient's chart and labs.  Questions were answered to the patient's satisfaction.   ? ? ?Latoya Walsh ? ? ?

## 2021-06-03 NOTE — Anesthesia Preprocedure Evaluation (Signed)
Anesthesia Evaluation  ?Patient identified by MRN, date of birth, ID band ?Patient awake ? ? ? ?Reviewed: ?Allergy & Precautions, H&P , NPO status , Patient's Chart, lab work & pertinent test results, reviewed documented beta blocker date and time  ? ?Airway ?Mallampati: II ? ?TM Distance: >3 FB ?Neck ROM: full ? ? ? Dental ?no notable dental hx. ? ?  ?Pulmonary ?neg pulmonary ROS, Current Smoker,  ?  ?Pulmonary exam normal ?breath sounds clear to auscultation ? ? ? ? ? ? Cardiovascular ?Exercise Tolerance: Good ?hypertension, +CHF  ? ?Rhythm:regular Rate:Normal ? ? ?  ?Neuro/Psych ? Headaches, PSYCHIATRIC DISORDERS Anxiety Depression  Neuromuscular disease   ? GI/Hepatic ?negative GI ROS, (+) Hepatitis -, C  ?Endo/Other  ?negative endocrine ROSdiabetes ? Renal/GU ?ESRF and DialysisRenal disease  ?negative genitourinary ?  ?Musculoskeletal ? ? Abdominal ?  ?Peds ? Hematology ? ?(+) Blood dyscrasia, anemia ,   ?Anesthesia Other Findings ? ? Reproductive/Obstetrics ?negative OB ROS ? ?  ? ? ? ? ? ? ? ? ? ? ? ? ? ?  ?  ? ? ? ? ? ? ? ? ?Anesthesia Physical ?Anesthesia Plan ? ?ASA: 3 ? ?Anesthesia Plan: General  ? ?Post-op Pain Management:   ? ?Induction:  ? ?PONV Risk Score and Plan: Propofol infusion ? ?Airway Management Planned:  ? ?Additional Equipment:  ? ?Intra-op Plan:  ? ?Post-operative Plan:  ? ?Informed Consent: I have reviewed the patients History and Physical, chart, labs and discussed the procedure including the risks, benefits and alternatives for the proposed anesthesia with the patient or authorized representative who has indicated his/her understanding and acceptance.  ? ? ? ?Dental Advisory Given ? ?Plan Discussed with: CRNA ? ?Anesthesia Plan Comments:   ? ? ? ? ? ? ?Anesthesia Quick Evaluation ? ?

## 2021-06-03 NOTE — Discharge Instructions (Signed)
? ?[facility] ? ?Discharge Instructions ? ?AV Fistula or Graft Surgery for Dialysis Access ? ?Please refer to the following instructions for your post-procedure care. Your surgeon or physician assistant will discuss any changes with you. ? ?Activity ? ?You may drive the day following your surgery, if you are comfortable and no longer taking prescription pain medication. Resume full activity as the soreness in your incision resolves. ? ?Bathing/Showering ? ?You may shower after you go home. Keep your incision dry for 48 hours. Do not soak in a bathtub, hot tub, or swim until the incision heals completely. You may not shower if you have a hemodialysis catheter. ? ?Incision Care ? ?Clean your incision with mild soap and water after 48 hours. Pat the area dry with a clean towel. You do not need a bandage unless otherwise instructed. Do not apply any ointments or creams to your incision. You may have skin glue on your incision. Do not peel it off. It will come off on its own in about one week. Your arm may swell a bit after surgery. To reduce swelling use pillows to elevate your arm so it is above your heart. Your doctor will tell you if you need to lightly wrap your arm with an ACE bandage. ? ?Diet ? ?Resume your normal diet. There are not special food restrictions following this procedure. In order to heal from your surgery, it is CRITICAL to get adequate nutrition. Your body requires vitamins, minerals, and protein. Vegetables are the best source of vitamins and minerals. Vegetables also provide the perfect balance of protein. Processed food has little nutritional value, so try to avoid this. ? ?Medications ? ?Resume taking all of your medications. If your incision is causing pain, you may take over-the counter pain relievers such as acetaminophen (Tylenol). If you were prescribed a stronger pain medication, please be aware these medications can cause nausea and constipation. Prevent  nausea by taking the medication with a snack or meal. Avoid constipation by drinking plenty of fluids and eating foods with high amount of fiber, such as fruits, vegetables, and grains.  ?Do not take Tylenol if you are taking prescription pain medications. ? ?Follow up ?Your surgeon may want to see you in the office following your access surgery. If so, this will be arranged at the time of your surgery. ? ?Please call us immediately for any of the following conditions: ? ?Increased pain, redness, drainage (pus) from your incision site ?Fever of 101 degrees or higher ?Severe or worsening pain at your incision site ?Hand pain or numbness. ? ?Reduce your risk of vascular disease: ? ?Stop smoking. If you would like help, call [facility] at 1-800-QUIT-NOW 267-854-6389) or [place] at (940)[phone] ? ?Manage your cholesterol ?Maintain a desired weight ?Control your diabetes ?Keep your blood pressure down ? ?Dialysis ? ?It will take several weeks to several months for your new dialysis access to be ready for use. Your surgeon will determine when it is okay to use it. Your nephrologist will continue to direct your dialysis. You can continue to use your Permcath until your new access is ready for use. ? ? ?06/03/2021 ?Latoya Walsh ?[id number] ?06-Jan-1961 ? ?Surgeon(s): ?Juleah Paradise, Arvilla Meres, MD ? ?Procedure(s): ?INSERTION OF LEFT UPPER ARM ARTERIOVENOUS (AV) GORE-TEX GRAFT ? ? May stick graft immediately  ? May stick graft on designated area only:   ? Do not stick graft for 4 weeks  ? ? ?If you have any questions, please call the  office at [phone]. ? ? ?

## 2021-06-04 ENCOUNTER — Encounter (HOSPITAL_COMMUNITY): Payer: Self-pay | Admitting: Vascular Surgery

## 2021-06-05 ENCOUNTER — Telehealth: Payer: Self-pay

## 2021-06-05 DIAGNOSIS — N179 Acute kidney failure, unspecified: Secondary | ICD-10-CM | POA: Diagnosis not present

## 2021-06-05 NOTE — Telephone Encounter (Signed)
Returned call to pt - c/o L UA pain 10/10 since surgery. Pt states that she has not been taking her pain medication because of not being able to eat. Pt did state that she has food to eat now and she could take her medicine. Pt denied fever/chills, warmth to the incision site and pain/numbness in hand. Pt did state that she has had some slight numbness but no pain and has not been keeping her L arm elevated. Advised pt to keep arm elevated above the level of the heart as much as possible and to take her pain medication to stay on top of the pain. Advised pt to give our office or go to nearest ED if unable to reach our office, if pain does not get better after taking pain medication or if she has numbness and pain in her L hand. Pt verbalized understanding and agreed with this plan.  ?

## 2021-06-06 DIAGNOSIS — N179 Acute kidney failure, unspecified: Secondary | ICD-10-CM | POA: Diagnosis not present

## 2021-06-07 DIAGNOSIS — N179 Acute kidney failure, unspecified: Secondary | ICD-10-CM | POA: Diagnosis not present

## 2021-06-08 DIAGNOSIS — N179 Acute kidney failure, unspecified: Secondary | ICD-10-CM | POA: Diagnosis not present

## 2021-06-09 ENCOUNTER — Other Ambulatory Visit (HOSPITAL_COMMUNITY): Payer: Medicaid Other

## 2021-06-09 ENCOUNTER — Ambulatory Visit: Payer: Medicaid Other | Admitting: Nurse Practitioner

## 2021-06-09 DIAGNOSIS — N179 Acute kidney failure, unspecified: Secondary | ICD-10-CM | POA: Diagnosis not present

## 2021-06-10 ENCOUNTER — Other Ambulatory Visit: Payer: Self-pay | Admitting: Family Medicine

## 2021-06-10 DIAGNOSIS — N179 Acute kidney failure, unspecified: Secondary | ICD-10-CM | POA: Diagnosis not present

## 2021-06-10 DIAGNOSIS — E1140 Type 2 diabetes mellitus with diabetic neuropathy, unspecified: Secondary | ICD-10-CM

## 2021-06-11 DIAGNOSIS — N179 Acute kidney failure, unspecified: Secondary | ICD-10-CM | POA: Diagnosis not present

## 2021-06-12 ENCOUNTER — Telehealth: Payer: Self-pay

## 2021-06-12 DIAGNOSIS — N179 Acute kidney failure, unspecified: Secondary | ICD-10-CM | POA: Diagnosis not present

## 2021-06-12 NOTE — Telephone Encounter (Signed)
Called [facility] and spoke to [place] and she stated someone from our office called yesterday and had them change to Levemir instead of generic Lantus (insulin glargine). [facility] will send a refill for the Levemir which will cost $4. ?

## 2021-06-13 DIAGNOSIS — N179 Acute kidney failure, unspecified: Secondary | ICD-10-CM | POA: Diagnosis not present

## 2021-06-14 DIAGNOSIS — N179 Acute kidney failure, unspecified: Secondary | ICD-10-CM | POA: Diagnosis not present

## 2021-06-15 DIAGNOSIS — N179 Acute kidney failure, unspecified: Secondary | ICD-10-CM | POA: Diagnosis not present

## 2021-06-16 DIAGNOSIS — N179 Acute kidney failure, unspecified: Secondary | ICD-10-CM | POA: Diagnosis not present

## 2021-06-17 DIAGNOSIS — N179 Acute kidney failure, unspecified: Secondary | ICD-10-CM | POA: Diagnosis not present

## 2021-06-18 DIAGNOSIS — N179 Acute kidney failure, unspecified: Secondary | ICD-10-CM | POA: Diagnosis not present

## 2021-06-19 DIAGNOSIS — N179 Acute kidney failure, unspecified: Secondary | ICD-10-CM | POA: Diagnosis not present

## 2021-06-20 DIAGNOSIS — E1122 Type 2 diabetes mellitus with diabetic chronic kidney disease: Secondary | ICD-10-CM | POA: Diagnosis not present

## 2021-06-20 DIAGNOSIS — N186 End stage renal disease: Secondary | ICD-10-CM | POA: Diagnosis not present

## 2021-06-20 DIAGNOSIS — Z992 Dependence on renal dialysis: Secondary | ICD-10-CM | POA: Diagnosis not present

## 2021-06-20 DIAGNOSIS — N179 Acute kidney failure, unspecified: Secondary | ICD-10-CM | POA: Diagnosis not present

## 2021-06-21 DIAGNOSIS — N179 Acute kidney failure, unspecified: Secondary | ICD-10-CM | POA: Diagnosis not present

## 2021-06-22 DIAGNOSIS — N179 Acute kidney failure, unspecified: Secondary | ICD-10-CM | POA: Diagnosis not present

## 2021-06-23 DIAGNOSIS — N179 Acute kidney failure, unspecified: Secondary | ICD-10-CM | POA: Diagnosis not present

## 2021-06-24 ENCOUNTER — Other Ambulatory Visit: Payer: Self-pay | Admitting: Licensed Clinical Social Worker

## 2021-06-24 DIAGNOSIS — N179 Acute kidney failure, unspecified: Secondary | ICD-10-CM | POA: Diagnosis not present

## 2021-06-24 NOTE — Patient Outreach (Signed)
[facility]) Care Management ? ?06/24/2021 ? ?Latoya Walsh ?12/18/1960 ?[id number] ? ?[facility] LCSW completed call to patient for scheduled SW appointment on 06/24/21 and patient successfully answered. However, patient reports that this is not a good time for her to talk as she is keeping her grandchild. Her grandchild was in the background crying and was in need of patient's full attention. [facility] LCSW rescheduled [facility] LCSW appointment for 06/26/21 at 10:45 am. ? ?Eula Fried, BSW, MSW, LCSW ?Managed Medicaid LCSW ?[facility] ?[email or web address] ?Phone: [phone] ? ? ? ? ?

## 2021-06-24 NOTE — Patient Instructions (Signed)
Visit Information ? ?Ms. Hora was given information about Medicaid Managed Care team care coordination services and verbally consented to engagement with the [facility] Managed Care team. [facility] LCSW will reschedule SW appointment to 06/26/21 at 10:45 am as patient is unable to speak at this time. ? ? ? ?Patient Care Plan: General Social Work (Adult)  ?  ? ?Problem Identified: [facility] AMB "PATIENT-SPECIFIC PROBLEM"   ?Note:   ?CARE PLAN ENTRY ?(see longitudinal plan of care for additional care plan information) ? ?Current Barriers:  ?Knowledge deficits related to accessing mental health provider in patient with Depression  ?Patient is experiencing symptoms of  depression which seem to be exacerbated by the loss of her sister.     ?Patient needs Support, Education, and Care Coordination in order to meet unmet mental health needs  ?Mental Health Concerns  ? ?Clinical Social Work Goal(s):  ?Over the next 90 days, patient will work with SW bi-weekly by telephone or in person to reduce or manage symptoms of depression until connected for ongoing counseling resources.  ?Patient will implement clinical interventions discussed today to decreases symptoms of depression and increase knowledge and/or ability of: coping skills. ? ?Patient Self Care Activities & Deficits:  ?Patient is unable to independently navigate community resource options without care coordination support ?Patient is able to implement clinical interventions discussed today and is motivated for treatment  ?Patient will select one of the agencies from the list provided and call to schedule an appointment ?Performs ADL's independently ?Performs IADL's independently ?Strong family or social support ? ?Please see past updates related to this goal by clicking on the "Past Updates" button in the selected goal  ? ?Eula Fried, BSW, MSW, LCSW ?Managed Medicaid LCSW ?[facility] ?[email or web address] ?Phone: [phone] ? ? ?  ?  ?

## 2021-06-25 DIAGNOSIS — N179 Acute kidney failure, unspecified: Secondary | ICD-10-CM | POA: Diagnosis not present

## 2021-06-26 ENCOUNTER — Ambulatory Visit (HOSPITAL_COMMUNITY): Admission: RE | Admit: 2021-06-26 | Payer: Medicaid Other | Source: Ambulatory Visit

## 2021-06-26 ENCOUNTER — Other Ambulatory Visit: Payer: Self-pay | Admitting: Licensed Clinical Social Worker

## 2021-06-26 DIAGNOSIS — N179 Acute kidney failure, unspecified: Secondary | ICD-10-CM | POA: Diagnosis not present

## 2021-06-26 NOTE — Patient Instructions (Signed)
Visit Information ? ?Ms. Slape was given information about Medicaid Managed Care team care coordination services as a part of their [place] Medicaid benefit. [name] verbally consented to engagement with the [facility] Managed Care team.  ? ?If you are experiencing a medical emergency, please call 911 or report to your local emergency department or urgent care.  ? ?If you have a non-emergency medical problem during routine business hours, please contact your provider's office and ask to speak with a nurse.  ? ?For questions related to your [facility], please call: [phone] or visit the homepage here: [email or web address] ? ?If you would like to schedule transportation through your [facility], please call the following number at least 2 days in advance of your appointment: [phone]. ? Rides for urgent appointments can also be made after hours by calling Member Services. ? ?Call the [facility] at [phone], at any time, 24 hours a day, 7 days a week. If you are in danger or need immediate medical attention call 911. ? ?If you would like help to quit smoking, call 1-800-QUIT-NOW 925-740-4592) OR Espa?ol: 1-855-D?jelo-Ya 843-663-2300) o para m?s informaci?n haga clic aqu? or Text READY to 200-400 to register via text ? ?Ms. Human - following are the goals we discussed in your visit today:  ? Goals Addressed   ? ?  ?  ?  ?  ? This Visit's Progress  ?  Begin and Stick with Counseling-Depression     ?  Timeframe:  Long-Range Goal ?Priority:  Medium ?Start Date:    05/16/21                         ?Expected End Date:     11/13/21                 ? ?Follow Up Date 08/21/21 ?  ?- check out counseling ?- keep 90 percent of counseling appointments ?- schedule counseling appointment  ?  ?Why is this important?   ?Beating depression may take some time.  ?If you  don't feel better right away, don't give up on your treatment plan.  ?  ?Notes:  ?  ? ?  ? ?Following is a copy of your plan of care:  ?Care Plan : General Social Work (Adult)  ?Updates made by Greg Cutter, LCSW since 06/26/2021 12:00 AM  ?  ? ?Problem: I am experiencing grief symptoms   ?Priority: High  ?Onset Date: 05/15/2021  ?Note:   ?CARE PLAN ENTRY ?(see longitudinal plan of care for additional care plan information) ? ? ? ?Priority: High ? ?Timeframe:  Long-Range Goal ?Priority:  High ?Start Date:   05/15/21             ?Expected End Date:  ongoing                   ?  ?Follow Up Date--08/21/21 ? ?Current Barriers:  ?Knowledge deficits related to accessing mental health provider in patient with Depression  ?Patient is experiencing symptoms of  depression which seem to be exacerbated by the loss of her sister.     ?Patient needs Support, Education, and Care Coordination in order to meet unmet mental health needs  ?Mental Health Concerns  ? ?Clinical Social Work Goal(s):  ?Over the next 90 days, patient will work with SW and Engineer, drilling by telephone or in person to reduce or  manage symptoms of depression and grief ?Patient will implement clinical interventions discussed today to decreases symptoms of depression and increase knowledge and/or ability of: coping skills. ? ?Patient Self Care Activities & Deficits:  ?Patient is unable to independently navigate community resource options without care coordination support ?Patient is able to implement clinical interventions discussed today and is motivated for treatment  ?Patient will select one of the agencies from the list provided and call to schedule an appointment ?Performs ADL's independently ?Performs IADL's independently ?Strong family or social support ? ?10 LITTLE Things To Do When You?re Feeling Too Down To Do Anything ? ?Take a shower. ?Even if you plan to stay in all day long and not see a soul, take a shower. It takes the most effort to  hop in to the shower but once you do, you?ll feel immediate results. It will wake you up and you?ll be feeling much fresher (and cleaner too). ? ?Brush and floss your teeth. ?Give your teeth a good brushing with a floss finish. It?s a small task but it feels so good and you can check ?taking care of your health? off the list of things to do. ? ?Do something small on your list. ?Most of [place] have some small thing we would like to get done (load of laundry, sew a button, email a friend). Doing one of these things will make you feel like you?ve accomplished something. ? ?Drink water. ?Drinking water is easy right? It?s also really beneficial for your health so keep a glass beside you all day and take sips often. It gives you energy and prevents you from boredom eating. ? ?Do some floor exercises. ?The last thing you want to do is exercise but it might be just the thing you need the most. Keep it simple and do exercises that involve sitting or laying on the floor. Even the smallest of exercises release chemicals in the brain that make you feel good. Yoga stretches or core exercises are going to make you feel good with minimal effort. ? ?Make your bed. ?Making your bed takes a few minutes but it?s productive and you?ll feel relieved when it?s done. An unmade bed is a huge visual reminder that you?re having an unproductive day. Do it and consider it your housework for the day. ? ?Put on some nice clothes. ?Take the sweatpants off even if you don?t plan to go anywhere. Put on clothes that make you feel good. Take a look in the mirror so your brain recognizes the sweatpants have been replaced with clothes that make you look great. It?s an instant confidence booster. ? ?Wash the dishes. ?A pile of dirty dishes in the sink is a reflection of your mood. It?s possible that if you wash up the dishes, your mood will follow suit. It?s worth a try. ? ?Lacinda Axon a real meal. ?If you have the luxury to have a ?do nothing? day, you have time  to make a real meal for yourself. Make a meal that you love to eat. The process is good to get you out of the funk and the food will ensure you have more energy for tomorrow. ? ?Write out your thoughts by hand. ?When you hand write, you stimulate your brain to focus on the moment that you?re in so make yourself comfortable and write whatever comes into your mind. Put those thoughts out on paper so they stop spinning around in your head. Those thoughts might be the very thing holding you down. ? ?Cheikh Bramble  Dannette Barbara, MSW, LCSW ?Managed Medicaid LCSW ?[facility] ?[email or web address] ?Phone: [phone] ? ?  ?  ?

## 2021-06-26 NOTE — Patient Outreach (Signed)
?Medicaid Managed Care ?Social Work Note ? ?06/26/2021 ?Name:  Latoya Walsh MRN:  [id number] DOB:  September 09, 1960 ? ?Latoya Walsh is an 61 y.o. year old female who is a primary patient of [name], MD.  The Medicaid Managed Care Coordination team was consulted for assistance with:  [place] and Resources ? ?Latoya Walsh was given information about Medicaid Managed Care Coordination team services today. Latoya Walsh Patient agreed to services and verbal consent obtained. ? ?Engaged with patient  for by telephone forinitial visit in response to referral for case management and/or care coordination services.  ? ?Assessments/Interventions:  Review of past medical history, allergies, medications, health status, including review of consultants reports, laboratory and other test data, was performed as part of comprehensive evaluation and provision of chronic care management services. ? ?SDOH: (Social Determinant of Health) assessments and interventions performed: ?SDOH Interventions   ? ?Flowsheet Row Most Recent Value  ?SDOH Interventions   ?Depression Interventions/Treatment  Medication  ? ?  ? ? ?Advanced Directives Status:  Not addressed in this encounter. ? ?Care Plan ?                ?Allergies  ?Allergen Reactions  ? Penicillins Itching and Rash  ? ? ?Medications Reviewed Today   ? ? Reviewed by Louann Sjogren, MD (Physician) on 06/03/21 at 1108  Med List Status: Contact Pharm Tech - Patient bringing meds/list  ? ?Medication Order Taking? Sig Documenting Provider Last Dose Status Informant  ?ACCU-CHEK GUIDE test strip [id number] Yes Use to check glucose twice daily [name], NP 06/02/2021 Active   ?Accu-Chek Softclix Lancets lancets [id number] Yes 2 (two) times daily. [provider] 06/02/2021 Active Multiple Informants  ?amLODipine (NORVASC) 10 MG tablet [id number] Yes Take 10 mg by mouth daily. [provider] 06/02/2021 Active   ?blood glucose meter kit and  supplies [id number] Yes Dispense based on patient and insurance preference. Use to monitor glucose twice daily. [name], NP 06/02/2021 Active Multiple Informants  ?calcitRIOL (ROCALTROL) 0.25 MCG capsule [id number] Yes Take 0.5 mcg by mouth daily. [provider] 06/02/2021 Active Multiple Informants  ?         ?Med Note (ROBB, MELANIE A   Thu Dec 19, 2020  1:12 PM) 9/27-dose change to two tablets daily  ?Cholecalciferol (VITAMIN D3) 25 MCG (1000 UT) CAPS [id number] Yes Take 1 capsule by mouth daily. [provider] 06/02/2021 Active   ?furosemide (LASIX) 80 MG tablet [id number] Yes Take 80 mg  (1 tablet) in the morning and 40 mg (half tablet) in the afternoon. [name], MD 06/02/2021 Active Multiple Informants  ?gabapentin (NEURONTIN) 100 MG capsule [id number] Yes TAKE ONE CAPSULE EVERY MORNING and TAKE ONE TABLET AT NOON and TAKE ONE CAPSULE EVERYDAY AT BEDTIME [name], MD 06/02/2021 Active   ?glipiZIDE (GLUCOTROL XL) 5 MG 24 hr tablet [id number] Yes TAKE ONE TABLET BY MOUTH EVERY MORNING [name], MD 06/02/2021 Active   ?hydrALAZINE (APRESOLINE) 50 MG tablet [id number] Yes TAKE ONE TABLET BY MOUTH THREE TIMES DAILY [name], MD 06/02/2021 Active   ?insulin glargine (LANTUS) 100 UNIT/ML Solostar Pen [id number] Yes Inject 35 Units into the skin at bedtime. [name], NP 06/02/2021 Active   ?Insulin Pen Needle (PEN NEEDLES) 31G X 5 MM MISC [id number] Yes Use to inject insulin once daily [name], NP 06/02/2021 Active Multiple Informants  ?isosorbide mononitrate (IMDUR) 30 MG 24 hr tablet [id number]  Take 1  tablet (30 mg total) by mouth daily. [name], MD  Expired 05/30/21 2359 Multiple Informants  ?         ?Med Note [name], CATHERINE A   Fri May 30, 2021  2:35 PM)    ?isosorbide mononitrate (IMDUR) 30 MG 24 hr tablet [id number] Yes Take 1 tablet (30 mg total) by mouth daily. [name], MD 06/02/2021 Active   ?metoprolol  succinate (TOPROL-XL) 50 MG 24 hr tablet [id number] Yes Take 1 tablet (50 mg total) by mouth daily. Take with or immediately following a meal. [name], MD 06/02/2021 Active   ?mirtazapine (REMERON) 15 MG tablet [id number] Yes TAKE ONE TABLET BY MOUTH EVERY EVENING [name], MD 06/02/2021 Active   ?rosuvastatin (CRESTOR) 10 MG tablet [id number] Yes Take 1 tablet (10 mg total) by mouth daily. [name], MD 06/02/2021 Active Multiple Informants  ?sevelamer carbonate (RENVELA) 800 MG tablet [id number] Yes Take 800 mg by mouth 3 (three) times daily with meals. [provider] 06/02/2021 Active Multiple Informants  ?         ?Med Note (ROBB, MELANIE A   Thu Dec 19, 2020  1:13 PM) This medication to be delivered today 12/19/20  ?sodium bicarbonate 650 MG tablet [id number] Yes Take 650 mg by mouth 3 (three) times daily. [provider] 06/02/2021 Active Multiple Informants  ?Med List Note [name], [place] 58/52/77 [id number]):    ? ?  ?  ? ?  ? ? ?Patient Active Problem List  ? Diagnosis Date Noted  ? ESRD (end stage renal disease) ([place]) 01/06/2021  ? CHF (congestive heart failure) ([place]) 06/06/2020  ? Leg swelling 04/28/2020  ? Hyperlipidemia LDL goal <100 04/28/2020  ? Left hand pain 03/10/2020  ? Anemia 02/19/2020  ? Depression, major, single episode, in partial remission ([place]) 01/25/2019  ? Headache 11/13/2018  ? Insomnia 09/25/2018  ? Screening for colorectal cancer 07/23/2016  ? Lumbar back pain with radiculopathy affecting right lower extremity 03/27/2015  ? Non compliance with medical treatment 07/22/2014  ? Chronic hepatitis C without hepatic coma ([place]) 12/04/2013  ? Allergic rhinitis 11/03/2012  ? Severe hypertension 06/25/2011  ? Anxiety and depression 02/05/2011  ? Nicotine dependence 11/01/2010  ? Drug dependence, continuous abuse ([place]) 03/22/2010  ? Uncontrolled type 2 diabetes mellitus with hyperglycemia ([place]) 06/02/2007  ? Alcohol abuse 06/02/2007  ? ? ?Conditions to be  addressed/monitored per PCP order:  Depression ? ?Care Plan : General Social Work (Adult)  ?Updates made by Greg Cutter, LCSW since 06/26/2021 12:00 AM  ?  ? ?Problem: I am experiencing grief symptoms   ?Priority: High  ?Onset Date: 05/15/2021  ?Note:   ?CARE PLAN ENTRY ?(see longitudinal plan of care for additional care plan information) ? ? ? ?Priority: High ? ?Timeframe:  Long-Range Goal ?Priority:  High ?Start Date:   05/15/21             ?Expected End Date:  ongoing                   ?  ?Follow Up Date--08/21/21 ? ?Current Barriers:  ?Knowledge deficits related to accessing mental health provider in patient with Depression  ?Patient is experiencing symptoms of  depression which seem to be exacerbated by the loss of her sister.     ?Patient needs Support, Education, and Care Coordination in order to meet unmet mental health needs  ?Mental Health Concerns  ? ?Clinical Social Work Goal(s):  ?Over the next  90 days, patient will work with SW and Engineer, drilling by telephone or in person to reduce or manage symptoms of depression and grief ?Patient will implement clinical interventions discussed today to decreases symptoms of depression and increase knowledge and/or ability of: coping skills. ? ?Interventions:  ?Assessed patient's understanding, education, previous treatment and care coordination needs  ?Patient continues to discuss difficulty in dealing with the recent loss of her sister evidenced by depressed mood, insomnia and crying spells ?Patient confirms having family support, lives with her daughter at this time but is working with her Education officer, museum to find her own place. Dialysis continues on Monday, Wednesdays and Fridays-vans provides transport ?Patient reports that she goes to bible study weekly and watches church sermons on Sundays. She reports that her church provides stable transportation to and from bible study during the week. ?Patient reports that she uses Medicaid  transportation ?Provided basic mental health support, education and interventions  ?Grief response normalized, emotional support provided, grief counseling encouraged ?Collaborated with appropriate clinical care team members rega

## 2021-06-27 DIAGNOSIS — N179 Acute kidney failure, unspecified: Secondary | ICD-10-CM | POA: Diagnosis not present

## 2021-06-28 DIAGNOSIS — N179 Acute kidney failure, unspecified: Secondary | ICD-10-CM | POA: Diagnosis not present

## 2021-06-29 DIAGNOSIS — N179 Acute kidney failure, unspecified: Secondary | ICD-10-CM | POA: Diagnosis not present

## 2021-06-30 ENCOUNTER — Other Ambulatory Visit: Payer: Self-pay | Admitting: Internal Medicine

## 2021-06-30 ENCOUNTER — Other Ambulatory Visit: Payer: Self-pay | Admitting: Family Medicine

## 2021-06-30 DIAGNOSIS — M79642 Pain in left hand: Secondary | ICD-10-CM

## 2021-06-30 DIAGNOSIS — I10 Essential (primary) hypertension: Secondary | ICD-10-CM

## 2021-06-30 DIAGNOSIS — N179 Acute kidney failure, unspecified: Secondary | ICD-10-CM | POA: Diagnosis not present

## 2021-07-01 DIAGNOSIS — N179 Acute kidney failure, unspecified: Secondary | ICD-10-CM | POA: Diagnosis not present

## 2021-07-02 DIAGNOSIS — N179 Acute kidney failure, unspecified: Secondary | ICD-10-CM | POA: Diagnosis not present

## 2021-07-03 DIAGNOSIS — N179 Acute kidney failure, unspecified: Secondary | ICD-10-CM | POA: Diagnosis not present

## 2021-07-04 DIAGNOSIS — N179 Acute kidney failure, unspecified: Secondary | ICD-10-CM | POA: Diagnosis not present

## 2021-07-05 DIAGNOSIS — N179 Acute kidney failure, unspecified: Secondary | ICD-10-CM | POA: Diagnosis not present

## 2021-07-06 DIAGNOSIS — N179 Acute kidney failure, unspecified: Secondary | ICD-10-CM | POA: Diagnosis not present

## 2021-07-07 DIAGNOSIS — N179 Acute kidney failure, unspecified: Secondary | ICD-10-CM | POA: Diagnosis not present

## 2021-07-08 DIAGNOSIS — N179 Acute kidney failure, unspecified: Secondary | ICD-10-CM | POA: Diagnosis not present

## 2021-07-09 ENCOUNTER — Encounter: Payer: Medicaid Other | Admitting: Vascular Surgery

## 2021-07-09 ENCOUNTER — Ambulatory Visit (INDEPENDENT_AMBULATORY_CARE_PROVIDER_SITE_OTHER): Payer: Medicaid Other | Admitting: Vascular Surgery

## 2021-07-09 ENCOUNTER — Encounter: Payer: Self-pay | Admitting: Vascular Surgery

## 2021-07-09 VITALS — BP 159/87 | HR 93 | Temp 100.2°F | Resp 14 | Wt 172.6 lb

## 2021-07-09 DIAGNOSIS — Z992 Dependence on renal dialysis: Secondary | ICD-10-CM

## 2021-07-09 DIAGNOSIS — N179 Acute kidney failure, unspecified: Secondary | ICD-10-CM | POA: Diagnosis not present

## 2021-07-09 DIAGNOSIS — N186 End stage renal disease: Secondary | ICD-10-CM

## 2021-07-09 NOTE — H&P (View-Only) (Signed)
? ?[facility] [place] ? ?Patient name: Latoya Walsh MRN: [id number] DOB: December 01, 1960 Sex: female ? ?REASON FOR VISIT: Follow-up left upper arm AV Gore-Tex graft placement on 06/03/2021 ? ?HPI: ?Latoya Walsh is a 61 y.o. female here today for follow-up.  She currently has dialysis via a right IJ tunneled catheter.  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  She had placement of a left upper arm AV Gore-Tex graft on 06/03/2021.  She had a 4-7 tapered graft placed to minimize risk for steal.  Today she describes very clear and tolerable steal symptoms in her left hand.  She does continue to have some discomfort from her graft placement in her left antecubital space axilla and upper arm.  She also however has very clear-cut description of cold sensation in her left hand and aching which is particularly bad while on dialysis.  She reports that she is frequently had to have her dialysis session stopped due to hand pain. ? ?Current Outpatient Medications  ?Medication Sig Dispense Refill  ? ACCU-CHEK GUIDE test strip Use to check glucose twice daily 100 each 11  ? Accu-Chek Softclix Lancets lancets 2 (two) times daily.    ? amLODipine (NORVASC) 10 MG tablet Take 10 mg by mouth daily.    ? blood glucose meter kit and supplies Dispense based on patient and insurance preference. Use to monitor glucose twice daily. 1 each 0  ? calcitRIOL (ROCALTROL) 0.25 MCG capsule Take 0.5 mcg by mouth daily.    ? Cholecalciferol (VITAMIN D3) 25 MCG (1000 UT) CAPS Take 1 capsule by mouth daily.    ? furosemide (LASIX) 80 MG tablet Take 80 mg  (1 tablet) in the morning and 40 mg (half tablet) in the afternoon. 135 tablet 3  ? gabapentin (NEURONTIN) 100 MG capsule TAKE ONE CAPSULE BY MOUTH EVERY MORNING and TAKE ONE CAPSULE BY MOUTH EVERYDAY AT BEDTIME 90 capsule 1  ? glipiZIDE (GLUCOTROL XL) 5 MG 24 hr tablet TAKE ONE TABLET BY  MOUTH EVERY MORNING 30 tablet 0  ? hydrALAZINE (APRESOLINE) 50 MG tablet TAKE ONE TABLET BY MOUTH THREE TIMES DAILY 90 tablet 3  ? insulin glargine (LANTUS) 100 UNIT/ML Solostar Pen Inject 35 Units into the skin at bedtime. 15 mL 1  ? Insulin Pen Needle (PEN NEEDLES) 31G X 5 MM MISC Use to inject insulin once daily 90 each 3  ? isosorbide mononitrate (IMDUR) 30 MG 24 hr tablet Take 1 tablet (30 mg total) by mouth daily. 90 tablet 3  ? isosorbide mononitrate (IMDUR) 30 MG 24 hr tablet Take 1 tablet (30 mg total) by mouth daily. 30 tablet 5  ? metoprolol succinate (TOPROL-XL) 50 MG 24 hr tablet Take 1 tablet (50 mg total) by mouth daily. Take with or immediately following a meal. 30 tablet 3  ? mirtazapine (REMERON) 15 MG tablet TAKE ONE TABLET BY MOUTH EVERY EVENING 90 tablet 1  ? oxyCODONE-acetaminophen (PERCOCET) 5-325 MG tablet Take 1 tablet by mouth every 6 (six) hours as needed for severe pain. 8 tablet 0  ? rosuvastatin (CRESTOR) 10 MG tablet Take 1 tablet (10 mg total) by mouth daily. 90 tablet 3  ? sevelamer carbonate (RENVELA) 800 MG tablet Take 800 mg by mouth 3 (three) times daily with meals.    ? sodium bicarbonate 650 MG tablet Take 650 mg by mouth 3 (three) times daily.    ? ?No current facility-administered medications for this visit.  ? ? ? ?  PHYSICAL EXAM: ?Vitals:  ? 07/09/21 1041  ?BP: (!) 159/87  ?Pulse: 93  ?Resp: 14  ?Temp: 100.2 ?F (37.9 ?C)  ?TempSrc: Temporal  ?SpO2: 97%  ?Weight: 172 lb 9.6 oz (78.3 kg)  ? ? ?GENERAL: The patient is a well-nourished female, in no acute distress. The vital signs are documented above. ?Her left antecubital and x-ray incision are well-healed.  She has an excellent thrill in her AV Gore-Tex graft.  She does have an easily palpable left radial pulse. ? 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  I discussed this at length with the patient.  She is unable to tolerate this level of steal.  I feel that she will have a very  difficult time with long-term access.  Feel her best option currently would be ligation of her graft to relieve her steal symptoms.  Feel that she would also be at high risk for steal in her right arm and she is right arm dominant.  We will plan ligation of her fistula and then watch out if she has clinical resolution of her discomfort before making any plans for future access.  We have scheduled her surgery for this coming Tuesday, 07/15/2021 ? ? ?[name], MD FACS ?[facility] [place] ?Office Tel [phone] ? ?Note: Portions of this report may have been transcribed using voice recognition software.  Every effort has been made to ensure accuracy; however, inadvertent computerized transcription errors may still be present. ?

## 2021-07-09 NOTE — Progress Notes (Signed)
? ?[facility] [place] ? ?Patient name: Latoya Walsh MRN: [id number] DOB: Jun 03, 1960 Sex: female ? ?REASON FOR VISIT: Follow-up left upper arm AV Gore-Tex graft placement on 06/03/2021 ? ?HPI: ?[name] is a 61 y.o. female here today for follow-up.  She currently has dialysis via a right IJ tunneled catheter.  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  She had placement of a left upper arm AV Gore-Tex graft on 06/03/2021.  She had a 4-7 tapered graft placed to minimize risk for steal.  Today she describes very clear and tolerable steal symptoms in her left hand.  She does continue to have some discomfort from her graft placement in her left antecubital space axilla and upper arm.  She also however has very clear-cut description of cold sensation in her left hand and aching which is particularly bad while on dialysis.  She reports that she is frequently had to have her dialysis session stopped due to hand pain. ? ?Current Outpatient Medications  ?Medication Sig Dispense Refill  ? ACCU-CHEK GUIDE test strip Use to check glucose twice daily 100 each 11  ? Accu-Chek Softclix Lancets lancets 2 (two) times daily.    ? amLODipine (NORVASC) 10 MG tablet Take 10 mg by mouth daily.    ? blood glucose meter kit and supplies Dispense based on patient and insurance preference. Use to monitor glucose twice daily. 1 each 0  ? calcitRIOL (ROCALTROL) 0.25 MCG capsule Take 0.5 mcg by mouth daily.    ? Cholecalciferol (VITAMIN D3) 25 MCG (1000 UT) CAPS Take 1 capsule by mouth daily.    ? furosemide (LASIX) 80 MG tablet Take 80 mg  (1 tablet) in the morning and 40 mg (half tablet) in the afternoon. 135 tablet 3  ? gabapentin (NEURONTIN) 100 MG capsule TAKE ONE CAPSULE BY MOUTH EVERY MORNING and TAKE ONE CAPSULE BY MOUTH EVERYDAY AT BEDTIME 90 capsule 1  ? glipiZIDE (GLUCOTROL XL) 5 MG 24 hr tablet TAKE ONE TABLET BY  MOUTH EVERY MORNING 30 tablet 0  ? hydrALAZINE (APRESOLINE) 50 MG tablet TAKE ONE TABLET BY MOUTH THREE TIMES DAILY 90 tablet 3  ? insulin glargine (LANTUS) 100 UNIT/ML Solostar Pen Inject 35 Units into the skin at bedtime. 15 mL 1  ? Insulin Pen Needle (PEN NEEDLES) 31G X 5 MM MISC Use to inject insulin once daily 90 each 3  ? isosorbide mononitrate (IMDUR) 30 MG 24 hr tablet Take 1 tablet (30 mg total) by mouth daily. 90 tablet 3  ? isosorbide mononitrate (IMDUR) 30 MG 24 hr tablet Take 1 tablet (30 mg total) by mouth daily. 30 tablet 5  ? metoprolol succinate (TOPROL-XL) 50 MG 24 hr tablet Take 1 tablet (50 mg total) by mouth daily. Take with or immediately following a meal. 30 tablet 3  ? mirtazapine (REMERON) 15 MG tablet TAKE ONE TABLET BY MOUTH EVERY EVENING 90 tablet 1  ? oxyCODONE-acetaminophen (PERCOCET) 5-325 MG tablet Take 1 tablet by mouth every 6 (six) hours as needed for severe pain. 8 tablet 0  ? rosuvastatin (CRESTOR) 10 MG tablet Take 1 tablet (10 mg total) by mouth daily. 90 tablet 3  ? sevelamer carbonate (RENVELA) 800 MG tablet Take 800 mg by mouth 3 (three) times daily with meals.    ? sodium bicarbonate 650 MG tablet Take 650 mg by mouth 3 (three) times daily.    ? ?No current facility-administered medications for this visit.  ? ? ? ?  PHYSICAL EXAM: ?Vitals:  ? 07/09/21 1041  ?BP: (!) 159/87  ?Pulse: 93  ?Resp: 14  ?Temp: 100.2 ?F (37.9 ?C)  ?TempSrc: Temporal  ?SpO2: 97%  ?Weight: 172 lb 9.6 oz (78.3 kg)  ? ? ?GENERAL: The patient is a well-nourished female, in no acute distress. The vital signs are documented above. ?Her left antecubital and x-ray incision are well-healed.  She has an excellent thrill in her AV Gore-Tex graft.  She does have an easily palpable left radial pulse. ? 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  I discussed this at length with the patient.  She is unable to tolerate this level of steal.  I feel that she will have a very  difficult time with long-term access.  Feel her best option currently would be ligation of her graft to relieve her steal symptoms.  Feel that she would also be at high risk for steal in her right arm and she is right arm dominant.  We will plan ligation of her fistula and then watch out if she has clinical resolution of her discomfort before making any plans for future access.  We have scheduled her surgery for this coming Tuesday, 07/15/2021 ? ? ?[name], MD FACS ?[facility] [place] ?Office Tel [phone] ? ?Note: Portions of this report may have been transcribed using voice recognition software.  Every effort has been made to ensure accuracy; however, inadvertent computerized transcription errors may still be present. ?

## 2021-07-10 ENCOUNTER — Other Ambulatory Visit: Payer: Self-pay | Admitting: *Deleted

## 2021-07-10 DIAGNOSIS — N179 Acute kidney failure, unspecified: Secondary | ICD-10-CM | POA: Diagnosis not present

## 2021-07-10 NOTE — Patient Outreach (Signed)
?Medicaid Managed Care   ?Nurse Care Manager Note ? ?07/10/2021 ?Name:  Latoya Walsh MRN:  [id number] DOB:  10/13/1960 ? ?Latoya Walsh is an 61 y.o. year old female who is a primary patient of [name], MD.  The [facility] Managed Care Coordination team was consulted for assistance with:    ?CHF ?DMII ? ?[name] was given information about Medicaid Managed Care Coordination team services today. Latoya Walsh Patient agreed to services and verbal consent obtained. ? ?Engaged with patient by telephone for initial visit ? in response to provider referral for case management and/or care coordination services.  ? ?Assessments/Interventions:  Review of past medical history, allergies, medications, health status, including review of consultants reports, laboratory and other test data, was performed as part of comprehensive evaluation and provision of chronic care management services. ? ?SDOH (Social Determinants of Health) assessments and interventions performed: ?SDOH Interventions   ? ?Flowsheet Row Most Recent Value  ?SDOH Interventions   ?Housing Interventions Intervention Not Indicated  Latoya Walsh is currently living with her daughter]  ? ?  ? ? ?Care Plan ? ?Allergies  ?Allergen Reactions  ? Penicillins Itching and Rash  ? ? ?Medications Reviewed Today   ? ? Reviewed by Deasia Montane, RN (Registered Nurse) on 07/10/21 at 34  Med List Status: <None>  ? ?Medication Order Taking? Sig Documenting Provider Last Dose Status Informant  ?ACCU-CHEK GUIDE test strip [id number] No Use to check glucose twice daily  ?Patient not taking: Reported on 07/10/2021  ? [name], NP Not Taking Active   ?Accu-Chek Softclix Lancets lancets [id number] No 2 (two) times daily.  ?Patient not taking: Reported on 07/10/2021  ? [provider] Not Taking Active Multiple Informants  ?amLODipine (NORVASC) 10 MG tablet [id number] Yes Take 10 mg by mouth daily. [provider] Taking Active   ?blood  glucose meter kit and supplies [id number] No Dispense based on patient and insurance preference. Use to monitor glucose twice daily.  ?Patient not taking: Reported on 07/10/2021  ? [name], NP Not Taking Active Multiple Informants  ?calcitRIOL (ROCALTROL) 0.25 MCG capsule [id number] Yes Take 0.5 mcg by mouth daily. [provider] Taking Active Multiple Informants  ?         ?Med Note (Shakeem Stern A   Thu Dec 19, 2020  1:12 PM) 9/27-dose change to two tablets daily  ?Cholecalciferol (VITAMIN D3) 25 MCG (1000 UT) CAPS [id number] Yes Take 1 capsule by mouth daily. [provider] Taking Active   ?furosemide (LASIX) 80 MG tablet [id number] Yes Take 80 mg  (1 tablet) in the morning and 40 mg (half tablet) in the afternoon. [name], MD Taking Active Multiple Informants  ?gabapentin (NEURONTIN) 100 MG capsule [id number] Yes TAKE ONE CAPSULE BY MOUTH EVERY MORNING and TAKE ONE CAPSULE BY MOUTH EVERYDAY AT BEDTIME [name], MD Taking Active   ?glipiZIDE (GLUCOTROL XL) 5 MG 24 hr tablet [id number] Yes TAKE ONE TABLET BY MOUTH EVERY MORNING [name], MD Taking Active   ?hydrALAZINE (APRESOLINE) 50 MG tablet [id number] Yes TAKE ONE TABLET BY MOUTH THREE TIMES DAILY [name], MD Taking Active   ?insulin glargine (LANTUS) 100 UNIT/ML Solostar Pen [id number] No Inject 35 Units into the skin at bedtime.  ?Patient not taking: Reported on 07/10/2021  ? [name], NP Not Taking Active   ?Insulin Pen Needle (PEN NEEDLES) 31G X 5 MM MISC [id number]  Use to inject insulin once daily [name],  [name], NP  Active Multiple Informants  ?isosorbide mononitrate (IMDUR) 30 MG 24 hr tablet [id number]  Take 1 tablet (30 mg total) by mouth daily. [name], MD  Expired 05/30/21 2359 Multiple Informants  ?         ?Med Note [name], CATHERINE A   Fri May 30, 2021  2:35 PM)    ?isosorbide mononitrate (IMDUR) 30 MG 24 hr tablet [id number] Yes Take 1 tablet (30 mg total)  by mouth daily. [name], MD Taking Active   ?metoprolol succinate (TOPROL-XL) 50 MG 24 hr tablet [id number] Yes Take 1 tablet (50 mg total) by mouth daily. Take with or immediately following a meal. [name], MD Taking Active   ?mirtazapine (REMERON) 15 MG tablet [id number] Yes TAKE ONE TABLET BY MOUTH EVERY EVENING [name], MD Taking Active   ?oxyCODONE-acetaminophen (PERCOCET) 5-325 MG tablet [id number] No Take 1 tablet by mouth every 6 (six) hours as needed for severe pain.  ?Patient not taking: Reported on 07/10/2021  ? Latoya Posner, MD Not Taking Active   ?         ?Med Note (Oseias Horsey A   Thu Jul 10, 2021 10:46 AM) Ran out  ?rosuvastatin (CRESTOR) 10 MG tablet [id number] Yes Take 1 tablet (10 mg total) by mouth daily. [name], MD Taking Active Multiple Informants  ?sevelamer carbonate (RENVELA) 800 MG tablet [id number] Yes Take 800 mg by mouth 3 (three) times daily with meals. [provider] Taking Active Multiple Informants  ?         ?Med Note (Tranise Forrest A   Thu Dec 19, 2020  1:13 PM) This medication to be delivered today 12/19/20  ?sodium bicarbonate 650 MG tablet [id number] Yes Take 650 mg by mouth 3 (three) times daily. [provider] Taking Active Multiple Informants  ?Med List Note Latoya Walsh, [place] [birth year] 3845):    ? ?  ?  ? ?  ? ? ?Patient Active Problem List  ? Diagnosis Date Noted  ? ESRD (end stage renal disease) ([place]) 01/06/2021  ? CHF (congestive heart failure) ([place]) 06/06/2020  ? Leg swelling 04/28/2020  ? Hyperlipidemia LDL goal <100 04/28/2020  ? Left hand pain 03/10/2020  ? Anemia 02/19/2020  ? Depression, major, single episode, in partial remission ([place]) 01/25/2019  ? Headache 11/13/2018  ? Insomnia 09/25/2018  ? Screening for colorectal cancer 07/23/2016  ? Lumbar back pain with radiculopathy affecting right lower extremity 03/27/2015  ? Non compliance with medical treatment 07/22/2014  ? Chronic hepatitis C without  hepatic coma ([place]) 12/04/2013  ? Allergic rhinitis 11/03/2012  ? Severe hypertension 06/25/2011  ? Anxiety and depression 02/05/2011  ? Nicotine dependence 11/01/2010  ? Drug dependence, continuous abuse ([place]) 03/22/2010  ? Uncontrolled type 2 diabetes mellitus with hyperglycemia ([place]) 06/02/2007  ? Alcohol abuse 06/02/2007  ? ? ?Conditions to be addressed/monitored per PCP order:  CHF and DMII ? ?Care Plan : RN Care Manager Plan of Care  ?Updates made by Glenola Montane, RN since 07/10/2021 12:00 AM  ?  ? ?Problem: Health Management needs related to DMII and HF   ?  ? ?Long-Range Goal: Development of Plan of Care to address Health Management needs related to DMII and HF   ?Start Date: 07/10/2021  ?Expected End Date: 10/08/2021  ?Priority: High  ?Note:   ?Current Barriers:  ?Chronic Disease Management support and education needs related to CHF and DMII Ms. Fair is having difficulty managing  her DMII due to left hand/arm pain. She will have a procedure next week(ligation of graft) to attempt to relieve some of the pain. She has not been checking her blood sugars or taking insulin. She has not attended appointments with Endocrinology or Echocardiogram. She would like for [facility] to reach out more frequently to remind her of her appointments. ? ?RNCM Clinical Goal(s):  ?Patient will verbalize understanding of plan for management of CHF and DMII as evidenced by patient verbalization of self monitoring activities ?take all medications exactly as prescribed and will call provider for medication related questions as evidenced by documentation in EMR    ?attend all scheduled medical appointments: Dialysis on M/W/F, 07/15/21 for Graft procedure,08/06/21 with PCP and rescheduling Endocrinology and Echocardiogram as evidenced by provider documentation         ? ?Interventions: ?Inter-disciplinary care team collaboration (see longitudinal plan of care) ?Evaluation of current treatment plan related to  self management and patient's  adherence to plan as established by provider ? ? ?Heart Failure Interventions:  (Status: New goal.)  Long Term Goal  ?Wt Readings from Last 3 Encounters:  ?07/09/21 172 lb 9.6 oz (78.3 kg)  ?05/30/21 176 lb

## 2021-07-10 NOTE — Patient Instructions (Addendum)
Visit Information ? ?Ms. Dannemiller was given information about Medicaid Managed Care team care coordination services as a part of their [place] Medicaid benefit. [name] verbally consented to engagement with the [facility] Managed Care team.  ? ?If you are experiencing a medical emergency, please call 911 or report to your local emergency department or urgent care.  ? ?If you have a non-emergency medical problem during routine business hours, please contact your provider's office and ask to speak with a nurse.  ? ?For questions related to your [facility], please call: [phone] or visit the homepage here: [email or web address] ? ?If you would like to schedule transportation through your [facility], please call the following number at least 2 days in advance of your appointment: [phone]. ? Rides for urgent appointments can also be made after hours by calling Member Services. ? ?Call the [place] at [phone], at any time, 24 hours a day, 7 days a week. If you are in danger or need immediate medical attention call 911. ? ?If you would like help to quit smoking, call 1-800-QUIT-NOW (570)062-2396) OR Espa?ol: 1-855-D?jelo-Ya [phone]) o para m?s informaci?n haga clic aqu? or Text READY to 200-400 to register via text ? ?Ms. [name], ? ?Please see education materials related to CHF and DM provided as print materials.  ? ?The patient verbalized understanding of instructions, educational materials, and care plan provided today and agreed to receive a mailed copy of patient instructions, educational materials, and care plan.  ? ?Telephone follow up appointment with Managed Medicaid care management team member scheduled for:07/17/21 @ 11:15am ? ?[name] RN, BSN ?[place] ?RN Care Coordinator ? ? ?Following is a copy of  your plan of care:  ?Care Plan : [place] of Care  ?Updates made by Leanette Montane, RN since 07/10/2021 12:00 AM  ?  ? ?Problem: Health Management needs related to DMII and HF   ?  ? ?Long-Range Goal: Development of Plan of Care to address Health Management needs related to DMII and HF   ?Start Date: 07/10/2021  ?Expected End Date: 10/08/2021  ?Priority: High  ?Note:   ?Current Barriers:  ?Chronic Disease Management support and education needs related to CHF and DMII Ms. Odonohue is having difficulty managing her DMII due to left hand/arm pain. She will have a procedure next week(ligation of graft) to attempt to relieve some of the pain. She has not been checking her blood sugars or taking insulin. She has not attended appointments with Endocrinology or Echocardiogram. She would like for [facility] to reach out more frequently to remind her of her appointments. ? ?RNCM Clinical Goal(s):  ?Patient will verbalize understanding of plan for management of CHF and DMII as evidenced by patient verbalization of self monitoring activities ?take all medications exactly as prescribed and will call provider for medication related questions as evidenced by documentation in EMR    ?attend all scheduled medical appointments: Dialysis on M/W/F, 07/15/21 for Graft procedure,08/06/21 with PCP and rescheduling Endocrinology and Echocardiogram as evidenced by provider documentation         ? ?Interventions: ?Inter-disciplinary care team collaboration (see longitudinal plan of care) ?Evaluation of current treatment plan related to  self management and patient's adherence to plan as established by provider ? ? ?Heart Failure Interventions:  (Status: New goal.)  Long Term Goal  ?Wt Readings from Last 3 Encounters:  ?07/09/21 172 lb 9.6 oz (  78.3 kg)  ?05/30/21 176 lb (79.8 kg)  ?05/14/21 173 lb 3.2 oz (78.6 kg)  ? ?Basic overview and discussion of pathophysiology of Heart Failure reviewed ?Provided education on low sodium diet ?Discussed  the importance of keeping all appointments with provider ?Assessed social determinant of health barriers ?Message sent to scheduling for assistance with rescheduling missed echocardiogram on 06/26/21 ? ?Diabetes:  (Status: New goal.) Long Term Goal  ? ?Lab Results  ?Component Value Date  ? HGBA1C 9.4 (A) 03/11/2021  ? @ ?Assessed patient's understanding of A1c goal: <8% ?Provided education to patient about basic DM disease process; ?Reviewed medications with patient and discussed importance of medication adherence;        ?Counseled on importance of regular laboratory monitoring as prescribed;        ?Discussed plans with patient for ongoing care management follow up and provided patient with direct contact information for care management team;      ?Review of patient status, including review of consultants reports, relevant laboratory and other test results, and medications completed;       ?Assessed social determinant of health barriers;        ?Attempted to reschedule missed appointment with Endocrinology-patient must have lab work completed prior to scheduling missed appointment ?Advised patient to have labs drawn, must be fasting, labs may be drawn at PCP office-Patient will plan for lab work next Thursday ?RNCM will follow up Friday after labs are drawn to assist with scheduling Endocrinology appointment ?Discussed ways to increase compliance for insulin regimen, patient reports her daughter can help her and knows how to administer insulin ? ?Patient Goals/Self-Care Activities: ?Take medications as prescribed   ?Attend all scheduled provider appointments ?Call provider office for new concerns or questions  ?use salt in moderation ?eat more whole grains, fruits and vegetables, lean meats and healthy fats ?dress right for the weather, hot or cold ?Complete requested lab work ? ? ?  ?  ?

## 2021-07-11 ENCOUNTER — Other Ambulatory Visit: Payer: Self-pay | Admitting: Family Medicine

## 2021-07-11 DIAGNOSIS — E1140 Type 2 diabetes mellitus with diabetic neuropathy, unspecified: Secondary | ICD-10-CM

## 2021-07-12 DIAGNOSIS — N179 Acute kidney failure, unspecified: Secondary | ICD-10-CM | POA: Diagnosis not present

## 2021-07-13 DIAGNOSIS — N179 Acute kidney failure, unspecified: Secondary | ICD-10-CM | POA: Diagnosis not present

## 2021-07-14 DIAGNOSIS — N179 Acute kidney failure, unspecified: Secondary | ICD-10-CM | POA: Diagnosis not present

## 2021-07-15 ENCOUNTER — Other Ambulatory Visit: Payer: Self-pay | Admitting: *Deleted

## 2021-07-15 DIAGNOSIS — N179 Acute kidney failure, unspecified: Secondary | ICD-10-CM | POA: Diagnosis not present

## 2021-07-15 NOTE — Patient Instructions (Signed)
Visit Information ? ?Latoya Walsh was given information about Medicaid Managed Care team care coordination services as a part of their [place] Medicaid benefit. Latoya Walsh verbally consented to engagement with the [facility] Managed Care team.  ? ?If you are experiencing a medical emergency, please call 911 or report to your local emergency department or urgent care.  ? ?If you have a non-emergency medical problem during routine business hours, please contact your provider's office and ask to speak with a nurse.  ? ?For questions related to your [facility], please call: [phone] or visit the homepage here: [email or web address] ? ?If you would like to schedule transportation through your [facility], please call the following number at least 2 days in advance of your appointment: [phone]. ? Rides for urgent appointments can also be made after hours by calling Member Services. ? ?Call the [facility] at [phone], at any time, 24 hours a day, 7 days a week. If you are in danger or need immediate medical attention call 911. ? ?If you would like help to quit smoking, call 1-800-QUIT-NOW [phone]) OR Espa?ol: 1-855-D?jelo-Ya (563)583-9090) o para m?s informaci?n haga clic aqu? or Text READY to 200-400 to register via text ? ?[name], ? ? ?Please see education materials related to HF provided by MyChart link. ? ?Patient verbalizes understanding of instructions and care plan provided today and agrees to view in [place]. Active MyChart status confirmed with patient.   ? ?Telephone follow up appointment with Managed Medicaid care management team member scheduled for:07/22/21 @ 2:30pm ? ?[name] RN, BSN ?[place] ?RN Care Coordinator ? ? ?Following is a copy of your plan of care:  ?Care Plan : [place] of Care   ?Updates made by Jalin Montane, RN since 07/15/2021 12:00 AM  ?  ? ?Problem: Health Management needs related to DMII and HF   ?  ? ?Long-Range Goal: Development of Plan of Care to address Health Management needs related to DMII and HF   ?Start Date: 07/10/2021  ?Expected End Date: 10/08/2021  ?Priority: High  ?Note:   ?Current Barriers:  ?Chronic Disease Management support and education needs related to CHF and DMII Latoya Walsh is having difficulty managing her DMII due to left hand/arm pain. She did not have the procedure today to attempt to relieve pain related to her graft and is unsure why. She presented to the hospital and was told she had nothing scheduled. Per Dr. Luther Parody note for 4/19, she was scheduled for 07/15/21.  ? ?RNCM Clinical Goal(s):  ?Patient will verbalize understanding of plan for management of CHF and DMII as evidenced by patient verbalization of self monitoring activities ?take all medications exactly as prescribed and will call provider for medication related questions as evidenced by documentation in EMR    ?attend all scheduled medical appointments: Dialysis on M/W/F, 07/29/21 for Echocardiogram, 08/06/21 with PCP and rescheduling Endocrinology as evidenced by provider documentation         ? ?Interventions: ?Inter-disciplinary care team collaboration (see longitudinal plan of care) ?Evaluation of current treatment plan related to  self management and patient's adherence to plan as established by provider ?Communication sent to Dr. Donnetta Hutching requesting clarification of graft procedure scheduling ? ? ?Heart Failure Interventions:  (Status: Goal on Track (progressing): YES.)  Long Term Goal  ?Wt Readings from Last 3 Encounters:  ?07/09/21 172 lb 9.6 oz (78.3 kg)  ?05/30/21  176 lb (79.8 kg)  ?05/14/21 173 lb 3.2 oz (78.6 kg)  ? ?Basic overview and discussion of pathophysiology of Heart Failure reviewed ?Provided education on low sodium diet ?Discussed the importance of keeping all appointments with  provider ?Assessed social determinant of health barriers ? ?Diabetes:  (Status: Goal on Track (progressing): YES.) Long Term Goal  ? ?Lab Results  ?Component Value Date  ? HGBA1C 9.4 (A) 03/11/2021  ? @ ?Assessed patient's understanding of A1c goal: <8% ?Provided education to patient about basic DM disease process; ?Reviewed medications with patient and discussed importance of medication adherence;        ?Counseled on importance of regular laboratory monitoring as prescribed;        ?Discussed plans with patient for ongoing care management follow up and provided patient with direct contact information for care management team;      ?Review of patient status, including review of consultants reports, relevant laboratory and other test results, and medications completed;       ?Assessed social determinant of health barriers;        ?Attempted to reschedule missed appointment with Endocrinology-patient must have lab work completed prior to scheduling missed appointment-reminded patient ?Advised patient to have labs drawn, must be fasting, labs may be drawn at PCP office-Patient will plan for lab work Thursday ?RNCM will follow up Tuesday 07/22/21 after labs are drawn to assist with scheduling Endocrinology appointment ?Discussed ways to increase compliance for insulin regimen, patient reports her daughter can help her and knows how to administer insulin ? ?Patient Goals/Self-Care Activities: ?Take medications as prescribed   ?Attend all scheduled provider appointments ?Call provider office for new concerns or questions  ?use salt in moderation ?eat more whole grains, fruits and vegetables, lean meats and healthy fats ?dress right for the weather, hot or cold ?Complete requested lab work ? ? ?  ?  ?

## 2021-07-15 NOTE — Patient Outreach (Signed)
?Medicaid Managed Care   ?Nurse Care Manager Note ? ?07/15/2021 ?Name:  Latoya Walsh MRN:  [id number] DOB:  07/01/1960 ? ?[name] is an 61 y.o. year old female who is a primary patient of Latoya Helper, MD.  The [facility] Managed Care Coordination team was consulted for assistance with:    ?CHF ?DMII ? ?[name] was given information about Medicaid Managed Care Coordination team services today. Latoya Walsh Patient agreed to services and verbal consent obtained. ? ?Engaged with patient by telephone for follow up visit in response to provider referral for case management and/or care coordination services.  ? ?Assessments/Interventions:  Review of past medical history, allergies, medications, health status, including review of consultants reports, laboratory and other test data, was performed as part of comprehensive evaluation and provision of chronic care management services. ? ?SDOH (Social Determinants of Health) assessments and interventions performed: ? ? ?Care Plan ? ?Allergies  ?Allergen Reactions  ? Penicillins Itching and Rash  ? ? ?Medications Reviewed Today   ? ? Reviewed by Adhira Montane, RN (Registered Nurse) on 07/15/21 at 1116  Med List Status: <None>  ? ?Medication Order Taking? Sig Documenting Provider Last Dose Status Informant  ?ACCU-CHEK GUIDE test strip [id number] No Use to check glucose twice daily  ?Patient not taking: Reported on 07/10/2021  ? [name], NP Not Taking Active   ?Accu-Chek Softclix Lancets lancets [id number] No 2 (two) times daily.  ?Patient not taking: Reported on 07/10/2021  ? [provider] Not Taking Active Multiple Informants  ?amLODipine (NORVASC) 10 MG tablet [id number] No Take 10 mg by mouth daily. [provider] Taking Active   ?blood glucose meter kit and supplies [id number] No Dispense based on patient and insurance preference. Use to monitor glucose twice daily.  ?Patient not taking: Reported on 07/10/2021  ? [name], NP Not Taking Active Multiple Informants  ?calcitRIOL (ROCALTROL) 0.25 MCG capsule [id number] No Take 0.5 mcg by mouth daily. [provider] Taking Active Multiple Informants  ?         ?Med Note (Riata Ikeda A   Thu Dec 19, 2020  1:12 PM) 9/27-dose change to two tablets daily  ?Cholecalciferol (VITAMIN D3) 25 MCG (1000 UT) CAPS [id number] No Take 1 capsule by mouth daily. [provider] Taking Active   ?furosemide (LASIX) 80 MG tablet [id number] No Take 80 mg  (1 tablet) in the morning and 40 mg (half tablet) in the afternoon. Latoya Lenis, MD Taking Active Multiple Informants  ?gabapentin (NEURONTIN) 100 MG capsule [id number] No TAKE ONE CAPSULE BY MOUTH EVERY MORNING and TAKE ONE CAPSULE BY MOUTH EVERYDAY AT BEDTIME Latoya Helper, MD Taking Active   ?glipiZIDE (GLUCOTROL XL) 5 MG 24 hr tablet [id number]  TAKE ONE TABLET BY MOUTH EVERY MORNING Latoya Helper, MD  Active   ?hydrALAZINE (APRESOLINE) 50 MG tablet [id number] No TAKE ONE TABLET BY MOUTH THREE TIMES DAILY [name], MD Taking Active   ?insulin glargine (LANTUS) 100 UNIT/ML Solostar Pen [id number] No Inject 35 Units into the skin at bedtime.  ?Patient not taking: Reported on 07/10/2021  ? [name], NP Not Taking Active   ?Insulin Pen Needle (PEN NEEDLES) 31G X 5 MM MISC [id number] No Use to inject insulin once daily [name], NP 06/02/2021 Active Multiple Informants  ?isosorbide mononitrate (IMDUR) 30 MG 24 hr tablet [id number] No Take 1 tablet (30 mg total) by mouth daily. Latoya Lenis, MD Taking  Expired 05/30/21 2359 Multiple Informants  ?         ?Med Note [name], CATHERINE A   Fri May 30, 2021  2:35 PM)    ?isosorbide mononitrate (IMDUR) 30 MG 24 hr tablet [id number] No Take 1 tablet (30 mg total) by mouth daily. Latoya Helper, MD Taking Active   ?metoprolol succinate (TOPROL-XL) 50 MG 24 hr tablet [id number] No Take 1 tablet (50 mg total) by mouth daily. Take with or  immediately following a meal. Latoya Helper, MD Taking Active   ?mirtazapine (REMERON) 15 MG tablet [id number] No TAKE ONE TABLET BY MOUTH EVERY EVENING Latoya Helper, MD Taking Active   ?oxyCODONE-acetaminophen (PERCOCET) 5-325 MG tablet [id number] No Take 1 tablet by mouth every 6 (six) hours as needed for severe pain.  ?Patient not taking: Reported on 07/10/2021  ? [name], MD Not Taking Active   ?         ?Med Note (Latoya Walsh A   Thu Jul 10, 2021 10:46 AM) Ran out  ?rosuvastatin (CRESTOR) 10 MG tablet [id number] No Take 1 tablet (10 mg total) by mouth daily. Latoya Lenis, MD Taking Active Multiple Informants  ?sevelamer carbonate (RENVELA) 800 MG tablet [id number] No Take 800 mg by mouth 3 (three) times daily with meals. [provider] Taking Active Multiple Informants  ?         ?Med Note (Latoya Walsh A   Thu Dec 19, 2020  1:13 PM) This medication to be delivered today 12/19/20  ?sodium bicarbonate 650 MG tablet [id number] No Take 650 mg by mouth 3 (three) times daily. [provider] Taking Active Multiple Informants  ?Med List Note Latoya Walsh, [place] 76/[birth year]/63 3354):    ? ?  ?  ? ?  ? ? ?Patient Active Problem List  ? Diagnosis Date Noted  ? ESRD (end stage renal disease) ([place]) 01/06/2021  ? CHF (congestive heart failure) ([place]) 06/06/2020  ? Leg swelling 04/28/2020  ? Hyperlipidemia LDL goal <100 04/28/2020  ? Left hand pain 03/10/2020  ? Anemia 02/19/2020  ? Depression, major, single episode, in partial remission ([place]) 01/25/2019  ? Headache 11/13/2018  ? Insomnia 09/25/2018  ? Screening for colorectal cancer 07/23/2016  ? Lumbar back pain with radiculopathy affecting right lower extremity 03/27/2015  ? Non compliance with medical treatment 07/22/2014  ? Chronic hepatitis C without hepatic coma ([place]) 12/04/2013  ? Allergic rhinitis 11/03/2012  ? Severe hypertension 06/25/2011  ? Anxiety and depression 02/05/2011  ? Nicotine dependence 11/01/2010  ? Drug  dependence, continuous abuse ([place]) 03/22/2010  ? Uncontrolled type 2 diabetes mellitus with hyperglycemia ([place]) 06/02/2007  ? Alcohol abuse 06/02/2007  ? ? ?Conditions to be addressed/monitored per PCP order:  CHF and DMII ? ?Care Plan : RN Care Manager Plan of Care  ?Updates made by Neal Montane, RN since 07/15/2021 12:00 AM  ?  ? ?Problem: Health Management needs related to DMII and HF   ?  ? ?Long-Range Goal: Development of Plan of Care to address Health Management needs related to DMII and HF   ?Start Date: 07/10/2021  ?Expected End Date: 10/08/2021  ?Priority: High  ?Note:   ?Current Barriers:  ?Chronic Disease Management support and education needs related to CHF and DMII Ms. Postlethwait is having difficulty managing her DMII due to left hand/arm pain. She did not have the procedure today to attempt to relieve pain related to her graft and is unsure why. She presented to the hospital  and was told she had nothing scheduled. Per Dr. Luther Parody note for 4/19, she was scheduled for 07/15/21.  ? ?RNCM Clinical Goal(s):  ?Patient will verbalize understanding of plan for management of CHF and DMII as evidenced by patient verbalization of self monitoring activities ?take all medications exactly as prescribed and will call provider for medication related questions as evidenced by documentation in EMR    ?attend all scheduled medical appointments: Dialysis on M/W/F, 07/29/21 for Echocardiogram, 08/06/21 with PCP and rescheduling Endocrinology as evidenced by provider documentation         ? ?Interventions: ?Inter-disciplinary care team collaboration (see longitudinal plan of care) ?Evaluation of current treatment plan related to  self management and patient's adherence to plan as established by provider ?Communication sent to Dr. Donnetta Hutching requesting clarification of graft procedure scheduling ? ? ?Heart Failure Interventions:  (Status: Goal on Track (progressing): YES.)  Long Term Goal  ?Wt Readings from Last 3 Encounters:  ?07/09/21  172 lb 9.6 oz (78.3 kg)  ?05/30/21 176 lb (79.8 kg)  ?05/14/21 173 lb 3.2 oz (78.6 kg)  ? ?Basic overview and discussion of pathophysiology of Heart Failure reviewed ?Provided education on low sodium diet ?Discussed t

## 2021-07-16 DIAGNOSIS — N179 Acute kidney failure, unspecified: Secondary | ICD-10-CM | POA: Diagnosis not present

## 2021-07-17 DIAGNOSIS — N179 Acute kidney failure, unspecified: Secondary | ICD-10-CM | POA: Diagnosis not present

## 2021-07-18 DIAGNOSIS — N179 Acute kidney failure, unspecified: Secondary | ICD-10-CM | POA: Diagnosis not present

## 2021-07-19 DIAGNOSIS — N179 Acute kidney failure, unspecified: Secondary | ICD-10-CM | POA: Diagnosis not present

## 2021-07-20 DIAGNOSIS — N186 End stage renal disease: Secondary | ICD-10-CM | POA: Diagnosis not present

## 2021-07-20 DIAGNOSIS — E1122 Type 2 diabetes mellitus with diabetic chronic kidney disease: Secondary | ICD-10-CM | POA: Diagnosis not present

## 2021-07-20 DIAGNOSIS — N179 Acute kidney failure, unspecified: Secondary | ICD-10-CM | POA: Diagnosis not present

## 2021-07-20 DIAGNOSIS — Z992 Dependence on renal dialysis: Secondary | ICD-10-CM | POA: Diagnosis not present

## 2021-07-21 DIAGNOSIS — N179 Acute kidney failure, unspecified: Secondary | ICD-10-CM | POA: Diagnosis not present

## 2021-07-22 ENCOUNTER — Other Ambulatory Visit: Payer: Self-pay | Admitting: *Deleted

## 2021-07-22 DIAGNOSIS — N179 Acute kidney failure, unspecified: Secondary | ICD-10-CM | POA: Diagnosis not present

## 2021-07-22 NOTE — Patient Instructions (Signed)
Visit Information ? ?Latoya Walsh was given information about Medicaid Managed Care team care coordination services as a part of their [place] Medicaid benefit. Latoya Walsh verbally consented to engagement with the [facility] Managed Care team.  ? ?If you are experiencing a medical emergency, please call 911 or report to your local emergency department or urgent care.  ? ?If you have a non-emergency medical problem during routine business hours, please contact your provider's office and ask to speak with a nurse.  ? ?For questions related to your [facility], please call: [phone] or visit the homepage here: [email or web address] ? ?If you would like to schedule transportation through your [facility], please call the following number at least 2 days in advance of your appointment: [phone]. ? Rides for urgent appointments can also be made after hours by calling Member Services. ? ?Call the [place] at [phone], at any time, 24 hours a day, 7 days a week. If you are in danger or need immediate medical attention call 911. ? ?If you would like help to quit smoking, call 1-800-QUIT-NOW 954 404 3154) OR Espa?ol: 1-855-D?jelo-Ya [phone]) o para m?s informaci?n haga clic aqu? or Text READY to 200-400 to register via text ? ?Latoya Walsh, ? ? ?Please see education materials related to HF provided by MyChart link. ? ?Patient verbalizes understanding of instructions and care plan provided today and agrees to view in [place]. Active MyChart status confirmed with patient.   ? ?Telephone follow up appointment with Managed Medicaid care management team member scheduled for:07/31/21 @ 3:15pm ? ?[name] RN, BSN ?[place] ?RN Care Coordinator ? ? ?Following is a copy of your plan of care:  ?Care Plan : [place] of Care   ?Updates made by Nazaret Montane, RN since 07/22/2021 12:00 AM  ?  ? ?Problem: Health Management needs related to DMII and HF   ?  ? ?Long-Range Goal: Development of Plan of Care to address Health Management needs related to DMII and HF   ?Start Date: 07/10/2021  ?Expected End Date: 10/08/2021  ?Priority: High  ?Note:   ?Current Barriers:  ?Chronic Disease Management support and education needs related to CHF and DMII Latoya Walsh has an echocardiogram scheduled on 07/29/21 and needs transportation.  ? ?RNCM Clinical Goal(s):  ?Patient will verbalize understanding of plan for management of CHF and DMII as evidenced by patient verbalization of self monitoring activities ?take all medications exactly as prescribed and will call provider for medication related questions as evidenced by documentation in EMR    ?attend all scheduled medical appointments: Dialysis on M/W/F, 07/29/21 for Echocardiogram, 08/06/21 with PCP and rescheduling Endocrinology as evidenced by provider documentation         ? ?Interventions: ?Inter-disciplinary care team collaboration (see longitudinal plan of care) ?Evaluation of current treatment plan related to  self management and patient's adherence to plan as established by provider ?Communication sent to Dr. Donnetta Hutching requesting clarification of graft procedure scheduling ?Assisted with arranging transportation to Echocardiogram on 07/29/21, pick up time 8:20am, return pick up 11am to home. ?Advised to reschedule PCP appointment on 08/06/21, as this is on a dialysis day-Patient's daughter will reschedule ? ? ?Heart Failure Interventions:  (Status: Goal on Track (progressing): YES.)  Long Term Goal  ?Wt Readings from Last 3 Encounters:  ?07/09/21 172 lb 9.6 oz (78.3 kg)  ?05/30/21 176 lb (79.8 kg)  ?05/14/21 173 lb 3.2 oz (  78.6 kg)  ? ?Basic overview and discussion of pathophysiology of Heart Failure reviewed ?Provided education on low sodium diet ?Discussed the importance of keeping all appointments with  provider ?Assessed social determinant of health barriers ? ?Diabetes:  (Status: Goal on Track (progressing): YES.) Long Term Goal  ? ?Lab Results  ?Component Value Date  ? HGBA1C 9.4 (A) 03/11/2021  ? @ ?Assessed patient's understanding of A1c goal: <8% ?Provided education to patient about basic DM disease process; ?Reviewed medications with patient and discussed importance of medication adherence;        ?Counseled on importance of regular laboratory monitoring as prescribed;        ?Discussed plans with patient for ongoing care management follow up and provided patient with direct contact information for care management team;      ?Review of patient status, including review of consultants reports, relevant laboratory and other test results, and medications completed;       ?Assessed social determinant of health barriers;        ?Attempted to reschedule missed appointment with Endocrinology-patient must have lab work completed prior to scheduling missed appointment-reminded patient ?Advised patient to have labs drawn, must be fasting, labs may be drawn at PCP office ?RNCM will follow up after labs are drawn to assist with scheduling Endocrinology appointment ?Discussed ways to increase compliance for insulin regimen, patient reports her daughter can help her and knows how to administer insulin ? ?Patient Goals/Self-Care Activities: ?Take medications as prescribed   ?Attend all scheduled provider appointments ?Call provider office for new concerns or questions  ?use salt in moderation ?eat more whole grains, fruits and vegetables, lean meats and healthy fats ?dress right for the weather, hot or cold ?Complete requested lab work ? ? ?  ?  ?

## 2021-07-22 NOTE — Patient Outreach (Signed)
?Medicaid Managed Care   ?Nurse Care Manager Note ? ?07/22/2021 ?Name:  Latoya Walsh MRN:  [id number] DOB:  26-Feb-1961 ? ?Latoya Walsh is an 61 y.o. year old female who is a primary patient of [name], MD.  The [facility] Managed Care Coordination team was consulted for assistance with:    ?DMII ?HF ? ?Latoya Walsh was given information about Medicaid Managed Care Coordination team services today. Latoya Walsh Patient agreed to services and verbal consent obtained. ? ?Engaged with patient by telephone for follow up visit in response to provider referral for case management and/or care coordination services.  ? ?Assessments/Interventions:  Review of past medical history, allergies, medications, health status, including review of consultants reports, laboratory and other test data, was performed as part of comprehensive evaluation and provision of chronic care management services. ? ?SDOH (Social Determinants of Health) assessments and interventions performed: ? ? ?Care Plan ? ?Allergies  ?Allergen Reactions  ? Penicillins Itching and Rash  ? ? ?Medications Reviewed Today   ? ? Reviewed by Latoya Montane, RN (Registered Nurse) on 07/22/21 at 1444  Med List Status: <None>  ? ?Medication Order Taking? Sig Documenting Provider Last Dose Status Informant  ?ACCU-CHEK GUIDE test strip [id number] No Use to check glucose twice daily  ?Patient not taking: Reported on 07/10/2021  ? [name], NP Not Taking Active   ?Accu-Chek Softclix Lancets lancets [id number] No 2 (two) times daily.  ?Patient not taking: Reported on 07/10/2021  ? [provider] Not Taking Active Multiple Informants  ?amLODipine (NORVASC) 10 MG tablet [id number] Yes Take 10 mg by mouth daily. [provider] Taking Active   ?blood glucose meter kit and supplies [id number] No Dispense based on patient and insurance preference. Use to monitor glucose twice daily.  ?Patient not taking: Reported on 07/10/2021  ? [name], NP Not Taking Active Multiple Informants  ?calcitRIOL (ROCALTROL) 0.25 MCG capsule [id number] Yes Take 0.5 mcg by mouth daily. [provider] Taking Active Multiple Informants  ?         ?Med Note (Nekeshia Lenhardt A   Thu Dec 19, 2020  1:12 PM) 9/27-dose change to two tablets daily  ?Cholecalciferol (VITAMIN D3) 25 MCG (1000 UT) CAPS [id number] Yes Take 1 capsule by mouth daily. [provider] Taking Active   ?furosemide (LASIX) 80 MG tablet [id number] Yes Take 80 mg  (1 tablet) in the morning and 40 mg (half tablet) in the afternoon. [name], MD Taking Active Multiple Informants  ?gabapentin (NEURONTIN) 100 MG capsule [id number] Yes TAKE ONE CAPSULE BY MOUTH EVERY MORNING and TAKE ONE CAPSULE BY MOUTH EVERYDAY AT BEDTIME [name], MD Taking Active   ?glipiZIDE (GLUCOTROL XL) 5 MG 24 hr tablet [id number] Yes TAKE ONE TABLET BY MOUTH EVERY MORNING [name], MD Taking Active   ?hydrALAZINE (APRESOLINE) 50 MG tablet [id number] Yes TAKE ONE TABLET BY MOUTH THREE TIMES DAILY [name], MD Taking Active   ?insulin glargine (LANTUS) 100 UNIT/ML Solostar Pen [id number] No Inject 35 Units into the skin at bedtime.  ?Patient not taking: Reported on 07/10/2021  ? [name], NP Not Taking Active   ?Insulin Pen Needle (PEN NEEDLES) 31G X 5 MM MISC [id number]  Use to inject insulin once daily [name], NP  Active Multiple Informants  ?isosorbide mononitrate (IMDUR) 30 MG 24 hr tablet [id number]  Take 1 tablet (30 mg total) by mouth daily. [name], MD  Expired 05/30/21 2359 Multiple Informants  ?         ?Med Note [name], CATHERINE A   Fri May 30, 2021  2:35 PM)    ?isosorbide mononitrate (IMDUR) 30 MG 24 hr tablet [id number] Yes Take 1 tablet (30 mg total) by mouth daily. [name], MD Taking Active   ?metoprolol succinate (TOPROL-XL) 50 MG 24 hr tablet [id number] Yes Take 1 tablet (50 mg total) by mouth daily. Take with or  immediately following a meal. [name], MD Taking Active   ?mirtazapine (REMERON) 15 MG tablet [id number] Yes TAKE ONE TABLET BY MOUTH EVERY EVENING [name], MD Taking Active   ?oxyCODONE-acetaminophen (PERCOCET) 5-325 MG tablet [id number] No Take 1 tablet by mouth every 6 (six) hours as needed for severe pain.  ?Patient not taking: Reported on 07/10/2021  ? [name], MD Not Taking Active   ?         ?Med Note (Norissa Bartee A   Thu Jul 10, 2021 10:46 AM) Ran out  ?rosuvastatin (CRESTOR) 10 MG tablet [id number] Yes Take 1 tablet (10 mg total) by mouth daily. [name], MD Taking Active Multiple Informants  ?sevelamer carbonate (RENVELA) 800 MG tablet [id number] Yes Take 800 mg by mouth 3 (three) times daily with meals. [provider] Taking Active Multiple Informants  ?         ?Med Note (Elley Harp A   Thu Dec 19, 2020  1:13 PM) This medication to be delivered today 12/19/20  ?sodium bicarbonate 650 MG tablet [id number] Yes Take 650 mg by mouth 3 (three) times daily. [provider] Taking Active Multiple Informants  ?Med List Note [name], [place] 50/[birth year]/74 1287):    ? ?  ?  ? ?  ? ? ?Patient Active Problem List  ? Diagnosis Date Noted  ? ESRD (end stage renal disease) ([place]) 01/06/2021  ? CHF (congestive heart failure) ([place]) 06/06/2020  ? Leg swelling 04/28/2020  ? Hyperlipidemia LDL goal <100 04/28/2020  ? Left hand pain 03/10/2020  ? Anemia 02/19/2020  ? Depression, major, single episode, in partial remission ([place]) 01/25/2019  ? Headache 11/13/2018  ? Insomnia 09/25/2018  ? Screening for colorectal cancer 07/23/2016  ? Lumbar back pain with radiculopathy affecting right lower extremity 03/27/2015  ? Non compliance with medical treatment 07/22/2014  ? Chronic hepatitis C without hepatic coma ([place]) 12/04/2013  ? Allergic rhinitis 11/03/2012  ? Severe hypertension 06/25/2011  ? Anxiety and depression 02/05/2011  ? Nicotine dependence 11/01/2010  ? Drug  dependence, continuous abuse ([place]) 03/22/2010  ? Uncontrolled type 2 diabetes mellitus with hyperglycemia ([place]) 06/02/2007  ? Alcohol abuse 06/02/2007  ? ? ?Conditions to be addressed/monitored per PCP order:  CHF and DMII ? ?Care Plan : RN Care Manager Plan of Care  ?Updates made by Anderson Montane, RN since 07/22/2021 12:00 AM  ?  ? ?Problem: Health Management needs related to DMII and HF   ?  ? ?Long-Range Goal: Development of Plan of Care to address Health Management needs related to DMII and HF   ?Start Date: 07/10/2021  ?Expected End Date: 10/08/2021  ?Priority: High  ?Note:   ?Current Barriers:  ?Chronic Disease Management support and education needs related to CHF and DMII Ms. Navia has an echocardiogram scheduled on 07/29/21 and needs transportation.  ? ?RNCM Clinical Goal(s):  ?Patient will verbalize understanding of plan for management of CHF and DMII as evidenced by patient verbalization of self monitoring activities ?  take all medications exactly as prescribed and will call provider for medication related questions as evidenced by documentation in EMR    ?attend all scheduled medical appointments: Dialysis on M/W/F, 07/29/21 for Echocardiogram, 08/06/21 with PCP and rescheduling Endocrinology as evidenced by provider documentation         ? ?Interventions: ?Inter-disciplinary care team collaboration (see longitudinal plan of care) ?Evaluation of current treatment plan related to  self management and patient's adherence to plan as established by provider ?Communication sent to Dr. Donnetta Hutching requesting clarification of graft procedure scheduling ?Assisted with arranging transportation to Echocardiogram on 07/29/21, pick up time 8:20am, return pick up 11am to home. ?Advised to reschedule PCP appointment on 08/06/21, as this is on a dialysis day-Patient's daughter will reschedule ? ? ?Heart Failure Interventions:  (Status: Goal on Track (progressing): YES.)  Long Term Goal  ?Wt Readings from Last 3 Encounters:  ?07/09/21  172 lb 9.6 oz (78.3 kg)  ?05/30/21 176 lb (79.8 kg)  ?05/14/21 173 lb 3.2 oz (78.6 kg)  ? ?Basic overview and discussion of pathophysiology of Heart Failure reviewed ?Provided education on low sodium diet ?Discussed th

## 2021-07-23 DIAGNOSIS — N179 Acute kidney failure, unspecified: Secondary | ICD-10-CM | POA: Diagnosis not present

## 2021-07-24 ENCOUNTER — Telehealth: Payer: Self-pay

## 2021-07-24 ENCOUNTER — Other Ambulatory Visit: Payer: Self-pay

## 2021-07-24 DIAGNOSIS — N179 Acute kidney failure, unspecified: Secondary | ICD-10-CM | POA: Diagnosis not present

## 2021-07-24 NOTE — Telephone Encounter (Signed)
Patient was seen in office on 07/09/21 and was to be scheduled for a Ligation of left arm AVGG on 07/15/21 at [facility] with Dr. Donnetta Hutching but was unsuccessful reaching patient until today. Advised Dr. Luther Parody next available is 08/05/21 and offered to schedule earlier in [place]. Patient declined and agreed to 5/16. Instructions reviewed and patient verbalized understanding. Will also mail instructions letter to patient.  ?

## 2021-07-25 DIAGNOSIS — N179 Acute kidney failure, unspecified: Secondary | ICD-10-CM | POA: Diagnosis not present

## 2021-07-26 DIAGNOSIS — N179 Acute kidney failure, unspecified: Secondary | ICD-10-CM | POA: Diagnosis not present

## 2021-07-27 DIAGNOSIS — N179 Acute kidney failure, unspecified: Secondary | ICD-10-CM | POA: Diagnosis not present

## 2021-07-28 DIAGNOSIS — N179 Acute kidney failure, unspecified: Secondary | ICD-10-CM | POA: Diagnosis not present

## 2021-07-29 ENCOUNTER — Ambulatory Visit (HOSPITAL_COMMUNITY)
Admission: RE | Admit: 2021-07-29 | Discharge: 2021-07-29 | Disposition: A | Discharge status: Home / Self Care | Payer: Medicaid Other | Source: Ambulatory Visit | Attending: Cardiology | Admitting: Cardiology

## 2021-07-29 DIAGNOSIS — I5022 Chronic systolic (congestive) heart failure: Secondary | ICD-10-CM | POA: Diagnosis not present

## 2021-07-29 LAB — ECHOCARDIOGRAM LIMITED
Area-P 1/2: 3.27 cm2
S' Lateral: 2.6 cm

## 2021-07-29 NOTE — Progress Notes (Signed)
*  PRELIMINARY RESULTS* ?Echocardiogram ?Limited 2-D Echocardiogram  has been performed. ? ?Samuel Germany ?07/29/2021, 10:28 AM ?

## 2021-07-30 DIAGNOSIS — N179 Acute kidney failure, unspecified: Secondary | ICD-10-CM | POA: Diagnosis not present

## 2021-07-31 ENCOUNTER — Other Ambulatory Visit: Payer: Self-pay | Admitting: *Deleted

## 2021-07-31 DIAGNOSIS — N179 Acute kidney failure, unspecified: Secondary | ICD-10-CM | POA: Diagnosis not present

## 2021-07-31 NOTE — Patient Outreach (Addendum)
?Medicaid Managed Care   ?Nurse Care Manager Note ? ?07/31/2021 ?Name:  Latoya Walsh MRN:  [id number] DOB:  Aug 31, 1960 ? ?Latoya Walsh is an 61 y.o. year old female who is a primary patient of [name], MD.  The [facility] Managed Care Coordination team was consulted for assistance with:    ?CHF ?DMII ? ?Latoya Walsh was given information about Medicaid Managed Care Coordination team services today. [name] Patient agreed to services and verbal consent obtained. ? ?Engaged with patient by telephone for follow up visit in response to provider referral for case management and/or care coordination services.  ? ?Assessments/Interventions:  Review of past medical history, allergies, medications, health status, including review of consultants reports, laboratory and other test data, was performed as part of comprehensive evaluation and provision of chronic care management services. ? ?SDOH (Social Determinants of Health) assessments and interventions performed: ?SDOH Interventions   ? ?Flowsheet Row Most Recent Value  ?SDOH Interventions   ?Food Insecurity Interventions Other (Comment)  [Advised patient to recertify for food benefits]  ?Transportation Interventions Other (Comment)  [Provided patient with [employer] transportation [phone]  ? ?  ? ? ?Care Plan ? ?Allergies  ?Allergen Reactions  ? Penicillins Itching and Rash  ? ? ?Medications Reviewed Today   ? ? Reviewed by Leighanna Montane, RN (Registered Nurse) on 07/31/21 at [place] List Status: <None>  ? ?Medication Order Taking? Sig Documenting Provider Last Dose Status Informant  ?ACCU-CHEK GUIDE test strip [id number] No Use to check glucose twice daily  ?Patient not taking: Reported on 07/10/2021  ? [name], NP Not Taking Active   ?Accu-Chek Softclix Lancets lancets [id number] No 2 (two) times daily.  ?Patient not taking: Reported on 07/10/2021  ? [provider] Not Taking Active Multiple Informants  ?amLODipine (NORVASC) 10  MG tablet [id number] No Take 10 mg by mouth daily. [provider] Taking Active   ?blood glucose meter kit and supplies [id number] No Dispense based on patient and insurance preference. Use to monitor glucose twice daily.  ?Patient not taking: Reported on 07/10/2021  ? [name], NP Not Taking Active Multiple Informants  ?calcitRIOL (ROCALTROL) 0.25 MCG capsule [id number] No Take 0.5 mcg by mouth daily. [provider] Taking Active Multiple Informants  ?         ?Med Note (Emerson Barretto A   Thu Dec 19, 2020  1:12 PM) 9/27-dose change to two tablets daily  ?Cholecalciferol (VITAMIN D3) 25 MCG (1000 UT) CAPS [id number] No Take 1 capsule by mouth daily. [provider] Taking Active   ?furosemide (LASIX) 80 MG tablet [id number] No Take 80 mg  (1 tablet) in the morning and 40 mg (half tablet) in the afternoon. [name], MD Taking Active Multiple Informants  ?gabapentin (NEURONTIN) 100 MG capsule [id number] No TAKE ONE CAPSULE BY MOUTH EVERY MORNING and TAKE ONE CAPSULE BY MOUTH EVERYDAY AT BEDTIME [name], MD Taking Active   ?glipiZIDE (GLUCOTROL XL) 5 MG 24 hr tablet [id number] No TAKE ONE TABLET BY MOUTH EVERY MORNING [name], MD Taking Active   ?hydrALAZINE (APRESOLINE) 50 MG tablet [id number] No TAKE ONE TABLET BY MOUTH THREE TIMES DAILY [name], MD Taking Active   ?insulin glargine (LANTUS) 100 UNIT/ML Solostar Pen [id number] No Inject 35 Units into the skin at bedtime.  ?Patient not taking: Reported on 07/10/2021  ? [name], NP Not Taking Active   ?Insulin Pen Needle (PEN NEEDLES) 31G X  5 MM MISC [id number] No Use to inject insulin once daily [name], NP 06/02/2021 Active Multiple Informants  ?isosorbide mononitrate (IMDUR) 30 MG 24 hr tablet [id number] No Take 1 tablet (30 mg total) by mouth daily. [name], MD Taking Expired 05/30/21 2359 Multiple Informants  ?         ?Med Note [name], CATHERINE A   Fri May 30, 2021  2:35 PM)    ?isosorbide mononitrate (IMDUR) 30 MG 24 hr tablet [id number] No Take 1 tablet (30 mg total) by mouth daily. [name], MD Taking Active   ?metoprolol succinate (TOPROL-XL) 50 MG 24 hr tablet [id number] No Take 1 tablet (50 mg total) by mouth daily. Take with or immediately following a meal. [name], MD Taking Active   ?mirtazapine (REMERON) 15 MG tablet [id number] No TAKE ONE TABLET BY MOUTH EVERY EVENING [name], MD Taking Active   ?oxyCODONE-acetaminophen (PERCOCET) 5-325 MG tablet [id number] No Take 1 tablet by mouth every 6 (six) hours as needed for severe pain.  ?Patient not taking: Reported on 07/10/2021  ? [name], MD Not Taking Active   ?         ?Med Note (Liberti Appleton A   Thu Jul 10, 2021 10:46 AM) Ran out  ?rosuvastatin (CRESTOR) 10 MG tablet [id number] No Take 1 tablet (10 mg total) by mouth daily. [name], MD Taking Active Multiple Informants  ?sevelamer carbonate (RENVELA) 800 MG tablet [id number] No Take 800 mg by mouth 3 (three) times daily with meals. [provider] Taking Active Multiple Informants  ?         ?Med Note (Nija Koopman A   Thu Dec 19, 2020  1:13 PM) This medication to be delivered today 12/19/20  ?sodium bicarbonate 650 MG tablet [id number] No Take 650 mg by mouth 3 (three) times daily. [provider] Taking Active Multiple Informants  ?Med List Note [name], [place] 64/68/03 2122):    ? ?  ?  ? ?  ? ? ?Patient Active Problem List  ? Diagnosis Date Noted  ? ESRD (end stage renal disease) ([place]) 01/06/2021  ? CHF (congestive heart failure) ([place]) 06/06/2020  ? Leg swelling 04/28/2020  ? Hyperlipidemia LDL goal <100 04/28/2020  ? Left hand pain 03/10/2020  ? Anemia 02/19/2020  ? Depression, major, single episode, in partial remission ([place]) 01/25/2019  ? Headache 11/13/2018  ? Insomnia 09/25/2018  ? Screening for colorectal cancer 07/23/2016  ? Lumbar back pain with radiculopathy affecting right lower  extremity 03/27/2015  ? Non compliance with medical treatment 07/22/2014  ? Chronic hepatitis C without hepatic coma ([place]) 12/04/2013  ? Allergic rhinitis 11/03/2012  ? Severe hypertension 06/25/2011  ? Anxiety and depression 02/05/2011  ? Nicotine dependence 11/01/2010  ? Drug dependence, continuous abuse ([place]) 03/22/2010  ? Uncontrolled type 2 diabetes mellitus with hyperglycemia ([place]) 06/02/2007  ? Alcohol abuse 06/02/2007  ? ? ?Conditions to be addressed/monitored per PCP order:  CHF and DMII ? ?Care Plan : RN Care Manager Plan of Care  ?Updates made by Maribelle Montane, RN since 07/31/2021 12:00 AM  ?  ? ?Problem: Health Management needs related to DMII and HF   ?  ? ?Long-Range Goal: Development of Plan of Care to address Health Management needs related to DMII and HF   ?Start Date: 07/10/2021  ?Expected End Date: 10/08/2021  ?Priority: High  ?Note:   ?Current Barriers:  ?Chronic Disease Management support and education  needs related to CHF and DMII Latoya Walsh reports feeling good today. She has not rescheduled her appointment with PCP and needs an appointment on a non dialysis day. She has been scheduled with Dr. Donnetta Hutching for a ligation to help with the left arm/hand pain. Patient asking when she will hear something from [place].  ? ?RNCM Clinical Goal(s):  ?Patient will verbalize understanding of plan for management of CHF and DMII as evidenced by patient verbalization of self monitoring activities ?take all medications exactly as prescribed and will call provider for medication related questions as evidenced by documentation in EMR    ?attend all scheduled medical appointments: Dialysis on M/W/F,  08/05/21 Graft procedure, 08/06/21 with PCP and rescheduling Endocrinology as evidenced by provider documentation         ? ?Interventions: ?Inter-disciplinary care team collaboration (see longitudinal plan of care) ?Evaluation of current treatment plan related to  self management and patient's adherence to plan as  established by provider ?Collaborated with PCP for rescheduling of next appointment ?Collaborate with LCSW re: Hospice referral ? ? ?Heart Failure Interventions:  (Status: Goal on Track (progressing):

## 2021-07-31 NOTE — Patient Instructions (Addendum)
Visit Information ? ?Latoya Walsh was given information about Medicaid Managed Care team care coordination services as a part of their [place] Medicaid benefit. Latoya Walsh verbally consented to engagement with the [facility] Managed Care team.  ? ?If you are experiencing a medical emergency, please call 911 or report to your local emergency department or urgent care.  ? ?If you have a non-emergency medical problem during routine business hours, please contact your provider's office and ask to speak with a nurse.  ? ?For questions related to your [facility], please call: [phone] or visit the homepage here: [email or web address] ? ?If you would like to schedule transportation through your [facility], please call the following number at least 2 days in advance of your appointment: [phone]. ? Rides for urgent appointments can also be made after hours by calling Member Services. ? ?Call the [place] at [phone], at any time, 24 hours a day, 7 days a week. If you are in danger or need immediate medical attention call 911. ? ?If you would like help to quit smoking, call 1-800-QUIT-NOW 947-369-7192) OR Espa?ol: 1-855-D?jelo-Ya 912-522-6428) o para m?s informaci?n haga clic aqu? or Text READY to 200-400 to register via text ? ?[name], ? ? ?Please see education materials related to Diabetes and Nutrition provided by MyChart link. ? ?Patient verbalizes understanding of instructions and care plan provided today and agrees to view in [place]. Active MyChart status confirmed with patient.   ? ?Telephone follow up appointment with Managed Medicaid care management team member scheduled for:08/12/21 @ 1pm ? ?[name] RN, BSN ?[place] ?RN Care Coordinator ? ? ?Following is a copy of your plan of care:  ?Care Plan : [place] of Care  ?Updates made by Lejla Montane, RN since 07/31/2021 12:00 AM  ?  ? ?Problem: Health Management needs related to DMII and HF   ?  ? ?Long-Range Goal: Development of Plan of Care to address Health Management needs related to DMII and HF   ?Start Date: 07/10/2021  ?Expected End Date: 10/08/2021  ?Priority: High  ?Note:   ?Current Barriers:  ?Chronic Disease Management support and education needs related to CHF and DMII [name] reports feeling good today. She has not rescheduled her appointment with PCP and needs an appointment on a non dialysis day. She has been scheduled with Dr. Donnetta Hutching for a ligation to help with the left arm/hand pain. Patient asking when she will hear something from [place].  ? ?RNCM Clinical Goal(s):  ?Patient will verbalize understanding of plan for management of CHF and DMII as evidenced by patient verbalization of self monitoring activities ?take all medications exactly as prescribed and will call provider for medication related questions as evidenced by documentation in EMR    ?attend all scheduled medical appointments: Dialysis on M/W/F,  08/05/21 Graft procedure, 08/06/21 with PCP and rescheduling Endocrinology as evidenced by provider documentation         ? ?Interventions: ?Inter-disciplinary care team collaboration (see longitudinal plan of care) ?Evaluation of current treatment plan related to  self management and patient's adherence to plan as established by provider ?Collaborated with PCP for rescheduling of next appointment ?Collaborate with LCSW re: Hospice referral ? ? ?Heart Failure Interventions:  (Status: Goal on Track (progressing): YES.)  Long Term Goal  ?Wt Readings from Last 3 Encounters:  ?07/09/21 172 lb 9.6 oz (78.3 kg)  ?05/30/21  176 lb (79.8 kg)  ?05/14/21 173 lb 3.2 oz (78.6 kg)  ? ?Basic overview and discussion of pathophysiology of Heart Failure reviewed ?Provided education on low sodium diet ?Discussed the importance of keeping all  appointments with provider ?Assessed social determinant of health barriers ? ?Diabetes:  (Status: Goal on track: NO.) Long Term Goal  ? ?Lab Results  ?Component Value Date  ? HGBA1C 9.4 (A) 03/11/2021  ? @ ?Assessed patient's understanding of A1c goal: <8% ?Provided education to patient about basic DM disease process; ?Reviewed medications with patient and discussed importance of medication adherence;        ?Counseled on importance of regular laboratory monitoring as prescribed;        ?Discussed plans with patient for ongoing care management follow up and provided patient with direct contact information for care management team;      ?Review of patient status, including review of consultants reports, relevant laboratory and other test results, and medications completed;       ?Assessed social determinant of health barriers;        ?Attempted to reschedule missed appointment with Endocrinology-patient must have lab work completed prior to scheduling missed appointment-reminded patient ?Advised patient to have labs drawn, must be fasting, labs may be drawn at PCP office-RNCM will collaborate with PCP for needed labs ?RNCM will follow up after labs are drawn to assist with scheduling Endocrinology appointment ? ? ?Patient Goals/Self-Care Activities: ?Take medications as prescribed   ?Attend all scheduled provider appointments ?Call provider office for new concerns or questions  ?use salt in moderation ?eat more whole grains, fruits and vegetables, lean meats and healthy fats ?dress right for the weather, hot or cold ?Complete requested lab work ? ? ?  ?  ?

## 2021-08-01 ENCOUNTER — Encounter (HOSPITAL_COMMUNITY)
Admission: RE | Admit: 2021-08-01 | Discharge: 2021-08-01 | Disposition: A | Discharge status: Home / Self Care | Payer: Medicaid Other | Source: Ambulatory Visit | Attending: Vascular Surgery | Admitting: Vascular Surgery

## 2021-08-01 ENCOUNTER — Other Ambulatory Visit: Payer: Self-pay | Admitting: Licensed Clinical Social Worker

## 2021-08-01 NOTE — Patient Instructions (Signed)
Visit Information ? ?Ms. [place] was given information about Medicaid Managed Care team care coordination services as a part of their [place] Medicaid benefit. [name] verbally consented to engagement with the [facility] Managed Care team.  ? ?If you are experiencing a medical emergency, please call 911 or report to your local emergency department or urgent care.  ? ?If you have a non-emergency medical problem during routine business hours, please contact your provider's office and ask to speak with a nurse.  ? ?For questions related to your [facility], please call: [phone] or visit the homepage here: [email or web address] ? ?If you would like to schedule transportation through your [facility], please call the following number at least 2 days in advance of your appointment: [phone]. ? Rides for urgent appointments can also be made after hours by calling Member Services. ? ?Call the [place] at [phone], at any time, 24 hours a day, 7 days a week. If you are in danger or need immediate medical attention call 911. ? ?If you would like help to quit smoking, call 1-800-QUIT-NOW 631-686-5880) OR Espa?ol: 1-855-D?jelo-Ya ([phone] [phone]) o para m?s informaci?n haga clic aqu? or Text READY to 200-400 to register via text ? ?Eula Fried, BSW, MSW, LCSW ?Managed Medicaid LCSW ?[place] [facility] ?[email or web address] ?Phone: [phone] ? ? ?

## 2021-08-01 NOTE — Patient Outreach (Addendum)
[facility]) Care Management ? ?08/01/2021 ? ?Latoya Walsh ?Oct 31, 1960 ?[id number] ? ?[facility] LCSW received update from [facility] RNCM that patent has not received a call from Hospice for grief counseling yet. Initial referral was made on 06/26/21 by [facility] LCSW. [facility] LCSW completed care coordination call and left a message with Hospice team regarding this referral. [facility] LCSW ask that they please contact patient. [facility] LCSW also placed a new referral on 08/01/21 [facility] LCSW included information on the last referral which was placed on 06/26/21) on their website online. [place] LCSW updated [facility] RNCM as well.  ? ?[facility] LCSW received a return call from [facility] counselor and was informed that they have talked to patient twice. Patient said she had a headache and had to get off the phone the first call and the second call patient reported that she did not want bereavement counseling anymore because she was doing well. [facility] LCSW completed call to patient and provided this information. [facility] LCSW will route this encounter to [facility] RNCM with the final update. Patient is agreeable to grief counseling and admits she has been experiencing some confusion recently. Patient was educated on their available grief support groups as well. [facility] LCSW will follow up next month.  ? ?Eula Fried, BSW, MSW, LCSW ?Managed Medicaid LCSW ?[place] [facility] ?[email or web address] ?Phone: [phone] ? ? ? ? ?

## 2021-08-04 ENCOUNTER — Other Ambulatory Visit: Payer: Self-pay | Admitting: Nurse Practitioner

## 2021-08-05 ENCOUNTER — Ambulatory Visit (HOSPITAL_COMMUNITY): Payer: Medicaid Other | Admitting: Anesthesiology

## 2021-08-05 ENCOUNTER — Ambulatory Visit (HOSPITAL_BASED_OUTPATIENT_CLINIC_OR_DEPARTMENT_OTHER): Payer: Medicaid Other | Admitting: Anesthesiology

## 2021-08-05 ENCOUNTER — Other Ambulatory Visit: Payer: Self-pay

## 2021-08-05 ENCOUNTER — Encounter (HOSPITAL_COMMUNITY): Payer: Self-pay | Admitting: Vascular Surgery

## 2021-08-05 ENCOUNTER — Ambulatory Visit (HOSPITAL_COMMUNITY)
Admission: RE | Admit: 2021-08-05 | Discharge: 2021-08-05 | Disposition: A | Discharge status: Home / Self Care | Payer: Medicaid Other | Attending: Vascular Surgery | Admitting: Vascular Surgery

## 2021-08-05 ENCOUNTER — Encounter (HOSPITAL_COMMUNITY)
Admission: RE | Disposition: A | Discharge status: Home / Self Care | Payer: Self-pay | Source: Home / Self Care | Attending: Vascular Surgery

## 2021-08-05 DIAGNOSIS — E1122 Type 2 diabetes mellitus with diabetic chronic kidney disease: Secondary | ICD-10-CM

## 2021-08-05 DIAGNOSIS — I509 Heart failure, unspecified: Secondary | ICD-10-CM | POA: Diagnosis not present

## 2021-08-05 DIAGNOSIS — I130 Hypertensive heart and chronic kidney disease with heart failure and stage 1 through stage 4 chronic kidney disease, or unspecified chronic kidney disease: Secondary | ICD-10-CM | POA: Insufficient documentation

## 2021-08-05 DIAGNOSIS — Z794 Long term (current) use of insulin: Secondary | ICD-10-CM

## 2021-08-05 DIAGNOSIS — T82898A Other specified complication of vascular prosthetic devices, implants and grafts, initial encounter: Secondary | ICD-10-CM | POA: Diagnosis not present

## 2021-08-05 DIAGNOSIS — Z7984 Long term (current) use of oral hypoglycemic drugs: Secondary | ICD-10-CM | POA: Diagnosis not present

## 2021-08-05 DIAGNOSIS — N186 End stage renal disease: Secondary | ICD-10-CM | POA: Insufficient documentation

## 2021-08-05 DIAGNOSIS — Z992 Dependence on renal dialysis: Secondary | ICD-10-CM | POA: Diagnosis not present

## 2021-08-05 DIAGNOSIS — N189 Chronic kidney disease, unspecified: Secondary | ICD-10-CM | POA: Diagnosis not present

## 2021-08-05 DIAGNOSIS — I132 Hypertensive heart and chronic kidney disease with heart failure and with stage 5 chronic kidney disease, or end stage renal disease: Secondary | ICD-10-CM

## 2021-08-05 DIAGNOSIS — Y841 Kidney dialysis as the cause of abnormal reaction of the patient, or of later complication, without mention of misadventure at the time of the procedure: Secondary | ICD-10-CM | POA: Insufficient documentation

## 2021-08-05 DIAGNOSIS — F17200 Nicotine dependence, unspecified, uncomplicated: Secondary | ICD-10-CM | POA: Insufficient documentation

## 2021-08-05 DIAGNOSIS — N185 Chronic kidney disease, stage 5: Secondary | ICD-10-CM | POA: Diagnosis not present

## 2021-08-05 HISTORY — PX: LIGATION ARTERIOVENOUS GORTEX GRAFT: SHX5947

## 2021-08-05 LAB — GLUCOSE, CAPILLARY
Glucose-Capillary: 483 mg/dL — ABNORMAL HIGH (ref 70–99)
Glucose-Capillary: 520 mg/dL (ref 70–99)
Glucose-Capillary: 354 mg/dL — ABNORMAL HIGH (ref 70–99)
Glucose-Capillary: 274 mg/dL — ABNORMAL HIGH (ref 70–99)

## 2021-08-05 SURGERY — LIGATION ARTERIOVENOUS GORTEX GRAFT
Anesthesia: General | Site: Arm Lower | Laterality: Left

## 2021-08-05 MED ORDER — CHLORHEXIDINE GLUCONATE 4 % EX LIQD: 60.0000 mL | Freq: Once | CUTANEOUS | Status: DC

## 2021-08-05 MED ORDER — FENTANYL CITRATE PF 50 MCG/ML IJ SOSY: 25.0000 ug | PREFILLED_SYRINGE | INTRAMUSCULAR | Status: DC | PRN

## 2021-08-05 MED ORDER — VANCOMYCIN HCL IN DEXTROSE 1-5 GM/200ML-% IV SOLN
INTRAVENOUS | Status: AC
Start: 2021-08-05 — End: 2021-08-05
  Filled 2021-08-05: qty 200

## 2021-08-05 MED ORDER — MIDAZOLAM HCL 2 MG/2ML IJ SOLN
INTRAMUSCULAR | Status: AC
  Filled 2021-08-05: qty 2

## 2021-08-05 MED ORDER — ORAL CARE MOUTH RINSE: 15.0000 mL | Freq: Once | OROMUCOSAL | Status: AC

## 2021-08-05 MED ORDER — VANCOMYCIN HCL IN DEXTROSE 1-5 GM/200ML-% IV SOLN
1000.0000 mg | INTRAVENOUS | Status: AC
  Administered 2021-08-05: 1000 mg via INTRAVENOUS

## 2021-08-05 MED ORDER — SODIUM CHLORIDE 0.9 % IV SOLN: INTRAVENOUS | Status: DC

## 2021-08-05 MED ORDER — FENTANYL CITRATE (PF) 100 MCG/2ML IJ SOLN
INTRAMUSCULAR | Status: AC
  Filled 2021-08-05: qty 2

## 2021-08-05 MED ORDER — ONDANSETRON HCL 4 MG/2ML IJ SOLN
INTRAMUSCULAR | Status: DC | PRN
  Administered 2021-08-05: 4 mg via INTRAVENOUS

## 2021-08-05 MED ORDER — CHLORHEXIDINE GLUCONATE 0.12 % MT SOLN
15.0000 mL | Freq: Once | OROMUCOSAL | Status: AC
  Administered 2021-08-05: 15 mL via OROMUCOSAL
  Filled 2021-08-05: qty 15

## 2021-08-05 MED ORDER — LIDOCAINE-EPINEPHRINE 0.5 %-1:200000 IJ SOLN
INTRAMUSCULAR | Status: DC | PRN
  Administered 2021-08-05: 5 mL

## 2021-08-05 MED ORDER — PROPOFOL 10 MG/ML IV BOLUS
INTRAVENOUS | Status: DC | PRN
Start: 2021-08-05 — End: 2021-08-05
  Administered 2021-08-05: 100 ug/kg/min via INTRAVENOUS

## 2021-08-05 MED ORDER — FENTANYL CITRATE (PF) 100 MCG/2ML IJ SOLN
INTRAMUSCULAR | Status: DC | PRN
  Administered 2021-08-05: 50 ug via INTRAVENOUS

## 2021-08-05 MED ORDER — OXYCODONE-ACETAMINOPHEN 5-325 MG PO TABS: 1.0000 | ORAL_TABLET | Freq: Four times a day (QID) | ORAL | 0 refills | Status: DC | PRN

## 2021-08-05 MED ORDER — INSULIN ASPART 100 UNIT/ML IJ SOLN
12.0000 [IU] | Freq: Once | INTRAMUSCULAR | Status: AC
  Administered 2021-08-05: 12 [IU] via INTRAVENOUS
  Filled 2021-08-05: qty 0.12

## 2021-08-05 MED ORDER — MIDAZOLAM HCL 5 MG/5ML IJ SOLN
INTRAMUSCULAR | Status: DC | PRN
  Administered 2021-08-05: 1 mg via INTRAVENOUS

## 2021-08-05 MED ORDER — 0.9 % SODIUM CHLORIDE (POUR BTL) OPTIME
TOPICAL | Status: DC | PRN
Start: 2021-08-05 — End: 2021-08-05
  Administered 2021-08-05: 1000 mL

## 2021-08-05 MED ORDER — LIDOCAINE-EPINEPHRINE 0.5 %-1:200000 IJ SOLN
INTRAMUSCULAR | Status: AC
  Filled 2021-08-05: qty 1

## 2021-08-05 MED ORDER — ONDANSETRON HCL 4 MG/2ML IJ SOLN: 4.0000 mg | Freq: Once | INTRAMUSCULAR | Status: DC | PRN

## 2021-08-05 SURGICAL SUPPLY — 26 items
ADH SKN CLS APL DERMABOND .7 (GAUZE/BANDAGES/DRESSINGS) ×1
BAG HAMPER (MISCELLANEOUS) ×6 IMPLANT
CLIP LIGATING EXTRA MED SLVR (CLIP) ×3 IMPLANT
CLIP LIGATING EXTRA SM BLUE (MISCELLANEOUS) ×3 IMPLANT
COVER LIGHT HANDLE STERIS (MISCELLANEOUS) ×12 IMPLANT
DECANTER SPIKE VIAL GLASS SM (MISCELLANEOUS) ×3 IMPLANT
DERMABOND ADVANCED (GAUZE/BANDAGES/DRESSINGS) ×1
DERMABOND ADVANCED .7 DNX12 (GAUZE/BANDAGES/DRESSINGS) ×2 IMPLANT
ELECT REM PT RETURN 9FT ADLT (ELECTROSURGICAL) ×2
ELECTRODE REM PT RTRN 9FT ADLT (ELECTROSURGICAL) ×2 IMPLANT
GAUZE SPONGE 4X4 12PLY STRL (GAUZE/BANDAGES/DRESSINGS) ×3 IMPLANT
GLOVE BIOGEL PI IND STRL 7.0 (GLOVE) ×4 IMPLANT
GLOVE BIOGEL PI INDICATOR 7.0 (GLOVE) ×2
GLOVE SURG MICRO LTX SZ7.5 (GLOVE) ×3 IMPLANT
GOWN STRL REUS W/TWL LRG LVL3 (GOWN DISPOSABLE) ×12 IMPLANT
KIT BLADEGUARD II DBL (SET/KITS/TRAYS/PACK) ×1 IMPLANT
KIT TURNOVER KIT A (KITS) ×3 IMPLANT
MANIFOLD NEPTUNE II (INSTRUMENTS) ×5 IMPLANT
MARKER SKIN DUAL TIP RULER LAB (MISCELLANEOUS) ×1 IMPLANT
NS IRRIG 1000ML POUR BTL (IV SOLUTION) ×3 IMPLANT
PACK CV ACCESS (CUSTOM PROCEDURE TRAY) ×3 IMPLANT
PAD ARMBOARD 7.5X6 YLW CONV (MISCELLANEOUS) ×6 IMPLANT
SET BASIN LINEN APH (SET/KITS/TRAYS/PACK) ×3 IMPLANT
SUT SILK 0 TIES 10X30 (SUTURE) ×3 IMPLANT
SUT VIC AB 3-0 SH 27 (SUTURE) ×2
SUT VIC AB 3-0 SH 27X BRD (SUTURE) ×2 IMPLANT

## 2021-08-05 NOTE — Progress Notes (Signed)
Patient ID: Latoya Walsh, female   DOB: Jul 14, 1960, 61 y.o.   MRN: [id number] ?The patient presents today for ligation of her left upper arm AV fistula.  She is a very complex patient.  She is totally noncompliant.  She has not taken her insulin for as long as she can remember.  Her blood sugar today is 593. ? ?She continues to have significant steal symptoms in her left hand.  She reports an achy sensation.  She continues to have a left radial pulse and her graft is patent with an excellent thrill. ? ?Continued ischemia of her left hand.  I feel that it is an urgent case and needs to proceed despite her out-of-control blood sugar.  The patient is completely noncompliant with her medical regimen and also with hemodialysis.  I do not feel that there would be any possibility of optimizing her more than she currently is.  She will receive IV insulin and recheck of her sugar and proceed when her glucose is at a lower level. ?

## 2021-08-05 NOTE — Interval H&P Note (Signed)
History and Physical Interval Note: ? ?08/05/2021 ?10:25 AM ? ?Latoya Walsh  has presented today for surgery, with the diagnosis of Steal syndrome.  The various methods of treatment have been discussed with the patient and family. After consideration of risks, benefits and other options for treatment, the patient has consented to  Procedure(s): ?LIGATION OF LEFT ARM ARTERIOVENOUS GORTEX GRAFT (Left) as a surgical intervention.  The patient's history has been reviewed, patient examined, no change in status, stable for surgery.  I have reviewed the patient's chart and labs.  Questions were answered to the patient's satisfaction.   ? ? ?[name] ? ? ?

## 2021-08-05 NOTE — Anesthesia Preprocedure Evaluation (Signed)
Anesthesia Evaluation  ?Patient identified by MRN, date of birth, ID band ?Patient awake ? ? ? ?Reviewed: ?Allergy & Precautions, NPO status , Patient's Chart, lab work & pertinent test results, reviewed documented beta blocker date and time  ? ?Airway ?Mallampati: II ? ?TM Distance: >3 FB ?Neck ROM: Full ? ? ? Dental ? ?(+) Dental Advisory Given, Loose, Poor Dentition, Missing, Chipped,  ?  ?Pulmonary ?neg pulmonary ROS, Current Smoker,  ?  ?Pulmonary exam normal ?breath sounds clear to auscultation ? ? ? ? ? ? Cardiovascular ?hypertension, Pt. on medications and Pt. on home beta blockers ?+CHF  ?Normal cardiovascular exam ?Rhythm:Regular Rate:Normal ? ?1. Left ventricular ejection fraction, by estimation, is 55 to 60%. The left ventricle has normal function. The left ventricle has no regional  ?wall motion abnormalities. There is mild asymmetric left ventricular hypertrophy of the septal segment. Left  ?ventricular diastolic parameters are consistent with Grade I diastolic dysfunction (impaired relaxation).  ??2. Right ventricular systolic function is normal. The right ventricular size is normal. There is normal pulmonary artery systolic pressure. The  ?estimated right ventricular systolic pressure is 74.1 mmHg.  ??3. A small pericardial effusion is present. The pericardial effusion is posterior to the left ventricle and anterior to the right ventricle.  ??4. The mitral valve is grossly normal. Trivial mitral valve  ?regurgitation.  ??5. The inferior vena cava is normal in size with greater than 50% respiratory variability, suggesting right atrial pressure of 3 mmHg.  ? ?Comparison(s): Prior images reviewed side by side. LVEF has improved in  ?comparison.  ?  ?Neuro/Psych ? Headaches, PSYCHIATRIC DISORDERS Anxiety Depression  Neuromuscular disease   ? GI/Hepatic ?negative GI ROS, (+)  ?  ? substance abuse ? alcohol use, Hepatitis -, C  ?Endo/Other  ?diabetes, Well Controlled, Type  2, Oral Hypoglycemic Agents, Insulin Dependent ? Renal/GU ?Renal disease  ?negative genitourinary ?  ?Musculoskeletal ?Chronic back pain  ? Abdominal ?(+) + obese,   ?Peds ?negative pediatric ROS ?(+)  Hematology ? ?(+) Blood dyscrasia, anemia ,   ?Anesthesia Other Findings ? ? Reproductive/Obstetrics ?negative OB ROS ? ?  ? ? ? ? ? ? ? ? ? ? ? ? ? ?  ?  ? ? ? ? ? ? ? ? ?Anesthesia Physical ?Anesthesia Plan ? ?ASA: 3 ? ?Anesthesia Plan: General  ? ?Post-op Pain Management: Dilaudid IV  ? ?Induction: Intravenous ? ?PONV Risk Score and Plan: 3 and Propofol infusion and Ondansetron ? ?Airway Management Planned: Nasal Cannula and Natural Airway ? ?Additional Equipment:  ? ?Intra-op Plan:  ? ?Post-operative Plan:  ? ?Informed Consent: I have reviewed the patients History and Physical, chart, labs and discussed the procedure including the risks, benefits and alternatives for the proposed anesthesia with the patient or authorized representative who has indicated his/her understanding and acceptance.  ? ? ? ?Dental advisory given ? ?Plan Discussed with: CRNA and Surgeon ? ?Anesthesia Plan Comments:   ? ? ? ? ? ? ?Anesthesia Quick Evaluation ? ?

## 2021-08-05 NOTE — Op Note (Signed)
? ? ?  OPERATIVE REPORT ? ?DATE OF SURGERY: 08/05/2021 ? ?PATIENT: Latoya Walsh, 61 y.o. female ?MRN: [id number]  ?DOB: [birth year] ? ?PRE-OPERATIVE DIAGNOSIS: End-stage renal disease with steal syndrome left hand ? ?POST-OPERATIVE DIAGNOSIS:  Same ? ?PROCEDURE: Ligation of left upper arm AV Gore-Tex graft ? ?SURGEON:  Curt Jews, M.D. ? ?PHYSICIAN ASSISTANT: Nurse ? ?The assistant was needed for exposure and to expedite the case ? ?ANESTHESIA: Local with sedation ? ?EBL: per anesthesia record ? ?Total I/O ?In: 600 [I.V.:400; IV Piggyback:200] ?Out: 2 [Blood:2] ? ?BLOOD ADMINISTERED: none ? ?DRAINS: none ? ?SPECIMEN: none ? ?COUNTS CORRECT:  YES ? ?PATIENT DISPOSITION:  PACU - hemodynamically stable ? ?PROCEDURE DETAILS: ?The patient was taken operating placed supine position with area of the left arm prepped draped in sterile fashion.  Using local anesthesia incision was made over the left upper arm AV Gore-Tex graft near the prior antecubital anastomosis.  The graft was doubly ligated with 2-0 silk tie.  Wound was irrigated with saline.  Hemostased electrocautery.  The wound was closed with 3-0 Vicryl in the subcutaneous and subcuticular tissue.  Patient was transferred to the recovery room in stable condition ? ? ?Latoya Walsh, M.D., FACS ?08/05/2021 ?11:38 AM ? ?Note: Portions of this report may have been transcribed using voice recognition software.  Every effort has been made to ensure accuracy; however, inadvertent computerized transcription errors may still be present. ? ? ?

## 2021-08-05 NOTE — Transfer of Care (Signed)
Immediate Anesthesia Transfer of Care Note ? ?Patient: SARANNE CRISLIP ? ?Procedure(s) Performed: LIGATION OF LEFT ARM ARTERIOVENOUS GORTEX GRAFT (Left: Arm Lower) ? ?Patient Location: PACU ? ?Anesthesia Type:MAC ? ?Level of Consciousness: awake, alert , drowsy and patient cooperative ? ?Airway & Oxygen Therapy: Patient Spontanous Breathing and Patient connected to face mask oxygen ? ?Post-op Assessment: Report given to RN, Post -op Vital signs reviewed and stable and Patient moving all extremities X 4 ? ?Post vital signs: Reviewed and stable ? ?Last Vitals:  ?Vitals Value Taken Time  ?BP 119/64 08/05/21 1132  ?Temp    ?Pulse 76 08/05/21 1134  ?Resp 14 08/05/21 1134  ?SpO2 97 % 08/05/21 1134  ?Vitals shown include unvalidated device data. ? ?Last Pain:  ?Vitals:  ? 08/05/21 0915  ?TempSrc: Oral  ?PainSc: 10-Worst pain ever  ?   ? ?Patients Stated Pain Goal: 10 (08/05/21 0915) ? ?Complications: No notable events documented. ?

## 2021-08-05 NOTE — Anesthesia Postprocedure Evaluation (Signed)
Anesthesia Post Note ? ?Patient: Latoya Walsh ? ?Procedure(s) Performed: LIGATION OF LEFT ARM ARTERIOVENOUS GORTEX GRAFT (Left: Arm Lower) ? ?Patient location during evaluation: Phase II ?Anesthesia Type: General ?Level of consciousness: awake and alert and oriented ?Pain management: pain level controlled ?Vital Signs Assessment: post-procedure vital signs reviewed and stable ?Respiratory status: spontaneous breathing, nonlabored ventilation and respiratory function stable ?Cardiovascular status: blood pressure returned to baseline and stable ?Postop Assessment: no apparent nausea or vomiting ?Anesthetic complications: no ? ? ?No notable events documented. ? ? ?Last Vitals:  ?Vitals:  ? 08/05/21 1204 08/05/21 1215  ?BP:  (!) 156/96  ?Pulse: 72 76  ?Resp: 18 16  ?Temp:  36.5 ?C  ?SpO2: 99% 96%  ?  ?Last Pain:  ?Vitals:  ? 08/05/21 1215  ?TempSrc: Oral  ?PainSc: 6   ? ? ?  ?  ?  ?  ?  ?  ? ?Akon Reinoso C Ahmiya Abee ? ? ? ? ?

## 2021-08-06 ENCOUNTER — Ambulatory Visit: Payer: Medicaid Other | Admitting: Family Medicine

## 2021-08-06 ENCOUNTER — Encounter (HOSPITAL_COMMUNITY): Payer: Self-pay | Admitting: Vascular Surgery

## 2021-08-06 LAB — POCT I-STAT, CHEM 8
BUN: 25 mg/dL — ABNORMAL HIGH (ref 6–20)
Calcium, Ion: 1.14 mmol/L — ABNORMAL LOW (ref 1.15–1.40)
Chloride: 95 mmol/L — ABNORMAL LOW (ref 98–111)
Creatinine, Ser: 4.2 mg/dL — ABNORMAL HIGH (ref 0.44–1.00)
Glucose, Bld: 593 mg/dL (ref 70–99)
HCT: 36 % (ref 36.0–46.0)
Hemoglobin: 12.2 g/dL (ref 12.0–15.0)
Potassium: 3.9 mmol/L (ref 3.5–5.1)
Sodium: 135 mmol/L (ref 135–145)
TCO2: 30 mmol/L (ref 22–32)

## 2021-08-12 ENCOUNTER — Other Ambulatory Visit: Payer: Self-pay | Admitting: Family Medicine

## 2021-08-12 ENCOUNTER — Other Ambulatory Visit: Payer: Self-pay | Admitting: *Deleted

## 2021-08-12 DIAGNOSIS — E1140 Type 2 diabetes mellitus with diabetic neuropathy, unspecified: Secondary | ICD-10-CM

## 2021-08-12 NOTE — Patient Instructions (Signed)
Visit Information  Ms. [name]  - as a part of your Medicaid benefit, you are eligible for care management and care coordination services at no cost or copay. I was unable to reach you by phone today but would be happy to help you with your health related needs. Please feel free to call me @ [phone].   A member of the Managed Medicaid care management team will reach out to you again over the next 14 days.   [name] RN, BSN [place]  [employer] manager

## 2021-08-12 NOTE — Patient Outreach (Addendum)
Care Coordination  08/12/2021  [name] May 02, 1960 [id number]   Medicaid Managed Care   Unsuccessful Outreach Note  08/12/2021 Name: Latoya Walsh MRN: [id number] DOB: 1960-07-17  Referred by: [name], MD Reason for referral : High Risk Managed Medicaid (Unsuccessful RNCM follow up telephone outreach)   An unsuccessful telephone outreach was attempted today. The patient was referred to the case management team for assistance with care management and care coordination.   Follow Up Plan: The care management team will reach out to the patient again over the next 14 days.   [name] RN, BSN [place]  [employer] manager

## 2021-08-14 ENCOUNTER — Encounter: Payer: Self-pay | Admitting: Family Medicine

## 2021-08-14 ENCOUNTER — Ambulatory Visit (INDEPENDENT_AMBULATORY_CARE_PROVIDER_SITE_OTHER): Payer: Medicaid Other | Admitting: Family Medicine

## 2021-08-14 VITALS — BP 182/97 | HR 82 | Resp 16 | Ht 66.75 in | Wt 168.1 lb

## 2021-08-14 DIAGNOSIS — I10 Essential (primary) hypertension: Secondary | ICD-10-CM | POA: Diagnosis not present

## 2021-08-14 DIAGNOSIS — F5104 Psychophysiologic insomnia: Secondary | ICD-10-CM

## 2021-08-14 DIAGNOSIS — F17218 Nicotine dependence, cigarettes, with other nicotine-induced disorders: Secondary | ICD-10-CM | POA: Diagnosis not present

## 2021-08-14 DIAGNOSIS — Z91199 Patient's noncompliance with other medical treatment and regimen due to unspecified reason: Secondary | ICD-10-CM | POA: Diagnosis not present

## 2021-08-14 DIAGNOSIS — L309 Dermatitis, unspecified: Secondary | ICD-10-CM

## 2021-08-14 DIAGNOSIS — E1065 Type 1 diabetes mellitus with hyperglycemia: Secondary | ICD-10-CM | POA: Diagnosis not present

## 2021-08-14 DIAGNOSIS — E1165 Type 2 diabetes mellitus with hyperglycemia: Secondary | ICD-10-CM

## 2021-08-14 LAB — POCT GLYCOSYLATED HEMOGLOBIN (HGB A1C): HbA1c POC (<> result, manual entry): >15 % (ref 4.0–5.6)

## 2021-08-14 LAB — GLUCOSE, POCT (MANUAL RESULT ENTRY): POC Glucose: 502 mg/dl — AB (ref 70–99)

## 2021-08-14 MED ORDER — HYDROXYZINE PAMOATE 50 MG PO CAPS: ORAL_CAPSULE | ORAL | 1 refills | Status: DC

## 2021-08-14 MED ORDER — BETAMETHASONE DIPROPIONATE 0.05 % EX CREA: TOPICAL_CREAM | Freq: Two times a day (BID) | CUTANEOUS | 0 refills | Status: DC

## 2021-08-14 MED ORDER — INSULIN ASPART 100 UNIT/ML IJ SOLN
7.0000 [IU] | Freq: Once | INTRAMUSCULAR | Status: AC
  Administered 2021-08-14: 10 [IU] via SUBCUTANEOUS

## 2021-08-14 NOTE — Patient Instructions (Addendum)
F/U in 5 to 7 weeks, call if you need me sooner  You NEED nurse to assist with taking medication as prescribed and I will reach out for help with this, blood sugar high although you have multiple meds listed as taking and you have tabs today you should have taken 3 weeks ago  Cream is prescribed for rash on left forearm  Hydroxyzine capsule one at night is prescribed for sleep and itching  glycohB and blood sugar test in office , 10 units regular insulin in office  You ned to see Specialist for diabetes management and you are referred , KEEP appointment please  Thanks for choosing [facility], we consider it a privelige to serve you.

## 2021-08-14 NOTE — Progress Notes (Signed)
      Latoya Walsh     MRN: [id number]      DOB: 29-Jun-1960   HPI Latoya Walsh is here for follow up and re-evaluation of chronic medical conditions, medication management and review of any available recent lab and radiology data.  Preventive health is updated, specifically  Cancer screening and Immunization.   Uncontrolled blood pressure and blood sugar, incapable of taking medication regularly as prescribed, needs assistance with this C/o itchy rash on forearm andalo insomnia   ROS Denies recent fever or chills. Denies sinus pressure, nasal congestion, ear pain or sore throat. Denies chest congestion, productive cough or wheezing. Denies chest pains, palpitations and leg swelling Denies abdominal pain, nausea, vomiting,diarrhea or constipation.   Denies dysuria, frequency, hesitancy or incontinence. Denies joint pain, swelling and limitation in mobility. Denies headaches, seizures, numbness, or tingling.   PE  BP (!) 182/97   Pulse 82   Resp 16   Ht 5' 6.75" (1.695 m)   Wt 168 lb 1.9 oz (76.3 kg)   LMP 06/23/2010   SpO2 95%   BMI 26.53 kg/m   Patient alert and oriented   HEENT: No facial asymmetry, EOMI,     Neck supple .  Chest: Clear to auscultation bilaterally.  CVS: S1, S2 no murmurs, no S3.Regular rate.  ABD: Soft non tender.   Ext: No edema  MS: Adequate ROM spine, shoulders, hips and knees.  Skin: macula papular rash noted.on forearm, left  Psych: Good eye contact, normal affect. Memory intact not anxious or depressed appearing.  CNS: CN 2-12 intact, power,  normal throughout.no focal deficits noted.   Assessment & Plan  Dermatitis betmethasone cream twice daily for 1 week, then as needed  Insomnia Sleep hygiene reviewed and written information offered also. Prescription sent for  medication needed. Hydroxyzine prescribed  Nicotine dependence Asked:confirms currently smokes cigarettes Assess: Unwilling to set a quit date, not cutting  back Advise: needs to QUIT to reduce risk of cancer, cardio and cerebrovascular disease Assist: counseled for 5 minutes and literature provided Arrange: follow up in 2 to 4 months   Severe hypertension Uncontrolled, non compliant with meds and needs assistance with this, has medication pack with her that should have been taken 3 weeks ago  Uncontrolled type 2 diabetes mellitus with hyperglycemia ([place]) Elevated blood sugar in office , regular insulin administered. Re iterated her need to be treated by Endo, and referral re sent  Non compliance with medical treatment Incapable ofmanaging her medications and complex medical conditions specifically HTN and iDDM, will reach out to case manager as far as thius is concerned

## 2021-08-15 DIAGNOSIS — N179 Acute kidney failure, unspecified: Secondary | ICD-10-CM | POA: Diagnosis not present

## 2021-08-17 ENCOUNTER — Encounter: Payer: Self-pay | Admitting: Family Medicine

## 2021-08-17 NOTE — Assessment & Plan Note (Signed)
Sleep hygiene reviewed and written information offered also. Prescription sent for  medication needed. Hydroxyzine prescribed

## 2021-08-17 NOTE — Assessment & Plan Note (Signed)
betmethasone cream twice daily for 1 week, then as needed

## 2021-08-17 NOTE — Assessment & Plan Note (Signed)
Incapable ofmanaging her medications and complex medical conditions specifically HTN and iDDM, will reach out to case manager as far as thius is concerned

## 2021-08-17 NOTE — Assessment & Plan Note (Signed)
Uncontrolled, non compliant with meds and needs assistance with this, has medication pack with her that should have been taken 3 weeks ago

## 2021-08-17 NOTE — Assessment & Plan Note (Signed)
Asked:confirms currently smokes cigarettes Assess: Unwilling to set a quit date, not cutting back Advise: needs to QUIT to reduce risk of cancer, cardio and cerebrovascular disease Assist: counseled for 5 minutes and literature provided Arrange: follow up in 2 to 4 months

## 2021-08-17 NOTE — Assessment & Plan Note (Signed)
Elevated blood sugar in office , regular insulin administered. Re iterated her need to be treated by Endo, and referral re sent

## 2021-08-18 DIAGNOSIS — N179 Acute kidney failure, unspecified: Secondary | ICD-10-CM | POA: Diagnosis not present

## 2021-08-20 DIAGNOSIS — Z992 Dependence on renal dialysis: Secondary | ICD-10-CM | POA: Diagnosis not present

## 2021-08-20 DIAGNOSIS — N186 End stage renal disease: Secondary | ICD-10-CM | POA: Diagnosis not present

## 2021-08-20 DIAGNOSIS — E1122 Type 2 diabetes mellitus with diabetic chronic kidney disease: Secondary | ICD-10-CM | POA: Diagnosis not present

## 2021-08-21 ENCOUNTER — Other Ambulatory Visit: Payer: Self-pay | Admitting: Licensed Clinical Social Worker

## 2021-08-21 DIAGNOSIS — N179 Acute kidney failure, unspecified: Secondary | ICD-10-CM | POA: Diagnosis not present

## 2021-08-21 NOTE — Patient Outreach (Signed)
Medicaid Managed Care Social Work Note  08/21/2021 Name:  Latoya Walsh MRN:  [id number] DOB:  02-15-1961  Latoya Walsh is an 61 y.o. year old female who is a primary patient of Latoya Helper, MD.  The [facility] Managed Care Coordination team was consulted for assistance with:  Grief Counseling  [name] was given information about Medicaid Managed Care Coordination team services today. Latoya Walsh Patient agreed to services and verbal consent obtained.  Engaged with patient  for by telephone forfollow up visit in response to referral for case management and/or care coordination services.   Assessments/Interventions:  Review of past medical history, allergies, medications, health status, including review of consultants reports, laboratory and other test data, was performed as part of comprehensive evaluation and provision of chronic care management services.  SDOH: (Social Determinant of Health) assessments and interventions performed: SDOH Interventions    Flowsheet Row Most Recent Value  SDOH Interventions   Stress Interventions Provide Counseling, [employer]       Advanced Directives Status:  Not addressed in this encounter.  Care Plan                 Allergies  Allergen Reactions   Penicillins Itching and Rash    Medications Reviewed Today     Reviewed by Greg Cutter, LCSW (Social Worker) on 08/21/21 at 4  Med List Status: <None>   Medication Order Taking? Sig Documenting Provider Last Dose Status Informant  ACCU-CHEK GUIDE test strip [id number] No Use to check glucose twice daily [name], NP Taking Active   Accu-Chek Softclix Lancets lancets [id number] No 2 (two) times daily. [provider] Taking Active Multiple Informants  amLODipine (NORVASC) 10 MG tablet [id number] No Take 10 mg by mouth daily. [provider] Taking Active   betamethasone dipropionate 0.05 % cream [id number]  Apply  topically 2 (two) times daily. Latoya Helper, MD  Active   blood glucose meter kit and supplies [id number] No Dispense based on patient and insurance preference. Use to monitor glucose twice daily. [name], NP Taking Active Multiple Informants  calcitRIOL (ROCALTROL) 0.25 MCG capsule [id number] No Take 0.5 mcg by mouth daily. [provider] Taking Active Multiple Informants           Med Note (ROBB, MELANIE A   Thu Dec 19, 2020  1:12 PM) 9/27-dose change to two tablets daily  Cholecalciferol (VITAMIN D3) 25 MCG (1000 UT) CAPS [id number] No Take 1 capsule by mouth daily. [provider] Taking Active   furosemide (LASIX) 80 MG tablet [id number] No Take 80 mg  (1 tablet) in the morning and 40 mg (half tablet) in the afternoon. Latoya Lenis, MD Taking Active Multiple Informants  gabapentin (NEURONTIN) 100 MG capsule [id number] No TAKE ONE CAPSULE BY MOUTH EVERY MORNING and TAKE ONE CAPSULE BY MOUTH EVERYDAY AT BEDTIME Latoya Helper, MD Taking Active   glipiZIDE (GLUCOTROL XL) 5 MG 24 hr tablet [id number] No TAKE ONE TABLET BY MOUTH EVERY MORNING Latoya Helper, MD Taking Active   hydrALAZINE (APRESOLINE) 50 MG tablet [id number] No TAKE ONE TABLET BY MOUTH THREE TIMES DAILY [name], MD Taking Active   hydrOXYzine (VISTARIL) 50 MG capsule [id number]  Take one capsule at bedtime for sleep Latoya Helper, MD  Active   insulin glargine (LANTUS) 100 UNIT/ML Solostar Pen [id number] No Inject 35 Units into the skin at bedtime. [name], NP Taking Active   Insulin  Pen Needle (PEN NEEDLES) 31G X 5 MM MISC [id number] No Use to inject insulin once daily [name], NP Taking Active Multiple Informants  isosorbide mononitrate (IMDUR) 30 MG 24 hr tablet [id number] No Take 1 tablet (30 mg total) by mouth daily. Latoya Helper, MD Taking Active   LEVEMIR FLEXTOUCH 100 UNIT/ML FlexTouch Pen [id number] No INJECT 35 UNITS into THE SKIN AT BEDTIME  [name], MD Taking Active   metoprolol succinate (TOPROL-XL) 50 MG 24 hr tablet [id number] No Take 1 tablet (50 mg total) by mouth daily. Take with or immediately following a meal. Latoya Helper, MD Taking Active   mirtazapine (REMERON) 15 MG tablet [id number] No TAKE ONE TABLET BY MOUTH EVERY EVENING Latoya Helper, MD Taking Active   oxyCODONE-acetaminophen (PERCOCET) 5-325 MG tablet [id number] No Take 1 tablet by mouth every 6 (six) hours as needed for severe pain. [name], MD Taking Active            Med Note (ROBB, MELANIE A   Thu Jul 10, 2021 10:46 AM) Ran out  oxyCODONE-acetaminophen (PERCOCET) 5-325 MG tablet [id number] No Take 1 tablet by mouth every 6 (six) hours as needed for severe pain. [name], MD Taking Active   rosuvastatin (CRESTOR) 10 MG tablet [id number] No Take 1 tablet (10 mg total) by mouth daily. Latoya Lenis, MD Taking Active Multiple Informants  sevelamer carbonate (RENVELA) 800 MG tablet [id number] No Take 800 mg by mouth 3 (three) times daily with meals. [provider] Taking Active Multiple Informants           Med Note (ROBB, MELANIE A   Thu Dec 19, 2020  1:13 PM) This medication to be delivered today 12/19/20  sodium bicarbonate 650 MG tablet [id number] No Take 650 mg by mouth 3 (three) times daily. [provider] Taking Active Multiple Informants  Med List Note Latoya Walsh, [place] 53/97/67 3419):              Patient Active Problem List   Diagnosis Date Noted   ESRD (end stage renal disease) ([place]) 01/06/2021   CHF (congestive heart failure) ([place]) 06/06/2020   Leg swelling 04/28/2020   Hyperlipidemia LDL goal <100 04/28/2020   Left hand pain 03/10/2020   Anemia 02/19/2020   Depression, major, single episode, in partial remission ([place]) 01/25/2019   Headache 11/13/2018   Insomnia 09/25/2018   Screening for colorectal cancer 07/23/2016   Lumbar back pain with radiculopathy affecting right lower  extremity 03/27/2015   Non compliance with medical treatment 07/22/2014   Chronic hepatitis C without hepatic coma ([place]) 12/04/2013   Allergic rhinitis 11/03/2012   Severe hypertension 06/25/2011   Anxiety and depression 02/05/2011   Nicotine dependence 11/01/2010   Dermatitis 10/30/2010   Drug dependence, continuous abuse ([place]) 03/22/2010   Uncontrolled type 2 diabetes mellitus with hyperglycemia ([place]) 06/02/2007   Alcohol abuse 06/02/2007    Conditions to be addressed/monitored per PCP order:   Grief  Care Plan : General Social Work (Adult)  Updates made by Greg Cutter, LCSW since 08/21/2021 12:00 AM     Problem: I am experiencing grief symptoms   Priority: High  Onset Date: 05/15/2021  Note:   CARE PLAN ENTRY (see longitudinal plan of care for additional care plan information)    Priority: High  Timeframe:  Long-Range Goal Priority:  High Start Date:   05/15/21             Expected End  Date:  08/21/21                   Follow Up Date--None. Goal has been closed as multiple referrals have been made and grief management coping skill and resource education has been provided.   Current Barriers:  Knowledge deficits related to accessing mental health provider in patient with Depression  Patient is experiencing symptoms of  depression which seem to be exacerbated by the loss of her sister.     Patient needs Support, Education, and Care Coordination in order to meet unmet mental health needs  Mental Health Concerns   Clinical Social Work Goal(s):  Over the next 90 days, patient will work with SW and Engineer, drilling by telephone or in person to reduce or manage symptoms of depression and grief Patient will implement clinical interventions discussed today to decreases symptoms of depression and increase knowledge and/or ability of: coping skills.  Interventions:  Assessed patient's understanding, education, previous treatment and care coordination needs  Patient  continues to discuss difficulty in dealing with the recent loss of her sister evidenced by depressed mood, insomnia and crying spells Patient confirms having family support, lives with her daughter at this time but is working with her Education officer, museum to find her own place. Dialysis continues on Monday, Wednesdays and Fridays-vans provides transport Patient reports that she goes to bible study weekly and watches church sermons on Sundays. She reports that her church provides stable transportation to and from bible study during the week. Patient reports that she uses Medicaid transportation Provided basic mental health support, education and interventions  Grief response normalized, emotional support provided, grief counseling encouraged Collaborated with appropriate clinical care team members regarding patient's needs. [facility] LCSW mae referral to [facility] RNCM for chronic pain management.  Discussed  options for long term counseling based on need and insurance. Patient agreeable to ongoing mental health counseling Patient was referred to grief therapy through Hospice on 06/26/21 Reviewed mental health medications with patient prescribed by PCP and discussed compliance  Other interventions include: Motivational Interviewing employed Active listening / Reflection utilized  Emotional Support Provided  UPDATE 08/01/21- [facility] RNCM informed [facility] LCSW that patient had not been contacted by Hospice yet. [facility] LCSW contacted Hospice to inquire and left a message. [facility] LCSW received a return call from [facility] counselor and was informed that they have talked to patient twice. Patient said she had a headache and had to get off the phone the first call and the second call patient reported that she did not want bereavement counseling anymore because she was doing well. [facility] LCSW completed call to patient and provided this information. [place] LCSW  updated [facility] RNCM with final update. Patient is agreeable to grief counseling and admits she  has been experiencing some confusion recently. Patient was educated on their available grief support groups as well. [facility] LCSW will follow up next month. UPDATE 08/21/21- Patient reports that she did not complete grief therapy with [facility]. She reports that they may have left her a voice message but she has not been checking her messages. [facility] LCSW informed her that they were going to make one last outreach to her in order to schedule her initial appointment but if they were unable to reach her at this last outreach attempt then they would not be able to accept an additional referral and would prefer for patient to contact them directly if she wishes to gain services. [facility] LCSW provided patient with  their contact number which is [phone]. Patient reports that her hand hurts and she needs to get off the phone. She reports that she is not able to talk on the phone long because of the nerve pain in her hand. [facility] LCSW informed patient that [facility] LCSW will be closing her case for social work support now that grief coping skill and resource education has been provided and two referrals made for bereavement counseling. Patient expressed understanding and is agreeable. [place] LCSW will update [place] who is still involved in patient's care.   Patient Self Care Activities & Deficits:  Patient is unable to independently navigate community resource options without care coordination support Patient is able to implement clinical interventions discussed today and is motivated for treatment  Patient will select one of the agencies from the list provided and call to schedule an appointment Performs ADL's independently Performs IADL's independently Strong family or social support  10 LITTLE Things To Do When You're Feeling Too Down To Do Anything  Take a shower. Even if you plan to stay in all day long and not see a soul, take a shower. It takes the most effort to hop in to the shower but once you do,  you'll feel immediate results. It will wake you up and you'll be feeling much fresher (and cleaner too).  Brush and floss your teeth. Give your teeth a good brushing with a floss finish. It's a small task but it feels so good and you can check 'taking care of your health' off the list of things to do.  Do something small on your list. Most of [place] have some small thing we would like to get done (load of laundry, sew a button, email a friend). Doing one of these things will make you feel like you've accomplished something.  Drink water. Drinking water is easy right? It's also really beneficial for your health so keep a glass beside you all day and take sips often. It gives you energy and prevents you from boredom eating.  Do some floor exercises. The last thing you want to do is exercise but it might be just the thing you need the most. Keep it simple and do exercises that involve sitting or laying on the floor. Even the smallest of exercises release chemicals in the brain that make you feel good. Yoga stretches or core exercises are going to make you feel good with minimal effort.  Make your bed. Making your bed takes a few minutes but it's productive and you'll feel relieved when it's done. An unmade bed is a huge visual reminder that you're having an unproductive day. Do it and consider it your housework for the day.  Put on some nice clothes. Take the sweatpants off even if you don't plan to go anywhere. Put on clothes that make you feel good. Take a look in the mirror so your brain recognizes the sweatpants have been replaced with clothes that make you look great. It's an instant confidence booster.  Wash the dishes. A pile of dirty dishes in the sink is a reflection of your mood. It's possible that if you wash up the dishes, your mood will follow suit. It's worth a try.  Cook a real meal. If you have the luxury to have a "do nothing" day, you have time to make a real meal for yourself.  Make a meal that you love to eat. The process is good to get you out of the funk and the  food will ensure you have more energy for tomorrow.  Write out your thoughts by hand. When you hand write, you stimulate your brain to focus on the moment that you're in so make yourself comfortable and write whatever comes into your mind. Put those thoughts out on paper so they stop spinning around in your head. Those thoughts might be the very thing holding you down.  Please see past updates related to this goal by clicking on the "Past Updates" button in the selected goal       Follow up:  [facility] LCSW will close case at this time as goal has been met.   Eula Fried, BSW, MSW, [employer] Managed Medicaid LCSW [email or web address] Phone: [phone]

## 2021-08-21 NOTE — Patient Instructions (Signed)
Visit Information  Latoya Walsh was given information about Medicaid Managed Care team care coordination services as a part of their [place] Medicaid benefit. Latoya Walsh verbally consented to engagement with the [facility] Managed Care team.   If you are experiencing a medical emergency, please call 911 or report to your local emergency department or urgent care.   If you have a non-emergency medical problem during routine business hours, please contact your provider's office and ask to speak with a nurse.   For questions related to your [facility], please call: [phone] or visit the homepage here: [email or web address]  If you would like to schedule transportation through your [facility], please call the following number at least 2 days in advance of your appointment: [phone].  Rides for urgent appointments can also be made after hours by calling Member Services.  Call the [employer] at [phone], at any time, 24 hours a day, 7 days a week. If you are in danger or need immediate medical attention call 911.  If you would like help to quit smoking, call 1-800-QUIT-NOW 781-484-6373) OR Espaol: 1-855-Djelo-Ya (7-782-423-5361) o para ms informacin haga clic aqu or Text READY to 200-400 to register via text  Following is a copy of your plan of care:  Care Plan : General Social Work (Adult)  Updates made by Greg Cutter, LCSW since 08/21/2021 12:00 AM     Problem: I am experiencing grief symptoms   Priority: High  Onset Date: 05/15/2021  Note:   CARE PLAN ENTRY (see longitudinal plan of care for additional care plan information)    Priority: High  Timeframe:  Long-Range Goal Priority:  High Start Date:   05/15/21             Expected End Date:  08/21/21                   Follow Up Date--None. Goal has been closed as  multiple referrals have been made and grief management coping skill and resource education has been provided.   Current Barriers:  Knowledge deficits related to accessing mental health provider in patient with Depression  Patient is experiencing symptoms of  depression which seem to be exacerbated by the loss of her sister.     Patient needs Support, Education, and Care Coordination in order to meet unmet mental health needs  Mental Health Concerns   Clinical Social Work Goal(s):  Over the next 90 days, patient will work with SW and Engineer, drilling by telephone or in person to reduce or manage symptoms of depression and grief Patient will implement clinical interventions discussed today to decreases symptoms of depression and increase knowledge and/or ability of: coping skills.  Patient Self Care Activities & Deficits:  Patient is unable to independently navigate community resource options without care coordination support Patient is able to implement clinical interventions discussed today and is motivated for treatment  Patient will select one of the agencies from the list provided and call to schedule an appointment Performs ADL's independently Performs IADL's independently Strong family or social support  10 LITTLE Things To Do When You're Feeling Too Down To Do Anything  Take a shower. Even if you plan to stay in all day long and not see a soul, take a shower. It takes the most effort to hop in to the shower but once you do, you'll feel immediate results. It will wake you up  and you'll be feeling much fresher (and cleaner too).  Brush and floss your teeth. Give your teeth a good brushing with a floss finish. It's a small task but it feels so good and you can check 'taking care of your health' off the list of things to do.  Do something small on your list. Most of [place] have some small thing we would like to get done (load of laundry, sew a button, email a friend). Doing one  of these things will make you feel like you've accomplished something.  Drink water. Drinking water is easy right? It's also really beneficial for your health so keep a glass beside you all day and take sips often. It gives you energy and prevents you from boredom eating.  Do some floor exercises. The last thing you want to do is exercise but it might be just the thing you need the most. Keep it simple and do exercises that involve sitting or laying on the floor. Even the smallest of exercises release chemicals in the brain that make you feel good. Yoga stretches or core exercises are going to make you feel good with minimal effort.  Make your bed. Making your bed takes a few minutes but it's productive and you'll feel relieved when it's done. An unmade bed is a huge visual reminder that you're having an unproductive day. Do it and consider it your housework for the day.  Put on some nice clothes. Take the sweatpants off even if you don't plan to go anywhere. Put on clothes that make you feel good. Take a look in the mirror so your brain recognizes the sweatpants have been replaced with clothes that make you look great. It's an instant confidence booster.  Wash the dishes. A pile of dirty dishes in the sink is a reflection of your mood. It's possible that if you wash up the dishes, your mood will follow suit. It's worth a try.  Cook a real meal. If you have the luxury to have a "do nothing" day, you have time to make a real meal for yourself. Make a meal that you love to eat. The process is good to get you out of the funk and the food will ensure you have more energy for tomorrow.  Write out your thoughts by hand. When you hand write, you stimulate your brain to focus on the moment that you're in so make yourself comfortable and write whatever comes into your mind. Put those thoughts out on paper so they stop spinning around in your head. Those thoughts might be the very thing holding you  down.  Please see past updates related to this goal by clicking on the "Past Updates" button in the selected goal   Eula Fried, BSW, MSW, [place] Medicaid [email or web address] [email or web address] Phone: [phone]

## 2021-08-22 DIAGNOSIS — N179 Acute kidney failure, unspecified: Secondary | ICD-10-CM | POA: Diagnosis not present

## 2021-08-25 ENCOUNTER — Ambulatory Visit: Payer: Medicaid Other | Admitting: Medical

## 2021-08-25 DIAGNOSIS — N179 Acute kidney failure, unspecified: Secondary | ICD-10-CM | POA: Diagnosis not present

## 2021-08-25 NOTE — Progress Notes (Deleted)
Cardiology Office Note:    Date:  08/25/2021   ID:  Latoya Walsh, DOB 04-23-1960, MRN [id number]  PCP:  [name], MD  [facility] Cardiologist:  Carlyle Dolly, MD  [facility] Electrophysiologist:  None   Referring MD: [name], MD   Chief Complaint: 3 month follow-up  History of Present Illness:    Latoya Walsh is a 61 y.o. female with a hx of HFrEF (EF 30-35% by echo in 05/2020, at 45% by echo in 09/2020), HTN, HLD, Type 2 DM, ESRD, Hepatitis C and history of substance use who presents to the office today for 14-month follow-up.  She was first diagnosed with HFrEF during 05/2020 admission. Echo showed LVEF 30-35%. She was placed on GDMT, CKD limiting this. Follow-up echo 09/2020 showed LVEF 45%, G3DD. Cardiac cath was not previously pursued given ESRD. Now she is on HD.   Last seen 05/2021 and was overall stable from a cardiac perspective. Repeat limited echo was ordered. Echo showed normal pump funciton. NO medication changes.   Today,   Past Medical History:  Diagnosis Date   Allergic rhinitis    Anxiety    CHF (congestive heart failure) ([place])    a. EF 30-35% by echo in 05/2020 b. EF at 45% in 09/2020   Chronic back pain    Chronic hepatitis C without hepatic coma ([place])    COVID-19 virus infection 12/26/2019   Depression    Diabetes mellitus    Hepatitis C    Hypertension    Noncompliance    Poor appetite 07/17/2014   Substance abuse ([place])    HX of drug use and alcohol use    Past Surgical History:  Procedure Laterality Date   APPENDECTOMY  2004   AV FISTULA PLACEMENT Left 11/26/2020   Procedure: LEFT ARM ARTERIOVENOUS (AV) FISTULA CREATION;  Surgeon: [name], MD;  Location: [facility];  Service: Vascular;  Laterality: Left;   AV FISTULA PLACEMENT Left 06/03/2021   Procedure: INSERTION OF LEFT UPPER ARM ARTERIOVENOUS (AV) GORE-TEX GRAFT;  Surgeon: [name], MD;  Location: [facility];  Service: Vascular;  Laterality: Left;   IR  FLUORO GUIDE CV LINE RIGHT  12/25/2020   IR US GUIDE VASC ACCESS RIGHT  12/25/2020   LIGATION ARTERIOVENOUS GORTEX GRAFT Left 08/05/2021   Procedure: LIGATION OF LEFT ARM ARTERIOVENOUS GORTEX GRAFT;  Surgeon: [name], MD;  Location: [facility];  Service: Vascular;  Laterality: Left;    Current Medications: No outpatient medications have been marked as taking for the 08/25/21 encounter (Appointment) with Kathlen Mody, Marlise Fahr H, PA-C.     Allergies:   Penicillins   Social History   Socioeconomic History   Marital status: Single    Spouse name: Not on file   Number of children: 3   Years of education: Not on file   Highest education level: Not on file  Occupational History   Occupation: Disabled  Tobacco Use   Smoking status: Light Smoker    Packs/day: 0.25    Types: Cigarettes   Smokeless tobacco: Never  Vaping Use   Vaping Use: Never used  Substance and Sexual Activity   Alcohol use: Not Currently    Alcohol/week: 40.0 standard drinks    Types: 40 Cans of beer per week    Comment: 2 16oz cans of beer a day   Drug use: Not Currently    Types: Marijuana, Cocaine    Comment: Last smoked marijuana yesterday 10/18/14   Sexual  activity: Yes    Birth control/protection: Condom  Other Topics Concern   Not on file  Social History Narrative   Not on file   Social Determinants of Health   Financial Resource Strain: Not on file  Food Insecurity: Food Insecurity Present   Worried About [place] in the Last Year: Sometimes true   Ran Out of Food in the Last Year: Sometimes true  Transportation Needs: Unmet Transportation Needs   Lack of Transportation (Medical): Yes   Lack of Transportation (Non-Medical): No  Physical Activity: Inactive   Days of Exercise per Week: 0 days   Minutes of Exercise per Session: 0 min  Stress: Stress Concern Present   Feeling of Stress : Rather much  Social Connections: Moderately Integrated   Frequency of Communication with Friends and Family:  More than three times a week   Frequency of Social Gatherings with Friends and Family: Once a week   Attends Religious Services: More than 4 times per year   Active Member of [employer] or Organizations: Yes   Attends Music therapist: More than 4 times per year   Marital Status: Separated     Family History: The patient's family history includes Diabetes in her sister.  ROS:   Please see the history of present illness.     All other systems reviewed and are negative.  EKGs/Labs/Other Studies Reviewed:    The following studies were reviewed today:  Limited echo 07/29/21  1. Left ventricular ejection fraction, by estimation, is 55 to 60%. The  left ventricle has normal function. The left ventricle has no regional  wall motion abnormalities. There is mild asymmetric left ventricular  hypertrophy of the septal segment. Left  ventricular diastolic parameters are consistent with Grade I diastolic  dysfunction (impaired relaxation).   2. Right ventricular systolic function is normal. The right ventricular  size is normal. There is normal pulmonary artery systolic pressure. The  estimated right ventricular systolic pressure is 78.2 mmHg.   3. A small pericardial effusion is present. The pericardial effusion is  posterior to the left ventricle and anterior to the right ventricle.   4. The mitral valve is grossly normal. Trivial mitral valve  regurgitation.   5. The inferior vena cava is normal in size with greater than 50%  respiratory variability, suggesting right atrial pressure of 3 mmHg.   Comparison(s): Prior images reviewed side by side. LVEF has improved in  comparison.   Echo 09/2020  1. Poor acoustic windows limit study Endocardium is difficult to  visualize Consider limited echo with Definity to define wall motion, LVEF  better Compared to echo from March 2022, LVEF is mildly improved. . Left  ventricular ejection fraction, by  estimation, is 45%%. The left  ventricle has mildly decreased function.  There is mild left ventricular hypertrophy. Left ventricular diastolic  parameters are consistent with Grade III diastolic dysfunction  (restrictive). Elevated left atrial pressure.   2. Right ventricular systolic function is mildly reduced. The right  ventricular size is normal. There is mildly elevated pulmonary artery  systolic pressure.   3. Left atrial size was mildly dilated.   4. Right atrial size was mildly dilated.   5. Mild mitral valve regurgitation.   6. Tricuspid valve regurgitation is mild to moderate.   7. The aortic valve is normal in structure. Aortic valve regurgitation is  not visualized.   8. Pulmonic valve regurgitation is moderate.   9. The inferior vena cava is normal in  size with greater than 50%  respiratory variability, suggesting right atrial pressure of 3 mmHg.    05/2020 echo IMPRESSIONS     1. Left ventricular ejection fraction, by estimation, is 30 to 35%. The  left ventricle has moderately decreased function. The left ventricle  demonstrates global hypokinesis. Left ventricular diastolic parameters are  indeterminate.   2. Right ventricular systolic function is low normal. The right  ventricular size is normal. There is moderately elevated pulmonary artery  systolic pressure. The estimated right ventricular systolic pressure is  57.8 mmHg.   3. A small pericardial effusion is present. The pericardial effusion is  posterior to the left ventricle.   4. The mitral valve is grossly normal. Mild mitral valve regurgitation.   5. Tricuspid valve regurgitation is moderate.   6. The aortic valve is tricuspid. Aortic valve regurgitation is not  visualized.   7. The inferior vena cava is normal in size with <50% respiratory  variability, suggesting right atrial pressure of 8 mmHg.     EKG:  EKG is *** ordered today.  The ekg ordered today demonstrates ***  Recent Labs: 08/05/2021: BUN 25; Creatinine, Ser 4.20;  Hemoglobin 12.2; Potassium 3.9; Sodium 135  Recent Lipid Panel    Component Value Date/Time   CHOL 167 05/02/2020 1535   TRIG 277 (H) 05/02/2020 1535   HDL 34 (L) 05/02/2020 1535   CHOLHDL 4.9 (H) 05/02/2020 1535   CHOLHDL 4.8 03/28/2019 1056   VLDL 14 10/02/2016 0821   LDLCALC 87 05/02/2020 1535   LDLCALC 136 (H) 03/28/2019 1056     Risk Assessment/Calculations:   {Does this patient have ATRIAL FIBRILLATION?:[phone]}   Physical Exam:    VS:  LMP 06/23/2010     Wt Readings from Last 3 Encounters:  08/14/21 168 lb 1.9 oz (76.3 kg)  08/05/21 172 lb 9.9 oz (78.3 kg)  07/09/21 172 lb 9.6 oz (78.3 kg)     GEN: *** Well nourished, well developed in no acute distress HEENT: Normal NECK: No JVD; No carotid bruits LYMPHATICS: No lymphadenopathy CARDIAC: ***RRR, no murmurs, rubs, gallops RESPIRATORY:  Clear to auscultation without rales, wheezing or rhonchi  ABDOMEN: Soft, non-tender, non-distended MUSCULOSKELETAL:  No edema; No deformity  SKIN: Warm and dry NEUROLOGIC:  Alert and oriented x 3 PSYCHIATRIC:  Normal affect   ASSESSMENT:    No diagnosis found. PLAN:    In order of problems listed above:  HFimpEF  ESRD on HD  HLD  Disposition: Follow up {follow up:15908} with ***   Shared Decision Making/Informed Consent   {Are you ordering a CV Procedure (e.g. stress test, cath, DCCV, TEE, etc)?   Press F2        :[id number]}    Signed, Nell Schrack Ninfa Meeker, PA-C  08/25/2021 11:23 AM    [facility]

## 2021-08-26 ENCOUNTER — Encounter: Payer: Self-pay | Admitting: Medical

## 2021-08-26 DIAGNOSIS — N179 Acute kidney failure, unspecified: Secondary | ICD-10-CM | POA: Diagnosis not present

## 2021-08-27 DIAGNOSIS — N179 Acute kidney failure, unspecified: Secondary | ICD-10-CM | POA: Diagnosis not present

## 2021-08-28 DIAGNOSIS — N179 Acute kidney failure, unspecified: Secondary | ICD-10-CM | POA: Diagnosis not present

## 2021-08-29 ENCOUNTER — Other Ambulatory Visit: Payer: Self-pay | Admitting: *Deleted

## 2021-08-29 DIAGNOSIS — N179 Acute kidney failure, unspecified: Secondary | ICD-10-CM | POA: Diagnosis not present

## 2021-08-29 NOTE — Patient Outreach (Signed)
Care Coordination  08/29/2021  Latoya Walsh Nov 27, 1960 [id number]   Medicaid Managed Care   Unsuccessful Outreach Note  08/29/2021 Name: Latoya Walsh MRN: [id number] DOB: 1960-06-24  Referred by: [name], MD Reason for referral : High Risk Managed Medicaid (Unsuccessful RNCM follow up telephone outreach, 2nd attempt)   A second unsuccessful telephone outreach was attempted today. The patient was referred to the case management team for assistance with care management and care coordination.   Follow Up Plan: A HIPAA compliant phone message was left for the patient providing contact information and requesting a return call.   [name] RN, BSN [place]  [employer] manager

## 2021-08-29 NOTE — Patient Instructions (Signed)
Visit Information  [name]  - as a part of your Medicaid benefit, you are eligible for care management and care coordination services at no cost or copay. I was unable to reach you by phone today but would be happy to help you with your health related needs. Please feel free to call me @ [phone].   A member of the Managed Medicaid care management team will reach out to you again over the next 14 days.   [name] RN, BSN [place]  [employer] manager

## 2021-09-01 DIAGNOSIS — N179 Acute kidney failure, unspecified: Secondary | ICD-10-CM | POA: Diagnosis not present

## 2021-09-02 DIAGNOSIS — N179 Acute kidney failure, unspecified: Secondary | ICD-10-CM | POA: Diagnosis not present

## 2021-09-03 DIAGNOSIS — N179 Acute kidney failure, unspecified: Secondary | ICD-10-CM | POA: Diagnosis not present

## 2021-09-04 DIAGNOSIS — N179 Acute kidney failure, unspecified: Secondary | ICD-10-CM | POA: Diagnosis not present

## 2021-09-05 ENCOUNTER — Other Ambulatory Visit: Payer: Self-pay | Admitting: "Endocrinology

## 2021-09-05 ENCOUNTER — Other Ambulatory Visit: Payer: Self-pay | Admitting: Family Medicine

## 2021-09-05 ENCOUNTER — Other Ambulatory Visit: Payer: Self-pay | Admitting: Cardiology

## 2021-09-05 DIAGNOSIS — E1140 Type 2 diabetes mellitus with diabetic neuropathy, unspecified: Secondary | ICD-10-CM

## 2021-09-05 DIAGNOSIS — N179 Acute kidney failure, unspecified: Secondary | ICD-10-CM | POA: Diagnosis not present

## 2021-09-08 DIAGNOSIS — N179 Acute kidney failure, unspecified: Secondary | ICD-10-CM | POA: Diagnosis not present

## 2021-09-09 DIAGNOSIS — N179 Acute kidney failure, unspecified: Secondary | ICD-10-CM | POA: Diagnosis not present

## 2021-09-10 ENCOUNTER — Other Ambulatory Visit: Payer: Self-pay | Admitting: "Endocrinology

## 2021-09-11 DIAGNOSIS — N179 Acute kidney failure, unspecified: Secondary | ICD-10-CM | POA: Diagnosis not present

## 2021-09-13 DIAGNOSIS — N179 Acute kidney failure, unspecified: Secondary | ICD-10-CM | POA: Diagnosis not present

## 2021-09-14 DIAGNOSIS — N179 Acute kidney failure, unspecified: Secondary | ICD-10-CM | POA: Diagnosis not present

## 2021-09-15 DIAGNOSIS — N179 Acute kidney failure, unspecified: Secondary | ICD-10-CM | POA: Diagnosis not present

## 2021-09-16 DIAGNOSIS — N179 Acute kidney failure, unspecified: Secondary | ICD-10-CM | POA: Diagnosis not present

## 2021-09-18 DIAGNOSIS — N179 Acute kidney failure, unspecified: Secondary | ICD-10-CM | POA: Diagnosis not present

## 2021-09-19 ENCOUNTER — Other Ambulatory Visit: Payer: Self-pay | Admitting: *Deleted

## 2021-09-19 DIAGNOSIS — N186 End stage renal disease: Secondary | ICD-10-CM | POA: Diagnosis not present

## 2021-09-19 DIAGNOSIS — E1122 Type 2 diabetes mellitus with diabetic chronic kidney disease: Secondary | ICD-10-CM | POA: Diagnosis not present

## 2021-09-19 DIAGNOSIS — Z992 Dependence on renal dialysis: Secondary | ICD-10-CM | POA: Diagnosis not present

## 2021-09-19 NOTE — Patient Instructions (Signed)
Visit Information  [name]  - as a part of your Medicaid benefit, you are eligible for care management and care coordination services at no cost or copay. I was unable to reach you by phone today but would be happy to help you with your health related needs. Please feel free to call me @ [phone].   A member of the Managed Medicaid care management team will reach out to you again over the next 14 days.   [name] RN, BSN   [employer] manager

## 2021-09-19 NOTE — Patient Outreach (Signed)
Care Coordination  09/19/2021  Latoya Walsh 04/08/1960 [id number]   Medicaid Managed Care   Unsuccessful Outreach Note  09/19/2021 Name: Latoya Walsh MRN: [id number] DOB: 04-19-1960  Referred by: [name], MD Reason for referral : High Risk Managed Medicaid (Unsuccessful RNCM follow up telephone outreach)   A second unsuccessful telephone outreach was attempted today. The patient was referred to the case management team for assistance with care management and care coordination.   Follow Up Plan: The care management team will reach out to the patient again over the next 14 days.   [name] RN, BSN [place]  [employer] manager

## 2021-09-20 DIAGNOSIS — N179 Acute kidney failure, unspecified: Secondary | ICD-10-CM | POA: Diagnosis not present

## 2021-09-21 DIAGNOSIS — N179 Acute kidney failure, unspecified: Secondary | ICD-10-CM | POA: Diagnosis not present

## 2021-09-22 DIAGNOSIS — N179 Acute kidney failure, unspecified: Secondary | ICD-10-CM | POA: Diagnosis not present

## 2021-09-23 DIAGNOSIS — N179 Acute kidney failure, unspecified: Secondary | ICD-10-CM | POA: Diagnosis not present

## 2021-09-27 DIAGNOSIS — N179 Acute kidney failure, unspecified: Secondary | ICD-10-CM | POA: Diagnosis not present

## 2021-09-28 DIAGNOSIS — N179 Acute kidney failure, unspecified: Secondary | ICD-10-CM | POA: Diagnosis not present

## 2021-09-30 ENCOUNTER — Other Ambulatory Visit: Payer: Self-pay | Admitting: Family Medicine

## 2021-09-30 DIAGNOSIS — F5104 Psychophysiologic insomnia: Secondary | ICD-10-CM

## 2021-09-30 DIAGNOSIS — M79642 Pain in left hand: Secondary | ICD-10-CM

## 2021-10-02 ENCOUNTER — Encounter: Payer: Self-pay | Admitting: Family Medicine

## 2021-10-02 ENCOUNTER — Other Ambulatory Visit (HOSPITAL_COMMUNITY): Payer: Self-pay | Admitting: Nephrology

## 2021-10-02 ENCOUNTER — Ambulatory Visit: Payer: Medicaid Other | Admitting: Family Medicine

## 2021-10-02 DIAGNOSIS — N186 End stage renal disease: Secondary | ICD-10-CM

## 2021-10-04 DIAGNOSIS — N179 Acute kidney failure, unspecified: Secondary | ICD-10-CM | POA: Diagnosis not present

## 2021-10-05 DIAGNOSIS — N179 Acute kidney failure, unspecified: Secondary | ICD-10-CM | POA: Diagnosis not present

## 2021-10-06 ENCOUNTER — Other Ambulatory Visit: Payer: Self-pay | Admitting: Family Medicine

## 2021-10-06 ENCOUNTER — Other Ambulatory Visit: Payer: Self-pay | Admitting: Cardiology

## 2021-10-06 DIAGNOSIS — E1140 Type 2 diabetes mellitus with diabetic neuropathy, unspecified: Secondary | ICD-10-CM

## 2021-10-07 ENCOUNTER — Ambulatory Visit (HOSPITAL_COMMUNITY)
Admission: RE | Admit: 2021-10-07 | Discharge: 2021-10-07 | Disposition: A | Discharge status: Home / Self Care | Payer: Medicaid Other | Source: Ambulatory Visit | Attending: Nephrology | Admitting: Nephrology

## 2021-10-07 ENCOUNTER — Encounter (HOSPITAL_COMMUNITY): Payer: Self-pay

## 2021-10-08 ENCOUNTER — Other Ambulatory Visit: Payer: Self-pay | Admitting: "Endocrinology

## 2021-10-08 ENCOUNTER — Other Ambulatory Visit: Payer: Self-pay | Admitting: Family Medicine

## 2021-10-08 DIAGNOSIS — E1140 Type 2 diabetes mellitus with diabetic neuropathy, unspecified: Secondary | ICD-10-CM

## 2021-10-09 ENCOUNTER — Ambulatory Visit: Payer: Medicaid Other

## 2021-10-11 DIAGNOSIS — N179 Acute kidney failure, unspecified: Secondary | ICD-10-CM | POA: Diagnosis not present

## 2021-10-12 DIAGNOSIS — N179 Acute kidney failure, unspecified: Secondary | ICD-10-CM | POA: Diagnosis not present

## 2021-10-18 DIAGNOSIS — N179 Acute kidney failure, unspecified: Secondary | ICD-10-CM | POA: Diagnosis not present

## 2021-10-19 DIAGNOSIS — N179 Acute kidney failure, unspecified: Secondary | ICD-10-CM | POA: Diagnosis not present

## 2021-10-20 DIAGNOSIS — N186 End stage renal disease: Secondary | ICD-10-CM | POA: Diagnosis not present

## 2021-10-20 DIAGNOSIS — Z992 Dependence on renal dialysis: Secondary | ICD-10-CM | POA: Diagnosis not present

## 2021-10-20 DIAGNOSIS — E1122 Type 2 diabetes mellitus with diabetic chronic kidney disease: Secondary | ICD-10-CM | POA: Diagnosis not present

## 2021-10-25 DIAGNOSIS — N179 Acute kidney failure, unspecified: Secondary | ICD-10-CM | POA: Diagnosis not present

## 2021-10-26 DIAGNOSIS — N179 Acute kidney failure, unspecified: Secondary | ICD-10-CM | POA: Diagnosis not present

## 2021-10-27 IMAGING — US IR FLUORO GUIDE CV LINE*R*
1 series · 3 of 3 positions shown · non-contrast
Comparison: none

INDICATION: And stage renal disease requiring dialysis access

[Series 1: ir fluoro guide cv line*right* · 3 of 3 slices shown]
[im 1/3]
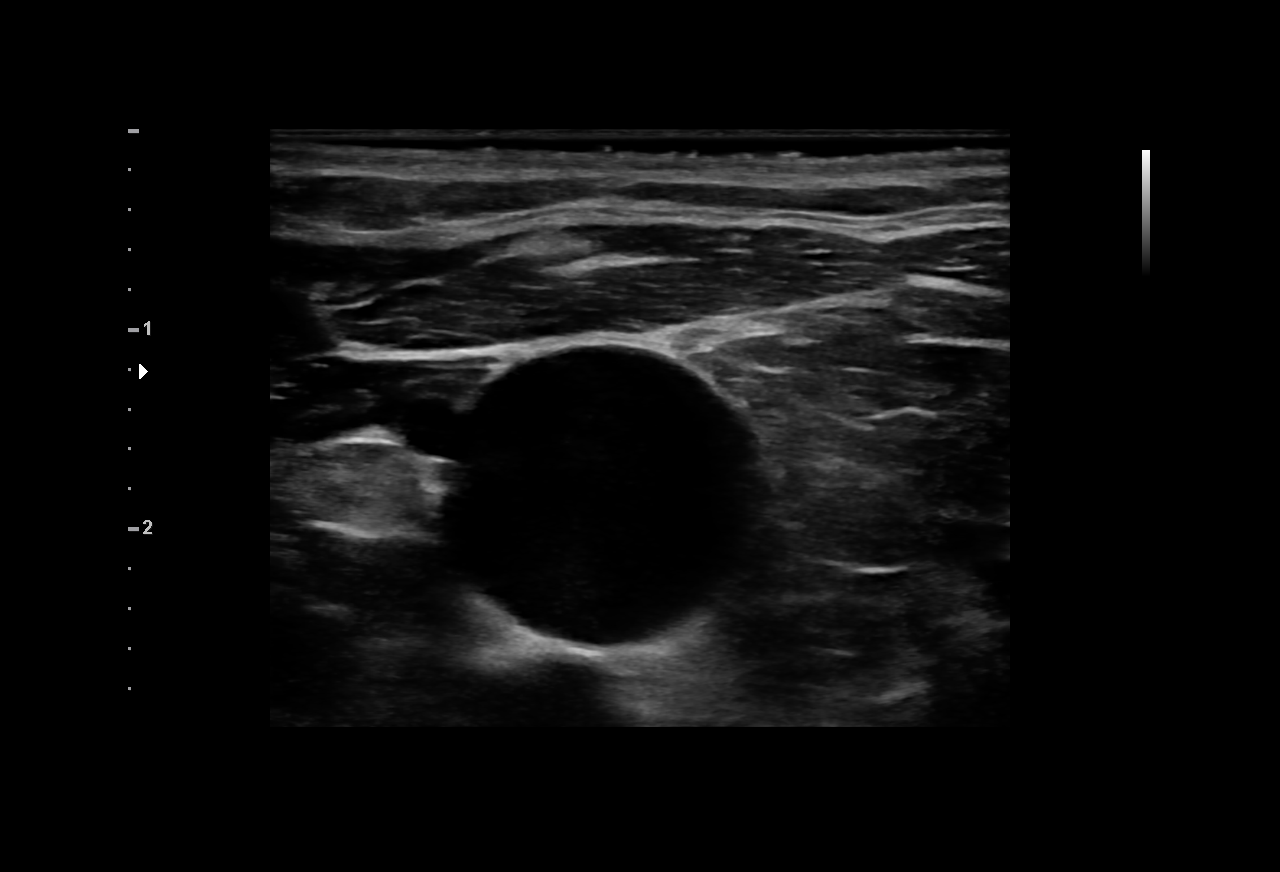
[im 2/3]
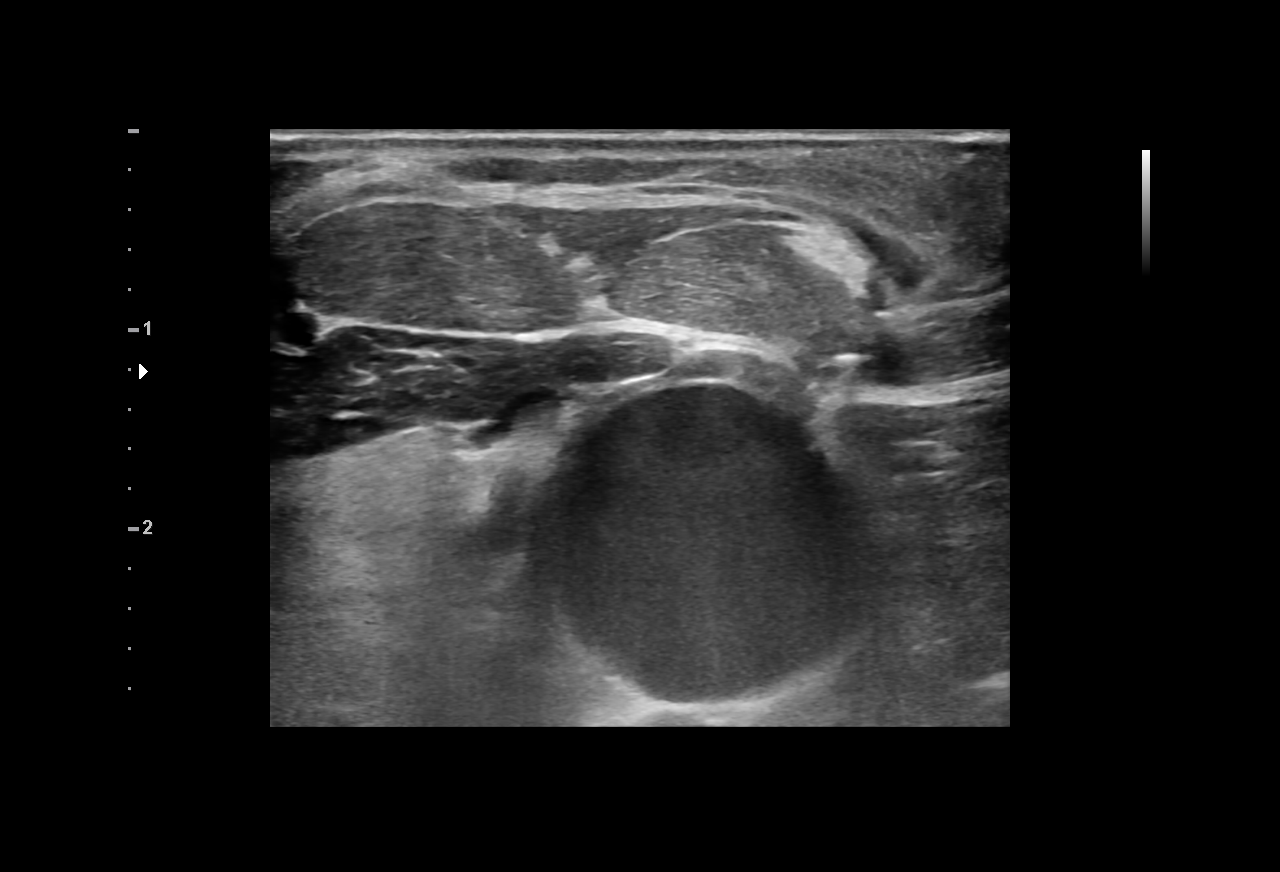
[im 3/3]
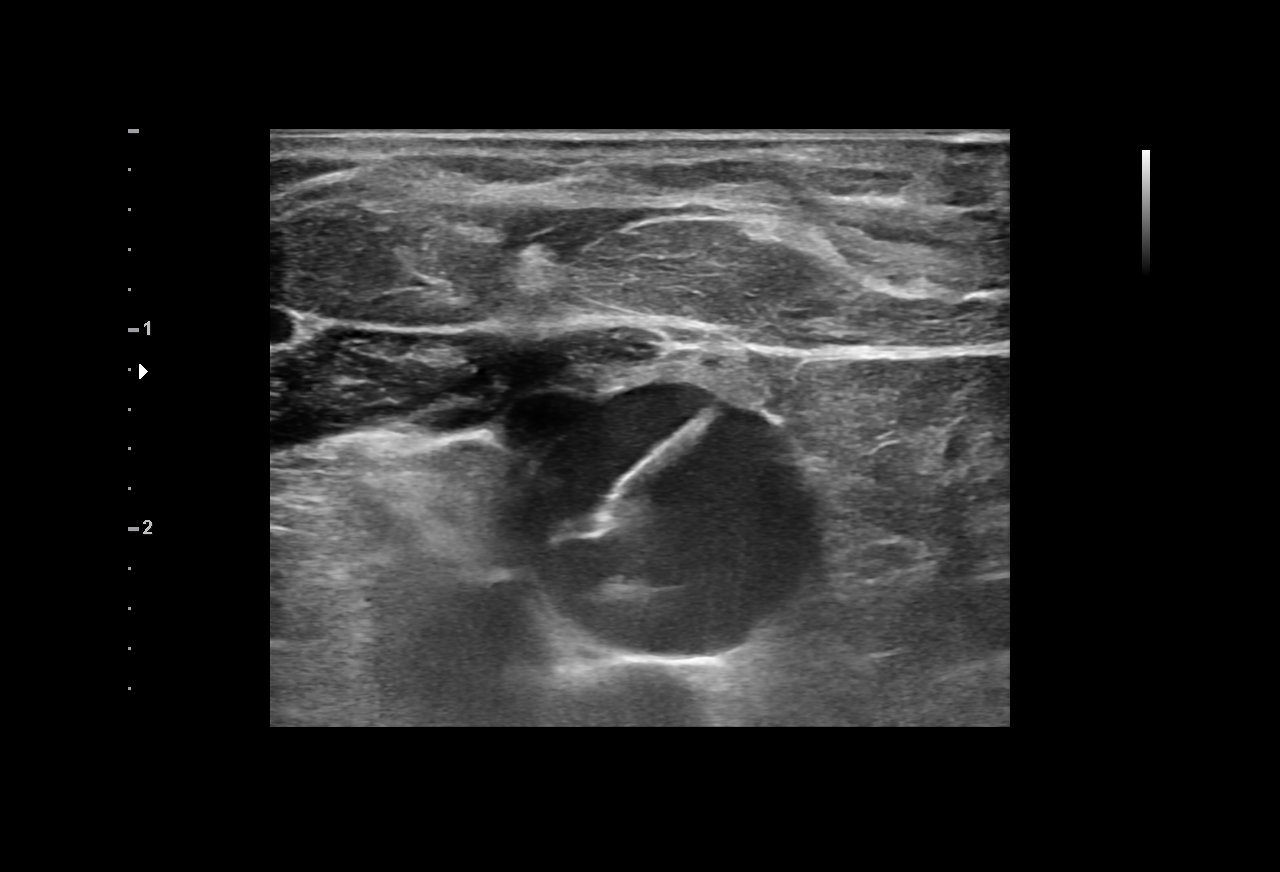

[3 of 3 positions shown; findings below may reference images not displayed]

EXAM:
Ultrasound and fluoroscopy guided tunneled right IJ hemodialysis
catheter placement

MEDICATIONS:
Vancomycin 1 gm IV; The antibiotic was administered within an
appropriate time interval prior to skin puncture.

ANESTHESIA/SEDATION:
Moderate (conscious) sedation was employed during this procedure. A
total of Versed 25 mg and Fentanyl 1 mcg was administered
intravenously.

Moderate Sedation Time: 11 minutes. The patient's level of
consciousness and vital signs were monitored continuously by
radiology nursing throughout the procedure under my direct
supervision.

FLUOROSCOPY TIME:  Fluoroscopy Time: 0.8 minutes (3.4 mGy).

COMPLICATIONS:
None immediate.

PROCEDURE:
Informed written consent was obtained from the patient after a
thorough discussion of the procedural risks, benefits and
alternatives. All questions were addressed. Maximal Sterile Barrier
Technique was utilized including caps, mask, sterile gowns, sterile
gloves, sterile drape, hand hygiene and skin antiseptic. A timeout
was performed prior to the initiation of the procedure.

The right internal jugular vein was evaluated with ultrasound and
shown to be patent. A permanent ultrasound image was obtained and
placed in the patient's medical record. Using sterile gel and a
sterile probe cover, the right internal jugular vein was entered
with a 21 ga needle during real time ultrasound guidance.

0.018 inch guidewire placed and 21 ga needle exchanged for
transitional dilator set. Utilizing fluoroscopy, 0.035 inch
guidewire advanced through the needle without difficulty.

Seriel dilation was performed and peel-away sheath was placed.
Attention then turned to the right anterior upper chest. Following
local lidocaine administration, the hemodialysis catheter was
tunneled from the chest wall to the venotomy site. The catheter was
inserted through the peel-away sheath. The tip of the catheter was
positioned within the right atrium using fluoroscopic guidance.

All lumens of the catheter aspirated and flushed well. The dialysis
lumens were locked with Heparin.

The catheter was secured to the skin with suture. The insertion site
was covered with sterile dressing.
IMPRESSION: 19 cm tunneled right IJ hemodialysis catheter is ready for use.

## 2021-10-28 ENCOUNTER — Other Ambulatory Visit: Payer: Self-pay | Admitting: Internal Medicine

## 2021-10-28 DIAGNOSIS — I10 Essential (primary) hypertension: Secondary | ICD-10-CM

## 2021-10-29 ENCOUNTER — Encounter: Payer: Self-pay | Admitting: Vascular Surgery

## 2021-10-29 ENCOUNTER — Ambulatory Visit (INDEPENDENT_AMBULATORY_CARE_PROVIDER_SITE_OTHER): Payer: Medicaid Other | Admitting: Vascular Surgery

## 2021-10-29 VITALS — BP 128/96 | HR 92 | Temp 97.8°F | Resp 14 | Ht 66.75 in | Wt 144.6 lb

## 2021-10-29 DIAGNOSIS — N186 End stage renal disease: Secondary | ICD-10-CM

## 2021-10-29 DIAGNOSIS — Z992 Dependence on renal dialysis: Secondary | ICD-10-CM
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
## 2021-11-01 DIAGNOSIS — N179 Acute kidney failure, unspecified: Secondary | ICD-10-CM | POA: Diagnosis not present

## 2021-11-02 DIAGNOSIS — N179 Acute kidney failure, unspecified: Secondary | ICD-10-CM | POA: Diagnosis not present

## 2021-11-03 ENCOUNTER — Emergency Department (HOSPITAL_COMMUNITY): Payer: Medicaid Other

## 2021-11-03 ENCOUNTER — Encounter (HOSPITAL_COMMUNITY): Payer: Self-pay

## 2021-11-03 ENCOUNTER — Inpatient Hospital Stay (HOSPITAL_COMMUNITY)
Admission: EM | Admit: 2021-11-03 | Discharge: 2021-11-05 | DRG: 177 | Disposition: A | Discharge status: Home / Self Care | Payer: Medicaid Other | Attending: Internal Medicine | Admitting: Internal Medicine

## 2021-11-03 ENCOUNTER — Other Ambulatory Visit: Payer: Self-pay

## 2021-11-03 DIAGNOSIS — F32A Depression, unspecified: Secondary | ICD-10-CM | POA: Diagnosis present

## 2021-11-03 DIAGNOSIS — Z794 Long term (current) use of insulin: Secondary | ICD-10-CM | POA: Diagnosis not present

## 2021-11-03 DIAGNOSIS — R059 Cough, unspecified: Secondary | ICD-10-CM | POA: Diagnosis present

## 2021-11-03 DIAGNOSIS — I5032 Chronic diastolic (congestive) heart failure: Secondary | ICD-10-CM | POA: Diagnosis not present

## 2021-11-03 DIAGNOSIS — I509 Heart failure, unspecified: Secondary | ICD-10-CM

## 2021-11-03 DIAGNOSIS — M898X9 Other specified disorders of bone, unspecified site: Secondary | ICD-10-CM | POA: Diagnosis present

## 2021-11-03 DIAGNOSIS — R410 Disorientation, unspecified: Secondary | ICD-10-CM | POA: Diagnosis not present

## 2021-11-03 DIAGNOSIS — M549 Dorsalgia, unspecified: Secondary | ICD-10-CM | POA: Diagnosis present

## 2021-11-03 DIAGNOSIS — B182 Chronic viral hepatitis C: Secondary | ICD-10-CM | POA: Diagnosis present

## 2021-11-03 DIAGNOSIS — U071 COVID-19: Principal | ICD-10-CM

## 2021-11-03 DIAGNOSIS — Z992 Dependence on renal dialysis: Secondary | ICD-10-CM | POA: Diagnosis not present

## 2021-11-03 DIAGNOSIS — F419 Anxiety disorder, unspecified: Secondary | ICD-10-CM | POA: Diagnosis present

## 2021-11-03 DIAGNOSIS — E1122 Type 2 diabetes mellitus with diabetic chronic kidney disease: Secondary | ICD-10-CM | POA: Diagnosis present

## 2021-11-03 DIAGNOSIS — G8929 Other chronic pain: Secondary | ICD-10-CM | POA: Diagnosis present

## 2021-11-03 DIAGNOSIS — Z833 Family history of diabetes mellitus: Secondary | ICD-10-CM

## 2021-11-03 DIAGNOSIS — E1165 Type 2 diabetes mellitus with hyperglycemia: Secondary | ICD-10-CM | POA: Diagnosis present

## 2021-11-03 DIAGNOSIS — I132 Hypertensive heart and chronic kidney disease with heart failure and with stage 5 chronic kidney disease, or end stage renal disease: Secondary | ICD-10-CM | POA: Diagnosis not present

## 2021-11-03 DIAGNOSIS — Z79899 Other long term (current) drug therapy: Secondary | ICD-10-CM | POA: Diagnosis not present

## 2021-11-03 DIAGNOSIS — D631 Anemia in chronic kidney disease: Secondary | ICD-10-CM | POA: Diagnosis present

## 2021-11-03 DIAGNOSIS — F17210 Nicotine dependence, cigarettes, uncomplicated: Secondary | ICD-10-CM | POA: Diagnosis present

## 2021-11-03 DIAGNOSIS — I10 Essential (primary) hypertension: Secondary | ICD-10-CM | POA: Diagnosis not present

## 2021-11-03 DIAGNOSIS — N186 End stage renal disease: Secondary | ICD-10-CM | POA: Diagnosis present

## 2021-11-03 DIAGNOSIS — R509 Fever, unspecified: Principal | ICD-10-CM

## 2021-11-03 DIAGNOSIS — F919 Conduct disorder, unspecified: Secondary | ICD-10-CM | POA: Diagnosis present

## 2021-11-03 DIAGNOSIS — R739 Hyperglycemia, unspecified: Secondary | ICD-10-CM | POA: Diagnosis not present

## 2021-11-03 DIAGNOSIS — Z0389 Encounter for observation for other suspected diseases and conditions ruled out: Secondary | ICD-10-CM | POA: Diagnosis not present

## 2021-11-03 DIAGNOSIS — G9341 Metabolic encephalopathy: Secondary | ICD-10-CM | POA: Diagnosis present

## 2021-11-03 DIAGNOSIS — E872 Acidosis, unspecified: Secondary | ICD-10-CM | POA: Diagnosis present

## 2021-11-03 DIAGNOSIS — N250 Renal osteodystrophy: Secondary | ICD-10-CM | POA: Diagnosis not present

## 2021-11-03 DIAGNOSIS — A419 Sepsis, unspecified organism: Secondary | ICD-10-CM | POA: Insufficient documentation

## 2021-11-03 DIAGNOSIS — E1110 Type 2 diabetes mellitus with ketoacidosis without coma: Secondary | ICD-10-CM | POA: Diagnosis not present

## 2021-11-03 DIAGNOSIS — R000 Tachycardia, unspecified: Secondary | ICD-10-CM | POA: Diagnosis not present

## 2021-11-03 DIAGNOSIS — E785 Hyperlipidemia, unspecified: Secondary | ICD-10-CM | POA: Diagnosis present

## 2021-11-03 LAB — RESP PANEL BY RT-PCR (FLU A&B, COVID) ARPGX2
Influenza A by PCR: NEGATIVE
Influenza B by PCR: NEGATIVE
SARS Coronavirus 2 by RT PCR: POSITIVE — AB

## 2021-11-03 LAB — CBC WITH DIFFERENTIAL/PLATELET
Abs Immature Granulocytes: 0.06 10*3/uL (ref 0.00–0.07)
Basophils Absolute: 0.1 10*3/uL (ref 0.0–0.1)
Basophils Relative: 1 %
Eosinophils Absolute: 0.1 10*3/uL (ref 0.0–0.5)
Eosinophils Relative: 1 %
HCT: 31.7 % — ABNORMAL LOW (ref 36.0–46.0)
Hemoglobin: 8.7 g/dL — ABNORMAL LOW (ref 12.0–15.0)
Immature Granulocytes: 1 %
Lymphocytes Relative: 8 %
Lymphs Abs: 0.8 10*3/uL (ref 0.7–4.0)
MCH: 20.1 pg — ABNORMAL LOW (ref 26.0–34.0)
MCHC: 27.4 g/dL — ABNORMAL LOW (ref 30.0–36.0)
MCV: 73.4 fL — ABNORMAL LOW (ref 80.0–100.0)
Monocytes Absolute: 0.7 10*3/uL (ref 0.1–1.0)
Monocytes Relative: 7 %
Neutro Abs: 7.9 10*3/uL — ABNORMAL HIGH (ref 1.7–7.7)
Neutrophils Relative %: 82 %
Platelets: 215 10*3/uL (ref 150–400)
RBC: 4.32 MIL/uL (ref 3.87–5.11)
RDW: 17.6 % — ABNORMAL HIGH (ref 11.5–15.5)
WBC: 9.6 10*3/uL (ref 4.0–10.5)
nRBC: 0.4 % — ABNORMAL HIGH (ref 0.0–0.2)

## 2021-11-03 LAB — HIV ANTIBODY (ROUTINE TESTING W REFLEX): HIV Screen 4th Generation wRfx: NONREACTIVE

## 2021-11-03 LAB — URINALYSIS, ROUTINE W REFLEX MICROSCOPIC
Bacteria, UA: NONE SEEN
Bilirubin Urine: NEGATIVE
Glucose, UA: >=500 mg/dL — AB
Hgb urine dipstick: NEGATIVE
Ketones, ur: NEGATIVE mg/dL
Leukocytes,Ua: NEGATIVE
Nitrite: NEGATIVE
Protein, ur: >=300 mg/dL — AB
Specific Gravity, Urine: 1.012 (ref 1.005–1.030)
pH: 7 (ref 5.0–8.0)

## 2021-11-03 LAB — COMPREHENSIVE METABOLIC PANEL
ALT: 14 U/L (ref 0–44)
AST: 14 U/L — ABNORMAL LOW (ref 15–41)
Albumin: 3.3 g/dL — ABNORMAL LOW (ref 3.5–5.0)
Alkaline Phosphatase: 83 U/L (ref 38–126)
Anion gap: 11 (ref 5–15)
BUN: 37 mg/dL — ABNORMAL HIGH (ref 6–20)
CO2: 23 mmol/L (ref 22–32)
Calcium: 9 mg/dL (ref 8.9–10.3)
Chloride: 101 mmol/L (ref 98–111)
Creatinine, Ser: 6.54 mg/dL — ABNORMAL HIGH (ref 0.44–1.00)
GFR, Estimated: 7 mL/min — ABNORMAL LOW (ref >60)
Glucose, Bld: 372 mg/dL — ABNORMAL HIGH (ref 70–99)
Potassium: 4.2 mmol/L (ref 3.5–5.1)
Sodium: 135 mmol/L (ref 135–145)
Total Bilirubin: 0.6 mg/dL (ref 0.3–1.2)
Total Protein: 7.3 g/dL (ref 6.5–8.1)

## 2021-11-03 LAB — PROCALCITONIN: Procalcitonin: 0.68 ng/mL

## 2021-11-03 LAB — LACTIC ACID, PLASMA
Lactic Acid, Venous: 2.2 mmol/L (ref 0.5–1.9)
Lactic Acid, Venous: 1.5 mmol/L (ref 0.5–1.9)

## 2021-11-03 LAB — APTT: aPTT: 23 seconds — ABNORMAL LOW (ref 24–36)

## 2021-11-03 LAB — PROTIME-INR
INR: 1.1 (ref 0.8–1.2)
Prothrombin Time: 14 seconds (ref 11.4–15.2)

## 2021-11-03 LAB — GLUCOSE, CAPILLARY
Glucose-Capillary: 215 mg/dL — ABNORMAL HIGH (ref 70–99)
Glucose-Capillary: 213 mg/dL — ABNORMAL HIGH (ref 70–99)

## 2021-11-03 MED ORDER — VANCOMYCIN HCL IN DEXTROSE 1-5 GM/200ML-% IV SOLN
1000.0000 mg | Freq: Once | INTRAVENOUS | Status: AC
  Administered 2021-11-03: 1000 mg via INTRAVENOUS
  Filled 2021-11-03: qty 200

## 2021-11-03 MED ORDER — INSULIN ASPART 100 UNIT/ML IJ SOLN
0.0000 [IU] | Freq: Three times a day (TID) | INTRAMUSCULAR | Status: DC
  Administered 2021-11-03: 5 [IU] via SUBCUTANEOUS
  Administered 2021-11-04 – 2021-11-05 (×2): 3 [IU] via SUBCUTANEOUS
  Administered 2021-11-05: 8 [IU] via SUBCUTANEOUS

## 2021-11-03 MED ORDER — METRONIDAZOLE 500 MG/100ML IV SOLN
500.0000 mg | Freq: Once | INTRAVENOUS | Status: AC
  Administered 2021-11-03: 500 mg via INTRAVENOUS
  Filled 2021-11-03: qty 100

## 2021-11-03 MED ORDER — ALBUTEROL SULFATE HFA 108 (90 BASE) MCG/ACT IN AERS
2.0000 | INHALATION_SPRAY | Freq: Four times a day (QID) | RESPIRATORY_TRACT | Status: DC
  Administered 2021-11-03 (×2): 2 via RESPIRATORY_TRACT
  Filled 2021-11-03: qty 6.7

## 2021-11-03 MED ORDER — INSULIN ASPART 100 UNIT/ML IJ SOLN
0.0000 [IU] | Freq: Every day | INTRAMUSCULAR | Status: DC
  Administered 2021-11-03: 2 [IU] via SUBCUTANEOUS

## 2021-11-03 MED ORDER — INSULIN DETEMIR 100 UNIT/ML ~~LOC~~ SOLN
20.0000 [IU] | Freq: Every day | SUBCUTANEOUS | Status: DC
  Administered 2021-11-03 – 2021-11-04 (×2): 20 [IU] via SUBCUTANEOUS
  Filled 2021-11-03 (×3): qty 0.2

## 2021-11-03 MED ORDER — HYDROCOD POLI-CHLORPHE POLI ER 10-8 MG/5ML PO SUER
5.0000 mL | Freq: Two times a day (BID) | ORAL | Status: DC | PRN
  Administered 2021-11-03: 5 mL via ORAL
  Filled 2021-11-03: qty 5

## 2021-11-03 MED ORDER — MOLNUPIRAVIR EUA 200MG CAPSULE
4.0000 | ORAL_CAPSULE | Freq: Two times a day (BID) | ORAL | Status: DC
  Administered 2021-11-03 – 2021-11-05 (×5): 800 mg via ORAL
  Filled 2021-11-03: qty 4

## 2021-11-03 MED ORDER — CHLORHEXIDINE GLUCONATE CLOTH 2 % EX PADS: 6.0000 | MEDICATED_PAD | Freq: Every day | CUTANEOUS | Status: DC

## 2021-11-03 MED ORDER — HYDRALAZINE HCL 20 MG/ML IJ SOLN: 10.0000 mg | INTRAMUSCULAR | Status: DC | PRN

## 2021-11-03 MED ORDER — OXYCODONE-ACETAMINOPHEN 5-325 MG PO TABS
1.0000 | ORAL_TABLET | Freq: Four times a day (QID) | ORAL | Status: DC | PRN
  Administered 2021-11-04: 1 via ORAL
  Filled 2021-11-03: qty 1

## 2021-11-03 MED ORDER — GUAIFENESIN-DM 100-10 MG/5ML PO SYRP
10.0000 mL | ORAL_SOLUTION | ORAL | Status: DC | PRN
  Administered 2021-11-03: 10 mL via ORAL
  Filled 2021-11-03: qty 10

## 2021-11-03 MED ORDER — GABAPENTIN 100 MG PO CAPS
100.0000 mg | ORAL_CAPSULE | Freq: Two times a day (BID) | ORAL | Status: DC
  Administered 2021-11-03 – 2021-11-05 (×4): 100 mg via ORAL
  Filled 2021-11-03 (×4): qty 1

## 2021-11-03 MED ORDER — ACETAMINOPHEN 325 MG PO TABS
650.0000 mg | ORAL_TABLET | Freq: Once | ORAL | Status: AC
  Administered 2021-11-03: 650 mg via ORAL
  Filled 2021-11-03: qty 2

## 2021-11-03 MED ORDER — METOPROLOL SUCCINATE ER 50 MG PO TB24
50.0000 mg | ORAL_TABLET | Freq: Every evening | ORAL | Status: DC
  Administered 2021-11-03: 50 mg via ORAL
  Filled 2021-11-03: qty 1

## 2021-11-03 MED ORDER — SODIUM CHLORIDE 0.9 % IV SOLN: 250.0000 mL | INTRAVENOUS | Status: DC | PRN

## 2021-11-03 MED ORDER — ALBUTEROL SULFATE HFA 108 (90 BASE) MCG/ACT IN AERS
2.0000 | INHALATION_SPRAY | Freq: Three times a day (TID) | RESPIRATORY_TRACT | Status: DC
  Administered 2021-11-04: 2 via RESPIRATORY_TRACT
  Filled 2021-11-03: qty 6.7

## 2021-11-03 MED ORDER — ACETAMINOPHEN 325 MG PO TABS
650.0000 mg | ORAL_TABLET | Freq: Four times a day (QID) | ORAL | Status: DC | PRN
  Administered 2021-11-03 (×2): 650 mg via ORAL
  Filled 2021-11-03 (×2): qty 2

## 2021-11-03 MED ORDER — CALCITRIOL 0.25 MCG PO CAPS
0.5000 ug | ORAL_CAPSULE | Freq: Every day | ORAL | Status: DC
  Administered 2021-11-04: 0.5 ug via ORAL
  Filled 2021-11-03: qty 2

## 2021-11-03 MED ORDER — NEPRO/CARBSTEADY PO LIQD: 237.0000 mL | Freq: Two times a day (BID) | ORAL | Status: DC

## 2021-11-03 MED ORDER — AZTREONAM 2 G IJ SOLR
2.0000 g | Freq: Once | INTRAMUSCULAR | Status: AC
  Administered 2021-11-03: 2 g via INTRAVENOUS
  Filled 2021-11-03: qty 10

## 2021-11-03 MED ORDER — ALBUTEROL SULFATE HFA 108 (90 BASE) MCG/ACT IN AERS: 2.0000 | INHALATION_SPRAY | RESPIRATORY_TRACT | Status: DC | PRN

## 2021-11-03 MED ORDER — SODIUM CHLORIDE 0.9% FLUSH: 3.0000 mL | INTRAVENOUS | Status: DC | PRN

## 2021-11-03 MED ORDER — ONDANSETRON HCL 4 MG/2ML IJ SOLN: 4.0000 mg | Freq: Four times a day (QID) | INTRAMUSCULAR | Status: DC | PRN

## 2021-11-03 MED ORDER — ROSUVASTATIN CALCIUM 10 MG PO TABS
10.0000 mg | ORAL_TABLET | Freq: Every morning | ORAL | Status: DC
  Administered 2021-11-04 – 2021-11-05 (×2): 10 mg via ORAL
  Filled 2021-11-03 (×2): qty 1

## 2021-11-03 MED ORDER — SODIUM CHLORIDE 0.9% FLUSH
3.0000 mL | Freq: Two times a day (BID) | INTRAVENOUS | Status: DC
  Administered 2021-11-03 – 2021-11-05 (×5): 3 mL via INTRAVENOUS

## 2021-11-03 MED ORDER — FUROSEMIDE 40 MG PO TABS
40.0000 mg | ORAL_TABLET | Freq: Two times a day (BID) | ORAL | Status: DC
  Administered 2021-11-03 – 2021-11-05 (×3): 40 mg via ORAL
  Filled 2021-11-03 (×3): qty 1

## 2021-11-03 MED ORDER — HYDRALAZINE HCL 25 MG PO TABS
50.0000 mg | ORAL_TABLET | Freq: Three times a day (TID) | ORAL | Status: DC
  Administered 2021-11-03 – 2021-11-05 (×6): 50 mg via ORAL
  Filled 2021-11-03 (×6): qty 2

## 2021-11-03 MED ORDER — AMLODIPINE BESYLATE 5 MG PO TABS
10.0000 mg | ORAL_TABLET | Freq: Every day | ORAL | Status: DC
  Administered 2021-11-03 – 2021-11-05 (×3): 10 mg via ORAL
  Filled 2021-11-03 (×3): qty 2

## 2021-11-03 MED ORDER — SODIUM BICARBONATE 650 MG PO TABS
650.0000 mg | ORAL_TABLET | Freq: Three times a day (TID) | ORAL | Status: DC
  Administered 2021-11-03 – 2021-11-05 (×5): 650 mg via ORAL
  Filled 2021-11-03 (×5): qty 1

## 2021-11-03 MED ORDER — ISOSORBIDE MONONITRATE ER 30 MG PO TB24
30.0000 mg | ORAL_TABLET | Freq: Every morning | ORAL | Status: DC
  Administered 2021-11-03 – 2021-11-05 (×3): 30 mg via ORAL
  Filled 2021-11-03 (×3): qty 1

## 2021-11-03 MED ORDER — ONDANSETRON HCL 4 MG PO TABS: 4.0000 mg | ORAL_TABLET | Freq: Four times a day (QID) | ORAL | Status: DC | PRN

## 2021-11-03 MED ORDER — SEVELAMER CARBONATE 800 MG PO TABS
800.0000 mg | ORAL_TABLET | Freq: Three times a day (TID) | ORAL | Status: DC
  Administered 2021-11-03 – 2021-11-04 (×2): 800 mg via ORAL
  Filled 2021-11-03 (×3): qty 1

## 2021-11-03 NOTE — Progress Notes (Signed)
   Nephrology Nursing Note:   HBV serologies obtained from [place]:   Infectious Diseases Result Type Result Value Relevant Reference Range Interpretation Date  Hep B core Ab Total (anti-HBc) Negative Negative - January 13, 2021  HCV Ab (anti-HCV) Reactive Non-Reactive Abnormal January 13, 2021  HCV, RNA, PCR, Quantitative Not Detected Not Detected - April 09, 2021  Hep B Surface Ab (anti-HBs) > 1000 mIU/mL < 10 mIU/mL, Non- Immune  - Aug 06, 2021  Hep B Surface Ag (HBsAg) Negative Negative - October 08, 2021      Rockwell Alexandria, RN

## 2021-11-03 NOTE — H&P (Addendum)
History and Physical    Latoya Walsh WKM:[id number] DOB: October 18, 1960 DOA: 11/03/2021  PCP: Pcp, No   Patient coming from: Home  Chief Complaint: Fever and chills  HPI: Latoya Walsh is a 61 y.o. female with medical history significant for chronic diastolic CHF, chronic hepatitis C, hypertension, dyslipidemia, type 2 diabetes, and ESRD on HD MWF who was not "acting right" according to family members before trying to go to dialysis this morning.  She was stumbling into her arm and complaining of some left arm pain.  Patient currently cannot give adequate history and she keeps mumbling that she is cold and was having fevers at home.   ED Course: Vital signs with some initial tachycardia as noted on EKG along with temperature elevation of 100.7 Fahrenheit.  Lactic acid 2.2.  Hemoglobin 8.7.  Creatinine 6.4.  Urine analysis and chest x-ray without acute findings.  She has been started on Flagyl, aztreonam, and vancomycin.  Nephrology has been contacted by EDP with plans for dialysis while inpatient.  COVID testing has just returned positive.  Review of Systems: Reviewed as noted above, otherwise negative.  Past Medical History:  Diagnosis Date   Allergic rhinitis    Anxiety    CHF (congestive heart failure) ([employer])    a. EF 30-35% by echo in 05/2020 b. EF at 45% in 09/2020   Chronic back pain    Chronic hepatitis C without hepatic coma ([place])    COVID-19 virus infection 12/26/2019   Depression    Diabetes mellitus    Hepatitis C    Hypertension    Noncompliance    Poor appetite 07/17/2014   Substance abuse ([place])    HX of drug use and alcohol use    Past Surgical History:  Procedure Laterality Date   APPENDECTOMY  2004   AV FISTULA PLACEMENT Left 11/26/2020   Procedure: LEFT ARM ARTERIOVENOUS (AV) FISTULA CREATION;  Surgeon: [name], MD;  Location: [facility];  Service: Vascular;  Laterality: Left;   AV FISTULA PLACEMENT Left 06/03/2021   Procedure: INSERTION OF LEFT UPPER ARM  ARTERIOVENOUS (AV) GORE-TEX GRAFT;  Surgeon: [name], MD;  Location: [facility];  Service: Vascular;  Laterality: Left;   IR FLUORO GUIDE CV LINE RIGHT  12/25/2020   IR US GUIDE VASC ACCESS RIGHT  12/25/2020   LIGATION ARTERIOVENOUS GORTEX GRAFT Left 08/05/2021   Procedure: LIGATION OF LEFT ARM ARTERIOVENOUS GORTEX GRAFT;  Surgeon: [name], MD;  Location: [facility];  Service: Vascular;  Laterality: Left;     reports that she has been smoking cigarettes. She has been smoking an average of .25 packs per day. She has never used smokeless tobacco. She reports that she does not currently use alcohol after a past usage of about 40.0 standard drinks of alcohol per week. She reports that she does not currently use drugs after having used the following drugs: Marijuana and Cocaine.  Allergies  Allergen Reactions   Penicillins Itching and Rash    Family History  Problem Relation Age of Onset   Diabetes Sister        Twin - AIDS     Prior to Admission medications   Medication Sig Start Date End Date Taking? Authorizing Provider  ACCU-CHEK GUIDE test strip Use to check glucose twice daily 03/11/21   [name], NP  Accu-Chek Softclix Lancets lancets 2 (two) times daily. 11/29/20   [provider]  amLODipine (NORVASC) 10 MG tablet Take 10 mg by mouth  daily. 02/10/21   [provider]  betamethasone dipropionate 0.05 % cream Apply topically 2 (two) times daily. 08/14/21   [name], MD  blood glucose meter kit and supplies Dispense based on patient and insurance preference. Use to monitor glucose twice daily. 11/29/20   [name], NP  calcitRIOL (ROCALTROL) 0.25 MCG capsule Take 0.5 mcg by mouth daily.    [provider]  Cholecalciferol (VITAMIN D3) 25 MCG (1000 UT) CAPS Take 1 capsule by mouth daily. 02/10/21   [provider]  furosemide (LASIX) 80 MG tablet Take 80 mg  (1 tablet) in the morning and 40 mg (half tablet) in the afternoon.  10/16/20   [name], MD  gabapentin (NEURONTIN) 100 MG capsule TAKE ONE CAPSULE BY MOUTH EVERY MORNING and TAKE ONE CAPSULE BY MOUTH EVERYDAY AT BEDTIME 09/30/21   [name], MD  glipiZIDE (GLUCOTROL XL) 5 MG 24 hr tablet TAKE ONE TABLET BY MOUTH EVERY MORNING 09/05/21   [name], MD  hydrALAZINE (APRESOLINE) 50 MG tablet TAKE ONE TABLET BY MOUTH THREE TIMES DAILY 06/30/21   [name], MD  hydrOXYzine (VISTARIL) 50 MG capsule TAKE ONE CAPSULE EVERYDAY AT BEDTIME FOR SLEEP 09/30/21   [name], MD  insulin glargine (LANTUS) 100 UNIT/ML Solostar Pen Inject 35 Units into the skin at bedtime. 05/15/21   [name], NP  Insulin Pen Needle (PEN NEEDLES) 31G X 5 MM MISC Use to inject insulin once daily 11/04/20   [name], NP  isosorbide mononitrate (IMDUR) 30 MG 24 hr tablet TAKE ONE TABLET BY MOUTH EVERY MORNING 10/07/21   [name], MD  LEVEMIR FLEXTOUCH 100 UNIT/ML FlexTouch Pen INJECT 35 UNITS into THE SKIN AT BEDTIME 08/04/21   [name], MD  metoprolol succinate (TOPROL-XL) 50 MG 24 hr tablet TAKE ONE TABLET BY MOUTH EVERY EVENING 10/07/21   [name], MD  mirtazapine (REMERON) 15 MG tablet TAKE ONE TABLET BY MOUTH EVERY EVENING 09/30/21   [name], MD  oxyCODONE-acetaminophen (PERCOCET) 5-325 MG tablet Take 1 tablet by mouth every 6 (six) hours as needed for severe pain. 06/03/21   Early, Arvilla Meres, MD  oxyCODONE-acetaminophen (PERCOCET) 5-325 MG tablet Take 1 tablet by mouth every 6 (six) hours as needed for severe pain. 08/05/21   [name], MD  rosuvastatin (CRESTOR) 10 MG tablet TAKE ONE TABLET BY MOUTH EVERY MORNING 10/07/21   [name], MD  sevelamer carbonate (RENVELA) 800 MG tablet Take 800 mg by mouth 3 (three) times daily with meals.    [provider]  sodium bicarbonate 650 MG tablet Take 650 mg by mouth 3 (three) times daily. 08/08/20   [provider]    Physical  Exam: Vitals:   11/03/21 1030 11/03/21 1100 11/03/21 1130 11/03/21 1200  BP: (!) 186/92 (!) 166/73 (!) 189/87 (!) 199/96  Pulse: 100 98 87 98  Resp: 17 19 12 16   Temp:      TempSrc:      SpO2: 93% 99% 100% 100%  Weight:      Height:        Constitutional: NAD, calm, comfortable Vitals:   11/03/21 1030 11/03/21 1100 11/03/21 1130 11/03/21 1200  BP: (!) 186/92 (!) 166/73 (!) 189/87 (!) 199/96  Pulse: 100 98 87 98  Resp: 17 19 12 16   Temp:      TempSrc:      SpO2: 93% 99% 100% 100%  Weight:  Height:       Eyes: lids and conjunctivae normal Neck: normal, supple Respiratory: clear to auscultation bilaterally. Normal respiratory effort. No accessory muscle use.  Cardiovascular: Regular rate and rhythm, no murmurs. Abdomen: no tenderness, no distention. Bowel sounds positive.  Musculoskeletal:  No edema.  Fistula to left arm with palpable thrill, no erythema or discharge Skin: no rashes, lesions, ulcers.  Right chest catheter C/C/I Psychiatric: Flat affect  Labs on Admission: I have personally reviewed following labs and imaging studies  CBC: Recent Labs  Lab 11/03/21 0830  WBC 9.6  NEUTROABS 7.9*  HGB 8.7*  HCT 31.7*  MCV 73.4*  PLT 921   Basic Metabolic Panel: Recent Labs  Lab 11/03/21 0830  NA 135  K 4.2  CL 101  CO2 23  GLUCOSE 372*  BUN 37*  CREATININE 6.54*  CALCIUM 9.0   GFR: Estimated Creatinine Clearance: 8.8 mL/min (A) (by C-G formula based on SCr of 6.54 mg/dL (H)). Liver Function Tests: Recent Labs  Lab 11/03/21 0830  AST 14*  ALT 14  ALKPHOS 83  BILITOT 0.6  PROT 7.3  ALBUMIN 3.3*   No results for input(s): "LIPASE", "AMYLASE" in the last 168 hours. No results for input(s): "AMMONIA" in the last 168 hours. Coagulation Profile: Recent Labs  Lab 11/03/21 0830  INR 1.1   Cardiac Enzymes: No results for input(s): "CKTOTAL", "CKMB", "CKMBINDEX", "TROPONINI" in the last 168 hours. BNP (last 3 results) No results for input(s):  "PROBNP" in the last 8760 hours. HbA1C: No results for input(s): "HGBA1C" in the last 72 hours. CBG: No results for input(s): "GLUCAP" in the last 168 hours. Lipid Profile: No results for input(s): "CHOL", "HDL", "LDLCALC", "TRIG", "CHOLHDL", "LDLDIRECT" in the last 72 hours. Thyroid Function Tests: No results for input(s): "TSH", "T4TOTAL", "FREET4", "T3FREE", "THYROIDAB" in the last 72 hours. Anemia Panel: No results for input(s): "VITAMINB12", "FOLATE", "FERRITIN", "TIBC", "IRON", "RETICCTPCT" in the last 72 hours. Urine analysis:    Component Value Date/Time   COLORURINE YELLOW 11/03/2021 1010   APPEARANCEUR CLEAR 11/03/2021 1010   LABSPEC 1.012 11/03/2021 1010   PHURINE 7.0 11/03/2021 1010   GLUCOSEU >=500 (A) 11/03/2021 1010   HGBUR NEGATIVE 11/03/2021 1010   BILIRUBINUR NEGATIVE 11/03/2021 1010   KETONESUR NEGATIVE 11/03/2021 1010   PROTEINUR >=300 (A) 11/03/2021 1010   UROBILINOGEN 0.2 10/19/2014 1023   NITRITE NEGATIVE 11/03/2021 1010   LEUKOCYTESUR NEGATIVE 11/03/2021 1010    Radiological Exams on Admission: DG Chest Port 1 View  Result Date: 11/03/2021 CLINICAL DATA:  Questionable sepsis EXAM: PORTABLE CHEST 1 VIEW COMPARISON:  12/11/2020 FINDINGS: The heart size and mediastinal contours are within normal limits. Large-bore right neck multi lumen vascular catheter. Both lungs are clear. The visualized skeletal structures are unremarkable. IMPRESSION: No acute abnormality of the lungs in [facility] portable projection. Electronically Signed   By: Delanna Ahmadi M.D.   On: 11/03/2021 08:53    EKG: Independently reviewed. ST 117bpm.  Assessment/Plan Principal Problem:   COVID-19 virus infection Active Problems:   Uncontrolled type 2 diabetes mellitus with hyperglycemia (HCC)   Anxiety and depression   Chronic hepatitis C without hepatic coma (HCC)   CHF (congestive heart failure) (HCC)   ESRD (end stage renal disease) (HCC)    COVID infection -No current hypoxemia  noted -Chest x-ray and urine analysis without any signs of infection -Blood cultures ordered and pending -MRSA PCR -Check procalcitonin and inflammatory lab work -Hold further antibiotics -Start Molnupiravir and hold steroids for now  Acute metabolic  encephalopathy secondary to above -Continues to have some confusion and behavioral disturbance -Continue monitoring  ESRD on HD -Anticipate hemodialysis today, appreciate nephrology consultation -Continue sodium bicarbonate, calcitriol, and sevelamer  Anemia of CKD -Transfuse for hemoglobin less than 7 -ESA per nephrology -Follow CBC with no overt bleeding currently noted  Dyslipidemia -Continue home statin  Type 2 diabetes with hyperglycemia -Avoid home oral agents and started on SSI -Carb modified/renal diet  Hypertension-elevated pressures -Resume home metoprolol, hydralazine, and amlodipine -Anticipate hemodialysis today  Chronic diastolic CHF -Prior 2D echocardiogram 5/23 with preserved LVEF and grade 1 diastolic dysfunction  Chronic hepatitis C   DVT prophylaxis: SCDs Code Status: Full Family Communication:None at bedside  Disposition Plan:Admit for possible sepsis Consults called: Nephrology by EDP Admission status: Inpatient, telemetry  Severity of Illness: The appropriate patient status for this patient is INPATIENT. Inpatient status is judged to be reasonable and necessary in order to provide the required intensity of service to ensure the patient's safety. The patient's presenting symptoms, physical exam findings, and initial radiographic and laboratory data in the context of their chronic comorbidities is felt to place them at high risk for further clinical deterioration. Furthermore, it is not anticipated that the patient will be medically stable for discharge from the hospital within 2 midnights of admission.   * I certify that at the point of admission it is my clinical judgment that the patient will require  inpatient hospital care spanning beyond 2 midnights from the point of admission due to high intensity of service, high risk for further deterioration and high frequency of surveillance required.*   Nox Talent D Andres Vest DO [facility]  If 7PM-7AM, please contact night-coverage [email or web address]  11/03/2021, 12:21 PM

## 2021-11-03 NOTE — ED Triage Notes (Addendum)
Pt from home via [facility]. Family states she was supposed to go to dialysis this morning but hasn't been "acting right". Was stumbling to her car trying to go to dialysis this morning. Pt c/o left arm pain. BS 409.

## 2021-11-03 NOTE — Progress Notes (Signed)
I was informed that this patient is to get admitted for symptomatic covid it seems.  Today is her dialysis day but due to high dialysis patient volume we will not be able to run her today -  plan for dialysis tomorrow off schedule   Louis Meckel

## 2021-11-03 NOTE — Sepsis Progress Note (Signed)
Sepsis protocol monitored by eLink 

## 2021-11-03 NOTE — ED Notes (Signed)
Hospitalist informed of VS. Awaiting orders.

## 2021-11-03 NOTE — ED Provider Notes (Signed)
[facility] EMERGENCY DEPARTMENT Provider Note   CSN: [id number] Arrival date & time: 11/03/21  0809     History {Add pertinent medical, surgical, social history, OB history to HPI:1} Chief Complaint  Patient presents with   Altered Mental Status    Latoya Walsh is a 61 y.o. female.  Patient has a history of diabetes hypertension and renal failure with dialysis heart failure hepatitis C.  She presents today with fevers chills and weakness   Altered Mental Status      Home Medications Prior to Admission medications   Medication Sig Start Date End Date Taking? Authorizing Provider  ACCU-CHEK GUIDE test strip Use to check glucose twice daily 03/11/21   [name], NP  Accu-Chek Softclix Lancets lancets 2 (two) times daily. 11/29/20   [provider]  amLODipine (NORVASC) 10 MG tablet Take 10 mg by mouth daily. 02/10/21   [provider]  betamethasone dipropionate 0.05 % cream Apply topically 2 (two) times daily. 08/14/21   [name], MD  blood glucose meter kit and supplies Dispense based on patient and insurance preference. Use to monitor glucose twice daily. 11/29/20   [name], NP  calcitRIOL (ROCALTROL) 0.25 MCG capsule Take 0.5 mcg by mouth daily.    [provider]  Cholecalciferol (VITAMIN D3) 25 MCG (1000 UT) CAPS Take 1 capsule by mouth daily. 02/10/21   [provider]  furosemide (LASIX) 80 MG tablet Take 80 mg  (1 tablet) in the morning and 40 mg (half tablet) in the afternoon. 10/16/20   [name], MD  gabapentin (NEURONTIN) 100 MG capsule TAKE ONE CAPSULE BY MOUTH EVERY MORNING and TAKE ONE CAPSULE BY MOUTH EVERYDAY AT BEDTIME 09/30/21   [name], MD  glipiZIDE (GLUCOTROL XL) 5 MG 24 hr tablet TAKE ONE TABLET BY MOUTH EVERY MORNING 09/05/21   [name], MD  hydrALAZINE (APRESOLINE) 50 MG tablet TAKE ONE TABLET BY MOUTH THREE TIMES DAILY 06/30/21   [name], MD   hydrOXYzine (VISTARIL) 50 MG capsule TAKE ONE CAPSULE EVERYDAY AT BEDTIME FOR SLEEP 09/30/21   [name], MD  insulin glargine (LANTUS) 100 UNIT/ML Solostar Pen Inject 35 Units into the skin at bedtime. 05/15/21   [name], NP  Insulin Pen Needle (PEN NEEDLES) 31G X 5 MM MISC Use to inject insulin once daily 11/04/20   [name], NP  isosorbide mononitrate (IMDUR) 30 MG 24 hr tablet TAKE ONE TABLET BY MOUTH EVERY MORNING 10/07/21   [name], MD  LEVEMIR FLEXTOUCH 100 UNIT/ML FlexTouch Pen INJECT 35 UNITS into THE SKIN AT BEDTIME 08/04/21   [name], MD  metoprolol succinate (TOPROL-XL) 50 MG 24 hr tablet TAKE ONE TABLET BY MOUTH EVERY EVENING 10/07/21   [name], MD  mirtazapine (REMERON) 15 MG tablet TAKE ONE TABLET BY MOUTH EVERY EVENING 09/30/21   [name], MD  oxyCODONE-acetaminophen (PERCOCET) 5-325 MG tablet Take 1 tablet by mouth every 6 (six) hours as needed for severe pain. 06/03/21   Early, Arvilla Meres, MD  oxyCODONE-acetaminophen (PERCOCET) 5-325 MG tablet Take 1 tablet by mouth every 6 (six) hours as needed for severe pain. 08/05/21   [name], MD  rosuvastatin (CRESTOR) 10 MG tablet TAKE ONE TABLET BY MOUTH EVERY MORNING 10/07/21   [name], MD  sevelamer carbonate (RENVELA) 800 MG tablet Take 800 mg by mouth 3 (three) times daily with meals.    [provider]  sodium bicarbonate 650 MG tablet Take 650 mg by mouth 3 (three) times daily. 08/08/20   [provider]      Allergies    Penicillins    Review of Systems   Review of Systems  Physical Exam Updated Vital Signs BP (!) 189/87   Pulse 87   Temp 99.5 F (37.5 C) (Oral)   Resp 12   Ht 5' 6"  (1.676 m)   Wt 65 kg   LMP 06/23/2010   SpO2 100%   BMI 23.13 kg/m  Physical Exam  ED Results / Procedures / Treatments   Labs (all labs ordered are listed, but only abnormal results are displayed) Labs Reviewed  LACTIC ACID, PLASMA -  Abnormal; Notable for the following components:      Result Value   Lactic Acid, Venous 2.2 (*)    All other components within normal limits  COMPREHENSIVE METABOLIC PANEL - Abnormal; Notable for the following components:   Glucose, Bld 372 (*)    BUN 37 (*)    Creatinine, Ser 6.54 (*)    Albumin 3.3 (*)    AST 14 (*)    GFR, Estimated 7 (*)    All other components within normal limits  CBC WITH DIFFERENTIAL/PLATELET - Abnormal; Notable for the following components:   Hemoglobin 8.7 (*)    HCT 31.7 (*)    MCV 73.4 (*)    MCH 20.1 (*)    MCHC 27.4 (*)    RDW 17.6 (*)    nRBC 0.4 (*)    Neutro Abs 7.9 (*)    All other components within normal limits  APTT - Abnormal; Notable for the following components:   aPTT 23 (*)    All other components within normal limits  URINALYSIS, ROUTINE W REFLEX MICROSCOPIC - Abnormal; Notable for the following components:   Glucose, UA >=500 (*)    Protein, ur >=300 (*)    All other components within normal limits  CULTURE, BLOOD (ROUTINE X 2)  CULTURE, BLOOD (ROUTINE X 2)  RESP PANEL BY RT-PCR (FLU A&B, COVID) [id number]  URINE CULTURE  PROTIME-INR  LACTIC ACID, PLASMA  POC URINE PREG, ED    EKG None  Radiology DG Chest Port 1 View  Result Date: 11/03/2021 CLINICAL DATA:  Questionable sepsis EXAM: PORTABLE CHEST 1 VIEW COMPARISON:  12/11/2020 FINDINGS: The heart size and mediastinal contours are within normal limits. Large-bore right neck multi lumen vascular catheter. Both lungs are clear. The visualized skeletal structures are unremarkable. IMPRESSION: No acute abnormality of the lungs in AP portable projection. Electronically Signed   By: Delanna Ahmadi M.D.   On: 11/03/2021 08:53    Procedures Procedures  {Document cardiac monitor, telemetry assessment procedure when appropriate:1}  Medications Ordered in ED Medications  aztreonam (AZACTAM) 2 g in sodium chloride 0.9 % 100 mL IVPB (0 g Intravenous Stopped 11/03/21 1033)  metroNIDAZOLE  (FLAGYL) IVPB 500 mg (0 mg Intravenous Stopped 11/03/21 1033)  vancomycin (VANCOCIN) IVPB 1000 mg/200 mL premix (0 mg Intravenous Stopped 11/03/21 1141)  acetaminophen (TYLENOL) tablet 650 mg (650 mg Oral Given 11/03/21 0851)    ED Course/ Medical Decision Making/ A&P  CRITICAL CARE Performed by: Milton Ferguson Total critical care time: 45 minutes Critical care time was exclusive of separately billable procedures and treating other patients. Critical care was necessary to treat or prevent imminent or life-threatening deterioration. Critical care was time spent personally by me on the following activities: development of treatment plan with patient and/or surrogate  as well as nursing, discussions with consultants, evaluation of patient's response to treatment, examination of patient, obtaining history from patient or surrogate, ordering and performing treatments and interventions, ordering and review of laboratory studies, ordering and review of radiographic studies, pulse oximetry and re-evaluation of patient's condition.   Patient with fevers and chills.  Sepsis work-up was started.  Lactic acid was 2.2 and urinalysis was unremarkable.  I spoke with nephrology who agreed with admitting the patient to the hospital with antibiotics and nephrology will see the patient.                         Medical Decision Making Amount and/or Complexity of Data Reviewed Labs: ordered. Radiology: ordered. ECG/medicine tests: ordered.  Risk OTC drugs. Prescription drug management. Decision regarding hospitalization.   Febrile illness and possible sepsis with renal failure.  Patient will be admitted to medicine and given antibiotics and will be seen by nephrology  {Document critical care time when appropriate:1} {Document review of labs and clinical decision tools ie heart score, Chads2Vasc2 etc:1}  {Document your independent review of radiology images, and any outside records:1} {Document your discussion  with family members, caretakers, and with consultants:1} {Document social determinants of health affecting pt's care:1} {Document your decision making why or why not admission, treatments were needed:1} Final Clinical Impression(s) / ED Diagnoses Final diagnoses:  Febrile illness    Rx / DC Orders ED Discharge Orders     None

## 2021-11-03 NOTE — Progress Notes (Signed)
   11/03/21 1530  Assess: MEWS Score  Temp (!) 103 F (39.4 C) (gave prn tylenol at 1448)  BP (!) 175/93  MAP (mmHg) 117  Pulse Rate (!) 111  Resp 20  SpO2 100 %  O2 Device Room Air  Assess: MEWS Score  MEWS Temp 2  MEWS Systolic 0  MEWS Pulse 2  MEWS RR 0  MEWS LOC 0  MEWS Score 4  MEWS Score Color Red  Treat  Pain Scale 0-10  Pain Score 0  Take Vital Signs  Increase Vital Sign Frequency  Red: Q 1hr X 4 then Q 4hr X 4, if remains red, continue Q 4hrs  Escalate  MEWS: Escalate Red: discuss with charge nurse/RN and provider, consider discussing with RRT  Notify: Charge Nurse/RN  Name of Charge Nurse/RN Notified Audrea Muscat RN  Date Charge Nurse/RN Notified 11/03/21  Time Charge Nurse/RN Notified [id number]  Notify: Provider  Provider Name/Title Manuella Ghazi  Date Provider Notified 11/03/21  Time Provider Notified 1542  Method of Notification Page  Notification Reason Other (Comment) (mews score change)  Provider response No new orders  Document  Progress note created (see row info) Yes  Assess: SIRS CRITERIA  SIRS Temperature  1  SIRS Pulse 1  SIRS Respirations  0  SIRS WBC 1  SIRS Score Sum  3   MD notified no new orders at this time. Gave scheduled blood pressure medicine at 1530 and prn tylenol at 1448

## 2021-11-04 DIAGNOSIS — U071 COVID-19: Secondary | ICD-10-CM | POA: Diagnosis not present

## 2021-11-04 LAB — COMPREHENSIVE METABOLIC PANEL
ALT: 11 U/L (ref 0–44)
AST: 12 U/L — ABNORMAL LOW (ref 15–41)
Albumin: 2.6 g/dL — ABNORMAL LOW (ref 3.5–5.0)
Alkaline Phosphatase: 66 U/L (ref 38–126)
Anion gap: 10 (ref 5–15)
BUN: 45 mg/dL — ABNORMAL HIGH (ref 6–20)
CO2: 22 mmol/L (ref 22–32)
Calcium: 8.6 mg/dL — ABNORMAL LOW (ref 8.9–10.3)
Chloride: 107 mmol/L (ref 98–111)
Creatinine, Ser: 7.37 mg/dL — ABNORMAL HIGH (ref 0.44–1.00)
GFR, Estimated: 6 mL/min — ABNORMAL LOW (ref >60)
Glucose, Bld: 96 mg/dL (ref 70–99)
Potassium: 4.2 mmol/L (ref 3.5–5.1)
Sodium: 139 mmol/L (ref 135–145)
Total Bilirubin: 0.3 mg/dL (ref 0.3–1.2)
Total Protein: 6.1 g/dL — ABNORMAL LOW (ref 6.5–8.1)

## 2021-11-04 LAB — GLUCOSE, CAPILLARY
Glucose-Capillary: 95 mg/dL (ref 70–99)
Glucose-Capillary: 188 mg/dL — ABNORMAL HIGH (ref 70–99)
Glucose-Capillary: 131 mg/dL — ABNORMAL HIGH (ref 70–99)
Glucose-Capillary: 198 mg/dL — ABNORMAL HIGH (ref 70–99)

## 2021-11-04 LAB — MRSA NEXT GEN BY PCR, NASAL: MRSA by PCR Next Gen: NOT DETECTED

## 2021-11-04 LAB — CBC WITH DIFFERENTIAL/PLATELET
Abs Immature Granulocytes: 0.04 10*3/uL (ref 0.00–0.07)
Basophils Absolute: 0 10*3/uL (ref 0.0–0.1)
Basophils Relative: 1 %
Eosinophils Absolute: 0 10*3/uL (ref 0.0–0.5)
Eosinophils Relative: 0 %
HCT: 29.2 % — ABNORMAL LOW (ref 36.0–46.0)
Hemoglobin: 7.9 g/dL — ABNORMAL LOW (ref 12.0–15.0)
Immature Granulocytes: 1 %
Lymphocytes Relative: 19 %
Lymphs Abs: 1.3 10*3/uL (ref 0.7–4.0)
MCH: 20.1 pg — ABNORMAL LOW (ref 26.0–34.0)
MCHC: 27.1 g/dL — ABNORMAL LOW (ref 30.0–36.0)
MCV: 74.1 fL — ABNORMAL LOW (ref 80.0–100.0)
Monocytes Absolute: 0.8 10*3/uL (ref 0.1–1.0)
Monocytes Relative: 11 %
Neutro Abs: 4.9 10*3/uL (ref 1.7–7.7)
Neutrophils Relative %: 68 %
Platelets: 183 10*3/uL (ref 150–400)
RBC: 3.94 MIL/uL (ref 3.87–5.11)
RDW: 17.7 % — ABNORMAL HIGH (ref 11.5–15.5)
WBC: 7.1 10*3/uL (ref 4.0–10.5)
nRBC: 0.3 % — ABNORMAL HIGH (ref 0.0–0.2)

## 2021-11-04 LAB — MAGNESIUM: Magnesium: 2.1 mg/dL (ref 1.7–2.4)

## 2021-11-04 LAB — LACTIC ACID, PLASMA: Lactic Acid, Venous: 1.2 mmol/L (ref 0.5–1.9)

## 2021-11-04 LAB — C-REACTIVE PROTEIN: CRP: 2.5 mg/dL — ABNORMAL HIGH (ref <1.0)

## 2021-11-04 LAB — D-DIMER, QUANTITATIVE: D-Dimer, Quant: 0.97 ug/mL-FEU — ABNORMAL HIGH (ref 0.00–0.50)

## 2021-11-04 MED ORDER — PROSOURCE PLUS PO LIQD
30.0000 mL | Freq: Two times a day (BID) | ORAL | Status: DC
  Filled 2021-11-04: qty 30

## 2021-11-04 MED ORDER — HEPARIN SODIUM (PORCINE) 1000 UNIT/ML IJ SOLN
INTRAMUSCULAR | Status: AC
  Administered 2021-11-04: 3200 [IU]
  Filled 2021-11-04: qty 5

## 2021-11-04 MED ORDER — HEPARIN SODIUM (PORCINE) 1000 UNIT/ML DIALYSIS: 1000.0000 [IU] | INTRAMUSCULAR | Status: DC | PRN

## 2021-11-04 MED ORDER — CALCITRIOL 0.25 MCG PO CAPS
1.0000 ug | ORAL_CAPSULE | ORAL | Status: DC
  Administered 2021-11-04: 1 ug via ORAL
  Filled 2021-11-04: qty 4

## 2021-11-04 MED ORDER — RENA-VITE PO TABS
1.0000 | ORAL_TABLET | Freq: Every day | ORAL | Status: DC
  Administered 2021-11-04: 1 via ORAL
  Filled 2021-11-04: qty 1

## 2021-11-04 MED ORDER — DARBEPOETIN ALFA 200 MCG/0.4ML IJ SOSY
200.0000 ug | PREFILLED_SYRINGE | INTRAMUSCULAR | Status: DC
Start: 2021-11-04 — End: 2021-11-05
  Filled 2021-11-04: qty 0.4

## 2021-11-04 MED ORDER — DARBEPOETIN ALFA 200 MCG/0.4ML IJ SOSY
PREFILLED_SYRINGE | INTRAMUSCULAR | Status: AC
  Administered 2021-11-04: 200 ug via INTRAVENOUS
  Filled 2021-11-04: qty 0.4

## 2021-11-04 MED ORDER — ALTEPLASE 2 MG IJ SOLR: 2.0000 mg | Freq: Once | INTRAMUSCULAR | Status: DC | PRN

## 2021-11-04 MED ORDER — CHLORHEXIDINE GLUCONATE CLOTH 2 % EX PADS
6.0000 | MEDICATED_PAD | Freq: Every day | CUTANEOUS | Status: DC
  Administered 2021-11-05: 6 via TOPICAL

## 2021-11-04 NOTE — Progress Notes (Signed)
PROGRESS NOTE    LAILANIE HASLEY  XVQ:[id number] DOB: 1960-09-16 DOA: 11/03/2021 PCP: Pcp, No   Brief Narrative:   DEZIRAE SERVICE is a 61 y.o. female with medical history significant for chronic diastolic CHF, chronic hepatitis C, hypertension, dyslipidemia, type 2 diabetes, and ESRD on HD MWF who was not "acting right" according to family members before trying to go to dialysis this morning.  She was noted to have fevers and chills and has been diagnosed with COVID infection and has been started on molnupiravir.  Assessment & Plan:   Principal Problem:   COVID-19 virus infection Active Problems:   Uncontrolled type 2 diabetes mellitus with hyperglycemia (HCC)   Anxiety and depression   Chronic hepatitis C without hepatic coma (HCC)   CHF (congestive heart failure) (HCC)   ESRD (end stage renal disease) (HCC)  Assessment and Plan:   COVID infection -No current hypoxemia noted -Chest x-ray and urine analysis without any signs of infection -MRSA PCR -Procalcitonin minimally elevated, blood cultures pending -Hold further antibiotics -Start Molnupiravir and hold steroids for now   Acute metabolic encephalopathy secondary to above -Continues to have some confusion and behavioral disturbance -Continue monitoring   ESRD on HD -Anticipate hemodialysis today, appreciate nephrology consultation -Continue sodium bicarbonate, calcitriol, and sevelamer   Anemia of CKD -Transfuse for hemoglobin less than 7 -ESA per nephrology -Follow CBC with no overt bleeding currently noted   Dyslipidemia -Continue home statin   Type 2 diabetes with hyperglycemia -Avoid home oral agents and started on SSI -Carb modified/renal diet   Hypertension-elevated pressures -Resume home metoprolol, hydralazine, and amlodipine -Anticipate hemodialysis today   Chronic diastolic CHF -Prior 2D echocardiogram 5/23 with preserved LVEF and grade 1 diastolic dysfunction   Chronic hepatitis C     DVT prophylaxis:SCDs Code Status: Full Family Communication:None at bedside  Disposition Plan:  Status is: Inpatient Remains inpatient appropriate because: Ongoing need for IV meds.   Consultants:  Nephrology  Procedures:  None  Antimicrobials:  Anti-infectives (From admission, onward)    Start     Dose/Rate Route Frequency Ordered Stop   11/03/21 1400  molnupiravir EUA (LAGEVRIO) capsule 800 mg        4 capsule Oral 2 times daily 11/03/21 1226 11/08/21 0959   11/03/21 0845  aztreonam (AZACTAM) 2 g in sodium chloride 0.9 % 100 mL IVPB        2 g 200 mL/hr over 30 Minutes Intravenous  Once 11/03/21 0831 11/03/21 1033   11/03/21 0845  metroNIDAZOLE (FLAGYL) IVPB 500 mg        500 mg 100 mL/hr over 60 Minutes Intravenous  Once 11/03/21 0831 11/03/21 1033   11/03/21 0845  vancomycin (VANCOCIN) IVPB 1000 mg/200 mL premix        1,000 mg 200 mL/hr over 60 Minutes Intravenous  Once 11/03/21 0831 11/03/21 1141      Subjective: Patient seen and evaluated today with ongoing feeling of chills and noted fever overnight.  Antibiotics have been discontinued and blood cultures are pending.  Objective: Vitals:   11/03/21 2300 11/04/21 0237 11/04/21 0629 11/04/21 0809  BP:  117/75 (!) 152/89   Pulse: (!) 101 86 97   Resp:  18 20   Temp: 99.8 F (37.7 C) 99.8 F (37.7 C) 99.6 F (37.6 C)   TempSrc:  Oral Oral   SpO2:  100% 100% 100%  Weight:      Height:        Intake/Output Summary (Last 24 hours) at  11/04/2021 1136 Last data filed at 11/04/2021 0900 Gross per 24 hour  Intake 480 ml  Output 200 ml  Net 280 ml   Filed Weights   11/03/21 0817  Weight: 65 kg    Examination:  General exam: Appears calm and comfortable  Respiratory system: Clear to auscultation. Respiratory effort normal.  Current on room air. Cardiovascular system: S1 & S2 heard, RRR.  Gastrointestinal system: Abdomen is soft Central nervous system: Alert and awake Extremities: No edema Skin: No  significant lesions noted Psychiatry: Flat affect.    Data Reviewed: I have personally reviewed following labs and imaging studies  CBC: Recent Labs  Lab 11/03/21 0830 11/04/21 0500  WBC 9.6 7.1  NEUTROABS 7.9* 4.9  HGB 8.7* 7.9*  HCT 31.7* 29.2*  MCV 73.4* 74.1*  PLT 215 308   Basic Metabolic Panel: Recent Labs  Lab 11/03/21 0830 11/04/21 0500  NA 135 139  K 4.2 4.2  CL 101 107  CO2 23 22  GLUCOSE 372* 96  BUN 37* 45*  CREATININE 6.54* 7.37*  CALCIUM 9.0 8.6*  MG  --  2.1   GFR: Estimated Creatinine Clearance: 7.8 mL/min (A) (by C-G formula based on SCr of 7.37 mg/dL (H)). Liver Function Tests: Recent Labs  Lab 11/03/21 0830 11/04/21 0500  AST 14* 12*  ALT 14 11  ALKPHOS 83 66  BILITOT 0.6 0.3  PROT 7.3 6.1*  ALBUMIN 3.3* 2.6*   No results for input(s): "LIPASE", "AMYLASE" in the last 168 hours. No results for input(s): "AMMONIA" in the last 168 hours. Coagulation Profile: Recent Labs  Lab 11/03/21 0830  INR 1.1   Cardiac Enzymes: No results for input(s): "CKTOTAL", "CKMB", "CKMBINDEX", "TROPONINI" in the last 168 hours. BNP (last 3 results) No results for input(s): "PROBNP" in the last 8760 hours. HbA1C: No results for input(s): "HGBA1C" in the last 72 hours. CBG: Recent Labs  Lab 11/03/21 1703 11/03/21 2140 11/04/21 0754  GLUCAP 215* 213* 95   Lipid Profile: No results for input(s): "CHOL", "HDL", "LDLCALC", "TRIG", "CHOLHDL", "LDLDIRECT" in the last 72 hours. Thyroid Function Tests: No results for input(s): "TSH", "T4TOTAL", "FREET4", "T3FREE", "THYROIDAB" in the last 72 hours. Anemia Panel: No results for input(s): "VITAMINB12", "FOLATE", "FERRITIN", "TIBC", "IRON", "RETICCTPCT" in the last 72 hours. Sepsis Labs: Recent Labs  Lab 11/03/21 0830 11/03/21 1030 11/03/21 1301 11/04/21 0500  PROCALCITON  --   --  0.68  --   LATICACIDVEN 2.2* 1.5  --  1.2    Recent Results (from the past 240 hour(s))  Blood Culture (routine x 2)      Status: None (Preliminary result)   Collection Time: 11/03/21  9:00 AM   Specimen: Blood  Result Value Ref Range Status   Specimen Description BLOOD RIGHT ARM  Final   Special Requests   Final    BOTTLES DRAWN AEROBIC AND ANAEROBIC Blood Culture adequate volume Performed at [facility], [address] [place], [place] [address]    Culture PENDING  Incomplete   Report Status PENDING  Incomplete  Blood Culture (routine x 2)     Status: None (Preliminary result)   Collection Time: 11/03/21  9:11 AM   Specimen: Blood  Result Value Ref Range Status   Specimen Description BLOOD RIGHT FOREARM  Final   Special Requests   Final    BOTTLES DRAWN AEROBIC AND ANAEROBIC Blood Culture results may not be optimal due to an excessive volume of blood received in culture bottles Performed at [facility], [address], [place], [place] [address]    Culture PENDING  Incomplete   Report Status PENDING  Incomplete  Resp Panel by RT-PCR (Flu A&B, Covid) Anterior Nasal Swab     Status: Abnormal   Collection Time: 11/03/21  9:19 AM   Specimen: Anterior Nasal Swab  Result Value Ref Range Status   SARS Coronavirus 2 by RT PCR POSITIVE (A) NEGATIVE Final    Comment: RESULT CALLED TO, READ BACK BY AND VERIFIED WITH: N COE RN 1210 [phone] K FORSYTH (NOTE) SARS-CoV-2 target nucleic acids are DETECTED.  The SARS-CoV-2 RNA is generally detectable in upper respiratory specimens during the acute phase of infection. Positive results are indicative of the presence of the identified virus, but do not rule out bacterial infection or co-infection with other pathogens not detected by the test. Clinical correlation with patient history and other diagnostic information is necessary to determine patient infection status. The expected result is Negative.  Fact Sheet for Patients: EntrepreneurPulse.com.au  Fact Sheet for Healthcare Providers: IncredibleEmployment.be  This test is  not yet approved or cleared by the [place] FDA and  has been authorized for detection and/or diagnosis of SARS-CoV-2 by FDA under an Emergency Use Authorization (EUA).  This EUA will remain in effect (meaning this test can be [place] ed) for the duration of  the COVID-19 declaration under Section 564(b)(1) of the Act, 21 U.S.C. section 360bbb-3(b)(1), unless the authorization is terminated or revoked sooner.     Influenza A by PCR NEGATIVE NEGATIVE Final   Influenza B by PCR NEGATIVE NEGATIVE Final    Comment: (NOTE) The Xpert Xpress SARS-CoV-2/FLU/RSV plus assay is intended as an aid in the diagnosis of influenza from Nasopharyngeal swab specimens and should not be used as a sole basis for treatment. Nasal washings and aspirates are unacceptable for Xpert Xpress SARS-CoV-2/FLU/RSV testing.  Fact Sheet for Patients: EntrepreneurPulse.com.au  Fact Sheet for Healthcare Providers: IncredibleEmployment.be  This test is not yet approved or cleared by the [place] FDA and has been authorized for detection and/or diagnosis of SARS-CoV-2 by FDA under an Emergency Use Authorization (EUA). This EUA will remain in effect (meaning this test can be used) for the duration of the COVID-19 declaration under Section 564(b)(1) of the Act, 21 U.S.C. section 360bbb-3(b)(1), unless the authorization is terminated or revoked.  Performed at [facility], [address] [place], [place] [address]   MRSA Next Gen by PCR, Nasal     Status: None   Collection Time: 11/04/21  9:01 AM   Specimen: Nasal Mucosa; Nasal Swab  Result Value Ref Range Status   MRSA by PCR Next Gen NOT DETECTED NOT DETECTED Final    Comment: (NOTE) The GeneXpert MRSA Assay (FDA approved for NASAL specimens only), is one component of a comprehensive MRSA colonization surveillance program. It is not intended to diagnose MRSA infection nor to guide or monitor treatment for MRSA  infections. Test performance is not FDA approved in patients less than 25 years old. Performed at [facility], [address], [place], [place] [address]          Radiology Studies: [facility] 1 View  Result Date: 11/03/2021 CLINICAL DATA:  Questionable sepsis EXAM: PORTABLE CHEST 1 VIEW COMPARISON:  12/11/2020 FINDINGS: The heart size and mediastinal contours are within normal limits. Large-bore right neck multi lumen vascular catheter. Both lungs are clear. The visualized skeletal structures are unremarkable. IMPRESSION: No acute abnormality of the lungs in AP portable projection. Electronically Signed   By: Cristie Hem  Cherly Beach M.D.   On: 11/03/2021 08:53        Scheduled Meds:  amLODipine  10 mg Oral Daily   calcitRIOL  0.5 mcg Oral Daily   calcitRIOL  1 mcg Oral Q T,Th,Sa-HD   Chlorhexidine Gluconate Cloth  6 each Topical Daily   Chlorhexidine Gluconate Cloth  6 each Topical Q0600   darbepoetin (ARANESP) injection - DIALYSIS  200 mcg Intravenous Q Tue-HD   feeding supplement (NEPRO CARB STEADY)  237 mL Oral BID BM   furosemide  40 mg Oral BID   gabapentin  100 mg Oral BID   hydrALAZINE  50 mg Oral TID   insulin aspart  0-15 Units Subcutaneous TID WC   insulin aspart  0-5 Units Subcutaneous QHS   insulin detemir  20 Units Subcutaneous Q2200   isosorbide mononitrate  30 mg Oral q morning   metoprolol succinate  50 mg Oral QPM   molnupiravir EUA  4 capsule Oral BID   rosuvastatin  10 mg Oral q morning   sevelamer carbonate  800 mg Oral TID WC   sodium bicarbonate  650 mg Oral TID   sodium chloride flush  3 mL Intravenous Q12H   Continuous Infusions:  sodium chloride       LOS: 1 day    Time spent: 35 minutes    Donyell Ding Darleen Crocker, DO [facility]  If 7PM-7AM, please contact night-coverage [email or web address] 11/04/2021, 11:36 AM

## 2021-11-04 NOTE — Progress Notes (Signed)
Initial Nutrition Assessment  DOCUMENTATION CODES:      INTERVENTION:  ProSource 30 ml TID (each 30 ml provides 100 kcal, 15 gr protein)   Renal / CHO modified diet  Renal vitamin  Recommend re-weight to verify severe loss   NUTRITION DIAGNOSIS:   Increased nutrient needs related to chronic illness (ESRD-HD) as evidenced by estimated needs.   GOAL:  Patient will meet greater than or equal to 90% of their needs   MONITOR:  PO intake, I & O's, Labs, Weight trends  REASON FOR ASSESSMENT:   Malnutrition Screening Tool    ASSESSMENT: Patient is a 61 yo female with a hx of DM2, depression, HTN, anxiety, substance abuse, Hepatitis C, Chronic CHF, ESRD on HD (MWF) and anemia of chronic disease. Patient is COVID positive.   HD planned for today per nephrology.   Patient ate 50% of breakfast and 75% of lunch. She is eating a cup of ice. Patient appetite is fair. Lives with her family. Home diet is regular.   EDW: unknown. Patient weight is down 11 kg compared to 08/14/21. Patient unable to recall usual body weight or EDW. Request re-weight.   Medications: calcitriol, lasix  CBG (last 3)  Recent Labs    11/03/21 2140 11/04/21 0754 11/04/21 1142  GLUCAP 213* 95 188*        Latest Ref Rng & Units 11/04/2021    5:00 AM 11/03/2021    8:30 AM 08/05/2021    9:24 AM  BMP  Glucose 70 - 99 mg/dL 96  372  593   BUN 6 - 20 mg/dL 45  37  25   Creatinine 0.44 - 1.00 mg/dL 7.37  6.54  4.20   Sodium 135 - 145 mmol/L 139  135  135   Potassium 3.5 - 5.1 mmol/L 4.2  4.2  3.9   Chloride 98 - 111 mmol/L 107  101  95   CO2 22 - 32 mmol/L 22  23    Calcium 8.9 - 10.3 mg/dL 8.6  9.0        NUTRITION - FOCUSED PHYSICAL EXAM: Unable to complete Nutrition-Focused physical exam at this time.    Diet Order:   Diet Order             Diet renal/carb modified with fluid restriction Diet-HS Snack? Nothing; Fluid restriction: 1200 mL Fluid; Room service appropriate? Yes; Fluid consistency:  Thin  Diet effective now                   EDUCATION NEEDS:  Education needs have been addressed  Skin:  Skin Assessment: Reviewed RN Assessment  Last BM:  8/14  Height:   Ht Readings from Last 1 Encounters:  11/03/21 5\' 6"  (1.676 m)    Weight:   Wt Readings from Last 1 Encounters:  11/03/21 65 kg    Ideal Body Weight:   59 kg  BMI:  Body mass index is 23.13 kg/m.  Estimated Nutritional Needs:   Kcal:  1900-2000  Protein:  78-85 gr  Fluid:  [place] MS,RD,CSG,LDN Contact: AMION

## 2021-11-04 NOTE — Consult Note (Signed)
Reason for Consult: To manage dialysis and dialysis related needs Referring Physician: [name] is an 62 y.o. female with past medical history significant for DM,  HTN, chronic diastolic heart failure, hep C, she also has ESRD-  HD MWF at [facility]-  last had dialysis on Friday 8/11.  She presented to the ER yesterday with fevers, chills and confusion.  Lab eval showed her to be covid positive, elevated lactic acid and hgb 8.7.  no evidence of pulmonary or urinary cause of fever.  She also has a TDC that was recently placed after failure of access.  Yesterday was dialysis day but she had no acute needs-  plans are for HD today off schedule    Dialyzes at [place]  MWF  4 hours   EDW 64.9. HD [place] 2/2.5, Dialyzer 180, Heparin yes. Access TDC  400 BFR  mircera 225  given 8/4 calcitriol 1 cg per tx   Past Medical History:  Diagnosis Date   Allergic rhinitis    Anxiety    CHF (congestive heart failure) ([place])    a. EF 30-35% by echo in 05/2020 b. EF at 45% in 09/2020   Chronic back pain    Chronic hepatitis C without hepatic coma ([place])    COVID-19 virus infection 12/26/2019   Depression    Diabetes mellitus    Hepatitis C    Hypertension    Noncompliance    Poor appetite 07/17/2014   Substance abuse ([place])    HX of drug use and alcohol use    Past Surgical History:  Procedure Laterality Date   APPENDECTOMY  2004   AV FISTULA PLACEMENT Left 11/26/2020   Procedure: LEFT ARM ARTERIOVENOUS (AV) FISTULA CREATION;  Surgeon: [name], MD;  Location: [facility];  Service: Vascular;  Laterality: Left;   AV FISTULA PLACEMENT Left 06/03/2021   Procedure: INSERTION OF LEFT UPPER ARM ARTERIOVENOUS (AV) GORE-TEX GRAFT;  Surgeon: [name], MD;  Location: [facility];  Service: Vascular;  Laterality: Left;   IR FLUORO GUIDE CV LINE RIGHT  12/25/2020   IR US GUIDE VASC ACCESS RIGHT  12/25/2020   LIGATION ARTERIOVENOUS GORTEX GRAFT Left 08/05/2021   Procedure: LIGATION OF LEFT  ARM ARTERIOVENOUS GORTEX GRAFT;  Surgeon: [name], MD;  Location: [facility];  Service: Vascular;  Laterality: Left;    Family History  Problem Relation Age of Onset   Diabetes Sister        Twin - AIDS     Social History:  reports that she has been smoking cigarettes. She has been smoking an average of .25 packs per day. She has never used smokeless tobacco. She reports that she does not currently use alcohol after a past usage of about 40.0 standard drinks of alcohol per week. She reports that she does not currently use drugs after having used the following drugs: Marijuana and Cocaine.  Allergies:  Allergies  Allergen Reactions   Penicillins Itching and Rash    Medications: I have reviewed the patient's current medications.  Results for orders placed or performed during the hospital encounter of 11/03/21 (from the past 48 hour(s))  Lactic acid, plasma     Status: Abnormal   Collection Time: 11/03/21  8:30 AM  Result Value Ref Range   Lactic Acid, Venous 2.2 (HH) 0.5 - 1.9 mmol/L    Comment: CRITICAL RESULT CALLED TO, READ BACK BY AND VERIFIED WITH: Idelle Crouch @ 0940 ON 11/03/21 C VARNER Performed at  [facility] [place], [place] [address]   Comprehensive metabolic panel     Status: Abnormal   Collection Time: 11/03/21  8:30 AM  Result Value Ref Range   Sodium 135 135 - 145 mmol/L   Potassium 4.2 3.5 - 5.1 mmol/L   Chloride 101 98 - 111 mmol/L   CO2 23 22 - 32 mmol/L   Glucose, Bld 372 (H) 70 - 99 mg/dL    Comment: Glucose reference range applies only to samples taken after fasting for at least 8 hours.   BUN 37 (H) 6 - 20 mg/dL   Creatinine, Ser 6.54 (H) 0.44 - 1.00 mg/dL   Calcium 9.0 8.9 - 10.3 mg/dL   Total Protein 7.3 6.5 - 8.1 g/dL   Albumin 3.3 (L) 3.5 - 5.0 g/dL   AST 14 (L) 15 - 41 U/L   ALT 14 0 - 44 U/L   Alkaline Phosphatase 83 38 - 126 U/L   Total Bilirubin 0.6 0.3 - 1.2 mg/dL   GFR, Estimated 7 (L) >60 mL/min    Comment:  (NOTE) Calculated using the CKD-EPI Creatinine Equation (2021)    Anion gap 11 5 - 15    Comment: Performed at [facility], [address], [place], [place] [address]  CBC with Differential     Status: Abnormal   Collection Time: 11/03/21  8:30 AM  Result Value Ref Range   WBC 9.6 4.0 - 10.5 K/uL   RBC 4.32 3.87 - 5.11 MIL/uL   Hemoglobin 8.7 (L) 12.0 - 15.0 g/dL    Comment: Reticulocyte Hemoglobin testing may be clinically indicated, consider ordering this additional test [id number]    HCT 31.7 (L) 36.0 - 46.0 %   MCV 73.4 (L) 80.0 - 100.0 fL   MCH 20.1 (L) 26.0 - 34.0 pg   MCHC 27.4 (L) 30.0 - 36.0 g/dL   RDW 17.6 (H) 11.5 - 15.5 %   Platelets 215 150 - 400 K/uL   nRBC 0.4 (H) 0.0 - 0.2 %   Neutrophils Relative % 82 %   Neutro Abs 7.9 (H) 1.7 - 7.7 K/uL   Lymphocytes Relative 8 %   Lymphs Abs 0.8 0.7 - 4.0 K/uL   Monocytes Relative 7 %   Monocytes Absolute 0.7 0.1 - 1.0 K/uL   Eosinophils Relative 1 %   Eosinophils Absolute 0.1 0.0 - 0.5 K/uL   Basophils Relative 1 %   Basophils Absolute 0.1 0.0 - 0.1 K/uL   Immature Granulocytes 1 %   Abs Immature Granulocytes 0.06 0.00 - 0.07 K/uL    Comment: Performed at [facility], [address], [place], [place] [address]  Protime-INR     Status: None   Collection Time: 11/03/21  8:30 AM  Result Value Ref Range   Prothrombin Time 14.0 11.4 - 15.2 seconds   INR 1.1 0.8 - 1.2    Comment: (NOTE) INR goal varies based on device and disease states. Performed at [facility], [address] [place], [place] [address]   APTT     Status: Abnormal   Collection Time: 11/03/21  8:30 AM  Result Value Ref Range   aPTT 23 (L) 24 - 36 seconds    Comment: Performed at [facility], [address] [place], [place] [address]  Blood Culture (routine x 2)     Status: None (Preliminary result)   Collection Time: 11/03/21  9:00 AM   Specimen: Blood  Result Value Ref Range   Specimen Description BLOOD RIGHT  ARM    Special Requests      BOTTLES  DRAWN AEROBIC AND ANAEROBIC Blood Culture adequate volume Performed at [facility], [address] [place], [place] [address]    Culture PENDING    Report Status PENDING   Blood Culture (routine x 2)     Status: None (Preliminary result)   Collection Time: 11/03/21  9:11 AM   Specimen: Blood  Result Value Ref Range   Specimen Description BLOOD RIGHT FOREARM    Special Requests      BOTTLES DRAWN AEROBIC AND ANAEROBIC Blood Culture results may not be optimal due to an excessive volume of blood received in culture bottles Performed at [facility], [address], [place], [place] [address]    Culture PENDING    Report Status PENDING   Resp Panel by RT-PCR (Flu A&B, Covid) Anterior Nasal Swab     Status: Abnormal   Collection Time: 11/03/21  9:19 AM   Specimen: Anterior Nasal Swab  Result Value Ref Range   SARS Coronavirus 2 by RT PCR POSITIVE (A) NEGATIVE    Comment: RESULT CALLED TO, READ BACK BY AND VERIFIED WITH: N COE RN 1210 [phone] K FORSYTH (NOTE) SARS-CoV-2 target nucleic acids are DETECTED.  The SARS-CoV-2 RNA is generally detectable in upper respiratory specimens during the acute phase of infection. Positive results are indicative of the presence of the identified virus, but do not rule out bacterial infection or co-infection with other pathogens not detected by the test. Clinical correlation with patient history and other diagnostic information is necessary to determine patient infection status. The expected result is Negative.  Fact Sheet for Patients: EntrepreneurPulse.com.au  Fact Sheet for Healthcare Providers: IncredibleEmployment.be  This test is not yet approved or cleared by the [place] FDA and  has been authorized for detection and/or diagnosis of SARS-CoV-2 by FDA under an Emergency Use Authorization (EUA).  This EUA will remain in effect (meaning this test can be [place] ed) for the duration of  the COVID-19  declaration under Section 564(b)(1) of the Act, 21 U.S.C. section 360bbb-3(b)(1), unless the authorization is terminated or revoked sooner.     Influenza A by PCR NEGATIVE NEGATIVE   Influenza B by PCR NEGATIVE NEGATIVE    Comment: (NOTE) The Xpert Xpress SARS-CoV-2/FLU/RSV plus assay is intended as an aid in the diagnosis of influenza from Nasopharyngeal swab specimens and should not be used as a sole basis for treatment. Nasal washings and aspirates are unacceptable for Xpert Xpress SARS-CoV-2/FLU/RSV testing.  Fact Sheet for Patients: EntrepreneurPulse.com.au  Fact Sheet for Healthcare Providers: IncredibleEmployment.be  This test is not yet approved or cleared by the [place] FDA and has been authorized for detection and/or diagnosis of SARS-CoV-2 by FDA under an Emergency Use Authorization (EUA). This EUA will remain in effect (meaning this test can be used) for the duration of the COVID-19 declaration under Section 564(b)(1) of the Act, 21 U.S.C. section 360bbb-3(b)(1), unless the authorization is terminated or revoked.  Performed at [facility], [address], [place], [place] [address]   Urinalysis, Routine w reflex microscopic Urine, In & Out Cath     Status: Abnormal   Collection Time: 11/03/21 10:10 AM  Result Value Ref Range   Color, Urine YELLOW YELLOW   APPearance CLEAR CLEAR   Specific Gravity, Urine 1.012 1.005 - 1.030   pH 7.0 5.0 - 8.0   Glucose, UA >=500 (A) NEGATIVE mg/dL   Hgb urine dipstick NEGATIVE NEGATIVE   Bilirubin Urine NEGATIVE  NEGATIVE   Ketones, ur NEGATIVE NEGATIVE mg/dL   Protein, ur >=300 (A) NEGATIVE mg/dL   Nitrite NEGATIVE NEGATIVE   Leukocytes,Ua NEGATIVE NEGATIVE   RBC / HPF 0-5 0 - 5 RBC/hpf   WBC, UA 0-5 0 - 5 WBC/hpf   Bacteria, UA NONE SEEN NONE SEEN   Squamous Epithelial / LPF 0-5 0 - 5    Comment: Performed at [facility], [address] [place], [place] [address]  Lactic acid,  plasma     Status: None   Collection Time: 11/03/21 10:30 AM  Result Value Ref Range   Lactic Acid, Venous 1.5 0.5 - 1.9 mmol/L    Comment: Performed at [facility], [address] [place], [place] [address]  HIV Antibody (routine testing w rflx)     Status: None   Collection Time: 11/03/21  1:01 PM  Result Value Ref Range   HIV Screen 4th Generation wRfx Non Reactive Non Reactive    Comment: Performed at [facility], [place] [address] [place], [place] [address]  Procalcitonin     Status: None   Collection Time: 11/03/21  1:01 PM  Result Value Ref Range   Procalcitonin 0.68 ng/mL    Comment:        Interpretation: PCT > 0.5 ng/mL and <= 2 ng/mL: Systemic infection (sepsis) is possible, but other conditions are known to elevate PCT as well. (NOTE)       Sepsis PCT Algorithm           Lower Respiratory Tract                                      Infection PCT Algorithm    ----------------------------     ----------------------------         PCT < 0.25 ng/mL                PCT < 0.10 ng/mL          Strongly encourage             Strongly discourage   discontinuation of antibiotics    initiation of antibiotics    ----------------------------     -----------------------------       PCT 0.25 - 0.50 ng/mL            PCT 0.10 - 0.25 ng/mL               OR       >80% decrease in PCT            Discourage initiation of                                            antibiotics      Encourage discontinuation           of antibiotics    ----------------------------     -----------------------------         PCT >= 0.50 ng/mL              PCT 0.26 - 0.50 ng/mL                AND       <80% decrease in PCT             Encourage initiation of  antibiotics       Encourage continuation           of antibiotics    ----------------------------     -----------------------------        PCT >= 0.50 ng/mL                  PCT > 0.50 ng/mL                AND         increase in PCT                  Strongly encourage                                      initiation of antibiotics    Strongly encourage escalation           of antibiotics                                     -----------------------------                                           PCT <= 0.25 ng/mL                                                 OR                                        > 80% decrease in PCT                                      Discontinue / Do not initiate                                             antibiotics  Performed at [facility], [address], [place], [place] [address]   Glucose, capillary     Status: Abnormal   Collection Time: 11/03/21  5:03 PM  Result Value Ref Range   Glucose-Capillary 215 (H) 70 - 99 mg/dL    Comment: Glucose reference range applies only to samples taken after fasting for at least 8 hours.  Glucose, capillary     Status: Abnormal   Collection Time: 11/03/21  9:40 PM  Result Value Ref Range   Glucose-Capillary 213 (H) 70 - 99 mg/dL    Comment: Glucose reference range applies only to samples taken after fasting for at least 8 hours.    DG Chest Port 1 View  Result Date: 11/03/2021 CLINICAL DATA:  Questionable sepsis EXAM: PORTABLE CHEST 1 VIEW COMPARISON:  12/11/2020 FINDINGS: The heart size and mediastinal contours are within normal limits. Large-bore right neck multi lumen vascular catheter. Both lungs are clear. The visualized skeletal structures are unremarkable. IMPRESSION: No acute abnormality of  the lungs in AP portable projection. Electronically Signed   By: Delanna Ahmadi M.D.   On: 11/03/2021 08:53    ROS: some SOB but says overall she feels better Blood pressure (!) 163/84, pulse (!) 101, temperature 99.8 F (37.7 C), resp. rate 20, height 5\' 6"  (1.676 m), weight 65 kg, last menstrual period 06/23/2010, SpO2 98 %. General appearance: alert, cooperative, mild distress, and due to SOB but is eating breakfast  successfully  Resp: diminished breath sounds bilaterally Cardio: regular rate and rhythm, S1, S2 normal, no murmur, click, rub or gallop GI: soft, non-tender; bowel sounds normal; no masses,  no organomegaly Extremities: extremities normal, atraumatic, no cyanosis or edema Right TDC-  clean and dry-  non tender  Assessment/Plan: 61 year old BF with multiple medical issues including ESRD-  presents with symptomatic covid infection  1 fever/chills- appears to be due to covid-  on aztreonam, vanc, flagyl and also moinupiravir.  Clinically improved 2 ESRD: normally MWF-  were not able to complete HD yesterday here due to high ESRD pt volume and no immediate needs-  plan is for HD today off schedule via TDC 3 Hypertension: variable BP but mostly high-  on amlodipine, toprol, hydralazine and also lasix but appears to be making urine-  does not appear too volume overloaded -  is actually at EDW-  most pts lose weight with covid so will challenge some with HD today  4. Anemia of ESRD: hgb 7.9-  has been in the 8's as OP-  due for ESA will dose-  transfuse for under 7-  is normally on heparin with HD but will hold for now given anemia  5. Metabolic Bone Disease: will continue calcitriol and renvela 6.  Acidosis-  is on bicarb as OP-  will continue  Louis Meckel 11/04/2021, 12:18 AM

## 2021-11-04 NOTE — Procedures (Signed)
   HEMODIALYSIS TREATMENT NOTE:   3.5 hour heparin-free treatment completed bedside, uneventfully, using RIJ TDC.  Cath entry site is unremarkable.  Increasingly hypertensive: goal challenge met: 2.3 liters removed without interruption in UF.  Darbepoetin 200 mcg given.  All blood was returned. No changes from pre-HD assessment.  Hand-off given to Rosary Lively, RN.   Rockwell Alexandria, RN

## 2021-11-04 NOTE — TOC Initial Note (Addendum)
Transition of Care [facility]) - Initial/Assessment Note    Patient Details  Name: Latoya Walsh MRN: [id number] Date of Birth: [birth year]  [facility] CM/SW Contact:    Iona Beard, [place] Phone Number: 11/04/2021, 12:22 PM  Clinical Narrative:                 Pt is high risk for readmission. CSW completed chart review and notes that pt lives with her family. Pt has outpatient HD MWF. CSW spoke to manager with [facility] to update on COVID status. Pt will be able to return to outpatient HD as normal once D/C from hospital. TOC to continue following for any D/C needs.   Expected Discharge Plan: Home/Self Care Barriers to Discharge: Continued Medical Work up   Patient Goals and CMS Choice Patient states their goals for this hospitalization and ongoing recovery are:: return home CMS Medicare.gov Compare Post Acute Care list provided to:: Patient Choice offered to / list presented to : Patient  Expected Discharge Plan and Services Expected Discharge Plan: Home/Self Care In-house Referral: Clinical Social Work Discharge Planning Services: CM Consult   Living arrangements for the past 2 months: Single Family Home                                      Prior Living Arrangements/Services Living arrangements for the past 2 months: Single Family Home   Patient language and need for interpreter reviewed:: Yes Do you feel safe going back to the place where you live?: Yes      Need for Family Participation in Patient Care: Yes (Comment) Care giver support system in place?: Yes (comment)   Criminal Activity/Legal Involvement Pertinent to Current Situation/Hospitalization: No - Comment as needed  Activities of Daily Living Home Assistive Devices/Equipment: None ADL Screening (condition at time of admission) Patient's cognitive ability adequate to safely complete daily activities?: Yes Is the patient deaf or have difficulty hearing?: No Does the patient  have difficulty seeing, even when wearing glasses/contacts?: No Does the patient have difficulty concentrating, remembering, or making decisions?: No Patient able to express need for assistance with ADLs?: Yes Does the patient have difficulty dressing or bathing?: No Independently performs ADLs?: Yes (appropriate for developmental age) Does the patient have difficulty walking or climbing stairs?: No Weakness of Legs: Both Weakness of Arms/Hands: None  Permission Sought/Granted                  Emotional Assessment         Alcohol / Substance Use: Not Applicable Psych Involvement: No (comment)  Admission diagnosis:  Febrile illness [R50.9] Sepsis ([place]) [A41.9] Patient Active Problem List   Diagnosis Date Noted   Sepsis ([place]) 11/03/2021   ESRD (end stage renal disease) ([place]) 01/06/2021   CHF (congestive heart failure) ([place]) 06/06/2020   Leg swelling 04/28/2020   Hyperlipidemia LDL goal <100 04/28/2020   Left hand pain 03/10/2020   Anemia 02/19/2020   COVID-19 virus infection 12/26/2019   Depression, major, single episode, in partial remission ([place]) 01/25/2019   Headache 11/13/2018   Insomnia 09/25/2018   Screening for colorectal cancer 07/23/2016   Lumbar back pain with radiculopathy affecting right lower extremity 03/27/2015   Non compliance with medical treatment 07/22/2014   Chronic hepatitis C without hepatic coma ([place]) 12/04/2013   Allergic rhinitis 11/03/2012   Severe hypertension 06/25/2011   Anxiety and depression 02/05/2011  Nicotine dependence 11/01/2010   Dermatitis 10/30/2010   Drug dependence, continuous abuse ([place]) 03/22/2010   Uncontrolled type 2 diabetes mellitus with hyperglycemia ([place]) 06/02/2007   Alcohol abuse 06/02/2007   PCP:  Pcp, No Pharmacy:   [facility] [address] [place] [address] Phone: [phone] Fax: [phone]  [facility] - [place], [place] - [address] [place] [address] Phone: [phone] Fax: [phone]  [facility], [place] [address] Phone: [phone] Fax: [phone]     Social Determinants of Health (SDOH) Interventions    Readmission Risk Interventions    11/04/2021   12:21 PM 06/09/2020    1:09 PM  Readmission Risk Prevention Plan  Transportation Screening Complete Complete  HRI or Home Care Consult Complete   Social Work Consult for [place] Planning/Counseling Complete   Palliative Care Screening Not Applicable   Medication Review Press photographer) Complete Complete  PCP or Specialist appointment within 3-5 days of discharge  Complete  HRI or [place]  Complete  SW Recovery Care/Counseling Consult  Complete  [place]  Not Applicable

## 2021-11-05 ENCOUNTER — Other Ambulatory Visit (HOSPITAL_COMMUNITY): Payer: Self-pay

## 2021-11-05 DIAGNOSIS — U071 COVID-19: Secondary | ICD-10-CM | POA: Diagnosis not present

## 2021-11-05 LAB — COMPREHENSIVE METABOLIC PANEL
ALT: 31 U/L (ref 0–44)
AST: 289 U/L — ABNORMAL HIGH (ref 15–41)
Albumin: 2.5 g/dL — ABNORMAL LOW (ref 3.5–5.0)
Alkaline Phosphatase: 134 U/L — ABNORMAL HIGH (ref 38–126)
Anion gap: 10 (ref 5–15)
BUN: 37 mg/dL — ABNORMAL HIGH (ref 6–20)
CO2: 25 mmol/L (ref 22–32)
Calcium: 8.2 mg/dL — ABNORMAL LOW (ref 8.9–10.3)
Chloride: 99 mmol/L (ref 98–111)
Creatinine, Ser: 5.2 mg/dL — ABNORMAL HIGH (ref 0.44–1.00)
GFR, Estimated: 9 mL/min — ABNORMAL LOW (ref >60)
Glucose, Bld: 160 mg/dL — ABNORMAL HIGH (ref 70–99)
Potassium: 3.8 mmol/L (ref 3.5–5.1)
Sodium: 134 mmol/L — ABNORMAL LOW (ref 135–145)
Total Bilirubin: 3 mg/dL — ABNORMAL HIGH (ref 0.3–1.2)
Total Protein: 5.9 g/dL — ABNORMAL LOW (ref 6.5–8.1)

## 2021-11-05 LAB — CBC WITH DIFFERENTIAL/PLATELET
Abs Immature Granulocytes: 0.03 10*3/uL (ref 0.00–0.07)
Basophils Absolute: 0 10*3/uL (ref 0.0–0.1)
Basophils Relative: 0 %
Eosinophils Absolute: 0.1 10*3/uL (ref 0.0–0.5)
Eosinophils Relative: 1 %
HCT: 27.9 % — ABNORMAL LOW (ref 36.0–46.0)
Hemoglobin: 8.2 g/dL — ABNORMAL LOW (ref 12.0–15.0)
Immature Granulocytes: 0 %
Lymphocytes Relative: 24 %
Lymphs Abs: 2.1 10*3/uL (ref 0.7–4.0)
MCH: 21.2 pg — ABNORMAL LOW (ref 26.0–34.0)
MCHC: 29.4 g/dL — ABNORMAL LOW (ref 30.0–36.0)
MCV: 72.1 fL — ABNORMAL LOW (ref 80.0–100.0)
Monocytes Absolute: 0.8 10*3/uL (ref 0.1–1.0)
Monocytes Relative: 10 %
Neutro Abs: 5.6 10*3/uL (ref 1.7–7.7)
Neutrophils Relative %: 65 %
Platelets: 137 10*3/uL — ABNORMAL LOW (ref 150–400)
RBC: 3.87 MIL/uL (ref 3.87–5.11)
RDW: 17.9 % — ABNORMAL HIGH (ref 11.5–15.5)
WBC: 8.6 10*3/uL (ref 4.0–10.5)
nRBC: 0 % (ref 0.0–0.2)

## 2021-11-05 LAB — URINE CULTURE: Culture: NO GROWTH

## 2021-11-05 LAB — HEPATITIS C ANTIBODY: HCV Ab: REACTIVE — AB

## 2021-11-05 LAB — HEPATITIS B SURFACE ANTIGEN: Hepatitis B Surface Ag: NONREACTIVE

## 2021-11-05 LAB — HEPATITIS B SURFACE ANTIBODY,QUALITATIVE: Hep B S Ab: REACTIVE — AB

## 2021-11-05 LAB — C-REACTIVE PROTEIN: CRP: 2.8 mg/dL — ABNORMAL HIGH (ref <1.0)

## 2021-11-05 LAB — HEPATITIS B CORE ANTIBODY, TOTAL: Hep B Core Total Ab: NONREACTIVE

## 2021-11-05 LAB — D-DIMER, QUANTITATIVE: D-Dimer, Quant: >20 ug/mL-FEU — ABNORMAL HIGH (ref 0.00–0.50)

## 2021-11-05 LAB — GLUCOSE, CAPILLARY
Glucose-Capillary: 159 mg/dL — ABNORMAL HIGH (ref 70–99)
Glucose-Capillary: 280 mg/dL — ABNORMAL HIGH (ref 70–99)
Glucose-Capillary: 115 mg/dL — ABNORMAL HIGH (ref 70–99)

## 2021-11-05 LAB — MAGNESIUM: Magnesium: 2 mg/dL (ref 1.7–2.4)

## 2021-11-05 MED ORDER — MOLNUPIRAVIR EUA 200MG CAPSULE
4.0000 | ORAL_CAPSULE | Freq: Two times a day (BID) | ORAL | 0 refills | Status: AC
Start: 2021-11-05 — End: 2021-11-09

## 2021-11-05 MED ORDER — MOLNUPIRAVIR EUA 200MG CAPSULE
4.0000 | ORAL_CAPSULE | Freq: Two times a day (BID) | ORAL | 0 refills | Status: DC
  Filled 2021-11-05: qty 32, 4d supply, fill #0
  Filled 2021-11-05: qty 40, 4d supply, fill #0

## 2021-11-05 MED ORDER — NEPRO/CARBSTEADY PO LIQD: 237.0000 mL | Freq: Two times a day (BID) | ORAL | 0 refills | Status: AC

## 2021-11-05 MED ORDER — GUAIFENESIN-DM 100-10 MG/5ML PO SYRP
10.0000 mL | ORAL_SOLUTION | ORAL | 0 refills | Status: DC | PRN
  Filled 2021-11-05: qty 118, 2d supply, fill #0

## 2021-11-05 MED ORDER — CHLORHEXIDINE GLUCONATE CLOTH 2 % EX PADS: 6.0000 | MEDICATED_PAD | Freq: Every day | CUTANEOUS | Status: DC

## 2021-11-05 MED ORDER — POLYETHYLENE GLYCOL 3350 17 G PO PACK
17.0000 g | PACK | Freq: Every day | ORAL | Status: DC
  Administered 2021-11-05: 17 g via ORAL
  Filled 2021-11-05: qty 1

## 2021-11-05 MED ORDER — GUAIFENESIN-DM 100-10 MG/5ML PO SYRP: 10.0000 mL | ORAL_SOLUTION | ORAL | 0 refills | Status: DC | PRN

## 2021-11-05 MED ORDER — NEPRO/CARBSTEADY PO LIQD
237.0000 mL | Freq: Two times a day (BID) | ORAL | 0 refills | Status: DC
  Filled 2021-11-05: qty 14220, 30d supply, fill #0

## 2021-11-05 MED ORDER — ALBUTEROL SULFATE HFA 108 (90 BASE) MCG/ACT IN AERS
2.0000 | INHALATION_SPRAY | RESPIRATORY_TRACT | 1 refills | Status: DC | PRN
  Filled 2021-11-05: qty 18, 16d supply, fill #0

## 2021-11-05 MED ORDER — ALBUTEROL SULFATE HFA 108 (90 BASE) MCG/ACT IN AERS: 2.0000 | INHALATION_SPRAY | RESPIRATORY_TRACT | 1 refills | Status: DC | PRN

## 2021-11-05 NOTE — Progress Notes (Signed)
PT Cancellation Note  Patient Details Name: Latoya Walsh MRN: [id number] DOB: [birth year]   Cancelled Treatment:    Reason Eval/Treat Not Completed: Patient declined, no reason specified.  Patient declined therapy secondary to c/o fatigue even after much encouragement. Patient states she has been walking in the room by herself - confirmed by nursing staff.   12:19 PM, 11/05/21 Lonell Grandchild, MPT Physical Therapist with [facility] [phone] office [phone] mobile phone

## 2021-11-05 NOTE — Progress Notes (Signed)
Subjective:  HD yesterday -  removed 2.5 liters- tolerated fine-  post weight 66.3  ( EDW 64.9)  tmax 99.2-  is cranky this AM-  hungry but starting to feel the covid-  coughing- ST-  thinks she still has fluid on-  not ready to go home  Objective Vital signs in last 24 hours: Vitals:   11/04/21 2030 11/04/21 2040 11/05/21 0500 11/05/21 0531  BP: (!) 158/91 (!) 169/79  (!) 152/75  Pulse: 98 (!) 110  83  Resp: 18 17  18   Temp:  99.1 F (37.3 C)  98.1 F (36.7 C)  TempSrc:  Oral  Oral  SpO2:  98%  100%  Weight:  66.3 kg 65.3 kg   Height:       Weight change: 3.5 kg  Intake/Output Summary (Last 24 hours) at 11/05/2021 0719 Last data filed at 11/04/2021 2040 Gross per 24 hour  Intake 600 ml  Output 202.3 ml  Net 397.7 ml   Dialyzes at [place]  MWF  4 hours   EDW 64.9. HD Bath 2/2.5, Dialyzer 180, Heparin yes. Access TDC  400 BFR  mircera 225  given 8/4 calcitriol 1 cg per tx    Assessment/Plan: 61 year old BF with multiple medical issues including ESRD-  presents with symptomatic covid infection  1 fever/chills- appears to be due to covid-  on aztreonam, vanc, flagyl and also moinupiravir.  Clinically improved-  now antibiotics have been stopped-  just on moinupiravir-  interestingly mlore symptomatic today  2 ESRD: normally MWF-   HD done off schedule 8/15 via [facility]-  will plan for next tomorrow if still here-  if is ready to go home could just present for her regular HD on Friday-  she does not feel like she is ready to leave 3 Hypertension: variable BP but mostly high-  on amlodipine, toprol, hydralazine and also lasix but appears to be making urine-  does not appear too volume overloaded -  is actually at EDW this AM-  tolerated medium goal with HD yest-  usually pts lose weight with covid-  will challenge with HD tomorrow 4. Anemia of ESRD: hgb 7.9-  has been in the 8's as OP-  due for ESA will dose-  transfuse for under 7-  is normally on heparin with HD but will hold for now  given anemia  5. Metabolic Bone Disease: will continue calcitriol and renvela 6.  Acidosis-  is on bicarb as OP-  will continue   [place]: Basic Metabolic Panel: Recent Labs  Lab 11/03/21 0830 11/04/21 0500  NA 135 139  K 4.2 4.2  CL 101 107  CO2 23 22  GLUCOSE 372* 96  BUN 37* 45*  CREATININE 6.54* 7.37*  CALCIUM 9.0 8.6*   Liver Function Tests: Recent Labs  Lab 11/03/21 0830 11/04/21 0500  AST 14* 12*  ALT 14 11  ALKPHOS 83 66  BILITOT 0.6 0.3  PROT 7.3 6.1*  ALBUMIN 3.3* 2.6*   No results for input(s): "LIPASE", "AMYLASE" in the last 168 hours. No results for input(s): "AMMONIA" in the last 168 hours. CBC: Recent Labs  Lab 11/03/21 0830 11/04/21 0500  WBC 9.6 7.1  NEUTROABS 7.9* 4.9  HGB 8.7* 7.9*  HCT 31.7* 29.2*  MCV 73.4* 74.1*  PLT 215 183   Cardiac Enzymes: No results for input(s): "CKTOTAL", "CKMB", "CKMBINDEX", "TROPONINI" in the last 168 hours. CBG: Recent Labs  Lab 11/03/21 2140 11/04/21 0754 11/04/21  1142 11/04/21 1649 11/04/21 2149  GLUCAP 213* 95 188* 131* 198*    Iron Studies: No results for input(s): "IRON", "TIBC", "TRANSFERRIN", "FERRITIN" in the last 72 hours. Studies/Results: DG Chest Port 1 View  Result Date: 11/03/2021 CLINICAL DATA:  Questionable sepsis EXAM: PORTABLE CHEST 1 VIEW COMPARISON:  12/11/2020 FINDINGS: The heart size and mediastinal contours are within normal limits. Large-bore right neck multi lumen vascular catheter. Both lungs are clear. The visualized skeletal structures are unremarkable. IMPRESSION: No acute abnormality of the lungs in AP portable projection. Electronically Signed   By: Delanna Ahmadi M.D.   On: 11/03/2021 08:53   Medications: Infusions:  sodium chloride      Scheduled Medications:  (feeding supplement) PROSource Plus  30 mL Oral BID BM   amLODipine  10 mg Oral Daily   calcitRIOL  0.5 mcg Oral Daily   calcitRIOL  1 mcg Oral Q T,Th,Sa-HD   Chlorhexidine Gluconate  Cloth  6 each Topical Daily   Chlorhexidine Gluconate Cloth  6 each Topical Q0600   darbepoetin (ARANESP) injection - DIALYSIS  200 mcg Intravenous Q Tue-HD   feeding supplement (NEPRO CARB STEADY)  237 mL Oral BID BM   furosemide  40 mg Oral BID   gabapentin  100 mg Oral BID   hydrALAZINE  50 mg Oral TID   insulin aspart  0-15 Units Subcutaneous TID WC   insulin aspart  0-5 Units Subcutaneous QHS   insulin detemir  20 Units Subcutaneous Q2200   isosorbide mononitrate  30 mg Oral q morning   metoprolol succinate  50 mg Oral QPM   molnupiravir EUA  4 capsule Oral BID   multivitamin  1 tablet Oral QHS   rosuvastatin  10 mg Oral q morning   sevelamer carbonate  800 mg Oral TID WC   sodium bicarbonate  650 mg Oral TID   sodium chloride flush  3 mL Intravenous Q12H    have reviewed scheduled and prn medications.  Physical Exam: General: cranky today -   NAD Heart: RRR Lungs: some rhonchi Abdomen: soft, non tender Extremities: no peripheral edema Dialysis Access: right TDC    11/05/2021,7:19 AM  LOS: 2 days

## 2021-11-05 NOTE — Discharge Summary (Addendum)
Physician Discharge Summary  Latoya Walsh QMG:[id number] DOB: 04-Oct-1960 DOA: 11/03/2021  PCP: Pcp, No  Admit date: 11/03/2021  Discharge date: 11/05/2021  Admitted From:Home  Disposition:  Home  Recommendations for Outpatient Follow-up:  Follow up with PCP in 1-2 weeks Continue on molnupiravir for 4 more days as prescribed to complete total 5-day course of treatment Continue other home medications as prior Continue routine hemodialysis MWF  Home Health: None  Equipment/Devices: None  Discharge Condition:Stable  CODE STATUS: Full  Diet recommendation: Heart Healthy/carb modified  Brief/Interim Summary:  Latoya Walsh is a 61 y.o. female with medical history significant for chronic diastolic CHF, chronic hepatitis C, hypertension, dyslipidemia, type 2 diabetes, and ESRD on HD MWF who was not "acting right" according to family members before trying to go to dialysis this morning.  She was noted to have fevers and chills and has been diagnosed with COVID infection and has been started on molnupiravir.  She has remained somewhat cranky throughout the course of this admission, but is otherwise stable for discharge with no further fever noted while being treated.  She has not had any oxygen requirement and has worked with physical therapy with no significant weakness or other concerns noted.  She has undergone hemodialysis on 8/15 with no acute events or concerns.  Discharge Diagnoses:  Principal Problem:   COVID-19 virus infection Active Problems:   Uncontrolled type 2 diabetes mellitus with hyperglycemia (HCC)   Anxiety and depression   Chronic hepatitis C without hepatic coma (HCC)   CHF (congestive heart failure) (HCC)   ESRD (end stage renal disease) ([place])  Principal discharge diagnosis: Acute metabolic encephalopathy secondary to COVID infection-resolved.  ESRD on HD.  Discharge Instructions  Discharge Instructions     Diet - low sodium heart healthy   Complete by:  As directed    Increase activity slowly   Complete by: As directed    No wound care   Complete by: As directed       Allergies as of 11/05/2021       Reactions   Penicillins Itching, Rash        Medication List     TAKE these medications    Accu-Chek Guide test strip Generic drug: glucose blood Use to check glucose twice daily   Accu-Chek Softclix Lancets lancets 2 (two) times daily.   albuterol 108 (90 Base) MCG/ACT inhaler Commonly known as: VENTOLIN HFA Inhale 2 puffs into the lungs every 4 (four) hours as needed for wheezing or shortness of breath.   amLODipine 10 MG tablet Commonly known as: NORVASC Take 10 mg by mouth daily.   betamethasone dipropionate 0.05 % cream Apply topically 2 (two) times daily.   blood glucose meter kit and supplies Dispense based on patient and insurance preference. Use to monitor glucose twice daily.   calcitRIOL 0.25 MCG capsule Commonly known as: ROCALTROL Take 0.5 mcg by mouth daily.   feeding supplement (NEPRO CARB STEADY) Liqd Take 237 mLs by mouth 2 (two) times daily between meals.   furosemide 80 MG tablet Commonly known as: LASIX Take 80 mg  (1 tablet) in the morning and 40 mg (half tablet) in the afternoon.   gabapentin 100 MG capsule Commonly known as: NEURONTIN TAKE ONE CAPSULE BY MOUTH EVERY MORNING and TAKE ONE CAPSULE BY MOUTH EVERYDAY AT BEDTIME   glipiZIDE 5 MG 24 hr tablet Commonly known as: GLUCOTROL XL TAKE ONE TABLET BY MOUTH EVERY MORNING   guaiFENesin-dextromethorphan 100-10 MG/5ML syrup Commonly known as:  ROBITUSSIN DM Take 10 mLs by mouth every 4 (four) hours as needed for cough.   hydrALAZINE 50 MG tablet Commonly known as: APRESOLINE TAKE ONE TABLET BY MOUTH THREE TIMES DAILY   hydrOXYzine 50 MG capsule Commonly known as: VISTARIL TAKE ONE CAPSULE EVERYDAY AT BEDTIME FOR SLEEP   insulin glargine 100 UNIT/ML Solostar Pen Commonly known as: LANTUS Inject 35 Units into the skin at  bedtime.   isosorbide mononitrate 30 MG 24 hr tablet Commonly known as: IMDUR TAKE ONE TABLET BY MOUTH EVERY MORNING   Levemir FlexTouch 100 UNIT/ML FlexPen Generic drug: insulin detemir INJECT 35 UNITS into THE SKIN AT BEDTIME   metoprolol succinate 50 MG 24 hr tablet Commonly known as: TOPROL-XL TAKE ONE TABLET BY MOUTH EVERY EVENING   mirtazapine 15 MG tablet Commonly known as: REMERON TAKE ONE TABLET BY MOUTH EVERY EVENING   molnupiravir EUA 200 mg Caps capsule Commonly known as: LAGEVRIO Take 4 capsules (800 mg total) by mouth 2 (two) times daily for 4 days. Discard remainder after 4 days   oxyCODONE-acetaminophen 5-325 MG tablet Commonly known as: Percocet Take 1 tablet by mouth every 6 (six) hours as needed for severe pain.   Pen Needles 31G X 5 MM Misc Use to inject insulin once daily   rosuvastatin 10 MG tablet Commonly known as: CRESTOR TAKE ONE TABLET BY MOUTH EVERY MORNING   sevelamer carbonate 800 MG tablet Commonly known as: RENVELA Take 800 mg by mouth 3 (three) times daily with meals.   sodium bicarbonate 650 MG tablet Take 650 mg by mouth 3 (three) times daily.   Vitamin D3 25 MCG (1000 UT) Caps Take 1 capsule by mouth daily.        Follow-up Information     Branch, [name], MD .   Specialty: Cardiology Contact information: [place] [address] [phone]                Allergies  Allergen Reactions   Penicillins Itching and Rash    Consultations: Nephrology   Procedures/Studies: DG Chest Port 1 View  Result Date: 11/03/2021 CLINICAL DATA:  Questionable sepsis EXAM: PORTABLE CHEST 1 VIEW COMPARISON:  12/11/2020 FINDINGS: The heart size and mediastinal contours are within normal limits. Large-bore right neck multi lumen vascular catheter. Both lungs are clear. The visualized skeletal structures are unremarkable. IMPRESSION: No acute abnormality of the lungs in AP portable projection. Electronically  Signed   By: Delanna Ahmadi M.D.   On: 11/03/2021 08:53     Discharge Exam: Vitals:   11/05/21 0531 11/05/21 1154  BP: (!) 152/75 (!) 140/79  Pulse: 83 98  Resp: 18 19  Temp: 98.1 F (36.7 C) 99.3 F (37.4 C)  SpO2: 100% 100%   Vitals:   11/04/21 2040 11/05/21 0500 11/05/21 0531 11/05/21 1154  BP: (!) 169/79  (!) 152/75 (!) 140/79  Pulse: (!) 110  83 98  Resp: _0 Temp: 99.1 F (37.3 C)  98.1 F (36.7 C) 99.3 F (37.4 C)  TempSrc: Oral  Oral Oral  SpO2: 98%  100% 100%  Weight: 66.3 kg 65.3 kg    Height:        General: Pt is alert, awake, not in acute distress Cardiovascular: RRR, S1/S2 +, no rubs, no gallops Respiratory: CTA bilaterally, no wheezing, no rhonchi Abdominal: Soft, NT, ND, bowel sounds + Extremities: no edema, no cyanosis    The results of significant diagnostics from this hospitalization (including imaging, microbiology,  ancillary and laboratory) are listed below for reference.     Microbiology: Recent Results (from the past 240 hour(s))  Blood Culture (routine x 2)     Status: None (Preliminary result)   Collection Time: 11/03/21  9:00 AM   Specimen: BLOOD RIGHT ARM  Result Value Ref Range Status   Specimen Description BLOOD RIGHT ARM  Final   Special Requests   Final    BOTTLES DRAWN AEROBIC AND ANAEROBIC Blood Culture adequate volume   Culture   Final    NO GROWTH 2 DAYS Performed at [facility], [address], [place], [place] [address]    Report Status PENDING  Incomplete  Blood Culture (routine x 2)     Status: None (Preliminary result)   Collection Time: 11/03/21  9:11 AM   Specimen: BLOOD RIGHT FOREARM  Result Value Ref Range Status   Specimen Description BLOOD RIGHT FOREARM  Final   Special Requests   Final    BOTTLES DRAWN AEROBIC AND ANAEROBIC Blood Culture results may not be optimal due to an excessive volume of blood received in culture bottles   Culture   Final    NO GROWTH 2 DAYS Performed at [facility], [address] [place], [place] [address]    Report Status PENDING  Incomplete  Resp Panel by RT-PCR (Flu A&B, Covid) Anterior Nasal Swab     Status: Abnormal   Collection Time: 11/03/21  9:19 AM   Specimen: Anterior Nasal Swab  Result Value Ref Range Status   SARS Coronavirus 2 by RT PCR POSITIVE (A) NEGATIVE Final    Comment: RESULT CALLED TO, READ BACK BY AND VERIFIED WITH: N COE RN [id number] [phone] K FORSYTH (NOTE) SARS-CoV-2 target nucleic acids are DETECTED.  The SARS-CoV-2 RNA is generally detectable in upper respiratory specimens during the acute phase of infection. Positive results are indicative of the presence of the identified virus, but do not rule out bacterial infection or co-infection with other pathogens not detected by the test. Clinical correlation with patient history and other diagnostic information is necessary to determine patient infection status. The expected result is Negative.  Fact Sheet for Patients: EntrepreneurPulse.com.au  Fact Sheet for Healthcare Providers: IncredibleEmployment.be  This test is not yet approved or cleared by the [place] FDA and  has been authorized for detection and/or diagnosis of SARS-CoV-2 by FDA under an Emergency Use Authorization (EUA).  This EUA will remain in effect (meaning this test can be [place] ed) for the duration of  the COVID-19 declaration under Section 564(b)(1) of the Act, 21 U.S.C. section 360bbb-3(b)(1), unless the authorization is terminated or revoked sooner.     Influenza A by PCR NEGATIVE NEGATIVE Final   Influenza B by PCR NEGATIVE NEGATIVE Final    Comment: (NOTE) The Xpert Xpress SARS-CoV-2/FLU/RSV plus assay is intended as an aid in the diagnosis of influenza from Nasopharyngeal swab specimens and should not be used as a sole basis for treatment. Nasal washings and aspirates are unacceptable for Xpert Xpress SARS-CoV-2/FLU/RSV testing.  Fact Sheet for  Patients: EntrepreneurPulse.com.au  Fact Sheet for Healthcare Providers: IncredibleEmployment.be  This test is not yet approved or cleared by the [place] FDA and has been authorized for detection and/or diagnosis of SARS-CoV-2 by FDA under an Emergency Use Authorization (EUA). This EUA will remain in effect (meaning this test can be used) for the duration of the COVID-19 declaration under Section 564(b)(1) of the Act, 21 U.S.C. section 360bbb-3(b)(1), unless the authorization is terminated or revoked.  Performed at [facility], [address], [place], [place] [address]   Urine Culture     Status: None   Collection Time: 11/03/21 10:10 AM   Specimen: In/Out Cath Urine  Result Value Ref Range Status   Specimen Description   Final    IN/OUT CATH URINE Performed at [facility], [address], [place], [place] [address]    Special Requests   Final    NONE Performed at [facility], [address], [place], [place] [address]    Culture   Final    NO GROWTH Performed at [facility], [place] [address], [place], [place] [address]    Report Status 11/05/2021 FINAL  Final  MRSA Next Gen by PCR, Nasal     Status: None   Collection Time: 11/04/21  9:01 AM   Specimen: Nasal Mucosa; Nasal Swab  Result Value Ref Range Status   MRSA by PCR Next Gen NOT DETECTED NOT DETECTED Final    Comment: (NOTE) The GeneXpert MRSA Assay (FDA approved for NASAL specimens only), is one component of a comprehensive MRSA colonization surveillance program. It is not intended to diagnose MRSA infection nor to guide or monitor treatment for MRSA infections. Test performance is not FDA approved in patients less than 3 years old. Performed at [facility], [address], [place], [place] [address]      Labs: BNP (last 3 results) No results for input(s): "BNP" in the last 8760 hours. Basic Metabolic Panel: Recent Labs  Lab 11/03/21 0830 11/04/21 0500  11/05/21 0614  NA 135 139 134*  K 4.2 4.2 3.8  CL 101 107 99  CO2 _0 GLUCOSE 372* 96 160*  BUN 37* 45* 37*  CREATININE 6.54* 7.37* 5.20*  CALCIUM 9.0 8.6* 8.2*  MG  --  2.1 2.0   Liver Function Tests: Recent Labs  Lab 11/03/21 0830 11/04/21 0500 11/05/21 0614  AST 14* 12* 289*  ALT _1 ALKPHOS 83 66 134*  BILITOT 0.6 0.3 3.0*  PROT 7.3 6.1* 5.9*  ALBUMIN 3.3* 2.6* 2.5*   No results for input(s): "LIPASE", "AMYLASE" in the last 168 hours. No results for input(s): "AMMONIA" in the last 168 hours. CBC: Recent Labs  Lab 11/03/21 0830 11/04/21 0500 11/05/21 0614  WBC 9.6 7.1 8.6  NEUTROABS 7.9* 4.9 5.6  HGB 8.7* 7.9* 8.2*  HCT 31.7* 29.2* 27.9*  MCV 73.4* 74.1* 72.1*  PLT 215 183 137*   Cardiac Enzymes: No results for input(s): "CKTOTAL", "CKMB", "CKMBINDEX", "TROPONINI" in the last 168 hours. BNP: Invalid input(s): "POCBNP" CBG: Recent Labs  Lab 11/04/21 1142 11/04/21 1649 11/04/21 2149 11/05/21 0713 11/05/21 1119  GLUCAP 188* 131* 198* 159* 280*   D-Dimer Recent Labs    11/04/21 0500 11/05/21 0614  DDIMER 0.97* >20.00*   Hgb A1c No results for input(s): "HGBA1C" in the last 72 hours. Lipid Profile No results for input(s): "CHOL", "HDL", "LDLCALC", "TRIG", "CHOLHDL", "LDLDIRECT" in the last 72 hours. Thyroid function studies No results for input(s): "TSH", "T4TOTAL", "T3FREE", "THYROIDAB" in the last 72 hours.  Invalid input(s): "FREET3" Anemia work up No results for input(s): "VITAMINB12", "FOLATE", "FERRITIN", "TIBC", "IRON", "RETICCTPCT" in the last 72 hours. Urinalysis    Component Value Date/Time   COLORURINE YELLOW 11/03/2021 1010   APPEARANCEUR CLEAR 11/03/2021 1010   LABSPEC 1.012 11/03/2021 1010   PHURINE 7.0 11/03/2021 1010   GLUCOSEU >=500 (A) 11/03/2021 1010   HGBUR NEGATIVE 11/03/2021 1010   BILIRUBINUR NEGATIVE 11/03/2021  [place] 11/03/2021 1010   PROTEINUR >=300 (A) 11/03/2021 1010    UROBILINOGEN 0.2 10/19/2014 1023   NITRITE NEGATIVE 11/03/2021 1010   LEUKOCYTESUR NEGATIVE 11/03/2021 1010   Sepsis Labs Recent Labs  Lab 11/03/21 0830 11/04/21 0500 11/05/21 0614  WBC 9.6 7.1 8.6   Microbiology Recent Results (from the past 240 hour(s))  Blood Culture (routine x 2)     Status: None (Preliminary result)   Collection Time: 11/03/21  9:00 AM   Specimen: BLOOD RIGHT ARM  Result Value Ref Range Status   Specimen Description BLOOD RIGHT ARM  Final   Special Requests   Final    BOTTLES DRAWN AEROBIC AND ANAEROBIC Blood Culture adequate volume   Culture   Final    NO GROWTH 2 DAYS Performed at [facility], [address], [place], [place] [address]    Report Status PENDING  Incomplete  Blood Culture (routine x 2)     Status: None (Preliminary result)   Collection Time: 11/03/21  9:11 AM   Specimen: BLOOD RIGHT FOREARM  Result Value Ref Range Status   Specimen Description BLOOD RIGHT FOREARM  Final   Special Requests   Final    BOTTLES DRAWN AEROBIC AND ANAEROBIC Blood Culture results may not be optimal due to an excessive volume of blood received in culture bottles   Culture   Final    NO GROWTH 2 DAYS Performed at [facility], [address] [place], [place] [address]    Report Status PENDING  Incomplete  Resp Panel by RT-PCR (Flu A&B, Covid) Anterior Nasal Swab     Status: Abnormal   Collection Time: 11/03/21  9:19 AM   Specimen: Anterior Nasal Swab  Result Value Ref Range Status   SARS Coronavirus 2 by RT PCR POSITIVE (A) NEGATIVE Final    Comment: RESULT CALLED TO, READ BACK BY AND VERIFIED WITH: N COE RN [id number] K FORSYTH (NOTE) SARS-CoV-2 target nucleic acids are DETECTED.  The SARS-CoV-2 RNA is generally detectable in upper respiratory specimens during the acute phase of infection. Positive results are indicative of the presence of the identified virus, but do not rule out bacterial infection or co-infection with other pathogens not detected  by the test. Clinical correlation with patient history and other diagnostic information is necessary to determine patient infection status. The expected result is Negative.  Fact Sheet for Patients: EntrepreneurPulse.com.au  Fact Sheet for Healthcare Providers: IncredibleEmployment.be  This test is not yet approved or cleared by the [place] FDA and  has been authorized for detection and/or diagnosis of SARS-CoV-2 by FDA under an Emergency Use Authorization (EUA).  This EUA will remain in effect (meaning this test can be [place] ed) for the duration of  the COVID-19 declaration under Section 564(b)(1) of the Act, 21 U.S.C. section 360bbb-3(b)(1), unless the authorization is terminated or revoked sooner.     Influenza A by PCR NEGATIVE NEGATIVE Final   Influenza B by PCR NEGATIVE NEGATIVE Final    Comment: (NOTE) The Xpert Xpress SARS-CoV-2/FLU/RSV plus assay is intended as an aid in the diagnosis of influenza from Nasopharyngeal swab specimens and should not be used as a sole basis for treatment. Nasal washings and aspirates are unacceptable for Xpert Xpress SARS-CoV-2/FLU/RSV testing.  Fact Sheet for Patients: EntrepreneurPulse.com.au  Fact Sheet for Healthcare Providers: IncredibleEmployment.be  This test is not yet approved or cleared by the [place] FDA and has been authorized for detection and/or diagnosis of SARS-CoV-2 by FDA under  an Emergency Use Authorization (EUA). This EUA will remain in effect (meaning this test can be used) for the duration of the COVID-19 declaration under Section 564(b)(1) of the Act, 21 U.S.C. section 360bbb-3(b)(1), unless the authorization is terminated or revoked.  Performed at [facility], [address], [place], [place] [address]   Urine Culture     Status: None   Collection Time: 11/03/21 10:10 AM   Specimen: In/Out Cath Urine  Result Value Ref Range Status    Specimen Description   Final    IN/OUT CATH URINE Performed at [facility], [address] [place], [place] [address]    Special Requests   Final    NONE Performed at [facility], [address], [place], [place] [address]    Culture   Final    NO GROWTH Performed at [facility], [place] [address] [place], [place] [address]    Report Status 11/05/2021 FINAL  Final  MRSA Next Gen by PCR, Nasal     Status: None   Collection Time: 11/04/21  9:01 AM   Specimen: Nasal Mucosa; Nasal Swab  Result Value Ref Range Status   MRSA by PCR Next Gen NOT DETECTED NOT DETECTED Final    Comment: (NOTE) The GeneXpert MRSA Assay (FDA approved for NASAL specimens only), is one component of a comprehensive MRSA colonization surveillance program. It is not intended to diagnose MRSA infection nor to guide or monitor treatment for MRSA infections. Test performance is not FDA approved in patients less than 62 years old. Performed at [facility], [address], [place],  [address]      Time coordinating discharge: 35 minutes  SIGNED:   Rodena Goldmann, DO [facility] 11/05/2021, 1:42 PM  If 7PM-7AM, please contact night-coverage [email or web address]

## 2021-11-05 NOTE — Consult Note (Signed)
   [facility] Inpatient Consult   11/05/2021  Latoya Walsh [birth year] [id number]  Managed Medicaid: [place]   Primary Care Provider: Pcp, No  [facility] Liaison - remote coverage review for high risk Medicaid  Spoke with patient via hospital phone assisted by hospital operator, HIPAA confirmed. Explained reason for call regarding post hospital care needs. Patient states her "only concern right now is a way to get home, today. All my people are either gone or I don't know what." Explained that this writer will try to let someone know of her issue. Encouraged patient to answer calls from [facility] team.  She said she would.  Plan: Will update MM team of admission and post hospital follow up needs.  Patient has HD MWF noted. Notification sent to Managed Medicaid team member.  For questions,  Natividad Brood, RN BSN [facility] Liaison  [phone] business mobile phone Toll free office [phone]  Fax number: [phone] [email or web address]

## 2021-11-06 LAB — HEPATITIS B SURFACE ANTIBODY, QUANTITATIVE: Hep B S AB Quant (Post): >1000 m[IU]/mL (ref >9.9)

## 2021-11-07 ENCOUNTER — Telehealth: Payer: Self-pay | Admitting: *Deleted

## 2021-11-07 ENCOUNTER — Other Ambulatory Visit: Payer: Self-pay | Admitting: Internal Medicine

## 2021-11-07 DIAGNOSIS — I10 Essential (primary) hypertension: Secondary | ICD-10-CM

## 2021-11-07 NOTE — Patient Outreach (Signed)
Care Coordination  11/07/2021  Latoya Walsh September 21, 1960 [id number]  Transition Care Management Follow-up Telephone Call Date of discharge and from where: 11/05/21 from [facility] How have you been since you were released from the hospital? "I'm not feeling good. I have Covid" Any questions or concerns? No  Items Reviewed: Did the pt receive and understand the discharge instructions provided?  Discharge instructions received, patient unsure of who she is to follow up with Medications obtained and verified? Yes  Other?  Patient was dismissed from previous PCP and needs a new PCP.  Any new allergies since your discharge? No  Dietary orders reviewed? Yes Do you have support at home? Yes   Home Care and Equipment/Supplies: Were home health services ordered? no If so, what is the name of the agency? N/A  Has the agency set up a time to come to the patient's home? not applicable Were any new equipment or medical supplies ordered?  No What is the name of the medical supply agency? N/A Were you able to get the supplies/equipment? not applicable Do you have any questions related to the use of the equipment or supplies? No  Functional Questionnaire: (I = Independent and D = Dependent) ADLs: I  Bathing/Dressing- I  Meal Prep- I  Eating- I  Maintaining continence- I  Transferring/Ambulation- I  Managing Meds- D, Patient's daughter helps with medication management  Follow up appointments reviewed:  PCP Hospital f/u appt confirmed? No  Patient does not have a PCP-Message sent  Specialist Hospital f/u appt confirmed? No   Are transportation arrangements needed? No  If their condition worsens, is the pt aware to call PCP or go to the Emergency Dept.? Yes Was the patient provided with contact information for the PCP's office or ED? Yes Was to pt encouraged to call back with questions or concerns? Yes  RNCM will follow up with Latoya Walsh over the next 14 days  Rulon Eisenmenger,  [place] RN Care Coordinator

## 2021-11-08 DIAGNOSIS — N179 Acute kidney failure, unspecified: Secondary | ICD-10-CM | POA: Diagnosis not present

## 2021-11-08 LAB — CULTURE, BLOOD (ROUTINE X 2)
Culture: NO GROWTH
Culture: NO GROWTH
Special Requests: ADEQUATE

## 2021-11-09 DIAGNOSIS — N179 Acute kidney failure, unspecified: Secondary | ICD-10-CM | POA: Diagnosis not present

## 2021-11-10 ENCOUNTER — Other Ambulatory Visit: Payer: Self-pay | Admitting: Internal Medicine

## 2021-11-10 DIAGNOSIS — I10 Essential (primary) hypertension: Secondary | ICD-10-CM

## 2021-11-11 ENCOUNTER — Other Ambulatory Visit: Payer: Self-pay | Admitting: Cardiology

## 2021-11-11 DIAGNOSIS — I10 Essential (primary) hypertension: Secondary | ICD-10-CM

## 2021-11-15 DIAGNOSIS — N179 Acute kidney failure, unspecified: Secondary | ICD-10-CM | POA: Diagnosis not present

## 2021-11-16 DIAGNOSIS — N179 Acute kidney failure, unspecified: Secondary | ICD-10-CM | POA: Diagnosis not present

## 2021-11-19 ENCOUNTER — Other Ambulatory Visit: Payer: Self-pay | Admitting: *Deleted

## 2021-11-19 NOTE — Patient Outreach (Signed)
  Medicaid Managed Care   Unsuccessful Attempt Note   11/19/2021 Name: SHERETA CROTHERS MRN: [id number] DOB: 1960-09-25  Referred by: Pcp, No Reason for referral : High Risk Managed Medicaid (Unsuccessful RNCM follow up telephone outreach)   An unsuccessful telephone outreach was attempted today. The patient was referred to the case management team for assistance with care management and care coordination.    Follow Up Plan: A HIPAA compliant phone message was left for the patient providing contact information and requesting a return call.    [name] RN, BSN [place]  [employer] manager

## 2021-11-19 NOTE — Patient Instructions (Signed)
Visit Information  Ms. [name]  - as a part of your Medicaid benefit, you are eligible for care management and care coordination services at no cost or copay. I was unable to reach you by phone today but would be happy to help you with your health related needs. Please feel free to call me @ [phone].   A member of the Managed Medicaid care management team will reach out to you again over the next 14 days.   [name] RN, BSN [place]  [employer] manager

## 2021-11-20 DIAGNOSIS — N186 End stage renal disease: Secondary | ICD-10-CM | POA: Diagnosis not present

## 2021-11-20 DIAGNOSIS — E1122 Type 2 diabetes mellitus with diabetic chronic kidney disease: Secondary | ICD-10-CM | POA: Diagnosis not present

## 2021-11-20 DIAGNOSIS — Z992 Dependence on renal dialysis: Secondary | ICD-10-CM | POA: Diagnosis not present

## 2021-11-22 DIAGNOSIS — N179 Acute kidney failure, unspecified: Secondary | ICD-10-CM | POA: Diagnosis not present

## 2021-11-23 DIAGNOSIS — N179 Acute kidney failure, unspecified: Secondary | ICD-10-CM | POA: Diagnosis not present

## 2021-11-25 ENCOUNTER — Ambulatory Visit: Payer: Medicaid Other | Admitting: Podiatry

## 2021-11-29 DIAGNOSIS — N179 Acute kidney failure, unspecified: Secondary | ICD-10-CM | POA: Diagnosis not present

## 2021-11-30 ENCOUNTER — Other Ambulatory Visit: Payer: Self-pay | Admitting: Family Medicine

## 2021-11-30 DIAGNOSIS — N179 Acute kidney failure, unspecified: Secondary | ICD-10-CM | POA: Diagnosis not present

## 2021-12-02 ENCOUNTER — Other Ambulatory Visit: Payer: Self-pay | Admitting: *Deleted

## 2021-12-02 ENCOUNTER — Telehealth: Payer: Self-pay

## 2021-12-02 ENCOUNTER — Telehealth: Payer: Self-pay | Admitting: Licensed Clinical Social Worker

## 2021-12-02 NOTE — Patient Outreach (Signed)
Medicaid Managed Care   Nurse Care Manager Note  12/02/2021 Name:  Latoya Walsh MRN:  [id number] DOB:  07/28/1960  Latoya Walsh is an 61 y.o. year old female who is a primary patient of Pcp, No.  The Medicaid Managed Care Coordination team was consulted for assistance with:    CHF DMII  [name] was given information about Medicaid Managed Care Coordination team services today. [name] Patient agreed to services and verbal consent obtained.  Engaged with patient by telephone for follow up visit in response to provider referral for case management and/or care coordination services.   Assessments/Interventions:  Review of past medical history, allergies, medications, health status, including review of consultants reports, laboratory and other test data, was performed as part of comprehensive evaluation and provision of chronic care management services.  SDOH (Social Determinants of Health) assessments and interventions performed: SDOH Interventions    Flowsheet Row Patient Outreach Telephone from 08/21/2021 in [place] Patient Outreach Telephone from 07/31/2021 in [place] Patient Outreach Telephone from 07/10/2021 in [place] Patient Outreach Telephone from 06/26/2021 in [place] Patient Outreach Telephone from 02/28/2021 in [place] Patient Outreach Telephone from 02/06/2021 in [place] Interventions        Food Insecurity Interventions -- Other (Comment)  [Advised patient to recertify for food benefits] -- -- -- Intervention Not Indicated  [Patient has ample amount of food at this time.]  Housing Interventions -- -- Intervention Not Indicated  [Patient is currently living with her daughter] -- -- Other (Comment)   Engineer, maintenance Guide referral for assistance with heat]  Transportation Interventions -- Other (Comment)  [Provided patient with UHC transportation [phone] -- -- -- --  Depression Interventions/Treatment  -- -- -- Medication --  [Referral to MM LCSW, [place]] --  Stress Interventions Provide Counseling, [employer] -- -- -- -- --       Care Plan  Allergies  Allergen Reactions   Penicillins Itching and Rash    Medications Reviewed Today     Reviewed by Aunesti Montane, RN (Registered Nurse) on 12/02/21 at [place] List Status: <None>   Medication Order Taking? Sig Documenting Provider Last Dose Status Informant  ACCU-CHEK GUIDE test strip [id number] Yes Use to check glucose twice daily [name], NP Taking Active Self, Pharmacy Records  Accu-Chek Softclix Lancets lancets [id number] Yes 2 (two) times daily. [provider] Taking Active Self, Pharmacy Records  albuterol (VENTOLIN HFA) 108 (90 Base) MCG/ACT inhaler [id number] No Inhale 2 puffs into the lungs every 4 (four) hours as needed for wheezing or shortness of breath. Manuella Ghazi, Pratik D, DO Unknown Active   amLODipine (NORVASC) 10 MG tablet [id number] No Take 10 mg by mouth daily. [provider] Unknown Active Self, Pharmacy Records           Med Note [facility]   Mon Nov 03, 2021  2:35 PM) LF:10/08/2021 10 MG TABS (disp 30, 30d supply)   betamethasone dipropionate 0.05 % cream [id number] No Apply topically 2 (two) times daily.  Patient not taking: Reported on 11/04/2021   [name], MD Unknown Active Self, Pharmacy Records  blood glucose meter kit and supplies [id number] Yes Dispense based on patient and insurance preference. Use to monitor glucose twice daily. [name], NP Taking Active Self, Pharmacy Records  calcitRIOL (ROCALTROL) 0.25 MCG capsule [id number] No Take 0.5 mcg by mouth daily. [provider] Unknown Active Self, Pharmacy Records            Med Note [facility]   Mon Nov 03, 2021  2:36 PM) LF:10/08/2021 0.25 MCG CAPS (disp 60, 30d supply)   Cholecalciferol (VITAMIN D3) 25 MCG (1000 UT) CAPS [id number] No Take 1 capsule by mouth daily. [provider] Unknown Active Self, Pharmacy Records           Med Note [facility]   Mon Nov 03, 2021  2:36 PM) LF:10/08/2021 25 MCG (1000 UT) CAPS (disp 30, 30d supply)   furosemide (LASIX) 80 MG tablet [id number] No Take 80 mg  (1 tablet) in the morning and 40 mg (half tablet) in the afternoon. [name], MD Unknown Active Self, Pharmacy Records           Med Note [facility]   Mon Nov 03, 2021  2:36 PM) LF:10/08/2021 40 MG TABS (disp 90, 30d supply)   gabapentin (NEURONTIN) 100 MG capsule [id number] No TAKE ONE CAPSULE BY MOUTH EVERY MORNING and TAKE ONE CAPSULE BY MOUTH EVERYDAY AT BEDTIME [name], MD Unknown Active Self, Pharmacy Records           Med Note [facility]   Mon Nov 03, 2021  2:36 PM) LF:10/08/2021 100 MG CAPS (disp 60, 30d supply)   glipiZIDE (GLUCOTROL XL) 5 MG 24 hr tablet [id number] No TAKE ONE TABLET BY MOUTH EVERY MORNING [name], MD Unknown Active Self, Pharmacy Records           Med Note [facility]   Mon Nov 03, 2021  2:37 PM) LF:09/10/2021 5 MG TB24 (disp 30, 30d supply)   guaiFENesin-dextromethorphan (ROBITUSSIN DM) 100-10 MG/5ML syrup [id number] No Take 10 mLs by mouth every 4 (four) hours as needed for cough. Manuella Ghazi, Pratik D, DO Unknown Active   hydrALAZINE (APRESOLINE) 50 MG tablet [id number] No TAKE ONE TABLET BY MOUTH THREE TIMES DAILY [name], MD Unknown Active Self, Pharmacy Records           Med Note [facility]   Mon Nov 03, 2021  2:37 PM) LF:10/08/2021 50 MG TABS (disp 90, 30d supply)   hydrOXYzine (VISTARIL) 50 MG capsule [id number] No TAKE ONE CAPSULE EVERYDAY AT BEDTIME FOR SLEEP [name], MD Unknown Active Self, Pharmacy Records           Med Note [facility]   Mon Nov 03, 2021   2:37 PM) LF:10/08/2021 50 MG CAPS (disp 30, 30d supply)   insulin glargine (LANTUS) 100 UNIT/ML Solostar Pen [id number] No Inject 35 Units into the skin at bedtime. [name], NP Unknown Active Self, Pharmacy Records           Med Note [facility]   Mon Nov 03, 2021  2:37 PM) LF:04/15/2021 100 UNIT/ML SOPN (disp 12, 34d supply)   Insulin Pen Needle (PEN NEEDLES) 31G X 5 MM MISC [id number] Yes Use to inject insulin once daily [name], NP Taking Active Self, Pharmacy Records  isosorbide mononitrate (IMDUR) 30 MG 24 hr tablet [id number] No TAKE ONE TABLET BY MOUTH EVERY MORNING Branch, [name], MD Unknown Active Self, Pharmacy Records           Med Note [facility]   Mon Nov 03, 2021  2:37 PM) LF:10/08/2021 30 MG TB24 (disp 30, 30d supply)   LEVEMIR FLEXTOUCH 100 UNIT/ML FlexTouch Pen  [id number] Yes INJECT 35 UNITS into THE SKIN AT BEDTIME [name], MD Taking Active Self, Pharmacy Records           Med Note [facility]   Mon Nov 03, 2021  2:37 PM) LF:08/12/2021 100 UNIT/ML SOPN (disp 12, 34d supply)   metoprolol succinate (TOPROL-XL) 50 MG 24 hr tablet [id number] No TAKE ONE TABLET BY MOUTH EVERY EVENING Branch, [name], MD Unknown Active Self, Pharmacy Records           Med Note [facility]   Mon Nov 03, 2021  2:37 PM) LF:10/08/2021 50 MG TB24 (disp 30, 30d supply)   mirtazapine (REMERON) 15 MG tablet [id number] No TAKE ONE TABLET BY MOUTH EVERY EVENING [name], MD Unknown Active Self, Pharmacy Records           Med Note [facility]   Mon Nov 03, 2021  2:38 PM) LF:10/08/2021 15 MG TABS (disp 30, 30d supply)   Nutritional Supplements (FEEDING SUPPLEMENT, NEPRO CARB STEADY,) LIQD [id number] No Take 237 mLs by mouth 2 (two) times daily between meals. Manuella Ghazi, Pratik D, DO Unknown Active   oxyCODONE-acetaminophen (PERCOCET) 5-325 MG tablet [id number] No Take 1 tablet by mouth every 6 (six) hours as needed for severe pain.  Patient not taking:  Reported on 11/04/2021   Early, Arvilla Meres, MD Unknown Active Self, Pharmacy Records           Med Note [facility]   Mon Nov 03, 2021  2:38 PM) LF:08/05/2021  5-325 MG TABS (disp 8, 2d supply)  rosuvastatin (CRESTOR) 10 MG tablet [id number] No TAKE ONE TABLET BY MOUTH EVERY MORNING Branch, [name], MD Unknown Active Self, Pharmacy Records           Med Note [facility]   Mon Nov 03, 2021  2:38 PM) LF:10/08/2021 10 MG TABS (disp 30, 30d supply)   sevelamer carbonate (RENVELA) 800 MG tablet [id number] No Take 800 mg by mouth 3 (three) times daily with meals. [provider] Unknown Active Self, Pharmacy Records           Med Note [facility]   Mon Nov 03, 2021  2:38 PM) LF:10/30/2021 800 MG TABS (disp 42, 14d supply)   sodium bicarbonate 650 MG tablet [id number] No Take 650 mg by mouth 3 (three) times daily. [provider] Unknown Active Self, Pharmacy Records           Med Note [facility]   Mon Nov 03, 2021  2:38 PM) LF:10/08/2021 650 MG TABS (disp 90, 30d supply)   Med List Note [name], [place] [birth year] [id number]):              Patient Active Problem List   Diagnosis Date Noted   Sepsis ([place]) 11/03/2021   ESRD (end stage renal disease) ([place]) 01/06/2021   CHF (congestive heart failure) ([place]) 06/06/2020   Leg swelling 04/28/2020   Hyperlipidemia LDL goal <100 04/28/2020   Left hand pain 03/10/2020   Anemia 02/19/2020   COVID-19 virus infection 12/26/2019   Depression, major, single episode, in partial remission ([place]) 01/25/2019   Headache 11/13/2018   Insomnia 09/25/2018   Screening for colorectal cancer 07/23/2016   Lumbar back pain with radiculopathy affecting right lower extremity 03/27/2015   Non compliance with medical treatment 07/22/2014   Chronic hepatitis C without hepatic coma ([place]) 12/04/2013   Allergic rhinitis 11/03/2012   Severe hypertension 06/25/2011   Anxiety and depression 02/05/2011   Nicotine dependence 11/01/2010  Dermatitis 10/30/2010   Drug dependence, continuous abuse ([place]) 03/22/2010   Uncontrolled type 2 diabetes mellitus with hyperglycemia ([place]) 06/02/2007   Alcohol abuse 06/02/2007    Conditions to be addressed/monitored per PCP order:  CHF and DMII  Care Plan : RN Care Manager Plan of Care  Updates made by Bettylou Montane, RN since 12/02/2021 12:00 AM     Problem: Health Management needs related to DMII and HF      Long-Range Goal: Development of Plan of Care to address Health Management needs related to DMII and HF   Start Date: 07/10/2021  Expected End Date: 01/20/2022  Priority: High  Note:   Current Barriers:  Chronic Disease Management support and education needs related to CHF and DMII Ms. Wieczorek reports feeling tired today. She has been cleaning the house. She is working to establish care with a new PCP, believes it to be [facility] in [place] [address]. She is unsure of the appointment, reports that her daughter is aware and will attend the appointment with her. RNCM unable to speak with patient's daughter today, she was sleeping(works at night). Patient's daughter manages all of her medications. Patient attends dialysis on MWF, which she looks forward to going. Patient was tired and did not want to talk with [place] for a long period of time, she ended the call with "talk to you later" and hung up.  RNCM Clinical Goal(s):  Patient will verbalize understanding of plan for management of CHF and DMII as evidenced by patient verbalization of self monitoring activities take all medications exactly as prescribed and will call provider for medication related questions as evidenced by documentation in EMR    attend all scheduled medical appointments: Dialysis on M/W/F,  Establishing care with PCP, and rescheduling Endocrinology as evidenced by provider documentation          Interventions: Inter-disciplinary care team collaboration (see longitudinal plan of  care) Evaluation of current treatment plan related to  self management and patient's adherence to plan as established by provider RNCM will follow up within 14 days in the afternoon in an attempt to communicate with patient's daughter    Heart Failure Interventions:  (Status: Goal on track: NO.)  Long Term Goal -unable to address today. Patient was tired and ended conversation early Wt Readings from Last 3 Encounters:  11/05/21 143 lb 15.4 oz (65.3 kg)  10/29/21 144 lb 9.6 oz (65.6 kg)  08/14/21 168 lb 1.9 oz (76.3 kg)   Basic overview and discussion of pathophysiology of Heart Failure reviewed Provided education on low sodium diet Discussed the importance of keeping all appointments with provider Assessed social determinant of health barriers  Diabetes:  (Status: Goal on track: NO.) Long Term Goal -unable to address today. Patient was tired and ended conversation early  Lab Results  Component Value Date   HGBA1C >15 08/14/2021   @ Assessed patient's understanding of A1c goal: <8% Provided education to patient about basic DM disease process; Reviewed medications with patient and discussed importance of medication adherence;        Counseled on importance of regular laboratory monitoring as prescribed;        Discussed plans with patient for ongoing care management follow up and provided patient with direct contact information for care management team;      Review of patient status, including review of consultants reports, relevant laboratory and other test results, and medications completed;       Assessed social determinant of health  barriers;        Attempted to reschedule missed appointment with Endocrinology-patient must have lab work completed prior to scheduling missed appointment-reminded patient Advised patient to have labs drawn, must be fasting, labs may be drawn at PCP office-RNCM will collaborate with PCP for needed labs RNCM will follow up after labs are drawn to assist  with scheduling Endocrinology appointment   Patient Goals/Self-Care Activities: Take medications as prescribed   Attend all scheduled provider appointments Call provider office for new concerns or questions  use salt in moderation eat more whole grains, fruits and vegetables, lean meats and healthy fats dress right for the weather, hot or cold Complete requested lab work       Follow Up:  Patient agrees to Care Plan and Follow-up.  Plan: The Managed Medicaid care management team will reach out to the patient again over the next 14 days.  Date/time of next scheduled RN care management/care coordination outreach:  12/12/21 @ 3:30pm  [name] RN, BSN [place] RN Care Coordinator

## 2021-12-02 NOTE — Patient Instructions (Signed)
Visit Information  Ms. Nichelson was given information about Medicaid Managed Care team care coordination services as a part of their [place] Medicaid benefit. [name] verbally consented to engagement with the [facility] Managed Care team.   If you are experiencing a medical emergency, please call 911 or report to your local emergency department or urgent care.   If you have a non-emergency medical problem during routine business hours, please contact your provider's office and ask to speak with a nurse.   For questions related to your [facility], please call: [phone] or visit the homepage here: [email or web address]  If you would like to schedule transportation through your [facility], please call the following number at least 2 days in advance of your appointment: [phone]   Rides for urgent appointments can also be made after hours by calling [employer] Services.  Call the [place] at [phone], at any time, 24 hours a day, 7 days a week. If you are in danger or need immediate medical attention call 911.  If you would like help to quit smoking, call 1-800-QUIT-NOW 762-458-0565) OR Espaol: 1-855-Djelo-Ya (2-035-597-4163) o para ms informacin haga clic aqu or Text READY to 200-400 to register via text  Following is a copy of your plan of care:  Care Plan : General Social Work (Adult)  Updates made by Greg Cutter, LCSW since 12/02/2021 12:00 AM     Problem: I am experiencing grief symptoms   Priority: High  Onset Date: 05/15/2021  Note:   CARE PLAN ENTRY (see longitudinal plan of care for additional care plan information)    Priority: High  Timeframe:  Long-Range Goal Priority:  High Start Date:   12/02/21     Expected End Date:  ongoing         Follow Up Date--None. Goal has been closed as multiple referrals  have been made and grief management coping skill and resource education has been provided. UPDATE case re-opened. Follow up date is 12/17/21 at 2 30 pm  Current Barriers:  Knowledge deficits related to accessing mental health provider in patient with Depression  Patient is experiencing symptoms of  depression which seem to be exacerbated by the loss of her sister.     Patient needs Support, Education, and Care Coordination in order to meet unmet mental health needs  Mental Health Concerns   Clinical Social Work Goal(s):  Over the next 90 days, patient will work with SW and Engineer, drilling by telephone or in person to reduce or manage symptoms of depression and grief Patient will implement clinical interventions discussed today to decreases symptoms of depression and increase knowledge and/or ability of: coping skills. Patient Self Care Activities & Deficits:  Patient is unable to independently navigate community resource options without care coordination support Patient is able to implement clinical interventions discussed today and is motivated for treatment  Patient will select one of the agencies from the list provided and call to schedule an appointment Performs ADL's independently Performs IADL's independently Strong family or social support  10 LITTLE Things To Do When You're Feeling Too Down To Do Anything  Take a shower. Even if you plan to stay in all day long and not see a soul, take a shower. It takes the most effort to hop in to the shower but once you do, you'll feel immediate results. It will wake you up and you'll be feeling much fresher (and  cleaner too).  Brush and floss your teeth. Give your teeth a good brushing with a floss finish. It's a small task but it feels so good and you can check 'taking care of your health' off the list of things to do.  Do something small on your list. Most of [place] have some small thing we would like to get done (load of laundry,  sew a button, email a friend). Doing one of these things will make you feel like you've accomplished something.  Drink water. Drinking water is easy right? It's also really beneficial for your health so keep a glass beside you all day and take sips often. It gives you energy and prevents you from boredom eating.  Do some floor exercises. The last thing you want to do is exercise but it might be just the thing you need the most. Keep it simple and do exercises that involve sitting or laying on the floor. Even the smallest of exercises release chemicals in the brain that make you feel good. Yoga stretches or core exercises are going to make you feel good with minimal effort.  Make your bed. Making your bed takes a few minutes but it's productive and you'll feel relieved when it's done. An unmade bed is a huge visual reminder that you're having an unproductive day. Do it and consider it your housework for the day.  Put on some nice clothes. Take the sweatpants off even if you don't plan to go anywhere. Put on clothes that make you feel good. Take a look in the mirror so your brain recognizes the sweatpants have been replaced with clothes that make you look great. It's an instant confidence booster.  Wash the dishes. A pile of dirty dishes in the sink is a reflection of your mood. It's possible that if you wash up the dishes, your mood will follow suit. It's worth a try.  Cook a real meal. If you have the luxury to have a "do nothing" day, you have time to make a real meal for yourself. Make a meal that you love to eat. The process is good to get you out of the funk and the food will ensure you have more energy for tomorrow.  Write out your thoughts by hand. When you hand write, you stimulate your brain to focus on the moment that you're in so make yourself comfortable and write whatever comes into your mind. Put those thoughts out on paper so they stop spinning around in your head. Those thoughts  might be the very thing holding you down.  Please see past updates related to this goal by clicking on the "Past Updates" button in the selected goal

## 2021-12-02 NOTE — Patient Outreach (Signed)
Medicaid Managed Care Social Work Note  12/02/2021 Name:  Latoya Walsh MRN:  [id number] DOB:  1960/05/26  Latoya Walsh is an 61 y.o. year old female who is a primary patient of Pcp, No.  The Medicaid Managed Care Coordination team was consulted for assistance with:  Latoya Walsh and Resources  Latoya Walsh was given information about Medicaid Managed Care Coordination team services today. Latoya Walsh Patient agreed to services and verbal consent obtained.  Engaged with patient  for by telephone forinitial visit in response to referral for case management and/or care coordination services.   Assessments/Interventions:  Review of past medical history, allergies, medications, health status, including review of consultants reports, laboratory and other test data, was performed as part of comprehensive evaluation and provision of chronic care management services.  SDOH: (Social Determinant of Health) assessments and interventions performed: SDOH Interventions    Flowsheet Row Patient Outreach Telephone from 08/21/2021 in [place] Patient Outreach Telephone from 07/31/2021 in [place] Patient Outreach Telephone from 07/10/2021 in [place] Patient Outreach Telephone from 06/26/2021 in [place] Patient Outreach Telephone from 02/28/2021 in [place] Patient Outreach Telephone from 02/06/2021 in [place] Coordination  SDOH Interventions        Food Insecurity Interventions -- Other (Comment)  [Advised patient to recertify for food benefits] -- -- -- Intervention Not Indicated  [Patient has ample amount of food at this time.]  Housing Interventions -- -- Intervention Not Indicated  [Patient is currently living with her daughter] -- -- Other  (Comment)  Engineer, maintenance Guide referral for assistance with heat]  Transportation Interventions -- Other (Comment)  [Provided patient with UHC transportation [phone] -- -- -- --  Depression Interventions/Treatment  -- -- -- Medication --  [Referral to MM LCSW, Latoya Walsh] --  Stress Interventions Provide Counseling, Latoya Walsh -- -- -- -- --       Advanced Directives Status:  See Care Plan for related entries.  Care Plan                 Allergies  Allergen Reactions   Penicillins Itching and Rash    Medications Reviewed Today     Reviewed by Latoya Montane, RN (Registered Nurse) on 12/02/21 at 73  Med List Status: <None>   Medication Order Taking? Sig Documenting Provider Last Dose Status Informant  ACCU-CHEK GUIDE test strip [id number] Yes Use to check glucose twice daily Latoya Romp, NP Taking Active Self, Pharmacy Records  Accu-Chek Softclix Lancets lancets [id number] Yes 2 (two) times daily. Provider, Historical, Latoya Walsh Taking Active Self, Pharmacy Records  albuterol (VENTOLIN HFA) 108 (90 Base) MCG/ACT inhaler [id number] No Inhale 2 puffs into the lungs every 4 (four) hours as needed for wheezing or shortness of breath. Latoya Walsh, Latoya Walsh, Latoya Walsh Unknown Active   amLODipine (NORVASC) 10 MG tablet [id number] No Take 10 mg by mouth daily. Provider, Historical, Latoya Walsh Unknown Active Self, Pharmacy Records           Med Note Latoya Walsh [facility]   Mon Nov 03, 2021  2:35 PM) LF:10/08/2021 10 MG TABS (disp 30, 30d supply)   betamethasone dipropionate 0.05 % cream [id number] No Apply topically 2 (two) times daily.  Patient not taking: Reported on 11/04/2021   Latoya Helper, Latoya Walsh Unknown Active Self, Pharmacy Records  blood glucose meter kit and supplies [id number] Yes  Dispense based on patient and insurance preference. Use to monitor glucose twice daily. Latoya Romp, NP Taking Active Self, Pharmacy Records  calcitRIOL (ROCALTROL) 0.25 MCG capsule [id number] No Take 0.5 mcg  by mouth daily. Provider, Historical, Latoya Walsh Unknown Active Self, Pharmacy Records           Med Note Latoya Walsh [facility]   Mon Nov 03, 2021  2:36 PM) LF:10/08/2021 0.25 MCG CAPS (disp 60, 30d supply)   Cholecalciferol (VITAMIN D3) 25 MCG (1000 UT) CAPS [id number] No Take 1 capsule by mouth daily. Provider, Historical, Latoya Walsh Unknown Active Self, Pharmacy Records           Med Note Latoya Walsh [facility]   Mon Nov 03, 2021  2:36 PM) LF:10/08/2021 25 MCG (1000 UT) CAPS (disp 30, 30d supply)   furosemide (LASIX) 80 MG tablet [id number] No Take 80 mg  (1 tablet) in the morning and 40 mg (half tablet) in the afternoon. Latoya Lenis, Latoya Walsh Unknown Active Self, Pharmacy Records           Med Note Latoya Walsh   Mon Nov 03, 2021  2:36 PM) LF:10/08/2021 40 MG TABS (disp 90, 30d supply)   gabapentin (NEURONTIN) 100 MG capsule [id number] No TAKE ONE CAPSULE BY MOUTH EVERY MORNING and TAKE ONE CAPSULE BY MOUTH EVERYDAY AT BEDTIME Latoya Helper, Latoya Walsh Unknown Active Self, Pharmacy Records           Med Note Latoya Walsh   Mon Nov 03, 2021  2:36 PM) LF:10/08/2021 100 MG CAPS (disp 60, 30d supply)   glipiZIDE (GLUCOTROL XL) 5 MG 24 hr tablet [id number] No TAKE ONE TABLET BY MOUTH EVERY MORNING Latoya Helper, Latoya Walsh Unknown Active Self, Pharmacy Records           Med Note Latoya Walsh   Mon Nov 03, 2021  2:37 PM) LF:09/10/2021 5 MG TB24 (disp 30, 30d supply)   guaiFENesin-dextromethorphan (ROBITUSSIN DM) 100-10 MG/5ML syrup [id number] No Take 10 mLs by mouth every 4 (four) hours as needed for cough. Latoya Walsh, Latoya Walsh, Latoya Walsh Unknown Active   hydrALAZINE (APRESOLINE) 50 MG tablet [id number] No TAKE ONE TABLET BY MOUTH THREE TIMES DAILY Latoya Spar, Latoya Walsh Unknown Active Self, Pharmacy Records           Med Note Latoya Walsh   Mon Nov 03, 2021  2:37 PM) LF:10/08/2021 50 MG TABS (disp 90, 30d supply)   hydrOXYzine (VISTARIL) 50 MG capsule [id number] No TAKE ONE CAPSULE EVERYDAY AT BEDTIME FOR SLEEP Latoya Helper, Latoya Walsh  Unknown Active Self, Pharmacy Records           Med Note Latoya Walsh   Mon Nov 03, 2021  2:37 PM) LF:10/08/2021 50 MG CAPS (disp 30, 30d supply)   insulin glargine (LANTUS) 100 UNIT/ML Solostar Pen [id number] No Inject 35 Units into the skin at bedtime. Latoya Romp, NP Unknown Active Self, Pharmacy Records           Med Note Latoya Walsh   Mon Nov 03, 2021  2:37 PM) LF:04/15/2021 100 UNIT/ML SOPN (disp 12, 34d supply)   Insulin Pen Needle (PEN NEEDLES) 31G X 5 MM MISC [id number] Yes Use to inject insulin once daily Latoya Romp, NP Taking Active Self, Pharmacy Records  isosorbide mononitrate (IMDUR) 30 MG 24 hr tablet [id number] No TAKE ONE TABLET BY MOUTH EVERY MORNING Branch, [name], Latoya Walsh Unknown Active Self, Pharmacy Records           Med Note [place]  Mon Nov 03, 2021  2:37 PM) LF:10/08/2021 30 MG TB24 (disp 30, 30d supply)   LEVEMIR FLEXTOUCH 100 UNIT/ML FlexTouch Pen [id number] Yes INJECT 35 UNITS into THE SKIN AT BEDTIME [name], Latoya Walsh Taking Active Self, Pharmacy Records           Med Note Latoya Walsh   Mon Nov 03, 2021  2:37 PM) LF:08/12/2021 100 UNIT/ML SOPN (disp 12, 34d supply)   metoprolol succinate (TOPROL-XL) 50 MG 24 hr tablet [id number] No TAKE ONE TABLET BY MOUTH EVERY EVENING Branch, [name], Latoya Walsh Unknown Active Self, Pharmacy Records           Med Note Latoya Walsh   Mon Nov 03, 2021  2:37 PM) LF:10/08/2021 50 MG TB24 (disp 30, 30d supply)   mirtazapine (REMERON) 15 MG tablet [id number] No TAKE ONE TABLET BY MOUTH EVERY EVENING Latoya Helper, Latoya Walsh Unknown Active Self, Pharmacy Records           Med Note Latoya Walsh   Mon Nov 03, 2021  2:38 PM) LF:10/08/2021 15 MG TABS (disp 30, 30d supply)   Nutritional Supplements (FEEDING SUPPLEMENT, NEPRO CARB STEADY,) LIQD [id number] No Take 237 mLs by mouth 2 (two) times daily between meals. Latoya Walsh, Latoya Walsh, Latoya Walsh Unknown Active   oxyCODONE-acetaminophen (PERCOCET) 5-325 MG tablet [id number]  No Take 1 tablet by mouth every 6 (six) hours as needed for severe pain.  Patient not taking: Reported on 11/04/2021   Early, Arvilla Meres, Latoya Walsh Unknown Active Self, Pharmacy Records           Med Note Latoya Walsh [facility]   Mon Nov 03, 2021  2:38 PM) LF:08/05/2021  5-325 MG TABS (disp 8, 2d supply)  rosuvastatin (CRESTOR) 10 MG tablet [id number] No TAKE ONE TABLET BY MOUTH EVERY MORNING Branch, [name], Latoya Walsh Unknown Active Self, Pharmacy Records           Med Note Latoya Walsh   Mon Nov 03, 2021  2:38 PM) LF:10/08/2021 10 MG TABS (disp 30, 30d supply)   sevelamer carbonate (RENVELA) 800 MG tablet [id number] No Take 800 mg by mouth 3 (three) times daily with meals. Provider, Historical, Latoya Walsh Unknown Active Self, Pharmacy Records           Med Note Latoya Walsh [facility]   Mon Nov 03, 2021  2:38 PM) LF:10/30/2021 800 MG TABS (disp 42, 14d supply)   sodium bicarbonate 650 MG tablet [id number] No Take 650 mg by mouth 3 (three) times daily. Provider, Historical, Latoya Walsh Unknown Active Self, Pharmacy Records           Med Note Latoya Walsh   Mon Nov 03, 2021  2:38 PM) LF:10/08/2021 650 MG TABS (disp 90, 30d supply)   Med List Note [name], [place] [birth year] 0768):              Patient Active Problem List   Diagnosis Date Noted   Sepsis ([place]) 11/03/2021   ESRD (end stage renal disease) ([place]) 01/06/2021   CHF (congestive heart failure) ([place]) 06/06/2020   Leg swelling 04/28/2020   Hyperlipidemia LDL goal <100 04/28/2020   Left hand pain 03/10/2020   Anemia 02/19/2020   COVID-19 virus infection 12/26/2019   Depression, major, single episode, in partial remission ([place]) 01/25/2019   Headache 11/13/2018   Insomnia 09/25/2018   Screening for colorectal cancer 07/23/2016   Lumbar back pain with radiculopathy affecting right lower extremity 03/27/2015   Non compliance with medical treatment 07/22/2014   Chronic hepatitis C without hepatic coma (  [place]) 12/04/2013   Allergic rhinitis 11/03/2012   Severe  hypertension 06/25/2011   Anxiety and depression 02/05/2011   Nicotine dependence 11/01/2010   Dermatitis 10/30/2010   Drug dependence, continuous abuse ([place]) 03/22/2010   Uncontrolled type 2 diabetes mellitus with hyperglycemia ([place]) 06/02/2007   Alcohol abuse 06/02/2007    Conditions to be addressed/monitored per PCP order:  Depression  Care Plan : General Social Work (Adult)  Updates made by Greg Cutter, LCSW since 12/02/2021 12:00 AM     Problem: I am experiencing grief symptoms   Priority: High  Onset Date: 05/15/2021  Note:   CARE PLAN ENTRY (see longitudinal plan of care for additional care plan information)    Priority: High  Timeframe:  Long-Range Goal Priority:  High Start Date:   12/02/21     Expected End Date:  ongoing         Follow Up Date--None. Goal has been closed as multiple referrals have been made and grief management coping skill and resource education has been provided. UPDATE case re-opened. Follow up date is 12/17/21 at 2 30 pm  Current Barriers:  Knowledge deficits related to accessing mental health provider in patient with Depression  Patient is experiencing symptoms of  depression which seem to be exacerbated by the loss of her sister.     Patient needs Support, Education, and Care Coordination in order to meet unmet mental health needs  Mental Health Concerns   Clinical Social Work Goal(s):  Over the next 90 days, patient will work with SW and Engineer, drilling by telephone or in person to reduce or manage symptoms of depression and grief Patient will implement clinical interventions discussed today to decreases symptoms of depression and increase knowledge and/or ability of: coping skills.  Interventions:  Assessed patient's understanding, education, previous treatment and care coordination needs  Patient continues to discuss difficulty in dealing with the recent loss of her sister evidenced by depressed mood, insomnia and crying  spells Patient confirms having family support, lives with her daughter at this time but is working with her Education officer, museum to find her own place. Dialysis continues on Monday, Wednesdays and Fridays-vans provides transport Patient reports that she goes to bible study weekly and watches church sermons on Sundays. She reports that her church provides stable transportation to and from bible study during the week. Patient reports that she uses Medicaid transportation Provided basic mental health support, education and interventions  Grief response normalized, emotional support provided, grief counseling encouraged Collaborated with appropriate clinical care team members regarding patient's needs. [facility] LCSW mae referral to [facility] RNCM for chronic pain management.  Discussed  options for long term counseling based on need and insurance. Patient agreeable to ongoing mental health counseling Patient was referred to grief therapy through Hospice on 06/26/21 Reviewed mental health medications with patient prescribed by PCP and discussed compliance  Other interventions include: Motivational Interviewing employed Active listening / Reflection utilized  Emotional Support Provided  UPDATE 08/01/21- [facility] RNCM informed [facility] LCSW that patient had not been contacted by [facility]osp[facility] yet. [facility] LCSW contacted [facility]osp[facility] to inquire and left a message. [facility] LCSW received a return call from [facility] counselor and was informed that they have talked to patient twice. Patient said she had a headache and had to get off the phone the first call and the second call patient reported that she did not want bereavement counseling anymore because she was doing well. [facility] LCSW completed call to patient and provided this information.  [place] LCSW  updated [facility] RNCM with final update. Patient is agreeable to grief counseling and admits she has been experiencing some confusion recently. Patient was educated on their available grief support groups as well. [facility] LCSW  will follow up next month. UPDATE 08/21/21- Patient reports that she did not complete grief therapy with [facility]. She reports that they may have left her a voice message but she has not been checking her messages. [facility] LCSW informed her that they were going to make one last outreach to her in order to schedule her initial appointment but if they were unable to reach her at this last outreach attempt then they would not be able to accept an additional referral and would prefer for patient to contact them directly if she wishes to gain services. [facility] LCSW provided patient with their contact number which is [phone]. Patient reports that her hand hurts and she needs to get off the phone. She reports that she is not able to talk on the phone long because of the nerve pain in her hand. [facility] LCSW informed patient that [facility] LCSW will be closing her case for social work support now that grief coping skill and resource education has been provided and two referrals made for bereavement counseling. Patient expressed understanding and is agreeable. [place] LCSW will update [place] who is still involved in patient's care. UPDATE- 12/02/21- Patient is interested in gaining telephonic therapy. Case re-opened on 12/02/21 and referral made to [facility] by [facility] LCSW today on 12/02/21 as well. [facility] RNCM and BSW updated.  Patient Self Care Activities & Deficits:  Patient is unable to independently navigate community resource options without care coordination support Patient is able to implement clinical interventions discussed today and is motivated for treatment  Patient will select one of the agencies from the list provided and call to schedule an appointment Performs ADL's independently Performs IADL's independently Strong family or social support  10 LITTLE Things To Latoya Walsh When You're Feeling Too Down To Latoya Walsh Anything  Take a shower. Even if you plan to stay in all day long and not see a soul, take a  shower. It takes the most effort to hop in to the shower but once you Latoya Walsh, you'll feel immediate results. It will wake you up and you'll be feeling much fresher (and cleaner too).  Brush and floss your teeth. Give your teeth a good brushing with a floss finish. It's a small task but it feels so good and you can check 'taking care of your health' off the list of things to Latoya Walsh.  Latoya Walsh something small on your list. Most of [place] have some small thing we would like to get done (load of laundry, sew a button, email a friend). Doing one of these things will make you feel like you've accomplished something.  Drink water. Drinking water is easy right? It's also really beneficial for your health so keep a glass beside you all day and take sips often. It gives you energy and prevents you from boredom eating.  Latoya Walsh some floor exercises. The last thing you want to Latoya Walsh is exercise but it might be just the thing you need the most. Keep it simple and Latoya Walsh exercises that involve sitting or laying on the floor. Even the smallest of exercises release chemicals in the brain that make you feel good. Yoga stretches or core exercises are going to make you feel good with minimal effort.  Make your bed. Making your bed  takes a few minutes but it's productive and you'll feel relieved when it's done. An unmade bed is a huge visual reminder that you're having an unproductive day. Latoya Walsh it and consider it your housework for the day.  Put on some nice clothes. Take the sweatpants off even if you don't plan to go anywhere. Put on clothes that make you feel good. Take a look in the mirror so your brain recognizes the sweatpants have been replaced with clothes that make you look great. It's an instant confidence booster.  Wash the dishes. A pile of dirty dishes in the sink is a reflection of your mood. It's possible that if you wash up the dishes, your mood will follow suit. It's worth a try.  Cook a real meal. If you have the luxury to  have a "Latoya Walsh nothing" day, you have time to make a real meal for yourself. Make a meal that you love to eat. The process is good to get you out of the funk and the food will ensure you have more energy for tomorrow.  Write out your thoughts by hand. When you hand write, you stimulate your brain to focus on the moment that you're in so make yourself comfortable and write whatever comes into your mind. Put those thoughts out on paper so they stop spinning around in your head. Those thoughts might be the very thing holding you down.  Please see past updates related to this goal by clicking on the "Past Updates" button in the selected goal       Follow up:  Patient agrees to Care Plan and Follow-up.  Plan: The Managed Medicaid care management team will reach out to the patient again over the next 30 days.  Date/time of next scheduled Social Work care management/care coordination outreach:  12/17/21 at 230 pm  Eula Fried, [place], MSW, [place] Medicaid LCSW [email or web address] Phone: [phone]

## 2021-12-02 NOTE — Telephone Encounter (Signed)
Pt saw MD last month and two days later had another referral placed. I spoke to pt who denies the need to be seen, as she feels the catheter is working okay for now, even though she does states sometimes it has issues when being accessed. I spoke to [facility] at Dr. Birdie Riddle office who entered referral and she states pt's catheter only works when pt is laying back, as it very positional. Pt then complains because she is having to lay back. Kieth Brightly is asking if AVG is an option. I have relayed this to MD and will f/u with Kieth Brightly to let her know his response.

## 2021-12-02 NOTE — Patient Instructions (Signed)
Visit Information  Latoya Walsh was given information about Medicaid Managed Care team care coordination services as a part of their [place] Medicaid benefit. Latoya Walsh verbally consented to engagement with the [facility] Managed Care team.   If you are experiencing a medical emergency, please call 911 or report to your local emergency department or urgent care.   If you have a non-emergency medical problem during routine business hours, please contact your provider's office and ask to speak with a nurse.   For questions related to your [facility], please call: [phone] or visit the homepage here: [email or web address]  If you would like to schedule transportation through your [facility], please call the following number at least 2 days in advance of your appointment: [phone]   Rides for urgent appointments can also be made after hours by calling Member Services.  Call the [place] at [phone], at any time, 24 hours a day, 7 days a week. If you are in danger or need immediate medical attention call 911.  If you would like help to quit smoking, call 1-800-QUIT-NOW 252 732 4293) OR Espaol: 1-855-Djelo-Ya (0-932-355-7322) o para ms informacin haga clic aqu or Text READY to 200-400 to register via text  Latoya Walsh,   Please see education materials related to diabetes provided by MyChart link. and as Ad[employer] account planner.   The patient verbalized understanding of instructions, educational materials, and care plan provided today and agreed to receive a mailed copy of patient instructions, educational materials, and care plan.   Telephone follow up appointment with Managed Medicaid care management team member scheduled for:12/12/21 @ 3:30pm  Latoya Joiner RN, BSN [place] RN Care  Coordinator   Following is a copy of your plan of care:  Care Plan : RN Care Manager Plan of Care  Updates made by Latoya Montane, RN since 12/02/2021 12:00 AM     Problem: Health Management needs related to DMII and HF      Long-Range Goal: Development of Plan of Care to address Health Management needs related to DMII and HF   Start Date: 07/10/2021  Expected End Date: 01/20/2022  Priority: High  Note:   Current Barriers:  Chronic Disease Management support and education needs related to CHF and DMII Ms. Falkenstein reports feeling tired today. She has been cleaning the house. She is working to establish care with a new PCP, believes it to be [facility] in [place] on [address]. She is unsure of the appointment, reports that her daughter is aware and will attend the appointment with her. RNCM unable to speak with patient's daughter today, she was sleeping(works at night). Patient's daughter manages all of her medications. Patient attends dialysis on MWF, which she looks forward to going. Patient was tired and did not want to talk with [facility] for a long period of time, she ended the call with "talk to you later" and hung up.  RNCM Clinical Goal(s):  Patient will verbalize understanding of plan for management of CHF and DMII as evidenced by patient verbalization of self monitoring activities take all medications exactly as prescribed and will call provider for medication related questions as evidenced by documentation in EMR    attend all scheduled medical appointments: Dialysis on M/W/F,  Establishing care with PCP, and rescheduling Endocrinology as evidenced by provider documentation          Interventions: Inter-disciplinary care team collaboration (see longitudinal  plan of care) Evaluation of current treatment plan related to  self management and patient's adherence to plan as established by provider RNCM will follow up within 14 days in the afternoon in an attempt to  communicate with patient's daughter    Heart Failure Interventions:  (Status: Goal on track: NO.)  Long Term Goal -unable to address today. Patient was tired and ended conversation early Wt Readings from Last 3 Encounters:  11/05/21 143 lb 15.4 oz (65.3 kg)  10/29/21 144 lb 9.6 oz (65.6 kg)  08/14/21 168 lb 1.9 oz (76.3 kg)   Basic overview and discussion of pathophysiology of Heart Failure reviewed Provided education on low sodium diet Discussed the importance of keeping all appointments with provider Assessed social determinant of health barriers  Diabetes:  (Status: Goal on track: NO.) Long Term Goal -unable to address today. Patient was tired and ended conversation early  Lab Results  Component Value Date   HGBA1C >15 08/14/2021   @ Assessed patient's understanding of A1c goal: <8% Provided education to patient about basic DM disease process; Reviewed medications with patient and discussed importance of medication adherence;        Counseled on importance of regular laboratory monitoring as prescribed;        Discussed plans with patient for ongoing care management follow up and provided patient with direct contact information for care management team;      Review of patient status, including review of consultants reports, relevant laboratory and other test results, and medications completed;       Assessed social determinant of health barriers;        Attempted to reschedule missed appointment with Endocrinology-patient must have lab work completed prior to scheduling missed appointment-reminded patient Advised patient to have labs drawn, must be fasting, labs may be drawn at PCP office-RNCM will collaborate with PCP for needed labs RNCM will follow up after labs are drawn to assist with scheduling Endocrinology appointment   Patient Goals/Self-Care Activities: Take medications as prescribed   Attend all scheduled provider appointments Call provider office for new concerns or  questions  use salt in moderation eat more whole grains, fruits and vegetables, lean meats and healthy fats dress right for the weather, hot or cold Complete requested lab work

## 2021-12-04 ENCOUNTER — Other Ambulatory Visit: Payer: Self-pay | Admitting: Internal Medicine

## 2021-12-04 DIAGNOSIS — E1165 Type 2 diabetes mellitus with hyperglycemia: Secondary | ICD-10-CM

## 2021-12-06 DIAGNOSIS — N179 Acute kidney failure, unspecified: Secondary | ICD-10-CM | POA: Diagnosis not present

## 2021-12-07 DIAGNOSIS — N179 Acute kidney failure, unspecified: Secondary | ICD-10-CM | POA: Diagnosis not present

## 2021-12-08 ENCOUNTER — Other Ambulatory Visit: Payer: Self-pay | Admitting: Family Medicine

## 2021-12-10 ENCOUNTER — Other Ambulatory Visit: Payer: Self-pay | Admitting: Nurse Practitioner

## 2021-12-10 DIAGNOSIS — E1165 Type 2 diabetes mellitus with hyperglycemia: Secondary | ICD-10-CM

## 2021-12-12 ENCOUNTER — Other Ambulatory Visit: Payer: Self-pay | Admitting: *Deleted

## 2021-12-12 NOTE — Patient Outreach (Signed)
Medicaid Managed Care   Nurse Care Manager Note  12/12/2021 Name:  Latoya Walsh MRN:  [id number] DOB:  09-20-1960  Latoya Walsh is an 61 y.o. year old female who is a primary patient of Pcp, No.  The Medicaid Managed Care Coordination team was consulted for assistance with:    CHF DMII  Ms. Bagot was given information about Medicaid Managed Care Coordination team services today. Latoya Walsh Patient agreed to services and verbal consent obtained.  Engaged with patient by telephone for follow up visit in response to provider referral for case management and/or care coordination services.   Assessments/Interventions:  Review of past medical history, allergies, medications, health status, including review of consultants reports, laboratory and other test data, was performed as part of comprehensive evaluation and provision of chronic care management services.  SDOH (Social Determinants of Health) assessments and interventions performed: SDOH Interventions    Flowsheet Row Patient Outreach Telephone from 08/21/2021 in [place] Patient Outreach Telephone from 07/31/2021 in [place] Patient Outreach Telephone from 07/10/2021 in [place] Patient Outreach Telephone from 06/26/2021 in [place] Patient Outreach Telephone from 02/28/2021 in [place] Patient Outreach Telephone from 02/06/2021 in [place] Interventions        Food Insecurity Interventions -- Other (Comment)  [Advised patient to recertify for food benefits] -- -- -- Intervention Not Indicated  [Patient has ample amount of food at this time.]  Housing Interventions -- -- Intervention Not Indicated  [Patient is currently living with her daughter] -- -- Other (Comment)   Engineer, maintenance Guide referral for assistance with heat]  Transportation Interventions -- Other (Comment)  [Provided patient with UHC transportation [phone] -- -- -- --  Depression Interventions/Treatment  -- -- -- Medication --  [Referral to MM LCSW, [place]] --  Stress Interventions Provide Counseling, [employer] -- -- -- -- --       Care Plan  Allergies  Allergen Reactions   Penicillins Itching and Rash    Medications Reviewed Today     Reviewed by Danicka Montane, RN (Registered Nurse) on 12/12/21 at [place] List Status: <None>   Medication Order Taking? Sig Documenting Provider Last Dose Status Informant  ACCU-CHEK GUIDE test strip [id number] Yes Use to check glucose twice daily Latoya Walsh Taking Active Self, Pharmacy Records  Accu-Chek Softclix Lancets lancets [id number] Yes 2 (two) times daily. Provider, Historical, Walsh Taking Active Self, Pharmacy Records  albuterol (VENTOLIN HFA) 108 (90 Base) MCG/ACT inhaler [id number] Yes Inhale 2 puffs into the lungs every 4 (four) hours as needed for wheezing or shortness of breath. Latoya Walsh Taking Active   amLODipine (NORVASC) 10 MG tablet [id number] No Take 10 mg by mouth daily. Provider, Historical, Walsh Unknown Active Self, Pharmacy Records           Med Note [facility]   Mon Nov 03, 2021  2:35 PM) LF:10/08/2021 10 MG TABS (disp 30, 30d supply)   betamethasone dipropionate 0.05 % cream [id number] No Apply topically 2 (two) times daily.  Patient not taking: Reported on 11/04/2021   Latoya Walsh Unknown Active Self, Pharmacy Records  blood glucose meter kit and supplies [id number] Yes Dispense based on patient and insurance preference. Use to monitor glucose twice daily. Latoya Walsh Taking Active Self, Pharmacy Records  calcitRIOL (ROCALTROL) 0.25 MCG capsule [id number] No Take 0.5 mcg by mouth daily. Provider, Historical, Walsh Unknown Active Self, Pharmacy Records            Med Note [facility]   Mon Nov 03, 2021  2:36 PM) LF:10/08/2021 0.25 MCG CAPS (disp 60, 30d supply)   Cholecalciferol (VITAMIN D3) 25 MCG (1000 UT) CAPS [id number] No Take 1 capsule by mouth daily. Provider, Historical, Walsh Unknown Active Self, Pharmacy Records           Med Note [facility]   Mon Nov 03, 2021  2:36 PM) LF:10/08/2021 25 MCG (1000 UT) CAPS (disp 30, 30d supply)   furosemide (LASIX) 80 MG tablet [id number] No Take 80 mg  (1 tablet) in the morning and 40 mg (half tablet) in the afternoon. Latoya Walsh Unknown Active Self, Pharmacy Records           Med Note Latoya Walsh   Mon Nov 03, 2021  2:36 PM) LF:10/08/2021 40 MG TABS (disp 90, 30d supply)   gabapentin (NEURONTIN) 100 MG capsule [id number] No TAKE ONE CAPSULE BY MOUTH EVERY MORNING and TAKE ONE CAPSULE BY MOUTH EVERYDAY AT BEDTIME Latoya Walsh Unknown Active Self, Pharmacy Records           Med Note Latoya Walsh   Mon Nov 03, 2021  2:36 PM) LF:10/08/2021 100 MG CAPS (disp 60, 30d supply)   glipiZIDE (GLUCOTROL XL) 5 MG 24 hr tablet [id number] No TAKE ONE TABLET BY MOUTH EVERY MORNING Latoya Walsh Unknown Active Self, Pharmacy Records           Med Note Latoya Walsh   Mon Nov 03, 2021  2:37 PM) LF:09/10/2021 5 MG TB24 (disp 30, 30d supply)   guaiFENesin-dextromethorphan (ROBITUSSIN DM) 100-10 MG/5ML syrup [id number] No Take 10 mLs by mouth every 4 (four) hours as needed for cough. Latoya Walsh Unknown Active   hydrALAZINE (APRESOLINE) 50 MG tablet [id number] No TAKE ONE TABLET BY MOUTH THREE TIMES DAILY Latoya Walsh Unknown Active Self, Pharmacy Records           Med Note Latoya Walsh   Mon Nov 03, 2021  2:37 PM) LF:10/08/2021 50 MG TABS (disp 90, 30d supply)   hydrOXYzine (VISTARIL) 50 MG capsule [id number] No TAKE ONE CAPSULE EVERYDAY AT BEDTIME FOR SLEEP Latoya Walsh Unknown Active Self, Pharmacy Records           Med Note Latoya Walsh   Mon Nov 03, 2021   2:37 PM) LF:10/08/2021 50 MG CAPS (disp 30, 30d supply)   insulin glargine (LANTUS) 100 UNIT/ML Solostar Pen [id number] No Inject 35 Units into the skin at bedtime.  Patient not taking: Reported on 12/12/2021   Latoya Walsh Not Taking Active Self, Pharmacy Records           Med Note Latoya Walsh   Mon Nov 03, 2021  2:37 PM) LF:04/15/2021 100 UNIT/ML SOPN (disp 12, 34d supply)   Insulin Pen Needle (PEN NEEDLES) 31G X 5 MM MISC [id number] No Use to inject insulin once daily  Patient not taking: Reported on 12/12/2021   Latoya Walsh Not Taking Active Self, Pharmacy Records  isosorbide mononitrate (IMDUR) 30 MG 24 hr tablet [id number] No TAKE ONE TABLET BY MOUTH EVERY MORNING Branch, [name], Walsh Unknown Active Self, Pharmacy Records           Med Note Latoya Walsh   Mon Nov 03, 2021  2:37 PM) LF:10/08/2021 30 MG TB24 (disp 30, 30d supply)   LEVEMIR FLEXTOUCH 100 UNIT/ML FlexTouch Pen [id number] Yes INJECT 35 UNITS into THE SKIN AT BEDTIME [name], Walsh Taking Active Self, Pharmacy Records           Med Note Latoya Walsh   Mon Nov 03, 2021  2:37 PM) LF:08/12/2021 100 UNIT/ML SOPN (disp 12, 34d supply)   metoprolol succinate (TOPROL-XL) 50 MG 24 hr tablet [id number] No TAKE ONE TABLET BY MOUTH EVERY EVENING Branch, [name], Walsh Unknown Active Self, Pharmacy Records           Med Note Latoya Walsh   Mon Nov 03, 2021  2:37 PM) LF:10/08/2021 50 MG TB24 (disp 30, 30d supply)   mirtazapine (REMERON) 15 MG tablet [id number] No TAKE ONE TABLET BY MOUTH EVERY EVENING Latoya Walsh Unknown Active Self, Pharmacy Records           Med Note Latoya Walsh   Mon Nov 03, 2021  2:38 PM) LF:10/08/2021 15 MG TABS (disp 30, 30d supply)   oxyCODONE-acetaminophen (PERCOCET) 5-325 MG tablet [id number] No Take 1 tablet by mouth every 6 (six) hours as needed for severe pain.  Patient not taking: Reported on 11/04/2021   Early, Arvilla Meres, Walsh Unknown Active Self, Pharmacy  Records           Med Note [facility]   Mon Nov 03, 2021  2:38 PM) LF:08/05/2021  5-325 MG TABS (disp 8, 2d supply)  rosuvastatin (CRESTOR) 10 MG tablet [id number] No TAKE ONE TABLET BY MOUTH EVERY MORNING Branch, [name], Walsh Unknown Active Self, Pharmacy Records           Med Note Latoya Walsh   Mon Nov 03, 2021  2:38 PM) LF:10/08/2021 10 MG TABS (disp 30, 30d supply)   sevelamer carbonate (RENVELA) 800 MG tablet [id number] No Take 800 mg by mouth 3 (three) times daily with meals. Provider, Historical, Walsh Unknown Active Self, Pharmacy Records           Med Note [facility]   Mon Nov 03, 2021  2:38 PM) LF:10/30/2021 800 MG TABS (disp 42, 14d supply)   sodium bicarbonate 650 MG tablet [id number] No Take 650 mg by mouth 3 (three) times daily. Provider, Historical, Walsh Unknown Active Self, Pharmacy Records           Med Note Latoya Walsh   Mon Nov 03, 2021  2:38 PM) LF:10/08/2021 650 MG TABS (disp 90, 30d supply)   Med List Note [name], [place] 74/25/95 6387):              Patient Active Problem List   Diagnosis Date Noted   Sepsis ([place]) 11/03/2021   ESRD (end stage renal disease) ([place]) 01/06/2021   CHF (congestive heart failure) ([place]) 06/06/2020   Leg swelling 04/28/2020   Hyperlipidemia LDL goal <100 04/28/2020   Left hand pain 03/10/2020   Anemia 02/19/2020   COVID-19 virus infection 12/26/2019   Depression, major, single episode, in partial remission ([place]) 01/25/2019   Headache 11/13/2018   Insomnia 09/25/2018   Screening for colorectal cancer 07/23/2016   Lumbar back pain with radiculopathy affecting right lower extremity 03/27/2015   Non compliance with medical treatment 07/22/2014   Chronic hepatitis C without hepatic coma ([place]) 12/04/2013   Allergic rhinitis 11/03/2012   Severe hypertension 06/25/2011   Anxiety and depression 02/05/2011   Nicotine dependence 11/01/2010   Dermatitis 10/30/2010   Drug dependence, continuous  abuse ([place]) 03/22/2010    Uncontrolled type 2 diabetes mellitus with hyperglycemia ([place]) 06/02/2007   Alcohol abuse 06/02/2007    Conditions to be addressed/monitored per PCP order:  CHF and DMII  Care Plan : RN Care Manager Plan of Care  Updates made by Seanna Montane, RN since 12/12/2021 12:00 AM     Problem: Health Management needs related to DMII and HF      Long-Range Goal: Development of Plan of Care to address Health Management needs related to DMII and HF   Start Date: 07/10/2021  Expected End Date: 01/20/2022  Priority: High  Note:   Current Barriers:  Chronic Disease Management support and education needs related to CHF and DMII [name] attended dialysis today, reports her BP was low afterwards and improved after eating carrots. She is working to establish care with a new PCP, does not have an appointment, reports that she needs someone to go with her and her children both work during the day. RNCM unable to speak with patient's daughter today, she was not available per [name]. Patient reports receiving PCS and "Candy" comes on the weekend to help with bathing and dressing. Ms. Mulvihill had to use the bathroom and quickly got off the phone.  RNCM Clinical Goal(s):  Patient will verbalize understanding of plan for management of CHF and DMII as evidenced by patient verbalization of self monitoring activities take all medications exactly as prescribed and will call provider for medication related questions as evidenced by documentation in EMR    attend all scheduled medical appointments: Dialysis on M/W/F,  Establishing care with PCP, and rescheduling Endocrinology as evidenced by provider documentation          Interventions: Inter-disciplinary care team collaboration (see longitudinal plan of care) Evaluation of current treatment plan related to  self management and patient's adherence to plan as established by provider RNCM attempted to reach DPR, patient's son [name], the number in Epic is  no longer in service Reviewed upcoming appointment with [facility] on 12/18/21 @ 10am    Heart Failure Interventions:  (Status: Goal on track: NO.)  Long Term Goal -unable to address today. Patient had to use the bathroom and ended conversation early Wt Readings from Last 3 Encounters:  11/05/21 143 lb 15.4 oz (65.3 kg)  10/29/21 144 lb 9.6 oz (65.6 kg)  08/14/21 168 lb 1.9 oz (76.3 kg)   Basic overview and discussion of pathophysiology of Heart Failure reviewed Provided education on low sodium diet Discussed the importance of keeping all appointments with provider Assessed social determinant of health barriers  Diabetes:  (Status: Goal on track: NO.) Long Term Goal -Patient checking BS irregular, reports taking insulin every night at 8pm  Lab Results  Component Value Date   HGBA1C >15 08/14/2021   @ Assessed patient's understanding of A1c goal: <8% Provided education to patient about basic DM disease process; Counseled on importance of regular laboratory monitoring as prescribed;        Discussed plans with patient for ongoing care management follow up and provided patient with direct contact information for care management team;      Provided patient with written educational materials related to hypo and hyperglycemia and importance of correct treatment;       Review of patient status, including review of consultants reports, relevant laboratory and other test results, and medications completed;       Advised patient to reschedule missed Endocrinology appointment, needs requested labs drawn prior to scheduling  Patient Goals/Self-Care Activities: Take medications as prescribed   Attend all scheduled provider appointments Call provider office for new concerns or questions  use salt in moderation eat more whole grains, fruits and vegetables, lean meats and healthy fats dress right for the weather, hot or cold Complete requested lab work       Follow Up:  Patient agrees to Care  Plan and Follow-up.  Plan: The Managed Medicaid care management team will reach out to the patient again over the next 14 days.  Date/time of next scheduled RN care management/care coordination outreach:  12/23/21 @ 1pm  [name] RN, BSN [place]  [employer] manager

## 2021-12-12 NOTE — Patient Instructions (Addendum)
Visit Information  Latoya Walsh was given information about Medicaid Managed Care team care coordination services as a part of their [place] Medicaid benefit. Latoya Walsh verbally consented to engagement with the [facility] Managed Care team.   If you are experiencing a medical emergency, please call 911 or report to your local emergency department or urgent care.   If you have a non-emergency medical problem during routine business hours, please contact your provider's office and ask to speak with a nurse.   For questions related to your [facility], please call: [phone] or visit the homepage here: [email or web address]  If you would like to schedule transportation through your [facility], please call the following number at least 2 days in advance of your appointment: [phone]   Rides for urgent appointments can also be made after hours by calling Member Services.  Call the [employer] at [phone], at any time, 24 hours a day, 7 days a week. If you are in danger or need immediate medical attention call 911.  If you would like help to quit smoking, call 1-800-QUIT-NOW (323)263-3546) OR Espaol: 1-855-Djelo-Ya (8-341-962-2297) o para ms informacin haga clic aqu or Text READY to 200-400 to register via text  Latoya Walsh,   Please see education materials related to HF and DM provided by MyChart link. and as Advertising account planner.   The patient verbalized understanding of instructions, educational materials, and care plan provided today and agreed to receive a mailed copy of patient instructions, educational materials, and care plan.   Telephone follow up appointment with Managed Medicaid care management team member scheduled for:12/22/21 @ [place] RN, [place] RN Care  Coordinator   Following is a copy of your plan of care:  Care Plan : RN Care Manager Plan of Care  Updates made by Wilburta Montane, RN since 12/12/2021 12:00 AM     Problem: Health Management needs related to DMII and HF      Long-Range Goal: Development of Plan of Care to address Health Management needs related to DMII and HF   Start Date: 07/10/2021  Expected End Date: 01/20/2022  Priority: High  Note:   Current Barriers:  Chronic Disease Management support and education needs related to CHF and DMII Latoya Walsh attended dialysis today, reports her BP was low afterwards and improved after eating carrots. She is working to establish care with a new PCP, does not have an appointment, reports that she needs someone to go with her and her children both work during the day. RNCM unable to speak with patient's daughter today, she was not available per Latoya Walsh. Patient reports receiving PCS and "Candy" comes on the weekend to help with bathing and dressing. [name] had to use the bathroom and quickly got off the phone.  RNCM Clinical Goal(s):  Patient will verbalize understanding of plan for management of CHF and DMII as evidenced by patient verbalization of self monitoring activities take all medications exactly as prescribed and will call provider for medication related questions as evidenced by documentation in EMR    attend all scheduled medical appointments: Dialysis on M/W/F,  Establishing care with PCP, and rescheduling Endocrinology as evidenced by provider documentation          Interventions: Inter-disciplinary care team collaboration (see longitudinal plan of care) Evaluation of current treatment plan related to  self management and patient's adherence to plan as  established by provider RNCM attempted to reach DPR, patient's son [name], the number in Epic is no longer in service Reviewed upcoming appointment with [facility] on 12/18/21 @ 10am    Heart Failure Interventions:   (Status: Goal on track: NO.)  Long Term Goal -unable to address today. Patient had to use the bathroom and ended conversation early Wt Readings from Last 3 Encounters:  11/05/21 143 lb 15.4 oz (65.3 kg)  10/29/21 144 lb 9.6 oz (65.6 kg)  08/14/21 168 lb 1.9 oz (76.3 kg)   Basic overview and discussion of pathophysiology of Heart Failure reviewed Provided education on low sodium diet Discussed the importance of keeping all appointments with provider Assessed social determinant of health barriers  Diabetes:  (Status: Goal on track: NO.) Long Term Goal -Patient checking BS irregular, reports taking insulin every night at 8pm  Lab Results  Component Value Date   HGBA1C >15 08/14/2021   @ Assessed patient's understanding of A1c goal: <8% Provided education to patient about basic DM disease process; Counseled on importance of regular laboratory monitoring as prescribed;        Discussed plans with patient for ongoing care management follow up and provided patient with direct contact information for care management team;      Provided patient with written educational materials related to hypo and hyperglycemia and importance of correct treatment;       Review of patient status, including review of consultants reports, relevant laboratory and other test results, and medications completed;       Advised patient to reschedule missed Endocrinology appointment, needs requested labs drawn prior to scheduling   Patient Goals/Self-Care Activities: Take medications as prescribed   Attend all scheduled provider appointments Call provider office for new concerns or questions  use salt in moderation eat more whole grains, fruits and vegetables, lean meats and healthy fats dress right for the weather, hot or cold Complete requested lab work

## 2021-12-13 DIAGNOSIS — N179 Acute kidney failure, unspecified: Secondary | ICD-10-CM | POA: Diagnosis not present

## 2021-12-14 DIAGNOSIS — N179 Acute kidney failure, unspecified: Secondary | ICD-10-CM | POA: Diagnosis not present

## 2021-12-17 ENCOUNTER — Other Ambulatory Visit: Payer: Self-pay

## 2021-12-17 NOTE — Patient Instructions (Signed)
[name] ,   The [facility] Managed Care Team is available to provide assistance to you with your healthcare needs at no cost and as a benefit of your [facility] plan. I'm sorry I was unable to reach you today for our scheduled appointment. Our care guide will call you to reschedule our telephone appointment. Please call me at the number below. I am available to be of assistance to you regarding your healthcare needs. .   Thank you,   Eula Fried, BSW, MSW, LCSW Managed Medicaid [email or web address] Phone: [phone]

## 2021-12-17 NOTE — Patient Outreach (Signed)
  Medicaid Managed Care   Unsuccessful Attempt Note   12/17/2021 Name: Latoya Walsh MRN: [id number] DOB: 06/25/1960  Referred by: Pcp, No Reason for referral : High Risk Managed Medicaid   An unsuccessful telephone outreach was attempted today. The patient was referred to the case management team for assistance with care management and care coordination.    Follow Up Plan: A HIPAA compliant phone message was left for the patient providing contact information and requesting a return call.   Eula Fried, BSW, MSW, [employer] Managed Medicaid [email or web address] Phone: [phone]

## 2021-12-18 ENCOUNTER — Ambulatory Visit (INDEPENDENT_AMBULATORY_CARE_PROVIDER_SITE_OTHER): Payer: Medicaid Other | Admitting: Podiatry

## 2021-12-18 DIAGNOSIS — M79675 Pain in left toe(s): Secondary | ICD-10-CM

## 2021-12-18 DIAGNOSIS — B351 Tinea unguium: Secondary | ICD-10-CM

## 2021-12-18 DIAGNOSIS — M79674 Pain in right toe(s): Secondary | ICD-10-CM | POA: Diagnosis not present

## 2021-12-19 ENCOUNTER — Telehealth: Payer: Self-pay

## 2021-12-19 NOTE — Telephone Encounter (Signed)
..   Medicaid Managed Care   Unsuccessful Outreach Note  12/19/2021 Name: Latoya Walsh MRN: [id number] DOB: [birth year]  Referred by: Pcp, No Reason for referral : High Risk Managed Medicaid (I called the patient today to get her rescheduled with the MM LCSW. Someone answered the phone but then they disconnected the call. No answer when I called back.)   A second unsuccessful telephone outreach was attempted today. The patient was referred to the case management team for assistance with care management and care coordination.   Follow Up Plan: The care management team will reach out to the patient again over the next 7 days.   [place]

## 2021-12-20 DIAGNOSIS — N186 End stage renal disease: Secondary | ICD-10-CM | POA: Diagnosis not present

## 2021-12-20 DIAGNOSIS — N179 Acute kidney failure, unspecified: Secondary | ICD-10-CM | POA: Diagnosis not present

## 2021-12-20 DIAGNOSIS — E1122 Type 2 diabetes mellitus with diabetic chronic kidney disease: Secondary | ICD-10-CM | POA: Diagnosis not present

## 2021-12-20 DIAGNOSIS — Z992 Dependence on renal dialysis: Secondary | ICD-10-CM | POA: Diagnosis not present

## 2021-12-20 NOTE — Progress Notes (Signed)
  Subjective:  Patient ID: Latoya Walsh, female    DOB: [birth year],  MRN: [id number]  Chief Complaint  Patient presents with   Diabetes    [facility]- nail trim     61 y.o. female presents with the above complaint. History confirmed with patient.  Patient presents for diabetic foot care and nail trim.  She had history of type 2 diabetes with neuropathy.  Unable to trim her nails herself.  They do cause her pain due to the length and thickness.  Objective:  Physical Exam: warm, good capillary refill, nail exam onychomycosis of the toenails, onycholysis, and dystrophic nails, no trophic changes or ulcerative lesions. DP pulses palpable, PT pulses palpable, and protective sensation absent Left Foot: normal exam, no swelling, tenderness, instability; ligaments intact, full range of motion of all ankle/foot joints  Right Foot: normal exam, no swelling, tenderness, instability; ligaments intact, full range of motion of all ankle/foot joints   No images are attached to the encounter.  Assessment:   1. Pain due to onychomycosis of toenails of both feet      Plan:  Patient was evaluated and treated and all questions answered.  Onychomycosis with pain  -Nails palliatively debrided as below. -Educated on self-care  Procedure: Nail Debridement Rationale: Pain Type of Debridement: manual, sharp debridement. Instrumentation: Nail nipper, rotary burr. Number of Nails: 10  Return in about 3 months (around 03/19/2022) for [facility].         Everitt Amber, DPM [facility]

## 2021-12-21 DIAGNOSIS — N179 Acute kidney failure, unspecified: Secondary | ICD-10-CM | POA: Diagnosis not present

## 2021-12-23 ENCOUNTER — Other Ambulatory Visit: Payer: Self-pay | Admitting: *Deleted

## 2021-12-23 NOTE — Patient Outreach (Signed)
Medicaid Managed Care   Nurse Care Manager Note  12/23/2021 Name:  Latoya Walsh MRN:  [id number] DOB:  09-Oct-1960  Latoya Walsh is an 61 y.o. year old female who is a primary patient of Pcp, No.  The Medicaid Managed Care Coordination team was consulted for assistance with:    CHF DMII  Latoya Walsh was given information about Medicaid Managed Care Coordination team services today. Latoya Walsh Patient agreed to services and verbal consent obtained.  Engaged with patient by telephone for follow up visit in response to provider referral for case management and/or care coordination services.   Assessments/Interventions:  Review of past medical history, allergies, medications, health status, including review of consultants reports, laboratory and other test data, was performed as part of comprehensive evaluation and provision of chronic care management services.  SDOH (Social Determinants of Health) assessments and interventions performed: SDOH Interventions    Flowsheet Row Patient Outreach Telephone from 08/21/2021 in [place] Patient Outreach Telephone from 07/31/2021 in [place] Patient Outreach Telephone from 07/10/2021 in [place] Patient Outreach Telephone from 06/26/2021 in [place] Patient Outreach Telephone from 02/28/2021 in [place] Patient Outreach Telephone from 02/06/2021 in [place] Interventions        Food Insecurity Interventions -- Other (Comment)  [Advised patient to recertify for food benefits] -- -- -- Intervention Not Indicated  [Patient has ample amount of food at this time.]  Housing Interventions -- -- Intervention Not Indicated  [Patient is currently living with her daughter] -- -- Other (Comment)   Engineer, maintenance Guide referral for assistance with heat]  Transportation Interventions -- Other (Comment)  [Provided patient with UHC transportation [phone] -- -- -- --  Depression Interventions/Treatment  -- -- -- Medication --  [Referral to MM LCSW, [place]] --  Stress Interventions Provide Counseling, [employer] -- -- -- -- --       Care Plan  Allergies  Allergen Reactions   Penicillins Itching and Rash    Medications Reviewed Today     Reviewed by Fantashia Montane, RN (Registered Nurse) on 12/23/21 at [place] List Status: <None>   Medication Order Taking? Sig Documenting Provider Last Dose Status Informant  ACCU-CHEK GUIDE test strip [id number] No Use to check glucose twice daily [name], NP Taking Active Self, Pharmacy Records  Accu-Chek Softclix Lancets lancets [id number] No 2 (two) times daily. [provider] Taking Active Self, Pharmacy Records  albuterol (VENTOLIN HFA) 108 (90 Base) MCG/ACT inhaler [id number] No Inhale 2 puffs into the lungs every 4 (four) hours as needed for wheezing or shortness of breath. Manuella Ghazi, Pratik D, DO Taking Active   amLODipine (NORVASC) 10 MG tablet [id number] No Take 10 mg by mouth daily. [provider] Taking Active Self, Pharmacy Records           Med Note [facility]   Mon Nov 03, 2021  2:35 PM) LF:10/08/2021 10 MG TABS (disp 30, 30d supply)   betamethasone dipropionate 0.05 % cream [id number] No Apply topically 2 (two) times daily. [name], MD Taking Active Self, Pharmacy Records  blood glucose meter kit and supplies [id number] No Dispense based on patient and insurance preference. Use to monitor glucose twice daily. [name], NP Taking Active Self, Pharmacy Records  calcitRIOL (ROCALTROL) 0.25 MCG capsule [id number] No Take 0.5  mcg by mouth daily. [provider] Taking Active Self, Pharmacy Records           Med Note [facility]   Mon Nov 03, 2021  2:36 PM)  LF:10/08/2021 0.25 MCG CAPS (disp 60, 30d supply)   Cholecalciferol (VITAMIN D3) 25 MCG (1000 UT) CAPS [id number] No Take 1 capsule by mouth daily. [provider] Taking Active Self, Pharmacy Records           Med Note [facility]   Mon Nov 03, 2021  2:36 PM) LF:10/08/2021 25 MCG (1000 UT) CAPS (disp 30, 30d supply)   furosemide (LASIX) 80 MG tablet [id number] No Take 80 mg  (1 tablet) in the morning and 40 mg (half tablet) in the afternoon. [name], MD Taking Active Self, Pharmacy Records           Med Note [facility]   Mon Nov 03, 2021  2:36 PM) LF:10/08/2021 40 MG TABS (disp 90, 30d supply)   gabapentin (NEURONTIN) 100 MG capsule [id number] No TAKE ONE CAPSULE BY MOUTH EVERY MORNING and TAKE ONE CAPSULE BY MOUTH EVERYDAY AT BEDTIME [name], MD Taking Active Self, Pharmacy Records           Med Note [facility]   Mon Nov 03, 2021  2:36 PM) LF:10/08/2021 100 MG CAPS (disp 60, 30d supply)   glipiZIDE (GLUCOTROL XL) 5 MG 24 hr tablet [id number] No TAKE ONE TABLET BY MOUTH EVERY MORNING [name], MD Taking Active Self, Pharmacy Records           Med Note [facility]   Mon Nov 03, 2021  2:37 PM) LF:09/10/2021 5 MG TB24 (disp 30, 30d supply)   guaiFENesin-dextromethorphan (ROBITUSSIN DM) 100-10 MG/5ML syrup [id number] No Take 10 mLs by mouth every 4 (four) hours as needed for cough. [name], DO Taking Active   hydrALAZINE (APRESOLINE) 50 MG tablet [id number] No TAKE ONE TABLET BY MOUTH THREE TIMES DAILY [name], MD Taking Active Self, Pharmacy Records           Med Note [facility]   Mon Nov 03, 2021  2:37 PM) LF:10/08/2021 50 MG TABS (disp 90, 30d supply)   hydrOXYzine (VISTARIL) 50 MG capsule [id number] No TAKE ONE CAPSULE EVERYDAY AT BEDTIME FOR SLEEP [name], MD Taking Active Self, Pharmacy Records           Med Note [facility]   Mon Nov 03, 2021  2:37 PM) LF:10/08/2021 50 MG CAPS (disp 30, 30d supply)    insulin glargine (LANTUS) 100 UNIT/ML Solostar Pen [id number] No Inject 35 Units into the skin at bedtime. [name], NP Taking Active Self, Pharmacy Records           Med Note [facility]   Mon Nov 03, 2021  2:37 PM) LF:04/15/2021 100 UNIT/ML SOPN (disp 12, 34d supply)   Insulin Pen Needle (PEN NEEDLES) 31G X 5 MM MISC [id number] No Use to inject insulin once daily [name], NP Taking Active Self, Pharmacy Records  isosorbide mononitrate (IMDUR) 30 MG 24 hr tablet [id number] No TAKE ONE TABLET BY MOUTH EVERY MORNING Branch, [name], MD Taking Active Self, Pharmacy Records           Med Note [facility]   Mon Nov 03, 2021  2:37 PM) LF:10/08/2021 30 MG TB24 (disp 30, 30d supply)   LEVEMIR FLEXTOUCH 100 UNIT/ML FlexTouch Pen [id number] No INJECT 35 UNITS into THE SKIN AT  BEDTIME [name], MD Taking Active Self, Pharmacy Records           Med Note [facility]   Mon Nov 03, 2021  2:37 PM) LF:08/12/2021 100 UNIT/ML SOPN (disp 12, 34d supply)   metoprolol succinate (TOPROL-XL) 50 MG 24 hr tablet [id number] No TAKE ONE TABLET BY MOUTH EVERY EVENING Branch, [name], MD Taking Active Self, Pharmacy Records           Med Note [facility]   Mon Nov 03, 2021  2:37 PM) LF:10/08/2021 50 MG TB24 (disp 30, 30d supply)   mirtazapine (REMERON) 15 MG tablet [id number] No TAKE ONE TABLET BY MOUTH EVERY EVENING [name], MD Taking Active Self, Pharmacy Records           Med Note [facility]   Mon Nov 03, 2021  2:38 PM) LF:10/08/2021 15 MG TABS (disp 30, 30d supply)   oxyCODONE-acetaminophen (PERCOCET) 5-325 MG tablet [id number] No Take 1 tablet by mouth every 6 (six) hours as needed for severe pain. [name], MD Taking Active Self, Pharmacy Records           Med Note [facility]   Mon Nov 03, 2021  2:38 PM) LF:08/05/2021  5-325 MG TABS (disp 8, 2d supply)  rosuvastatin (CRESTOR) 10 MG tablet [id number] No TAKE ONE TABLET BY MOUTH EVERY MORNING  Branch, [name], MD Taking Active Self, Pharmacy Records           Med Note [address]   Mon Nov 03, 2021  2:38 PM) LF:10/08/2021 10 MG TABS (disp 30, 30d supply)   sevelamer carbonate (RENVELA) 800 MG tablet [id number] No Take 800 mg by mouth 3 (three) times daily with meals. [provider] Taking Active Self, Pharmacy Records           Med Note [facility]   Mon Nov 03, 2021  2:38 PM) LF:10/30/2021 800 MG TABS (disp 42, 14d supply)   sodium bicarbonate 650 MG tablet [id number] No Take 650 mg by mouth 3 (three) times daily. [provider] Taking Active Self, Pharmacy Records           Med Note [address]   Mon Nov 03, 2021  2:38 PM) LF:10/08/2021 650 MG TABS (disp 90, 30d supply)   Med List Note [name], [place] [birth year] 0962):              Patient Active Problem List   Diagnosis Date Noted   Sepsis ([place]) 11/03/2021   ESRD (end stage renal disease) ([place]) 01/06/2021   CHF (congestive heart failure) ([place]) 06/06/2020   Leg swelling 04/28/2020   Hyperlipidemia LDL goal <100 04/28/2020   Left hand pain 03/10/2020   Anemia 02/19/2020   COVID-19 virus infection 12/26/2019   Depression, major, single episode, in partial remission ([place]) 01/25/2019   Headache 11/13/2018   Insomnia 09/25/2018   Screening for colorectal cancer 07/23/2016   Lumbar back pain with radiculopathy affecting right lower extremity 03/27/2015   Non compliance with medical treatment 07/22/2014   Chronic hepatitis C without hepatic coma ([place]) 12/04/2013   Allergic rhinitis 11/03/2012   Severe hypertension 06/25/2011   Anxiety and depression 02/05/2011   Nicotine dependence 11/01/2010   Dermatitis 10/30/2010   Drug dependence, continuous abuse ([place]) 03/22/2010   Uncontrolled type 2 diabetes mellitus with hyperglycemia ([place]) 06/02/2007   Alcohol abuse 06/02/2007    Conditions to be addressed/monitored per PCP order:  CHF and DMII  Care Plan : RN  Care Manager Plan of  Care  Updates made by Kaena Montane, RN since 12/23/2021 12:00 AM     Problem: Health Management needs related to DMII and HF      Long-Range Goal: Development of Plan of Care to address Health Management needs related to DMII and HF   Start Date: 07/10/2021  Expected End Date: 03/23/2022  Priority: High  Note:   Current Barriers:  Chronic Disease Management support and education needs related to CHF and DMII Latoya Walsh reports feeling good today. 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 She has not established care with a new PCP at this time.   RNCM Clinical Goal(s):  Patient will verbalize understanding of plan for management of CHF and DMII as evidenced by patient verbalization of self monitoring activities take all medications exactly as prescribed and will call provider for medication related questions as evidenced by documentation in EMR    attend all scheduled medical appointments: Dialysis on M/W/F,  Establishing care with PCP, and rescheduling Endocrinology as evidenced by provider documentation          Interventions: Inter-disciplinary care team collaboration (see longitudinal plan of care) Evaluation of current treatment plan related to  self management and patient's adherence to plan as established by provider RNCM assisted with calling [phone] to schedule with a new PCP, appointment made for 01/01/22 @ 10am, patient agreed to this appointment and requested RNCM to contact her sister/Candy and make aware of appointment Discussed visit with Candy with Latoya Walsh verbal permission, provided [facility] transportation [phone], RNCM request Candy to accompany Latoya Walsh to her PCP appointment  Advised patient to contact [place] for member benefits(free fresh fruits and vegetables and $75 reward) Rescheduled missed telephone visit with Sanjuan Dame for 12/25/21 @ 11am    Heart Failure  Interventions:  (Status: Goal on track: NO.)  Long Term Goal  Wt Readings from Last 3 Encounters:  11/05/21 143 lb 15.4 oz (65.3 kg)  10/29/21 144 lb 9.6 oz (65.6 kg)  08/14/21 168 lb 1.9 oz (76.3 kg)   Basic overview and discussion of pathophysiology of Heart Failure reviewed Provided education on low sodium diet Discussed the importance of keeping all appointments with provider Assessed social determinant of health barriers  Diabetes:  (Status: Goal on track: NO.) Long Term Goal -Patient checking BS irregular, reports taking insulin every night at 8pm  Lab Results  Component Value Date   HGBA1C >15 08/14/2021   @ Assessed patient's understanding of A1c goal: <8% Provided education to patient about basic DM disease process; Counseled on importance of regular laboratory monitoring as prescribed;        Discussed plans with patient for ongoing care management follow up and provided patient with direct contact information for care management team;      Provided patient with written educational materials related to hypo and hyperglycemia and importance of correct treatment;       Reviewed scheduled/upcoming provider appointments including: 01/01/22 with [facility] and 02/03/22 with GYN;         Review of patient status, including review of consultants reports, relevant laboratory and other test results, and medications completed;       Advised patient to discuss needing a new glucometer during PCP visit   Patient Goals/Self-Care Activities: Take medications as prescribed   Attend all scheduled provider appointments Call provider office for new concerns or  questions  use salt in moderation eat more whole grains, fruits and vegetables, lean meats and healthy fats dress right for the weather, hot or cold Complete requested lab work       Follow Up:  Patient agrees to Care Plan and Follow-up.  Plan: The Managed Medicaid care management team will reach  out to the patient again over the next 14 days.  Date/time of next scheduled RN care management/care coordination outreach:  01/08/22 @ 2:30pm  [name] RN, BSN [place] RN Care Coordinator

## 2021-12-23 NOTE — Patient Instructions (Signed)
Visit Information  Latoya Walsh was given information about Medicaid Managed Care team care coordination services as a part of their [place] Medicaid benefit. Latoya Walsh verbally consented to engagement with the [facility] Managed Care team.   If you are experiencing a medical emergency, please call 911 or report to your local emergency department or urgent care.   If you have a non-emergency medical problem during routine business hours, please contact your provider's office and ask to speak with a nurse.   For questions related to your [facility], please call: [phone] or visit the homepage here: [email or web address]  If you would like to schedule transportation through your [facility], please call the following number at least 2 days in advance of your appointment: [phone]   Rides for urgent appointments can also be made after hours by calling Member Services.  Call the [place] at [phone], at any time, 24 hours a day, 7 days a week. If you are in danger or need immediate medical attention call 911.  If you would like help to quit smoking, call 1-800-QUIT-NOW (405)429-9746) OR Espaol: 1-855-Djelo-Ya (1-962-229-7989) o para ms informacin haga clic aqu or Text READY to 200-400 to register via text  Latoya Walsh,   Please see education materials related to CHF and Diabetes provided as print materials.   The patient verbalized understanding of instructions, educational materials, and care plan provided today and agreed to receive a mailed copy of patient instructions, educational materials, and care plan.   Telephone follow up appointment with Managed Medicaid care management team member scheduled for:01/08/22 @ 2:30pm  [name] RN, BSN [place] RN Care Coordinator   Following is a  copy of your plan of care:  Care Plan : RN Care Manager Plan of Care  Updates made by Anyjah Montane, RN since 12/23/2021 12:00 AM     Problem: Health Management needs related to DMII and HF      Long-Range Goal: Development of Plan of Care to address Health Management needs related to DMII and HF   Start Date: 07/10/2021  Expected End Date: 03/23/2022  Priority: High  Note:   Current Barriers:  Chronic Disease Management support and education needs related to CHF and DMII Latoya Walsh reports feeling good today. 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 She has not established care with a new PCP at this time.   RNCM Clinical Goal(s):  Patient will verbalize understanding of plan for management of CHF and DMII as evidenced by patient verbalization of self monitoring activities take all medications exactly as prescribed and will call provider for medication related questions as evidenced by documentation in EMR    attend all scheduled medical appointments: Dialysis on M/W/F,  Establishing care with PCP, and rescheduling Endocrinology as evidenced by provider documentation          Interventions: Inter-disciplinary care team collaboration (see longitudinal plan of care) Evaluation of current treatment plan related to  self management and patient's adherence to plan as established by provider RNCM assisted with calling [phone] to schedule with a new PCP, appointment made for 01/01/22 @ 10am, patient agreed to this appointment and requested RNCM to contact her sister/Latoya Walsh and make aware of appointment Discussed visit with Latoya Walsh with Latoya Walsh verbal permission, provided [facility] transportation [phone], RNCM request Latoya Walsh to accompany Latoya Walsh  to her PCP appointment  Advised patient to contact [place] for member benefits(free fresh fruits and vegetables and $75 reward) Rescheduled missed telephone  visit with Latoya, Walsh for 12/25/21 @ 11am    Heart Failure Interventions:  (Status: Goal on track: NO.)  Long Term Goal  Wt Readings from Last 3 Encounters:  11/05/21 143 lb 15.4 oz (65.3 kg)  10/29/21 144 lb 9.6 oz (65.6 kg)  08/14/21 168 lb 1.9 oz (76.3 kg)   Basic overview and discussion of pathophysiology of Heart Failure reviewed Provided education on low sodium diet Discussed the importance of keeping all appointments with provider Assessed social determinant of health barriers  Diabetes:  (Status: Goal on track: NO.) Long Term Goal -Patient checking BS irregular, reports taking insulin every night at 8pm  Lab Results  Component Value Date   HGBA1C >15 08/14/2021   @ Assessed patient's understanding of A1c goal: <8% Provided education to patient about basic DM disease process; Counseled on importance of regular laboratory monitoring as prescribed;        Discussed plans with patient for ongoing care management follow up and provided patient with direct contact information for care management team;      Provided patient with written educational materials related to hypo and hyperglycemia and importance of correct treatment;       Reviewed scheduled/upcoming provider appointments including: [facility] and 02/03/22 with GYN;         Review of patient status, including review of consultants reports, relevant laboratory and other test results, and medications completed;       Advised patient to discuss needing a new glucometer during PCP visit   Patient Goals/Self-Care Activities: Take medications as prescribed   Attend all scheduled provider appointments Call provider office for new concerns or questions  use salt in moderation eat more whole grains, fruits and vegetables, lean meats and healthy fats dress right for the weather, hot or cold Complete requested lab work

## 2021-12-25 ENCOUNTER — Other Ambulatory Visit: Payer: Self-pay | Admitting: Licensed Clinical Social Worker

## 2021-12-25 NOTE — Patient Instructions (Signed)
Visit Information  Ms. Villaflor was given information about Medicaid Managed Care team care coordination services as a part of their [place] Medicaid benefit. [name] verbally consented to engagement with the [facility] Managed Care team.   If you are experiencing a medical emergency, please call 911 or report to your local emergency department or urgent care.   If you have a non-emergency medical problem during routine business hours, please contact your provider's office and ask to speak with a nurse.   For questions related to your [facility], please call: [phone] or visit the homepage here: [email or web address]  If you would like to schedule transportation through your [facility], please call the following number at least 2 days in advance of your appointment: [phone]   Rides for urgent appointments can also be made after hours by calling Member Services.  Call the [place] at [phone], at any time, 24 hours a day, 7 days a week. If you are in danger or need immediate medical attention call 911.  If you would like help to quit smoking, call 1-800-QUIT-NOW 915-696-4395) OR Espaol: 1-855-Djelo-Ya (7-253-664-4034) o para ms informacin haga clic aqu or Text READY to 200-400 to register via text   Following is a copy of your plan of care:  Care Plan : General Social Work (Adult)  Updates made by Greg Cutter, LCSW since 12/25/2021 12:00 AM     Problem: I am experiencing grief symptoms   Priority: High  Onset Date: 05/15/2021  Note:   CARE PLAN ENTRY (see longitudinal plan of care for additional care plan information)    Priority: High  Timeframe:  Long-Range Goal Priority:  High Start Date:   12/02/21     Expected End Date:  ongoing         Follow Up Date--None. Goal has been closed as multiple  referrals have been made and grief management coping skill and resource education has been provided. UPDATE case re-opened.  --Follow up date is 01/08/22 at 2 30 pm  Current Barriers:  Knowledge deficits related to accessing mental health provider in patient with Depression  Patient is experiencing symptoms of  depression which seem to be exacerbated by the loss of her sister.     Patient needs Support, Education, and Care Coordination in order to meet unmet mental health needs  Mental Health Concerns   Clinical Social Work Goal(s):  Over the next 90 days, patient will work with SW and Engineer, drilling by telephone or in person to reduce or manage symptoms of depression and grief Patient will implement clinical interventions discussed today to decreases symptoms of depression and increase knowledge and/or ability of: coping skills.  Patient Self Care Activities & Deficits:  Patient is unable to independently navigate community resource options without care coordination support Patient is able to implement clinical interventions discussed today and is motivated for treatment  Patient will select one of the agencies from the list provided and call to schedule an appointment Performs ADL's independently Performs IADL's independently Strong family or social support  10 LITTLE Things To Do When You're Feeling Too Down To Do Anything  Take a shower. Even if you plan to stay in all day long and not see a soul, take a shower. It takes the most effort to hop in to the shower but once you do, you'll feel immediate results. It will wake you up and you'll be feeling  much fresher (and cleaner too).  Brush and floss your teeth. Give your teeth a good brushing with a floss finish. It's a small task but it feels so good and you can check 'taking care of your health' off the list of things to do.  Do something small on your list. Most of [place] have some small thing we would like to get done  (load of laundry, sew a button, email a friend). Doing one of these things will make you feel like you've accomplished something.  Drink water. Drinking water is easy right? It's also really beneficial for your health so keep a glass beside you all day and take sips often. It gives you energy and prevents you from boredom eating.  Do some floor exercises. The last thing you want to do is exercise but it might be just the thing you need the most. Keep it simple and do exercises that involve sitting or laying on the floor. Even the smallest of exercises release chemicals in the brain that make you feel good. Yoga stretches or core exercises are going to make you feel good with minimal effort.  Make your bed. Making your bed takes a few minutes but it's productive and you'll feel relieved when it's done. An unmade bed is a huge visual reminder that you're having an unproductive day. Do it and consider it your housework for the day.  Put on some nice clothes. Take the sweatpants off even if you don't plan to go anywhere. Put on clothes that make you feel good. Take a look in the mirror so your brain recognizes the sweatpants have been replaced with clothes that make you look great. It's an instant confidence booster.  Wash the dishes. A pile of dirty dishes in the sink is a reflection of your mood. It's possible that if you wash up the dishes, your mood will follow suit. It's worth a try.  Cook a real meal. If you have the luxury to have a "do nothing" day, you have time to make a real meal for yourself. Make a meal that you love to eat. The process is good to get you out of the funk and the food will ensure you have more energy for tomorrow.  Write out your thoughts by hand. When you hand write, you stimulate your brain to focus on the moment that you're in so make yourself comfortable and write whatever comes into your mind. Put those thoughts out on paper so they stop spinning around in your  head. Those thoughts might be the very thing holding you down.  Please see past updates related to this goal by clicking on the "Past Updates" button in the selected goal

## 2021-12-25 NOTE — Patient Outreach (Signed)
Medicaid Managed Care Social Work Note  12/25/2021 Name:  Latoya Walsh MRN:  [id number] DOB:  Aug 05, 1960  Latoya Walsh is an 61 y.o. year old female who is a primary patient of Pcp, No.  The Medicaid Managed Care Coordination team was consulted for assistance with:  Latoya Walsh and Resources  Latoya Walsh was given information about Medicaid Managed Care Coordination team services today. Latoya Walsh Patient agreed to services and verbal consent obtained.  Engaged with patient  for by telephone forfollow up visit in response to referral for case management and/or care coordination services.   Assessments/Interventions:  Review of past medical history, allergies, medications, health status, including review of consultants reports, laboratory and other test data, was performed as part of comprehensive evaluation and provision of chronic care management services.  SDOH: (Social Determinant of Health) assessments and interventions performed: SDOH Interventions    Flowsheet Row Patient Outreach Telephone from 08/21/2021 in [place] Patient Outreach Telephone from 07/31/2021 in [place] Patient Outreach Telephone from 07/10/2021 in [place] Patient Outreach Telephone from 06/26/2021 in [place] Patient Outreach Telephone from 02/28/2021 in [place] Patient Outreach Telephone from 02/06/2021 in [place] Coordination  SDOH Interventions        Food Insecurity Interventions -- Other (Comment)  [Advised patient to recertify for food benefits] -- -- -- Intervention Not Indicated  [Patient has ample amount of food at this time.]  Housing Interventions -- -- Intervention Not Indicated  [Patient is currently living with her daughter] -- -- Other  (Comment)  Engineer, maintenance Guide referral for assistance with heat]  Transportation Interventions -- Other (Comment)  [Provided patient with UHC transportation [phone] -- -- -- --  Depression Interventions/Treatment  -- -- -- Medication --  [Referral to MM LCSW, Latoya Walsh] --  Stress Interventions Provide Counseling, [employer] -- -- -- -- --       Advanced Directives Status:  See Care Plan for related entries.  Care Plan                 Allergies  Allergen Reactions   Penicillins Itching and Rash    Medications Reviewed Today     Reviewed by Latoya Walsh (Registered Nurse) on 12/23/21 at 50  Med List Status: <None>   Medication Order Taking? Sig Documenting Provider Last Dose Status Informant  ACCU-CHEK GUIDE test strip [id number] No Use to check glucose twice daily Latoya Walsh Taking Active Self, Pharmacy Records  Accu-Chek Softclix Lancets lancets [id number] No 2 (two) times daily. [provider] Taking Active Self, Pharmacy Records  albuterol (VENTOLIN HFA) 108 (90 Base) MCG/ACT inhaler [id number] No Inhale 2 puffs into the lungs every 4 (four) hours as needed for wheezing or shortness of breath. Latoya Walsh, Latoya Walsh Taking Active   amLODipine (NORVASC) 10 MG tablet [id number] No Take 10 mg by mouth daily. [provider] Taking Active Self, Pharmacy Records           Med Note Latoya Walsh [facility]   Mon Nov 03, 2021  2:35 PM) LF:10/08/2021 10 MG TABS (disp 30, 30d supply)   betamethasone dipropionate 0.05 % cream [id number] No Apply topically 2 (two) times daily. Latoya Helper, MD Taking Active Self, Pharmacy Records  blood glucose meter kit and supplies [id number] No Dispense based on patient and insurance preference. Use  to monitor glucose twice daily. Latoya Walsh Taking Active Self, Pharmacy Records  calcitRIOL (ROCALTROL) 0.25 MCG capsule [id number] No Take 0.5 mcg by mouth daily. [provider] Taking  Active Self, Pharmacy Records           Med Note Latoya Walsh [facility]   Mon Nov 03, 2021  2:36 PM) LF:10/08/2021 0.25 MCG CAPS (disp 60, 30d supply)   Cholecalciferol (VITAMIN D3) 25 MCG (1000 UT) CAPS [id number] No Take 1 capsule by mouth daily. [provider] Taking Active Self, Pharmacy Records           Med Note Latoya Walsh [facility]   Mon Nov 03, 2021  2:36 PM) LF:10/08/2021 25 MCG (1000 UT) CAPS (disp 30, 30d supply)   furosemide (LASIX) 80 MG tablet [id number] No Take 80 mg  (1 tablet) in the morning and 40 mg (half tablet) in the afternoon. Latoya Lenis, MD Taking Active Self, Pharmacy Records           Med Note Latoya Walsh   Mon Nov 03, 2021  2:36 PM) LF:10/08/2021 40 MG TABS (disp 90, 30d supply)   gabapentin (NEURONTIN) 100 MG capsule [id number] No TAKE ONE CAPSULE BY MOUTH EVERY MORNING and TAKE ONE CAPSULE BY MOUTH EVERYDAY AT BEDTIME Latoya Helper, MD Taking Active Self, Pharmacy Records           Med Note Latoya Walsh   Mon Nov 03, 2021  2:36 PM) LF:10/08/2021 100 MG CAPS (disp 60, 30d supply)   glipiZIDE (GLUCOTROL XL) 5 MG 24 hr tablet [id number] No TAKE ONE TABLET BY MOUTH EVERY MORNING Latoya Helper, MD Taking Active Self, Pharmacy Records           Med Note Latoya Walsh   Mon Nov 03, 2021  2:37 PM) LF:09/10/2021 5 MG TB24 (disp 30, 30d supply)   guaiFENesin-dextromethorphan (ROBITUSSIN DM) 100-10 MG/5ML syrup [id number] No Take 10 mLs by mouth every 4 (four) hours as needed for cough. Latoya Lark Walsh, Walsh Taking Active   hydrALAZINE (APRESOLINE) 50 MG tablet [id number] No TAKE ONE TABLET BY MOUTH THREE TIMES DAILY Latoya Spar, MD Taking Active Self, Pharmacy Records           Med Note Latoya Walsh   Mon Nov 03, 2021  2:37 PM) LF:10/08/2021 50 MG TABS (disp 90, 30d supply)   hydrOXYzine (VISTARIL) 50 MG capsule [id number] No TAKE ONE CAPSULE EVERYDAY AT BEDTIME FOR SLEEP Latoya Helper, MD Taking Active Self, Pharmacy Records           Med Note  Latoya Walsh   Mon Nov 03, 2021  2:37 PM) LF:10/08/2021 50 MG CAPS (disp 30, 30d supply)   insulin glargine (LANTUS) 100 UNIT/ML Solostar Pen [id number] No Inject 35 Units into the skin at bedtime. Latoya Walsh Taking Active Self, Pharmacy Records           Med Note Latoya Walsh   Mon Nov 03, 2021  2:37 PM) LF:04/15/2021 100 UNIT/ML SOPN (disp 12, 34d supply)   Insulin Pen Needle (PEN NEEDLES) 31G X 5 MM MISC [id number] No Use to inject insulin once daily Latoya Walsh Taking Active Self, Pharmacy Records  isosorbide mononitrate (IMDUR) 30 MG 24 hr tablet [id number] No TAKE ONE TABLET BY MOUTH EVERY MORNING Branch, [name], MD Taking Active Self, Pharmacy Records           Med Note Latoya Walsh   Mon Nov 03, 2021  2:37 PM) LF:10/08/2021  30 MG TB24 (disp 30, 30d supply)   LEVEMIR FLEXTOUCH 100 UNIT/ML FlexTouch Pen [id number] No INJECT 35 UNITS into THE SKIN AT BEDTIME [name], MD Taking Active Self, Pharmacy Records           Med Note Latoya Walsh   Mon Nov 03, 2021  2:37 PM) LF:08/12/2021 100 UNIT/ML SOPN (disp 12, 34d supply)   metoprolol succinate (TOPROL-XL) 50 MG 24 hr tablet [id number] No TAKE ONE TABLET BY MOUTH EVERY EVENING Branch, [name], MD Taking Active Self, Pharmacy Records           Med Note Latoya Walsh   Mon Nov 03, 2021  2:37 PM) LF:10/08/2021 50 MG TB24 (disp 30, 30d supply)   mirtazapine (REMERON) 15 MG tablet [id number] No TAKE ONE TABLET BY MOUTH EVERY EVENING Latoya Helper, MD Taking Active Self, Pharmacy Records           Med Note Latoya Walsh   Mon Nov 03, 2021  2:38 PM) LF:10/08/2021 15 MG TABS (disp 30, 30d supply)   oxyCODONE-acetaminophen (PERCOCET) 5-325 MG tablet [id number] No Take 1 tablet by mouth every 6 (six) hours as needed for severe pain. [name], MD Taking Active Self, Pharmacy Records           Med Note Latoya Walsh [facility]   Mon Nov 03, 2021  2:38 PM) LF:08/05/2021  5-325 MG TABS (disp 8, 2d supply)   rosuvastatin (CRESTOR) 10 MG tablet [id number] No TAKE ONE TABLET BY MOUTH EVERY MORNING Branch, [name], MD Taking Active Self, Pharmacy Records           Med Note Latoya Walsh   Mon Nov 03, 2021  2:38 PM) LF:10/08/2021 10 MG TABS (disp 30, 30d supply)   sevelamer carbonate (RENVELA) 800 MG tablet [id number] No Take 800 mg by mouth 3 (three) times daily with meals. [provider] Taking Active Self, Pharmacy Records           Med Note Latoya Walsh [facility]   Mon Nov 03, 2021  2:38 PM) LF:10/30/2021 800 MG TABS (disp 42, 14d supply)   sodium bicarbonate 650 MG tablet [id number] No Take 650 mg by mouth 3 (three) times daily. [provider] Taking Active Self, Pharmacy Records           Med Note Latoya Walsh   Mon Nov 03, 2021  2:38 PM) LF:10/08/2021 650 MG TABS (disp 90, 30d supply)   Med List Note [name], [place] [birth year] 6629):              Patient Active Problem List   Diagnosis Date Noted   Sepsis ([place]) 11/03/2021   ESRD (end stage renal disease) ([place]) 01/06/2021   CHF (congestive heart failure) ([place]) 06/06/2020   Leg swelling 04/28/2020   Hyperlipidemia LDL goal <100 04/28/2020   Left hand pain 03/10/2020   Anemia 02/19/2020   COVID-19 virus infection 12/26/2019   Depression, major, single episode, in partial remission ([place]) 01/25/2019   Headache 11/13/2018   Insomnia 09/25/2018   Screening for colorectal cancer 07/23/2016   Lumbar back pain with radiculopathy affecting right lower extremity 03/27/2015   Non compliance with medical treatment 07/22/2014   Chronic hepatitis C without hepatic coma ([place]) 12/04/2013   Allergic rhinitis 11/03/2012   Severe hypertension 06/25/2011   Anxiety and depression 02/05/2011   Nicotine dependence 11/01/2010   Dermatitis 10/30/2010   Drug dependence, continuous abuse ([place]) 03/22/2010   Uncontrolled type 2 diabetes mellitus with hyperglycemia ([place]) 06/02/2007  Alcohol abuse 06/02/2007    Conditions to be  addressed/monitored per PCP order:  Depression and Grief  Care Plan : General Social Work (Adult)  Updates made by Greg Cutter, LCSW since 12/25/2021 12:00 AM     Problem: I am experiencing grief symptoms   Priority: High  Onset Date: 05/15/2021  Note:   CARE PLAN ENTRY (see longitudinal plan of care for additional care plan information)    Priority: High  Timeframe:  Long-Range Goal Priority:  High Start Date:   12/02/21     Expected End Date:  ongoing         Follow Up Date--None. Goal has been closed as multiple referrals have been made and grief management coping skill and resource education has been provided. UPDATE case re-opened.  --Follow up date is 01/08/22 at 2 30 pm  Current Barriers:  Knowledge deficits related to accessing mental health provider in patient with Depression  Patient is experiencing symptoms of  depression which seem to be exacerbated by the loss of her sister.     Patient needs Support, Education, and Care Coordination in order to meet unmet mental health needs  Mental Health Concerns   Clinical Social Work Goal(s):  Over the next 90 days, patient will work with SW and Engineer, drilling by telephone or in person to reduce or manage symptoms of depression and grief Patient will implement clinical interventions discussed today to decreases symptoms of depression and increase knowledge and/or ability of: coping skills.  Interventions:  Assessed patient's understanding, education, previous treatment and care coordination needs  Patient continues to discuss difficulty in dealing with the recent loss of her sister evidenced by depressed mood, insomnia and crying spells Patient confirms having family support, lives with her daughter at this time but is working with her Education officer, museum to find her own place. Dialysis continues on Monday, Wednesdays and Fridays-vans provides transport Patient reports that she goes to bible study weekly and watches  church sermons on Sundays. She reports that her church provides stable transportation to and from bible study during the week. Patient reports that she uses Medicaid transportation Provided basic mental health support, education and interventions  Grief response normalized, emotional support provided, grief counseling encouraged Collaborated with appropriate clinical care team members regarding patient's needs. [facility] LCSW mae referral to [facility] RNCM for chronic pain management.  Discussed  options for long term counseling based on need and insurance. Patient agreeable to ongoing mental health counseling Patient was referred to grief therapy through [facility]osp[facility] on 06/26/21 Reviewed mental health medications with patient prescribed by PCP and discussed compliance  Other interventions include: Motivational Interviewing employed Active listening / Reflection utilized  Emotional Support Provided  UPDATE 08/01/21- [facility] RNCM informed [facility] LCSW that patient had not been contacted by [facility]osp[facility] yet. [facility] LCSW contacted [facility] to inquire and left a message. [facility] LCSW received a return call from [facility] counselor and was informed that they have talked to patient twice. Patient said she had a headache and had to get off the phone the first call and the second call patient reported that she did not want bereavement counseling anymore because she was doing well. [facility] LCSW completed call to patient and provided this information. [place] LCSW  updated [facility] RNCM with final update. Patient is agreeable to grief counseling and admits she has been experiencing some confusion recently. Patient was educated on their available grief support groups as well. [facility] LCSW will follow up next month. UPDATE 08/21/21- Patient  reports that she did not complete grief therapy with [facility]. She reports that they may have left her a voice message but she has not been checking her messages. [facility] LCSW informed her that they were going to make one  last outreach to her in order to schedule her initial appointment but if they were unable to reach her at this last outreach attempt then they would not be able to accept an additional referral and would prefer for patient to contact them directly if she wishes to gain services. [facility] LCSW provided patient with their contact number which is [phone]. Patient reports that her hand hurts and she needs to get off the phone. She reports that she is not able to talk on the phone long because of the nerve pain in her hand. [facility] LCSW informed patient that [facility] LCSW will be closing her case for social work support now that grief coping skill and resource education has been provided and two referrals made for bereavement counseling. Patient expressed understanding and is agreeable. [place] LCSW will update [place] who is still involved in patient's care. UPDATE- 12/02/21- Patient is interested in gaining telephonic therapy. Case re-opened on 12/02/21 and referral made to [facility] by [facility] LCSW today on 12/02/21 as well. [facility] RNCM and BSW updated. UPDATE- [facility] LCSW completed call to patient and completed joint phone call with her to [facility] and was informed that she is currently on the wait list for services and should be getting a call within one week. [facility] LCSW asked if Candy's contact information could be placed on referral as patient gets easily confused and needs care coordination. [facility] added this contact information with patient's approval. [facility] LCSW will follow up within the month to ensure that patient was able to complete intake paperwork which may require her going to a local office of theirs to Walsh this as patient does not use the computer. [facility] LCSW will monitor this referral closely.   Patient Self Care Activities & Deficits:  Patient is unable to independently navigate community resource options without care coordination support Patient is able to implement clinical interventions discussed  today and is motivated for treatment  Patient will select one of the agencies from the list provided and call to schedule an appointment Performs ADL's independently Performs IADL's independently Strong family or social support  10 LITTLE Things To Walsh When You're Feeling Too Down To Walsh Anything  Take a shower. Even if you plan to stay in all day long and not see a soul, take a shower. It takes the most effort to hop in to the shower but once you Walsh, you'll feel immediate results. It will wake you up and you'll be feeling much fresher (and cleaner too).  Brush and floss your teeth. Give your teeth a good brushing with a floss finish. It's a small task but it feels so good and you can check 'taking care of your health' off the list of things to Walsh.  Walsh something small on your list. Most of [place] have some small thing we would like to get done (load of laundry, sew a button, email a friend). Doing one of these things will make you feel like you've accomplished something.  Drink water. Drinking water is easy right? It's also really beneficial for your health so keep a glass beside you all day and take sips often. It gives you energy and prevents you from boredom eating.  Walsh some floor exercises.  The last thing you want to Walsh is exercise but it might be just the thing you need the most. Keep it simple and Walsh exercises that involve sitting or laying on the floor. Even the smallest of exercises release chemicals in the brain that make you feel good. Yoga stretches or core exercises are going to make you feel good with minimal effort.  Make your bed. Making your bed takes a few minutes but it's productive and you'll feel relieved when it's done. An unmade bed is a huge visual reminder that you're having an unproductive day. Walsh it and consider it your housework for the day.  Put on some nice clothes. Take the sweatpants off even if you don't plan to go anywhere. Put on clothes that make you feel good.  Take a look in the mirror so your brain recognizes the sweatpants have been replaced with clothes that make you look great. It's an instant confidence booster.  Wash the dishes. A pile of dirty dishes in the sink is a reflection of your mood. It's possible that if you wash up the dishes, your mood will follow suit. It's worth a try.  Cook a real meal. If you have the luxury to have a "Walsh nothing" day, you have time to make a real meal for yourself. Make a meal that you love to eat. The process is good to get you out of the funk and the food will ensure you have more energy for tomorrow.  Write out your thoughts by hand. When you hand write, you stimulate your brain to focus on the moment that you're in so make yourself comfortable and write whatever comes into your mind. Put those thoughts out on paper so they stop spinning around in your head. Those thoughts might be the very thing holding you down.  Please see past updates related to this goal by clicking on the "Past Updates" button in the selected goal       Follow up:  Patient agrees to Care Plan and Follow-up.  Plan: The Managed Medicaid care management team will reach out to the patient again over the next 30 days.  Date/time of next scheduled Social Work care management/care coordination outreach:  01/08/22 at [place], [place], MSW, [place] Medicaid LCSW [email or web address] Phone: [phone]

## 2021-12-27 ENCOUNTER — Other Ambulatory Visit: Payer: Self-pay | Admitting: Nurse Practitioner

## 2021-12-27 DIAGNOSIS — N179 Acute kidney failure, unspecified: Secondary | ICD-10-CM | POA: Diagnosis not present

## 2021-12-28 DIAGNOSIS — N179 Acute kidney failure, unspecified: Secondary | ICD-10-CM | POA: Diagnosis not present

## 2021-12-29 ENCOUNTER — Other Ambulatory Visit: Payer: Self-pay | Admitting: Cardiology

## 2021-12-29 ENCOUNTER — Other Ambulatory Visit: Payer: Self-pay | Admitting: Family Medicine

## 2021-12-29 DIAGNOSIS — M79642 Pain in left hand: Secondary | ICD-10-CM

## 2022-01-01 ENCOUNTER — Encounter: Payer: Self-pay | Admitting: Family

## 2022-01-01 ENCOUNTER — Other Ambulatory Visit: Payer: Self-pay | Admitting: Licensed Clinical Social Worker

## 2022-01-01 ENCOUNTER — Ambulatory Visit (INDEPENDENT_AMBULATORY_CARE_PROVIDER_SITE_OTHER): Payer: Medicaid Other | Admitting: Family

## 2022-01-01 ENCOUNTER — Encounter: Payer: Self-pay | Admitting: *Deleted

## 2022-01-01 VITALS — BP 100/80 | HR 87 | Temp 96.7°F | Resp 16 | Ht 66.0 in | Wt 154.0 lb

## 2022-01-01 DIAGNOSIS — F32A Depression, unspecified: Secondary | ICD-10-CM | POA: Diagnosis not present

## 2022-01-01 DIAGNOSIS — I5022 Chronic systolic (congestive) heart failure: Secondary | ICD-10-CM | POA: Diagnosis not present

## 2022-01-01 DIAGNOSIS — Z7689 Persons encountering health services in other specified circumstances: Secondary | ICD-10-CM

## 2022-01-01 DIAGNOSIS — E785 Hyperlipidemia, unspecified: Secondary | ICD-10-CM

## 2022-01-01 DIAGNOSIS — F419 Anxiety disorder, unspecified: Secondary | ICD-10-CM

## 2022-01-01 DIAGNOSIS — I120 Hypertensive chronic kidney disease with stage 5 chronic kidney disease or end stage renal disease: Secondary | ICD-10-CM

## 2022-01-01 DIAGNOSIS — E1165 Type 2 diabetes mellitus with hyperglycemia: Secondary | ICD-10-CM | POA: Diagnosis not present

## 2022-01-01 DIAGNOSIS — N186 End stage renal disease: Secondary | ICD-10-CM

## 2022-01-01 MED ORDER — LEVEMIR FLEXTOUCH 100 UNIT/ML ~~LOC~~ SOPN: PEN_INJECTOR | SUBCUTANEOUS | 0 refills | Status: DC

## 2022-01-01 MED ORDER — ACCU-CHEK GUIDE VI STRP: ORAL_STRIP | 11 refills | Status: DC

## 2022-01-01 MED ORDER — ACCU-CHEK SOFTCLIX LANCETS MISC: 200.0000 | Freq: Two times a day (BID) | 11 refills | Status: DC

## 2022-01-01 MED ORDER — INSULIN GLARGINE 100 UNIT/ML SOLOSTAR PEN: 35.0000 [IU] | PEN_INJECTOR | Freq: Every day | SUBCUTANEOUS | 1 refills | Status: DC

## 2022-01-01 MED ORDER — BLOOD GLUCOSE METER KIT: PACK | 0 refills | Status: DC

## 2022-01-01 NOTE — Patient Instructions (Signed)
Visit Information  Latoya Walsh was given information about Medicaid Managed Care team care coordination services as a part of their [place] Medicaid benefit. Latoya Walsh verbally consented to engagement with the [facility] Managed Care team.   If you are experiencing a medical emergency, please call 911 or report to your local emergency department or urgent care.   If you have a non-emergency medical problem during routine business hours, please contact your provider's office and ask to speak with a nurse.   For questions related to your [facility], please call: [phone] or visit the homepage here: [email or web address]  If you would like to schedule transportation through your [facility], please call the following number at least 2 days in advance of your appointment: [phone]   Rides for urgent appointments can also be made after hours by calling Member Services.  Call the [place] at [phone], at any time, 24 hours a day, 7 days a week. If you are in danger or need immediate medical attention call 911.  If you would like help to quit smoking, call 1-800-QUIT-NOW 248-318-9591) OR Espaol: 1-855-Djelo-Ya (4-010-272-5366) o para ms informacin haga clic aqu or Text READY to 200-400 to register via text   Following is a copy of your plan of care:  Care Plan : General Social Work (Adult)  Updates made by Greg Cutter, LCSW since 01/01/2022 12:00 AM     Problem: I am experiencing grief symptoms   Priority: High  Onset Date: 05/15/2021  Note:   CARE PLAN ENTRY (see longitudinal plan of care for additional care plan information)    Priority: High   Timeframe:  Long-Range Goal Priority:  High Start Date:   12/02/21     Expected End Date:  ongoing         Follow Up Date--None. Goal has been closed as multiple  referrals have been made and grief management coping skill and resource education has been provided. UPDATE case re-opened.  --Follow up date is 01/08/22 at 2 30 pm   Current Barriers:  Knowledge deficits related to accessing mental health provider in patient with Depression  Patient is experiencing symptoms of  depression which seem to be exacerbated by the loss of her sister.     Patient needs Support, Education, and Care Coordination in order to meet unmet mental health needs  Mental Health Concerns    Clinical Social Work Goal(s):  Over the next 90 days, patient will work with SW and Engineer, drilling by telephone or in person to reduce or manage symptoms of depression and grief Patient will implement clinical interventions discussed today to decreases symptoms of depression and increase knowledge and/or ability of: coping skills.    Patient Self Care Activities & Deficits:  Patient is unable to independently navigate community resource options without care coordination support Patient is able to implement clinical interventions discussed today and is motivated for treatment  Patient will select one of the agencies from the list provided and call to schedule an appointment Performs ADL's independently Performs IADL's independently Strong family or social support  Patient Self Care Activities & Deficits:  Patient is unable to independently navigate community resource options without care coordination support Patient is able to implement clinical interventions discussed today and is motivated for treatment  Patient will select one of the agencies from the list provided and call to schedule an appointment Performs ADL's independently Performs IADL's independently  Strong family or social support  10 LITTLE Things To Do When You're Feeling Too Down To Do Anything  Take a shower. Even if you plan to stay in all day long and not see a soul, take a shower. It takes the most  effort to hop in to the shower but once you do, you'll feel immediate results. It will wake you up and you'll be feeling much fresher (and cleaner too).  Brush and floss your teeth. Give your teeth a good brushing with a floss finish. It's a small task but it feels so good and you can check 'taking care of your health' off the list of things to do.  Do something small on your list. Most of [place] have some small thing we would like to get done (load of laundry, sew a button, email a friend). Doing one of these things will make you feel like you've accomplished something.  Drink water. Drinking water is easy right? It's also really beneficial for your health so keep a glass beside you all day and take sips often. It gives you energy and prevents you from boredom eating.  Do some floor exercises. The last thing you want to do is exercise but it might be just the thing you need the most. Keep it simple and do exercises that involve sitting or laying on the floor. Even the smallest of exercises release chemicals in the brain that make you feel good. Yoga stretches or core exercises are going to make you feel good with minimal effort.  Make your bed. Making your bed takes a few minutes but it's productive and you'll feel relieved when it's done. An unmade bed is a huge visual reminder that you're having an unproductive day. Do it and consider it your housework for the day.  Put on some nice clothes. Take the sweatpants off even if you don't plan to go anywhere. Put on clothes that make you feel good. Take a look in the mirror so your brain recognizes the sweatpants have been replaced with clothes that make you look great. It's an instant confidence booster.  Wash the dishes. A pile of dirty dishes in the sink is a reflection of your mood. It's possible that if you wash up the dishes, your mood will follow suit. It's worth a try.  Cook a real meal. If you have the luxury to have a "do nothing" day, you  have time to make a real meal for yourself. Make a meal that you love to eat. The process is good to get you out of the funk and the food will ensure you have more energy for tomorrow.  Write out your thoughts by hand. When you hand write, you stimulate your brain to focus on the moment that you're in so make yourself comfortable and write whatever comes into your mind. Put those thoughts out on paper so they stop spinning around in your head. Those thoughts might be the very thing holding you down.  Please see past updates related to this goal by clicking on the "Past Updates" button in the selected goal

## 2022-01-01 NOTE — Telephone Encounter (Deleted)
Deatra Canter from [employer] called asking if patient was on Levemir and Lantus. Yes, they are both on her medication list.She also wanted [place] to know they could not get her Accu Check Meter approved til Aug. of 2024. Deatra Canter said she would call the patient and let her know.

## 2022-01-01 NOTE — Telephone Encounter (Signed)
Created in error

## 2022-01-01 NOTE — Progress Notes (Signed)
Provider: Webb Silversmith Cezar Misiaszek FNP-C   Pcp, No  Patient Care Team: Pcp, No as PCP - General Branch, [name], MD as PCP - Cardiology (Cardiology) Lianny Montane, RN as Case Manager  Extended Emergency Contact Information Primary Emergency Contact: Fountain,Candy Mobile Phone: [phone] Relation: Sister Secondary Emergency Contact: Cecille Aver, [place] [address] [place] Phone: [phone] Relation: Aunt  Code Status:  Full Code  Goals of care: Advanced Directive information    01/01/2022   10:15 AM  Advanced Directives  Does Patient Have a Medical Advance Directive? No  Would patient like information on creating a medical advance directive? No - Patient declined     Chief Complaint  Patient presents with   Establish Care    New Patient.     HPI:  Pt is a 61 y.o. female seen today for establish scale for medical management of chronic issues.  She is here with her sister who assist with filling admission paperwork. Her sister states just started learning about her medical history and taking over her care. she denies any acute issues this visit.  Has medical history as follows.  Hypertension - No home log for review.On Metroprolol, Imdur and amlodipine.denies any headache,dizziness,vision changes,fatigue,chest tightness,palpitation,chest pain or shortness of breath.      Type 2 DM -  No home CBG for review.on levemir   ESRD - has been on hemodialysis since 2023.Has central like for dialysis   No Noted  weight loss.states appetite is good. Due for Sinigrin and COVID-19.  Vaccine.  Also due for screening colonoscopy mammogram and annual eye exam.  Since that states will book an appointment for the eye exam.   Past Medical History:  Diagnosis Date   Allergic rhinitis    Anxiety    CHF (congestive heart failure) ([place])    a. EF 30-35% by echo in 05/2020 b. EF at 45% in 09/2020   Chronic back pain    Chronic hepatitis C without  hepatic coma ([place])    COVID-19 virus infection 12/26/2019   Depression    Diabetes mellitus    Hepatitis C    Hypertension    Noncompliance    Poor appetite 07/17/2014   Substance abuse ([place])    HX of drug use and alcohol use   Past Surgical History:  Procedure Laterality Date   APPENDECTOMY  2004   AV FISTULA PLACEMENT Left 11/26/2020   Procedure: LEFT ARM ARTERIOVENOUS (AV) FISTULA CREATION;  Surgeon: [name], MD;  Location: [facility];  Service: Vascular;  Laterality: Left;   AV FISTULA PLACEMENT Left 06/03/2021   Procedure: INSERTION OF LEFT UPPER ARM ARTERIOVENOUS (AV) GORE-TEX GRAFT;  Surgeon: [name], MD;  Location: [facility];  Service: Vascular;  Laterality: Left;   IR FLUORO GUIDE CV LINE RIGHT  12/25/2020   IR US GUIDE VASC ACCESS RIGHT  12/25/2020   LIGATION ARTERIOVENOUS GORTEX GRAFT Left 08/05/2021   Procedure: LIGATION OF LEFT ARM ARTERIOVENOUS GORTEX GRAFT;  Surgeon: [name], MD;  Location: [facility];  Service: Vascular;  Laterality: Left;    Allergies  Allergen Reactions   Penicillins Itching and Rash    Allergies as of 01/01/2022       Reactions   Penicillins Itching, Rash        Medication List        Accurate as of January 01, 2022 10:24 AM. If you have any questions, ask your nurse  or doctor.          STOP taking these medications    betamethasone dipropionate 0.05 % cream Stopped by: [name] Teylor Wolven, NP   calcitRIOL 0.25 MCG capsule Commonly known as: ROCALTROL Stopped by: Sandrea Hughs, NP   furosemide 80 MG tablet Commonly known as: LASIX Stopped by: Sandrea Hughs, NP   gabapentin 100 MG capsule Commonly known as: NEURONTIN Stopped by: [name] Coryn Mosso, NP   glipiZIDE 5 MG 24 hr tablet Commonly known as: GLUCOTROL XL Stopped by: Sandrea Hughs, NP   guaiFENesin-dextromethorphan 100-10 MG/5ML syrup Commonly known as: ROBITUSSIN DM Stopped by: Sandrea Hughs, NP   hydrALAZINE 50 MG tablet Commonly known as:  APRESOLINE Stopped by: Sandrea Hughs, NP   hydrOXYzine 50 MG capsule Commonly known as: VISTARIL Stopped by: Sandrea Hughs, NP   mirtazapine 15 MG tablet Commonly known as: REMERON Stopped by: Sandrea Hughs, NP   oxyCODONE-acetaminophen 5-325 MG tablet Commonly known as: Percocet Stopped by: Sandrea Hughs, NP   sodium bicarbonate 650 MG tablet Stopped by: Sandrea Hughs, NP       TAKE these medications    Accu-Chek Guide test strip Generic drug: glucose blood Use to check glucose twice daily   Accu-Chek Softclix Lancets lancets 2 (two) times daily.   albuterol 108 (90 Base) MCG/ACT inhaler Commonly known as: VENTOLIN HFA Inhale 2 puffs into the lungs every 4 (four) hours as needed for wheezing or shortness of breath.   amLODipine 10 MG tablet Commonly known as: NORVASC Take 10 mg by mouth daily.   blood glucose meter kit and supplies Dispense based on patient and insurance preference. Use to monitor glucose twice daily.   insulin glargine 100 UNIT/ML Solostar Pen Commonly known as: LANTUS Inject 35 Units into the skin at bedtime.   isosorbide mononitrate 30 MG 24 hr tablet Commonly known as: IMDUR TAKE ONE TABLET BY MOUTH EVERY MORNING   Levemir FlexTouch 100 UNIT/ML FlexPen Generic drug: insulin detemir INJECT 35 UNITS into THE SKIN AT BEDTIME   metoprolol succinate 50 MG 24 hr tablet Commonly known as: TOPROL-XL TAKE ONE TABLET BY MOUTH EVERY EVENING   Pen Needles 31G X 5 MM Misc Use to inject insulin once daily   Comfort EZ Pen Needles 32G X 4 MM Misc Generic drug: Insulin Pen Needle USE AS DIRECTED with insulin injections   rosuvastatin 10 MG tablet Commonly known as: CRESTOR TAKE ONE TABLET BY MOUTH EVERY MORNING   sevelamer carbonate 800 MG tablet Commonly known as: RENVELA Take 800 mg by mouth 3 (three) times daily with meals.   Vitamin D3 25 MCG (1000 UT) Caps Take 1 capsule by mouth daily.        Review of Systems   Constitutional:  Negative for appetite change, chills, fatigue, fever and unexpected weight change.  HENT:  Negative for congestion, dental problem, ear discharge, ear pain, facial swelling, hearing loss, nosebleeds, postnasal drip, rhinorrhea, sinus pressure, sinus pain, sneezing, sore throat, tinnitus and trouble swallowing.   Eyes:  Negative for pain, discharge, redness, itching and visual disturbance.  Respiratory:  Negative for cough, chest tightness, shortness of breath and wheezing.   Cardiovascular:  Negative for chest pain, palpitations and leg swelling.  Gastrointestinal:  Negative for abdominal distention, abdominal pain, blood in stool, constipation, diarrhea, nausea and vomiting.  Endocrine: Negative for cold intolerance, heat intolerance, polydipsia, polyphagia and polyuria.  Genitourinary:  Negative for difficulty urinating, dysuria, flank pain, frequency and  urgency.  Musculoskeletal:  Negative for arthralgias, back pain, gait problem, joint swelling, myalgias, neck pain and neck stiffness.  Skin:  Negative for color change, pallor, rash and wound.  Neurological:  Negative for dizziness, syncope, speech difficulty, weakness, light-headedness, numbness and headaches.  Hematological:  Does not bruise/bleed easily.  Psychiatric/Behavioral:  Negative for agitation, behavioral problems, confusion, hallucinations, self-injury, sleep disturbance and suicidal ideas. The patient is not nervous/anxious.     Immunization History  Administered Date(s) Administered   H1N1 04/04/2008   Hepb-cpg 02/07/2021, 03/14/2021, 04/09/2021, 06/04/2021   Influenza Split 02/05/2011, 12/02/2011   Influenza Whole 12/10/2006, 01/08/2009   Influenza, Quadrivalent, Recombinant, Inj, Pf 12/26/2021   Influenza,inj,Quad PF,6+ Mos 02/12/2014, 03/27/2015, 03/05/2020, 01/22/2021   PFIZER(Purple Top)SARS-COV-2 Vaccination 09/29/2019   Pneumococcal Conjugate-13 06/11/2014   Pneumococcal Polysaccharide-23  12/05/2004, 10/30/2010   Td 08/09/2003   Pertinent  Health Maintenance Due  Topic Date Due   COLONOSCOPY (Pts 45-57yr Insurance coverage will need to be confirmed)  Never done   OPHTHALMOLOGY EXAM  09/17/2021   MAMMOGRAM  11/23/2021   HEMOGLOBIN A1C  02/14/2022   FOOT EXAM  05/12/2022   PAP SMEAR-Modifier  10/03/2022   INFLUENZA VACCINE  Completed      11/04/2021   12:00 AM 11/04/2021    8:50 AM 11/04/2021   11:00 PM 11/05/2021    7:13 AM 01/01/2022   10:15 AM  Fall Risk  Falls in the past year?     0  Was there an injury with Fall?     0  Fall Risk Category Calculator     0  Fall Risk Category     Low  Patient Fall Risk Level Moderate fall risk Moderate fall risk Moderate fall risk Moderate fall risk Low fall risk  Patient at Risk for Falls Due to     No Fall Risks  Fall risk Follow up     Falls evaluation completed   Functional Status Survey:    Vitals:   01/01/22 1003  BP: 100/80  Pulse: 87  Resp: 16  Temp: (!) 96.7 F (35.9 C)  SpO2: 98%  Weight: 154 lb (69.9 kg)  Height: 5' 6"  (1.676 m)   Body mass index is 24.86 kg/m. Physical Exam Vitals reviewed.  Constitutional:      General: She is not in acute distress.    Appearance: Normal appearance. She is normal weight. She is not ill-appearing or diaphoretic.  HENT:     Head: Normocephalic.     Right Ear: Tympanic membrane, ear canal and external ear normal. There is no impacted cerumen.     Left Ear: Tympanic membrane, ear canal and external ear normal. There is no impacted cerumen.     Nose: Nose normal. No congestion or rhinorrhea.     Mouth/Throat:     Mouth: Mucous membranes are moist.     Pharynx: Oropharynx is clear. No oropharyngeal exudate or posterior oropharyngeal erythema.  Eyes:     General: No scleral icterus.       Right eye: No discharge.        Left eye: No discharge.     Extraocular Movements: Extraocular movements intact.     Conjunctiva/sclera: Conjunctivae normal.     Pupils: Pupils  are equal, round, and reactive to light.  Neck:     Vascular: No carotid bruit.  Cardiovascular:     Rate and Rhythm: Normal rate and regular rhythm.     Pulses: Normal pulses.     Heart sounds: Normal  heart sounds. No murmur heard.    No friction rub. No gallop.  Pulmonary:     Effort: Pulmonary effort is normal. No respiratory distress.     Breath sounds: Normal breath sounds. No wheezing, rhonchi or rales.  Chest:     Chest wall: No tenderness.  Abdominal:     General: Bowel sounds are normal. There is no distension.     Palpations: Abdomen is soft. There is no mass.     Tenderness: There is no abdominal tenderness. There is no right CVA tenderness, left CVA tenderness, guarding or rebound.  Musculoskeletal:        General: No swelling or tenderness. Normal range of motion.     Cervical back: Normal range of motion. No rigidity or tenderness.     Right lower leg: No edema.     Left lower leg: No edema.  Lymphadenopathy:     Cervical: No cervical adenopathy.  Skin:    General: Skin is warm and dry.     Coloration: Skin is not pale.     Findings: No bruising, erythema, lesion or rash.  Neurological:     Mental Status: She is alert and oriented to person, place, and time.     Cranial Nerves: No cranial nerve deficit.     Sensory: No sensory deficit.     Motor: No weakness.     Coordination: Coordination normal.     Gait: Gait normal.  Psychiatric:        Mood and Affect: Mood normal.        Speech: Speech normal.        Behavior: Behavior normal.        Thought Content: Thought content normal.        Judgment: Judgment normal.     Labs reviewed: Recent Labs    11/03/21 0830 11/04/21 0500 11/05/21 0614  NA 135 139 134*  K 4.2 4.2 3.8  CL 101 107 99  CO2 23 22 25   GLUCOSE 372* 96 160*  BUN 37* 45* 37*  CREATININE 6.54* 7.37* 5.20*  CALCIUM 9.0 8.6* 8.2*  MG  --  2.1 2.0   Recent Labs    11/03/21 0830 11/04/21 0500 11/05/21 0614  AST 14* 12* 289*  ALT 14  11 31   ALKPHOS 83 66 134*  BILITOT 0.6 0.3 3.0*  PROT 7.3 6.1* 5.9*  ALBUMIN 3.3* 2.6* 2.5*   Recent Labs    11/03/21 0830 11/04/21 0500 11/05/21 0614  WBC 9.6 7.1 8.6  NEUTROABS 7.9* 4.9 5.6  HGB 8.7* 7.9* 8.2*  HCT 31.7* 29.2* 27.9*  MCV 73.4* 74.1* 72.1*  PLT 215 183 137*   Lab Results  Component Value Date   TSH 1.673 06/07/2020   Lab Results  Component Value Date   HGBA1C >15 08/14/2021   Lab Results  Component Value Date   CHOL 167 05/02/2020   HDL 34 (L) 05/02/2020   LDLCALC 87 05/02/2020   TRIG 277 (H) 05/02/2020   CHOLHDL 4.9 (H) 05/02/2020    Significant Diagnostic Results in last 30 days:  No results found.  Assessment/Plan  1. Uncontrolled type 2 diabetes mellitus with hyperglycemia [facility]) Lab Results  Component Value Date   HGBA1C >15 08/14/2021   - blood glucose meter kit and supplies; Dispense based on patient and insurance preference. Use to monitor glucose twice daily.  Dispense: 1 each; Refill: 0 - Accu-Chek Softclix Lancets lancets; 200 each by Other route 2 (two) times daily.  Dispense: 100 each;  Refill: 11 - ACCU-CHEK GUIDE test strip; Use to check glucose twice daily  Dispense: 100 each; Refill: 11 - insulin glargine (LANTUS) 100 UNIT/ML Solostar Pen; Inject 35 Units into the skin at bedtime.  Dispense: 15 mL; Refill: 1 - insulin detemir (LEVEMIR FLEXTOUCH) 100 UNIT/ML FlexPen; INJECT 35 UNITS into THE SKIN AT BEDTIME  Dispense: 15 mL; Refill: 0  2. Encounter to establish care Available medical history reviewed, will await final medical records and update immunization.  Recommended scheduling for fasting blood work.  3. Benign hypertension with ESRD (end-stage renal disease) ([place]) Blood pressure well controlled Continue on Imdur, metoprolol and amlodipine On dialysis 3 times a week Monday Wednesday and Friday  4. Hyperlipidemia LDL goal <100 Previous LDL at goal 1 year ago Continue with dietary modification and exercise Continue on  rosuvastatin  5. Chronic systolic congestive heart failure ([facility]) No signs of fluid overload fluid removed during dialysis Not on diuretic  6. ESRD (end stage renal disease) ([place]) Continue to follow-up with nephrologist On dialysis Monday Wednesday Friday  7. Anxiety and depression Mood stable Currently off Remeron  Continue to monitor mood stability   Family/ staff Communication: Reviewed plan of care with patient and daughter verbalized understanding  Labs/tests ordered:  - CBC with Differential/Platelet - CMP with eGFR([facility]) - TSH - Hgb A1C - Lipid panel    Next Appointment : Return in about 4 months (around 05/04/2022) for medical mangement of chronic issues., fasting labs in one week.   Sandrea Hughs, NP

## 2022-01-01 NOTE — Patient Outreach (Signed)
Medicaid Managed Care Social Work Note  01/01/2022 Name:  Latoya Walsh MRN:  [id number] DOB:  1961/01/16  Latoya Walsh is an 61 y.o. year old female who is a primary patient of Pcp, No.  The Medicaid Managed Care Coordination team was consulted for assistance with:  Latoya and Resources  Ms. Walsh was given information about Medicaid Managed Care Coordination team services today. Latoya Walsh Patient agreed to services and verbal consent obtained.  Engaged with patient  for by telephone forfollow up visit in response to referral for case management and/or care coordination services.   Assessments/Interventions:  Review of past medical history, allergies, medications, health status, including review of consultants reports, laboratory and other test data, was performed as part of comprehensive evaluation and provision of chronic care management services.  SDOH: (Social Determinant of Health) assessments and interventions performed: SDOH Interventions    Flowsheet Row Patient Outreach Telephone from 08/21/2021 in [place] Patient Outreach Telephone from 07/31/2021 in [place] Patient Outreach Telephone from 07/10/2021 in [place] Patient Outreach Telephone from 06/26/2021 in [name] Patient Outreach Telephone from 02/28/2021 in [name] Patient Outreach Telephone from 02/06/2021 in [name] Coordination  SDOH Interventions        Food Insecurity Interventions -- Other (Comment)  [Advised patient to recertify for food benefits] -- -- -- Intervention Not Indicated  [Patient has ample amount of food at this time.]  Housing Interventions -- -- Intervention Not Indicated  [Patient is currently living with her daughter] -- -- Other  (Comment)  Engineer, maintenance Guide referral for assistance with heat]  Transportation Interventions -- Other (Comment)  [Provided patient with UHC transportation [phone] -- -- -- --  Depression Interventions/Treatment  -- -- -- Medication --  [Referral to MM LCSW, Quinnlan Abruzzo] --  Stress Interventions Provide Counseling, [employer] -- -- -- -- --       Advanced Directives Status:  See Care Plan for related entries.  Care Plan                 Allergies  Allergen Reactions   Penicillins Itching and Rash    Medications Reviewed Today     Reviewed by Otis Peak, CMA (Certified Medical Assistant) on 01/01/22 at 1014  Med List Status: <None>   Medication Order Taking? Sig Documenting Provider Last Dose Status Informant  ACCU-CHEK GUIDE test strip [id number] Yes Use to check glucose twice daily [name], NP Taking Active Self, Pharmacy Records  Accu-Chek Softclix Lancets lancets [id number] Yes 2 (two) times daily. [provider] Taking Active Self, Pharmacy Records  albuterol (VENTOLIN HFA) 108 (90 Base) MCG/ACT inhaler [id number] Yes Inhale 2 puffs into the lungs every 4 (four) hours as needed for wheezing or shortness of breath. Manuella Ghazi, Pratik D, DO Taking Active   amLODipine (NORVASC) 10 MG tablet [id number] Yes Take 10 mg by mouth daily. [provider] Taking Active Self, Pharmacy Records           Med Note Latoya Walsh, [name]   Thu Jan 01, 2022 10:10 AM)    blood glucose meter kit and supplies [id number] Yes Dispense based on patient and insurance preference. Use to monitor glucose twice daily. [name], NP Taking Active Self, Pharmacy Records  Cholecalciferol (VITAMIN D3) 25 MCG (1000 UT) CAPS [id number] Yes Take 1 capsule by  mouth daily. [provider] Taking Active Self, Pharmacy Records           Med Note Latoya Walsh, Latoya Walsh   Thu Jan 01, 2022 10:11 AM)    COMFORT EZ PEN NEEDLES 32G X 4 MM MISC [id number]  USE AS  DIRECTED with insulin injections [name], NP  Active   insulin glargine (LANTUS) 100 UNIT/ML Solostar Pen [id number] No Inject 35 Units into the skin at bedtime. [name], NP Unknown Active Self, Pharmacy Records           Med Note Latoya Walsh, [name]   Thu Jan 01, 2022 10:14 AM)    Insulin Pen Needle (PEN NEEDLES) 31G X 5 MM MISC [id number]  Use to inject insulin once daily [name], NP  Active Self, Pharmacy Records  isosorbide mononitrate (IMDUR) 30 MG 24 hr tablet [id number] Yes TAKE ONE TABLET BY MOUTH EVERY MORNING Latoya Lenis, MD Taking Active   LEVEMIR FLEXTOUCH 100 UNIT/ML FlexTouch Pen [id number] No INJECT 35 UNITS into THE SKIN AT BEDTIME Latoya Anger, MD Unknown Active Self, Pharmacy Records           Med Note Latoya Walsh, [name]   Thu Jan 01, 2022 10:14 AM)    metoprolol succinate (TOPROL-XL) 50 MG 24 hr tablet [id number] Yes TAKE ONE TABLET BY MOUTH EVERY [name], MD Taking Active   rosuvastatin (CRESTOR) 10 MG tablet [id number] Yes TAKE ONE TABLET BY MOUTH EVERY MORNING Branch, [name], MD Taking Active   sevelamer carbonate (RENVELA) 800 MG tablet [id number] Yes Take 800 mg by mouth 3 (three) times daily with meals. [provider] Taking Active Self, Pharmacy Records           Med Note Latoya Walsh, [name] Jan 01, 2022 10:12 AM)    Med List Note Latoya Walsh, [place] 32/91/91 6606):              Patient Active Problem List   Diagnosis Date Noted   ESRD (end stage renal disease) ([place]) 01/06/2021   CHF (congestive heart failure) ([place]) 06/06/2020   Leg swelling 04/28/2020   Hyperlipidemia LDL goal <100 04/28/2020   Left hand pain 03/10/2020   Anemia 02/19/2020   COVID-19 virus infection 12/26/2019   Depression, major, single episode, in partial remission ([place]) 01/25/2019   Headache 11/13/2018   Insomnia 09/25/2018   Screening for colorectal cancer 07/23/2016   Lumbar back pain with  radiculopathy affecting right lower extremity 03/27/2015   Non compliance with medical treatment 07/22/2014   Chronic hepatitis C without hepatic coma ([place]) 12/04/2013   Allergic rhinitis 11/03/2012   Anxiety and depression 02/05/2011   Nicotine dependence 11/01/2010   Dermatitis 10/30/2010   Drug dependence, continuous abuse ([place]) 03/22/2010   Uncontrolled type 2 diabetes mellitus with hyperglycemia ([place]) 06/02/2007   Alcohol abuse 06/02/2007    Conditions to be addressed/monitored per PCP order:   Grief  Care Plan : General Social Work (Adult)  Updates made by Greg Cutter, LCSW since 01/01/2022 12:00 AM     Problem: I am experiencing grief symptoms   Priority: High  Onset Date: 05/15/2021  Note:   CARE PLAN ENTRY (see longitudinal plan of care for additional care plan information)    Priority: High   Timeframe:  Long-Range Goal Priority:  High Start Date:   12/02/21     Expected End Date:  ongoing         Follow  Up Date--None. Goal has been closed as multiple referrals have been made and grief management coping skill and resource education has been provided. UPDATE case re-opened.  --Follow up date is 01/08/22 at 2 30 pm   Current Barriers:  Knowledge deficits related to accessing mental health provider in patient with Depression  Patient is experiencing symptoms of  depression which seem to be exacerbated by the loss of her sister.     Patient needs Support, Education, and Care Coordination in order to meet unmet mental health needs  Mental Health Concerns    Clinical Social Work Goal(s):  Over the next 90 days, patient will work with SW and Engineer, drilling by telephone or in person to reduce or manage symptoms of depression and grief Patient will implement clinical interventions discussed today to decreases symptoms of depression and increase knowledge and/or ability of: coping skills.   Interventions:  Assessed patient's understanding, education,  previous treatment and care coordination needs  Patient continues to discuss difficulty in dealing with the recent loss of her sister evidenced by depressed mood, insomnia and crying spells Patient confirms having family support, lives with her daughter at this time but is working with her Education officer, museum to find her own place. Dialysis continues on Monday, Wednesdays and Fridays-vans provides transport Patient reports that she goes to bible study weekly and watches church sermons on Sundays. She reports that her church provides stable transportation to and from bible study during the week. Patient reports that she uses Medicaid transportation Provided basic mental health support, education and interventions  Grief response normalized, emotional support provided, grief counseling encouraged Collaborated with appropriate clinical care team members regarding patient's needs. [facility] LCSW mae referral to [facility] RNCM for chronic pain management.  Discussed  options for long term counseling based on need and insurance. Patient agreeable to ongoing mental health counseling Patient was referred to grief therapy through Hosp[facility] on 06/26/21 Reviewed mental health medications with patient prescribed by PCP and discussed compliance  Other interventions include: Motivational Interviewing employed Active listening / Reflection utilized  Emotional Support Provided  UPDATE 08/01/21- [facility] RNCM informed [facility] LCSW that patient had not been contacted by [facility]osp[facility] yet. [facility] LCSW contacted [facility] to inquire and left a message. [facility] LCSW received a return call from [facility] counselor and was informed that they have talked to patient twice. Patient said she had a headache and had to get off the phone the first call and the second call patient reported that she did not want bereavement counseling anymore because she was doing well. [facility] LCSW completed call to patient and provided this information. [place] LCSW  updated [facility] RNCM with final update.  Patient is agreeable to grief counseling and admits she has been experiencing some confusion recently. Patient was educated on their available grief support groups as well. [facility] LCSW will follow up next month. UPDATE 08/21/21- Patient reports that she did not complete grief therapy with [facility]. She reports that they may have left her a voice message but she has not been checking her messages. [facility] LCSW informed her that they were going to make one last outreach to her in order to schedule her initial appointment but if they were unable to reach her at this last outreach attempt then they would not be able to accept an additional referral and would prefer for patient to contact them directly if she wishes to gain services. [facility] LCSW provided patient with their contact number which is [phone]. Patient  reports that her hand hurts and she needs to get off the phone. She reports that she is not able to talk on the phone long because of the nerve pain in her hand. [facility] LCSW informed patient that [facility] LCSW will be closing her case for social work support now that grief coping skill and resource education has been provided and two referrals made for bereavement counseling. Patient expressed understanding and is agreeable. [place] LCSW will update [place] who is still involved in patient's care. UPDATE- 12/02/21- Patient is interested in gaining telephonic therapy. Case re-opened on 12/02/21 and referral made to [facility] by [facility] LCSW today on 12/02/21 as well. [facility] RNCM and BSW updated. UPDATE- [facility] LCSW completed call to patient and completed joint phone call with her to [facility] and was informed that she is currently on the wait list for services and should be getting a call within one week. [facility] LCSW asked if Candy's contact information could be placed on referral as patient gets easily confused and needs care coordination. [facility] added this contact information with patient's approval.  [facility] LCSW will follow up within the month to ensure that patient was able to complete intake paperwork which may require her going to a local office of theirs to do this as patient does not use the computer. [facility] LCSW will monitor this referral closely. [facility] received call from [facility] stating that they are unable to accept patient for counseling because of her substance abuse diagnoses. [facility] LCSW explained that these substance use disorders are in remission but [facility] report not being able to accept patient. [facility] LCSW contacted family and updated them. Patient's sister took patient to an appointment today that went well. Sister is agreeable to being the contact person for future referrals. Family is agreeable to [facility] LCSW placing an additional referral [facility]. Referral successfully placed on 01/01/22.    Patient Self Care Activities & Deficits:  Patient is unable to independently navigate community resource options without care coordination support Patient is able to implement clinical interventions discussed today and is motivated for treatment  Patient will select one of the agencies from the list provided and call to schedule an appointment Performs ADL's independently Performs IADL's independently Strong family or social support  Patient Self Care Activities & Deficits:  Patient is unable to independently navigate community resource options without care coordination support Patient is able to implement clinical interventions discussed today and is motivated for treatment  Patient will select one of the agencies from the list provided and call to schedule an appointment Performs ADL's independently Performs IADL's independently Strong family or social support  10 LITTLE Things To Do When You're Feeling Too Down To Do Anything  Take a shower. Even if you plan to stay in all day long and not see a soul, take a shower. It  takes the most effort to hop in to the shower but once you do, you'll feel immediate results. It will wake you up and you'll be feeling much fresher (and cleaner too).  Brush and floss your teeth. Give your teeth a good brushing with a floss finish. It's a small task but it feels so good and you can check 'taking care of your health' off the list of things to do.  Do something small on your list. Most of [place] have some small thing we would like to get done (load of laundry, sew a button, email  a friend). Doing one of these things will make you feel like you've accomplished something.  Drink water. Drinking water is easy right? It's also really beneficial for your health so keep a glass beside you all day and take sips often. It gives you energy and prevents you from boredom eating.  Do some floor exercises. The last thing you want to do is exercise but it might be just the thing you need the most. Keep it simple and do exercises that involve sitting or laying on the floor. Even the smallest of exercises release chemicals in the brain that make you feel good. Yoga stretches or core exercises are going to make you feel good with minimal effort.  Make your bed. Making your bed takes a few minutes but it's productive and you'll feel relieved when it's done. An unmade bed is a huge visual reminder that you're having an unproductive day. Do it and consider it your housework for the day.  Put on some nice clothes. Take the sweatpants off even if you don't plan to go anywhere. Put on clothes that make you feel good. Take a look in the mirror so your brain recognizes the sweatpants have been replaced with clothes that make you look great. It's an instant confidence booster.  Wash the dishes. A pile of dirty dishes in the sink is a reflection of your mood. It's possible that if you wash up the dishes, your mood will follow suit. It's worth a try.  Cook a real meal. If you have the luxury to have a "do  nothing" day, you have time to make a real meal for yourself. Make a meal that you love to eat. The process is good to get you out of the funk and the food will ensure you have more energy for tomorrow.  Write out your thoughts by hand. When you hand write, you stimulate your brain to focus on the moment that you're in so make yourself comfortable and write whatever comes into your mind. Put those thoughts out on paper so they stop spinning around in your head. Those thoughts might be the very thing holding you down.  Please see past updates related to this goal by clicking on the "Past Updates" button in the selected goal       Follow up:  Patient agrees to Care Plan and Follow-up.  Plan: The Managed Medicaid care management team will reach out to the patient again over the next 30 days.  Date of next scheduled Social Work care management/care coordination outreach:  01/08/22  Eula Fried, BSW, MSW, LCSW Managed Medicaid LCSW [email or web address] Phone: [phone]

## 2022-01-03 DIAGNOSIS — N179 Acute kidney failure, unspecified: Secondary | ICD-10-CM | POA: Diagnosis not present

## 2022-01-04 DIAGNOSIS — N179 Acute kidney failure, unspecified: Secondary | ICD-10-CM | POA: Diagnosis not present

## 2022-01-06 ENCOUNTER — Other Ambulatory Visit: Payer: Medicaid Other

## 2022-01-08 ENCOUNTER — Other Ambulatory Visit: Payer: Medicaid Other

## 2022-01-08 ENCOUNTER — Other Ambulatory Visit: Payer: Self-pay | Admitting: *Deleted

## 2022-01-08 ENCOUNTER — Telehealth: Payer: Self-pay | Admitting: Licensed Clinical Social Worker

## 2022-01-08 ENCOUNTER — Ambulatory Visit: Payer: Self-pay

## 2022-01-08 DIAGNOSIS — E785 Hyperlipidemia, unspecified: Secondary | ICD-10-CM

## 2022-01-08 DIAGNOSIS — I120 Hypertensive chronic kidney disease with stage 5 chronic kidney disease or end stage renal disease: Secondary | ICD-10-CM | POA: Diagnosis not present

## 2022-01-08 DIAGNOSIS — E1165 Type 2 diabetes mellitus with hyperglycemia: Secondary | ICD-10-CM | POA: Diagnosis not present

## 2022-01-08 DIAGNOSIS — N186 End stage renal disease: Secondary | ICD-10-CM | POA: Diagnosis not present

## 2022-01-08 DIAGNOSIS — I5022 Chronic systolic (congestive) heart failure: Secondary | ICD-10-CM | POA: Diagnosis not present

## 2022-01-08 NOTE — Patient Outreach (Signed)
  Medicaid Managed Care   Unsuccessful Attempt Note   01/08/2022 Name: Latoya Walsh MRN: [id number] DOB: 04-20-1960  Referred by: Sandrea Hughs, NP Reason for referral : High Risk Managed Medicaid   An unsuccessful telephone outreach was attempted today. The patient was referred to the case management team for assistance with care management and care coordination.    Follow Up Plan: The Managed Medicaid care management team will reach out to the patient again over the next 30 days.   Eula Fried, BSW, MSW, [employer] Managed Medicaid LCSW [email or web address] Phone: [phone]

## 2022-01-08 NOTE — Patient Outreach (Addendum)
  Medicaid Managed Care   Unsuccessful Attempt Note   01/08/2022 Name: Latoya Walsh MRN: [id number] DOB: 23-Dec-1960  Referred by: Sandrea Hughs, NP Reason for referral : High Risk Managed Medicaid (Unsuccessful RNCM follow up telephone outreach)   An unsuccessful telephone outreach was attempted today. The patient was referred to the case management team for assistance with care management and care coordination.    RNCM was able to reach Emergency Contact/Candy. Candy requested that RNCM attempt to contact patient again in 10 minutes. She was going to reach out to [name]. RNCM reattempted to reach [name] at both contact numbers without success.  Follow Up Plan: The Managed Medicaid care management team will reach out to the patient again over the next 14 days.    [name] RN, BSN   [employer] manager

## 2022-01-08 NOTE — Patient Instructions (Signed)
Visit Information  [name]  - as a part of your Medicaid benefit, you are eligible for care management and care coordination services at no cost or copay. I was unable to reach you by phone today but would be happy to help you with your health related needs. Please feel free to call me @ [phone].   A member of the Managed Medicaid care management team will reach out to you again over the next 14 days.   [name] RN, BSN [place]  [employer] manager

## 2022-01-09 LAB — COMPLETE METABOLIC PANEL WITH GFR
AG Ratio: 1.1 (calc) (ref 1.0–2.5)
ALT: 14 U/L (ref 6–29)
AST: 13 U/L (ref 10–35)
Albumin: 4 g/dL (ref 3.6–5.1)
Alkaline phosphatase (APISO): 96 U/L (ref 37–153)
BUN/Creatinine Ratio: 6 (calc) (ref 6–22)
BUN: 34 mg/dL — ABNORMAL HIGH (ref 7–25)
CO2: 22 mmol/L (ref 20–32)
Calcium: 9.4 mg/dL (ref 8.6–10.4)
Chloride: 88 mmol/L — ABNORMAL LOW (ref 98–110)
Creat: 6.06 mg/dL — ABNORMAL HIGH (ref 0.50–1.03)
Globulin: 3.6 g/dL (calc) (ref 1.9–3.7)
Glucose, Bld: 598 mg/dL (ref 65–99)
Potassium: 4.8 mmol/L (ref 3.5–5.3)
Sodium: 127 mmol/L — ABNORMAL LOW (ref 135–146)
Total Bilirubin: 0.5 mg/dL (ref 0.2–1.2)
Total Protein: 7.6 g/dL (ref 6.1–8.1)
eGFR: 8 mL/min/{1.73_m2} — ABNORMAL LOW (ref >=60)

## 2022-01-09 LAB — CBC WITH DIFFERENTIAL/PLATELET
Absolute Monocytes: 664 cells/uL (ref 200–950)
Basophils Absolute: 91 cells/uL (ref 0–200)
Basophils Relative: 1.1 %
Eosinophils Absolute: 291 cells/uL (ref 15–500)
Eosinophils Relative: 3.5 %
HCT: 30.6 % — ABNORMAL LOW (ref 35.0–45.0)
Hemoglobin: 8.2 g/dL — ABNORMAL LOW (ref 11.7–15.5)
Lymphs Abs: 3569 cells/uL (ref 850–3900)
MCH: 19.5 pg — ABNORMAL LOW (ref 27.0–33.0)
MCHC: 26.8 g/dL — ABNORMAL LOW (ref 32.0–36.0)
MCV: 72.9 fL — ABNORMAL LOW (ref 80.0–100.0)
MPV: 11.5 fL (ref 7.5–12.5)
Monocytes Relative: 8 %
Neutro Abs: 3685 cells/uL (ref 1500–7800)
Neutrophils Relative %: 44.4 %
Platelets: 230 10*3/uL (ref 140–400)
RBC: 4.2 10*6/uL (ref 3.80–5.10)
RDW: 17.5 % — ABNORMAL HIGH (ref 11.0–15.0)
Total Lymphocyte: 43 %
WBC: 8.3 10*3/uL (ref 3.8–10.8)

## 2022-01-09 LAB — LIPID PANEL
Cholesterol: 168 mg/dL (ref <200)
HDL: 41 mg/dL — ABNORMAL LOW (ref >=50)
LDL Cholesterol (Calc): 93 mg/dL (calc)
Non-HDL Cholesterol (Calc): 127 mg/dL (calc) (ref <130)
Total CHOL/HDL Ratio: 4.1 (calc) (ref <5.0)
Triglycerides: 258 mg/dL — ABNORMAL HIGH (ref <150)

## 2022-01-09 LAB — HEMOGLOBIN A1C
Hgb A1c MFr Bld: 13.3 % of total Hgb — ABNORMAL HIGH (ref <5.7)
Mean Plasma Glucose: 335 mg/dL
eAG (mmol/L): 18.6 mmol/L

## 2022-01-09 LAB — TSH: TSH: 1.84 mIU/L (ref 0.40–4.50)

## 2022-01-10 DIAGNOSIS — N179 Acute kidney failure, unspecified: Secondary | ICD-10-CM | POA: Diagnosis not present

## 2022-01-11 DIAGNOSIS — N179 Acute kidney failure, unspecified: Secondary | ICD-10-CM | POA: Diagnosis not present

## 2022-01-14 ENCOUNTER — Other Ambulatory Visit: Payer: Self-pay | Admitting: Family Medicine

## 2022-01-14 DIAGNOSIS — M79642 Pain in left hand: Secondary | ICD-10-CM

## 2022-01-15 ENCOUNTER — Encounter: Payer: Self-pay | Admitting: Family

## 2022-01-15 ENCOUNTER — Ambulatory Visit (INDEPENDENT_AMBULATORY_CARE_PROVIDER_SITE_OTHER): Payer: Medicaid Other | Admitting: Family

## 2022-01-15 ENCOUNTER — Other Ambulatory Visit: Payer: Self-pay | Admitting: Family Medicine

## 2022-01-15 VITALS — BP 98/66 | HR 87 | Temp 97.5°F | Resp 18 | Ht 66.0 in | Wt 156.2 lb

## 2022-01-15 DIAGNOSIS — E1165 Type 2 diabetes mellitus with hyperglycemia: Secondary | ICD-10-CM

## 2022-01-15 DIAGNOSIS — M79642 Pain in left hand: Secondary | ICD-10-CM

## 2022-01-15 DIAGNOSIS — R634 Abnormal weight loss: Secondary | ICD-10-CM | POA: Diagnosis not present

## 2022-01-15 DIAGNOSIS — R051 Acute cough: Secondary | ICD-10-CM

## 2022-01-15 DIAGNOSIS — R630 Anorexia: Secondary | ICD-10-CM

## 2022-01-15 LAB — GLUCOSE, POCT (MANUAL RESULT ENTRY): POC Glucose: 497 mg/dl — AB (ref 70–99)

## 2022-01-15 MED ORDER — DOXYCYCLINE HYCLATE 100 MG PO TABS: 100.0000 mg | ORAL_TABLET | Freq: Two times a day (BID) | ORAL | 0 refills | Status: AC

## 2022-01-15 MED ORDER — ACCU-CHEK SOFTCLIX LANCETS MISC: 200.0000 | Freq: Two times a day (BID) | 11 refills | Status: AC

## 2022-01-15 MED ORDER — ACCU-CHEK GUIDE VI STRP: ORAL_STRIP | 11 refills | Status: DC

## 2022-01-15 MED ORDER — MIRTAZAPINE 15 MG PO TABS: 15.0000 mg | ORAL_TABLET | Freq: Every day | ORAL | 1 refills | Status: DC

## 2022-01-15 MED ORDER — BLOOD GLUCOSE METER KIT: PACK | 0 refills | Status: DC

## 2022-01-15 NOTE — Progress Notes (Signed)
Provider: Marlowe Sax FNP-C  Melina Mosteller, [name], NP  Patient Care Team: Jaylynn Siefert, [name], NP as PCP - General (Family Medicine) Harl Bowie [name], MD as PCP - Cardiology (Cardiology) Seth Montane, RN as Case Manager  Extended Emergency Contact Information Primary Emergency Contact: Fountain,[place] Phone: [phone] Relation: Sister Secondary Emergency Contact: Cecille Aver, [place] [address] Johnnette Litter of [place] Phone: [phone] Relation: Aunt  Code Status: Full Code Goals of care: Advanced Directive information    01/01/2022   10:15 AM  Advanced Directives  Does Patient Have a Medical Advance Directive? No  Would patient like information on creating a medical advance directive? No - Patient declined     Chief Complaint  Patient presents with   Acute Visit    Patient presents today to discuss labs and follow-up.    HPI:  Pt is a 61 y.o. female seen today for an acute visit to discuss lab results.  Recent lab work reviewed and discussed during visit.  Patient's sister came in later during the visit and was updated on lab work. Glucose 598,A1C 13.3 states has not been using her Lantus or levemir since she does not have a glucometer.   BUN 34,CR 6.06 on dialysis   Sodium 127   Also complains of non-productive cough,runny nose,and sore throat X 5 days. She denies any fever,chills,body aches,chest tightness,chest pain,palpitation or shortness of breath.  States feels tired after dialysis " I do not want to do dialysis anymore". Though states does not eat breakfast prior to going to dialysis.States has no appetite. Weight down 5 lbs compared to previous visit     Past Medical History:  Diagnosis Date   Allergic rhinitis    Anxiety    CHF (congestive heart failure) ([place])    a. EF 30-35% by echo in 05/2020 b. EF at 45% in 09/2020   Chronic back pain    Chronic hepatitis C without hepatic coma ([place])    COVID-19 virus infection  12/26/2019   Depression    Diabetes mellitus    Hepatitis C    Hypertension    Noncompliance    Poor appetite 07/17/2014   Substance abuse ([place])    HX of drug use and alcohol use   Past Surgical History:  Procedure Laterality Date   APPENDECTOMY  2004   AV FISTULA PLACEMENT Left 11/26/2020   Procedure: LEFT ARM ARTERIOVENOUS (AV) FISTULA CREATION;  Surgeon: [name], MD;  Location: [facility];  Service: Vascular;  Laterality: Left;   AV FISTULA PLACEMENT Left 06/03/2021   Procedure: INSERTION OF LEFT UPPER ARM ARTERIOVENOUS (AV) GORE-TEX GRAFT;  Surgeon: [name], MD;  Location: [facility];  Service: Vascular;  Laterality: Left;   IR FLUORO GUIDE CV LINE RIGHT  12/25/2020   IR US GUIDE VASC ACCESS RIGHT  12/25/2020   LIGATION ARTERIOVENOUS GORTEX GRAFT Left 08/05/2021   Procedure: LIGATION OF LEFT ARM ARTERIOVENOUS GORTEX GRAFT;  Surgeon: [name], MD;  Location: [facility];  Service: Vascular;  Laterality: Left;    Allergies  Allergen Reactions   Penicillins Itching and Rash    Outpatient Encounter Medications as of 01/15/2022  Medication Sig   ACCU-CHEK GUIDE test strip Use to check glucose twice daily   Accu-Chek Softclix Lancets lancets 200 each by Other route 2 (two) times daily.   albuterol (VENTOLIN HFA) 108 (90 Base) MCG/ACT inhaler Inhale 2 puffs into the lungs every 4 (four) hours  as needed for wheezing or shortness of breath.   amLODipine (NORVASC) 10 MG tablet Take 10 mg by mouth daily.   blood glucose meter kit and supplies Dispense based on patient and insurance preference. Use to monitor glucose twice daily.   Cholecalciferol (VITAMIN D3) 25 MCG (1000 UT) CAPS Take 1 capsule by mouth daily.   COMFORT EZ PEN NEEDLES 32G X 4 MM MISC USE AS DIRECTED with insulin injections   insulin detemir (LEVEMIR FLEXTOUCH) 100 UNIT/ML FlexPen INJECT 35 UNITS into THE SKIN AT BEDTIME   insulin glargine (LANTUS) 100 UNIT/ML Solostar Pen Inject 35 Units into the skin at bedtime.    Insulin Pen Needle (PEN NEEDLES) 31G X 5 MM MISC Use to inject insulin once daily   isosorbide mononitrate (IMDUR) 30 MG 24 hr tablet TAKE ONE TABLET BY MOUTH EVERY MORNING   metoprolol succinate (TOPROL-XL) 50 MG 24 hr tablet TAKE ONE TABLET BY MOUTH EVERY EVENING   rosuvastatin (CRESTOR) 10 MG tablet TAKE ONE TABLET BY MOUTH EVERY MORNING   sevelamer carbonate (RENVELA) 800 MG tablet Take 800 mg by mouth 3 (three) times daily with meals.   No facility-administered encounter medications on file as of 01/15/2022.    Review of Systems  Constitutional:  Positive for fatigue and unexpected weight change. Negative for appetite change, chills and fever.       Weight loss   HENT:  Positive for rhinorrhea and sore throat. Negative for congestion, dental problem, ear discharge, ear pain, facial swelling, hearing loss, nosebleeds, postnasal drip, sinus pressure, sinus pain, sneezing, tinnitus and trouble swallowing.   Eyes:  Negative for pain, discharge, redness, itching and visual disturbance.  Respiratory:  Positive for cough. Negative for chest tightness, shortness of breath and wheezing.   Cardiovascular:  Negative for chest pain, palpitations and leg swelling.  Gastrointestinal:  Negative for abdominal distention, abdominal pain, blood in stool, constipation, diarrhea, nausea and vomiting.  Endocrine: Negative for cold intolerance, heat intolerance, polydipsia, polyphagia and polyuria.  Genitourinary:  Negative for difficulty urinating, dysuria, flank pain, frequency and urgency.       On dialysis   Musculoskeletal:  Negative for arthralgias, back pain, gait problem, joint swelling, myalgias, neck pain and neck stiffness.  Skin:  Negative for color change, pallor, rash and wound.  Neurological:  Negative for dizziness, syncope, speech difficulty, weakness, light-headedness, numbness and headaches.    Immunization History  Administered Date(s) Administered   H1N1 04/04/2008   Hepb-cpg  02/07/2021, 03/14/2021, 04/09/2021, 06/04/2021   Influenza Split 02/05/2011, 12/02/2011   Influenza Whole 12/10/2006, 01/08/2009   Influenza, Quadrivalent, Recombinant, Inj, Pf 12/26/2021   Influenza,inj,Quad PF,6+ Mos 02/12/2014, 03/27/2015, 03/05/2020, 01/22/2021   PFIZER(Purple Top)SARS-COV-2 Vaccination 09/29/2019   Pneumococcal Conjugate-13 06/11/2014   Pneumococcal Polysaccharide-23 12/05/2004, 10/30/2010   Td 08/09/2003   Pertinent  Health Maintenance Due  Topic Date Due   COLONOSCOPY (Pts 45-70yrs Insurance coverage will need to be confirmed)  Never done   OPHTHALMOLOGY EXAM  09/17/2021   MAMMOGRAM  11/23/2021   FOOT EXAM  05/12/2022   HEMOGLOBIN A1C  07/10/2022   PAP SMEAR-Modifier  10/03/2022   INFLUENZA VACCINE  Completed      11/04/2021    8:50 AM 11/04/2021   11:00 PM 11/05/2021    7:13 AM 01/01/2022   10:15 AM 01/15/2022    9:27 AM  Fall Risk  Falls in the past year?    0 1  Was there an injury with Fall?    0 0  Fall Risk  Category Calculator    0 1  Fall Risk Category    Low Low  Patient Fall Risk Level Moderate fall risk Moderate fall risk Moderate fall risk Low fall risk Low fall risk  Patient at Risk for Falls Due to    No Fall Risks History of fall(s)  Fall risk Follow up    Falls evaluation completed    Functional Status Survey:    Vitals:   01/15/22 0918  BP: 98/66  Pulse: 87  Resp: 18  Temp: (!) 97.5 F (36.4 C)  SpO2: 97%  Weight: 156 lb 3.2 oz (70.9 kg)  Height: _0  (1.676 m)   Body mass index is 25.21 kg/m. Physical Exam Vitals reviewed.  Constitutional:      General: She is not in acute distress.    Appearance: Normal appearance. She is overweight. She is not ill-appearing or diaphoretic.  HENT:     Head: Normocephalic.     Nose: Rhinorrhea present. No congestion.     Mouth/Throat:     Mouth: Mucous membranes are moist.     Pharynx: Oropharynx is clear. No oropharyngeal exudate or posterior oropharyngeal erythema.  Eyes:      General: No scleral icterus.       Right eye: No discharge.        Left eye: No discharge.     Extraocular Movements: Extraocular movements intact.     Conjunctiva/sclera: Conjunctivae normal.     Pupils: Pupils are equal, round, and reactive to light.  Neck:     Vascular: No carotid bruit.  Cardiovascular:     Rate and Rhythm: Normal rate and regular rhythm.     Pulses: Normal pulses.     Heart sounds: Normal heart sounds. No murmur heard.    No friction rub. No gallop.     Comments: Right upper chest dialysis  central line  Pulmonary:     Effort: Pulmonary effort is normal. No respiratory distress.     Breath sounds: Examination of the left-upper field reveals rales. Examination of the left-middle field reveals rales. Rales present. No wheezing or rhonchi.     Comments: Rales clears with cough Chest:     Chest wall: No tenderness.  Abdominal:     General: Bowel sounds are normal. There is no distension.     Palpations: Abdomen is soft. There is no mass.     Tenderness: There is no abdominal tenderness. There is no right CVA tenderness, left CVA tenderness, guarding or rebound.  Musculoskeletal:        General: No swelling or tenderness. Normal range of motion.     Cervical back: Normal range of motion. No rigidity or tenderness.     Right lower leg: No edema.     Left lower leg: No edema.  Lymphadenopathy:     Cervical: No cervical adenopathy.  Skin:    General: Skin is warm and dry.     Coloration: Skin is not pale.     Findings: No bruising, erythema, lesion or rash.  Neurological:     Mental Status: She is alert and oriented to person, place, and time.     Cranial Nerves: No cranial nerve deficit.     Sensory: No sensory deficit.     Motor: No weakness.     Coordination: Coordination normal.     Gait: Gait normal.  Psychiatric:        Mood and Affect: Mood normal.        Speech: Speech  normal.        Behavior: Behavior normal.     Labs reviewed: Recent Labs     11/04/21 0500 11/05/21 0614 01/08/22 0924  NA 139 134* 127*  K 4.2 3.8 4.8  CL 107 99 88*  CO2 _0 GLUCOSE 96 160* 598*  BUN 45* 37* 34*  CREATININE 7.37* 5.20* 6.06*  CALCIUM 8.6* 8.2* 9.4  MG 2.1 2.0  --    Recent Labs    11/03/21 0830 11/04/21 0500 11/05/21 0614 01/08/22 0924  AST 14* 12* 289* 13  ALT _1 ALKPHOS 83 66 134*  --   BILITOT 0.6 0.3 3.0* 0.5  PROT 7.3 6.1* 5.9* 7.6  ALBUMIN 3.3* 2.6* 2.5*  --    Recent Labs    11/04/21 0500 11/05/21 0614 01/08/22 0924  WBC 7.1 8.6 8.3  NEUTROABS 4.9 5.6 3,685  HGB 7.9* 8.2* 8.2*  HCT 29.2* 27.9* 30.6*  MCV 74.1* 72.1* 72.9*  PLT 183 137* 230   Lab Results  Component Value Date   TSH 1.84 01/08/2022   Lab Results  Component Value Date   HGBA1C 13.3 (H) 01/08/2022   Lab Results  Component Value Date   CHOL 168 01/08/2022   HDL 41 (L) 01/08/2022   LDLCALC 93 01/08/2022   TRIG 258 (H) 01/08/2022   CHOLHDL 4.1 01/08/2022    Significant Diagnostic Results in last 30 days:  No results found.  Assessment/Plan 1. Uncontrolled type 2 diabetes mellitus with hyperglycemia (H[facility]) Lab Results  Component Value Date   HGBA1C 13.3 (H) 01/08/2022  Not using her insulin as prescribed.medication use discussed wtih patient and sister during visit.sister will call her to remind her to use insulin and has a granddaughter to assist her.  - POC Glucose (CBG) 497 - will resend glucometer and supplies to pharmacy.sister will ensure patient picks up the supplies and check CBG daily then follow up in one week to reevaluate.  - request new referral to Endocrinologist here in [place] instead of [place] so that her POA can be able to attend all her provider visit.  - blood glucose meter kit and supplies; Dispense based on patient and insurance preference. Use to monitor glucose twice daily.  Dispense: 1 each; Refill: 0 - ACCU-CHEK GUIDE test strip; Use to check glucose twice daily  Dispense: 100 each; Refill:  11 - Accu-Chek Softclix Lancets lancets; 200 each by Other route 2 (two) times daily.  Dispense: 100 each; Refill: 11 - Ambulatory referral to Endocrinology  2. Acute cough Left mid and upper lobe rales clears with cough but concerning for infection.will treat with antibiotics as below.  - doxycycline (VIBRA-TABS) 100 MG tablet; Take 1 tablet (100 mg total) by mouth 2 (two) times daily for 7 days.  Dispense: 14 tablet; Refill: 0  3. Loss of appetite Start on Remeron for appetite stimulant.  - mirtazapine (REMERON) 15 MG tablet; Take 1 tablet (15 mg total) by mouth at bedtime.  Dispense: 30 tablet; Refill: 1  4. Weight loss Has had 5 lbs weight loss suspected due to poor oral intake and dialysis too.   Family/ staff Communication: Reviewed plan of care with patient and Sister verbalized understanding   Labs/tests ordered: None   Next Appointment: Return in about 1 week (around 01/22/2022) for Blood sugar .   Sandrea Hughs, NP

## 2022-01-15 NOTE — Patient Instructions (Addendum)
[facility] tel# [phone] to follow up Glucometer and supplies.

## 2022-01-16 ENCOUNTER — Telehealth: Payer: Self-pay | Admitting: *Deleted

## 2022-01-16 ENCOUNTER — Other Ambulatory Visit: Payer: Self-pay | Admitting: Family

## 2022-01-16 DIAGNOSIS — M79642 Pain in left hand: Secondary | ICD-10-CM

## 2022-01-16 NOTE — Telephone Encounter (Signed)
Patient sister, Freida Busman called and stated that patient's insurance will not cover patient's Blood sugar Meter.   Requesting a meter from the office. Informed her that we do not have meters in office and instructed her to call patient's insurance to get the name of the meter that they will cover and then we could send a Rx to the pharmacy.   She stated that she didn't feel like that was her responsibility and that it was too much work for her to do that but she will do it and call back.

## 2022-01-17 DIAGNOSIS — N179 Acute kidney failure, unspecified: Secondary | ICD-10-CM | POA: Diagnosis not present

## 2022-01-18 DIAGNOSIS — N179 Acute kidney failure, unspecified: Secondary | ICD-10-CM | POA: Diagnosis not present

## 2022-01-20 ENCOUNTER — Encounter (HOSPITAL_COMMUNITY): Payer: Self-pay | Admitting: Emergency Medicine

## 2022-01-20 ENCOUNTER — Other Ambulatory Visit: Payer: Self-pay

## 2022-01-20 ENCOUNTER — Emergency Department (HOSPITAL_COMMUNITY)
Admission: EM | Admit: 2022-01-20 | Discharge: 2022-01-20 | Disposition: A | Discharge status: Home / Self Care | Payer: Medicaid Other | Attending: Emergency Medicine | Admitting: Emergency Medicine

## 2022-01-20 DIAGNOSIS — Z992 Dependence on renal dialysis: Secondary | ICD-10-CM | POA: Insufficient documentation

## 2022-01-20 DIAGNOSIS — R899 Unspecified abnormal finding in specimens from other organs, systems and tissues: Secondary | ICD-10-CM

## 2022-01-20 DIAGNOSIS — N186 End stage renal disease: Secondary | ICD-10-CM | POA: Insufficient documentation

## 2022-01-20 DIAGNOSIS — R799 Abnormal finding of blood chemistry, unspecified: Secondary | ICD-10-CM | POA: Diagnosis present

## 2022-01-20 DIAGNOSIS — R739 Hyperglycemia, unspecified: Secondary | ICD-10-CM

## 2022-01-20 DIAGNOSIS — E1122 Type 2 diabetes mellitus with diabetic chronic kidney disease: Secondary | ICD-10-CM | POA: Insufficient documentation

## 2022-01-20 DIAGNOSIS — E1165 Type 2 diabetes mellitus with hyperglycemia: Secondary | ICD-10-CM | POA: Diagnosis not present

## 2022-01-20 LAB — COMPREHENSIVE METABOLIC PANEL
ALT: 15 U/L (ref 0–44)
AST: 14 U/L — ABNORMAL LOW (ref 15–41)
Albumin: 3.2 g/dL — ABNORMAL LOW (ref 3.5–5.0)
Alkaline Phosphatase: 78 U/L (ref 38–126)
Anion gap: 14 (ref 5–15)
BUN: 34 mg/dL — ABNORMAL HIGH (ref 6–20)
CO2: 23 mmol/L (ref 22–32)
Calcium: 9.1 mg/dL (ref 8.9–10.3)
Chloride: 95 mmol/L — ABNORMAL LOW (ref 98–111)
Creatinine, Ser: 6.42 mg/dL — ABNORMAL HIGH (ref 0.44–1.00)
GFR, Estimated: 7 mL/min — ABNORMAL LOW (ref >60)
Glucose, Bld: 504 mg/dL (ref 70–99)
Potassium: 3.9 mmol/L (ref 3.5–5.1)
Sodium: 132 mmol/L — ABNORMAL LOW (ref 135–145)
Total Bilirubin: 0.5 mg/dL (ref 0.3–1.2)
Total Protein: 7.4 g/dL (ref 6.5–8.1)

## 2022-01-20 LAB — CBC WITH DIFFERENTIAL/PLATELET
Abs Immature Granulocytes: 0.02 10*3/uL (ref 0.00–0.07)
Basophils Absolute: 0 10*3/uL (ref 0.0–0.1)
Basophils Relative: 1 %
Eosinophils Absolute: 0.1 10*3/uL (ref 0.0–0.5)
Eosinophils Relative: 2 %
HCT: 29.9 % — ABNORMAL LOW (ref 36.0–46.0)
Hemoglobin: 8.4 g/dL — ABNORMAL LOW (ref 12.0–15.0)
Immature Granulocytes: 0 %
Lymphocytes Relative: 40 %
Lymphs Abs: 2.5 10*3/uL (ref 0.7–4.0)
MCH: 20.6 pg — ABNORMAL LOW (ref 26.0–34.0)
MCHC: 28.1 g/dL — ABNORMAL LOW (ref 30.0–36.0)
MCV: 73.5 fL — ABNORMAL LOW (ref 80.0–100.0)
Monocytes Absolute: 0.6 10*3/uL (ref 0.1–1.0)
Monocytes Relative: 9 %
Neutro Abs: 2.9 10*3/uL (ref 1.7–7.7)
Neutrophils Relative %: 48 %
Platelets: 194 10*3/uL (ref 150–400)
RBC: 4.07 MIL/uL (ref 3.87–5.11)
RDW: 22.8 % — ABNORMAL HIGH (ref 11.5–15.5)
WBC: 6.2 10*3/uL (ref 4.0–10.5)
nRBC: 0 % (ref 0.0–0.2)

## 2022-01-20 LAB — TYPE AND SCREEN
ABO/RH(D): A POS
Antibody Screen: NEGATIVE

## 2022-01-20 LAB — BETA-HYDROXYBUTYRIC ACID: Beta-Hydroxybutyric Acid: 0.1 mmol/L (ref 0.05–0.27)

## 2022-01-20 LAB — CBG MONITORING, ED
Glucose-Capillary: 460 mg/dL — ABNORMAL HIGH (ref 70–99)
Glucose-Capillary: 407 mg/dL — ABNORMAL HIGH (ref 70–99)

## 2022-01-20 MED ORDER — BLOOD GLUCOSE MONITOR KIT: PACK | 0 refills | Status: DC

## 2022-01-20 MED ORDER — INSULIN ASPART 100 UNIT/ML IJ SOLN
5.0000 [IU] | Freq: Once | INTRAMUSCULAR | Status: AC
  Administered 2022-01-20: 5 [IU] via SUBCUTANEOUS
  Filled 2022-01-20: qty 1

## 2022-01-20 NOTE — ED Triage Notes (Signed)
Pt took full dialysis yesterday. Was called by [facility] today due to h/h was "very low" and to come to ED. Pt a/o. Nad.

## 2022-01-20 NOTE — Discharge Instructions (Signed)
It was a pleasure take care of you today!  Your sugar was elevated today in the ED.  This was treated with 5 units of insulin.  It is important that you pick up the glucometer that was prescribed today.  Also important to keep a log of your blood sugar findings and take your insulin as prescribed previously by your primary care provider.  Attached is information for nutrition with diabetes.  Call your primary care provider today and set up a follow-up appointment regarding today's ED visit for this week or early next week.  Return to the emergency department if you experience increasing/worsening symptoms.

## 2022-01-20 NOTE — ED Provider Notes (Addendum)
[facility] EMERGENCY DEPARTMENT Provider Note   CSN: [id number] Arrival date & time: 01/20/22  1301     History  No chief complaint on file.   Latoya Walsh is a 61 y.o. female with a past medical history of ESRD (on dialysis Monday Wednesday Friday) who presents emergency department concerns for abnormal lab.  Patient notes that she was able to complete her full dialysis session yesterday.  Was called today by her dialysis center due to the concerns for her low hemoglobin/hematocrit at 6 and instructed to come to the emergency department.  Patient has associated lightheadedness.  Patient denies any medications tried prior to arrival.  Patient notes that she has a history of anemia however is not on iron supplementation at this time.  Denies chest pain, shortness of breath, palpitations, dizziness, nausea, vomiting.  The history is provided by the patient. No language interpreter was used.       Home Medications Prior to Admission medications   Medication Sig Start Date End Date Taking? Authorizing Provider  albuterol (VENTOLIN HFA) 108 (90 Base) MCG/ACT inhaler Inhale 2 puffs into the lungs every 4 (four) hours as needed for wheezing or shortness of breath. 11/05/21  Yes Shah, Pratik D, DO  amLODipine (NORVASC) 10 MG tablet Take 10 mg by mouth daily. 02/10/21  Yes [provider]  blood glucose meter kit and supplies KIT Dispense based on patient and insurance preference. Use up to four times daily as directed. 01/20/22  Yes Briann Sarchet A, PA-C  Cholecalciferol (VITAMIN D3) 25 MCG (1000 UT) CAPS Take 1 capsule by mouth daily. 02/10/21  Yes [provider]  doxycycline (VIBRA-TABS) 100 MG tablet Take 1 tablet (100 mg total) by mouth 2 (two) times daily for 7 days. 01/15/22 01/22/22 Yes [name], [name], NP  insulin detemir (LEVEMIR FLEXTOUCH) 100 UNIT/ML FlexPen INJECT 35 UNITS into THE SKIN AT BEDTIME 01/01/22  Yes [name], [name], NP  isosorbide mononitrate  (IMDUR) 30 MG 24 hr tablet TAKE ONE TABLET BY MOUTH EVERY MORNING 12/29/21  Yes Branch, [name], MD  metoprolol succinate (TOPROL-XL) 50 MG 24 hr tablet TAKE ONE TABLET BY MOUTH EVERY EVENING 12/29/21  Yes Branch, [name], MD  mirtazapine (REMERON) 15 MG tablet Take 1 tablet (15 mg total) by mouth at bedtime. 01/15/22  Yes [name], [name], NP  rosuvastatin (CRESTOR) 10 MG tablet TAKE ONE TABLET BY MOUTH EVERY MORNING 12/29/21  Yes Branch, [name], MD  sevelamer carbonate (RENVELA) 800 MG tablet Take 800 mg by mouth 3 (three) times daily with meals.   Yes [provider]  ACCU-CHEK GUIDE test strip Use to check glucose twice daily 01/15/22   [name], [name], NP  Accu-Chek Softclix Lancets lancets 200 each by Other route 2 (two) times daily. 01/15/22 02/14/22  [name], [name], NP  blood glucose meter kit and supplies Dispense based on patient and insurance preference. Use to monitor glucose twice daily. 01/15/22   [name], [name], NP  COMFORT EZ PEN NEEDLES 32G X 4 MM MISC USE AS DIRECTED with insulin injections 12/29/21   Reardon, [name], NP  insulin glargine (LANTUS) 100 UNIT/ML Solostar Pen Inject 35 Units into the skin at bedtime. Patient not taking: Reported on 01/15/2022 01/01/22   [name], [name], NP  Insulin Pen Needle (PEN NEEDLES) 31G X 5 MM MISC Use to inject insulin once daily 11/04/20   [name], NP      Allergies    Penicillins  Review of Systems   Review of Systems  All other systems reviewed and are negative.   Physical Exam Updated Vital Signs BP (!) 152/76   Pulse 70   Temp 98.2 F (36.8 C) (Oral)   Resp 18   LMP 06/23/2010   SpO2 100%  Physical Exam Vitals and nursing note reviewed.  Constitutional:      General: She is not in acute distress.    Appearance: She is not diaphoretic.  HENT:     Head: Normocephalic and atraumatic.     Mouth/Throat:     Pharynx: No oropharyngeal exudate.  Eyes:     General: No scleral icterus.     Conjunctiva/sclera: Conjunctivae normal.  Cardiovascular:     Rate and Rhythm: Normal rate and regular rhythm.     Pulses: Normal pulses.     Heart sounds: Normal heart sounds.  Pulmonary:     Effort: Pulmonary effort is normal. No respiratory distress.     Breath sounds: Normal breath sounds. No wheezing.  Abdominal:     General: Bowel sounds are normal.     Palpations: Abdomen is soft. There is no mass.     Tenderness: There is no abdominal tenderness. There is no guarding or rebound.  Musculoskeletal:        General: Normal range of motion.     Cervical back: Normal range of motion and neck supple.  Skin:    General: Skin is warm and dry.  Neurological:     Mental Status: She is alert.  Psychiatric:        Behavior: Behavior normal.     ED Results / Procedures / Treatments   Labs (all labs ordered are listed, but only abnormal results are displayed) Labs Reviewed  CBC WITH DIFFERENTIAL/PLATELET - Abnormal; Notable for the following components:      Result Value   Hemoglobin 8.4 (*)    HCT 29.9 (*)    MCV 73.5 (*)    MCH 20.6 (*)    MCHC 28.1 (*)    RDW 22.8 (*)    All other components within normal limits  COMPREHENSIVE METABOLIC PANEL - Abnormal; Notable for the following components:   Sodium 132 (*)    Chloride 95 (*)    Glucose, Bld 504 (*)    BUN 34 (*)    Creatinine, Ser 6.42 (*)    Albumin 3.2 (*)    AST 14 (*)    GFR, Estimated 7 (*)    All other components within normal limits  CBG MONITORING, ED - Abnormal; Notable for the following components:   Glucose-Capillary 460 (*)    All other components within normal limits  CBG MONITORING, ED - Abnormal; Notable for the following components:   Glucose-Capillary 407 (*)    All other components within normal limits  BETA-HYDROXYBUTYRIC ACID  TYPE AND SCREEN    EKG None  Radiology No results found.  Procedures Procedures    Medications Ordered in ED Medications  insulin aspart (novoLOG) injection  5 Units (5 Units Subcutaneous Given 01/20/22 1357)    ED Course/ Medical Decision Making/ A&P Clinical Course as of 01/20/22 1625  Tue Jan 20, 2022  1609 Glucose-Capillary(!): 407 Repeat CBG taken while patient eating [SB]  1616 Discussed with patient and family discharge treatment plan.  In-depth conversation held with patient and family member at bedside regarding nutrition along with diabetes.  Also instructed patient that she needs to take her medications as prescribed as well as pick up  her glucometer.  Instructed that patient needs to follow-up with her primary care provider either this week or early next week regarding today's ED visit.  Answered all available questions.  Patient appears safe for discharge at this time. [SB]    Clinical Course User Index [SB] Gabryella Murfin A, PA-C                           Medical Decision Making Amount and/or Complexity of Data Reviewed Labs: ordered. Decision-making details documented in ED Course.  Risk OTC drugs. Prescription drug management.   Pt presents with concerns for abnormal lab onset today.  Patient was called by her dialysis center and told that her hemoglobin was at 6 and that she need to be evaluated in the emergency department.  Patient denies concerns for hematic emesis, hematochezia.  Patient afebrile.  On exam patient without acute cardiovascular, respiratory, down exam findings.  Differential diagnosis includes anemia, electrolyte abnormality, hypoglycemia, hyperglycemia, DKA.    Co morbidities that complicate the patient evaluation: Type 2 diabetes ESRD on dialysis Monday Wednesday Friday   Labs:  I ordered, and personally interpreted labs.  The pertinent results include:   Initial CBG of 460, repeat CBG of 407 (patient noted to be eating a sandwich and chips prior to the repeat CBG). Beta hydroxybutyrate acid unremarkable. CMP without hyperkalemia, and with hyperglycemia CBC without leukocytosis, hemoglobin at 8.4 and  improved from previous value of 8.212 days ago   Medications:  I ordered medication including 5 units of NovoLog for hyperglycemia management Reevaluation of the patient after these medicines and interventions, I reevaluated the patient and found that they have improved I have reviewed the patients home medicines and have made adjustments as needed   Disposition: Presentation suspicious for hyperglycemia.  Doubt anemia, electrolyte abnormality, DKA at this time. After consideration of the diagnostic results and the patients response to treatment, I feel that the patient would benefit from Discharge home. Case discussed with attending who evaluated patient and agrees with discharge treatment plan. Will prescribe glucometer today. Supportive care measures and strict return precautions discussed with patient at bedside. Pt acknowledges and verbalizes understanding. Pt appears safe for discharge. Follow up as indicated in discharge paperwork.    This chart was dictated using voice recognition software, Dragon. Despite the best efforts of this provider to proofread and correct errors, errors may still occur which can change documentation meaning.   Final Clinical Impression(s) / ED Diagnoses Final diagnoses:  Abnormal laboratory test  Elevated blood sugar level    Rx / DC Orders ED Discharge Orders          Ordered    blood glucose meter kit and supplies KIT        01/20/22 1624              Rowan Pollman A, PA-C 01/20/22 1624    Viona Hosking A, PA-C 01/20/22 1625    Isla Pence, MD 01/23/22 [phone]

## 2022-01-22 ENCOUNTER — Other Ambulatory Visit: Payer: Self-pay | Admitting: *Deleted

## 2022-01-22 ENCOUNTER — Encounter: Payer: Medicaid Other | Admitting: Family

## 2022-01-22 NOTE — Patient Outreach (Signed)
Medicaid Managed Care   Nurse Care Manager Note  01/22/2022 Name:  [name] MRN:  [id number] DOB:  1960-10-05  Latoya Walsh is an 61 y.o. year old female who is Walsh primary patient of [name], [name], NP.  The [facility] Managed Care Coordination team was consulted for assistance with:    CHF DMII  Latoya Walsh was given information about Medicaid Managed Care Coordination team services today. Latoya Walsh Patient agreed to services and verbal consent obtained.  Engaged with patient by telephone for follow up visit in response to provider referral for case management and/or care coordination services.   Assessments/Interventions:  Review of past medical history, allergies, medications, health status, including review of consultants reports, laboratory and other test data, was performed as part of comprehensive evaluation and provision of chronic care management services.  SDOH (Social Determinants of Health) assessments and interventions performed: SDOH Interventions    Flowsheet Row Patient Outreach Telephone from 08/21/2021 in [place] Patient Outreach Telephone from 07/31/2021 in [employer] Patient Outreach Telephone from 07/10/2021 in [place] Patient Outreach Telephone from 06/26/2021 in [place] Patient Outreach Telephone from 02/28/2021 in [place] Patient Outreach Telephone from 02/06/2021 in [place] Interventions        Food Insecurity Interventions -- Other (Comment)  [Advised patient to recertify for food benefits] -- -- -- Intervention Not Indicated  [Patient has ample amount of food at this time.]  Housing Interventions -- -- Intervention Not Indicated  [Patient is currently living with her daughter] -- -- Other  (Comment)  Engineer, maintenance Guide referral for assistance with heat]  Transportation Interventions -- Other (Comment)  [Provided patient with UHC transportation [id number] -- -- -- --  Depression Interventions/Treatment  -- -- -- Medication --  [Referral to MM LCSW, [place]] --  Stress Interventions Provide Counseling, [employer] -- -- -- -- --       Care Plan  Allergies  Allergen Reactions   Penicillins Itching and Rash    Medications Reviewed Today     Reviewed by Marionna Montane, RN (Registered Nurse) on 01/22/22 at 1658  Med List Status: <None>   Medication Order Taking? Sig Documenting Provider Last Dose Status Informant  ACCU-CHEK GUIDE test strip [id number] No Use to check glucose twice daily  Patient not taking: Reported on 01/22/2022   [name], [name], NP Not Taking Active Self, Pharmacy Records  Accu-Chek Softclix Lancets lancets [id number] No 200 each by Other route 2 (two) times daily.  Patient not taking: Reported on 01/22/2022   [name], [name], NP Not Taking Active Self, Pharmacy Records  albuterol (VENTOLIN HFA) 108 (90 Base) MCG/ACT inhaler [id number] No Inhale 2 puffs into the lungs every 4 (four) hours as needed for wheezing or shortness of breath. Latoya Lark D, DO Unknown Active Self, Pharmacy Records  amLODipine (NORVASC) 10 MG tablet [id number] Yes Take 10 mg by mouth daily. [provider] Taking Active Self, Pharmacy Records           Med Note Latoya Walsh, [name]   Thu Jan 01, 2022 10:10 AM)    blood glucose meter kit and supplies [id number] No Dispense based on patient and insurance preference. Use to monitor glucose twice daily.  Patient not taking: Reported on 01/22/2022   [name], [name], NP Not Taking Active Self, Pharmacy Records  blood glucose meter kit and supplies KIT [id number] No Dispense based on patient and insurance preference. Use up to four times daily as directed.  Patient not taking: Reported on 01/22/2022   Blue,  Latoya A, PA-Walsh Not Taking Active   Cholecalciferol (VITAMIN D3) 25 MCG (1000 UT) CAPS [id number]  Take 1 capsule by mouth daily. [provider]  Active Self, Pharmacy Records           Med Note Latoya Walsh, Latoya Walsh Jan 01, 2022 10:11 AM)    COMFORT EZ PEN NEEDLES 32G X 4 MM MISC [id number] No USE AS DIRECTED with insulin injections  Patient not taking: Reported on 01/22/2022   [name], NP Not Taking Active Self, Pharmacy Records  doxycycline (VIBRA-TABS) 100 MG tablet [id number] Yes Take 1 tablet (100 mg total) by mouth 2 (two) times daily for 7 days. [name], [name], NP Taking Active Self, Pharmacy Records  insulin detemir (LEVEMIR FLEXTOUCH) 100 UNIT/ML FlexPen [id number] No INJECT 35 UNITS into THE SKIN AT BEDTIME  Patient not taking: Reported on 01/22/2022   [name], [name], NP Not Taking Active Self, Pharmacy Records  insulin glargine (LANTUS) 100 UNIT/ML Solostar Pen [id number] No Inject 35 Units into the skin at bedtime.  Patient not taking: Reported on 01/15/2022   [name], [name], NP Not Taking Active Self, Pharmacy Records  Insulin Pen Needle (PEN NEEDLES) 31G X 5 MM MISC [id number] No Use to inject insulin once daily  Patient not taking: Reported on 01/22/2022   [name], NP Not Taking Active Self, Pharmacy Records  isosorbide mononitrate (IMDUR) 30 MG 24 hr tablet [id number] Yes TAKE ONE TABLET BY MOUTH EVERY MORNING Branch, [name], MD Taking Active Self, Pharmacy Records  metoprolol succinate (TOPROL-XL) 50 MG 24 hr tablet [id number] Yes TAKE ONE TABLET BY MOUTH EVERY [name], MD Taking Active Self, Pharmacy Records  mirtazapine (REMERON) 15 MG tablet [id number] Yes Take 1 tablet (15 mg total) by mouth at bedtime. [name], [name], NP Taking Active Self, Pharmacy Records  rosuvastatin (CRESTOR) 10 MG tablet [id number] Yes TAKE ONE TABLET BY MOUTH EVERY MORNING Branch, [name], MD Taking Active Self, Pharmacy Records  sevelamer  carbonate (RENVELA) 800 MG tablet [id number] Yes Take 800 mg by mouth 3 (three) times daily with meals. [provider] Taking Active Self, Pharmacy Records           Med Note Latoya Walsh, Latoya Walsh Jan 01, 2022 10:12 AM)    Med List Note Latoya Walsh, [place] [birth year] [id number]):              Patient Active Problem List   Diagnosis Date Noted   ESRD (end stage renal disease) (Latoya Walsh) 01/06/2021   CHF (congestive heart failure) ([place]) 06/06/2020   Leg swelling 04/28/2020   Hyperlipidemia LDL goal <100 04/28/2020   Left hand pain 03/10/2020   Anemia 02/19/2020   COVID-19 virus infection 12/26/2019   Depression, major, single episode, in partial remission ([place]) 01/25/2019   Headache 11/13/2018   Insomnia 09/25/2018   Screening for colorectal cancer 07/23/2016   Lumbar back pain with radiculopathy affecting right lower extremity 03/27/2015   Non compliance with medical treatment 07/22/2014   Chronic hepatitis Walsh without hepatic coma ([place]) 12/04/2013   Allergic rhinitis 11/03/2012   Anxiety and depression 02/05/2011   Nicotine dependence 11/01/2010   Dermatitis 10/30/2010   Drug dependence, continuous abuse ([place]) 03/22/2010   Uncontrolled type 2 diabetes mellitus  with hyperglycemia ([place]) 06/02/2007   Alcohol abuse 06/02/2007    Conditions to be addressed/monitored per PCP order:  CHF and DMII  Care Plan : RN Care Manager Plan of Care  Updates made by Makaylyn Montane, RN since 01/22/2022 12:00 AM     Problem: Health Management needs related to DMII and HF      Long-Range Goal: Development of Plan of Care to address Health Management needs related to DMII and HF   Start Date: 07/10/2021  Expected End Date: 03/23/2022  Priority: High  Note:   Current Barriers:  Chronic Disease Management support and education needs related to CHF and DMII RNCM unable to reach [name] on her line, call made to her daughter who was with [name]. RNCM spoke to Ms. Fogleman who reports she  needs to get Walsh new glucometer. Call made to pharmacy, patient's daughter became involved and found patient's glucometer, which does need batteries. Insulin has not been refilled since May 2023. During this call RNCM computer froze and lost both calls to pharmacy and [name]. RNCM was able to rereach Ms. Currington daughter.   RNCM Clinical Goal(s):  Patient will verbalize understanding of plan for management of CHF and DMII as evidenced by patient verbalization of self monitoring activities take all medications exactly as prescribed and will call provider for medication related questions as evidenced by documentation in EMR    attend all scheduled medical appointments: Dialysis on M/W/F,  rescheduling missed appointment with PCP, and 02/03/22 with GYN as evidenced by provider documentation          Interventions: Inter-disciplinary care team collaboration (see longitudinal plan of care) Evaluation of current treatment plan related to  self management and patient's adherence to plan as established by provider [facility] collaborated with Pharmacy for patient glucometer/lancet needs Collaborated with patient's daughter regarding needing insulin and working glucometer-she will get new batteries for monitor Advised patient to reschedule missed PCP appointment from today Provided number to [facility] [phone]    Heart Failure Interventions:  (Status: Goal on track: NO.)  Long Term Goal  Wt Readings from Last 3 Encounters:  11/05/21 143 lb 15.4 oz (65.3 kg)  10/29/21 144 lb 9.6 oz (65.6 kg)  08/14/21 168 lb 1.9 oz (76.3 kg)   Basic overview and discussion of pathophysiology of Heart Failure reviewed Provided education on low sodium diet Discussed the importance of keeping all appointments with provider Assessed social determinant of health barriers  Diabetes:  (Status: Goal on track: NO.) Long Term Goal -BS today checked by patient's daughter indicates too high to read  Lab Results  Component  Value Date   HGBA1C >15 08/14/2021   @ Assessed patient's understanding of A1c goal: <8% Provided education to patient about basic DM disease process; Counseled on importance of regular laboratory monitoring as prescribed;        Discussed plans with patient for ongoing care management follow up and provided patient with direct contact information for care management team;      Provided patient with written educational materials related to hypo and hyperglycemia and importance of correct treatment;       Reviewed scheduled/upcoming provider appointments including: reschedule missed appointment with PCP and Adult Medicine and 02/03/22 with GYN;         Review of patient status, including review of consultants reports, relevant laboratory and other test results, and medications completed;       Advised patient's family to change batteries in current glucometer Advised patient  to drink water and recheck BS, if BS does not improve, seek medical attention   Patient Goals/Self-Care Activities: Take medications as prescribed   Attend all scheduled provider appointments Call provider office for new concerns or questions  use salt in moderation eat more whole grains, fruits and vegetables, lean meats and healthy fats dress right for the weather, hot or cold Complete requested lab work       Follow Up:  Patient agrees to Care Plan and Follow-up.  Plan: The Managed Medicaid care management team will reach out to the patient again over the next 7 days.  Date/time of next scheduled RN care management/care coordination outreach:  01/29/22 @ [place] RN, [place] RN Care Coordinator

## 2022-01-22 NOTE — Progress Notes (Signed)
  This encounter was created in error - please disregard. No show 

## 2022-01-22 NOTE — Patient Instructions (Signed)
Visit Information  [name] was given information about Medicaid Managed Care team care coordination services as a part of their [place] Medicaid benefit. Latoya Walsh verbally consented to engagement with the [facility] Managed Care team.   If you are experiencing a medical emergency, please call 911 or report to your local emergency department or urgent care.   If you have a non-emergency medical problem during routine business hours, please contact your provider's office and ask to speak with a nurse.   For questions related to your [facility], please call: [phone] or visit the homepage here: [email or web address]  If you would like to schedule transportation through your [facility], please call the following number at least 2 days in advance of your appointment: [phone]   Rides for urgent appointments can also be made after hours by calling Member Services.  Call the [place] at [phone], at any time, 24 hours a day, 7 days a week. If you are in danger or need immediate medical attention call 911.  If you would like help to quit smoking, call 1-800-QUIT-NOW (743)266-5909) OR Espaol: 1-855-Djelo-Ya (3-710-626-9485) o para ms informacin haga clic aqu or Text READY to 200-400 to register via text  [name],   Please see education materials related to diabetes provided by MyChart link.  Patient verbalizes understanding of instructions and care plan provided today and agrees to view in [place]. Active MyChart status and patient understanding of how to access instructions and care plan via MyChart confirmed with patient.     Telephone follow up appointment with Managed Medicaid care management team member scheduled for:01/29/22 @ [place] RN, [place] RN Care  Coordinator   Following is a copy of your plan of care:  Care Plan : RN Care Manager Plan of Care  Updates made by Avaiah Montane, RN since 01/22/2022 12:00 AM     Problem: Health Management needs related to DMII and HF      Long-Range Goal: Development of Plan of Care to address Health Management needs related to DMII and HF   Start Date: 07/10/2021  Expected End Date: 03/23/2022  Priority: High  Note:   Current Barriers:  Chronic Disease Management support and education needs related to CHF and DMII RNCM unable to reach [name] on her line, call made to her daughter who was with [name]. RNCM spoke to Latoya Walsh who reports she needs to get a new glucometer. Call made to pharmacy, patient's daughter became involved and found patient's glucometer, which does need batteries. Insulin has not been refilled since May 2023. During this call RNCM computer froze and lost both calls to pharmacy and [name]. RNCM was able to rereach Latoya Walsh daughter.   RNCM Clinical Goal(s):  Patient will verbalize understanding of plan for management of CHF and DMII as evidenced by patient verbalization of self monitoring activities take all medications exactly as prescribed and will call provider for medication related questions as evidenced by documentation in EMR    attend all scheduled medical appointments: Dialysis on M/W/F,  rescheduling missed appointment with PCP, and 02/03/22 with GYN as evidenced by provider documentation          Interventions: Inter-disciplinary care team collaboration (see longitudinal plan of care) Evaluation of current treatment plan related to  self management and patient's adherence to plan as established by provider [facility] collaborated with Pharmacy  for patient glucometer/lancet needs Collaborated with patient's daughter regarding needing insulin and working glucometer-she will get new batteries for monitor Advised patient to reschedule missed PCP appointment from  today Provided number to [facility] [phone]    Heart Failure Interventions:  (Status: Goal on track: NO.)  Long Term Goal  Wt Readings from Last 3 Encounters:  11/05/21 143 lb 15.4 oz (65.3 kg)  10/29/21 144 lb 9.6 oz (65.6 kg)  08/14/21 168 lb 1.9 oz (76.3 kg)   Basic overview and discussion of pathophysiology of Heart Failure reviewed Provided education on low sodium diet Discussed the importance of keeping all appointments with provider Assessed social determinant of health barriers  Diabetes:  (Status: Goal on track: NO.) Long Term Goal -BS today checked by patient's daughter indicates too high to read  Lab Results  Component Value Date   HGBA1C >15 08/14/2021   @ Assessed patient's understanding of A1c goal: <8% Provided education to patient about basic DM disease process; Counseled on importance of regular laboratory monitoring as prescribed;        Discussed plans with patient for ongoing care management follow up and provided patient with direct contact information for care management team;      Provided patient with written educational materials related to hypo and hyperglycemia and importance of correct treatment;       Reviewed scheduled/upcoming provider appointments including: reschedule missed appointment with PCP and Adult Medicine and 02/03/22 with GYN;         Review of patient status, including review of consultants reports, relevant laboratory and other test results, and medications completed;       Advised patient's family to change batteries in current glucometer Advised patient to drink water and recheck BS, if BS does not improve, seek medical attention   Patient Goals/Self-Care Activities: Take medications as prescribed   Attend all scheduled provider appointments Call provider office for new concerns or questions  use salt in moderation eat more whole grains, fruits and vegetables, lean meats and healthy fats dress right for the weather, hot or  cold Complete requested lab work

## 2022-01-23 ENCOUNTER — Telehealth: Payer: Self-pay

## 2022-01-23 ENCOUNTER — Other Ambulatory Visit: Payer: Self-pay | Admitting: Family

## 2022-01-23 DIAGNOSIS — E1165 Type 2 diabetes mellitus with hyperglycemia: Secondary | ICD-10-CM

## 2022-01-23 NOTE — Telephone Encounter (Signed)
Zykeria with [facility] called needing to know which insulin patient should have. Prescription sent in on 01/01/22 for both Levemir and Lantus with exact same directions.  Please clarify.  Message routed to Marlowe Sax, NP

## 2022-01-23 NOTE — Telephone Encounter (Signed)
Refill Lantus 35 units SQ at bedtime

## 2022-01-23 NOTE — Telephone Encounter (Signed)
Called and spoke with pharmacy to fill Lantus

## 2022-01-24 DIAGNOSIS — N179 Acute kidney failure, unspecified: Secondary | ICD-10-CM | POA: Diagnosis not present

## 2022-01-25 DIAGNOSIS — N179 Acute kidney failure, unspecified: Secondary | ICD-10-CM | POA: Diagnosis not present

## 2022-01-29 ENCOUNTER — Other Ambulatory Visit: Payer: Medicaid Other | Admitting: *Deleted

## 2022-01-29 ENCOUNTER — Encounter: Payer: Self-pay | Admitting: *Deleted

## 2022-01-29 NOTE — Patient Outreach (Signed)
Medicaid Managed Care   Nurse Care Manager Note  01/29/2022 Name:  Latoya Walsh MRN:  [id number] DOB:  Aug 27, 1960  Latoya Walsh is an 61 y.o. year old female who is a primary patient of [name], [name], NP.  The [facility] Managed Care Coordination team was consulted for assistance with:    CHF DMII  [name] was given information about Medicaid Managed Care Coordination team services today. Latoya Walsh Patient agreed to services and verbal consent obtained.  Engaged with patient by telephone for follow up visit in response to provider referral for case management and/or care coordination services.   Assessments/Interventions:  Review of past medical history, allergies, medications, health status, including review of consultants reports, laboratory and other test data, was performed as part of comprehensive evaluation and provision of chronic care management services.  SDOH (Social Determinants of Health) assessments and interventions performed: SDOH Interventions    Flowsheet Row Patient Outreach Telephone from 08/21/2021 in [place] Patient Outreach Telephone from 07/31/2021 in [place] Patient Outreach Telephone from 07/10/2021 in [place] Patient Outreach Telephone from 06/26/2021 in [place] Patient Outreach Telephone from 02/28/2021 in [place] Patient Outreach Telephone from 02/06/2021 in [place] Interventions        Food Insecurity Interventions -- Other (Comment)  [Advised patient to recertify for food benefits] -- -- -- Intervention Not Indicated  [Patient has ample amount of food at this time.]  Housing Interventions -- -- Intervention Not Indicated  [Patient is currently living with her daughter] -- -- Other  (Comment)  Engineer, maintenance Guide referral for assistance with heat]  Transportation Interventions -- Other (Comment)  [Provided patient with UHC transportation [phone] -- -- -- --  Depression Interventions/Treatment  -- -- -- Medication --  [Referral to MM LCSW, [place]] --  Stress Interventions Provide Counseling, [employer] -- -- -- -- --       Care Plan  Allergies  Allergen Reactions   Penicillins Itching and Rash    Medications Reviewed Today     Reviewed by Jahnyla Montane, RN (Registered Nurse) on 01/29/22 at [place] List Status: <None>   Medication Order Taking? Sig Documenting Provider Last Dose Status Informant  ACCU-CHEK GUIDE test strip [id number] Yes Use to check glucose twice daily [name], [name], NP Taking Active Self, Pharmacy Records  Accu-Chek Softclix Lancets lancets [id number] Yes 200 each by Other route 2 (two) times daily. [name], [name], NP Taking Active Self, Pharmacy Records  albuterol (VENTOLIN HFA) 108 (90 Base) MCG/ACT inhaler [id number] Yes Inhale 2 puffs into the lungs every 4 (four) hours as needed for wheezing or shortness of breath. [name], DO Taking Active Self, Pharmacy Records  amLODipine (NORVASC) 10 MG tablet [id number] Yes Take 10 mg by mouth daily. [provider] Taking Active Self, Pharmacy Records           Med Note Latoya Walsh, [name]   Thu Jan 01, 2022 10:10 AM)    blood glucose meter kit and supplies [id number] Yes Dispense based on patient and insurance preference. Use to monitor glucose twice daily. [name], [name], NP Taking Active Self, Pharmacy Records  blood glucose meter kit and supplies KIT [id number] Yes Dispense based on patient and insurance preference. Use up to four times daily as directed. Blue, [name], PA-C Taking  Active   Cholecalciferol (VITAMIN D3) 25 MCG (1000 UT) CAPS [id number] Yes Take 1 capsule by mouth daily. [provider] Taking Active Self, Pharmacy Records            Med Note Latoya Walsh, Latoya Walsh Jan 01, 2022 10:11 AM)    COMFORT EZ PEN NEEDLES 32G X 4 MM MISC [id number] Yes USE AS DIRECTED with insulin injections [name], NP Taking Active Self, Pharmacy Records  insulin detemir (LEVEMIR FLEXTOUCH) 100 UNIT/ML FlexPen [id number] No INJECT 35 UNITS into THE SKIN AT BEDTIME  Patient not taking: Reported on 01/22/2022   [name], [name], NP Not Taking Active Self, Pharmacy Records  insulin glargine (LANTUS) 100 UNIT/ML Solostar Pen [id number] Yes Inject 35 Units into the skin at bedtime. [name], [name], NP Taking Active Self, Pharmacy Records  Insulin Pen Needle (PEN NEEDLES) 31G X 5 MM MISC [id number] Yes Use to inject insulin once daily [name], NP Taking Active Self, Pharmacy Records  isosorbide mononitrate (IMDUR) 30 MG 24 hr tablet [id number] Yes TAKE ONE TABLET BY MOUTH EVERY MORNING Branch, [name], MD Taking Active Self, Pharmacy Records  metoprolol succinate (TOPROL-XL) 50 MG 24 hr tablet [id number] Yes TAKE ONE TABLET BY MOUTH EVERY [name], MD Taking Active Self, Pharmacy Records  mirtazapine (REMERON) 15 MG tablet [id number] Yes Take 1 tablet (15 mg total) by mouth at bedtime. [name], [name], NP Taking Active Self, Pharmacy Records  rosuvastatin (CRESTOR) 10 MG tablet [id number] Yes TAKE ONE TABLET BY MOUTH EVERY MORNING Branch, [name], MD Taking Active Self, Pharmacy Records  sevelamer carbonate (RENVELA) 800 MG tablet [id number] Yes Take 800 mg by mouth 3 (three) times daily with meals. [provider] Taking Active Self, Pharmacy Records           Med Note Latoya Walsh, Latoya Walsh Jan 01, 2022 10:12 AM)    Med List Note Latoya Walsh, Latoya Walsh 73/53/29 9242):              Patient Active Problem List   Diagnosis Date Noted   ESRD (end stage renal disease) ([place]) 01/06/2021   CHF (congestive heart failure) ([place]) 06/06/2020   Leg swelling 04/28/2020   Hyperlipidemia LDL goal <100  04/28/2020   Left hand pain 03/10/2020   Anemia 02/19/2020   COVID-19 virus infection 12/26/2019   Depression, major, single episode, in partial remission ([place]) 01/25/2019   Headache 11/13/2018   Insomnia 09/25/2018   Screening for colorectal cancer 07/23/2016   Lumbar back pain with radiculopathy affecting right lower extremity 03/27/2015   Non compliance with medical treatment 07/22/2014   Chronic hepatitis C without hepatic coma ([place]) 12/04/2013   Allergic rhinitis 11/03/2012   Anxiety and depression 02/05/2011   Nicotine dependence 11/01/2010   Dermatitis 10/30/2010   Drug dependence, continuous abuse ([place]) 03/22/2010   Uncontrolled type 2 diabetes mellitus with hyperglycemia ([place]) 06/02/2007   Alcohol abuse 06/02/2007    Conditions to be addressed/monitored per PCP order:  CHF and DMII  Care Plan : RN Care Manager Plan of Care  Updates made by Phoebe Montane, RN since 01/29/2022 12:00 AM     Problem: Health Management needs related to DMII and HF      Long-Range Goal: Development of Plan of Care to address Health Management needs related to DMII and HF   Start Date: 07/10/2021  Expected End Date: 03/23/2022  Priority: High  Note:   Current Barriers:  Chronic  Disease Management support and education needs related to CHF and DMII Ms. Lisby was very cheerful this morning. She reports attending Dialysis yesterday. She now has a working glucometer and is checking BS twice daily. She is taking lantus at night. She feels her balance is off, she has not been using DME for mobility, but has a walker in storage.   RNCM Clinical Goal(s):  Patient will verbalize understanding of plan for management of CHF and DMII as evidenced by patient verbalization of self monitoring activities take all medications exactly as prescribed and will call provider for medication related questions as evidenced by documentation in EMR    attend all scheduled medical appointments: Dialysis on M/W/F,  02/03/22 with GYN and 03/19/22 with TFC as evidenced by provider documentation          Interventions: Inter-disciplinary care team collaboration (see longitudinal plan of care) Evaluation of current treatment plan related to  self management and patient's adherence to plan as established by provider Advised patient to have someone get her walker out of storage and start using it   Heart Failure Interventions:  (Status: Goal on Track (progressing): YES.)  Long Term Goal  Wt Readings from Last 3 Encounters:  01/15/22 156 lb 3.2 oz (70.9 kg)  01/01/22 154 lb (69.9 kg)  11/05/21 143 lb 15.4 oz (65.3 kg)   Reviewed Heart Failure Action Plan in depth and provided written copy Discussed the importance of keeping all appointments with provider Assessed social determinant of health barriers Advised patient to report any concerns or changes to provider Encouraged light exercise as tolerated  Diabetes:  (Status: Goal on Track (progressing): YES.) Long Term Goal -Checking BS twice daily, reading this morning 223, yesterday 195  Lab Results  Component Value Date   HGBA1C 13.3 (H) 01/08/2022   @ Assessed patient's understanding of A1c goal: <8% Provided education to patient about basic DM disease process; Counseled on importance of regular laboratory monitoring as prescribed;        Discussed plans with patient for ongoing care management follow up and provided patient with direct contact information for care management team;      Provided patient with written educational materials related to hypo and hyperglycemia and importance of correct treatment;       Reviewed scheduled/upcoming provider appointments including: 02/03/22 with GYN and 03/19/22 with TFC;         Review of patient status, including review of consultants reports, relevant laboratory and other test results, and medications completed;       Provided encouragement to patient for checking her BS and taking insulin as  prescribed   Patient Goals/Self-Care Activities: Take medications as prescribed   Attend all scheduled provider appointments Call provider office for new concerns or questions  use salt in moderation eat more whole grains, fruits and vegetables, lean meats and healthy fats dress right for the weather, hot or cold Complete requested lab work       Follow Up:  Patient agrees to Care Plan and Follow-up.  Plan: The Managed Medicaid care management team will reach out to the patient again over the next 30 days.  Date/time of next scheduled RN care management/care coordination outreach:  03/03/22 @ [place] RN, [place] RN Care Coordinator

## 2022-01-29 NOTE — Patient Instructions (Signed)
Visit Information  Latoya Walsh was given information about Medicaid Managed Care team care coordination services as a part of their [place] Medicaid benefit. Latoya Walsh verbally consented to engagement with the [facility] Managed Care team.   If you are experiencing a medical emergency, please call 911 or report to your local emergency department or urgent care.   If you have a non-emergency medical problem during routine business hours, please contact your provider's office and ask to speak with a nurse.   For questions related to your [facility], please call: [phone] or visit the homepage here: [email or web address]  If you would like to schedule transportation through your [facility], please call the following number at least 2 days in advance of your appointment: [phone]   Rides for urgent appointments can also be made after hours by calling Member Services.  Call the [place] at [phone], at any time, 24 hours a day, 7 days a week. If you are in danger or need immediate medical attention call 911.  If you would like help to quit smoking, call 1-800-QUIT-NOW 520-617-0611) OR Espaol: 1-855-Djelo-Ya (3-220-254-2706) o para ms informacin haga clic aqu or Text READY to 200-400 to register via text  Latoya Walsh,    Please see education materials related to HF provided as print materials.   The patient verbalized understanding of instructions, educational materials, and care plan provided today and agreed to receive a mailed copy of patient instructions, educational materials, and care plan.   Telephone follow up appointment with Managed Medicaid care management team member scheduled for:03/03/22 @ [place] RN, BSN [place] RN Care Coordinator   Following is a copy of your  plan of care:  Care Plan : RN Care Manager Plan of Care  Updates made by Latoya Montane, RN since 01/29/2022 12:00 AM     Problem: Health Management needs related to DMII and HF      Long-Range Goal: Development of Plan of Care to address Health Management needs related to DMII and HF   Start Date: 07/10/2021  Expected End Date: 03/23/2022  Priority: High  Note:   Current Barriers:  Chronic Disease Management support and education needs related to CHF and DMII RNCM unable to reach Latoya Walsh on her line, call made to her daughter who was with Latoya Walsh. RNCM spoke to Latoya Walsh who reports she needs to get a new glucometer. Call made to pharmacy, patient's daughter became involved and found patient's glucometer, which does need batteries. Insulin has not been refilled since May 2023. During this call RNCM computer froze and lost both calls to pharmacy and Latoya Walsh. RNCM was able to rereach Latoya Walsh daughter.   RNCM Clinical Goal(s):  Patient will verbalize understanding of plan for management of CHF and DMII as evidenced by patient verbalization of self monitoring activities take all medications exactly as prescribed and will call provider for medication related questions as evidenced by documentation in EMR    attend all scheduled medical appointments: Dialysis on M/W/F,  rescheduling missed appointment with PCP, and 02/03/22 with GYN as evidenced by provider documentation          Interventions: Inter-disciplinary care team collaboration (see longitudinal plan of care) Evaluation of current treatment plan related to  self management and patient's adherence to plan as established by provider   Heart Failure Interventions:  (Status: Goal on Track (progressing):  YES.)  Long Term Goal  Wt Readings from Last 3 Encounters:  01/15/22 156 lb 3.2 oz (70.9 kg)  01/01/22 154 lb (69.9 kg)  11/05/21 143 lb 15.4 oz (65.3 kg)   Reviewed Heart Failure Action Plan in depth and provided written  copy Discussed the importance of keeping all appointments with provider Assessed social determinant of health barriers  Diabetes:  (Status: Goal on Track (progressing): YES.) Long Term Goal -Checking BS twice daily, reading this morning 223, yesterday 195  Lab Results  Component Value Date   HGBA1C 13.3 (H) 01/08/2022   @ Assessed patient's understanding of A1c goal: <8% Provided education to patient about basic DM disease process; Counseled on importance of regular laboratory monitoring as prescribed;        Discussed plans with patient for ongoing care management follow up and provided patient with direct contact information for care management team;      Provided patient with written educational materials related to hypo and hyperglycemia and importance of correct treatment;       Reviewed scheduled/upcoming provider appointments including: 02/03/22 with GYN;         Review of patient status, including review of consultants reports, relevant laboratory and other test results, and medications completed;         Patient Goals/Self-Care Activities: Take medications as prescribed   Attend all scheduled provider appointments Call provider office for new concerns or questions  use salt in moderation eat more whole grains, fruits and vegetables, lean meats and healthy fats dress right for the weather, hot or cold Complete requested lab work

## 2022-01-31 DIAGNOSIS — N179 Acute kidney failure, unspecified: Secondary | ICD-10-CM | POA: Diagnosis not present

## 2022-02-01 DIAGNOSIS — N179 Acute kidney failure, unspecified: Secondary | ICD-10-CM | POA: Diagnosis not present

## 2022-02-03 ENCOUNTER — Ambulatory Visit: Payer: Medicaid Other | Admitting: Adult Health

## 2022-02-05 ENCOUNTER — Other Ambulatory Visit: Payer: Self-pay

## 2022-02-05 DIAGNOSIS — N186 End stage renal disease: Secondary | ICD-10-CM

## 2022-02-07 DIAGNOSIS — N179 Acute kidney failure, unspecified: Secondary | ICD-10-CM | POA: Diagnosis not present

## 2022-02-08 DIAGNOSIS — N179 Acute kidney failure, unspecified: Secondary | ICD-10-CM | POA: Diagnosis not present

## 2022-02-10 ENCOUNTER — Telehealth (HOSPITAL_COMMUNITY): Payer: Self-pay | Admitting: *Deleted

## 2022-02-10 NOTE — Telephone Encounter (Signed)
Received fax from Dr Marval Regal for new catheter placement due to poor TDC.  Spoke with Kieth Brightly at [facility] call her with surgery date so she can coordinate with case worker or patient will no-show. Pt saw TFE August 2023. Will give to [facility] to schedule.

## 2022-02-11 NOTE — Telephone Encounter (Signed)
Attempted to schedule patient for [facility] placement. Left vm for Penny at [facility] to return call.

## 2022-02-14 DIAGNOSIS — N179 Acute kidney failure, unspecified: Secondary | ICD-10-CM | POA: Diagnosis not present

## 2022-02-15 DIAGNOSIS — N179 Acute kidney failure, unspecified: Secondary | ICD-10-CM | POA: Diagnosis not present

## 2022-02-16 NOTE — Telephone Encounter (Signed)
Left message for Latoya Walsh at [facility] to return call.   Spoke with patient regarding TDC removal. Patient unable to make appointment arrangements and unaware of case manager contact information. Advised patient, I have left vm for [facility].

## 2022-02-17 ENCOUNTER — Other Ambulatory Visit (HOSPITAL_COMMUNITY): Payer: Medicaid Other

## 2022-02-17 ENCOUNTER — Ambulatory Visit: Payer: Medicaid Other | Admitting: Vascular Surgery

## 2022-02-17 ENCOUNTER — Encounter (HOSPITAL_COMMUNITY): Payer: Medicaid Other

## 2022-02-17 ENCOUNTER — Other Ambulatory Visit: Payer: Self-pay

## 2022-02-17 DIAGNOSIS — N186 End stage renal disease: Secondary | ICD-10-CM

## 2022-02-17 NOTE — Telephone Encounter (Signed)
Spoke with [place] at [facility]. Patient scheduled for [facility] exchange with Dr. Donnetta Hutching at [facility] on 02/24/22. Instructions provided. Instructions letter will be faxed to dialysis center. Voiced understanding.

## 2022-02-19 ENCOUNTER — Other Ambulatory Visit: Payer: Self-pay

## 2022-02-19 ENCOUNTER — Encounter (HOSPITAL_COMMUNITY)
Admission: RE | Admit: 2022-02-19 | Discharge: 2022-02-19 | Disposition: A | Discharge status: Home / Self Care | Payer: Medicaid Other | Source: Ambulatory Visit | Attending: Vascular Surgery | Admitting: Vascular Surgery

## 2022-02-19 ENCOUNTER — Encounter (HOSPITAL_COMMUNITY): Payer: Self-pay

## 2022-02-19 VITALS — Ht 66.0 in | Wt 156.3 lb

## 2022-02-19 DIAGNOSIS — Z992 Dependence on renal dialysis: Secondary | ICD-10-CM | POA: Diagnosis not present

## 2022-02-19 DIAGNOSIS — N186 End stage renal disease: Secondary | ICD-10-CM

## 2022-02-19 DIAGNOSIS — E1122 Type 2 diabetes mellitus with diabetic chronic kidney disease: Secondary | ICD-10-CM | POA: Diagnosis not present

## 2022-02-21 DIAGNOSIS — N179 Acute kidney failure, unspecified: Secondary | ICD-10-CM | POA: Diagnosis not present

## 2022-02-22 DIAGNOSIS — N179 Acute kidney failure, unspecified: Secondary | ICD-10-CM | POA: Diagnosis not present

## 2022-02-23 ENCOUNTER — Encounter: Payer: Self-pay | Admitting: Family

## 2022-02-23 ENCOUNTER — Telehealth: Payer: Self-pay

## 2022-02-23 NOTE — Telephone Encounter (Signed)
[name], [name], NP came to me in clinical intake with a form that she completed for personal care services. Dinah stated that the patient needed to be changed 25 dollars for form completion and then form to be faxed.  I placed a handwritten note on the form and placed it in the to be faxed bin for the administrative staff to further process.

## 2022-02-23 NOTE — Progress Notes (Signed)
Personal care service [facility]) attestation of medical need form completed.Form given to chare to fax as requested.

## 2022-02-24 ENCOUNTER — Ambulatory Visit (HOSPITAL_COMMUNITY)
Admission: RE | Admit: 2022-02-24 | Discharge: 2022-02-24 | Disposition: A | Discharge status: Home / Self Care | Payer: Medicaid Other | Source: Home / Self Care | Attending: Vascular Surgery | Admitting: Vascular Surgery

## 2022-02-24 ENCOUNTER — Other Ambulatory Visit: Payer: Self-pay

## 2022-02-24 ENCOUNTER — Ambulatory Visit (HOSPITAL_COMMUNITY): Payer: Medicaid Other | Admitting: Anesthesiology

## 2022-02-24 ENCOUNTER — Telehealth: Payer: Self-pay

## 2022-02-24 ENCOUNTER — Ambulatory Visit (HOSPITAL_COMMUNITY): Payer: Medicaid Other

## 2022-02-24 ENCOUNTER — Ambulatory Visit (HOSPITAL_COMMUNITY)
Admission: RE | Admit: 2022-02-24 | Discharge: 2022-02-24 | Disposition: A | Discharge status: Home / Self Care | Payer: Medicaid Other | Attending: Vascular Surgery | Admitting: Vascular Surgery

## 2022-02-24 ENCOUNTER — Ambulatory Visit (HOSPITAL_BASED_OUTPATIENT_CLINIC_OR_DEPARTMENT_OTHER): Payer: Medicaid Other | Admitting: Anesthesiology

## 2022-02-24 ENCOUNTER — Encounter (HOSPITAL_COMMUNITY)
Admission: RE | Disposition: A | Discharge status: Home / Self Care | Payer: Self-pay | Source: Home / Self Care | Attending: Vascular Surgery

## 2022-02-24 DIAGNOSIS — I132 Hypertensive heart and chronic kidney disease with heart failure and with stage 5 chronic kidney disease, or end stage renal disease: Secondary | ICD-10-CM | POA: Diagnosis not present

## 2022-02-24 DIAGNOSIS — N186 End stage renal disease: Secondary | ICD-10-CM

## 2022-02-24 DIAGNOSIS — Y831 Surgical operation with implant of artificial internal device as the cause of abnormal reaction of the patient, or of later complication, without mention of misadventure at the time of the procedure: Secondary | ICD-10-CM | POA: Diagnosis not present

## 2022-02-24 DIAGNOSIS — Z992 Dependence on renal dialysis: Secondary | ICD-10-CM

## 2022-02-24 DIAGNOSIS — T8249XA Other complication of vascular dialysis catheter, initial encounter: Secondary | ICD-10-CM | POA: Diagnosis not present

## 2022-02-24 DIAGNOSIS — T82898A Other specified complication of vascular prosthetic devices, implants and grafts, initial encounter: Secondary | ICD-10-CM

## 2022-02-24 DIAGNOSIS — I509 Heart failure, unspecified: Secondary | ICD-10-CM | POA: Insufficient documentation

## 2022-02-24 DIAGNOSIS — Z794 Long term (current) use of insulin: Secondary | ICD-10-CM | POA: Diagnosis not present

## 2022-02-24 DIAGNOSIS — E1122 Type 2 diabetes mellitus with diabetic chronic kidney disease: Secondary | ICD-10-CM | POA: Insufficient documentation

## 2022-02-24 DIAGNOSIS — Z7984 Long term (current) use of oral hypoglycemic drugs: Secondary | ICD-10-CM | POA: Diagnosis not present

## 2022-02-24 DIAGNOSIS — Z87891 Personal history of nicotine dependence: Secondary | ICD-10-CM | POA: Insufficient documentation

## 2022-02-24 DIAGNOSIS — N185 Chronic kidney disease, stage 5: Secondary | ICD-10-CM

## 2022-02-24 DIAGNOSIS — Z452 Encounter for adjustment and management of vascular access device: Secondary | ICD-10-CM | POA: Diagnosis not present

## 2022-02-24 DIAGNOSIS — T8241XA Breakdown (mechanical) of vascular dialysis catheter, initial encounter: Secondary | ICD-10-CM | POA: Diagnosis not present

## 2022-02-24 HISTORY — PX: EXCHANGE OF A DIALYSIS CATHETER: SHX5818

## 2022-02-24 LAB — GLUCOSE, CAPILLARY
Glucose-Capillary: 346 mg/dL — ABNORMAL HIGH (ref 70–99)
Glucose-Capillary: 377 mg/dL — ABNORMAL HIGH (ref 70–99)

## 2022-02-24 LAB — BASIC METABOLIC PANEL
Anion gap: 16 — ABNORMAL HIGH (ref 5–15)
BUN: 34 mg/dL — ABNORMAL HIGH (ref 6–20)
CO2: 25 mmol/L (ref 22–32)
Calcium: 9.4 mg/dL (ref 8.9–10.3)
Chloride: 95 mmol/L — ABNORMAL LOW (ref 98–111)
Creatinine, Ser: 5.76 mg/dL — ABNORMAL HIGH (ref 0.44–1.00)
GFR, Estimated: 8 mL/min — ABNORMAL LOW (ref >60)
Glucose, Bld: 369 mg/dL — ABNORMAL HIGH (ref 70–99)
Potassium: 4.8 mmol/L (ref 3.5–5.1)
Sodium: 136 mmol/L (ref 135–145)

## 2022-02-24 LAB — HEMOGLOBIN AND HEMATOCRIT, BLOOD
HCT: 37.3 % (ref 36.0–46.0)
Hemoglobin: 10.9 g/dL — ABNORMAL LOW (ref 12.0–15.0)

## 2022-02-24 SURGERY — EXCHANGE OF A DIALYSIS CATHETER
Anesthesia: General | Site: Chest | Laterality: Right

## 2022-02-24 MED ORDER — CHLORHEXIDINE GLUCONATE 0.12 % MT SOLN
OROMUCOSAL | Status: AC
  Filled 2022-02-24: qty 15

## 2022-02-24 MED ORDER — HEPARIN SODIUM (PORCINE) 1000 UNIT/ML IJ SOLN
INTRAMUSCULAR | Status: AC
  Filled 2022-02-24: qty 10

## 2022-02-24 MED ORDER — VANCOMYCIN HCL IN DEXTROSE 1-5 GM/200ML-% IV SOLN
1000.0000 mg | INTRAVENOUS | Status: AC
  Administered 2022-02-24 (×2): 1000 mg via INTRAVENOUS

## 2022-02-24 MED ORDER — PROPOFOL 500 MG/50ML IV EMUL
INTRAVENOUS | Status: DC | PRN
  Administered 2022-02-24: 50 ug/kg/min via INTRAVENOUS

## 2022-02-24 MED ORDER — FENTANYL CITRATE (PF) 100 MCG/2ML IJ SOLN
INTRAMUSCULAR | Status: AC
  Filled 2022-02-24: qty 2

## 2022-02-24 MED ORDER — PHENYLEPHRINE HCL (PRESSORS) 10 MG/ML IV SOLN
INTRAVENOUS | Status: DC | PRN
  Administered 2022-02-24 (×2): 80 ug via INTRAVENOUS

## 2022-02-24 MED ORDER — LIDOCAINE-EPINEPHRINE 0.5 %-1:200000 IJ SOLN
INTRAMUSCULAR | Status: DC | PRN
  Administered 2022-02-24: 20 mL

## 2022-02-24 MED ORDER — ORAL CARE MOUTH RINSE: 15.0000 mL | Freq: Once | OROMUCOSAL | Status: AC

## 2022-02-24 MED ORDER — ONDANSETRON HCL 4 MG/2ML IJ SOLN
INTRAMUSCULAR | Status: AC
  Filled 2022-02-24: qty 2

## 2022-02-24 MED ORDER — LIDOCAINE-EPINEPHRINE 0.5 %-1:200000 IJ SOLN
INTRAMUSCULAR | Status: AC
  Filled 2022-02-24: qty 1

## 2022-02-24 MED ORDER — VANCOMYCIN HCL IN DEXTROSE 1-5 GM/200ML-% IV SOLN
INTRAVENOUS | Status: AC
  Filled 2022-02-24: qty 200

## 2022-02-24 MED ORDER — MIDAZOLAM HCL 2 MG/2ML IJ SOLN
INTRAMUSCULAR | Status: AC
  Filled 2022-02-24: qty 2

## 2022-02-24 MED ORDER — CHLORHEXIDINE GLUCONATE 0.12 % MT SOLN
15.0000 mL | Freq: Once | OROMUCOSAL | Status: AC
  Administered 2022-02-24: 15 mL via OROMUCOSAL

## 2022-02-24 MED ORDER — CHLORHEXIDINE GLUCONATE 4 % EX LIQD: 60.0000 mL | Freq: Once | CUTANEOUS | Status: DC

## 2022-02-24 MED ORDER — HEPARIN SODIUM (PORCINE) 1000 UNIT/ML IJ SOLN
INTRAMUSCULAR | Status: DC | PRN
  Administered 2022-02-24: 3800 [IU]

## 2022-02-24 MED ORDER — MIDAZOLAM HCL 5 MG/5ML IJ SOLN
INTRAMUSCULAR | Status: DC | PRN
  Administered 2022-02-24: 1 mg via INTRAVENOUS

## 2022-02-24 MED ORDER — PHENYLEPHRINE 80 MCG/ML (10ML) SYRINGE FOR IV PUSH (FOR BLOOD PRESSURE SUPPORT)
PREFILLED_SYRINGE | INTRAVENOUS | Status: AC
  Filled 2022-02-24: qty 10

## 2022-02-24 MED ORDER — LACTATED RINGERS IV SOLN: INTRAVENOUS | Status: DC | PRN

## 2022-02-24 MED ORDER — FENTANYL CITRATE (PF) 100 MCG/2ML IJ SOLN
INTRAMUSCULAR | Status: DC | PRN
  Administered 2022-02-24 (×2): 25 ug via INTRAVENOUS

## 2022-02-24 MED ORDER — HEPARIN 6000 UNIT IRRIGATION SOLUTION
Status: DC | PRN
  Administered 2022-02-24: 1

## 2022-02-24 MED ORDER — SODIUM CHLORIDE 0.9 % IV SOLN: INTRAVENOUS | Status: DC

## 2022-02-24 MED ORDER — ONDANSETRON HCL 4 MG/2ML IJ SOLN
INTRAMUSCULAR | Status: DC | PRN
  Administered 2022-02-24: 4 mg via INTRAVENOUS

## 2022-02-24 MED ORDER — OXYCODONE-ACETAMINOPHEN 5-325 MG PO TABS
ORAL_TABLET | ORAL | Status: AC
  Filled 2022-02-24: qty 1

## 2022-02-24 MED ORDER — HYDROMORPHONE HCL 1 MG/ML IJ SOLN: 0.2500 mg | INTRAMUSCULAR | Status: DC | PRN

## 2022-02-24 MED ORDER — ONDANSETRON HCL 4 MG/2ML IJ SOLN: 4.0000 mg | Freq: Once | INTRAMUSCULAR | Status: DC | PRN

## 2022-02-24 MED ORDER — OXYCODONE-ACETAMINOPHEN 5-325 MG PO TABS
1.0000 | ORAL_TABLET | Freq: Once | ORAL | Status: AC
  Administered 2022-02-24: 1 via ORAL

## 2022-02-24 SURGICAL SUPPLY — 37 items
ADH SKN CLS APL DERMABOND .7 (GAUZE/BANDAGES/DRESSINGS) ×1
BAG DECANTER FOR FLEXI CONT (MISCELLANEOUS) ×2 IMPLANT
BAG HAMPER (MISCELLANEOUS) ×2 IMPLANT
BIOPATCH RED 1 DISK 7.0 (GAUZE/BANDAGES/DRESSINGS) ×2 IMPLANT
CATH PALINDROME-P 23CM W/VT (CATHETERS) IMPLANT
COVER LIGHT HANDLE STERIS (MISCELLANEOUS) ×4 IMPLANT
COVER PROBE W GEL 5X96 (DRAPES) IMPLANT
DECANTER SPIKE VIAL GLASS SM (MISCELLANEOUS) ×2 IMPLANT
DERMABOND ADVANCED .7 DNX12 (GAUZE/BANDAGES/DRESSINGS) IMPLANT
DRAPE C-ARM FOLDED MOBILE STRL (DRAPES) ×2 IMPLANT
DRAPE CHEST BREAST 15X10 FENES (DRAPES) ×2 IMPLANT
GAUZE SPONGE 4X4 12PLY STRL (GAUZE/BANDAGES/DRESSINGS) ×2 IMPLANT
GAUZE SPONGE 4X4 16PLY XRAY LF (GAUZE/BANDAGES/DRESSINGS) ×2 IMPLANT
GLOVE BIOGEL PI IND STRL 7.0 (GLOVE) ×4 IMPLANT
GLOVE SURG MICRO LTX SZ7.5 (GLOVE) ×2 IMPLANT
GOWN STRL REUS W/TWL LRG LVL3 (GOWN DISPOSABLE) ×4 IMPLANT
KIT TURNOVER KIT A (KITS) ×2 IMPLANT
MANIFOLD NEPTUNE II (INSTRUMENTS) ×2 IMPLANT
NDL 18GX1X1/2 (RX/OR ONLY) (NEEDLE) ×2 IMPLANT
NDL HYPO 25GX1X1/2 BEV (NEEDLE) ×2 IMPLANT
NEEDLE 18GX1X1/2 (RX/OR ONLY) (NEEDLE) ×1 IMPLANT
NEEDLE 22X1 1/2 (OR ONLY) (NEEDLE) ×2 IMPLANT
NEEDLE HYPO 25GX1X1/2 BEV (NEEDLE) ×1 IMPLANT
NS IRRIG 1000ML POUR BTL (IV SOLUTION) ×2 IMPLANT
PACK SURGICAL SETUP 50X90 (CUSTOM PROCEDURE TRAY) ×2 IMPLANT
PAD ARMBOARD 7.5X6 YLW CONV (MISCELLANEOUS) ×4 IMPLANT
POSITIONER HEAD DONUT 9IN (MISCELLANEOUS) ×2 IMPLANT
SET BASIN LINEN APH (SET/KITS/TRAYS/PACK) ×2 IMPLANT
SOAP 2 % CHG 4 OZ (WOUND CARE) ×2 IMPLANT
SUT ETHILON 3 0 PS 1 (SUTURE) ×2 IMPLANT
SUT VICRYL 4-0 PS2 18IN ABS (SUTURE) ×2 IMPLANT
SYR 10ML LL (SYRINGE) ×2 IMPLANT
SYR 20ML LL LF (SYRINGE) ×2 IMPLANT
SYR 5ML LL (SYRINGE) ×4 IMPLANT
SYR CONTROL 10ML LL (SYRINGE) ×2 IMPLANT
TOWEL OR 17X26 4PK STRL BLUE (TOWEL DISPOSABLE) IMPLANT
WATER STERILE IRR 1000ML POUR (IV SOLUTION) ×2 IMPLANT

## 2022-02-24 NOTE — H&P (Signed)
   Vascular and Vein Specialist of [place]  Patient name: Latoya Walsh MRN: [id number] DOB: 1960/10/12 Sex: female    HPI: Latoya Walsh is a 61 y.o. female who presents today for tunneled dialysis catheter change.  She has had poor functioning of her right IJ catheter.  Current Facility-Administered Medications  Medication Dose Route Frequency Provider Last Rate Last Admin   0.9 %  sodium chloride infusion   Intravenous Continuous Battula, Rajamani C, MD       0.9 %  sodium chloride infusion   Intravenous Continuous Titania Gault, Arvilla Meres, MD       chlorhexidine (HIBICLENS) 4 % liquid 4 Application  60 mL Topical Once Nakota Ackert, Arvilla Meres, MD       And   [START ON 02/25/2022] chlorhexidine (HIBICLENS) 4 % liquid 4 Application  60 mL Topical Once Xavi Tomasik, Arvilla Meres, MD       chlorhexidine (PERIDEX) 0.12 % solution 15 mL  15 mL Mouth/Throat Once Denese Killings, MD       Or   Oral care mouth rinse  15 mL Mouth Rinse Once Battula, Rajamani C, MD       chlorhexidine (PERIDEX) 0.12 % solution            vancomycin (VANCOCIN) 1-5 GM/200ML-% IVPB            vancomycin (VANCOCIN) IVPB 1000 mg/200 mL premix  1,000 mg Intravenous 60 min Pre-Op Caedon Bond, Arvilla Meres, MD         PHYSICAL EXAM: Vitals:   02/24/22 0900 02/24/22 0901  BP: 122/77 122/77  Pulse: 79 82  Resp: (!) 21 20  SpO2: 100% 100%    GENERAL: The patient is a well-nourished female, in no acute distress. The vital signs are documented above. Right IJ catheter in place.  MEDICAL ISSUES: Poorly functioning right IJ catheter for exchange today.  I discussed with the patient that this may be replacement of her right IJ or we may place a left IJ catheter.   [name], MD FACS [facility] [phone]  Note: Portions of this report may have been transcribed using voice recognition software.  Every effort has been made to ensure accuracy; however, inadvertent  computerized transcription errors may still be present.

## 2022-02-24 NOTE — Op Note (Signed)
    OPERATIVE REPORT  DATE OF SURGERY: 02/24/2022  PATIENT: Latoya Walsh, 61 y.o. female MRN: [id number]  DOB: 10-26-1960  PRE-OPERATIVE DIAGNOSIS: End-stage renal disease with poorly functioning right IJ catheter  POST-OPERATIVE DIAGNOSIS:  Same  PROCEDURE: Replacement of right IJ catheter with new 23 cm tunneled hemodialysis catheter  SURGEON:  Curt Jews, M.D.  PHYSICIAN ASSISTANT: Nurse  The assistant was needed for exposure and to expedite the case  ANESTHESIA: Local with sedation  EBL: per anesthesia record  No intake/output data recorded.  BLOOD ADMINISTERED: none  DRAINS: none  SPECIMEN: none  COUNTS CORRECT:  YES  PATIENT DISPOSITION:  PACU - hemodynamically stable  PROCEDURE DETAILS: The patient was taken the operating placed supine position where the area of the right and left neck and chest prepped draped you sterile fashion.  The most recent chest x-ray from August 2023 revealed the catheter high in the atrium.  Using local anesthesia, incision was made over the IJ entry site and this was opened with an 11 blade and the catheter was grasped at this position.  The distal portion of the catheter was transected and the guidewire was passed down the catheter and the placement was confirmed with fluoroscopy.  The existing catheter was removed.  A dilator and peel-away sheath was passed over the guidewire and a 23 cm tunneled catheter was positioned in the level of the distal right atrium.  The catheter been brought through a separate stab incision made with local anesthesia on the entry clavicular area.  Both lumens flushed and aspirated easily and were locked with 1000 unit/cc heparin.  The catheter was secured to the skin with a 3-0 nylon stitch and the entry site was closed with a 4-0 subcuticular Vicryl stitch.  Attention was then turned to the exit site of the existing catheter.  This was anesthetized and the cuff was mobilized through this torsion site.  The  remaining portion of the old catheter was removed in its entirety.  The sterile dressing was applied and the patient was transferred to the recovery room with chest x-ray pending   [name], M.D., [facility] 02/24/2022 10:41 AM  Note: Portions of this report may have been transcribed using voice recognition software.  Every effort has been made to ensure accuracy; however, inadvertent computerized transcription errors may still be present.

## 2022-02-24 NOTE — Transfer of Care (Signed)
Immediate Anesthesia Transfer of Care Note  Patient: Latoya Walsh  Procedure(s) Performed: EXCHANGE OF A DIALYSIS CATHETER (Right: Chest)  Patient Location: PACU  Anesthesia Type:MAC  Level of Consciousness: awake, alert , oriented, and patient cooperative  Airway & Oxygen Therapy: Patient Spontanous Breathing and Patient connected to nasal cannula oxygen  Post-op Assessment: Report given to RN and Post -op Vital signs reviewed and stable  Post vital signs: Reviewed and stable  Last Vitals:  Vitals Value Taken Time  BP    Temp    Pulse    Resp    SpO2      Last Pain:  Vitals:   02/24/22 0856  PainSc: 0-No pain         Complications: No notable events documented.

## 2022-02-24 NOTE — Telephone Encounter (Signed)
Pt called stating that she was just d/c'd and needs some pain medication d/t severe pain.  Paged Dr. Donnetta Hutching who performed the surgery. Dr. Donnetta Hutching called stating that he was not prescribing any pain medications. She will have to use Tylenol.  Called pt, two identifiers used. Informed pt that I spoke with Dr. Donnetta Hutching and he was not going to prescribe anything. Instructed her to rest and take Tylenol as needed. Confirmed understanding.

## 2022-02-24 NOTE — Anesthesia Preprocedure Evaluation (Addendum)
Anesthesia Evaluation  Patient identified by MRN, date of birth, ID band Patient awake    Reviewed: Allergy & Precautions, H&P , NPO status , Patient's Chart, lab work & pertinent test results, reviewed documented beta blocker date and time   Airway Mallampati: II  TM Distance: >3 FB Neck ROM: Full    Dental  (+) Dental Advisory Given, Loose, Poor Dentition,    Pulmonary Current Smoker and Patient abstained from smoking.   Pulmonary exam normal breath sounds clear to auscultation       Cardiovascular Exercise Tolerance: Poor hypertension, Pt. on medications and Pt. on home beta blockers +CHF  Normal cardiovascular exam Rhythm:Regular Rate:Normal  1. Left ventricular ejection fraction, by estimation, is 55 to 60%. The left ventricle has normal function. The left ventricle has no regional  wall motion abnormalities. There is mild asymmetric left ventricular hypertrophy of the septal segment. Left  ventricular diastolic parameters are consistent with Grade I diastolic dysfunction (impaired relaxation).  2. Right ventricular systolic function is normal. The right ventricular size is normal. There is normal pulmonary artery systolic pressure. The  estimated right ventricular systolic pressure is 06.2 mmHg.  3. A small pericardial effusion is present. The pericardial effusion is posterior to the left ventricle and anterior to the right ventricle.  4. The mitral valve is grossly normal. Trivial mitral valve  regurgitation.  5. The inferior vena cava is normal in size with greater than 50% respiratory variability, suggesting right atrial pressure of 3 mmHg.   Comparison(s): Prior images reviewed side by side. LVEF has improved in  comparison.    Neuro/Psych  Headaches PSYCHIATRIC DISORDERS Anxiety Depression     Neuromuscular disease    GI/Hepatic negative GI ROS,,,(+)     substance abuse  alcohol use, Hepatitis -, C  Endo/Other   diabetes, Well Controlled, Type 2, Oral Hypoglycemic Agents, Insulin Dependent    Renal/GU ESRF and DialysisRenal disease  negative genitourinary   Musculoskeletal negative musculoskeletal ROS (+)  Chronic back pain   Abdominal   Peds negative pediatric ROS (+)  Hematology  (+) Blood dyscrasia, anemia   Anesthesia Other Findings   Reproductive/Obstetrics negative OB ROS                             Anesthesia Physical Anesthesia Plan  ASA: 4  Anesthesia Plan: General   Post-op Pain Management: Minimal or no pain anticipated   Induction: Intravenous  PONV Risk Score and Plan: 2 and Propofol infusion and Ondansetron  Airway Management Planned: Nasal Cannula and Natural Airway  Additional Equipment:   Intra-op Plan:   Post-operative Plan:   Informed Consent: I have reviewed the patients History and Physical, chart, labs and discussed the procedure including the risks, benefits and alternatives for the proposed anesthesia with the patient or authorized representative who has indicated his/her understanding and acceptance.     Dental advisory given  Plan Discussed with: CRNA and Surgeon  Anesthesia Plan Comments: (Possible GA with airway was discussed.)        Anesthesia Quick Evaluation

## 2022-02-24 NOTE — Anesthesia Postprocedure Evaluation (Signed)
Anesthesia Post Note  Patient: OWEN PAGNOTTA  Procedure(s) Performed: EXCHANGE OF A DIALYSIS CATHETER (Right: Chest)  Patient location during evaluation: Phase II Anesthesia Type: General Level of consciousness: awake and alert and oriented Pain management: pain level controlled Vital Signs Assessment: post-procedure vital signs reviewed and stable Respiratory status: spontaneous breathing, nonlabored ventilation and respiratory function stable Cardiovascular status: blood pressure returned to baseline and stable Postop Assessment: no apparent nausea or vomiting Anesthetic complications: no  No notable events documented.   Last Vitals:  Vitals:   02/24/22 1145 02/24/22 1158  BP: 126/72 132/72  Pulse: 67 64  Resp: 14 16  Temp:    SpO2: 100% 98%    Last Pain:  Vitals:   02/24/22 1158  PainSc: 10-Worst pain ever                 Mckaylah Bettendorf C Davyn Morandi

## 2022-02-28 DIAGNOSIS — N179 Acute kidney failure, unspecified: Secondary | ICD-10-CM | POA: Diagnosis not present

## 2022-03-01 DIAGNOSIS — N179 Acute kidney failure, unspecified: Secondary | ICD-10-CM | POA: Diagnosis not present

## 2022-03-02 ENCOUNTER — Encounter (HOSPITAL_COMMUNITY): Payer: Self-pay | Admitting: Vascular Surgery

## 2022-03-03 ENCOUNTER — Other Ambulatory Visit: Payer: Medicaid Other | Admitting: *Deleted

## 2022-03-03 ENCOUNTER — Encounter: Payer: Self-pay | Admitting: *Deleted

## 2022-03-03 NOTE — Patient Instructions (Signed)
Visit Information  Latoya Walsh was given information about Medicaid Managed Care team care coordination services as a part of their [place] Medicaid benefit. Latoya Walsh verbally consented to engagement with the [facility] Managed Care team.   If you are experiencing a medical emergency, please call 911 or report to your local emergency department or urgent care.   If you have a non-emergency medical problem during routine business hours, please contact your provider's office and ask to speak with a nurse.   For questions related to your [facility], please call: [phone] or visit the homepage here: [email or web address]  If you would like to schedule transportation through your [facility], please call the following number at least 2 days in advance of your appointment: [phone]   Rides for urgent appointments can also be made after hours by calling Member Services.  Call the [place] at [phone], at any time, 24 hours a day, 7 days a week. If you are in danger or need immediate medical attention call 911.  If you would like help to quit smoking, call 1-800-QUIT-NOW 6467211191) OR Espaol: 1-855-Djelo-Ya (2-703-500-9381) o para ms informacin haga clic aqu or Text READY to 200-400 to register via text  Latoya Walsh,   Please see education materials related to advanced directives provided by MyChart link.  Patient verbalizes understanding of instructions and care plan provided today and agrees to view in [place]. Active MyChart status and patient understanding of how to access instructions and care plan via MyChart confirmed with patient.     Telephone follow up appointment with Managed Medicaid care management team member scheduled for:04/02/22 @ [place] RN, BSN [place] RN Care  Coordinator   Following is a copy of your plan of care:  Care Plan : RN Care Manager Plan of Care  Updates made by Nanette Montane, RN since 03/03/2022 12:00 AM     Problem: Health Management needs related to DMII and HF      Long-Range Goal: Development of Plan of Care to address Health Management needs related to DMII and HF   Start Date: 07/10/2021  Expected End Date: 03/23/2022  Priority: High  Note:   Current Barriers:  Chronic Disease Management support and education needs related to CHF and DMII Latoya Walsh is tired this morning her fasting BS is 357. She reports not sleeping good last night. She is excited about her upcoming birthday, her family is taking her to a [place] bar.   RNCM Clinical Goal(s):  Patient will verbalize understanding of plan for management of CHF and DMII as evidenced by patient verbalization of self monitoring activities take all medications exactly as prescribed and will call provider for medication related questions as evidenced by documentation in EMR    attend all scheduled medical appointments: Dialysis on M/W/F,  03/19/22 with TFC, 05/05/22 with PCP and 05/07/22 with Cardiology as evidenced by provider documentation          Interventions: Inter-disciplinary care team collaboration (see longitudinal plan of care) Evaluation of current treatment plan related to  self management and patient's adherence to plan as established by provider Reviewed upcoming dialysis schedule, advised patient to not miss any upcoming appointments due to altered holiday schedule   Heart Failure Interventions:  (Status: Goal on Track (progressing): YES.)  Long Term Goal  Wt Readings from Last 3 Encounters:  02/19/22 156 lb 4.9 oz (70.9 kg)  01/15/22 156 lb 3.2 oz (70.9 kg)  01/01/22 154 lb (69.9 kg)   Reviewed Heart Failure Action Plan in depth and provided written copy Discussed the importance of keeping all appointments with provider Assessed social determinant of health  barriers Advised patient to report any concerns or changes to provider Encouraged light exercise as tolerated  Diabetes:  (Status: Goal on Track (progressing): YES.) Long Term Goal -Checking BS twice daily, reading this morning 223, yesterday 195  Lab Results  Component Value Date   HGBA1C 13.3 (H) 01/08/2022   @ Assessed patient's understanding of A1c goal: <8% Provided education to patient about basic DM disease process; Counseled on importance of regular laboratory monitoring as prescribed;        Discussed plans with patient for ongoing care management follow up and provided patient with direct contact information for care management team;      Provided patient with written educational materials related to hypo and hyperglycemia and importance of correct treatment;       Reviewed scheduled/upcoming provider appointments including: 02/03/22 with GYN and 03/19/22 with TFC;         Review of patient status, including review of consultants reports, relevant laboratory and other test results, and medications completed;       Provided encouragement to patient for checking her BS and taking insulin as prescribed Advised patient to set an alarm to remind her to check BS and take her night time insulin Encouraged patient to drink water this morning   Patient Goals/Self-Care Activities: Take medications as prescribed   Attend all scheduled provider appointments Call provider office for new concerns or questions  use salt in moderation eat more whole grains, fruits and vegetables, lean meats and healthy fats dress right for the weather, hot or cold Complete requested lab work

## 2022-03-03 NOTE — Patient Outreach (Signed)
Medicaid Managed Care   Nurse Care Manager Note  03/03/2022 Name:  Latoya Walsh MRN:  [id number] DOB:  [birth year]  Latoya Walsh is an 61 y.o. year old female who is a primary patient of [name], [name], NP.  [facility] [address] Managed Care Coordination team was consulted for assistance with:    CHF DMII  Latoya Walsh was given information about Medicaid Managed Care Coordination team services today. [name] Patient agreed to services and verbal consent obtained.  Engaged with patient by telephone for follow up visit in response to provider referral for case management and/or care coordination services.   Assessments/Interventions:  Review of past medical history, allergies, medications, health status, including review of consultants reports, laboratory and other test data, was performed as part of comprehensive evaluation and provision of chronic care management services.  SDOH (Social Determinants of Health) assessments and interventions performed: SDOH Interventions    Flowsheet Row Patient Outreach Telephone from 03/03/2022 in [place] Patient Outreach Telephone from 08/21/2021 in [place] Coordination Patient Outreach Telephone from 07/31/2021 in [place] Patient Outreach Telephone from 07/10/2021 in [place] Patient Outreach Telephone from 06/26/2021 in [place] Patient Outreach Telephone from 02/28/2021 in [place] Interventions        Food Insecurity Interventions -- -- Other (Comment)  [Advised patient to recertify for food benefits] -- -- --  Housing Interventions Intervention Not Indicated -- -- Intervention Not Indicated  [Patient is currently living with her daughter] -- --  Transportation Interventions Intervention Not Indicated --  Other (Comment)  [Provided patient with UHC transportation [phone] -- -- --  Depression Interventions/Treatment  -- -- -- -- Medication --  [Referral to MM LCSW, [place]]  Stress Interventions -- Provide Counseling, [employer] -- -- -- --       Care Plan  Allergies  Allergen Reactions   Penicillins Itching and Rash    Medications Reviewed Today     Reviewed by Necie Montane, RN (Registered Nurse) on 03/03/22 at [phone]  Med List Status: <None>   Medication Order Taking? Sig Documenting Provider Last Dose Status Informant  ACCU-CHEK GUIDE test strip [id number] Yes Use to check glucose twice daily [name], [name], NP Taking Active Self, Pharmacy Records  albuterol (VENTOLIN HFA) 108 (90 Base) MCG/ACT inhaler [id number] No Inhale 2 puffs into the lungs every 4 (four) hours as needed for wheezing or shortness of breath. [name], DO Unknown Active Self, Pharmacy Records  amLODipine (NORVASC) 10 MG tablet [id number] No Take 10 mg by mouth daily. [provider] Unknown Active Self, Pharmacy Records           Med Note [name], [name]   Thu Jan 01, 2022 10:10 AM)    blood glucose meter kit and supplies [id number] Yes Dispense based on patient and insurance preference. Use to monitor glucose twice daily. [name], [name], NP Taking Active Self, Pharmacy Records  blood glucose meter kit and supplies KIT [id number] Yes Dispense based on patient and insurance preference. Use up to four times daily as directed. Blue, [name], PA-C Taking Active   Cholecalciferol (VITAMIN D3) 25 MCG (1000 UT) CAPS [id number] No Take 1 capsule by mouth daily. [provider] Unknown Active Self, Pharmacy Records           Med Note (DILLARD, [name]  Thu Jan 01, 2022 10:11 AM)    COMFORT EZ PEN NEEDLES 32G X 4 MM MISC [id number] Yes USE AS DIRECTED with insulin injections [name], NP Taking Active Self, Pharmacy Records  insulin detemir (LEVEMIR  FLEXTOUCH) 100 UNIT/ML FlexPen [id number] No INJECT 35 UNITS into THE SKIN AT BEDTIME  Patient not taking: Reported on 01/22/2022   [name], [name], NP Not Taking Active Self, Pharmacy Records  insulin glargine (LANTUS) 100 UNIT/ML Solostar Pen [id number] Yes Inject 35 Units into the skin at bedtime. [name], [name], NP Taking Active Self, Pharmacy Records           Med Note (Marvin Maenza A   Tue Mar 03, 2022  9:24 AM) Taking irregularly  Insulin Pen Needle (PEN NEEDLES) 31G X 5 MM MISC [id number] Yes Use to inject insulin once daily [name], NP Taking Active Self, Pharmacy Records  isosorbide mononitrate (IMDUR) 30 MG 24 hr tablet [id number] No TAKE ONE TABLET BY MOUTH EVERY MORNING Branch, [name], MD Unknown Active Self, Pharmacy Records  metoprolol succinate (TOPROL-XL) 50 MG 24 hr tablet [id number] No TAKE ONE TABLET BY MOUTH EVERY EVENING Branch, [name], MD Unknown Active Self, Pharmacy Records  mirtazapine (REMERON) 15 MG tablet [id number] No Take 1 tablet (15 mg total) by mouth at bedtime. [name], [name], NP Unknown Active Self, Pharmacy Records  rosuvastatin (CRESTOR) 10 MG tablet [id number] No TAKE ONE TABLET BY MOUTH EVERY MORNING Branch, [name], MD Unknown Active Self, Pharmacy Records  sevelamer carbonate (RENVELA) 800 MG tablet [id number] No Take 800 mg by mouth 3 (three) times daily with meals. [provider] Unknown Active Self, Pharmacy Records           Med Note [name], [name] Jan 01, 2022 10:12 AM)    Med List Note [name], [place] 63/84/66 5993):              Patient Active Problem List   Diagnosis Date Noted   ESRD (end stage renal disease) ([place]) 01/06/2021   CHF (congestive heart failure) ([place]) 06/06/2020   Leg swelling 04/28/2020   Hyperlipidemia LDL goal <100 04/28/2020   Left hand pain 03/10/2020   Anemia 02/19/2020   COVID-19 virus infection 12/26/2019   Depression, major, single episode, in partial remission ([place])  01/25/2019   Headache 11/13/2018   Insomnia 09/25/2018   Screening for colorectal cancer 07/23/2016   Lumbar back pain with radiculopathy affecting right lower extremity 03/27/2015   Non compliance with medical treatment 07/22/2014   Chronic hepatitis C without hepatic coma ([place]) 12/04/2013   Allergic rhinitis 11/03/2012   Anxiety and depression 02/05/2011   Nicotine dependence 11/01/2010   Dermatitis 10/30/2010   Drug dependence, continuous abuse ([place]) 03/22/2010   Uncontrolled type 2 diabetes mellitus with hyperglycemia ([place]) 06/02/2007   Alcohol abuse 06/02/2007    Conditions to be addressed/monitored per PCP order:  CHF and DMII  Care Plan : RN Care Manager Plan of Care  Updates made by Elizabethanne Montane, RN since 03/03/2022 12:00 AM     Problem: Health Management needs related to DMII and HF      Long-Range Goal: Development of Plan of Care to address Health Management needs related to DMII and HF   Start Date: 07/10/2021  Expected End Date: 03/23/2022  Priority: High  Note:   Current Barriers:  Chronic Disease Management support and education needs related to CHF and DMII Latoya Walsh is tired this morning her  fasting BS is 357. She reports not sleeping good last night. She is excited about her upcoming birthday, her family is taking her to a [place] bar.   RNCM Clinical Goal(s):  Patient will verbalize understanding of plan for management of CHF and DMII as evidenced by patient verbalization of self monitoring activities take all medications exactly as prescribed and will call provider for medication related questions as evidenced by documentation in EMR    attend all scheduled medical appointments: Dialysis on M/W/F,  03/19/22 with TFC, 05/05/22 with PCP and 05/07/22 with Cardiology as evidenced by provider documentation          Interventions: Inter-disciplinary care team collaboration (see longitudinal plan of care) Evaluation of current treatment plan related to  self  management and patient's adherence to plan as established by provider Reviewed upcoming dialysis schedule, advised patient to not miss any upcoming appointments due to altered holiday schedule   Heart Failure Interventions:  (Status: Goal on Track (progressing): YES.)  Long Term Goal  Wt Readings from Last 3 Encounters:  02/19/22 156 lb 4.9 oz (70.9 kg)  01/15/22 156 lb 3.2 oz (70.9 kg)  01/01/22 154 lb (69.9 kg)   Reviewed Heart Failure Action Plan in depth and provided written copy Discussed the importance of keeping all appointments with provider Assessed social determinant of health barriers Advised patient to report any concerns or changes to provider Encouraged light exercise as tolerated  Diabetes:  (Status: Goal on Track (progressing): YES.) Long Term Goal -Checking BS twice daily, reading this morning 223, yesterday 195  Lab Results  Component Value Date   HGBA1C 13.3 (H) 01/08/2022   @ Assessed patient's understanding of A1c goal: <8% Provided education to patient about basic DM disease process; Counseled on importance of regular laboratory monitoring as prescribed;        Discussed plans with patient for ongoing care management follow up and provided patient with direct contact information for care management team;      Provided patient with written educational materials related to hypo and hyperglycemia and importance of correct treatment;       Reviewed scheduled/upcoming provider appointments including: 02/03/22 with GYN and 03/19/22 with TFC;         Review of patient status, including review of consultants reports, relevant laboratory and other test results, and medications completed;       Provided encouragement to patient for checking her BS and taking insulin as prescribed Advised patient to set an alarm to remind her to check BS and take her night time insulin Encouraged patient to drink water this morning   Patient Goals/Self-Care Activities: Take medications  as prescribed   Attend all scheduled provider appointments Call provider office for new concerns or questions  use salt in moderation eat more whole grains, fruits and vegetables, lean meats and healthy fats dress right for the weather, hot or cold Complete requested lab work       Follow Up:  Patient agrees to Care Plan and Follow-up.  Plan: The Managed Medicaid care management team will reach out to the patient again over the next 30 days.  Date/time of next scheduled RN care management/care coordination outreach:  04/02/22 @ [place] RN, [place] RN Care Coordinator

## 2022-03-07 DIAGNOSIS — N179 Acute kidney failure, unspecified: Secondary | ICD-10-CM | POA: Diagnosis not present

## 2022-03-08 DIAGNOSIS — N179 Acute kidney failure, unspecified: Secondary | ICD-10-CM | POA: Diagnosis not present

## 2022-03-10 ENCOUNTER — Other Ambulatory Visit: Payer: Self-pay | Admitting: Family

## 2022-03-10 DIAGNOSIS — R630 Anorexia: Secondary | ICD-10-CM

## 2022-03-14 DIAGNOSIS — N179 Acute kidney failure, unspecified: Secondary | ICD-10-CM | POA: Diagnosis not present

## 2022-03-15 DIAGNOSIS — N179 Acute kidney failure, unspecified: Secondary | ICD-10-CM | POA: Diagnosis not present

## 2022-03-17 ENCOUNTER — Encounter: Payer: Medicaid Other | Admitting: Family

## 2022-03-19 ENCOUNTER — Ambulatory Visit (INDEPENDENT_AMBULATORY_CARE_PROVIDER_SITE_OTHER): Payer: Medicaid Other | Admitting: Podiatry

## 2022-03-19 DIAGNOSIS — Z91199 Patient's noncompliance with other medical treatment and regimen due to unspecified reason: Secondary | ICD-10-CM

## 2022-03-19 NOTE — Progress Notes (Signed)
Pt was a no show for apt, no charge 

## 2022-03-21 DIAGNOSIS — N179 Acute kidney failure, unspecified: Secondary | ICD-10-CM | POA: Diagnosis not present

## 2022-03-22 DIAGNOSIS — E1122 Type 2 diabetes mellitus with diabetic chronic kidney disease: Secondary | ICD-10-CM | POA: Diagnosis not present

## 2022-03-22 DIAGNOSIS — N179 Acute kidney failure, unspecified: Secondary | ICD-10-CM | POA: Diagnosis not present

## 2022-03-22 DIAGNOSIS — Z992 Dependence on renal dialysis: Secondary | ICD-10-CM | POA: Diagnosis not present

## 2022-03-22 DIAGNOSIS — N186 End stage renal disease: Secondary | ICD-10-CM | POA: Diagnosis not present

## 2022-03-23 NOTE — Progress Notes (Signed)
  This encounter was created in error - please disregard. No show 

## 2022-03-28 DIAGNOSIS — N179 Acute kidney failure, unspecified: Secondary | ICD-10-CM | POA: Diagnosis not present

## 2022-03-29 DIAGNOSIS — N179 Acute kidney failure, unspecified: Secondary | ICD-10-CM | POA: Diagnosis not present

## 2022-04-02 ENCOUNTER — Other Ambulatory Visit: Payer: Medicaid Other | Admitting: *Deleted

## 2022-04-02 NOTE — Patient Outreach (Signed)
Medicaid Managed Care   Nurse Care Manager Note  04/02/2022 Name:  Latoya Walsh MRN:  [id number] DOB:  September 07, 1960  Latoya Walsh is an 62 y.o. year old female who is a primary patient of [name], [name], NP.  The [facility] Managed Care Coordination team was consulted for assistance with:    CHF DMII  [name] was given information about Medicaid Managed Care Coordination team services today. Latoya Walsh Patient agreed to services and verbal consent obtained.  Engaged with patient by telephone for follow up visit in response to provider referral for case management and/or care coordination services.   Assessments/Interventions:  Review of past medical history, allergies, medications, health status, including review of consultants reports, laboratory and other test data, was performed as part of comprehensive evaluation and provision of chronic care management services.  SDOH (Social Determinants of Health) assessments and interventions performed: SDOH Interventions    Flowsheet Row Patient Outreach Telephone from 03/03/2022 in [place] Patient Outreach Telephone from 08/21/2021 in [place] Coordination Patient Outreach Telephone from 07/31/2021 in [place] Patient Outreach Telephone from 07/10/2021 in [place] Patient Outreach Telephone from 06/26/2021 in [place] Patient Outreach Telephone from 02/28/2021 in [place] Interventions        Food Insecurity Interventions -- -- Other (Comment)  [Advised patient to recertify for food benefits] -- -- --  Housing Interventions Intervention Not Indicated -- -- Intervention Not Indicated  [Patient is currently living with her daughter] -- --  Transportation Interventions Intervention Not Indicated -- Other  (Comment)  [Provided patient with UHC transportation [phone] -- -- --  Depression Interventions/Treatment  -- -- -- -- Medication --  [Referral to MM LCSW, [place]]  Stress Interventions -- Provide Counseling, [employer] -- -- -- --       Care Plan  Allergies  Allergen Reactions   Penicillins Itching and Rash    Medications Reviewed Today     Reviewed by [name], RN (Registered Nurse) on 03/03/22 at [phone]  Med List Status: <None>   Medication Order Taking? Sig Documenting Provider Last Dose Status Informant  ACCU-CHEK GUIDE test strip [id number] Yes Use to check glucose twice daily [name], [name], NP Taking Active Self, Pharmacy Records  albuterol (VENTOLIN HFA) 108 (90 Base) MCG/ACT inhaler [id number] No Inhale 2 puffs into the lungs every 4 (four) hours as needed for wheezing or shortness of breath. [name], DO Unknown Active Self, Pharmacy Records  amLODipine (NORVASC) 10 MG tablet [id number] No Take 10 mg by mouth daily. [provider] Unknown Active Self, Pharmacy Records           Med Note Latoya Walsh, Latoya E   Thu Jan 01, 2022 10:10 AM)    blood glucose meter kit and supplies [id number] Yes Dispense based on patient and insurance preference. Use to monitor glucose twice daily. [name], [name], NP Taking Active Self, Pharmacy Records  blood glucose meter kit and supplies KIT [id number] Yes Dispense based on patient and insurance preference. Use up to four times daily as directed. Walsh, Latoya A, PA-C Taking Active   Cholecalciferol (VITAMIN D3) 25 MCG (1000 UT) CAPS [id number] No Take 1 capsule by mouth daily. [provider] Unknown Active Self, Pharmacy Records           Med Note (Walsh, Latoya E  Thu Jan 01, 2022 10:11 AM)    COMFORT EZ PEN NEEDLES 32G X 4 MM MISC [id number] Yes USE AS DIRECTED with insulin injections [name], NP Taking Active Self, Pharmacy Records  insulin detemir (LEVEMIR  FLEXTOUCH) 100 UNIT/ML FlexPen [id number] No INJECT 35 UNITS into THE SKIN AT BEDTIME  Patient not taking: Reported on 01/22/2022   [name], [name], NP Not Taking Active Self, Pharmacy Records  insulin glargine (LANTUS) 100 UNIT/ML Solostar Pen [id number] Yes Inject 35 Units into the skin at bedtime. [name], [name], NP Taking Active Self, Pharmacy Records           Med Note ([name]   Tue Mar 03, 2022  9:24 AM) Taking irregularly  Insulin Pen Needle (PEN NEEDLES) 31G X 5 MM MISC [id number] Yes Use to inject insulin once daily [name], NP Taking Active Self, Pharmacy Records  isosorbide mononitrate (IMDUR) 30 MG 24 hr tablet [id number] No TAKE ONE TABLET BY MOUTH EVERY MORNING Branch, Latoya Guild, MD Unknown Active Self, Pharmacy Records  metoprolol succinate (TOPROL-XL) 50 MG 24 hr tablet [id number] No TAKE ONE TABLET BY MOUTH EVERY EVENING Branch, Latoya Guild, MD Unknown Active Self, Pharmacy Records  mirtazapine (REMERON) 15 MG tablet [id number] No Take 1 tablet (15 mg total) by mouth at bedtime. [name], [name], NP Unknown Active Self, Pharmacy Records  rosuvastatin (CRESTOR) 10 MG tablet [id number] No TAKE ONE TABLET BY MOUTH EVERY MORNING Branch, Latoya Guild, MD Unknown Active Self, Pharmacy Records  sevelamer carbonate (RENVELA) 800 MG tablet [id number] No Take 800 mg by mouth 3 (three) times daily with meals. [provider] Unknown Active Self, Pharmacy Records           Med Note Latoya Walsh, Latoya Walsh Jan 01, 2022 10:12 AM)    Med List Note Latoya Walsh, [place] 46/50/35 4656):              Patient Active Problem List   Diagnosis Date Noted   ESRD (end stage renal disease) ([place]) 01/06/2021   CHF (congestive heart failure) ([place]) 06/06/2020   Leg swelling 04/28/2020   Hyperlipidemia LDL goal <100 04/28/2020   Left hand pain 03/10/2020   Anemia 02/19/2020   COVID-19 virus infection 12/26/2019   Depression, major, single episode, in partial remission ([place])  01/25/2019   Headache 11/13/2018   Insomnia 09/25/2018   Screening for colorectal cancer 07/23/2016   Lumbar back pain with radiculopathy affecting right lower extremity 03/27/2015   Non compliance with medical treatment 07/22/2014   Chronic hepatitis C without hepatic coma ([place]) 12/04/2013   Allergic rhinitis 11/03/2012   Anxiety and depression 02/05/2011   Nicotine dependence 11/01/2010   Dermatitis 10/30/2010   Drug dependence, continuous abuse ([place]) 03/22/2010   Uncontrolled type 2 diabetes mellitus with hyperglycemia ([place]) 06/02/2007   Alcohol abuse 06/02/2007    Conditions to be addressed/monitored per PCP order:  CHF and DMII  Care Plan : RN Care Manager Plan of Care  Updates made by Josseline Montane, RN since 04/02/2022 12:00 AM     Problem: Health Management needs related to DMII and HF      Long-Range Goal: Development of Plan of Care to address Health Management needs related to DMII and HF   Start Date: 07/10/2021  Expected End Date: 06/19/2022  Priority: High  Note:   Current Barriers:  Chronic Disease Management support and education needs related to CHF and DMII Ms. Ortlieb is experiencing diarrhea this morning,  reports having a virus. She was unable to attend dialysis yesterday, plans to attend tomorrow. Her daughter is at home and helping to care for [name].  RNCM Clinical Goal(s):  Patient will verbalize understanding of plan for management of CHF and DMII as evidenced by patient verbalization of self monitoring activities take all medications exactly as prescribed and will call provider for medication related questions as evidenced by documentation in EMR    attend all scheduled medical appointments: Dialysis on M/W/F,  03/19/22 with [facility], 05/05/22 with PCP and 05/07/22 with Cardiology as evidenced by provider documentation          Interventions: Inter-disciplinary care team collaboration (see longitudinal plan of care) Evaluation of current treatment plan  related to  self management and patient's adherence to plan as established by provider Advised patient to continue to drink fluids, to avoid dehydration   Heart Failure Interventions:  (Status: Goal on Track (progressing): YES.)  Long Term Goal  Wt Readings from Last 3 Encounters:  02/19/22 156 lb 4.9 oz (70.9 kg)  01/15/22 156 lb 3.2 oz (70.9 kg)  01/01/22 154 lb (69.9 kg)   Reviewed Heart Failure Action Plan in depth and provided written copy Discussed the importance of keeping all appointments with provider Assessed social determinant of health barriers Advised patient to report any concerns or changes to provider Encouraged light exercise as tolerated  Diabetes:  (Status: Goal on Track (progressing): YES.) Long Term Goal -Checking BS twice daily, reading this morning 223, yesterday 195  Lab Results  Component Value Date   HGBA1C 13.3 (H) 01/08/2022   @ Assessed patient's understanding of A1c goal: <8% Provided education to patient about basic DM disease process; Counseled on importance of regular laboratory monitoring as prescribed;        Discussed plans with patient for ongoing care management follow up and provided patient with direct contact information for care management team;      Provided patient with written educational materials related to hypo and hyperglycemia and importance of correct treatment;       Reviewed scheduled/upcoming provider appointments including: 02/03/22 with GYN and 03/19/22 with [facility];         Review of patient status, including review of consultants reports, relevant laboratory and other test results, and medications completed;       Advised patient to continue to check her BS and take her medications as directed Education provided on diabetes and sick day managment   Patient Goals/Self-Care Activities: Take medications as prescribed   Attend all scheduled provider appointments Call provider office for new concerns or questions  use salt in  moderation eat more whole grains, fruits and vegetables, lean meats and healthy fats dress right for the weather, hot or cold Complete requested lab work       Follow Up:  Patient agrees to Care Plan and Follow-up.  Plan: The Managed Medicaid care management team will reach out to the patient again over the next 7 days.  Date/time of next scheduled RN care management/care coordination outreach:  04/07/22 @ 11:15am  [name] RN, BSN [place]  [employer] manager

## 2022-04-02 NOTE — Patient Instructions (Signed)
Visit Information  Ms. Dossantos was given information about Medicaid Managed Care team care coordination services as a part of their [place] Medicaid benefit. [name] verbally consented to engagement with the [facility] Managed Care team.   If you are experiencing a medical emergency, please call 911 or report to your local emergency department or urgent care.   If you have a non-emergency medical problem during routine business hours, please contact your provider's office and ask to speak with a nurse.   For questions related to your [facility], please call: [phone] or visit the homepage here: [email or web address]  If you would like to schedule transportation through your [facility], please call the following number at least 2 days in advance of your appointment: [phone]   Rides for urgent appointments can also be made after hours by calling Member Services.  Call the [place] at [phone], at any time, 24 hours a day, 7 days a week. If you are in danger or need immediate medical attention call 911.  If you would like help to quit smoking, call 1-800-QUIT-NOW 606-356-4743) OR Espaol: 1-855-Djelo-Ya (7-510-258-5277) o para ms informacin haga clic aqu or Text READY to 200-400 to register via text  Ms. [name],   Please see education materials related to DM and diarrhea provided by MyChart link.  Patient verbalizes understanding of instructions and care plan provided today and agrees to view in [place]. Active MyChart status and patient understanding of how to access instructions and care plan via MyChart confirmed with patient.     Telephone follow up appointment with Managed Medicaid care management team member scheduled for:04/07/22 @ 11:15am  [name] RN, BSN [place] RN Care  Coordinator   Following is a copy of your plan of care:  Care Plan : RN Care Manager Plan of Care  Updates made by Denita Montane, RN since 04/02/2022 12:00 AM     Problem: Health Management needs related to DMII and HF      Long-Range Goal: Development of Plan of Care to address Health Management needs related to DMII and HF   Start Date: 07/10/2021  Expected End Date: 06/19/2022  Priority: High  Note:   Current Barriers:  Chronic Disease Management support and education needs related to CHF and DMII Ms. Medine is experiencing diarrhea this morning, reports having a virus. She was unable to attend dialysis yesterday, plans to attend tomorrow. Her daughter is at home and helping to care for Ms. [name].  RNCM Clinical Goal(s):  Patient will verbalize understanding of plan for management of CHF and DMII as evidenced by patient verbalization of self monitoring activities take all medications exactly as prescribed and will call provider for medication related questions as evidenced by documentation in EMR    attend all scheduled medical appointments: Dialysis on M/W/F,  03/19/22 with [facility], 05/05/22 with PCP and 05/07/22 with Cardiology as evidenced by provider documentation          Interventions: Inter-disciplinary care team collaboration (see longitudinal plan of care) Evaluation of current treatment plan related to  self management and patient's adherence to plan as established by provider Advised patient to continue to drink fluids, to avoid dehydration   Heart Failure Interventions:  (Status: Goal on Track (progressing): YES.)  Long Term Goal  Wt Readings from Last 3 Encounters:  02/19/22 156 lb 4.9 oz (70.9 kg)  01/15/22 156 lb 3.2 oz (70.9  kg)  01/01/22 154 lb (69.9 kg)   Reviewed Heart Failure Action Plan in depth and provided written copy Discussed the importance of keeping all appointments with provider Assessed social determinant of health barriers Advised patient to report  any concerns or changes to provider Encouraged light exercise as tolerated  Diabetes:  (Status: Goal on Track (progressing): YES.) Long Term Goal -Checking BS twice daily, reading this morning 223, yesterday 195  Lab Results  Component Value Date   HGBA1C 13.3 (H) 01/08/2022   @ Assessed patient's understanding of A1c goal: <8% Provided education to patient about basic DM disease process; Counseled on importance of regular laboratory monitoring as prescribed;        Discussed plans with patient for ongoing care management follow up and provided patient with direct contact information for care management team;      Provided patient with written educational materials related to hypo and hyperglycemia and importance of correct treatment;       Reviewed scheduled/upcoming provider appointments including: 02/03/22 with GYN and 03/19/22 with [facility];         Review of patient status, including review of consultants reports, relevant laboratory and other test results, and medications completed;       Advised patient to continue to check her BS and take her medications as directed Education provided on diabetes and sick day managment   Patient Goals/Self-Care Activities: Take medications as prescribed   Attend all scheduled provider appointments Call provider office for new concerns or questions  use salt in moderation eat more whole grains, fruits and vegetables, lean meats and healthy fats dress right for the weather, hot or cold Complete requested lab work

## 2022-04-04 DIAGNOSIS — N179 Acute kidney failure, unspecified: Secondary | ICD-10-CM | POA: Diagnosis not present

## 2022-04-05 DIAGNOSIS — N179 Acute kidney failure, unspecified: Secondary | ICD-10-CM | POA: Diagnosis not present

## 2022-04-07 ENCOUNTER — Encounter: Payer: Self-pay | Admitting: *Deleted

## 2022-04-07 ENCOUNTER — Other Ambulatory Visit: Payer: Medicaid Other | Admitting: *Deleted

## 2022-04-07 NOTE — Patient Outreach (Signed)
Medicaid Managed Care   Nurse Care Manager Note  04/07/2022 Name:  Latoya Walsh MRN:  [id number] DOB:  1961-01-14  Latoya Walsh is an 62 y.o. year old female who is a primary patient of [name], [name], NP.  The [facility] Managed Care Coordination team was consulted for assistance with:    CHF DMII  [name] was given information about Medicaid Managed Care Coordination team services today. Latoya Walsh Patient agreed to services and verbal consent obtained.  Engaged with patient by telephone for follow up visit in response to provider referral for case management and/or care coordination services.   Assessments/Interventions:  Review of past medical history, allergies, medications, health status, including review of consultants reports, laboratory and other test data, was performed as part of comprehensive evaluation and provision of chronic care management services.  SDOH (Social Determinants of Health) assessments and interventions performed: SDOH Interventions    Flowsheet Row Patient Outreach Telephone from 03/03/2022 in [place] Patient Outreach Telephone from 08/21/2021 in [place] Coordination Patient Outreach Telephone from 07/31/2021 in [place] Patient Outreach Telephone from 07/10/2021 in [place] Patient Outreach Telephone from 06/26/2021 in [place] Patient Outreach Telephone from 02/28/2021 in [place] Interventions        Food Insecurity Interventions -- -- Other (Comment)  [Advised patient to recertify for food benefits] -- -- --  Housing Interventions Intervention Not Indicated -- -- Intervention Not Indicated  [Patient is currently living with her daughter] -- --  Transportation Interventions Intervention Not Indicated -- Other  (Comment)  [Provided patient with UHC transportation [phone] -- -- --  Depression Interventions/Treatment  -- -- -- -- Medication --  [Referral to MM LCSW, [place]]  Stress Interventions -- Provide Counseling, [employer] -- -- -- --       Care Plan  Allergies  Allergen Reactions   Penicillins Itching and Rash    Medications Reviewed Today     Reviewed by Carianna Montane, RN (Registered Nurse) on 04/07/22 at 1131  Med List Status: <None>   Medication Order Taking? Sig Documenting Provider Last Dose Status Informant  ACCU-CHEK GUIDE test strip [id number] No Use to check glucose twice daily  Patient not taking: Reported on 04/07/2022   [name], [name], NP Not Taking Active Self, Pharmacy Records  albuterol (VENTOLIN HFA) 108 (90 Base) MCG/ACT inhaler [id number] Yes Inhale 2 puffs into the lungs every 4 (four) hours as needed for wheezing or shortness of breath. [name], DO Taking Active Self, Pharmacy Records  amLODipine (NORVASC) 10 MG tablet [id number] Yes Take 10 mg by mouth daily. [provider] Taking Active Self, Pharmacy Records           Med Note [name], [name]   Thu Jan 01, 2022 10:10 AM)    blood glucose meter kit and supplies [id number] No Dispense based on patient and insurance preference. Use to monitor glucose twice daily.  Patient not taking: Reported on 04/07/2022   [name], [name], NP Not Taking Active Self, Pharmacy Records  blood glucose meter kit and supplies KIT [id number] No Dispense based on patient and insurance preference. Use up to four times daily as directed.  Patient not taking: Reported on 04/07/2022   Blue, [name], PA-C Not Taking Active   Cholecalciferol (VITAMIN D3) 25 MCG (1000 UT) CAPS [id number] No  Take 1 capsule by mouth daily.  Patient not taking: Reported on 04/07/2022   [provider] Not Taking Active Self, Pharmacy Records           Med Note [name], [name] Jan 01, 2022  10:11 AM)    COMFORT EZ PEN NEEDLES 32G X 4 MM MISC [id number] Yes USE AS DIRECTED with insulin injections [name], NP Taking Active Self, Pharmacy Records  insulin detemir (LEVEMIR FLEXTOUCH) 100 UNIT/ML FlexPen [id number] No INJECT 35 UNITS into THE SKIN AT BEDTIME  Patient not taking: Reported on 01/22/2022   [name], [name], NP Not Taking Active Self, Pharmacy Records  insulin glargine (LANTUS) 100 UNIT/ML Solostar Pen [id number] Yes Inject 35 Units into the skin at bedtime. [name], [name], NP Taking Active Self, Pharmacy Records           Med Note (Abid Bolla A   Tue Mar 03, 2022  9:24 AM) Taking irregularly  Insulin Pen Needle (PEN NEEDLES) 31G X 5 MM MISC [id number] Yes Use to inject insulin once daily [name], NP Taking Active Self, Pharmacy Records  isosorbide mononitrate (IMDUR) 30 MG 24 hr tablet [id number] Yes TAKE ONE TABLET BY MOUTH EVERY MORNING Branch, [name], MD Taking Active Self, Pharmacy Records  metoprolol succinate (TOPROL-XL) 50 MG 24 hr tablet [id number] Yes TAKE ONE TABLET BY MOUTH EVERY EVENING Branch, [name], MD Taking Active Self, Pharmacy Records  mirtazapine (REMERON) 15 MG tablet [id number] Yes TAKE ONE TABLET BY MOUTH EVERY EVENING [name], [name], NP Taking Active   rosuvastatin (CRESTOR) 10 MG tablet [id number] Yes TAKE ONE TABLET BY MOUTH EVERY MORNING Branch, [name], MD Taking Active Self, Pharmacy Records  sevelamer carbonate (RENVELA) 800 MG tablet [id number] No Take 800 mg by mouth 3 (three) times daily with meals.  Patient not taking: Reported on 04/07/2022   [provider] Not Taking Active Self, Pharmacy Records           Med Note York Pellant Jan 01, 2022 10:12 AM)    Med List Note [name], [place] [birth year] 9833):              Patient Active Problem List   Diagnosis Date Noted   ESRD (end stage renal disease) ([place]) 01/06/2021   CHF (congestive heart failure) ([place]) 06/06/2020   Leg  swelling 04/28/2020   Hyperlipidemia LDL goal <100 04/28/2020   Left hand pain 03/10/2020   Anemia 02/19/2020   COVID-19 virus infection 12/26/2019   Depression, major, single episode, in partial remission ([place]) 01/25/2019   Headache 11/13/2018   Insomnia 09/25/2018   Screening for colorectal cancer 07/23/2016   Lumbar back pain with radiculopathy affecting right lower extremity 03/27/2015   Non compliance with medical treatment 07/22/2014   Chronic hepatitis C without hepatic coma ([place]) 12/04/2013   Allergic rhinitis 11/03/2012   Anxiety and depression 02/05/2011   Nicotine dependence 11/01/2010   Dermatitis 10/30/2010   Drug dependence, continuous abuse ([place]) 03/22/2010   Uncontrolled type 2 diabetes mellitus with hyperglycemia ([place]) 06/02/2007   Alcohol abuse 06/02/2007    Conditions to be addressed/monitored per PCP order:  CHF and DMII  Care Plan : RN Care Manager Plan of Care  Updates made by Adoria Montane, RN since 04/07/2022 12:00 AM     Problem: Health Management needs related to DMII and HF      Long-Range Goal: Development of Plan of Care to address Health  Management needs related to DMII and HF   Start Date: 07/10/2021  Expected End Date: 06/19/2022  Priority: High  Note:   Current Barriers:  Chronic Disease Management support and education needs related to CHF and DMII Ms. Kimmer is feeling better and back to attending Dialysis. She has not been checking BS, but does report taking insulin and other medications. She is requesting assistance with scheduling transportation to upcoming appointments.  RNCM Clinical Goal(s):  Patient will verbalize understanding of plan for management of CHF and DMII as evidenced by patient verbalization of self monitoring activities take all medications exactly as prescribed and will call provider for medication related questions as evidenced by documentation in EMR    attend all scheduled medical appointments: Dialysis on M/W/F,  05/05/22 with PCP and 05/07/22 with Cardiology as evidenced by provider documentation          Interventions: Inter-disciplinary care team collaboration (see longitudinal plan of care) Evaluation of current treatment plan related to  self management and patient's adherence to plan as established by provider RNCM will reach out to patient and assist with scheduling transportation on 04/28/22 @ 3:30pm   Heart Failure Interventions:  (Status: Goal on Track (progressing): YES.)  Long Term Goal  Wt Readings from Last 3 Encounters:  02/19/22 156 lb 4.9 oz (70.9 kg)  01/15/22 156 lb 3.2 oz (70.9 kg)  01/01/22 154 lb (69.9 kg)   Provided education on low sodium diet Discussed the importance of keeping all appointments with provider Assessed social determinant of health barriers Advised patient to report any concerns or changes to provider Encouraged light exercise as tolerated  Diabetes:  (Status: Goal on Track (progressing): YES.) Long Term Goal -Patient is not checking BS. She does have all needed supplies Lab Results  Component Value Date   HGBA1C 13.3 (H) 01/08/2022   @ Assessed patient's understanding of A1c goal: <8% Provided education to patient about basic DM disease process; Counseled on importance of regular laboratory monitoring as prescribed;        Discussed plans with patient for ongoing care management follow up and provided patient with direct contact information for care management team;      Provided patient with written educational materials related to hypo and hyperglycemia and importance of correct treatment;       Reviewed scheduled/upcoming provider appointments including: see list above;         Review of patient status, including review of consultants reports, relevant laboratory and other test results, and medications completed;       Advised patient to continue to check her BS and take her medications as directed   Patient Goals/Self-Care Activities: Take  medications as prescribed   Attend all scheduled provider appointments Call provider office for new concerns or questions  use salt in moderation eat more whole grains, fruits and vegetables, lean meats and healthy fats dress right for the weather, hot or cold Complete requested lab work       Follow Up:  Patient agrees to Care Plan and Follow-up.  Plan: The Managed Medicaid care management team will reach out to the patient again over the next 30 days.  Date/time of next scheduled RN care management/care coordination outreach:  04/28/22 @ 3:30pm  [name] RN, BSN [place] RN Care Coordinator

## 2022-04-07 NOTE — Patient Instructions (Signed)
Visit Information  [name] was given information about Medicaid Managed Care team care coordination services as a part of their [place] Medicaid benefit. Latoya Walsh verbally consented to engagement with the [facility] Managed Care team.   If you are experiencing a medical emergency, please call 911 or report to your local emergency department or urgent care.   If you have a non-emergency medical problem during routine business hours, please contact your provider's office and ask to speak with a nurse.   For questions related to your [facility], please call: [phone] or visit the homepage here: [email or web address]  If you would like to schedule transportation through your [facility], please call the following number at least 2 days in advance of your appointment: [phone]   Rides for urgent appointments can also be made after hours by calling Member Services.  Call the [place] at [phone], at any time, 24 hours a day, 7 days a week. If you are in danger or need immediate medical attention call 911.  If you would like help to quit smoking, call 1-800-QUIT-NOW 587 606 1188) OR Espaol: 1-855-Djelo-Ya (0-932-671-2458) o para ms informacin haga clic aqu or Text READY to 200-400 to register via text  [name],   Please see education materials related to HF provided by MyChart link.  Patient verbalizes understanding of instructions and care plan provided today and agrees to view in [place]. Active MyChart status and patient understanding of how to access instructions and care plan via MyChart confirmed with patient.     Telephone follow up appointment with Managed Medicaid care management team member scheduled for:04/28/22 @ 3:30pm  Latoya Joiner RN, BSN [place] RN Care  Coordinator   Following is a copy of your plan of care:  Care Plan : RN Care Manager Plan of Care  Updates made by Latoya Montane, RN since 04/07/2022 12:00 AM     Problem: Health Management needs related to DMII and HF      Long-Range Goal: Development of Plan of Care to address Health Management needs related to DMII and HF   Start Date: 07/10/2021  Expected End Date: 06/19/2022  Priority: High  Note:   Current Barriers:  Chronic Disease Management support and education needs related to CHF and DMII Latoya Walsh is feeling better and back to attending Dialysis. She has not been checking BS, but does report taking insulin and other medications. She is requesting assistance with scheduling transportation to upcoming appointments.  RNCM Clinical Goal(s):  Patient will verbalize understanding of plan for management of CHF and DMII as evidenced by patient verbalization of self monitoring activities take all medications exactly as prescribed and will call provider for medication related questions as evidenced by documentation in EMR    attend all scheduled medical appointments: Dialysis on M/W/F, 05/05/22 with PCP and 05/07/22 with Cardiology as evidenced by provider documentation          Interventions: Inter-disciplinary care team collaboration (see longitudinal plan of care) Evaluation of current treatment plan related to  self management and patient's adherence to plan as established by provider RNCM will reach out to patient and assist with scheduling transportation on 04/28/22 @ 3:30pm   Heart Failure Interventions:  (Status: Goal on Track (progressing): YES.)  Long Term Goal  Wt Readings from Last 3 Encounters:  02/19/22 156 lb 4.9 oz (70.9 kg)  01/15/22 156 lb 3.2 oz (70.9 kg)  01/01/22 154 lb (69.9 kg)   Provided education on low sodium diet Discussed the importance of keeping all appointments with provider Assessed social determinant of health barriers Advised patient to report  any concerns or changes to provider Encouraged light exercise as tolerated  Diabetes:  (Status: Goal on Track (progressing): YES.) Long Term Goal -Patient is not checking BS. She does have all needed supplies Lab Results  Component Value Date   HGBA1C 13.3 (H) 01/08/2022   @ Assessed patient's understanding of A1c goal: <8% Provided education to patient about basic DM disease process; Counseled on importance of regular laboratory monitoring as prescribed;        Discussed plans with patient for ongoing care management follow up and provided patient with direct contact information for care management team;      Provided patient with written educational materials related to hypo and hyperglycemia and importance of correct treatment;       Reviewed scheduled/upcoming provider appointments including: see list above;         Review of patient status, including review of consultants reports, relevant laboratory and other test results, and medications completed;       Advised patient to continue to check her BS and take her medications as directed   Patient Goals/Self-Care Activities: Take medications as prescribed   Attend all scheduled provider appointments Call provider office for new concerns or questions  use salt in moderation eat more whole grains, fruits and vegetables, lean meats and healthy fats dress right for the weather, hot or cold Complete requested lab work

## 2022-04-08 ENCOUNTER — Telehealth: Payer: Self-pay | Admitting: *Deleted

## 2022-04-08 NOTE — Patient Outreach (Signed)
  Care Management   Note  04/08/2022 Name: Latoya Walsh MRN: [id number] DOB: 02/17/1961  [name] is enrolled in a Managed Medicaid plan: Yes.   RNCM received message from [facility] stating that Latoya Walsh has requested a shower chair and rollator walker for increased weakness and frequent falls. RNCM messaged PCP Marlowe Sax, NP requesting order for DME. RNCM will follow up with Latoya Walsh at next scheduled telephone appointment.  [name] RN, BSN [place]  [employer] manager

## 2022-04-08 NOTE — Telephone Encounter (Signed)
Called and spoke with patient. She stated that she has an appointment scheduled for 05/05/2022 and will discuss DME at that time.  I offered patient a sooner appointment and she declined.

## 2022-04-08 NOTE — Telephone Encounter (Signed)
[name], [name], NP  Jazline Montane, RN; Leilana Mcquire, Albertina Senegal, CMA Hello [name], Will have our staff call Ms.Hayness to schedule visit for evaluation of DME. Thank You Dinah [name],FNP-C       Previous Messages    ----- Message ----- From: Coty Montane, RN Sent: 04/08/2022   9:21 AM EST To: Sandrea Hughs, NP Subject: DME                                            Hello,  I am the HR Managed Medicaid Case Manager working with Ms. [name]. I received a message from [facility] that patient is requesting a shower chair and rollator walker due to increased weakness and multiple falls. Can you order these items? Thank you  [name] RN, BSN [place] RN Care Coordinator

## 2022-04-11 DIAGNOSIS — N179 Acute kidney failure, unspecified: Secondary | ICD-10-CM | POA: Diagnosis not present

## 2022-04-12 DIAGNOSIS — N179 Acute kidney failure, unspecified: Secondary | ICD-10-CM | POA: Diagnosis not present

## 2022-04-18 DIAGNOSIS — N179 Acute kidney failure, unspecified: Secondary | ICD-10-CM | POA: Diagnosis not present

## 2022-04-19 DIAGNOSIS — N179 Acute kidney failure, unspecified: Secondary | ICD-10-CM | POA: Diagnosis not present

## 2022-04-19 DIAGNOSIS — F321 Major depressive disorder, single episode, moderate: Secondary | ICD-10-CM | POA: Diagnosis not present

## 2022-04-22 DIAGNOSIS — E1122 Type 2 diabetes mellitus with diabetic chronic kidney disease: Secondary | ICD-10-CM | POA: Diagnosis not present

## 2022-04-22 DIAGNOSIS — Z992 Dependence on renal dialysis: Secondary | ICD-10-CM | POA: Diagnosis not present

## 2022-04-22 DIAGNOSIS — N186 End stage renal disease: Secondary | ICD-10-CM | POA: Diagnosis not present

## 2022-04-25 DIAGNOSIS — N186 End stage renal disease: Secondary | ICD-10-CM | POA: Diagnosis not present

## 2022-04-26 DIAGNOSIS — N186 End stage renal disease: Secondary | ICD-10-CM | POA: Diagnosis not present

## 2022-04-28 ENCOUNTER — Other Ambulatory Visit: Payer: Medicaid Other | Admitting: *Deleted

## 2022-04-28 ENCOUNTER — Encounter: Payer: Self-pay | Admitting: *Deleted

## 2022-04-28 NOTE — Patient Instructions (Signed)
Visit Information  [name] was given information about Medicaid Managed Care team care coordination services as a part of their [place] Medicaid benefit. Latoya Walsh verbally consented to engagement with the [facility] Managed Care team.   If you are experiencing a medical emergency, please call 911 or report to your local emergency department or urgent care.   If you have a non-emergency medical problem during routine business hours, please contact your provider's office and ask to speak with a nurse.   For questions related to your [facility], please call: [phone] or visit the homepage here: [email or web address]  If you would like to schedule transportation through your [facility], please call the following number at least 2 days in advance of your appointment: [phone]   Rides for urgent appointments can also be made after hours by calling Member Services.  Call the [place] at [phone], at any time, 24 hours a day, 7 days a week. If you are in danger or need immediate medical attention call 911.  If you would like help to quit smoking, call 1-800-QUIT-NOW 4027354948) OR Espaol: 1-855-Djelo-Ya (5-573-220-2542) o para ms informacin haga clic aqu or Text READY to 200-400 to register via text  [name],   Please see education materials related to diabetes provided as print materials.   The patient verbalized understanding of instructions, educational materials, and care plan provided today and agreed to receive a mailed copy of patient instructions, educational materials, and care plan.   Telephone follow up appointment with Managed Medicaid care management team member scheduled for:05/12/22 @ 11:15am  Latoya Joiner RN, BSN [place] RN Care Coordinator   Following is a copy of  your plan of care:  Care Plan : RN Care Manager Plan of Care  Updates made by Latoya Montane, RN since 04/28/2022 12:00 AM     Problem: Health Management needs related to DMII and HF      Long-Range Goal: Development of Plan of Care to address Health Management needs related to DMII and HF   Start Date: 07/10/2021  Expected End Date: 06/19/2022  Priority: High  Note:   Current Barriers:  Chronic Disease Management support and education needs related to CHF and DMII Latoya Walsh is excited today. She has moved to [place], living in an apartment with a roommate. She reports having all of her medications, which come compliance packaged. She does miss taking insulin at times, due to being tired or forgetting.  RNCM Clinical Goal(s):  Patient will verbalize understanding of plan for management of CHF and DMII as evidenced by patient verbalization of self monitoring activities take all medications exactly as prescribed and will call provider for medication related questions as evidenced by documentation in EMR    attend all scheduled medical appointments: Dialysis on M/W/F, 05/05/22 with PCP and 05/07/22 with Cardiology as evidenced by provider documentation          Interventions: Inter-disciplinary care team collaboration (see longitudinal plan of care) Evaluation of current treatment plan related to  self management and patient's adherence to plan as established by provider [facility] collaborated with patient's sister, Latoya Walsh for updated address, EMR updated RNCM provided upcoming appointments to [facility], she will assist with arranging transportation to upcoming appointments RNCM advised patient/Latoya Walsh to contact [employer], [employer] and [employer] to update new address Advised patient to discuss DME needs with PCP   Heart Failure Interventions:  (Status: Condition  stable. Not addressed this visit.)  Long Term Goal  Wt Readings from Last 3 Encounters:  02/19/22 156 lb 4.9 oz (70.9 kg)  01/15/22 156 lb 3.2 oz (70.9  kg)  01/01/22 154 lb (69.9 kg)   Provided education on low sodium diet Discussed the importance of keeping all appointments with provider Assessed social determinant of health barriers Advised patient to report any concerns or changes to provider Encouraged light exercise as tolerated  Diabetes:  (Status: Goal on track: NO.) Long Term Goal -Patient is not checking BS, reports missing not taking insulin regularly Lab Results  Component Value Date   HGBA1C 13.3 (H) 01/08/2022   @ Assessed patient's understanding of A1c goal: <8% Provided education to patient about basic DM disease process; Reviewed prescribed diet with patient diabetic; Counseled on importance of regular laboratory monitoring as prescribed;        Discussed plans with patient for ongoing care management follow up and provided patient with direct contact information for care management team;      Reviewed scheduled/upcoming provider appointments including: Dialysis on M/W/F, 05/05/22 with PCP and 05/07/22 with Cardiology;         Review of patient status, including review of consultants reports, relevant laboratory and other test results, and medications completed;       Assessed social determinant of health barriers;        Advised patient to check her BS and take her medications as directed   Patient Goals/Self-Care Activities: Take medications as prescribed   Attend all scheduled provider appointments Call provider office for new concerns or questions  use salt in moderation eat more whole grains, fruits and vegetables, lean meats and healthy fats dress right for the weather, hot or cold Complete requested lab work

## 2022-04-28 NOTE — Patient Outreach (Signed)
Medicaid Managed Care   Nurse Care Manager Note  04/28/2022 Name:  Latoya Walsh MRN:  [id number] DOB:  [birth year]  Latoya Walsh is an 62 y.o. year old female who is a primary patient of Latoya Walsh, Latoya C, NP.  The [facility] Managed Care Coordination team was consulted for assistance with:    DMII  Latoya Walsh was given information about Medicaid Managed Care Coordination team services today. Latoya Walsh Patient agreed to services and verbal consent obtained.  Engaged with patient by telephone for follow up visit in response to provider referral for case management and/or care coordination services.   Assessments/Interventions:  Review of past medical history, allergies, medications, health status, including review of consultants reports, laboratory and other test data, was performed as part of comprehensive evaluation and provision of chronic care management services.  SDOH (Social Determinants of Health) assessments and interventions performed: SDOH Interventions    Flowsheet Row Patient Outreach Telephone from 04/28/2022 in [place] Patient Outreach Telephone from 03/03/2022 in [place] Patient Outreach Telephone from 08/21/2021 in [place] Coordination Patient Outreach Telephone from 07/31/2021 in [place] Patient Outreach Telephone from 07/10/2021 in [place] Patient Outreach Telephone from 06/26/2021 in [place] Interventions        Food Insecurity Interventions Intervention Not Indicated -- -- Other (Comment)  [Advised patient to recertify for food benefits] -- --  Housing Interventions Intervention Not Indicated Intervention Not Indicated -- -- Intervention Not Indicated  [Patient is currently living with her daughter] --  Transportation Interventions Payor  Benefit Intervention Not Indicated -- Other (Comment)  [Provided patient with [employer] transportation [phone] -- --  Utilities Interventions Intervention Not Indicated -- -- -- -- --  Depression Interventions/Treatment  -- -- -- -- -- Medication  Stress Interventions -- -- Provide Counseling, [place]  Allergies  Allergen Reactions   Penicillins Itching and Rash    Medications Reviewed Today     Reviewed by Latoya Montane, RN (Registered Nurse) on 04/28/22 at [place] List Status: <None>   Medication Order Taking? Sig Documenting Provider Last Dose Status Informant  ACCU-CHEK GUIDE test strip [id number]  Use to check glucose twice daily  Patient not taking: Reported on 04/07/2022   Latoya Walsh, Latoya C, NP  Active Self, Pharmacy Records  albuterol (VENTOLIN HFA) 108 (90 Base) MCG/ACT inhaler [id number]  Inhale 2 puffs into the lungs every 4 (four) hours as needed for wheezing or shortness of breath. [name], DO  Active Self, Pharmacy Records  amLODipine (NORVASC) 10 MG tablet [id number]  Take 10 mg by mouth daily. [provider]  Active Self, Pharmacy Records           Med Note [name], [name]   Thu Jan 01, 2022 10:10 AM)    blood glucose meter kit and supplies [id number]  Dispense based on patient and insurance preference. Use to monitor glucose twice daily.  Patient not taking: Reported on 04/07/2022   Latoya Walsh, [name], NP  Active Self, Pharmacy Records  blood glucose meter kit and supplies KIT [id number]  Dispense based on patient and insurance preference. Use up to four times daily as directed.  Patient not taking: Reported on 04/07/2022   Blue, [name], PA-Walsh  Active   Cholecalciferol (VITAMIN D3) 25 MCG (  1000 UT) CAPS [id number]  Take 1 capsule by mouth daily.  Patient not taking: Reported on 04/07/2022   [provider]  Active Self, Pharmacy Records           Med Note [name], [name] Jan 01, 2022 10:11 AM)    COMFORT EZ PEN NEEDLES 32G X 4 MM MISC [id number]  USE AS DIRECTED with insulin injections [name], NP  Active Self, Pharmacy Records  insulin detemir (LEVEMIR FLEXTOUCH) 100 UNIT/ML FlexPen [id number]  INJECT 35 UNITS into THE SKIN AT BEDTIME  Patient not taking: Reported on 01/22/2022   Latoya Walsh, [name], NP  Active Self, Pharmacy Records  insulin glargine (LANTUS) 100 UNIT/ML Solostar Pen [id number]  Inject 35 Units into the skin at bedtime. Latoya Walsh, [name], NP  Active Self, Pharmacy Records           Med Note ([name] A   Tue Mar 03, 2022  9:24 AM) Taking irregularly  Insulin Pen Needle (PEN NEEDLES) 31G X 5 MM MISC [id number]  Use to inject insulin once daily [name], NP  Active Self, Pharmacy Records  isosorbide mononitrate (IMDUR) 30 MG 24 hr tablet [id number]  TAKE ONE TABLET BY MOUTH EVERY MORNING Branch, [name], MD  Active Self, Pharmacy Records  metoprolol succinate (TOPROL-XL) 50 MG 24 hr tablet [id number]  TAKE ONE TABLET BY MOUTH EVERY EVENING Branch, [name], MD  Active Self, Pharmacy Records  mirtazapine (REMERON) 15 MG tablet [id number]  TAKE ONE TABLET BY MOUTH EVERY EVENING Latoya Walsh, Latoya C, NP  Active   rosuvastatin (CRESTOR) 10 MG tablet [id number]  TAKE ONE TABLET BY MOUTH EVERY MORNING Branch, [name], MD  Active Self, Pharmacy Records  sevelamer carbonate (RENVELA) 800 MG tablet [id number]  Take 800 mg by mouth 3 (three) times daily with meals.  Patient not taking: Reported on 04/07/2022   [provider]  Active Self, Pharmacy Records           Med Note [name], [name] Jan 01, 2022 10:12 AM)    Med List Note [name], [place] 93/81/82 9937):              Patient Active Problem List   Diagnosis Date Noted   ESRD (end stage renal disease) ([place]) 01/06/2021   CHF (congestive heart failure) ([place]) 06/06/2020   Leg swelling 04/28/2020   Hyperlipidemia LDL goal <100 04/28/2020   Left hand pain  03/10/2020   Anemia 02/19/2020   COVID-19 virus infection 12/26/2019   Depression, major, single episode, in partial remission ([place]) 01/25/2019   Headache 11/13/2018   Insomnia 09/25/2018   Screening for colorectal cancer 07/23/2016   Lumbar back pain with radiculopathy affecting right lower extremity 03/27/2015   Non compliance with medical treatment 07/22/2014   Chronic hepatitis Walsh without hepatic coma ([place]) 12/04/2013   Allergic rhinitis 11/03/2012   Anxiety and depression 02/05/2011   Nicotine dependence 11/01/2010   Dermatitis 10/30/2010   Drug dependence, continuous abuse ([place]) 03/22/2010   Uncontrolled type 2 diabetes mellitus with hyperglycemia ([place]) 06/02/2007   Alcohol abuse 06/02/2007    Conditions to be addressed/monitored per PCP order:  DMII  Care Plan : [place] of Care  Updates made by Stephanine Montane, RN since 04/28/2022 12:00 AM     Problem: Health Management needs related to DMII and HF      Long-Range Goal: Development of Plan of Care to address Health  Management needs related to DMII and HF   Start Date: 07/10/2021  Expected End Date: 06/19/2022  Priority: High  Note:   Current Barriers:  Chronic Disease Management support and education needs related to CHF and DMII Ms. Piggott is excited today. She has moved to [place], living in an apartment with a roommate. She reports having all of her medications, which come compliance packaged. She does miss taking insulin at times, due to being tired or forgetting.  RNCM Clinical Goal(s):  Patient will verbalize understanding of plan for management of CHF and DMII as evidenced by patient verbalization of self monitoring activities take all medications exactly as prescribed and will call provider for medication related questions as evidenced by documentation in EMR    attend all scheduled medical appointments: Dialysis on M/W/F, 05/05/22 with PCP and 05/07/22 with Cardiology as evidenced by provider  documentation          Interventions: Inter-disciplinary care team collaboration (see longitudinal plan of care) Evaluation of current treatment plan related to  self management and patient's adherence to plan as established by provider [facility] collaborated with patient's sister, Freida Busman for updated address, EMR updated RNCM provided upcoming appointments to [facility], she will assist with arranging transportation to upcoming appointments RNCM advised patient/Candy to contact [employer], [employer] and [employer] to update new address Advised patient to discuss DME needs with PCP   Heart Failure Interventions:  (Status: Condition stable. Not addressed this visit.)  Long Term Goal  Wt Readings from Last 3 Encounters:  02/19/22 156 lb 4.9 oz (70.9 kg)  01/15/22 156 lb 3.2 oz (70.9 kg)  01/01/22 154 lb (69.9 kg)   Provided education on low sodium diet Discussed the importance of keeping all appointments with provider Assessed social determinant of health barriers Advised patient to report any concerns or changes to provider Encouraged light exercise as tolerated  Diabetes:  (Status: Goal on track: NO.) Long Term Goal -Patient is not checking BS, reports missing not taking insulin regularly Lab Results  Component Value Date   HGBA1C 13.3 (H) 01/08/2022   @ Assessed patient's understanding of A1c goal: <8% Provided education to patient about basic DM disease process; Reviewed prescribed diet with patient diabetic; Counseled on importance of regular laboratory monitoring as prescribed;        Discussed plans with patient for ongoing care management follow up and provided patient with direct contact information for care management team;      Reviewed scheduled/upcoming provider appointments including: Dialysis on M/W/F, 05/05/22 with PCP and 05/07/22 with Cardiology;         Review of patient status, including review of consultants reports, relevant laboratory and other test results, and medications completed;        Assessed social determinant of health barriers;        Advised patient to check her BS and take her medications as directed   Patient Goals/Self-Care Activities: Take medications as prescribed   Attend all scheduled provider appointments Call provider office for new concerns or questions  use salt in moderation eat more whole grains, fruits and vegetables, lean meats and healthy fats dress right for the weather, hot or cold Complete requested lab work       Follow Up:  Patient agrees to Care Plan and Follow-up.  Plan: The Managed Medicaid care management team will reach out to the patient again over the next 15 days.  Date/time of next scheduled RN care management/care coordination outreach:  05/12/22 @ 11:15am  [name] RN,  [facility] RN Care Coordinator

## 2022-04-30 LAB — HEPATIC FUNCTION PANEL: Alkaline Phosphatase: 96 (ref 25–125)

## 2022-04-30 LAB — BASIC METABOLIC PANEL
BUN: 36 — AB (ref 4–21)
Chloride: 99 (ref 99–108)
Creatinine: 7.8 — AB (ref 0.5–1.1)
Glucose: 480
Potassium: 4.8 mEq/L (ref 3.5–5.1)
Sodium: 135 — AB (ref 137–147)

## 2022-04-30 LAB — IRON,TIBC AND FERRITIN PANEL
Ferritin: 158
Iron: 84
TIBC: 273
UIBC: 189

## 2022-04-30 LAB — COMPREHENSIVE METABOLIC PANEL: Calcium: 9.4 (ref 8.7–10.7)

## 2022-04-30 LAB — CBC AND DIFFERENTIAL
HCT: 41 (ref 36–46)
Hemoglobin: 12.8 (ref 12.0–16.0)
Platelets: 142 10*3/uL — AB (ref 150–400)
WBC: 6.3

## 2022-04-30 LAB — TSH: TSH: 1.53 (ref 0.41–5.90)

## 2022-04-30 LAB — CBC: RBC: 4.75 (ref 3.87–5.11)

## 2022-05-02 DIAGNOSIS — N186 End stage renal disease: Secondary | ICD-10-CM | POA: Diagnosis not present

## 2022-05-03 DIAGNOSIS — N186 End stage renal disease: Secondary | ICD-10-CM | POA: Diagnosis not present

## 2022-05-05 ENCOUNTER — Ambulatory Visit (INDEPENDENT_AMBULATORY_CARE_PROVIDER_SITE_OTHER): Payer: Medicaid Other | Admitting: Family

## 2022-05-05 ENCOUNTER — Encounter: Payer: Self-pay | Admitting: Family

## 2022-05-05 VITALS — BP 100/62 | HR 85 | Temp 97.8°F | Resp 18 | Ht 66.0 in | Wt 156.0 lb

## 2022-05-05 DIAGNOSIS — Z1211 Encounter for screening for malignant neoplasm of colon: Secondary | ICD-10-CM | POA: Diagnosis not present

## 2022-05-05 DIAGNOSIS — Z1231 Encounter for screening mammogram for malignant neoplasm of breast: Secondary | ICD-10-CM

## 2022-05-05 DIAGNOSIS — F32A Depression, unspecified: Secondary | ICD-10-CM | POA: Diagnosis not present

## 2022-05-05 DIAGNOSIS — F419 Anxiety disorder, unspecified: Secondary | ICD-10-CM

## 2022-05-05 DIAGNOSIS — I120 Hypertensive chronic kidney disease with stage 5 chronic kidney disease or end stage renal disease: Secondary | ICD-10-CM | POA: Diagnosis not present

## 2022-05-05 DIAGNOSIS — I5022 Chronic systolic (congestive) heart failure: Secondary | ICD-10-CM | POA: Diagnosis not present

## 2022-05-05 DIAGNOSIS — E1169 Type 2 diabetes mellitus with other specified complication: Secondary | ICD-10-CM

## 2022-05-05 DIAGNOSIS — R2681 Unsteadiness on feet: Secondary | ICD-10-CM | POA: Diagnosis not present

## 2022-05-05 DIAGNOSIS — N186 End stage renal disease: Secondary | ICD-10-CM | POA: Diagnosis not present

## 2022-05-05 DIAGNOSIS — E785 Hyperlipidemia, unspecified: Secondary | ICD-10-CM

## 2022-05-05 NOTE — Progress Notes (Signed)
Provider: Marlowe Sax FNP-C   Audreanna Torrisi, [name], NP  Patient Care Team: Carnell Beavers, [name], NP as PCP - General (Family Medicine) Harl Bowie [name], MD as PCP - Cardiology (Cardiology) Nayleen Montane, RN as Case Manager  Extended Emergency Contact Information Primary Emergency Contact: Fountain,[place] Phone: [phone] Relation: Sister Secondary Emergency Contact: Cecille Aver, [place] [address] Johnnette Litter of [place] Phone: [phone] Relation: Aunt  Code Status:  Full Code  Goals of care: Advanced Directive information    05/05/2022    8:57 AM  Advanced Directives  Does Patient Have a Medical Advance Directive? No  Would patient like information on creating a medical advance directive? No - Patient declined     Chief Complaint  Patient presents with   Medical Management of Chronic Issues    Patient is here for a 14M F/U for DM II (uncontrolled)   Quality Metric Gaps    Patient is due for colonoscopy, mammogram and annual eye exam   Immunizations    Patient is due for updated covid and Tdap, discuss need for shingrix vaccine     HPI:  Pt is a 62 y.o. female seen today for 4 months follow up medical management of chronic diseases. 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 She is here with her sister.states patient has relocated close to her and friends feels more happy. She request order for shower chair to use in the shower due to unsteady gait high risk for falls. Also DME Rolator to allow her to maintain current level of independence with ADL's which cannot be achieved with Rolling walker or cane. Patient suffers from chronic Congestive Heart failure and unsteady gait which impairs her ability to perform daily activities like bathing,walking,dressing,grooming and toileting  in the home. She due for colonoscopy.Cologuard verse colonoscopy discussed prefers colonoscopy.will refer her to GI.   Also due for shingles,COVID-19 and Tdap vaccine but states would like to wait on getting vaccine will let me know next visit if she needs them.   Agrees to have referral for mammogram. States no symptoms on the breast.     Past Medical History:  Diagnosis Date   Allergic rhinitis    Anxiety    CHF (congestive heart failure) ([place])    a. EF 30-35% by echo in 05/2020 b. EF at 45% in 09/2020   Chronic back pain    Chronic hepatitis C without hepatic coma ([place])    COVID-19 virus infection 12/26/2019   Depression    Diabetes mellitus    Hepatitis C    Hypertension    Noncompliance    Poor appetite 07/17/2014   Substance abuse ([place])    HX of drug use and alcohol use   Past Surgical History:  Procedure Laterality Date   APPENDECTOMY  2004   AV FISTULA PLACEMENT Left 11/26/2020   Procedure: LEFT ARM ARTERIOVENOUS (AV) FISTULA CREATION;  Surgeon: [name], MD;  Location: [facility];  Service: Vascular;  Laterality: Left;   AV FISTULA PLACEMENT Left 06/03/2021   Procedure: INSERTION OF LEFT UPPER ARM ARTERIOVENOUS (AV) GORE-TEX GRAFT;  Surgeon: [name], MD;  Location: [facility];  Service: Vascular;  Laterality: Left;   EXCHANGE OF A DIALYSIS CATHETER Right 02/24/2022   Procedure: EXCHANGE OF A DIALYSIS CATHETER;  Surgeon: [name], MD;  Location: [facility];  Service: Vascular;  Laterality: Right;   IR FLUORO GUIDE CV LINE RIGHT  12/25/2020   IR US GUIDE VASC ACCESS RIGHT  12/25/2020   LIGATION ARTERIOVENOUS GORTEX GRAFT Left 08/05/2021   Procedure: LIGATION OF LEFT ARM ARTERIOVENOUS GORTEX GRAFT;  Surgeon: [name], MD;  Location: [facility];  Service: Vascular;  Laterality: Left;    Allergies  Allergen Reactions   Penicillins Itching and Rash    Allergies as of 05/05/2022       Reactions   Penicillins Itching, Rash        Medication List         Accurate as of May 05, 2022 10:23 AM. If you have any questions, ask your nurse or doctor.          STOP taking these medications    Accu-Chek Guide test strip Generic drug: glucose blood Stopped by: [name] Eean Buss, NP   blood glucose meter kit and supplies Stopped by: Sandrea Hughs, NP   blood glucose meter kit and supplies Kit Stopped by: [name] Raihan Kimmel, NP   insulin glargine 100 UNIT/ML Solostar Pen Commonly known as: LANTUS Stopped by: Sandrea Hughs, NP   Levemir FlexTouch 100 UNIT/ML FlexPen Generic drug: insulin detemir Stopped by: Sandrea Hughs, NP   Vitamin D3 25 MCG (1000 UT) capsule Generic drug: Cholecalciferol Stopped by: Sandrea Hughs, NP       TAKE these medications    albuterol 108 (90 Base) MCG/ACT inhaler Commonly known as: VENTOLIN HFA Inhale 2 puffs into the lungs every 4 (four) hours as needed for wheezing or shortness of breath.   amLODipine 10 MG tablet Commonly known as: NORVASC Take 10 mg by mouth daily.   isosorbide mononitrate 30 MG 24 hr tablet Commonly known as: IMDUR TAKE ONE TABLET BY MOUTH EVERY MORNING   metoprolol succinate 50 MG 24 hr tablet Commonly known as: TOPROL-XL TAKE ONE TABLET BY MOUTH EVERY EVENING   mirtazapine 15 MG tablet Commonly known as: REMERON TAKE ONE TABLET BY MOUTH EVERY EVENING   Pen Needles 31G X 5 MM Misc Use to inject insulin once daily   Comfort EZ Pen Needles 32G X 4 MM Misc Generic drug: Insulin Pen Needle USE AS DIRECTED with insulin injections   rosuvastatin 10 MG tablet Commonly known as: CRESTOR TAKE ONE TABLET BY MOUTH EVERY MORNING   sevelamer carbonate 800 MG tablet Commonly known as: RENVELA Take 800 mg by mouth 3 (three) times daily with meals.        Review of Systems  Constitutional:  Negative for appetite change, chills, fatigue, fever and unexpected weight change.  HENT:  Negative for congestion, dental problem, ear discharge, ear pain, facial swelling,  hearing loss, nosebleeds, postnasal drip, rhinorrhea, sinus pressure, sinus pain, sneezing, sore throat, tinnitus and trouble swallowing.   Eyes:  Negative for pain, discharge, redness, itching and visual disturbance.  Respiratory:  Negative for cough, chest tightness, shortness of breath and wheezing.   Cardiovascular:  Negative for chest pain, palpitations and leg swelling.  Gastrointestinal:  Negative for abdominal distention, abdominal pain, blood in stool, constipation, diarrhea, nausea and vomiting.  Endocrine: Negative for cold intolerance, heat intolerance, polydipsia, polyphagia and polyuria.  Genitourinary:  Negative for difficulty urinating, dysuria, flank pain, frequency and urgency.  Musculoskeletal:  Positive for arthralgias and gait problem. Negative for back pain, joint swelling, myalgias, neck pain and neck stiffness.  Skin:  Negative for color change, pallor, rash and wound.  Neurological:  Negative  for dizziness, syncope, speech difficulty, weakness, light-headedness, numbness and headaches.  Hematological:  Does not bruise/bleed easily.  Psychiatric/Behavioral:  Negative for agitation, behavioral problems, confusion, hallucinations, self-injury, sleep disturbance and suicidal ideas. The patient is not nervous/anxious.     Immunization History  Administered Date(s) Administered   H1N1 04/04/2008   Hepb-cpg 02/07/2021, 03/14/2021, 04/09/2021, 06/04/2021   Influenza Split 02/05/2011, 12/02/2011   Influenza Whole 12/10/2006, 01/08/2009   Influenza, Quadrivalent, Recombinant, Inj, Pf 12/26/2021   Influenza,inj,Quad PF,6+ Mos 02/12/2014, 03/27/2015, 03/05/2020, 01/22/2021   PFIZER(Purple Top)SARS-COV-2 Vaccination 09/29/2019   Pneumococcal Conjugate-13 06/11/2014   Pneumococcal Polysaccharide-23 12/05/2004, 10/30/2010, 01/09/2022   Td 08/09/2003   Pertinent  Health Maintenance Due  Topic Date Due   COLONOSCOPY (Pts 45-47yr Insurance coverage will need to be confirmed)   Never done   OPHTHALMOLOGY EXAM  09/17/2021   MAMMOGRAM  11/23/2021   FOOT EXAM  05/12/2022   HEMOGLOBIN A1C  07/10/2022   PAP SMEAR-Modifier  10/03/2022   INFLUENZA VACCINE  Completed      11/05/2021    7:13 AM 01/01/2022   10:15 AM 01/15/2022    9:27 AM 01/20/2022    1:14 PM 05/05/2022    8:58 AM  Fall Risk  Falls in the past year?  0 1  0  Was there an injury with Fall?  0 0  0  Fall Risk Category Calculator  0 1  0  Fall Risk Category (Retired)  Low Low    (RETIRED) Patient Fall Risk Level Moderate fall risk Low fall risk Low fall risk Low fall risk   Patient at Risk for Falls Due to  No Fall Risks History of fall(s)  No Fall Risks  Fall risk Follow up  Falls evaluation completed   Falls evaluation completed   Functional Status Survey:    Vitals:   05/05/22 1017  BP: 100/62  Pulse: 85  Resp: 18  Temp: 97.8 F (36.6 C)  SpO2: 97%  Weight: 156 lb (70.8 kg)  Height: 5' 6"$  (1.676 m)   Body mass index is 25.18 kg/m. Physical Exam Vitals reviewed.  Constitutional:      General: She is not in acute distress.    Appearance: Normal appearance. She is overweight. She is not ill-appearing or diaphoretic.  HENT:     Head: Normocephalic.     Right Ear: Tympanic membrane, ear canal and external ear normal. There is no impacted cerumen.     Left Ear: Tympanic membrane, ear canal and external ear normal. There is no impacted cerumen.     Nose: Nose normal. No congestion or rhinorrhea.     Mouth/Throat:     Mouth: Mucous membranes are moist.     Pharynx: Oropharynx is clear. No oropharyngeal exudate or posterior oropharyngeal erythema.  Eyes:     General: No scleral icterus.       Right eye: No discharge.        Left eye: No discharge.     Extraocular Movements: Extraocular movements intact.     Conjunctiva/sclera: Conjunctivae normal.     Pupils: Pupils are equal, round, and reactive to light.  Neck:     Vascular: No carotid bruit.  Cardiovascular:     Rate and  Rhythm: Normal rate and regular rhythm.     Pulses: Normal pulses.     Heart sounds: Normal heart sounds. No murmur heard.    No friction rub. No gallop.  Pulmonary:     Effort: Pulmonary effort is normal. No respiratory distress.  Breath sounds: Normal breath sounds. No wheezing, rhonchi or rales.  Chest:     Chest wall: No tenderness.  Abdominal:     General: Bowel sounds are normal. There is no distension.     Palpations: Abdomen is soft. There is no mass.     Tenderness: There is no abdominal tenderness. There is no right CVA tenderness, left CVA tenderness, guarding or rebound.  Musculoskeletal:        General: No swelling or tenderness. Normal range of motion.     Cervical back: Normal range of motion. No rigidity or tenderness.     Right lower leg: No edema.     Left lower leg: No edema.  Lymphadenopathy:     Cervical: No cervical adenopathy.  Skin:    General: Skin is warm and dry.     Coloration: Skin is not pale.     Findings: No bruising, erythema, lesion or rash.  Neurological:     Mental Status: She is alert and oriented to person, place, and time.     Cranial Nerves: No cranial nerve deficit.     Sensory: No sensory deficit.     Motor: No weakness.     Coordination: Coordination normal.     Gait: Gait abnormal.  Psychiatric:        Mood and Affect: Mood normal.        Speech: Speech normal.        Behavior: Behavior normal.        Thought Content: Thought content normal.        Judgment: Judgment normal.     Labs reviewed: Recent Labs    11/04/21 0500 11/05/21 0614 01/08/22 0924 01/20/22 1350 02/24/22 0843  NA 139 134* 127* 132* 136  K 4.2 3.8 4.8 3.9 4.8  CL 107 99 88* 95* 95*  CO2 22 25 22 23 25  $ GLUCOSE 96 160* 598* 504* 369*  BUN 45* 37* 34* 34* 34*  CREATININE 7.37* 5.20* 6.06* 6.42* 5.76*  CALCIUM 8.6* 8.2* 9.4 9.1 9.4  MG 2.1 2.0  --   --   --    Recent Labs    11/04/21 0500 11/05/21 0614 01/08/22 0924 01/20/22 1350  AST 12* 289*  13 14*  ALT 11 31 14 15  $ ALKPHOS 66 134*  --  78  BILITOT 0.3 3.0* 0.5 0.5  PROT 6.1* 5.9* 7.6 7.4  ALBUMIN 2.6* 2.5*  --  3.2*   Recent Labs    11/05/21 0614 01/08/22 0924 01/20/22 1350 02/24/22 0843  WBC 8.6 8.3 6.2  --   NEUTROABS 5.6 3,685 2.9  --   HGB 8.2* 8.2* 8.4* 10.9*  HCT 27.9* 30.6* 29.9* 37.3  MCV 72.1* 72.9* 73.5*  --   PLT 137* 230 194  --    Lab Results  Component Value Date   TSH 1.84 01/08/2022   Lab Results  Component Value Date   HGBA1C 13.3 (H) 01/08/2022   Lab Results  Component Value Date   CHOL 168 01/08/2022   HDL 41 (L) 01/08/2022   LDLCALC 93 01/08/2022   TRIG 258 (H) 01/08/2022   CHOLHDL 4.1 01/08/2022    Significant Diagnostic Results in last 30 days:  No results found.  Assessment/Plan  1. Type 2 diabetes mellitus with hyperlipidemia (HCC) Lab Results  Component Value Date   HGBA1C 13.3 (H) 01/08/2022  Off insulin  Will check lab work then restart medication.  - Hemoglobin A1c - TSH  2. Benign hypertension with ESRD (  end-stage renal disease) ([place]) B/p well controlled. - continue on amlodipine,metoprolol and Imdur  - follow up with Cardiologist as scheduled  - CBC with Differential/Platelet - COMPLETE METABOLIC PANEL WITH GFR  3. Hyperlipidemia LDL goal <100 LDL 93  Continue on Rosuvastatin  - dietary modification advised  - Lipid Panel  4. Chronic systolic congestive heart failure (HCC) No signs of fluid overload on dialysis  - continue to monitor   - COMPLETE METABOLIC PANEL WITH GFR - For home use only DME Other see comment: requires shower chair and Rolator as above.   5. Anxiety and depression Mood stable since she has relocated close to her friends and sister.  6. ESRD (end stage renal disease) ([place]) Continue to follow up with Nephrology.  - COMPLETE METABOLIC PANEL WITH GFR  7. Colon cancer screening Asymptomatic - Ambulatory referral to Gastroenterology  8. Breast cancer screening by  mammogram Asymptomatic  - MM DIGITAL SCREENING BILATERAL  9. Unsteady gait Remains high risk for falls  - For home use only DME 4 wheeled rolling walker with seat [id number] requires Rolator as above  - For home use only DME Other see comment: shower chair   Family/ staff Communication: Reviewed plan of care with patient and sister verbalized understanding   Labs/tests ordered:  - CBC with Differential/Platelet - CMP with eGFR([facility]) - TSH - Hgb A1C - Lipid panel  Next Appointment : Return in about 6 months (around 11/03/2022) for medical mangement of chronic issues.Sandrea Hughs, NP

## 2022-05-05 NOTE — Patient Instructions (Signed)
Please have lab work done during dialysis then fax results to [phone]

## 2022-05-07 ENCOUNTER — Encounter: Payer: Self-pay | Admitting: Cardiology

## 2022-05-07 ENCOUNTER — Ambulatory Visit: Payer: Medicaid Other | Attending: Cardiology | Admitting: Cardiology

## 2022-05-07 VITALS — BP 130/70 | HR 86 | Ht 67.0 in | Wt 156.0 lb

## 2022-05-07 DIAGNOSIS — I5022 Chronic systolic (congestive) heart failure: Secondary | ICD-10-CM | POA: Diagnosis not present

## 2022-05-07 DIAGNOSIS — I5021 Acute systolic (congestive) heart failure: Secondary | ICD-10-CM | POA: Diagnosis not present

## 2022-05-07 DIAGNOSIS — R2681 Unsteadiness on feet: Secondary | ICD-10-CM | POA: Diagnosis not present

## 2022-05-07 DIAGNOSIS — I10 Essential (primary) hypertension: Secondary | ICD-10-CM | POA: Diagnosis not present

## 2022-05-07 NOTE — Progress Notes (Signed)
Clinical Summary Ms. Arnold is a 62 y.o.female  seen today for follow up of the following medical problems.    1. Chronic systolic HF - new diagnosis during 05/2020 admisino - 05/2020 echo LVEF 30-35% - 09/2020 echo LVEF 45%, grade III dd - cath not pursued due to renal dysfunction at the time, medical therapy was also limited at that time. Patient now on HD     07/2021 echo: LVEF 55-60%, no WMAs, grade I dd.  - no SOB/DOE, no LE edema -off hydralazine since our last visit, not sure of the history. Perhaps low bp's during HD?   2. ESRD - followed by nephrology Dr Theador Hawthorne - from notes had nephrtoic range proteinuria, she is on ARB per renal - has HD M,W,F   3. Hyperlipideia - labs followed by pcp  4. HTN - she is compliant with meds Past Medical History:  Diagnosis Date   Allergic rhinitis    Anxiety    CHF (congestive heart failure) ([place])    a. EF 30-35% by echo in 05/2020 b. EF at 45% in 09/2020   Chronic back pain    Chronic hepatitis C without hepatic coma ([place])    COVID-19 virus infection 12/26/2019   Depression    Diabetes mellitus    Hepatitis C    Hypertension    Noncompliance    Poor appetite 07/17/2014   Substance abuse ([facility])    HX of drug use and alcohol use     Allergies  Allergen Reactions   Penicillins Itching and Rash     Current Outpatient Medications  Medication Sig Dispense Refill   albuterol (VENTOLIN HFA) 108 (90 Base) MCG/ACT inhaler Inhale 2 puffs into the lungs every 4 (four) hours as needed for wheezing or shortness of breath. 18 g 1   amLODipine (NORVASC) 10 MG tablet Take 10 mg by mouth daily.     COMFORT EZ PEN NEEDLES 32G X 4 MM MISC USE AS DIRECTED with insulin injections 100 each 1   Insulin Pen Needle (PEN NEEDLES) 31G X 5 MM MISC Use to inject insulin once daily 90 each 3   isosorbide mononitrate (IMDUR) 30 MG 24 hr tablet TAKE ONE TABLET BY MOUTH EVERY MORNING 30 tablet 2   metoprolol succinate (TOPROL-XL) 50 MG 24 hr  tablet TAKE ONE TABLET BY MOUTH EVERY EVENING 30 tablet 2   mirtazapine (REMERON) 15 MG tablet TAKE ONE TABLET BY MOUTH EVERY EVENING 30 tablet 1   rosuvastatin (CRESTOR) 10 MG tablet TAKE ONE TABLET BY MOUTH EVERY MORNING 30 tablet 2   sevelamer carbonate (RENVELA) 800 MG tablet Take 800 mg by mouth 3 (three) times daily with meals.     No current facility-administered medications for this visit.     Past Surgical History:  Procedure Laterality Date   APPENDECTOMY  2004   AV FISTULA PLACEMENT Left 11/26/2020   Procedure: LEFT ARM ARTERIOVENOUS (AV) FISTULA CREATION;  Surgeon: [name], MD;  Location: [facility];  Service: Vascular;  Laterality: Left;   AV FISTULA PLACEMENT Left 06/03/2021   Procedure: INSERTION OF LEFT UPPER ARM ARTERIOVENOUS (AV) GORE-TEX GRAFT;  Surgeon: [name], MD;  Location: [facility];  Service: Vascular;  Laterality: Left;   EXCHANGE OF A DIALYSIS CATHETER Right 02/24/2022   Procedure: EXCHANGE OF A DIALYSIS CATHETER;  Surgeon: [name], MD;  Location: [facility];  Service: Vascular;  Laterality: Right;   IR FLUORO GUIDE CV LINE RIGHT  12/25/2020  IR US GUIDE VASC ACCESS RIGHT  12/25/2020   LIGATION ARTERIOVENOUS GORTEX GRAFT Left 08/05/2021   Procedure: LIGATION OF LEFT ARM ARTERIOVENOUS GORTEX GRAFT;  Surgeon: [name], MD;  Location: [facility];  Service: Vascular;  Laterality: Left;     Allergies  Allergen Reactions   Penicillins Itching and Rash      Family History  Problem Relation Age of Onset   Diabetes Sister        Twin - AIDS      Social History Ms. Aceituno reports that she has been smoking cigarettes. She has been smoking an average of .25 packs per day. She has never used smokeless tobacco. Ms. Nibbe reports that she does not currently use alcohol after a past usage of about 40.0 standard drinks of alcohol per week.   Review of Systems CONSTITUTIONAL: No weight loss, fever, chills, weakness or fatigue.  HEENT: Eyes: No visual loss,  blurred vision, double vision or yellow sclerae.No hearing loss, sneezing, congestion, runny nose or sore throat.  SKIN: No rash or itching.  CARDIOVASCULAR: per hpi RESPIRATORY: No shortness of breath, cough or sputum.  GASTROINTESTINAL: No anorexia, nausea, vomiting or diarrhea. No abdominal pain or blood.  GENITOURINARY: No burning on urination, no polyuria NEUROLOGICAL: No headache, dizziness, syncope, paralysis, ataxia, numbness or tingling in the extremities. No change in bowel or bladder control.  MUSCULOSKELETAL: No muscle, back pain, joint pain or stiffness.  LYMPHATICS: No enlarged nodes. No history of splenectomy.  PSYCHIATRIC: No history of depression or anxiety.  ENDOCRINOLOGIC: No reports of sweating, cold or heat intolerance. No polyuria or polydipsia.  [name]   Physical Examination Today's Vitals   05/07/22 1140  BP: 130/70  Pulse: 86  SpO2: 100%  Weight: 156 lb (70.8 kg)  Height: 5' 7"$  (1.702 m)   Body mass index is 24.43 kg/m.  Gen: resting comfortably, no acute distress HEENT: no scleral icterus, pupils equal round and reactive, no palptable cervical adenopathy,  CV: RRR, no m/rg, no jvd Resp: Clear to auscultation bilaterally GI: abdomen is soft, non-tender, non-distended, normal bowel sounds, no hepatosplenomegaly MSK: extremities are warm, no edema.  Skin: warm, no rash Neuro:  no focal deficits Psych: appropriate affect   Diagnostic Studies  05/2020 echo IMPRESSIONS     1. Left ventricular ejection fraction, by estimation, is 30 to 35%. The  left ventricle has moderately decreased function. The left ventricle  demonstrates global hypokinesis. Left ventricular diastolic parameters are  indeterminate.   2. Right ventricular systolic function is low normal. The right  ventricular size is normal. There is moderately elevated pulmonary artery  systolic pressure. The estimated right ventricular systolic pressure is  [id number] mmHg.   3. A small pericardial  effusion is present. The pericardial effusion is  posterior to the left ventricle.   4. The mitral valve is grossly normal. Mild mitral valve regurgitation.   5. Tricuspid valve regurgitation is moderate.   6. The aortic valve is tricuspid. Aortic valve regurgitation is not  visualized.   7. The inferior vena cava is normal in size with <50% respiratory  variability, suggesting right atrial pressure of 8 mmHg.   07/2021 echo IMPRESSIONS     1. Left ventricular ejection fraction, by estimation, is 55 to 60%. The  left ventricle has normal function. The left ventricle has no regional  wall motion abnormalities. There is mild asymmetric left ventricular  hypertrophy of the septal segment. Left  ventricular diastolic parameters are consistent with Grade I diastolic  dysfunction (impaired relaxation).   2. Right ventricular systolic function is normal. The right ventricular  size is normal. There is normal pulmonary artery systolic pressure. The  estimated right ventricular systolic pressure is 0000000 mmHg.   3. A small pericardial effusion is present. The pericardial effusion is  posterior to the left ventricle and anterior to the right ventricle.   4. The mitral valve is grossly normal. Trivial mitral valve  regurgitation.   5. The inferior vena cava is normal in size with greater than 50%  respiratory variability, suggesting right atrial pressure of 3 mmHg.     Assessment and Plan  1. Chronic systolic HF - LVEF has normalized - appears hydralazine stopped since our last visit, unlcear history. Not sure if perhaps bp issues with HD. Can d/c imdur at this time, continue other meds - no symptoms  2. HTN At goal, continue current meds    F/u 1 year, has moved to [place] with our office there  [name], M.D.,

## 2022-05-07 NOTE — Patient Instructions (Addendum)
Medication Instructions:    STOP Imdur  Labwork: None today  Testing/Procedures: None today  Follow-Up: 1 year in [place]  Any Other Special Instructions Will Be Listed Below (If Applicable).  If you need a refill on your cardiac medications before your next appointment, please call your pharmacy.

## 2022-05-08 LAB — BASIC METABOLIC PANEL: BUN: 50 — AB (ref 4–21)

## 2022-05-08 LAB — LIPID PANEL
Cholesterol: 150 (ref 0–200)
HDL: 28 — AB (ref 35–70)
LDL Cholesterol: 54
Triglycerides: 338 — AB (ref 40–160)

## 2022-05-08 LAB — HEMOGLOBIN A1C: Hemoglobin A1C: 11.9

## 2022-05-08 LAB — TSH: TSH: 1.7 (ref 0.41–5.90)

## 2022-05-09 DIAGNOSIS — N186 End stage renal disease: Secondary | ICD-10-CM | POA: Diagnosis not present

## 2022-05-10 ENCOUNTER — Telehealth: Payer: Self-pay | Admitting: Family

## 2022-05-10 DIAGNOSIS — N186 End stage renal disease: Secondary | ICD-10-CM | POA: Diagnosis not present

## 2022-05-10 NOTE — Telephone Encounter (Signed)
Please call patient to schedule appointment as soon as possible to discuss lab results Hemoglobin A1C very high.

## 2022-05-10 NOTE — Telephone Encounter (Signed)
-----   Message from [place]is [place], [place] sent at 05/08/2022  3:08 PM EST ----- Labs

## 2022-05-11 ENCOUNTER — Other Ambulatory Visit: Payer: Self-pay | Admitting: Cardiology

## 2022-05-12 ENCOUNTER — Other Ambulatory Visit: Payer: Medicaid Other | Admitting: *Deleted

## 2022-05-12 ENCOUNTER — Encounter: Payer: Self-pay | Admitting: *Deleted

## 2022-05-12 NOTE — Patient Outreach (Signed)
Medicaid Managed Care   Nurse Care Manager Note  05/12/2022 Name:  Latoya Walsh MRN:  [id number] DOB:  03/13/1961  Latoya Walsh is an 62 y.o. year old female who is Walsh primary patient of Walsh, Latoya C, NP.  The [facility] Managed Care Coordination team was consulted for assistance with:    CHF DMII  Latoya Walsh was given information about Medicaid Managed Care Coordination team services today. Latoya Walsh Patient agreed to services and verbal consent obtained.  Engaged with patient by telephone for follow up visit in response to provider referral for case management and/or care coordination services.   Assessments/Interventions:  Review of past medical history, allergies, medications, health status, including review of consultants reports, laboratory and other test data, was performed as part of comprehensive evaluation and provision of chronic care management services.  SDOH (Social Determinants of Health) assessments and interventions performed: SDOH Interventions    Flowsheet Row Patient Outreach Telephone from 04/28/2022 in [place] Patient Outreach Telephone from 03/03/2022 in [place] Patient Outreach Telephone from 08/21/2021 in [place] Coordination Patient Outreach Telephone from 07/31/2021 in [place] Patient Outreach Telephone from 07/10/2021 in [place] Patient Outreach Telephone from 06/26/2021 in [place] Interventions        Food Insecurity Interventions Intervention Not Indicated -- -- Other (Comment)  [Advised patient to recertify for food benefits] -- --  Housing Interventions Intervention Not Indicated Intervention Not Indicated -- -- Intervention Not Indicated  [Patient is currently living with her daughter] --  Transportation Interventions  Payor Benefit Intervention Not Indicated -- Other (Comment)  [Provided patient with UHC transportation [phone] -- --  Utilities Interventions Intervention Not Indicated -- -- -- -- --  Depression Interventions/Treatment  -- -- -- -- -- Medication  Stress Interventions -- -- Provide Counseling, [place]  Allergies  Allergen Reactions   Penicillins Itching and Rash    Medications Reviewed Today     Reviewed by Latoya Montane, Walsh (Registered Nurse) on 05/12/22 at 1123  Med List Status: <None>   Medication Order Taking? Sig Documenting Provider Last Dose Status Informant  albuterol (VENTOLIN HFA) 108 (90 Base) MCG/ACT inhaler [id number] Yes Inhale 2 puffs into the lungs every 4 (four) hours as needed for wheezing or shortness of breath. Latoya Walsh D, DO Taking Active Self, Pharmacy Records  amLODipine (NORVASC) 10 MG tablet [id number] Yes Take 10 mg by mouth daily. [provider] Taking Active Self, Pharmacy Records           Med Note Latoya Walsh, Latoya E   Thu Jan 01, 2022 10:10 AM)    COMFORT EZ PEN NEEDLES 32G X 4 MM MISC [id number]  USE AS DIRECTED with insulin injections Latoya Romp, NP  Active Self, Pharmacy Records  Insulin Pen Needle (PEN NEEDLES) 31G X 5 MM MISC [id number]  Use to inject insulin once daily Latoya Romp, NP  Active Self, Pharmacy Records  isosorbide mononitrate (IMDUR) 30 MG 24 hr tablet [id number] Yes TAKE ONE TABLET BY MOUTH EVERY MORNING Walsh, Latoya Guild, MD Taking Active            Med Note Latoya Walsh, Latoya Walsh   Tue May 12, 2022 11:18 AM) D/Walsh'd by cardiology on 05/07/22  metoprolol succinate (TOPROL-XL) 50 MG 24 hr tablet  [id number] Yes TAKE ONE TABLET BY MOUTH EVERY Latoya Salles, MD Taking Active   mirtazapine (REMERON) 15 MG tablet [id number] Yes TAKE ONE TABLET BY MOUTH EVERY EVENING Walsh, Latoya C, NP Taking Active   rosuvastatin (CRESTOR) 10 MG tablet [id number] Yes  TAKE ONE TABLET BY MOUTH EVERY MORNING Walsh, Latoya Guild, MD Taking Active Self, Pharmacy Records  sevelamer carbonate (RENVELA) 800 MG tablet [id number] Yes Take 800 mg by mouth 3 (three) times daily with meals. [provider] Taking Active Self, Pharmacy Records           Med Note Latoya Walsh, [name] Jan 01, 2022 10:12 AM)    Med List Note Latoya Walsh, [place] [id number]):              Patient Active Problem List   Diagnosis Date Noted   ESRD (end stage renal disease) ([place]) 01/06/2021   CHF (congestive heart failure) ([place]) 06/06/2020   Leg swelling 04/28/2020   Hyperlipidemia LDL goal <100 04/28/2020   Left hand pain 03/10/2020   Anemia 02/19/2020   COVID-19 virus infection 12/26/2019   Depression, major, single episode, in partial remission ([place]) 01/25/2019   Headache 11/13/2018   Insomnia 09/25/2018   Screening for colorectal cancer 07/23/2016   Lumbar back pain with radiculopathy affecting right lower extremity 03/27/2015   Non compliance with medical treatment 07/22/2014   Chronic hepatitis Walsh without hepatic coma ([place]) 12/04/2013   Allergic rhinitis 11/03/2012   Anxiety and depression 02/05/2011   Nicotine dependence 11/01/2010   Dermatitis 10/30/2010   Drug dependence, continuous abuse ([place]) 03/22/2010   Uncontrolled type 2 diabetes mellitus with hyperglycemia ([place]) 06/02/2007   Alcohol abuse 06/02/2007    Conditions to be addressed/monitored per PCP order:  CHF and DMII  Care Plan : Walsh Care Manager Plan of Care  Updates made by Latoya Montane, Walsh since 05/12/2022 12:00 AM     Problem: Health Management needs related to DMII and HF      Long-Range Goal: Development of Plan of Care to address Health Management needs related to DMII and HF   Start Date: 07/10/2021  Expected End Date: 06/19/2022  Priority: High  Note:   Current Barriers:  Chronic Disease Management support and education needs related to CHF and DMII Latoya Walsh is out getting  food today and request RNCM talk with her sister, Latoya Walsh.   RNCM Clinical Goal(s):  Patient will verbalize understanding of plan for management of CHF and DMII as evidenced by patient verbalization of self monitoring activities take all medications exactly as prescribed and will call provider for medication related questions as evidenced by documentation in EMR    attend all scheduled medical appointments: Dialysis on M/W/F, schedule follow up with PCP and new patient visit with [facility] as evidenced by provider documentation          Interventions: Inter-disciplinary care team collaboration (see longitudinal plan of care) Evaluation of current treatment plan related to  self management and patient's adherence to plan as established by provider Provided with [facility] [phone] for scheduling new patient visit   Heart Failure Interventions:  (Status: Goal on Track (progressing): YES.)  Long Term Goal  Wt Readings from Last 3 Encounters:  05/07/22 156 lb (70.8 kg)  05/05/22 156 lb (70.8 kg)  02/19/22 156 lb 4.9 oz (70.9 kg)   Provided education on low sodium diet Discussed the importance of keeping all appointments with provider Assessed social determinant of health  barriers Advised patient to report any concerns or changes to provider Encouraged light exercise as tolerated Reviewed recent provider notes from Cardiology visit and discussed with Patient/Sister Discussed medication change, Dr. Harl Bowie discontinued Imdur on 05/07/22, advised Patient/Sister to contact pharmacy to make aware and remove from compliance packaging  Diabetes:  (Status: Goal on track: NO.) Long Term Goal -Recent A1C 11.9, needs follow up with PCP to discuss medication regimen Lab Results  Component Value Date   HGBA1C 11.9 05/08/2022   @ Assessed patient's understanding of A1c goal: <8% Provided education to patient about basic DM disease process; Reviewed prescribed diet with patient diabetic; Counseled  on importance of regular laboratory monitoring as prescribed;        Discussed plans with patient for ongoing care management follow up and provided patient with direct contact information for care management team;      Reviewed scheduled/upcoming provider appointments including: Dialysis on M/W/F, schedule follow up with PCP and new GI appointment;         Review of patient status, including review of consultants reports, relevant laboratory and other test results, and medications completed;       Assessed social determinant of health barriers;        Advised patient to check her BS and take her medications as directed Advised patient/Sister to schedule follow up with PCP for plan of care re: diabetes and elevated A1C Discussed appropriate food choices   Patient Goals/Self-Care Activities: Take medications as prescribed   Attend all scheduled provider appointments Call provider office for new concerns or questions  use salt in moderation eat more whole grains, fruits and vegetables, lean meats and healthy fats dress right for the weather, hot or cold Complete requested lab work       Follow Up:  Patient agrees to Care Plan and Follow-up.  Plan: The Managed Medicaid care management team will reach out to the patient again over the next 30 days.  Date/time of next scheduled Walsh care management/care coordination outreach:  06/16/22 @ [place] Walsh, BSN [place]  [employer] manager

## 2022-05-12 NOTE — Patient Instructions (Signed)
Visit Information  Latoya Walsh was given information about Medicaid Managed Care team care coordination services as a part of their [place] Medicaid benefit. Latoya Walsh verbally consented to engagement with the [facility] Managed Care team.   If you are experiencing a medical emergency, please call 911 or report to your local emergency department or urgent care.   If you have a non-emergency medical problem during routine business hours, please contact your provider's office and ask to speak with a nurse.   For questions related to your [facility], please call: [phone] or visit the homepage here: [email or web address]  If you would like to schedule transportation through your [facility], please call the following number at least 2 days in advance of your appointment: [phone]   Rides for urgent appointments can also be made after hours by calling Member Services.  Call the [place] at [phone], at any time, 24 hours a day, 7 days a week. If you are in danger or need immediate medical attention call 911.  If you would like help to quit smoking, call 1-800-QUIT-NOW (515) 787-7517) OR Espaol: 1-855-Djelo-Ya HD:1601594) o para ms informacin haga clic aqu or Text READY to 200-400 to register via text  Latoya Walsh,   Please see education materials related to DM provided as print materials.   The patient verbalized understanding of instructions, educational materials, and care plan provided today and agreed to receive a mailed copy of patient instructions, educational materials, and care plan.   Telephone follow up appointment with Managed Medicaid care management team member scheduled for:06/16/22 @ [place] Walsh, BSN [place] Walsh Care Coordinator   Following is a copy of your plan  of care:  Care Plan : Walsh Care Manager Plan of Care  Updates made by Latoya Montane, Walsh since 05/12/2022 12:00 AM     Problem: Health Management needs related to DMII and HF      Long-Range Goal: Development of Plan of Care to address Health Management needs related to DMII and HF   Start Date: 07/10/2021  Expected End Date: 06/19/2022  Priority: High  Note:   Current Barriers:  Chronic Disease Management support and education needs related to CHF and DMII Latoya Walsh is out getting food today and request RNCM talk with her sister, Latoya Walsh.   RNCM Clinical Goal(s):  Patient will verbalize understanding of plan for management of CHF and DMII as evidenced by patient verbalization of self monitoring activities take all medications exactly as prescribed and will call provider for medication related questions as evidenced by documentation in EMR    attend all scheduled medical appointments: Dialysis on M/W/F, schedule follow up with PCP and new patient visit with [facility] as evidenced by provider documentation          Interventions: Inter-disciplinary care team collaboration (see longitudinal plan of care) Evaluation of current treatment plan related to  self management and patient's adherence to plan as established by provider Provided with [facility] [phone] for scheduling new patient visit   Heart Failure Interventions:  (Status: Goal on Track (progressing): YES.)  Long Term Goal  Wt Readings from Last 3 Encounters:  05/07/22 156 lb (70.8 kg)  05/05/22 156 lb (70.8 kg)  02/19/22 156 lb 4.9 oz (70.9 kg)   Provided education on low sodium diet Discussed the importance of keeping all appointments with provider Assessed social determinant of health  barriers Advised patient to report any concerns or changes to provider Encouraged light exercise as tolerated Reviewed recent provider notes from Cardiology visit and discussed with Patient/Sister Discussed medication change, Dr. Harl Bowie  discontinued Imdur on 05/07/22, advised Patient/Sister to contact pharmacy to make aware and remove from compliance packaging  Diabetes:  (Status: Goal on track: NO.) Long Term Goal -Recent A1C 11.9, needs follow up with PCP to discuss medication regimen Lab Results  Component Value Date   HGBA1C 11.9 05/08/2022   @ Assessed patient's understanding of A1c goal: <8% Provided education to patient about basic DM disease process; Reviewed prescribed diet with patient diabetic; Counseled on importance of regular laboratory monitoring as prescribed;        Discussed plans with patient for ongoing care management follow up and provided patient with direct contact information for care management team;      Reviewed scheduled/upcoming provider appointments including: Dialysis on M/W/F, schedule follow up with PCP and new GI appointment;         Review of patient status, including review of consultants reports, relevant laboratory and other test results, and medications completed;       Assessed social determinant of health barriers;        Advised patient to check her BS and take her medications as directed Advised patient/Sister to schedule follow up with PCP for plan of care re: diabetes and elevated A1C Discussed appropriate food choices   Patient Goals/Self-Care Activities: Take medications as prescribed   Attend all scheduled provider appointments Call provider office for new concerns or questions  use salt in moderation eat more whole grains, fruits and vegetables, lean meats and healthy fats dress right for the weather, hot or cold Complete requested lab work

## 2022-05-16 DIAGNOSIS — N186 End stage renal disease: Secondary | ICD-10-CM | POA: Diagnosis not present

## 2022-05-17 DIAGNOSIS — N186 End stage renal disease: Secondary | ICD-10-CM | POA: Diagnosis not present

## 2022-05-19 ENCOUNTER — Ambulatory Visit (INDEPENDENT_AMBULATORY_CARE_PROVIDER_SITE_OTHER): Payer: Medicaid Other | Admitting: Family

## 2022-05-19 ENCOUNTER — Encounter: Payer: Self-pay | Admitting: Family

## 2022-05-19 VITALS — BP 128/72 | HR 75 | Temp 97.8°F | Resp 20 | Ht 67.0 in | Wt 158.0 lb

## 2022-05-19 DIAGNOSIS — E785 Hyperlipidemia, unspecified: Secondary | ICD-10-CM

## 2022-05-19 DIAGNOSIS — F5101 Primary insomnia: Secondary | ICD-10-CM | POA: Diagnosis not present

## 2022-05-19 DIAGNOSIS — E1169 Type 2 diabetes mellitus with other specified complication: Secondary | ICD-10-CM | POA: Diagnosis not present

## 2022-05-19 DIAGNOSIS — I120 Hypertensive chronic kidney disease with stage 5 chronic kidney disease or end stage renal disease: Secondary | ICD-10-CM | POA: Diagnosis not present

## 2022-05-19 DIAGNOSIS — N186 End stage renal disease: Secondary | ICD-10-CM | POA: Diagnosis not present

## 2022-05-19 MED ORDER — MIRTAZAPINE 15 MG PO TABS: 15.0000 mg | ORAL_TABLET | Freq: Every evening | ORAL | 1 refills | Status: DC

## 2022-05-19 MED ORDER — ROSUVASTATIN CALCIUM 10 MG PO TABS: 10.0000 mg | ORAL_TABLET | Freq: Every morning | ORAL | 1 refills | Status: DC

## 2022-05-19 NOTE — Progress Notes (Signed)
Provider: Marlowe Sax FNP-C  Doneta Bayman, [name], NP  Patient Care Team: Zanya Lindo, [name], NP as PCP - General (Family Medicine) Harl Bowie [name], MD as PCP - Cardiology (Cardiology) Nesiah Montane, RN as Case Manager  Extended Emergency Contact Information Primary Emergency Contact: Fountain,Candy Mobile Phone: [phone] Relation: Sister Secondary Emergency Contact: Cobb Mobile Phone: [phone] Relation: Daughter  Code Status:  Full Code  Goals of care: Advanced Directive information    05/05/2022    8:57 AM  Advanced Directives  Does Patient Have a Medical Advance Directive? No  Would patient like information on creating a medical advance directive? No - Patient declined     Chief Complaint  Patient presents with   Acute Visit    Patinet presents today for discuss A1C    HPI:  Pt is a 62 y.o. female seen today for an acute visit to discuss recent lab results.she is here with her sister who provides HPI information.  Recent labs done 05/08/2022 showed high Hgb A1C 11.9 and cholesteroll 150,TRG 338,HDL 28 and LDL 54.Platelets were 142 no signs of bleeding reported. Hgb 8.4 and CR 7.8 on HD three times per week. Sister state has taken over guidance when grocery shopping.patient has changed her diet eating more veggies and has cut out sodas,sweets/sugary foods and carbohydrates.Sister has also been encouraging her walk on the days she is off from dialysis. Her Blood sugars prior to changing diet was in the 200's -300's but for the past 5 days with dietary modification blood sugars are down to 94 brought in her CBG log today.  States would like to continue on dietary and exercise modification prior to starting on medication for blood sugar and high cholesterol.  Follows up with [facility] for timing of toenails. Sister states has not scheduled patient for annual eye exam with Ophthalmology.  Has upcoming appointment mammogram.  Referral to GI for colonoscopy  ordered on previous visit.  Request Remeron refill for sleep   Past Medical History:  Diagnosis Date   Allergic rhinitis    Anxiety    CHF (congestive heart failure) ([place])    a. EF 30-35% by echo in 05/2020 b. EF at 45% in 09/2020   Chronic back pain    Chronic hepatitis C without hepatic coma ([place])    COVID-19 virus infection 12/26/2019   Depression    Diabetes mellitus    Hepatitis C    Hypertension    Noncompliance    Poor appetite 07/17/2014   Substance abuse ([place])    HX of drug use and alcohol use   Past Surgical History:  Procedure Laterality Date   APPENDECTOMY  2004   AV FISTULA PLACEMENT Left 11/26/2020   Procedure: LEFT ARM ARTERIOVENOUS (AV) FISTULA CREATION;  Surgeon: [name], MD;  Location: [facility];  Service: Vascular;  Laterality: Left;   AV FISTULA PLACEMENT Left 06/03/2021   Procedure: INSERTION OF LEFT UPPER ARM ARTERIOVENOUS (AV) GORE-TEX GRAFT;  Surgeon: [name], MD;  Location: [facility];  Service: Vascular;  Laterality: Left;   EXCHANGE OF A DIALYSIS CATHETER Right 02/24/2022   Procedure: EXCHANGE OF A DIALYSIS CATHETER;  Surgeon: [name], MD;  Location: [facility];  Service: Vascular;  Laterality: Right;   IR FLUORO GUIDE CV LINE RIGHT  12/25/2020   IR US GUIDE VASC ACCESS RIGHT  12/25/2020   LIGATION ARTERIOVENOUS GORTEX GRAFT Left 08/05/2021   Procedure: LIGATION OF LEFT ARM ARTERIOVENOUS GORTEX GRAFT;  Surgeon: [name], MD;  Location: [facility];  Service: Vascular;  Laterality: Left;    Allergies  Allergen Reactions   Penicillins Itching and Rash    Outpatient Encounter Medications as of 05/19/2022  Medication Sig   albuterol (VENTOLIN HFA) 108 (90 Base) MCG/ACT inhaler Inhale 2 puffs into the lungs every 4 (four) hours as needed for wheezing or shortness of breath.   amLODipine (NORVASC) 10 MG tablet Take 10 mg by mouth daily.   COMFORT EZ PEN NEEDLES 32G X 4 MM MISC USE AS DIRECTED with insulin injections   D3-1000 25 MCG (1000 UT) capsule  Take 1,000 Units by mouth daily.   Insulin Pen Needle (PEN NEEDLES) 31G X 5 MM MISC Use to inject insulin once daily   metoprolol succinate (TOPROL-XL) 50 MG 24 hr tablet TAKE ONE TABLET BY MOUTH EVERY EVENING   mirtazapine (REMERON) 15 MG tablet TAKE ONE TABLET BY MOUTH EVERY EVENING   isosorbide mononitrate (IMDUR) 30 MG 24 hr tablet TAKE ONE TABLET BY MOUTH EVERY MORNING (Patient not taking: Reported on 05/19/2022)   rosuvastatin (CRESTOR) 10 MG tablet TAKE ONE TABLET BY MOUTH EVERY MORNING (Patient not taking: Reported on 05/19/2022)   sevelamer carbonate (RENVELA) 800 MG tablet Take 800 mg by mouth 3 (three) times daily with meals. (Patient not taking: Reported on 05/19/2022)   No facility-administered encounter medications on file as of 05/19/2022.    Review of Systems  Constitutional:  Negative for appetite change, chills, fatigue, fever and unexpected weight change.  HENT:  Negative for congestion, dental problem, ear discharge, ear pain, facial swelling, hearing loss, nosebleeds, postnasal drip, rhinorrhea, sinus pressure, sinus pain, sneezing, sore throat, tinnitus and trouble swallowing.   Eyes:  Negative for pain, discharge, redness, itching and visual disturbance.  Respiratory:  Negative for cough, chest tightness, shortness of breath and wheezing.   Cardiovascular:  Negative for chest pain, palpitations and leg swelling.       HD access on right upper chest   Gastrointestinal:  Negative for abdominal distention, abdominal pain, blood in stool, constipation, diarrhea, nausea and vomiting.  Endocrine: Negative for cold intolerance, heat intolerance, polydipsia, polyphagia and polyuria.  Genitourinary:  Negative for difficulty urinating, dysuria, flank pain, frequency and urgency.       On Hemodialysis Mon-wed and Friday   Musculoskeletal:  Negative for arthralgias, back pain, gait problem, joint swelling, myalgias, neck pain and neck stiffness.  Skin:  Negative for color change, pallor  and rash.  Neurological:  Negative for dizziness, syncope, speech difficulty, weakness, light-headedness, numbness and headaches.  Hematological:  Does not bruise/bleed easily.  Psychiatric/Behavioral:  Positive for sleep disturbance. Negative for agitation, behavioral problems, confusion and hallucinations. The patient is not nervous/anxious.     Immunization History  Administered Date(s) Administered   H1N1 04/04/2008   Hepb-cpg 02/07/2021, 03/14/2021, 04/09/2021, 06/04/2021   Influenza Split 02/05/2011, 12/02/2011   Influenza Whole 12/10/2006, 01/08/2009   Influenza, Quadrivalent, Recombinant, Inj, Pf 12/26/2021   Influenza,inj,Quad PF,6+ Mos 02/12/2014, 03/27/2015, 03/05/2020, 01/22/2021   PFIZER(Purple Top)SARS-COV-2 Vaccination 09/29/2019   Pneumococcal Conjugate-13 06/11/2014   Pneumococcal Polysaccharide-23 12/05/2004, 10/30/2010, 01/09/2022   Td 08/09/2003   Pertinent  Health Maintenance Due  Topic Date Due   COLONOSCOPY (Pts 45-76yr Insurance coverage will need to be confirmed)  Never done   OPHTHALMOLOGY EXAM  09/17/2021   MAMMOGRAM  11/23/2021   FOOT EXAM  05/12/2022   PAP SMEAR-Modifier  10/03/2022   HEMOGLOBIN A1C  11/06/2022   INFLUENZA VACCINE  Completed      11/05/2021  7:13 AM 01/01/2022   10:15 AM 01/15/2022    9:27 AM 01/20/2022    1:14 PM 05/05/2022    8:58 AM  Fall Risk  Falls in the past year?  0 1  0  Was there an injury with Fall?  0 0  0  Fall Risk Category Calculator  0 1  0  Fall Risk Category (Retired)  Low Low    (RETIRED) Patient Fall Risk Level Moderate fall risk Low fall risk Low fall risk Low fall risk   Patient at Risk for Falls Due to  No Fall Risks History of fall(s)  No Fall Risks  Fall risk Follow up  Falls evaluation completed   Falls evaluation completed   Functional Status Survey:    Vitals:   05/19/22 1014  BP: 128/72  Pulse: 75  Resp: 20  Temp: 97.8 F (36.6 C)  SpO2: 99%  Weight: 158 lb (71.7 kg)  Height: 5' 7"$   (1.702 m)   Body mass index is 24.75 kg/m. Physical Exam Vitals reviewed.  Constitutional:      General: She is not in acute distress.    Appearance: Normal appearance. She is normal weight. She is not ill-appearing or diaphoretic.  HENT:     Head: Normocephalic.     Nose: Nose normal. No congestion or rhinorrhea.  Eyes:     General: No scleral icterus.       Right eye: No discharge.        Left eye: No discharge.     Conjunctiva/sclera: Conjunctivae normal.     Pupils: Pupils are equal, round, and reactive to light.  Neck:     Vascular: No carotid bruit.  Cardiovascular:     Rate and Rhythm: Normal rate and regular rhythm.     Pulses: Normal pulses.     Heart sounds: Normal heart sounds. No murmur heard.    No friction rub. No gallop.  Pulmonary:     Effort: Pulmonary effort is normal. No respiratory distress.     Breath sounds: Normal breath sounds. No wheezing, rhonchi or rales.  Chest:     Chest wall: No tenderness.  Abdominal:     General: Bowel sounds are normal. There is no distension.     Palpations: Abdomen is soft. There is no mass.     Tenderness: There is no abdominal tenderness. There is no right CVA tenderness, left CVA tenderness, guarding or rebound.  Musculoskeletal:        General: No swelling or tenderness. Normal range of motion.     Cervical back: Normal range of motion. No rigidity or tenderness.     Right lower leg: No edema.     Left lower leg: No edema.  Lymphadenopathy:     Cervical: No cervical adenopathy.  Skin:    General: Skin is warm and dry.     Coloration: Skin is not pale.     Findings: No bruising, erythema or rash.  Neurological:     Mental Status: She is alert and oriented to person, place, and time.     Motor: No weakness.     Gait: Gait normal.  Psychiatric:        Mood and Affect: Mood normal.        Speech: Speech normal.        Behavior: Behavior normal.     Labs reviewed: Recent Labs    11/04/21 0500 11/05/21 0614  01/08/22 0924 01/20/22 1350 02/24/22 0843 04/30/22 0000 05/08/22 0000  NA 139 134* 127* 132* 136 135*  --   K 4.2 3.8 4.8 3.9 4.8 4.8  --   CL 107 99 88* 95* 95* 99  --   CO2 22 25 22 23 25  $ --   --   GLUCOSE 96 160* 598* 504* 369*  --   --   BUN 45* 37* 34* 34* 34* 36* 50*  CREATININE 7.37* 5.20* 6.06* 6.42* 5.76* 7.8*  --   CALCIUM 8.6* 8.2* 9.4 9.1 9.4 9.4  --   MG 2.1 2.0  --   --   --   --   --    Recent Labs    11/04/21 0500 11/05/21 0614 01/08/22 0924 01/20/22 1350 04/30/22 0000  AST 12* 289* 13 14*  --   ALT 11 31 14 15  $ --   ALKPHOS 66 134*  --  78 96  BILITOT 0.3 3.0* 0.5 0.5  --   PROT 6.1* 5.9* 7.6 7.4  --   ALBUMIN 2.6* 2.5*  --  3.2*  --    Recent Labs    11/05/21 0614 01/08/22 0924 01/20/22 1350 02/24/22 0843 04/30/22 0000  WBC 8.6 8.3 6.2  --  6.3  NEUTROABS 5.6 3,685 2.9  --   --   HGB 8.2* 8.2* 8.4* 10.9* 12.8  HCT 27.9* 30.6* 29.9* 37.3 41  MCV 72.1* 72.9* 73.5*  --   --   PLT 137* 230 194  --  142*   Lab Results  Component Value Date   TSH 1.70 05/08/2022   Lab Results  Component Value Date   HGBA1C 11.9 05/08/2022   Lab Results  Component Value Date   CHOL 150 05/08/2022   HDL 28 (A) 05/08/2022   LDLCALC 54 05/08/2022   TRIG 338 (A) 05/08/2022   CHOLHDL 4.1 01/08/2022    Significant Diagnostic Results in last 30 days:  No results found.  Assessment/Plan 1. Type 2 diabetes mellitus with hyperlipidemia (HCC) Lab Results  Component Value Date   HGBA1C 11.9 05/08/2022  Recommended staring on diabetic medication but declines.Sister has assisted her in changing her diet for the past 5 days since changing her diet her blood sugars are down to 90's -120's  from 200's -300's before change in diet.sisters states will be in charge of her diet. - will refer to Nutritionist for diabetic diet assistance with choosing foods. - Ambulatory referral to diabetic education - TSH; Future - Hemoglobin A1c; Future - will follow up in 3  months  2. Hyperlipidemia LDL goal <100 TRG high  -dietary modification and exercise at least three times per week for 30 minutes.  - rosuvastatin (CRESTOR) 10 MG tablet; Take 1 tablet (10 mg total) by mouth every morning.  Dispense: 90 tablet; Refill: 1 - Lipid panel; Future  3. Benign hypertension with ESRD (end-stage renal disease) ([place]) B/p well controlled  - COMPLETE METABOLIC PANEL WITH GFR; Future - CBC with Differential/Platelet; Future  4. Primary insomnia Continue on Remeron  - mirtazapine (REMERON) 15 MG tablet; Take 1 tablet (15 mg total) by mouth every evening.  Dispense: 30 tablet; Refill: 1   Family/ staff Communication: Reviewed plan of care with patient an sister verbalized understanding  Labs/tests ordered:  - COMPLETE METABOLIC PANEL WITH GFR; Future - CBC with Differential/Platelet; Future - TSH; Future - Hemoglobin A1c; Future - Lipid panel; Future  Next Appointment: Return in about 3 months (around 08/17/2022) for medical mangement of chronic issues., fasting labs prior to visit.   Bram Hottel  Shelva Majestic, NP

## 2022-05-19 NOTE — Patient Instructions (Signed)
-   please get your shingles vaccine at pharmacy

## 2022-05-21 ENCOUNTER — Encounter: Payer: Self-pay | Admitting: Radiology

## 2022-05-21 ENCOUNTER — Ambulatory Visit (INDEPENDENT_AMBULATORY_CARE_PROVIDER_SITE_OTHER): Payer: Medicaid Other | Admitting: Podiatry

## 2022-05-21 DIAGNOSIS — M79675 Pain in left toe(s): Secondary | ICD-10-CM | POA: Diagnosis not present

## 2022-05-21 DIAGNOSIS — E1165 Type 2 diabetes mellitus with hyperglycemia: Secondary | ICD-10-CM

## 2022-05-21 DIAGNOSIS — B351 Tinea unguium: Secondary | ICD-10-CM

## 2022-05-21 DIAGNOSIS — E1122 Type 2 diabetes mellitus with diabetic chronic kidney disease: Secondary | ICD-10-CM | POA: Diagnosis not present

## 2022-05-21 DIAGNOSIS — N186 End stage renal disease: Secondary | ICD-10-CM | POA: Diagnosis not present

## 2022-05-21 DIAGNOSIS — M79674 Pain in right toe(s): Secondary | ICD-10-CM | POA: Diagnosis not present

## 2022-05-21 DIAGNOSIS — Z992 Dependence on renal dialysis: Secondary | ICD-10-CM | POA: Diagnosis not present

## 2022-05-21 NOTE — Progress Notes (Signed)
  Subjective:  Patient ID: Latoya Walsh, female    DOB: [birth year],  MRN: [id number]  Chief Complaint  Patient presents with   Nail Problem    [facility] [id number] A1C-do  not know PCP-[name] PCP VST-do not know     62 y.o. female presents with the above complaint. History confirmed with patient.  Patient presents for diabetic foot care and nail trim.  She had history of type 2 diabetes with neuropathy.  Unable to trim her nails herself.  They do cause her pain due to the length and thickness.  Objective:  Physical Exam: warm, good capillary refill, nail exam onychomycosis of the toenails, onycholysis, and dystrophic nails, no trophic changes or ulcerative lesions. DP pulses palpable, PT pulses palpable, and protective sensation absent Left Foot: normal exam, no swelling, tenderness, instability; ligaments intact, full range of motion of all ankle/foot joints  Right Foot: normal exam, no swelling, tenderness, instability; ligaments intact, full range of motion of all ankle/foot joints   No images are attached to the encounter.  Assessment:   1. Pain due to onychomycosis of toenails of both feet   2. Uncontrolled type 2 diabetes mellitus with hyperglycemia ([place])       Plan:  Patient was evaluated and treated and all questions answered.  Onychomycosis with pain  -Nails palliatively debrided as below. -Educated on self-care  Procedure: Nail Debridement Rationale: Pain Type of Debridement: manual, sharp debridement. Instrumentation: Nail nipper, rotary burr. Number of Nails: 10  Return in about 3 months (around 08/19/2022) for [facility].         Everitt Amber, DPM [facility]

## 2022-05-23 DIAGNOSIS — N186 End stage renal disease: Secondary | ICD-10-CM | POA: Diagnosis not present

## 2022-05-24 DIAGNOSIS — N186 End stage renal disease: Secondary | ICD-10-CM | POA: Diagnosis not present

## 2022-05-30 DIAGNOSIS — N186 End stage renal disease: Secondary | ICD-10-CM | POA: Diagnosis not present

## 2022-05-31 DIAGNOSIS — N186 End stage renal disease: Secondary | ICD-10-CM | POA: Diagnosis not present

## 2022-06-06 DIAGNOSIS — N186 End stage renal disease: Secondary | ICD-10-CM | POA: Diagnosis not present

## 2022-06-07 DIAGNOSIS — N186 End stage renal disease: Secondary | ICD-10-CM | POA: Diagnosis not present

## 2022-06-13 DIAGNOSIS — N186 End stage renal disease: Secondary | ICD-10-CM | POA: Diagnosis not present

## 2022-06-14 DIAGNOSIS — N186 End stage renal disease: Secondary | ICD-10-CM | POA: Diagnosis not present

## 2022-06-16 ENCOUNTER — Ambulatory Visit: Payer: Medicaid Other | Admitting: *Deleted

## 2022-06-17 ENCOUNTER — Other Ambulatory Visit: Payer: Medicaid Other | Admitting: *Deleted

## 2022-06-17 NOTE — Patient Outreach (Signed)
Care Coordination  06/17/2022  Latoya Walsh 02/03/1961 [id number]  Successful RNCM outreach today. However, Ms. Cockey has diaysis today and would like to reschedule. Telephone follow up rescheduled to 06/25/22 @ 3:30pm. Patient agrees to new date and time.  [name] RN, BSN [place] [facility] RN Care Coordinator [phone]

## 2022-06-20 DIAGNOSIS — N186 End stage renal disease: Secondary | ICD-10-CM | POA: Diagnosis not present

## 2022-06-21 DIAGNOSIS — E1122 Type 2 diabetes mellitus with diabetic chronic kidney disease: Secondary | ICD-10-CM | POA: Diagnosis not present

## 2022-06-21 DIAGNOSIS — Z992 Dependence on renal dialysis: Secondary | ICD-10-CM | POA: Diagnosis not present

## 2022-06-21 DIAGNOSIS — N186 End stage renal disease: Secondary | ICD-10-CM | POA: Diagnosis not present

## 2022-06-23 ENCOUNTER — Other Ambulatory Visit: Payer: Medicaid Other | Admitting: *Deleted

## 2022-06-23 NOTE — Patient Instructions (Signed)
Visit Information  [name]  - as a part of your Medicaid benefit, you are eligible for care management and care coordination services at no cost or copay. I was unable to reach you by phone today but would be happy to help you with your health related needs. Please feel free to call me @ [phone].   A member of the Managed Medicaid care management team will reach out to you again over the next 7 days.   [name] RN, BSN [facility] RN Care Coordinator [phone]

## 2022-06-23 NOTE — Patient Outreach (Signed)
  Medicaid Managed Care   Unsuccessful Attempt Note   06/23/2022 Name: Latoya Walsh MRN: [id number] DOB: 10-Aug-1960  Referred by: Sandrea Hughs, NP Reason for referral : High Risk Managed Medicaid (Unsuccessful RNCM follow up telephone outreach)   An unsuccessful telephone outreach was attempted today. The patient was referred to the case management team for assistance with care management and care coordination.    Follow Up Plan: The Managed Medicaid care management team will reach out to the patient again over the next 7 days.    [name] RN, BSN [facility] RN Care Coordinator [phone]

## 2022-06-27 DIAGNOSIS — N186 End stage renal disease: Secondary | ICD-10-CM | POA: Diagnosis not present

## 2022-06-28 DIAGNOSIS — N186 End stage renal disease: Secondary | ICD-10-CM | POA: Diagnosis not present

## 2022-07-04 DIAGNOSIS — N186 End stage renal disease: Secondary | ICD-10-CM | POA: Diagnosis not present

## 2022-07-05 DIAGNOSIS — N186 End stage renal disease: Secondary | ICD-10-CM | POA: Diagnosis not present

## 2022-07-07 ENCOUNTER — Other Ambulatory Visit: Payer: Medicaid Other | Admitting: *Deleted

## 2022-07-07 NOTE — Patient Outreach (Signed)
Medicaid Managed Care   Nurse Care Manager Note  07/07/2022 Name:  Latoya Walsh MRN:  [id number] DOB:  August 30, 1960  Latoya Walsh is an 62 y.o. year old female who is a primary patient of [name], [name], NP.  The [facility] Managed Care Coordination team was consulted for assistance with:    CHF DMII  [name] was given information about Medicaid Managed Care Coordination team services today. [name] Patient agreed to services and verbal consent obtained.  Engaged with patient by telephone for follow up visit in response to provider referral for case management and/or care coordination services.   Assessments/Interventions:  Review of past medical history, allergies, medications, health status, including review of consultants reports, laboratory and other test data, was performed as part of comprehensive evaluation and provision of chronic care management services.  SDOH (Social Determinants of Health) assessments and interventions performed: SDOH Interventions    Flowsheet Row Patient Outreach Telephone from 04/28/2022 in [place] POPULATION HEALTH DEPARTMENT Patient Outreach Telephone from 03/03/2022 in  POPULATION HEALTH DEPARTMENT Patient Outreach Telephone from 08/21/2021 in [facility] Care Coordination Patient Outreach Telephone from 07/31/2021 in [facility] Care Coordination Patient Outreach Telephone from 07/10/2021 in [facility] Care Coordination Patient Outreach Telephone from 06/26/2021 in [facility] Care Coordination  SDOH Interventions        Food Insecurity Interventions Intervention Not Indicated -- -- Other (Comment)  [Advised patient to recertify for food benefits] -- --  Housing Interventions Intervention Not Indicated Intervention Not Indicated -- -- Intervention Not Indicated  [Patient is currently living with her daughter] --  Transportation Interventions  Payor Benefit Intervention Not Indicated -- Other (Comment)  [Provided patient with UHC transportation [phone] -- --  Utilities Interventions Intervention Not Indicated -- -- -- -- --  Depression Interventions/Treatment  -- -- -- -- -- Medication  Stress Interventions -- -- Provide Counseling, [employer] -- -- --       Care Plan  Allergies  Allergen Reactions   Penicillins Itching and Rash    Medications Reviewed Today     Reviewed by Blanca Friend, CMA (Certified Medical Assistant) on 05/21/22 at [phone]  Med List Status: <None>   Medication Order Taking? Sig Documenting Provider Last Dose Status Informant  albuterol (VENTOLIN HFA) 108 (90 Base) MCG/ACT inhaler [id number] No Inhale 2 puffs into the lungs every 4 (four) hours as needed for wheezing or shortness of breath. [name], DO Taking Active Self, Pharmacy Records  amLODipine (NORVASC) 10 MG tablet [id number] No Take 10 mg by mouth daily. [provider] Taking Active Self, Pharmacy Records           Med Note Normand Sloop, [name]   Thu Jan 01, 2022 10:10 AM)    COMFORT EZ PEN NEEDLES 32G X 4 MM MISC [id number] No USE AS DIRECTED with insulin injections [name], NP Taking Active Self, Pharmacy Records  D3-1000 25 MCG (1000 UT) capsule [id number] No Take 1,000 Units by mouth daily. [provider] Taking Active   Insulin Pen Needle (PEN NEEDLES) 31G X 5 MM MISC [id number] No Use to inject insulin once daily [name], NP Taking Active Self, Pharmacy Records  isosorbide mononitrate (IMDUR) 30 MG 24 hr tablet [id number] No TAKE ONE TABLET BY MOUTH EVERY MORNING  Patient not taking: Reported on 05/19/2022   [name], MD Not Taking Active  Med Note (Governor Matos A   Tue May 12, 2022 11:18 AM) D/C'd by cardiology on 05/07/22  metoprolol succinate (TOPROL-XL) 50 MG 24 hr tablet [id number] No TAKE ONE TABLET BY MOUTH EVERY EVENING Branch,  [name], MD Taking Active   mirtazapine (REMERON) 15 MG tablet [id number]  Take 1 tablet (15 mg total) by mouth every evening. [name], [name], NP  Active   rosuvastatin (CRESTOR) 10 MG tablet [id number]  Take 1 tablet (10 mg total) by mouth every morning. [name], [name], NP  Active   sevelamer carbonate (RENVELA) 800 MG tablet [id number] No Take 800 mg by mouth 3 (three) times daily with meals.  Patient not taking: Reported on 05/19/2022   [provider] Not Taking Active Self, Pharmacy Records           Med Note [name] Jan 01, 2022 10:12 AM)    Med List Note [name], [place] [birth year] 4132):              Patient Active Problem List   Diagnosis Date Noted   ESRD (end stage renal disease) 01/06/2021   CHF (congestive heart failure) 06/06/2020   Leg swelling 04/28/2020   Hyperlipidemia LDL goal <100 04/28/2020   Left hand pain 03/10/2020   Anemia 02/19/2020   COVID-19 virus infection 12/26/2019   Depression, major, single episode, in partial remission 01/25/2019   Headache 11/13/2018   Insomnia 09/25/2018   Screening for colorectal cancer 07/23/2016   Lumbar back pain with radiculopathy affecting right lower extremity 03/27/2015   Non compliance with medical treatment 07/22/2014   Chronic hepatitis C without hepatic coma 12/04/2013   Allergic rhinitis 11/03/2012   Anxiety and depression 02/05/2011   Nicotine dependence 11/01/2010   Dermatitis 10/30/2010   Drug dependence, continuous abuse 03/22/2010   Uncontrolled type 2 diabetes mellitus with hyperglycemia 06/02/2007   Alcohol abuse 06/02/2007    Conditions to be addressed/monitored per PCP order:  CHF and DMII  Care Plan : RN Care Manager Plan of Care  Updates made by [name], RN since 07/07/2022 12:00 AM     Problem: Health Management needs related to DMII and HF      Long-Range Goal: Development of Plan of Care to address Health Management needs related to DMII and HF    Start Date: 07/10/2021  Expected End Date: 08/21/2022  Priority: High  Note:   Current Barriers:  Chronic Disease Management support and education needs related to CHF and DMII Ms. Yanko and her sister report improving BS readings. Patient aware of upcoming appointments. Patient reports recently moving, but unable to provide RNCM with new address.   RNCM Clinical Goal(s):  Patient will verbalize understanding of plan for management of CHF and DMII as evidenced by patient verbalization of self monitoring activities take all medications exactly as prescribed and will call provider for medication related questions as evidenced by documentation in EMR    attend all scheduled medical appointments: Dialysis on M/W/F, schedule follow up with PCP and new patient visit with [facility] as evidenced by provider documentation          Interventions: Inter-disciplinary care team collaboration (see longitudinal plan of care) Evaluation of current treatment plan related to  self management and patient's adherence to plan as established by provider   Heart Failure Interventions:  (Status: Condition stable. Not addressed this visit.)  Long Term Goal  Wt Readings from Last 3 Encounters:  05/19/22 158 lb (71.7  kg)  05/07/22 156 lb (70.8 kg)  05/05/22 156 lb (70.8 kg)   Provided education on low sodium diet Discussed the importance of keeping all appointments with provider Assessed social determinant of health barriers Advised patient to report any concerns or changes to provider Encouraged light exercise as tolerated Reviewed recent provider notes from Cardiology visit and discussed with Patient/Sister Discussed medication change, Dr. Wyline Mood discontinued Imdur on 05/07/22, advised Patient/Sister to contact pharmacy to make aware and remove from compliance packaging  Diabetes:  (Status: Goal on Track (progressing): YES.) Long Term Goal -Recent A1C 11.9, reports BS readings improved Lab Results   Component Value Date   HGBA1C 11.9 05/08/2022   @ Assessed patient's understanding of A1c goal: <8% Provided education to patient about basic DM disease process; Reviewed prescribed diet with patient diabetic; Counseled on importance of regular laboratory monitoring as prescribed;        Discussed plans with patient for ongoing care management follow up and provided patient with direct contact information for care management team;      Reviewed scheduled/upcoming provider appointments including: Dialysis on M/W/F, Diabetic Education on 07/09/22 and Mammogram on 07/14/22;         Review of patient status, including review of consultants reports, relevant laboratory and other test results, and medications completed;       Assessed social determinant of health barriers;        Discussed benefits of CGM, advised to discuss with PCP Discussed the importance of attending Diabetic Education    Patient Goals/Self-Care Activities: Take medications as prescribed   Attend all scheduled provider appointments Call provider office for new concerns or questions  use salt in moderation eat more whole grains, fruits and vegetables, lean meats and healthy fats dress right for the weather, hot or cold Complete requested lab work       Follow Up:  Patient agrees to Care Plan and Follow-up.  Plan: The Managed Medicaid care management team will reach out to the patient again over the next 60 days.  Date/time of next scheduled RN care management/care coordination outreach:  09/08/22 @ 10:30am  Estanislado Emms RN, BSN [place]  Managed [facility] RN Care Coordinator [phone]

## 2022-07-07 NOTE — Patient Instructions (Signed)
Visit Information  Ms. Cardon was given information about Medicaid Managed Care team care coordination services as a part of their [facility] Community Plan Medicaid benefit. [name] verbally consented to engagement with the [facility] Managed Care team.   If you are experiencing a medical emergency, please call 911 or report to your local emergency department or urgent care.   If you have a non-emergency medical problem during routine business hours, please contact your provider's office and ask to speak with a nurse.   For questions related to your [facility], please call: [phone] or visit the homepage here: [email or web address]  If you would like to schedule transportation through your [facility], please call the following number at least 2 days in advance of your appointment: [phone]   Rides for urgent appointments can also be made after hours by calling Member Services.  Call the Behavioral Health Crisis Line at [phone], at any time, 24 hours a day, 7 days a week. If you are in danger or need immediate medical attention call 911.  If you would like help to quit smoking, call 1-800-QUIT-NOW (5011938127) OR Espaol: 1-855-Djelo-Ya (0-102-725-3664) o para ms informacin haga clic aqu or Text READY to 403-474 to register via text  [name],   Please see education materials related to DM provided by MyChart link.  Patient verbalizes understanding of instructions and care plan provided today and agrees to view in MyChart. Active MyChart status and patient understanding of how to access instructions and care plan via MyChart confirmed with patient.     Telephone follow up appointment with Managed Medicaid care management team member scheduled for:09/08/22 @ 10:30am  Estanislado Emms RN, BSN [place]  Managed [facility] RN Care  Coordinator [phone]   Following is a copy of your plan of care:  Care Plan : RN Care Manager Plan of Care  Updates made by [name], RN since 07/07/2022 12:00 AM     Problem: Health Management needs related to DMII and HF      Long-Range Goal: Development of Plan of Care to address Health Management needs related to DMII and HF   Start Date: 07/10/2021  Expected End Date: 08/21/2022  Priority: High  Note:   Current Barriers:  Chronic Disease Management support and education needs related to CHF and DMII Ms. Minotti and her sister report improving BS readings. Patient aware of upcoming appointments. Patient reports recently moving, but unable to provide RNCM with new address.   RNCM Clinical Goal(s):  Patient will verbalize understanding of plan for management of CHF and DMII as evidenced by patient verbalization of self monitoring activities take all medications exactly as prescribed and will call provider for medication related questions as evidenced by documentation in EMR    attend all scheduled medical appointments: Dialysis on M/W/F, schedule follow up with PCP and new patient visit with [facility] as evidenced by provider documentation          Interventions: Inter-disciplinary care team collaboration (see longitudinal plan of care) Evaluation of current treatment plan related to  self management and patient's adherence to plan as established by provider   Heart Failure Interventions:  (Status: Condition stable. Not addressed this visit.)  Long Term Goal  Wt Readings from Last 3 Encounters:  05/19/22 158 lb (71.7 kg)  05/07/22 156 lb (70.8 kg)  05/05/22 156 lb (70.8 kg)   Provided education on low sodium diet Discussed the importance of keeping all  appointments with provider Assessed social determinant of health barriers Advised patient to report any concerns or changes to provider Encouraged light exercise as tolerated Reviewed recent provider notes from  Cardiology visit and discussed with Patient/Sister Discussed medication change, Dr. Wyline Mood discontinued Imdur on 05/07/22, advised Patient/Sister to contact pharmacy to make aware and remove from compliance packaging  Diabetes:  (Status: Goal on Track (progressing): YES.) Long Term Goal -Recent A1C 11.9, reports BS readings improved Lab Results  Component Value Date   HGBA1C 11.9 05/08/2022   @ Assessed patient's understanding of A1c goal: <8% Provided education to patient about basic DM disease process; Reviewed prescribed diet with patient diabetic; Counseled on importance of regular laboratory monitoring as prescribed;        Discussed plans with patient for ongoing care management follow up and provided patient with direct contact information for care management team;      Reviewed scheduled/upcoming provider appointments including: Dialysis on M/W/F, Diabetic Education on 07/09/22 and Mammogram on 07/14/22;         Review of patient status, including review of consultants reports, relevant laboratory and other test results, and medications completed;       Assessed social determinant of health barriers;        Discussed benefits of CGM, advised to discuss with PCP Discussed the importance of attending Diabetic Education    Patient Goals/Self-Care Activities: Take medications as prescribed   Attend all scheduled provider appointments Call provider office for new concerns or questions  use salt in moderation eat more whole grains, fruits and vegetables, lean meats and healthy fats dress right for the weather, hot or cold Complete requested lab work

## 2022-07-09 ENCOUNTER — Ambulatory Visit: Payer: Medicaid Other | Admitting: Dietician

## 2022-07-09 ENCOUNTER — Other Ambulatory Visit: Payer: Self-pay | Admitting: Family

## 2022-07-09 DIAGNOSIS — E1165 Type 2 diabetes mellitus with hyperglycemia: Secondary | ICD-10-CM

## 2022-07-09 NOTE — Telephone Encounter (Signed)
Please verify medication with patient and sister.

## 2022-07-10 ENCOUNTER — Telehealth: Payer: Self-pay

## 2022-07-10 NOTE — Telephone Encounter (Signed)
Verify dosage then update medication list and send refills.

## 2022-07-10 NOTE — Telephone Encounter (Signed)
[facility] requesting refill on Lantus insulin. Medication not on patient's active med list. Looks like medication was discontinued 05/05/22.  I called patient and she stated that she is still taking lantus.  Message routed to Richarda Blade, NP

## 2022-07-11 DIAGNOSIS — N186 End stage renal disease: Secondary | ICD-10-CM | POA: Diagnosis not present

## 2022-07-12 DIAGNOSIS — N186 End stage renal disease: Secondary | ICD-10-CM | POA: Diagnosis not present

## 2022-07-14 ENCOUNTER — Ambulatory Visit
Admission: RE | Admit: 2022-07-14 | Discharge: 2022-07-14 | Disposition: A | Discharge status: Home / Self Care | Payer: Medicaid Other | Source: Ambulatory Visit | Attending: Family | Admitting: Family

## 2022-07-14 DIAGNOSIS — Z1231 Encounter for screening mammogram for malignant neoplasm of breast: Secondary | ICD-10-CM | POA: Diagnosis not present

## 2022-07-18 DIAGNOSIS — N186 End stage renal disease: Secondary | ICD-10-CM | POA: Diagnosis not present

## 2022-07-19 DIAGNOSIS — N186 End stage renal disease: Secondary | ICD-10-CM | POA: Diagnosis not present

## 2022-07-21 DIAGNOSIS — Z992 Dependence on renal dialysis: Secondary | ICD-10-CM | POA: Diagnosis not present

## 2022-07-21 DIAGNOSIS — E1122 Type 2 diabetes mellitus with diabetic chronic kidney disease: Secondary | ICD-10-CM | POA: Diagnosis not present

## 2022-07-21 DIAGNOSIS — N186 End stage renal disease: Secondary | ICD-10-CM | POA: Diagnosis not present

## 2022-07-25 DIAGNOSIS — N186 End stage renal disease: Secondary | ICD-10-CM | POA: Diagnosis not present

## 2022-07-26 DIAGNOSIS — N186 End stage renal disease: Secondary | ICD-10-CM | POA: Diagnosis not present

## 2022-08-01 DIAGNOSIS — N186 End stage renal disease: Secondary | ICD-10-CM | POA: Diagnosis not present

## 2022-08-02 DIAGNOSIS — N186 End stage renal disease: Secondary | ICD-10-CM | POA: Diagnosis not present

## 2022-08-05 ENCOUNTER — Other Ambulatory Visit: Payer: Self-pay

## 2022-08-05 DIAGNOSIS — F5101 Primary insomnia: Secondary | ICD-10-CM

## 2022-08-05 MED ORDER — MIRTAZAPINE 15 MG PO TABS: 15.0000 mg | ORAL_TABLET | Freq: Every evening | ORAL | 2 refills | Status: DC

## 2022-08-08 DIAGNOSIS — N186 End stage renal disease: Secondary | ICD-10-CM | POA: Diagnosis not present

## 2022-08-09 DIAGNOSIS — N186 End stage renal disease: Secondary | ICD-10-CM | POA: Diagnosis not present

## 2022-08-10 ENCOUNTER — Other Ambulatory Visit: Payer: Self-pay | Admitting: Cardiology

## 2022-08-11 ENCOUNTER — Other Ambulatory Visit: Payer: Self-pay | Admitting: Family

## 2022-08-11 DIAGNOSIS — E1165 Type 2 diabetes mellitus with hyperglycemia: Secondary | ICD-10-CM

## 2022-08-15 DIAGNOSIS — N186 End stage renal disease: Secondary | ICD-10-CM | POA: Diagnosis not present

## 2022-08-16 DIAGNOSIS — N186 End stage renal disease: Secondary | ICD-10-CM | POA: Diagnosis not present

## 2022-08-18 ENCOUNTER — Encounter: Payer: Self-pay | Admitting: Family

## 2022-08-18 ENCOUNTER — Ambulatory Visit (INDEPENDENT_AMBULATORY_CARE_PROVIDER_SITE_OTHER): Payer: Medicaid Other | Admitting: Family

## 2022-08-18 VITALS — BP 132/84 | HR 81 | Temp 96.7°F | Ht 67.0 in | Wt 154.8 lb

## 2022-08-18 DIAGNOSIS — Z1211 Encounter for screening for malignant neoplasm of colon: Secondary | ICD-10-CM | POA: Diagnosis not present

## 2022-08-18 DIAGNOSIS — E785 Hyperlipidemia, unspecified: Secondary | ICD-10-CM | POA: Diagnosis not present

## 2022-08-18 DIAGNOSIS — E1169 Type 2 diabetes mellitus with other specified complication: Secondary | ICD-10-CM

## 2022-08-18 NOTE — Progress Notes (Signed)
Provider: Richarda Blade FNP-C   Theoplis Garciagarcia, Donalee Citrin, NP  Patient Care Team: Ruta Capece, Donalee Citrin, NP as PCP - General (Family Medicine) Wyline Mood [name], MD as PCP - Cardiology (Cardiology) [name], RN as Case Manager  Extended Emergency Contact Information Primary Emergency Contact: Flippen,Shavon Mobile Phone: [phone] Relation: Daughter Secondary Emergency Contact: Fountain,Chandy Mobile Phone: [phone] Relation: Sister  Code Status:  Full Code  Goals of care: Advanced Directive information    05/05/2022    8:57 AM  Advanced Directives  Does Patient Have a Medical Advance Directive? No  Would patient like information on creating a medical advance directive? No - Patient declined     Chief Complaint  Patient presents with   Medical Management of Chronic Issues    Patient presents today for a 3 month follow-up   Quality Metric Gaps    Pap smear, foot & eye exam, colonoscopy, zoster, COVID#2    HPI:  Pt is a 62 y.o. female seen today for 3 months follow up for medical management of chronic diseases.  Has some medical history of hyperlipidemia, type 2 diabetes, essential hypertension, anxiety and depression among others Walks two laps in the park on Tuesdays and Thursday. Her sister has been cooking for her healthy food. Has had 4 lbs weight loss since last visit.   States blood sugars runs in the 160's at bedtime .sometimes the insulin pen does not work.will be starting on a new insulin pen soon. Advised to have her sister check on the pen then update provider or pharmacist if not working. Has upcoming appointment with podiatrist on 08/31/2022 at [facility] .  Due for COVId-19 booster vaccine but declines. Advised to get shingles vaccine at the pharmacy.  Also due for cervical cancer screening.  Past Medical History:  Diagnosis Date   Allergic rhinitis    Anxiety    CHF (congestive heart failure) (HCC)    a. EF 30-35% by echo in 05/2020 b. EF at 45% in  09/2020   Chronic back pain    Chronic hepatitis C without hepatic coma (HCC)    COVID-19 virus infection 12/26/2019   Depression    Diabetes mellitus    Hepatitis C    Hypertension    Noncompliance    Poor appetite 07/17/2014   Substance abuse (HCC)    HX of drug use and alcohol use   Past Surgical History:  Procedure Laterality Date   APPENDECTOMY  2004   AV FISTULA PLACEMENT Left 11/26/2020   Procedure: LEFT ARM ARTERIOVENOUS (AV) FISTULA CREATION;  Surgeon: Larina Earthly, MD;  Location: [facility];  Service: Vascular;  Laterality: Left;   AV FISTULA PLACEMENT Left 06/03/2021   Procedure: INSERTION OF LEFT UPPER ARM ARTERIOVENOUS (AV) GORE-TEX GRAFT;  Surgeon: Larina Earthly, MD;  Location: [facility];  Service: Vascular;  Laterality: Left;   EXCHANGE OF A DIALYSIS CATHETER Right 02/24/2022   Procedure: EXCHANGE OF A DIALYSIS CATHETER;  Surgeon: Larina Earthly, MD;  Location: [facility];  Service: Vascular;  Laterality: Right;   IR FLUORO GUIDE CV LINE RIGHT  12/25/2020   IR US GUIDE VASC ACCESS RIGHT  12/25/2020   LIGATION ARTERIOVENOUS GORTEX GRAFT Left 08/05/2021   Procedure: LIGATION OF LEFT ARM ARTERIOVENOUS GORTEX GRAFT;  Surgeon: Larina Earthly, MD;  Location: [facility];  Service: Vascular;  Laterality: Left;    Allergies  Allergen Reactions   Penicillins Itching and Rash    Allergies as of 08/18/2022  Reactions   Penicillins Itching, Rash        Medication List        Accurate as of Aug 18, 2022  9:38 AM. If you have any questions, ask your nurse or doctor.          albuterol 108 (90 Base) MCG/ACT inhaler Commonly known as: VENTOLIN HFA Inhale 2 puffs into the lungs every 4 (four) hours as needed for wheezing or shortness of breath.   amLODipine 10 MG tablet Commonly known as: NORVASC Take 10 mg by mouth daily.   D3-1000 25 MCG (1000 UT) capsule Generic drug: Cholecalciferol Take 1,000 Units by mouth daily.   isosorbide mononitrate 30 MG 24 hr tablet Commonly  known as: IMDUR TAKE ONE TABLET BY MOUTH EVERY MORNING   metoprolol succinate 50 MG 24 hr tablet Commonly known as: TOPROL-XL TAKE ONE TABLET BY MOUTH EVERY EVENING   mirtazapine 15 MG tablet Commonly known as: REMERON Take 1 tablet (15 mg total) by mouth every evening.   Pen Needles 31G X 5 MM Misc Use to inject insulin once daily   Comfort EZ Pen Needles 32G X 4 MM Misc Generic drug: Insulin Pen Needle USE AS DIRECTED with insulin injections   rosuvastatin 10 MG tablet Commonly known as: CRESTOR Take 1 tablet (10 mg total) by mouth every morning.   sevelamer carbonate 800 MG tablet Commonly known as: RENVELA Take 800 mg by mouth 3 (three) times daily with meals.        Review of Systems  Constitutional:  Negative for appetite change, chills, fatigue, fever and unexpected weight change.  HENT:  Negative for congestion, dental problem, ear discharge, ear pain, facial swelling, hearing loss, nosebleeds, postnasal drip, rhinorrhea, sinus pressure, sinus pain, sneezing, sore throat, tinnitus and trouble swallowing.   Eyes:  Negative for pain, discharge, redness, itching and visual disturbance.  Respiratory:  Negative for cough, chest tightness, shortness of breath and wheezing.   Cardiovascular:  Negative for chest pain, palpitations and leg swelling.  Gastrointestinal:  Negative for abdominal distention, abdominal pain, blood in stool, constipation, diarrhea, nausea and vomiting.  Endocrine: Negative for cold intolerance, heat intolerance, polydipsia, polyphagia and polyuria.  Genitourinary:  Negative for difficulty urinating, dysuria, flank pain, frequency and urgency.  Musculoskeletal:  Negative for arthralgias, back pain, gait problem, joint swelling, myalgias, neck pain and neck stiffness.  Skin:  Negative for color change, pallor, rash and wound.  Neurological:  Negative for dizziness, syncope, speech difficulty, weakness, light-headedness, numbness and headaches.   Hematological:  Does not bruise/bleed easily.  Psychiatric/Behavioral:  Negative for agitation, behavioral problems, confusion, hallucinations, self-injury, sleep disturbance and suicidal ideas. The patient is not nervous/anxious.     Immunization History  Administered Date(s) Administered   H1N1 04/04/2008   Hepb-cpg 02/07/2021, 03/14/2021, 04/09/2021, 06/04/2021   Influenza Split 02/05/2011, 12/02/2011   Influenza Whole 12/10/2006, 01/08/2009   Influenza, Quadrivalent, Recombinant, Inj, Pf 12/26/2021   Influenza,inj,Quad PF,6+ Mos 02/12/2014, 03/27/2015, 03/05/2020, 01/22/2021   PFIZER(Purple Top)SARS-COV-2 Vaccination 09/29/2019   Pneumococcal Conjugate-13 06/11/2014   Pneumococcal Polysaccharide-23 12/05/2004, 10/30/2010, 01/09/2022   Td 08/09/2003   Pertinent  Health Maintenance Due  Topic Date Due   Colonoscopy  Never done   OPHTHALMOLOGY EXAM  09/17/2021   FOOT EXAM  05/12/2022   PAP SMEAR-Modifier  10/03/2022   INFLUENZA VACCINE  10/22/2022   HEMOGLOBIN A1C  11/06/2022   MAMMOGRAM  07/13/2024      01/01/2022   10:15 AM 01/15/2022    9:27 AM 01/20/2022  1:14 PM 05/05/2022    8:58 AM 08/18/2022    9:20 AM  Fall Risk  Falls in the past year? 0 1  0 0  Was there an injury with Fall? 0 0  0 0  Fall Risk Category Calculator 0 1  0 0  Fall Risk Category (Retired) Low Low     (RETIRED) Patient Fall Risk Level Low fall risk Low fall risk Low fall risk    Patient at Risk for Falls Due to No Fall Risks History of fall(s)  No Fall Risks No Fall Risks  Fall risk Follow up Falls evaluation completed   Falls evaluation completed Falls evaluation completed   Functional Status Survey:    Vitals:   08/18/22 0913  BP: 132/84  Pulse: 81  Temp: (!) 96.7 F (35.9 C)  SpO2: 99%  Weight: 154 lb 12.8 oz (70.2 kg)  Height: 5\' 7"  (1.702 m)   Body mass index is 24.25 kg/m. Physical Exam Vitals reviewed.  Constitutional:      General: She is not in acute distress.     Appearance: Normal appearance. She is normal weight. She is not ill-appearing or diaphoretic.  HENT:     Head: Normocephalic.     Right Ear: Tympanic membrane, ear canal and external ear normal. There is no impacted cerumen.     Left Ear: Tympanic membrane, ear canal and external ear normal. There is no impacted cerumen.     Nose: Nose normal. No congestion or rhinorrhea.     Mouth/Throat:     Mouth: Mucous membranes are moist.     Pharynx: Oropharynx is clear. No oropharyngeal exudate or posterior oropharyngeal erythema.  Eyes:     General: No scleral icterus.       Right eye: No discharge.        Left eye: No discharge.     Extraocular Movements: Extraocular movements intact.     Conjunctiva/sclera: Conjunctivae normal.     Pupils: Pupils are equal, round, and reactive to light.  Neck:     Vascular: No carotid bruit.  Cardiovascular:     Rate and Rhythm: Normal rate and regular rhythm.     Pulses: Normal pulses.     Heart sounds: Normal heart sounds. No murmur heard.    No friction rub. No gallop.     Comments: HD access on right upper chest  Pulmonary:     Effort: Pulmonary effort is normal. No respiratory distress.     Breath sounds: Normal breath sounds. No wheezing, rhonchi or rales.  Chest:     Chest wall: No tenderness.  Abdominal:     General: Bowel sounds are normal. There is no distension.     Palpations: Abdomen is soft. There is no mass.     Tenderness: There is no abdominal tenderness. There is no right CVA tenderness, left CVA tenderness, guarding or rebound.  Musculoskeletal:        General: No swelling or tenderness. Normal range of motion.     Cervical back: Normal range of motion. No rigidity or tenderness.     Right lower leg: No edema.     Left lower leg: No edema.  Lymphadenopathy:     Cervical: No cervical adenopathy.  Skin:    General: Skin is warm and dry.     Coloration: Skin is not pale.     Findings: No bruising, erythema, lesion or rash.   Neurological:     Mental Status: She is alert and oriented  to person, place, and time.     Cranial Nerves: No cranial nerve deficit.     Sensory: No sensory deficit.     Motor: No weakness.     Coordination: Coordination normal.     Gait: Gait normal.  Psychiatric:        Mood and Affect: Mood normal.        Speech: Speech normal.        Behavior: Behavior normal.        Thought Content: Thought content normal.        Judgment: Judgment normal.     Labs reviewed: Recent Labs    11/04/21 0500 11/05/21 0614 01/08/22 0924 01/20/22 1350 02/24/22 0843 04/30/22 0000 05/08/22 0000  NA 139 134* 127* 132* 136 135*  --   K 4.2 3.8 4.8 3.9 4.8 4.8  --   CL 107 99 88* 95* 95* 99  --   CO2 22 25 22 23 25   --   --   GLUCOSE 96 160* 598* 504* 369*  --   --   BUN 45* 37* 34* 34* 34* 36* 50*  CREATININE 7.37* 5.20* 6.06* 6.42* 5.76* 7.8*  --   CALCIUM 8.6* 8.2* 9.4 9.1 9.4 9.4  --   MG 2.1 2.0  --   --   --   --   --    Recent Labs    11/04/21 0500 11/05/21 0614 01/08/22 0924 01/20/22 1350 04/30/22 0000  AST 12* 289* 13 14*  --   ALT 11 31 14 15   --   ALKPHOS 66 134*  --  78 96  BILITOT 0.3 3.0* 0.5 0.5  --   PROT 6.1* 5.9* 7.6 7.4  --   ALBUMIN 2.6* 2.5*  --  3.2*  --    Recent Labs    11/05/21 0614 01/08/22 0924 01/20/22 1350 02/24/22 0843 04/30/22 0000  WBC 8.6 8.3 6.2  --  6.3  NEUTROABS 5.6 3,685 2.9  --   --   HGB 8.2* 8.2* 8.4* 10.9* 12.8  HCT 27.9* 30.6* 29.9* 37.3 41  MCV 72.1* 72.9* 73.5*  --   --   PLT 137* 230 194  --  142*   Lab Results  Component Value Date   TSH 1.70 05/08/2022   Lab Results  Component Value Date   HGBA1C 11.9 05/08/2022   Lab Results  Component Value Date   CHOL 150 05/08/2022   HDL 28 (A) 05/08/2022   LDLCALC 54 05/08/2022   TRIG 338 (A) 05/08/2022   CHOLHDL 4.1 01/08/2022    Significant Diagnostic Results in last 30 days:  No results found.  Assessment/Plan  1. Type 2 diabetes mellitus with hyperlipidemia  (HCC) Lab Results  Component Value Date   HGBA1C 11.9 05/08/2022  - reports CBG in the 160's but sometimes has trouble with insulin pen not working.Advised to have her sister check on the insulin pen then notify provider or pharmacist  - monofilament sensation intact  - Ambulatory referral to Ophthalmology - Hemoglobin A1c - Lipid Panel  2. Colon cancer screening Symptomatic - Ambulatory referral to Gastroenterology  3. Hyperlipidemia LDL goal <100 Previous LDL was at goal but triglycerides were elevated - Recommend a low saturated fats, low carbohydrates and high vegetable diet also exercise at least 3 times per week for 30 minutes. - Lipid Panel  Family/ staff Communication: Reviewed plan of care with patient verbalized understanding  Labs/tests ordered:   - Hemoglobin A1c - Lipid Panel  Next  Appointment : Return in about 6 months (around 02/18/2023) for medical mangement of chronic issues., Fasting labs in 6 months prior to visit.   Caesar Bookman, NP

## 2022-08-19 LAB — LIPID PANEL
Cholesterol: 241 mg/dL — ABNORMAL HIGH (ref <200)
HDL: 36 mg/dL — ABNORMAL LOW (ref >=50)
LDL Cholesterol (Calc): 154 mg/dL (calc) — ABNORMAL HIGH
Non-HDL Cholesterol (Calc): 205 mg/dL (calc) — ABNORMAL HIGH (ref <130)
Total CHOL/HDL Ratio: 6.7 (calc) — ABNORMAL HIGH (ref <5.0)
Triglycerides: 329 mg/dL — ABNORMAL HIGH (ref <150)

## 2022-08-19 LAB — HEMOGLOBIN A1C
Hgb A1c MFr Bld: 8.3 % of total Hgb — ABNORMAL HIGH (ref <5.7)
Mean Plasma Glucose: 192 mg/dL
eAG (mmol/L): 10.6 mmol/L

## 2022-08-21 DIAGNOSIS — E1122 Type 2 diabetes mellitus with diabetic chronic kidney disease: Secondary | ICD-10-CM | POA: Diagnosis not present

## 2022-08-21 DIAGNOSIS — N186 End stage renal disease: Secondary | ICD-10-CM | POA: Diagnosis not present

## 2022-08-21 DIAGNOSIS — Z992 Dependence on renal dialysis: Secondary | ICD-10-CM | POA: Diagnosis not present

## 2022-08-22 DIAGNOSIS — N186 End stage renal disease: Secondary | ICD-10-CM | POA: Diagnosis not present

## 2022-08-23 DIAGNOSIS — N186 End stage renal disease: Secondary | ICD-10-CM | POA: Diagnosis not present

## 2022-08-27 ENCOUNTER — Ambulatory Visit (INDEPENDENT_AMBULATORY_CARE_PROVIDER_SITE_OTHER): Payer: Medicaid Other | Admitting: Podiatry

## 2022-08-27 DIAGNOSIS — Z91199 Patient's noncompliance with other medical treatment and regimen due to unspecified reason: Secondary | ICD-10-CM

## 2022-08-27 NOTE — Progress Notes (Signed)
Pt was a no show for apt, [place] 

## 2022-08-29 DIAGNOSIS — N186 End stage renal disease: Secondary | ICD-10-CM | POA: Diagnosis not present

## 2022-08-30 DIAGNOSIS — N186 End stage renal disease: Secondary | ICD-10-CM | POA: Diagnosis not present

## 2022-09-04 ENCOUNTER — Other Ambulatory Visit: Payer: Self-pay | Admitting: Family

## 2022-09-04 DIAGNOSIS — E1165 Type 2 diabetes mellitus with hyperglycemia: Secondary | ICD-10-CM

## 2022-09-05 DIAGNOSIS — N186 End stage renal disease: Secondary | ICD-10-CM | POA: Diagnosis not present

## 2022-09-06 DIAGNOSIS — N186 End stage renal disease: Secondary | ICD-10-CM | POA: Diagnosis not present

## 2022-09-08 ENCOUNTER — Other Ambulatory Visit: Payer: Medicaid Other | Admitting: *Deleted

## 2022-09-08 NOTE — Patient Instructions (Signed)
Visit Information  [name]  - as a part of your Medicaid benefit, you are eligible for care management and care coordination services at no cost or copay. I was unable to reach you by phone today but would be happy to help you with your health related needs. Please feel free to call me @ [phone].   A member of the Managed Medicaid care management team will reach out to you again over the next 7 days.   Haven Foss RN, BSN [place]  Managed Medicaid RN Care Coordinator [phone]   

## 2022-09-08 NOTE — Patient Outreach (Signed)
  Medicaid Managed Care   Unsuccessful Attempt Note   09/08/2022 Name: Latoya Walsh MRN: [id number] DOB: 1961-02-20  Referred by: Caesar Bookman, NP Reason for referral : High Risk Managed Medicaid (Unsuccessful RNCM follow up telephone outreach)   An unsuccessful telephone outreach was attempted today. The patient was referred to the case management team for assistance with care management and care coordination.    Follow Up Plan: The Managed Medicaid care management team will reach out to the patient again over the next 7 days.    Estanislado Emms RN, BSN [place]  Managed [facility] RN Care Coordinator [phone]

## 2022-09-10 ENCOUNTER — Ambulatory Visit: Payer: Medicaid Other | Admitting: Dietician

## 2022-09-12 DIAGNOSIS — N186 End stage renal disease: Secondary | ICD-10-CM | POA: Diagnosis not present

## 2022-09-13 DIAGNOSIS — N186 End stage renal disease: Secondary | ICD-10-CM | POA: Diagnosis not present

## 2022-09-19 DIAGNOSIS — N186 End stage renal disease: Secondary | ICD-10-CM | POA: Diagnosis not present

## 2022-09-20 DIAGNOSIS — E1122 Type 2 diabetes mellitus with diabetic chronic kidney disease: Secondary | ICD-10-CM | POA: Diagnosis not present

## 2022-09-20 DIAGNOSIS — Z992 Dependence on renal dialysis: Secondary | ICD-10-CM | POA: Diagnosis not present

## 2022-09-20 DIAGNOSIS — N186 End stage renal disease: Secondary | ICD-10-CM | POA: Diagnosis not present

## 2022-09-26 DIAGNOSIS — N186 End stage renal disease: Secondary | ICD-10-CM | POA: Diagnosis not present

## 2022-09-27 DIAGNOSIS — N186 End stage renal disease: Secondary | ICD-10-CM | POA: Diagnosis not present

## 2022-09-30 ENCOUNTER — Encounter: Payer: Self-pay | Admitting: *Deleted

## 2022-09-30 ENCOUNTER — Other Ambulatory Visit: Payer: Medicaid Other | Admitting: *Deleted

## 2022-09-30 NOTE — Patient Instructions (Signed)
Visit Information  [name] was given information about Medicaid Managed Care team care coordination services as a part of their [facility] Medicaid benefit. Latoya Walsh verbally consented to engagement with the [facility] Managed Care team.   If you are experiencing a medical emergency, please call 911 or report to your local emergency department or urgent care.   If you have a non-emergency medical problem during routine business hours, please contact your provider's office and ask to speak with a nurse.   For questions related to your [facility], please call: [phone] or visit the homepage here: [email or web address]  If you would like to schedule transportation through your [facility], please call the following number at least 2 days in advance of your appointment: [phone]   Rides for urgent appointments can also be made after hours by calling Member Services.  Call the Behavioral Health Crisis Line at [phone], at any time, 24 hours a day, 7 days a week. If you are in danger or need immediate medical attention call 911.  If you would like help to quit smoking, call 1-800-QUIT-NOW (531-186-8310) OR Espaol: 1-855-Djelo-Ya (1-324-401-0272) o para ms informacin haga clic aqu or Text READY to 536-644 to register via text  Latoya Walsh,   Please see education materials related to diabetes provided by MyChart link.  Patient verbalizes understanding of instructions and care plan provided today and agrees to view in MyChart. Active MyChart status and patient understanding of how to access instructions and care plan via MyChart confirmed with patient.     Telephone follow up appointment with Managed Medicaid care management team member scheduled for:10/29/22 @ 1:15pm  Estanislado Emms RN, BSN [place]  Managed [facility] RN Care  Coordinator [phone]   Following is a copy of your plan of care:  Care Plan : RN Care Manager Plan of Care  Updates made by Latoya Dach, RN since 09/30/2022 12:00 AM     Problem: Health Management needs related to DMII and HF      Long-Range Goal: Development of Plan of Care to address Health Management needs related to DMII and HF   Start Date: 07/10/2021  Expected End Date: 11/20/2022  Priority: High  Note:   Current Barriers:  Chronic Disease Management support and education needs related to CHF and DMII Latoya Walsh is living with her brother. He helps with managing medications. Latoya Walsh was at dialysis today. Recent A1C improved from >11 to 8.3.  RNCM Clinical Goal(s):  Patient will verbalize understanding of plan for management of CHF and DMII as evidenced by patient verbalization of self monitoring activities take all medications exactly as prescribed and will call provider for medication related questions as evidenced by documentation in EMR    attend all scheduled medical appointments: Dialysis on M/W/F, Endocrinology on 11/06/22 and Eye Exam on 11/19/22 as evidenced by provider documentation          Interventions: Inter-disciplinary care team collaboration (see longitudinal plan of care) Evaluation of current treatment plan related to  self management and patient's adherence to plan as established by provider   Heart Failure Interventions:  (Status: Condition stable. Not addressed this visit.)  Long Term Goal  Wt Readings from Last 3 Encounters:  08/18/22 154 lb 12.8 oz (70.2 kg)  05/19/22 158 lb (71.7 kg)  05/07/22 156 lb (70.8 kg)   Provided education on low sodium diet Discussed the importance of keeping all appointments with provider Assessed  social determinant of health barriers Advised patient to report any concerns or changes to provider Encouraged light exercise as tolerated Reviewed recent provider notes from Cardiology visit and discussed with  Patient/Sister Discussed medication change, Dr. Wyline Mood discontinued Imdur on 05/07/22, advised Patient/Sister to contact pharmacy to make aware and remove from compliance packaging  Diabetes:  (Status: Goal on Track (progressing): YES.) Long Term Goal  Lab Results  Component Value Date   HGBA1C 8.3 (H) 08/18/2022   @ Assessed patient's understanding of A1c goal: <8% Provided education to patient about basic DM disease process; Reviewed prescribed diet with patient diabetic; Counseled on importance of regular laboratory monitoring as prescribed;        Discussed plans with patient for ongoing care management follow up and provided patient with direct contact information for care management team;      Reviewed scheduled/upcoming provider appointments including: 11/06/22 with Endocrinology, 11/19/22 for Eye Exam;         Review of patient status, including review of consultants reports, relevant laboratory and other test results, and medications completed;       Assessed social determinant of health barriers;        Collaborated with [facility] to request rescheduling new patient appointment to a Tuesday or Thursday, due to patient having dialysis on M/W/F Encouragement provided   Patient Goals/Self-Care Activities: Take medications as prescribed   Attend all scheduled provider appointments Call provider office for new concerns or questions  use salt in moderation eat more whole grains, fruits and vegetables, lean meats and healthy fats dress right for the weather, hot or cold Complete requested lab work

## 2022-09-30 NOTE — Patient Outreach (Signed)
Medicaid Managed Care   Nurse Care Manager Note  09/30/2022 Name:  Latoya Walsh MRN:  [id number] DOB:  March 23, 1961  Latoya Walsh is an 62 y.o. year old female who is a primary patient of [name], [name], NP.  [facility] Managed Care Coordination team was consulted for assistance with:    DMII  [name] was given information about Medicaid Managed Care Coordination team services today. [name] Patient agreed to services and verbal consent obtained.  Engaged with patient by telephone for follow up visit in response to provider referral for case management and/or care coordination services.   Assessments/Interventions:  Review of past medical history, allergies, medications, health status, including review of consultants reports, laboratory and other test data, was performed as part of comprehensive evaluation and provision of chronic care management services.  SDOH (Social Determinants of Health) assessments and interventions performed: SDOH Interventions    Flowsheet Row Patient Outreach Telephone from 09/30/2022 in [place] POPULATION HEALTH DEPARTMENT Patient Outreach Telephone from 04/28/2022 in [place] POPULATION HEALTH DEPARTMENT Patient Outreach Telephone from 03/03/2022 in [place] POPULATION HEALTH DEPARTMENT Patient Outreach Telephone from 08/21/2021 in [facility] Care Coordination Patient Outreach Telephone from 07/31/2021 in [facility] Care Coordination Patient Outreach Telephone from 07/10/2021 in [facility] Care Coordination  SDOH Interventions        Food Insecurity Interventions Intervention Not Indicated Intervention Not Indicated -- -- Other (Comment)  [Advised patient to recertify for food benefits] --  Housing Interventions Intervention Not Indicated Intervention Not Indicated Intervention Not Indicated -- -- Intervention Not Indicated  [Patient is currently living with her daughter]   Transportation Interventions -- Payor Benefit Intervention Not Indicated -- Other (Comment)  [Provided patient with UHC transportation [phone] --  Utilities Interventions -- Intervention Not Indicated -- -- -- --  Stress Interventions -- -- -- Provide Counseling, Offered [employer] -- --       Care Plan  Allergies  Allergen Reactions   Penicillins Itching and Rash    Medications Reviewed Today     Reviewed by [name], RN (Registered Nurse) on 09/30/22 at 1450  Med List Status: <None>   Medication Order Taking? Sig Documenting Provider Last Dose Status Informant  albuterol (VENTOLIN HFA) 108 (90 Base) MCG/ACT inhaler [id number] Yes Inhale 2 puffs into the lungs every 4 (four) hours as needed for wheezing or shortness of breath. [name], DO Taking Active Self, Pharmacy Records  amLODipine (NORVASC) 10 MG tablet [id number] Yes Take 10 mg by mouth daily. [provider] Taking Active Self, Pharmacy Records           Med Note Normand Sloop, [name]   Thu Jan 01, 2022 10:10 AM)    COMFORT EZ PEN NEEDLES 32G X 4 MM MISC [id number] Yes USE AS DIRECTED with insulin injections [name], NP Taking Active Self, Pharmacy Records  D3-1000 25 MCG (1000 UT) capsule [id number] No Take 1,000 Units by mouth daily. [provider] Unknown Active   Insulin Pen Needle (PEN NEEDLES) 31G X 5 MM MISC [id number] Yes Use to inject insulin once daily [name], NP Taking Active Self, Pharmacy Records  isosorbide mononitrate (IMDUR) 30 MG 24 hr tablet [id number] No TAKE ONE TABLET BY MOUTH EVERY MORNING  Patient not taking: Reported on 05/19/2022   [name], MD Not Taking Active            Med Note (Tashe Purdon  A   Tue May 12, 2022 11:18 AM) D/C'd by cardiology on 05/07/22  metoprolol succinate (TOPROL-XL) 50 MG 24 hr tablet [id number] No TAKE ONE TABLET BY MOUTH EVERY EVENING Branch, [name], MD Unknown Active   mirtazapine  (REMERON) 15 MG tablet [id number] No Take 1 tablet (15 mg total) by mouth every evening. [name], [name], NP Unknown Active   rosuvastatin (CRESTOR) 10 MG tablet [id number] No Take 1 tablet (10 mg total) by mouth every morning.  Patient not taking: Reported on 08/18/2022   [name], [name], NP Unknown Active   sevelamer carbonate (RENVELA) 800 MG tablet [id number] No Take 800 mg by mouth 3 (three) times daily with meals.  Patient not taking: Reported on 05/19/2022   [provider] Unknown Active Self, Pharmacy Records           Med Note Normand Sloop, Deborra Medina Jan 01, 2022 10:12 AM)    Med List Note [name], [place] 38/75/[birth year] 3329):              Patient Active Problem List   Diagnosis Date Noted   ESRD (end stage renal disease) (HCC) 01/06/2021   CHF (congestive heart failure) (HCC) 06/06/2020   Leg swelling 04/28/2020   Hyperlipidemia LDL goal <100 04/28/2020   Left hand pain 03/10/2020   Anemia 02/19/2020   COVID-19 virus infection 12/26/2019   Depression, major, single episode, in partial remission (HCC) 01/25/2019   Headache 11/13/2018   Insomnia 09/25/2018   Screening for colorectal cancer 07/23/2016   Lumbar back pain with radiculopathy affecting right lower extremity 03/27/2015   Non compliance with medical treatment 07/22/2014   Chronic hepatitis C without hepatic coma (HCC) 12/04/2013   Allergic rhinitis 11/03/2012   Anxiety and depression 02/05/2011   Nicotine dependence 11/01/2010   Dermatitis 10/30/2010   Drug dependence, continuous abuse (HCC) 03/22/2010   Uncontrolled type 2 diabetes mellitus with hyperglycemia (HCC) 06/02/2007   Alcohol abuse 06/02/2007    Conditions to be addressed/monitored per PCP order:  DMII  Care Plan : RN Care Manager Plan of Care  Updates made by [name], RN since 09/30/2022 12:00 AM     Problem: Health Management needs related to DMII and HF      Long-Range Goal: Development of Plan of Care to address  Health Management needs related to DMII and HF   Start Date: 07/10/2021  Expected End Date: 11/20/2022  Priority: High  Note:   Current Barriers:  Chronic Disease Management support and education needs related to CHF and DMII [name] is living with her brother. He helps with managing medications. Ms. Baum was at dialysis today. Recent A1C improved from >11 to 8.3.  RNCM Clinical Goal(s):  Patient will verbalize understanding of plan for management of CHF and DMII as evidenced by patient verbalization of self monitoring activities take all medications exactly as prescribed and will call provider for medication related questions as evidenced by documentation in EMR    attend all scheduled medical appointments: Dialysis on M/W/F, Endocrinology on 11/06/22 and Eye Exam on 11/19/22 as evidenced by provider documentation          Interventions: Inter-disciplinary care team collaboration (see longitudinal plan of care) Evaluation of current treatment plan related to  self management and patient's adherence to plan as established by provider   Heart Failure Interventions:  (Status: Condition stable. Not addressed this visit.)  Long Term Goal  Wt Readings from Last 3 Encounters:  08/18/22 154  lb 12.8 oz (70.2 kg)  05/19/22 158 lb (71.7 kg)  05/07/22 156 lb (70.8 kg)   Provided education on low sodium diet Discussed the importance of keeping all appointments with provider Assessed social determinant of health barriers Advised patient to report any concerns or changes to provider Encouraged light exercise as tolerated Reviewed recent provider notes from Cardiology visit and discussed with Patient/Sister Discussed medication change, Dr. Wyline Mood discontinued Imdur on 05/07/22, advised Patient/Sister to contact pharmacy to make aware and remove from compliance packaging  Diabetes:  (Status: Goal on Track (progressing): YES.) Long Term Goal  Lab Results  Component Value Date   HGBA1C 8.3 (H)  08/18/2022   @ Assessed patient's understanding of A1c goal: <8% Provided education to patient about basic DM disease process; Reviewed prescribed diet with patient diabetic; Counseled on importance of regular laboratory monitoring as prescribed;        Discussed plans with patient for ongoing care management follow up and provided patient with direct contact information for care management team;      Reviewed scheduled/upcoming provider appointments including: 11/06/22 with Endocrinology, 11/19/22 for Eye Exam;         Review of patient status, including review of consultants reports, relevant laboratory and other test results, and medications completed;       Assessed social determinant of health barriers;        Collaborated with  Endocrinology to request rescheduling new patient appointment to a Tuesday or Thursday, due to patient having dialysis on M/W/F Encouragement provided   Patient Goals/Self-Care Activities: Take medications as prescribed   Attend all scheduled provider appointments Call provider office for new concerns or questions  use salt in moderation eat more whole grains, fruits and vegetables, lean meats and healthy fats dress right for the weather, hot or cold Complete requested lab work       Follow Up:  Patient agrees to Care Plan and Follow-up.  Plan: The Managed Medicaid care management team will reach out to the patient again over the next 30 days.  Date/time of next scheduled RN care management/care coordination outreach:  10/29/22 @ 1:15pm  Estanislado Emms RN, BSN [place]  Managed [facility] RN Care Coordinator [phone]

## 2022-10-01 ENCOUNTER — Other Ambulatory Visit: Payer: Self-pay | Admitting: Family

## 2022-10-01 DIAGNOSIS — E1165 Type 2 diabetes mellitus with hyperglycemia: Secondary | ICD-10-CM

## 2022-10-03 DIAGNOSIS — N186 End stage renal disease: Secondary | ICD-10-CM | POA: Diagnosis not present

## 2022-10-04 DIAGNOSIS — N186 End stage renal disease: Secondary | ICD-10-CM | POA: Diagnosis not present

## 2022-10-10 DIAGNOSIS — N186 End stage renal disease: Secondary | ICD-10-CM | POA: Diagnosis not present

## 2022-10-11 DIAGNOSIS — N186 End stage renal disease: Secondary | ICD-10-CM | POA: Diagnosis not present

## 2022-10-17 DIAGNOSIS — N186 End stage renal disease: Secondary | ICD-10-CM | POA: Diagnosis not present

## 2022-10-18 DIAGNOSIS — N186 End stage renal disease: Secondary | ICD-10-CM | POA: Diagnosis not present

## 2022-10-21 DIAGNOSIS — N186 End stage renal disease: Secondary | ICD-10-CM | POA: Diagnosis not present

## 2022-10-21 DIAGNOSIS — Z992 Dependence on renal dialysis: Secondary | ICD-10-CM | POA: Diagnosis not present

## 2022-10-21 DIAGNOSIS — E1122 Type 2 diabetes mellitus with diabetic chronic kidney disease: Secondary | ICD-10-CM | POA: Diagnosis not present

## 2022-10-24 DIAGNOSIS — N186 End stage renal disease: Secondary | ICD-10-CM | POA: Diagnosis not present

## 2022-10-25 DIAGNOSIS — N186 End stage renal disease: Secondary | ICD-10-CM | POA: Diagnosis not present

## 2022-10-29 ENCOUNTER — Encounter: Payer: Self-pay | Admitting: *Deleted

## 2022-10-29 ENCOUNTER — Other Ambulatory Visit: Payer: Medicaid Other | Admitting: *Deleted

## 2022-10-29 NOTE — Patient Instructions (Signed)
Visit Information  Latoya Walsh was given information about Medicaid Managed Care team care coordination services as a part of their [facility] Community Plan Medicaid benefit. Latoya Walsh verbally consented to engagement with the [facility] Managed Care team.   If you are experiencing a medical emergency, please call 911 or report to your local emergency department or urgent care.   If you have a non-emergency medical problem during routine business hours, please contact your provider's office and ask to speak with a nurse.   For questions related to your [facility], please call: [phone] or visit the homepage here: [email or web address]  If you would like to schedule transportation through your [facility], please call the following number at least 2 days in advance of your appointment: [phone]   Rides for urgent appointments can also be made after hours by calling Member Services.  Call the Behavioral Health Crisis Line at [phone], at any time, 24 hours a day, 7 days a week. If you are in danger or need immediate medical attention call 911.  If you would like help to quit smoking, call 1-800-QUIT-NOW ((339) 428-7360) OR Espaol: 1-855-Djelo-Ya (9-518-841-6606) o para ms informacin haga clic aqu or Text READY to 301-601 to register via text  Latoya Walsh,   Please see education materials related to diabetes provided as print materials.   The patient verbalized understanding of instructions, educational materials, and care plan provided today and agreed to receive a mailed copy of patient instructions, educational materials, and care plan.   Telephone follow up appointment with Managed Medicaid care management team member scheduled for:9/10/ @ 1:15pm  Estanislado Emms RN, BSN [place]  Managed [facility] RN Care Coordinator [phone]   Following is a copy  of your plan of care:  Care Plan : RN Care Manager Plan of Care  Updates made by [name], RN since 10/29/2022 12:00 AM     Problem: Health Management needs related to DMII and HF      Long-Range Goal: Development of Plan of Care to address Health Management needs related to DMII and HF   Start Date: 07/10/2021  Expected End Date: 11/20/2022  Priority: High  Note:   Current Barriers:  Chronic Disease Management support and education needs related to CHF and DMII Latoya Walsh attended a transplant education class at [facility] today. She   RNCM Clinical Goal(s):  Patient will verbalize understanding of plan for management of CHF and DMII as evidenced by patient verbalization of self monitoring activities take all medications exactly as prescribed and will call provider for medication related questions as evidenced by documentation in EMR    attend all scheduled medical appointments: Dialysis on M/W/F, Endocrinology on 11/06/22 and Eye Exam on 11/19/22 as evidenced by provider documentation          Interventions: Inter-disciplinary care team collaboration (see longitudinal plan of care) Evaluation of current treatment plan related to  self management and patient's adherence to plan as established by provider Provided therapeutic listening   Heart Failure Interventions:  (Status: Goal Met.)  Long Term Goal  Wt Readings from Last 3 Encounters:  08/18/22 154 lb 12.8 oz (70.2 kg)  05/19/22 158 lb (71.7 kg)  05/07/22 156 lb (70.8 kg)    Provided education on low sodium diet Discussed the importance of keeping all appointments with provider Assessed social determinant of health barriers Advised patient to report any concerns or changes to provider Encouraged light exercise as tolerated Reviewed  recent provider notes from Cardiology visit and discussed with Patient/Sister Discussed medication change, Dr. Wyline Mood discontinued Imdur on 05/07/22, advised Patient/Sister to contact pharmacy to make  aware and remove from compliance packaging  Diabetes:  (Status: Goal on Track (progressing): YES.) Long Term Goal  Lab Results  Component Value Date   HGBA1C 8.3 (H) 08/18/2022   @ Assessed patient's understanding of A1c goal: <8% Provided education to patient about basic DM disease process; Reviewed prescribed diet with patient diabetic; Counseled on importance of regular laboratory monitoring as prescribed;        Discussed plans with patient for ongoing care management follow up and provided patient with direct contact information for care management team;      Reviewed scheduled/upcoming provider appointments including: 11/06/22 with Endocrinology, 11/19/22 for Eye Exam;         Review of patient status, including review of consultants reports, relevant laboratory and other test results, and medications completed;       Assessed social determinant of health barriers;        Collaborated with [facility] to request rescheduling new patient appointment to a Tuesday or Thursday, due to patient having dialysis on M/W/F-resent message to request new appointment Encouragement provided Discussed the importance of exercise and including protein with meals in managing diabetes   Patient Goals/Self-Care Activities: Take medications as prescribed   Attend all scheduled provider appointments Call provider office for new concerns or questions  use salt in moderation eat more whole grains, fruits and vegetables, lean meats and healthy fats dress right for the weather, hot or cold Complete requested lab work

## 2022-10-29 NOTE — Patient Outreach (Signed)
Medicaid Managed Care   Nurse Care Manager Note  10/29/2022 Name:  Latoya Walsh MRN:  [id number] DOB:  07-06-1960  Latoya Walsh is an 62 y.o. year old female who is a primary patient of [name], [name], NP.  The [facility] Managed Care Coordination team was consulted for assistance with:    DMII  [name] was given information about Medicaid Managed Care Coordination team services today. Latoya Walsh Patient agreed to services and verbal consent obtained.  Engaged with patient by telephone for follow up visit in response to provider referral for case management and/or care coordination services.   Assessments/Interventions:  Review of past medical history, allergies, medications, health status, including review of consultants reports, laboratory and other test data, was performed as part of comprehensive evaluation and provision of chronic care management services.  SDOH (Social Determinants of Health) assessments and interventions performed: SDOH Interventions    Flowsheet Row Patient Outreach Telephone from 09/30/2022 in [place] POPULATION HEALTH DEPARTMENT Patient Outreach Telephone from 04/28/2022 in [place] POPULATION HEALTH DEPARTMENT Patient Outreach Telephone from 03/03/2022 in [place] POPULATION HEALTH DEPARTMENT Patient Outreach Telephone from 08/21/2021 in [facility] Care Coordination Patient Outreach Telephone from 07/31/2021 in [facility] Care Coordination Patient Outreach Telephone from 07/10/2021 in [facility] Care Coordination  SDOH Interventions        Food Insecurity Interventions Intervention Not Indicated Intervention Not Indicated -- -- Other (Comment)  [Advised patient to recertify for food benefits] --  Housing Interventions Intervention Not Indicated Intervention Not Indicated Intervention Not Indicated -- -- Intervention Not Indicated  [Patient is currently living with her daughter]   Transportation Interventions -- Payor Benefit Intervention Not Indicated -- Other (Comment)  [Provided patient with UHC transportation [phone] --  Utilities Interventions -- Intervention Not Indicated -- -- -- --  Stress Interventions -- -- -- Provide Counseling, Offered [employer] -- --       Care Plan  Allergies  Allergen Reactions   Penicillins Itching and Rash    Medications Reviewed Today     Reviewed by [name], RN (Registered Nurse) on 10/29/22 at 1334  Med List Status: <None>   Medication Order Taking? Sig Documenting Provider Last Dose Status Informant  albuterol (VENTOLIN HFA) 108 (90 Base) MCG/ACT inhaler [id number] Yes Inhale 2 puffs into the lungs every 4 (four) hours as needed for wheezing or shortness of breath. [name], DO Taking Active Self, Pharmacy Records  amLODipine (NORVASC) 10 MG tablet [id number] Yes Take 10 mg by mouth daily. [provider] Taking Active Self, Pharmacy Records           Med Note Normand Sloop, [name]   Thu Jan 01, 2022 10:10 AM)    COMFORT EZ PEN NEEDLES 32G X 4 MM MISC [id number] Yes USE AS DIRECTED with insulin injections [name], NP Taking Active Self, Pharmacy Records  D3-1000 25 MCG (1000 UT) capsule [id number] Yes Take 1,000 Units by mouth daily. [provider] Taking Active   Insulin Pen Needle (PEN NEEDLES) 31G X 5 MM MISC [id number]  Use to inject insulin once daily [name], NP  Active Self, Pharmacy Records  isosorbide mononitrate (IMDUR) 30 MG 24 hr tablet [id number] No TAKE ONE TABLET BY MOUTH EVERY MORNING  Patient not taking: Reported on 05/19/2022   [name], MD Not Taking Active            Med Note (,   A   Tue May 12, 2022 11:18 AM) D/C'd by cardiology on 05/07/22  LANTUS SOLOSTAR 100 UNIT/ML Solostar Pen [id number] Yes INJECT 35 UNITS SUBCUTANEOUSLY AT BEDTIME [name], [name], NP Taking Active   metoprolol succinate (TOPROL-XL) 50  MG 24 hr tablet [id number] Yes TAKE ONE TABLET BY MOUTH EVERY EVENING Branch, [name], MD Taking Active   mirtazapine (REMERON) 15 MG tablet [id number] Yes Take 1 tablet (15 mg total) by mouth every evening. [name], [name], NP Taking Active   rosuvastatin (CRESTOR) 10 MG tablet [id number] No Take 1 tablet (10 mg total) by mouth every morning.  Patient not taking: Reported on 08/18/2022   [name], Donalee Citrin, NP Not Taking Active   sevelamer carbonate (RENVELA) 800 MG tablet [id number] No Take 800 mg by mouth 3 (three) times daily with meals.  Patient not taking: Reported on 05/19/2022   [provider] Not Taking Active Self, Pharmacy Records           Med Note [name] Jan 01, 2022 10:12 AM)    Med List Note [name], [place] 55/73/22 0254):              Patient Active Problem List   Diagnosis Date Noted   ESRD (end stage renal disease) (HCC) 01/06/2021   CHF (congestive heart failure) (HCC) 06/06/2020   Leg swelling 04/28/2020   Hyperlipidemia LDL goal <100 04/28/2020   Left hand pain 03/10/2020   Anemia 02/19/2020   COVID-19 virus infection 12/26/2019   Depression, major, single episode, in partial remission (HCC) 01/25/2019   Headache 11/13/2018   Insomnia 09/25/2018   Screening for colorectal cancer 07/23/2016   Lumbar back pain with radiculopathy affecting right lower extremity 03/27/2015   Non compliance with medical treatment 07/22/2014   Chronic hepatitis C without hepatic coma (HCC) 12/04/2013   Allergic rhinitis 11/03/2012   Anxiety and depression 02/05/2011   Nicotine dependence 11/01/2010   Dermatitis 10/30/2010   Drug dependence, continuous abuse (HCC) 03/22/2010   Uncontrolled type 2 diabetes mellitus with hyperglycemia (HCC) 06/02/2007   Alcohol abuse 06/02/2007    Conditions to be addressed/monitored per PCP order:  DMII  Care Plan : RN Care Manager Plan of Care  Updates made by [name], RN since 10/29/2022 12:00 AM      Problem: Health Management needs related to DMII and HF      Long-Range Goal: Development of Plan of Care to address Health Management needs related to DMII and HF   Start Date: 07/10/2021  Expected End Date: 11/20/2022  Priority: High  Note:   Current Barriers:  Chronic Disease Management support and education needs related to CHF and DMII Ms. Condrey attended a transplant education class at [facility] today. She   RNCM Clinical Goal(s):  Patient will verbalize understanding of plan for management of CHF and DMII as evidenced by patient verbalization of self monitoring activities take all medications exactly as prescribed and will call provider for medication related questions as evidenced by documentation in EMR    attend all scheduled medical appointments: Dialysis on M/W/F, Endocrinology on 11/06/22 and Eye Exam on 11/19/22 as evidenced by provider documentation          Interventions: Inter-disciplinary care team collaboration (see longitudinal plan of care) Evaluation of current treatment plan related to  self management and patient's adherence to plan as established by provider Provided therapeutic listening   Heart Failure Interventions:  (Status: Goal Met.)  Long Term Goal  Wt Readings from Last 3 Encounters:  08/18/22 154 lb 12.8 oz (70.2 kg)  05/19/22 158 lb (71.7 kg)  05/07/22 156 lb (70.8 kg)    Provided education on low sodium diet Discussed the importance of keeping all appointments with provider Assessed social determinant of health barriers Advised patient to report any concerns or changes to provider Encouraged light exercise as tolerated Reviewed recent provider notes from Cardiology visit and discussed with Patient/Sister Discussed medication change, Dr. Wyline Mood discontinued Imdur on 05/07/22, advised Patient/Sister to contact pharmacy to make aware and remove from compliance packaging  Diabetes:  (Status: Goal on Track (progressing): YES.) Long Term Goal  Lab Results   Component Value Date   HGBA1C 8.3 (H) 08/18/2022   @ Assessed patient's understanding of A1c goal: <8% Provided education to patient about basic DM disease process; Reviewed prescribed diet with patient diabetic; Counseled on importance of regular laboratory monitoring as prescribed;        Discussed plans with patient for ongoing care management follow up and provided patient with direct contact information for care management team;      Reviewed scheduled/upcoming provider appointments including: 11/06/22 with Endocrinology, 11/19/22 for Eye Exam;         Review of patient status, including review of consultants reports, relevant laboratory and other test results, and medications completed;       Assessed social determinant of health barriers;        Collaborated with [facility] to request rescheduling new patient appointment to a Tuesday or Thursday, due to patient having dialysis on M/W/F-resent message to request new appointment Encouragement provided Discussed the importance of exercise and including protein with meals in managing diabetes   Patient Goals/Self-Care Activities: Take medications as prescribed   Attend all scheduled provider appointments Call provider office for new concerns or questions  use salt in moderation eat more whole grains, fruits and vegetables, lean meats and healthy fats dress right for the weather, hot or cold Complete requested lab work       Follow Up:  Patient agrees to Care Plan and Follow-up.  Plan: The Managed Medicaid care management team will reach out to the patient again over the next 30 days.  Date/time of next scheduled RN care management/care coordination outreach:  12/01/22 @ 1:15pm  Estanislado Emms RN, BSN [place]  Managed [facility] RN Care Coordinator [phone]

## 2022-10-31 DIAGNOSIS — N186 End stage renal disease: Secondary | ICD-10-CM | POA: Diagnosis not present

## 2022-11-01 DIAGNOSIS — N186 End stage renal disease: Secondary | ICD-10-CM | POA: Diagnosis not present

## 2022-11-03 ENCOUNTER — Ambulatory Visit: Payer: Medicaid Other | Admitting: Family

## 2022-11-06 ENCOUNTER — Ambulatory Visit: Payer: Medicaid Other | Admitting: Internal Medicine

## 2022-11-07 DIAGNOSIS — N186 End stage renal disease: Secondary | ICD-10-CM | POA: Diagnosis not present

## 2022-11-08 DIAGNOSIS — N186 End stage renal disease: Secondary | ICD-10-CM | POA: Diagnosis not present

## 2022-11-14 DIAGNOSIS — N186 End stage renal disease: Secondary | ICD-10-CM | POA: Diagnosis not present

## 2022-11-15 DIAGNOSIS — N186 End stage renal disease: Secondary | ICD-10-CM | POA: Diagnosis not present

## 2022-11-17 ENCOUNTER — Other Ambulatory Visit: Payer: Self-pay | Admitting: Family

## 2022-11-17 DIAGNOSIS — F5101 Primary insomnia: Secondary | ICD-10-CM

## 2022-11-20 ENCOUNTER — Other Ambulatory Visit: Payer: Self-pay | Admitting: Family

## 2022-11-20 DIAGNOSIS — E1165 Type 2 diabetes mellitus with hyperglycemia: Secondary | ICD-10-CM

## 2022-11-21 ENCOUNTER — Other Ambulatory Visit: Payer: Self-pay | Admitting: Family

## 2022-11-21 DIAGNOSIS — E1122 Type 2 diabetes mellitus with diabetic chronic kidney disease: Secondary | ICD-10-CM | POA: Diagnosis not present

## 2022-11-21 DIAGNOSIS — N186 End stage renal disease: Secondary | ICD-10-CM | POA: Diagnosis not present

## 2022-11-21 DIAGNOSIS — E1165 Type 2 diabetes mellitus with hyperglycemia: Secondary | ICD-10-CM

## 2022-11-21 DIAGNOSIS — Z992 Dependence on renal dialysis: Secondary | ICD-10-CM | POA: Diagnosis not present

## 2022-11-22 DIAGNOSIS — N186 End stage renal disease: Secondary | ICD-10-CM | POA: Diagnosis not present

## 2022-11-24 NOTE — Telephone Encounter (Signed)
Not on medication list.

## 2022-11-28 DIAGNOSIS — N186 End stage renal disease: Secondary | ICD-10-CM | POA: Diagnosis not present

## 2022-11-29 DIAGNOSIS — N186 End stage renal disease: Secondary | ICD-10-CM | POA: Diagnosis not present

## 2022-12-01 ENCOUNTER — Other Ambulatory Visit: Payer: Medicaid Other | Admitting: *Deleted

## 2022-12-01 NOTE — Patient Outreach (Signed)
  Medicaid Managed Care   Unsuccessful Attempt Note   12/01/2022 Name: Latoya Walsh MRN: [id number] DOB: 1960/09/29  Referred by: Caesar Bookman, NP Reason for referral : High Risk Managed Medicaid (Unsuccessful RNCM follow up telephone outreach)   An unsuccessful telephone outreach was attempted today. The patient was referred to the case management team for assistance with care management and care coordination.    Follow Up Plan: The Managed Medicaid care management team will reach out to the patient again over the next 7 days.    Estanislado Emms RN, BSN [place]  Value-Based Care [facility] RN Care Coordinator [phone]

## 2022-12-01 NOTE — Patient Instructions (Signed)
Visit Information  Ms. [name]  - as a part of your Medicaid benefit, you are eligible for care management and care coordination services at no cost or copay. I was unable to reach you by phone today but would be happy to help you with your health related needs. Please feel free to call me @ [phone].   A member of the Managed Medicaid care management team will reach out to you again over the next 7 days.   Estanislado Emms RN, BSN [place]  Value-Based Care Institute [facility] RN Care Coordinator [phone]

## 2022-12-05 DIAGNOSIS — N186 End stage renal disease: Secondary | ICD-10-CM | POA: Diagnosis not present

## 2022-12-06 DIAGNOSIS — N186 End stage renal disease: Secondary | ICD-10-CM | POA: Diagnosis not present

## 2022-12-10 ENCOUNTER — Ambulatory Visit (INDEPENDENT_AMBULATORY_CARE_PROVIDER_SITE_OTHER): Payer: Medicaid Other | Admitting: Podiatry

## 2022-12-10 DIAGNOSIS — E1165 Type 2 diabetes mellitus with hyperglycemia: Secondary | ICD-10-CM

## 2022-12-10 DIAGNOSIS — M79675 Pain in left toe(s): Secondary | ICD-10-CM

## 2022-12-10 DIAGNOSIS — M79674 Pain in right toe(s): Secondary | ICD-10-CM | POA: Diagnosis not present

## 2022-12-10 DIAGNOSIS — B351 Tinea unguium: Secondary | ICD-10-CM

## 2022-12-10 NOTE — Progress Notes (Signed)
Subjective:  Patient ID: Latoya Walsh, female    DOB: 03/16/1961,  MRN: [id number]  Chief Complaint  Patient presents with   Nail Problem    Diabetic Foot Care-nail trim     62 y.o. female presents with the above complaint. History confirmed with patient.  Patient presents for diabetic foot care and nail trim.  She had history of type 2 diabetes with neuropathy.  Unable to trim her nails herself.  They do cause her pain due to the length and thickness.  Objective:  Physical Exam: warm, good capillary refill, nail exam onychomycosis of the toenails, onycholysis, and dystrophic nails, no trophic changes or ulcerative lesions. DP pulses palpable, PT pulses palpable, and protective sensation absent Left Foot: normal exam, no swelling, tenderness, instability; ligaments intact, full range of motion of all ankle/foot joints  Right Foot: normal exam, no swelling, tenderness, instability; ligaments intact, full range of motion of all ankle/foot joints   No images are attached to the encounter.  Assessment:   1. Pain due to onychomycosis of toenails of both feet   2. Uncontrolled type 2 diabetes mellitus with hyperglycemia (HCC)        Plan:  Patient was evaluated and treated and all questions answered.  Onychomycosis with pain  -Nails palliatively debrided as below. -Educated on self-care  Procedure: Nail Debridement Rationale: Pain Type of Debridement: manual, sharp debridement. Instrumentation: Nail nipper, rotary burr. Number of Nails: 10  Return in about 3 months (around 03/11/2023) for [facility].         Corinna Gab, DPM [facility]

## 2022-12-12 DIAGNOSIS — N186 End stage renal disease: Secondary | ICD-10-CM | POA: Diagnosis not present

## 2022-12-13 DIAGNOSIS — N186 End stage renal disease: Secondary | ICD-10-CM | POA: Diagnosis not present

## 2022-12-19 DIAGNOSIS — N186 End stage renal disease: Secondary | ICD-10-CM | POA: Diagnosis not present

## 2022-12-21 DIAGNOSIS — N186 End stage renal disease: Secondary | ICD-10-CM | POA: Diagnosis not present

## 2022-12-21 DIAGNOSIS — E1122 Type 2 diabetes mellitus with diabetic chronic kidney disease: Secondary | ICD-10-CM | POA: Diagnosis not present

## 2022-12-21 DIAGNOSIS — Z992 Dependence on renal dialysis: Secondary | ICD-10-CM | POA: Diagnosis not present

## 2022-12-22 ENCOUNTER — Other Ambulatory Visit: Payer: Medicaid Other | Admitting: *Deleted

## 2022-12-22 NOTE — Patient Outreach (Signed)
Care Coordination  12/22/2022  [name] 1961-01-08 [id number]   Successful outreach with [name] today. However, she was not feeling well and request to reschedule this telephone appointment. A new appointment was scheduled on 12/30/22 at 1:15pm. [name] agreed to new date and time.  Estanislado Emms RN, BSN [place]  Value-Based Care Institute [facility] RN Care Coordinator [phone]

## 2022-12-25 ENCOUNTER — Other Ambulatory Visit: Payer: Self-pay | Admitting: Family

## 2022-12-25 DIAGNOSIS — Z1212 Encounter for screening for malignant neoplasm of rectum: Secondary | ICD-10-CM

## 2022-12-25 DIAGNOSIS — Z1211 Encounter for screening for malignant neoplasm of colon: Secondary | ICD-10-CM

## 2022-12-26 DIAGNOSIS — N186 End stage renal disease: Secondary | ICD-10-CM | POA: Diagnosis not present

## 2022-12-27 DIAGNOSIS — N186 End stage renal disease: Secondary | ICD-10-CM | POA: Diagnosis not present

## 2022-12-30 ENCOUNTER — Other Ambulatory Visit: Payer: Medicaid Other | Admitting: *Deleted

## 2022-12-30 NOTE — Patient Instructions (Signed)
Visit Information  [name] was given information about Medicaid Managed Care team care coordination services as a part of their [facility] Community Plan Medicaid benefit. Latoya Walsh verbally consented to engagement with the [facility] Managed Care team.   If you are experiencing a medical emergency, please call 911 or report to your local emergency department or urgent care.   If you have a non-emergency medical problem during routine business hours, please contact your provider's office and ask to speak with a nurse.   For questions related to your [facility], please call: [phone] or visit the homepage here: [email or web address]  If you would like to schedule transportation through your [facility], please call the following number at least 2 days in advance of your appointment: [phone]   Rides for urgent appointments can also be made after hours by calling Member Services.  Call the Behavioral Health Crisis Line at [phone], at any time, 24 hours a day, 7 days a week. If you are in danger or need immediate medical attention call 911.  If you would like help to quit smoking, call 1-800-QUIT-NOW ((401)399-1065) OR Espaol: 1-855-Djelo-Ya (2-025-427-0623) o para ms informacin haga clic aqu or Text READY to 762-831 to register via text  Latoya Walsh,   Please see education materials related to diabetes provided as print materials.   The patient verbalized understanding of instructions, educational materials, and care plan provided today and agreed to receive a mailed copy of patient instructions, educational materials, and care plan.   Telephone follow up appointment with Managed Medicaid care management team member scheduled for:01/28/23 at 9am  Latoya Emms RN, BSN [place]  Value-Based Care Institute [facility] RN Care  Coordinator [phone]   Following is a copy of your plan of care:  Care Plan : RN Care Manager Plan of Care  Updates made by Latoya Dach, RN since 12/30/2022 12:00 AM     Problem: Health Management needs related to DMII and HF      Long-Range Goal: Development of Plan of Care to address Health Management needs related to DMII and HF   Start Date: 07/10/2021  Expected End Date: 01/28/2023  Priority: High  Note:   Current Barriers:  Chronic Disease Management support and education needs related to CHF and DMII Latoya Walsh has HD on M/W/F. She reports feeling better. She is in a program that she enjoys because it gives her something to do. She is aware of upcoming appointments and reports having transportation.   RNCM Clinical Goal(s):  Patient will verbalize understanding of plan for management of CHF and DMII as evidenced by patient verbalization of self monitoring activities take all medications exactly as prescribed and will call provider for medication related questions as evidenced by documentation in EMR    attend all scheduled medical appointments: Dialysis on M/W/F, PCP 12/31/22 and Endocrinology on 01/07/23  as evidenced by provider documentation          Interventions: Inter-disciplinary care team collaboration (see longitudinal plan of care) Evaluation of current treatment plan related to  self management and patient's adherence to plan as established by provider Provided therapeutic listening   Diabetes:  (Status: Goal on Track (progressing): YES.) Long Term Goal  Lab Results  Component Value Date   HGBA1C 8.3 (H) 08/18/2022   @ Assessed patient's understanding of A1c goal: <8% Provided education to patient about basic DM disease process; Reviewed prescribed diet with patient diabetic; Counseled on importance  of regular laboratory monitoring as prescribed;        Discussed plans with patient for ongoing care management follow up and provided patient with direct  contact information for care management team;      Reviewed scheduled/upcoming provider appointments including: 12/31/22 with PCP and 01/07/23 with Endocrinology;         Review of patient status, including review of consultants reports, relevant laboratory and other test results, and medications completed;       Assessed social determinant of health barriers;        Discussed the importance of attending upcoming Endocrinology appointment on 01/07/23-patient agreed Encouragement provided Reviewed medications, advised patient to request albuterol refill   Patient Goals/Self-Care Activities: Take medications as prescribed   Attend all scheduled provider appointments Call provider office for new concerns or questions  use salt in moderation eat more whole grains, fruits and vegetables, lean meats and healthy fats dress right for the weather, hot or cold Complete requested lab work

## 2022-12-30 NOTE — Patient Outreach (Signed)
Medicaid Managed Care   Nurse Care Manager Note  12/30/2022 Name:  Latoya Walsh MRN:  [id number] DOB:  03/19/1961  Latoya Walsh is an 62 y.o. year old female who is a primary patient of [name], [name], NP.  The [facility] Managed Care Coordination team was consulted for assistance with:    DMII  [name] was given information about Medicaid Managed Care Coordination team services today. [name] Patient agreed to services and verbal consent obtained.  Engaged with patient by telephone for follow up visit in response to provider referral for case management and/or care coordination services.   Assessments/Interventions:  Review of past medical history, allergies, medications, health status, including review of consultants reports, laboratory and other test data, was performed as part of comprehensive evaluation and provision of chronic care management services.  SDOH (Social Determinants of Health) assessments and interventions performed: SDOH Interventions    Flowsheet Row Patient Outreach Telephone from 12/30/2022 in [place] POPULATION HEALTH DEPARTMENT Patient Outreach Telephone from 09/30/2022 in [place] POPULATION HEALTH DEPARTMENT Patient Outreach Telephone from 04/28/2022 in [place] POPULATION HEALTH DEPARTMENT Patient Outreach Telephone from 03/03/2022 in [place] POPULATION HEALTH DEPARTMENT Patient Outreach Telephone from 08/21/2021 in [facility] Care Coordination Patient Outreach Telephone from 07/31/2021 in [facility] Care Coordination  SDOH Interventions        Food Insecurity Interventions Intervention Not Indicated Intervention Not Indicated Intervention Not Indicated -- -- Other (Comment)  [Advised patient to recertify for food benefits]  Housing Interventions -- Intervention Not Indicated Intervention Not Indicated Intervention Not Indicated -- --  Transportation Interventions Payor Benefit -- Payor  Benefit Intervention Not Indicated -- Other (Comment)  [Provided patient with UHC transportation [phone]  Utilities Interventions -- -- Intervention Not Indicated -- -- --  Stress Interventions -- -- -- -- Provide Counseling, Offered [employer] Resources --       Care Plan  Allergies  Allergen Reactions   Penicillins Itching and Rash    Medications Reviewed Today     Reviewed by [name], RN (Registered Nurse) on 12/30/22 at 1259  Med List Status: <None>   Medication Order Taking? Sig Documenting Provider Last Dose Status Informant  Accu-Chek Softclix Lancets lancets [id number] Yes USE TO TEST BLOOD SUGAR TWICE DAILY AS DIRECTED *REFILL REQUEST* [name], [name], NP Taking Active   albuterol (VENTOLIN HFA) 108 (90 Base) MCG/ACT inhaler [id number] No Inhale 2 puffs into the lungs every 4 (four) hours as needed for wheezing or shortness of breath.  Patient not taking: Reported on 12/30/2022   [name], DO Not Taking Active Self, Pharmacy Records  amLODipine (NORVASC) 10 MG tablet [id number] Yes Take 10 mg by mouth daily. [provider] Taking Active Self, Pharmacy Records           Med Note Normand Sloop, [name]   Thu Jan 01, 2022 10:10 AM)    Blood Glucose Monitoring Suppl (ONETOUCH VERIO FLEX SYSTEM) w/Device Andria Rhein [id number] Yes USE TO TEST BLOOD SUGAR TWICE DAILY AS DIRECTED *REFILL REQUEST* [name], [name], NP Taking Active   COMFORT EZ PEN NEEDLES 32G X 4 MM MISC [id number]  USE AS DIRECTED with insulin injections [name], NP  Active Self, Pharmacy Records  D3-1000 25 MCG (1000 UT) capsule [id number] Yes Take 1,000 Units by mouth daily. [provider] Taking Active   insulin glargine (LANTUS SOLOSTAR) 100 UNIT/ML Solostar Pen [id number] Yes Inject 35 units into THE SKIN AT bedtime  [name], [name], NP Taking Active   Insulin Pen Needle (PEN NEEDLES) 31G X 5 MM MISC [id number] Yes Use to inject insulin once daily [name], NP  Taking Active Self, Pharmacy Records  isosorbide mononitrate (IMDUR) 30 MG 24 hr tablet [id number] No TAKE ONE TABLET BY MOUTH EVERY MORNING  Patient not taking: Reported on 05/19/2022   [name], MD Not Taking Active            Med Note Ardelia Mems, Dontrell Stuck A   Tue May 12, 2022 11:18 AM) D/Walsh'd by cardiology on 05/07/22  metoprolol succinate (TOPROL-XL) 50 MG 24 hr tablet [id number] Yes TAKE ONE TABLET BY MOUTH EVERY Ulice Bold, MD Taking Active   mirtazapine (REMERON) 15 MG tablet [id number] Yes TAKE ONE TABLET BY MOUTH EVERY EVENING [name], [name], NP Taking Active   rosuvastatin (CRESTOR) 10 MG tablet [id number] Yes Take 1 tablet (10 mg total) by mouth every morning. [name], [name], NP Taking Active   sevelamer carbonate (RENVELA) 800 MG tablet [id number] Yes Take 800 mg by mouth 3 (three) times daily with meals. 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/25/2019   Headache 11/13/2018   Insomnia 09/25/2018   Screening for colorectal cancer 07/23/2016   Lumbar back pain with radiculopathy affecting right lower extremity 03/27/2015   Non compliance with medical treatment 07/22/2014   Chronic hepatitis Walsh without hepatic coma (HCC) 12/04/2013   Allergic rhinitis 11/03/2012   Anxiety and depression 02/05/2011   Nicotine dependence 11/01/2010   Dermatitis 10/30/2010   Drug dependence, continuous abuse (HCC) 03/22/2010   Uncontrolled type 2  diabetes mellitus with hyperglycemia (HCC) 06/02/2007   Alcohol abuse 06/02/2007    Conditions to be addressed/monitored per PCP order:  DMII  Care Plan : RN Care Manager Plan of Care  Updates made by [name], RN since 12/30/2022 12:00 AM     Problem: Health Management needs related to DMII and HF      Long-Range Goal: Development of Plan of Care to address Health Management needs related to DMII and HF   Start Date: 07/10/2021  Expected End Date: 01/28/2023  Priority: High  Note:   Current Barriers:  Chronic Disease Management support and education needs related to CHF and DMII Ms. Gerbers has HD on M/W/F. She reports feeling better. She is in a program that she enjoys because it gives her something to do. She is aware of upcoming appointments and reports having transportation.   RNCM Clinical Goal(s):  Patient will verbalize understanding of plan for management of CHF and DMII as evidenced by patient verbalization of self monitoring activities take all medications exactly as prescribed and will call provider for medication related questions as evidenced by documentation in EMR    attend all scheduled medical appointments: Dialysis on M/W/F, PCP 12/31/22 and Endocrinology  on 01/07/23  as evidenced by provider documentation          Interventions: Inter-disciplinary care team collaboration (see longitudinal plan of care) Evaluation of current treatment plan related to  self management and patient's adherence to plan as established by provider Provided therapeutic listening   Diabetes:  (Status: Goal on Track (progressing): YES.) Long Term Goal  Lab Results  Component Value Date   HGBA1C 8.3 (H) 08/18/2022   @ Assessed patient's understanding of A1c goal: <8% Provided education to patient about basic DM disease process; Reviewed prescribed diet with patient diabetic; Counseled on importance of regular laboratory monitoring as prescribed;        Discussed plans with patient  for ongoing care management follow up and provided patient with direct contact information for care management team;      Reviewed scheduled/upcoming provider appointments including: 12/31/22 with PCP and 01/07/23 with Endocrinology;         Review of patient status, including review of consultants reports, relevant laboratory and other test results, and medications completed;       Assessed social determinant of health barriers;        Discussed the importance of attending upcoming Endocrinology appointment on 01/07/23-patient agreed Encouragement provided Reviewed medications, advised patient to request albuterol refill   Patient Goals/Self-Care Activities: Take medications as prescribed   Attend all scheduled provider appointments Call provider office for new concerns or questions  use salt in moderation eat more whole grains, fruits and vegetables, lean meats and healthy fats dress right for the weather, hot or cold Complete requested lab work       Follow Up:  Patient agrees to Care Plan and Follow-up.  Plan: The Managed Medicaid care management team will reach out to the patient again over the next 30 days.  Date/time of next scheduled RN care management/care coordination outreach:  01/28/23 at 9am  Estanislado Emms RN, BSN [place]  Value-Based Care [facility] RN Care Coordinator [phone]

## 2022-12-31 ENCOUNTER — Encounter: Payer: Self-pay | Admitting: Family

## 2022-12-31 ENCOUNTER — Ambulatory Visit (INDEPENDENT_AMBULATORY_CARE_PROVIDER_SITE_OTHER): Payer: Medicaid Other | Admitting: Family

## 2022-12-31 VITALS — BP 130/80 | HR 80 | Temp 96.4°F | Resp 20 | Ht 67.0 in | Wt 163.8 lb

## 2022-12-31 DIAGNOSIS — E1169 Type 2 diabetes mellitus with other specified complication: Secondary | ICD-10-CM

## 2022-12-31 DIAGNOSIS — F411 Generalized anxiety disorder: Secondary | ICD-10-CM | POA: Diagnosis not present

## 2022-12-31 DIAGNOSIS — N2581 Secondary hyperparathyroidism of renal origin: Secondary | ICD-10-CM | POA: Diagnosis not present

## 2022-12-31 DIAGNOSIS — E785 Hyperlipidemia, unspecified: Secondary | ICD-10-CM

## 2022-12-31 DIAGNOSIS — F324 Major depressive disorder, single episode, in partial remission: Secondary | ICD-10-CM | POA: Diagnosis not present

## 2022-12-31 DIAGNOSIS — B182 Chronic viral hepatitis C: Secondary | ICD-10-CM | POA: Diagnosis not present

## 2022-12-31 DIAGNOSIS — N186 End stage renal disease: Secondary | ICD-10-CM | POA: Diagnosis not present

## 2022-12-31 DIAGNOSIS — Z992 Dependence on renal dialysis: Secondary | ICD-10-CM

## 2022-12-31 MED ORDER — LANTUS SOLOSTAR 100 UNIT/ML ~~LOC~~ SOPN
38.0000 [IU] | PEN_INJECTOR | Freq: Every day | SUBCUTANEOUS | 2 refills | Status: DC
Start: 2022-12-31 — End: 2023-07-27

## 2022-12-31 MED ORDER — BUSPIRONE HCL 5 MG PO TABS: 5.0000 mg | ORAL_TABLET | Freq: Two times a day (BID) | ORAL | 3 refills | Status: DC

## 2022-12-31 NOTE — Progress Notes (Signed)
Provider: Richarda Blade FNP-C  Agastya Meister, Donalee Citrin, NP  Patient Care Team: Zaiden Ludlum, Donalee Citrin, NP as PCP - General (Family Medicine) Wyline Mood [name], MD as PCP - Cardiology (Cardiology) [name], RN as Case Manager  Extended Emergency Contact Information Primary Emergency Contact: Flippen,Shavon Mobile Phone: [phone] Relation: Daughter Secondary Emergency Contact: Fountain,Chandy Mobile Phone: [phone] Relation: Sister  Code Status:  Full Code  Goals of care: Advanced Directive information    12/31/2022   10:34 AM  Advanced Directives  Does Patient Have a Medical Advance Directive? No     Chief Complaint  Patient presents with   Follow-up    Wants to get labs done - BS at home has been running 200-300    HPI:  Pt is a 62 y.o. female seen today for an acute visit for evaluation of increased anxiety.states get irritable and angry.Feels depressed.  States blood sugar has been running in the 200's -300's.though has no appetite.chews on ice. Does not exercise. Has been using her Lantus 35 units daily. Has upcoming appointment with Endocrinologist 01/17/2023.   Past Medical History:  Diagnosis Date   Allergic rhinitis    Anxiety    CHF (congestive heart failure) (HCC)    a. EF 30-35% by echo in 05/2020 b. EF at 45% in 09/2020   Chronic back pain    Chronic hepatitis C without hepatic coma (HCC)    COVID-19 virus infection 12/26/2019   Depression    Diabetes mellitus    Hepatitis C    Hypertension    Noncompliance    Poor appetite 07/17/2014   Substance abuse (HCC)    HX of drug use and alcohol use   Past Surgical History:  Procedure Laterality Date   APPENDECTOMY  2004   AV FISTULA PLACEMENT Left 11/26/2020   Procedure: LEFT ARM ARTERIOVENOUS (AV) FISTULA CREATION;  Surgeon: Larina Earthly, MD;  Location: [facility];  Service: Vascular;  Laterality: Left;   AV FISTULA PLACEMENT Left 06/03/2021   Procedure: INSERTION OF LEFT UPPER ARM ARTERIOVENOUS  (AV) GORE-TEX GRAFT;  Surgeon: Larina Earthly, MD;  Location: [facility];  Service: Vascular;  Laterality: Left;   EXCHANGE OF A DIALYSIS CATHETER Right 02/24/2022   Procedure: EXCHANGE OF A DIALYSIS CATHETER;  Surgeon: Larina Earthly, MD;  Location: [facility];  Service: Vascular;  Laterality: Right;   IR FLUORO GUIDE CV LINE RIGHT  12/25/2020   IR US GUIDE VASC ACCESS RIGHT  12/25/2020   LIGATION ARTERIOVENOUS GORTEX GRAFT Left 08/05/2021   Procedure: LIGATION OF LEFT ARM ARTERIOVENOUS GORTEX GRAFT;  Surgeon: Larina Earthly, MD;  Location: [facility];  Service: Vascular;  Laterality: Left;    Allergies  Allergen Reactions   Penicillins Itching and Rash    Outpatient Encounter Medications as of 12/31/2022  Medication Sig   Accu-Chek Softclix Lancets lancets USE TO TEST BLOOD SUGAR TWICE DAILY AS DIRECTED *REFILL REQUEST*   albuterol (VENTOLIN HFA) 108 (90 Base) MCG/ACT inhaler Inhale 2 puffs into the lungs every 4 (four) hours as needed for wheezing or shortness of breath.   amLODipine (NORVASC) 10 MG tablet Take 10 mg by mouth daily.   Blood Glucose Monitoring Suppl (ONETOUCH VERIO FLEX SYSTEM) w/Device KIT USE TO TEST BLOOD SUGAR TWICE DAILY AS DIRECTED *REFILL REQUEST*   COMFORT EZ PEN NEEDLES 32G X 4 MM MISC USE AS DIRECTED with insulin injections   D3-1000 25 MCG (1000 UT) capsule Take 1,000 Units by mouth daily.   insulin glargine (LANTUS  SOLOSTAR) 100 UNIT/ML Solostar Pen Inject 35 units into THE SKIN AT bedtime   Insulin Pen Needle (PEN NEEDLES) 31G X 5 MM MISC Use to inject insulin once daily   metoprolol succinate (TOPROL-XL) 50 MG 24 hr tablet TAKE ONE TABLET BY MOUTH EVERY EVENING   mirtazapine (REMERON) 15 MG tablet TAKE ONE TABLET BY MOUTH EVERY EVENING   rosuvastatin (CRESTOR) 10 MG tablet Take 1 tablet (10 mg total) by mouth every morning.   sevelamer carbonate (RENVELA) 800 MG tablet Take 800 mg by mouth 3 (three) times daily with meals.   [DISCONTINUED] isosorbide mononitrate (IMDUR) 30  MG 24 hr tablet TAKE ONE TABLET BY MOUTH EVERY MORNING   No facility-administered encounter medications on file as of 12/31/2022.    Review of Systems  Constitutional:  Negative for appetite change, chills, fatigue, fever and unexpected weight change.  HENT:  Negative for congestion, ear discharge, ear pain, hearing loss, nosebleeds, postnasal drip, rhinorrhea, sinus pressure, sinus pain, sneezing, sore throat, tinnitus and trouble swallowing.   Eyes:  Negative for pain, discharge, redness, itching and visual disturbance.  Respiratory:  Negative for cough, chest tightness, shortness of breath and wheezing.   Cardiovascular:  Negative for chest pain, palpitations and leg swelling.  Gastrointestinal:  Negative for abdominal distention, abdominal pain, constipation, diarrhea, nausea and vomiting.  Endocrine: Negative for cold intolerance, heat intolerance, polydipsia, polyphagia and polyuria.  Genitourinary:  Negative for difficulty urinating, dysuria, flank pain, frequency and urgency.  Musculoskeletal:  Negative for arthralgias, back pain, gait problem, joint swelling, myalgias, neck pain and neck stiffness.  Skin:  Negative for color change, pallor, rash and wound.  Neurological:  Negative for dizziness, light-headedness, numbness and headaches.  Psychiatric/Behavioral:  Negative for agitation, behavioral problems, confusion, self-injury, sleep disturbance and suicidal ideas. The patient is nervous/anxious.     Immunization History  Administered Date(s) Administered   H1N1 04/04/2008   Hepb-cpg 02/07/2021, 03/14/2021, 04/09/2021, 06/04/2021   Influenza Split 02/05/2011, 12/02/2011   Influenza Whole 12/10/2006, 01/08/2009   Influenza, Quadrivalent, Recombinant, Inj, Pf 12/26/2021   Influenza,inj,Quad PF,6+ Mos 02/12/2014, 03/27/2015, 03/05/2020, 01/22/2021   PFIZER(Purple Top)SARS-COV-2 Vaccination 09/29/2019   Pneumococcal Conjugate-13 06/11/2014   Pneumococcal Polysaccharide-23  12/05/2004, 10/30/2010, 01/09/2022   Td 08/09/2003   Pertinent  Health Maintenance Due  Topic Date Due   OPHTHALMOLOGY EXAM  09/17/2021   FOOT EXAM  05/12/2022   INFLUENZA VACCINE  10/22/2022   HEMOGLOBIN A1C  02/18/2023   MAMMOGRAM  07/13/2024      01/15/2022    9:27 AM 01/20/2022    1:14 PM 05/05/2022    8:58 AM 08/18/2022    9:20 AM 12/31/2022   10:36 AM  Fall Risk  Falls in the past year? 1  0 0 0  Was there an injury with Fall? 0  0 0   Fall Risk Category Calculator 1  0 0   Fall Risk Category (Retired) Low      (RETIRED) Patient Fall Risk Level Low fall risk Low fall risk     Patient at Risk for Falls Due to History of fall(s)  No Fall Risks No Fall Risks   Fall risk Follow up   Falls evaluation completed Falls evaluation completed    Functional Status Survey:    Vitals:   12/31/22 1037  BP: 130/80  Pulse: 80  Resp: 20  Temp: (!) 96.4 F (35.8 C)  SpO2: 98%  Weight: 163 lb 12.8 oz (74.3 kg)  Height: 5\' 7"  (1.702 m)   Body  mass index is 25.65 kg/m. Physical Exam Vitals reviewed.  Constitutional:      General: She is not in acute distress.    Appearance: Normal appearance. She is overweight. She is not ill-appearing or diaphoretic.  HENT:     Head: Normocephalic.  Neck:     Vascular: No carotid bruit.  Cardiovascular:     Rate and Rhythm: Normal rate and regular rhythm.     Pulses: Normal pulses.     Heart sounds: Normal heart sounds. No murmur heard.    No friction rub. No gallop.  Pulmonary:     Effort: Pulmonary effort is normal. No respiratory distress.     Breath sounds: Normal breath sounds. No wheezing, rhonchi or rales.  Chest:     Chest wall: No tenderness.  Abdominal:     General: Bowel sounds are normal. There is no distension.     Palpations: Abdomen is soft. There is no mass.     Tenderness: There is no abdominal tenderness. There is no right CVA tenderness, left CVA tenderness, guarding or rebound.  Musculoskeletal:        General: No  swelling or tenderness. Normal range of motion.     Cervical back: Normal range of motion. No rigidity or tenderness.     Right lower leg: No edema.     Left lower leg: No edema.  Lymphadenopathy:     Cervical: No cervical adenopathy.  Skin:    General: Skin is warm and dry.     Coloration: Skin is not pale.     Findings: No erythema or rash.  Neurological:     Mental Status: She is alert and oriented to person, place, and time.     Cranial Nerves: No cranial nerve deficit.     Sensory: No sensory deficit.     Motor: No weakness.     Coordination: Coordination normal.     Gait: Gait normal.  Psychiatric:        Mood and Affect: Mood is anxious.        Speech: Speech normal.        Behavior: Behavior normal.        Thought Content: Thought content normal.        Judgment: Judgment normal.     Labs reviewed: Recent Labs    01/08/22 0924 01/20/22 1350 02/24/22 0843 04/30/22 0000 05/08/22 0000  NA 127* 132* 136 135*  --   K 4.8 3.9 4.8 4.8  --   CL 88* 95* 95* 99  --   CO2 22 23 25   --   --   GLUCOSE 598* 504* 369*  --   --   BUN 34* 34* 34* 36* 50*  CREATININE 6.06* 6.42* 5.76* 7.8*  --   CALCIUM 9.4 9.1 9.4 9.4  --    Recent Labs    01/08/22 0924 01/20/22 1350 04/30/22 0000  AST 13 14*  --   ALT 14 15  --   ALKPHOS  --  78 96  BILITOT 0.5 0.5  --   PROT 7.6 7.4  --   ALBUMIN  --  3.2*  --    Recent Labs    01/08/22 0924 01/20/22 1350 02/24/22 0843 04/30/22 0000  WBC 8.3 6.2  --  6.3  NEUTROABS 3,685 2.9  --   --   HGB 8.2* 8.4* 10.9* 12.8  HCT 30.6* 29.9* 37.3 41  MCV 72.9* 73.5*  --   --   PLT 230 194  --  142*   Lab Results  Component Value Date   TSH 1.70 05/08/2022   Lab Results  Component Value Date   HGBA1C 8.3 (H) 08/18/2022   Lab Results  Component Value Date   CHOL 241 (H) 08/18/2022   HDL 36 (L) 08/18/2022   LDLCALC 154 (H) 08/18/2022   TRIG 329 (H) 08/18/2022   CHOLHDL 6.7 (H) 08/18/2022    Significant Diagnostic Results in  last 30 days:  No results found.  Assessment/Plan  1. Type 2 diabetes mellitus with hyperlipidemia (HCC) Lab Results  Component Value Date   HGBA1C 8.3 (H) 08/18/2022  - previous A1C was 11.9  Reports CBG in the 200's -300's at home but did not bring her log to visit today. - Advised to increase Lantus from 35 units to 38 units daily  - check CBG twice daily in the morning and evening and record on provided log.Advised to bring log to visit next month visit. - continue on Statin - follows up with [facility] podiatrist for foot exam - monofilament sensation intact   - continue to follow up with Ophthalmology.up to date - Has follow up with Endocrinologist 01/17/2023  - insulin glargine (LANTUS SOLOSTAR) 100 UNIT/ML Solostar Pen; Inject 38 Units into the skin at bedtime.  Dispense: 15 mL; Refill: 2  2. Anxiety and depression Start on Buspar as below - busPIRone (BUSPAR) 5 MG tablet; Take 1 tablet (5 mg total) by mouth 2 (two) times daily.  Dispense: 60 tablet; Refill: 3  3. Secondary hyperparathyroidism (HCC) Lab Results  Component Value Date   TSH 1.70 05/08/2022    4. Depression, major, single episode, in partial remission (HCC) Start on buspirone as above  - continue to monitor mood   5. Chronic hepatitis C without hepatic coma (HCC) Chronic Treated in the past   6. ESRD on dialysis [facility]) Continue with Hemodialysis  - continue to follow up with Nephrologist   Family/ staff Communication: Reviewed plan of care with patient verbalized understanding   Labs/tests ordered: None   Next Appointment: Return if symptoms worsen or fail to improve.   Caesar Bookman, NP

## 2022-12-31 NOTE — Patient Instructions (Signed)
-   Increase Lantus from 35 units to 38 units daily at bedtime -check blood sugars twice daily in the morning and evening.Record on log provided then bring log to visit in one month to evaluate.

## 2023-01-02 DIAGNOSIS — N186 End stage renal disease: Secondary | ICD-10-CM | POA: Diagnosis not present

## 2023-01-03 DIAGNOSIS — N186 End stage renal disease: Secondary | ICD-10-CM | POA: Diagnosis not present

## 2023-01-07 ENCOUNTER — Ambulatory Visit (INDEPENDENT_AMBULATORY_CARE_PROVIDER_SITE_OTHER): Payer: Medicaid Other | Admitting: "Endocrinology

## 2023-01-07 ENCOUNTER — Encounter: Payer: Self-pay | Admitting: "Endocrinology

## 2023-01-07 VITALS — BP 132/82 | HR 60 | Ht 67.0 in | Wt 166.2 lb

## 2023-01-07 DIAGNOSIS — E1165 Type 2 diabetes mellitus with hyperglycemia: Secondary | ICD-10-CM | POA: Diagnosis not present

## 2023-01-07 DIAGNOSIS — E782 Mixed hyperlipidemia: Secondary | ICD-10-CM | POA: Diagnosis not present

## 2023-01-07 DIAGNOSIS — Z794 Long term (current) use of insulin: Secondary | ICD-10-CM

## 2023-01-07 LAB — POCT GLYCOSYLATED HEMOGLOBIN (HGB A1C): Hemoglobin A1C: 9.4 % — AB (ref 4.0–5.6)

## 2023-01-07 MED ORDER — FREESTYLE LIBRE 3 SENSOR MISC: 1.0000 | 3 refills | Status: AC

## 2023-01-07 NOTE — Progress Notes (Signed)
Outpatient Endocrinology Note [place], MD  01/07/23   Latoya Walsh 1960/04/19 [id number]  Referring Provider: Caesar Bookman, NP Primary Care Provider: Caesar Bookman, NP Reason for consultation: Subjective   Assessment & Plan  Diagnoses and all orders for this visit:  Uncontrolled type 2 diabetes mellitus with hyperglycemia (HCC) -     POCT glycosylated hemoglobin (Hb A1C) -     Ambulatory referral to diabetic education -     Lipid panel; Future -     Microalbumin / creatinine urine ratio; Future -     Comprehensive metabolic panel; Future  Long-term insulin use (HCC)  Mixed hypercholesterolemia and hypertriglyceridemia  Other orders -     Continuous Glucose Sensor (FREESTYLE LIBRE 3 SENSOR) MISC; 1 Device by Does not apply route continuous. Place 1 sensor on the skin every 14 days. Use to check glucose continuously   Diabetes Type II complicated by neuropathy, No results found for: "GFR" Hba1c goal less than 7, current Hba1c is  Lab Results  Component Value Date   HGBA1C 9.4 (A) 01/07/2023   Will recommend the following: Lantus 42 units qam (shift to mornings to improve compliance)  Ordered libre Instructed to check tid ac  Ordered DM education  No known contraindications/side effects to any of above medications  -Last LD and Tg are as follows: Lab Results  Component Value Date   LDLCALC 154 (H) 08/18/2022    Lab Results  Component Value Date   TRIG 329 (H) 08/18/2022   -On rosuvastatin 10 mg QD -Follow low fat diet and exercise   -Blood pressure goal <140/90 - Microalbumin/creatinine goal is < 30 -Last MA/Cr is as follows: Lab Results  Component Value Date   MICROALBUR >600.0 03/28/2019   -not on ACE/ARB  -diet changes including salt restriction -limit eating outside -counseled BP targets per standards of diabetes care -uncontrolled blood pressure can lead to retinopathy, nephropathy and cardiovascular and atherosclerotic  heart disease  Reviewed and counseled on: -A1C target -Blood sugar targets -Complications of uncontrolled diabetes  -Checking blood sugar before meals and bedtime and bring log next visit -All medications with mechanism of action and side effects -Hypoglycemia management: rule of 15's, Glucagon Emergency Kit and medical alert ID -low-carb low-fat plate-method diet -At least 20 minutes of physical activity per day -Annual dilated retinal eye exam and foot exam -compliance and follow up needs -follow up as scheduled or earlier if problem gets worse  Call if blood sugar is less than 70 or consistently above 250    Take a 15 gm snack of carbohydrate at bedtime before you go to sleep if your blood sugar is less than 100.    If you are going to fast after midnight for a test or procedure, ask your physician for instructions on how to reduce/decrease your insulin dose.    Call if blood sugar is less than 70 or consistently above 250  -Treating a low sugar by rule of 15  (15 gms of sugar every 15 min until sugar is more than 70) If you feel your sugar is low, test your sugar to be sure If your sugar is low (less than 70), then take 15 grams of a fast acting Carbohydrate (3-4 glucose tablets or glucose gel or 4 ounces of juice or regular soda) Recheck your sugar 15 min after treating low to make sure it is more than 70 If sugar is still less than 70, treat again with 15 grams of carbohydrate  Don't drive the hour of hypoglycemia  If unconscious/unable to eat or drink by mouth, use glucagon injection or nasal spray baqsimi and call 911. Can repeat again in 15 min if still unconscious.  Return in about 4 weeks (around 02/04/2023).   I have reviewed current medications, nurse's notes, allergies, vital signs, past medical and surgical history, family medical history, and social history for this encounter. Counseled patient on symptoms, examination findings, lab findings, imaging results,  treatment decisions and monitoring and prognosis. The patient understood the recommendations and agrees with the treatment plan. All questions regarding treatment plan were fully answered.  Latoya [place], MD  01/07/23    History of Present Illness Latoya Walsh is a 62 y.o. year old female who presents for evaluation of Type II diabetes mellitus.  Latoya Walsh was first diagnosed around 2019.   Diabetes education ?  Home diabetes regimen: Lantus 38 units qhs  COMPLICATIONS -  MI/Stroke -  retinopathy +  neuropathy +  nephropathy ESRD on HD M/W/F  SYMPTOMS REVIEWED - Polyuria - Weight loss - Blurred vision  BLOOD SUGAR DATA Check fasting: 106-229  Physical Exam  BP 132/82   Pulse 60   Ht 5\' 7"  (1.702 m)   Wt 166 lb 3.2 oz (75.4 kg)   LMP 06/23/2010   SpO2 90%   BMI 26.03 kg/m    Constitutional: well developed, well nourished Head: normocephalic, atraumatic Eyes: sclera anicteric, no redness Neck: supple Lungs: normal respiratory effort Neurology: alert and oriented Skin: dry, no appreciable rashes Musculoskeletal: no appreciable defects Psychiatric: normal mood and affect Diabetic Foot Exam - Simple   No data filed      Current Medications Patient's Medications  New Prescriptions   CONTINUOUS GLUCOSE SENSOR (FREESTYLE LIBRE 3 SENSOR) MISC    1 Device by Does not apply route continuous. Place 1 sensor on the skin every 14 days. Use to check glucose continuously  Previous Medications   ACCU-CHEK SOFTCLIX LANCETS LANCETS    USE TO TEST BLOOD SUGAR TWICE DAILY AS DIRECTED *REFILL REQUEST*   ALBUTEROL (VENTOLIN HFA) 108 (90 BASE) MCG/ACT INHALER    Inhale 2 puffs into the lungs every 4 (four) hours as needed for wheezing or shortness of breath.   AMLODIPINE (NORVASC) 10 MG TABLET    Take 10 mg by mouth daily.   BLOOD GLUCOSE MONITORING SUPPL (ONETOUCH VERIO FLEX SYSTEM) W/DEVICE KIT    USE TO TEST BLOOD SUGAR TWICE DAILY AS DIRECTED *REFILL REQUEST*    BUSPIRONE (BUSPAR) 5 MG TABLET    Take 1 tablet (5 mg total) by mouth 2 (two) times daily.   COMFORT EZ PEN NEEDLES 32G X 4 MM MISC    USE AS DIRECTED with insulin injections   D3-1000 25 MCG (1000 UT) CAPSULE    Take 1,000 Units by mouth daily.   INSULIN GLARGINE (LANTUS SOLOSTAR) 100 UNIT/ML SOLOSTAR PEN    Inject 38 Units into the skin at bedtime.   INSULIN PEN NEEDLE (PEN NEEDLES) 31G X 5 MM MISC    Use to inject insulin once daily   METOPROLOL SUCCINATE (TOPROL-XL) 50 MG 24 HR TABLET    TAKE ONE TABLET BY MOUTH EVERY EVENING   MIRTAZAPINE (REMERON) 15 MG TABLET    TAKE ONE TABLET BY MOUTH EVERY EVENING   ROSUVASTATIN (CRESTOR) 10 MG TABLET    Take 1 tablet (10 mg total) by mouth every morning.   SEVELAMER CARBONATE (RENVELA) 800 MG TABLET    Take 800 mg by  mouth 3 (three) times daily with meals.  Modified Medications   No medications on file  Discontinued Medications   No medications on file    Allergies Allergies  Allergen Reactions   Penicillins Itching and Rash    Past Medical History Past Medical History:  Diagnosis Date   Allergic rhinitis    Anxiety    CHF (congestive heart failure) (HCC)    a. EF 30-35% by echo in 05/2020 b. EF at 45% in 09/2020   Chronic back pain    Chronic hepatitis C without hepatic coma (HCC)    COVID-19 virus infection 12/26/2019   Depression    Diabetes mellitus    Hepatitis C    Hypertension    Noncompliance    Poor appetite 07/17/2014   Substance abuse (HCC)    HX of drug use and alcohol use    Past Surgical History Past Surgical History:  Procedure Laterality Date   APPENDECTOMY  2004   AV FISTULA PLACEMENT Left 11/26/2020   Procedure: LEFT ARM ARTERIOVENOUS (AV) FISTULA CREATION;  Surgeon: Larina Earthly, MD;  Location: [facility];  Service: Vascular;  Laterality: Left;   AV FISTULA PLACEMENT Left 06/03/2021   Procedure: INSERTION OF LEFT UPPER ARM ARTERIOVENOUS (AV) GORE-TEX GRAFT;  Surgeon: Larina Earthly, MD;  Location: [facility];   Service: Vascular;  Laterality: Left;   EXCHANGE OF A DIALYSIS CATHETER Right 02/24/2022   Procedure: EXCHANGE OF A DIALYSIS CATHETER;  Surgeon: Larina Earthly, MD;  Location: [facility];  Service: Vascular;  Laterality: Right;   IR FLUORO GUIDE CV LINE RIGHT  12/25/2020   IR US GUIDE VASC ACCESS RIGHT  12/25/2020   LIGATION ARTERIOVENOUS GORTEX GRAFT Left 08/05/2021   Procedure: LIGATION OF LEFT ARM ARTERIOVENOUS GORTEX GRAFT;  Surgeon: Larina Earthly, MD;  Location: [facility];  Service: Vascular;  Laterality: Left;    Family History family history includes Diabetes in her sister.  Social History Social History   Socioeconomic History   Marital status: Single    Spouse name: Not on file   Number of children: 3   Years of education: Not on file   Highest education level: Not on file  Occupational History   Occupation: Disabled  Tobacco Use   Smoking status: Light Smoker    Current packs/day: 0.25    Types: Cigarettes   Smokeless tobacco: Never  Vaping Use   Vaping status: Never Used  Substance and Sexual Activity   Alcohol use: Not Currently    Alcohol/week: 40.0 standard drinks of alcohol    Types: 40 Cans of beer per week    Comment: 2 16oz cans of beer a day   Drug use: Not Currently    Types: Marijuana, Cocaine    Comment: cant rememeber last time used.   Sexual activity: Yes    Birth control/protection: Condom  Other Topics Concern   Not on file  Social History Narrative   Not on file   Social Determinants of Health   Financial Resource Strain: Low Risk  (07/26/2020)   Overall Financial Resource Strain (CARDIA)    Difficulty of Paying Living Expenses: Not hard at all  Food Insecurity: No Food Insecurity (12/30/2022)   Hunger Vital Sign    Worried About Running Out of Food in the Last Year: Never true    Ran Out of Food in the Last Year: Never true  Transportation Needs: Unmet Transportation Needs (12/30/2022)   PRAPARE - Administrator, Civil Service (  Medical):  Yes    Lack of Transportation (Non-Medical): No  Physical Activity: Inactive (12/19/2020)   Exercise Vital Sign    Days of Exercise per Week: 0 days    Minutes of Exercise per Session: 0 min  Stress: Stress Concern Present (08/21/2021)   Latoya Walsh [employer] Occupational [employer] - Occupational Stress Questionnaire    Feeling of Stress : Rather much  Social Connections: Moderately Integrated (12/19/2020)   Social Connection and Isolation Panel [NHANES]    Frequency of Communication with Friends and Family: More than three times a week    Frequency of Social Gatherings with Friends and Family: Once a week    Attends Religious Services: More than 4 times per year    Active Member of Clubs or Organizations: Yes    Attends Banker Meetings: More than 4 times per year    Marital Status: Separated  Intimate Partner Violence: Not At Risk (09/30/2022)   Humiliation, Afraid, Rape, and Kick questionnaire    Fear of Current or Ex-Partner: No    Emotionally Abused: No    Physically Abused: No    Sexually Abused: No    Lab Results  Component Value Date   HGBA1C 9.4 (A) 01/07/2023   HGBA1C 8.3 (H) 08/18/2022   HGBA1C 11.9 05/08/2022   Lab Results  Component Value Date   CHOL 241 (H) 08/18/2022   Lab Results  Component Value Date   HDL 36 (L) 08/18/2022   Lab Results  Component Value Date   LDLCALC 154 (H) 08/18/2022   Lab Results  Component Value Date   TRIG 329 (H) 08/18/2022   Lab Results  Component Value Date   CHOLHDL 6.7 (H) 08/18/2022   Lab Results  Component Value Date   CREATININE 7.8 (A) 04/30/2022   No results found for: "GFR" Lab Results  Component Value Date   MICROALBUR >600.0 03/28/2019      Component Value Date/Time   NA 135 (A) 04/30/2022 0000   K 4.8 04/30/2022 0000   CL 99 04/30/2022 0000   CO2 25 02/24/2022 0843   GLUCOSE 369 (H) 02/24/2022 0843   BUN 50 (A) 05/08/2022 0000   CREATININE 7.8 (A) 04/30/2022 0000   CREATININE 5.76 (H)  02/24/2022 0843   CREATININE 6.06 (H) 01/08/2022 0924   CALCIUM 9.4 04/30/2022 0000   PROT 7.4 01/20/2022 1350   PROT 6.2 05/02/2020 1535   ALBUMIN 3.2 (L) 01/20/2022 1350   ALBUMIN 3.3 (L) 05/02/2020 1535   AST 14 (L) 01/20/2022 1350   ALT 15 01/20/2022 1350   ALT 65 (H) 12/04/2013 1449   ALKPHOS 96 04/30/2022 0000   BILITOT 0.5 01/20/2022 1350   BILITOT <0.2 05/02/2020 1535   BILITOT 0.3 12/04/2013 1449   GFRNONAA 8 (L) 02/24/2022 0843   GFRNONAA 39 (L) 03/28/2019 1056   GFRAA 31 (L) 05/02/2020 1535   GFRAA 45 (L) 03/28/2019 1056      Latest Ref Rng & Units 05/08/2022   12:00 AM 04/30/2022   12:00 AM 02/24/2022    8:43 AM  BMP  Glucose 70 - 99 mg/dL   937   BUN 4 - 21 50     36     34   Creatinine 0.5 - 1.1  7.8     5.76   Sodium 137 - 147  135     136   Potassium 3.5 - 5.1 mEq/L  4.8     4.8   Chloride 99 - 108  99  95   CO2 22 - 32 mmol/L   25   Calcium 8.7 - 10.7  9.4     9.4      This result is from an external source.       Component Value Date/Time   WBC 6.3 04/30/2022 0000   WBC 6.2 01/20/2022 1350   RBC 4.75 04/30/2022 0000   HGB 12.8 04/30/2022 0000   HGB 10.5 (L) 01/05/2020 1132   HCT 41 04/30/2022 0000   HCT 31.9 (L) 01/05/2020 1132   PLT 142 (A) 04/30/2022 0000   PLT 191 01/05/2020 1132   MCV 73.5 (L) 01/20/2022 1350   MCV 90 01/05/2020 1132   MCH 20.6 (L) 01/20/2022 1350   MCHC 28.1 (L) 01/20/2022 1350   RDW 22.8 (H) 01/20/2022 1350   RDW 12.7 01/05/2020 1132   LYMPHSABS 2.5 01/20/2022 1350   MONOABS 0.6 01/20/2022 1350   EOSABS 0.1 01/20/2022 1350   BASOSABS 0.0 01/20/2022 1350     Parts of this note may have been dictated using voice recognition software. There may be variances in spelling and vocabulary which are unintentional. Not all errors are proofread. Please notify the Thereasa Parkin if any discrepancies are noted or if the meaning of any statement is not clear.

## 2023-01-08 LAB — COMPREHENSIVE METABOLIC PANEL
ALT: 10 U/L (ref 0–35)
AST: 9 U/L (ref 0–37)
Albumin: 4.1 g/dL (ref 3.5–5.2)
Alkaline Phosphatase: 88 U/L (ref 39–117)
BUN: 35 mg/dL — ABNORMAL HIGH (ref 6–23)
CO2: 26 meq/L (ref 19–32)
Calcium: 9.3 mg/dL (ref 8.4–10.5)
Chloride: 100 meq/L (ref 96–112)
Creatinine, Ser: 6.04 mg/dL (ref 0.40–1.20)
GFR: 7.11 mL/min — CL (ref >60.00)
Glucose, Bld: 359 mg/dL — ABNORMAL HIGH (ref 70–99)
Potassium: 4.5 meq/L (ref 3.5–5.1)
Sodium: 139 meq/L (ref 135–145)
Total Bilirubin: 0.4 mg/dL (ref 0.2–1.2)
Total Protein: 7.4 g/dL (ref 6.0–8.3)

## 2023-01-08 LAB — LIPID PANEL
Cholesterol: 129 mg/dL (ref 0–200)
HDL: 30.1 mg/dL — ABNORMAL LOW (ref >39.00)
LDL Cholesterol: 47 mg/dL (ref 0–99)
NonHDL: 98.65
Total CHOL/HDL Ratio: 4
Triglycerides: 258 mg/dL — ABNORMAL HIGH (ref 0.0–149.0)
VLDL: 51.6 mg/dL — ABNORMAL HIGH (ref 0.0–40.0)

## 2023-01-08 LAB — MICROALBUMIN / CREATININE URINE RATIO
Creatinine,U: 95.5 mg/dL
Microalb Creat Ratio: 72.8 mg/g — ABNORMAL HIGH (ref 0.0–30.0)
Microalb, Ur: 69.5 mg/dL — ABNORMAL HIGH (ref 0.0–1.9)

## 2023-01-09 DIAGNOSIS — N186 End stage renal disease: Secondary | ICD-10-CM | POA: Diagnosis not present

## 2023-01-10 DIAGNOSIS — N186 End stage renal disease: Secondary | ICD-10-CM | POA: Diagnosis not present

## 2023-01-16 DIAGNOSIS — N186 End stage renal disease: Secondary | ICD-10-CM | POA: Diagnosis not present

## 2023-01-17 DIAGNOSIS — N186 End stage renal disease: Secondary | ICD-10-CM | POA: Diagnosis not present

## 2023-01-21 DIAGNOSIS — Z992 Dependence on renal dialysis: Secondary | ICD-10-CM | POA: Diagnosis not present

## 2023-01-21 DIAGNOSIS — N186 End stage renal disease: Secondary | ICD-10-CM | POA: Diagnosis not present

## 2023-01-21 DIAGNOSIS — E1122 Type 2 diabetes mellitus with diabetic chronic kidney disease: Secondary | ICD-10-CM | POA: Diagnosis not present

## 2023-01-23 DIAGNOSIS — N186 End stage renal disease: Secondary | ICD-10-CM | POA: Diagnosis not present

## 2023-01-24 DIAGNOSIS — N186 End stage renal disease: Secondary | ICD-10-CM | POA: Diagnosis not present

## 2023-01-28 ENCOUNTER — Other Ambulatory Visit: Payer: Self-pay | Admitting: *Deleted

## 2023-01-28 NOTE — Patient Instructions (Signed)
Visit Information  Ms. Fuller was given information about Medicaid Managed Care team care coordination services as a part of their [facility] Community Plan Medicaid benefit. [name] verbally consented to engagement with the [facility] Managed Care team.   If you are experiencing a medical emergency, please call 911 or report to your local emergency department or urgent care.   If you have a non-emergency medical problem during routine business hours, please contact your provider's office and ask to speak with a nurse.   For questions related to your [facility], please call: [phone] or visit the homepage here: [email or web address]  If you would like to schedule transportation through your [facility], please call the following number at least 2 days in advance of your appointment: [phone]   Rides for urgent appointments can also be made after hours by calling Member Services.  Call the Behavioral Health Crisis Line at [phone], at any time, 24 hours a day, 7 days a week. If you are in danger or need immediate medical attention call 911.  If you would like help to quit smoking, call 1-800-QUIT-NOW (934 048 0650) OR Espaol: 1-855-Djelo-Ya (6-440-347-4259) o para ms informacin haga clic aqu or Text READY to 563-875 to register via text  [name],   Please see education materials related to DM provided by MyChart link. 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   The patient verbalized understanding of instructions, educational materials, and care plan provided today and agreed to receive a mailed copy of patient instructions, educational materials, and care plan.   Telephone follow up appointment with Managed Medicaid care management team member scheduled for:02/16/23 at 2:30pm  Estanislado Emms RN, BSN [place]  [facility] RN Care  Coordinator [phone]   Following is a copy of your plan of care:  Care Plan : RN Care Manager Plan of Care  Updates made by [name], RN since 01/28/2023 12:00 AM     Problem: Health Management needs related to DMII and HF      Long-Range Goal: Development of Plan of Care to address Health Management needs related to DMII and HF   Start Date: 07/10/2021  Expected End Date: 01/28/2023  Priority: High  Note:   Current Barriers:  Chronic Disease Management support and education needs related to CHF and DMII Ms. Tamura has HD on M/W/F. She reports feeling better. She reports needing transportation to upcoming appointments. She had an initial visit with endocrinology and has follow up on 02/09/23. She has changed her insulin schedule and checking BS once daily.   RNCM Clinical Goal(s):  Patient will verbalize understanding of plan for management of CHF and DMII as evidenced by patient verbalization of self monitoring activities take all medications exactly as prescribed and will call provider for medication related questions as evidenced by documentation in EMR    attend all scheduled medical appointments: Dialysis on M/W/F, Endocrinology on 02/09/23, lab on 02/11/23 and  as evidenced by provider documentation          Interventions: Inter-disciplinary care team collaboration (see longitudinal plan of care) Evaluation of current treatment plan related to  self management and patient's adherence to plan as established by provider Provided therapeutic listening   Diabetes:  (Status: Goal on Track (progressing): YES.) Long Term Goal  Lab Results  Component Value Date   HGBA1C 9.4 (A) 01/07/2023   @ Assessed patient's understanding of A1c goal: <8% Provided education to patient about basic DM  disease process; Reviewed prescribed diet with patient diabetic; Counseled on importance of regular laboratory monitoring as prescribed;        Discussed plans with patient for ongoing  care management follow up and provided patient with direct contact information for care management team;      Reviewed scheduled/upcoming provider appointments including:  02/09/23 with Endocrinology, 02/11/23 for labs;         Review of patient status, including review of consultants reports, relevant laboratory and other test results, and medications completed;       Assessed social determinant of health barriers;        Discussed the importance of attending upcoming Endocrinology appointment on 02/09/23-patient agreed Encouragement provided Reviewed medications, advised patient to pick up CGM and take with her to her next Endocrinology appointment Discussed the benefits of exercise, advised patient to exercise every day at least 30 minutes Assisted with rescheduling PCP visit-needs a Tuesday or Thursday appointment Assisted with scheduling transportation to Endocrinology on 02/09/23-pick up from home at 9:45am and return ride home pick up at 11:45am; Lab appointment (FASTING) on 02/11/23-pick up from home at 7:50am and return ride home at 9:15am Provided with [facility] medical transportation [phone]   Patient Goals/Self-Care Activities: Take medications as prescribed   Attend all scheduled provider appointments Call provider office for new concerns or questions  use salt in moderation eat more whole grains, fruits and vegetables, lean meats and healthy fats dress right for the weather, hot or cold Complete requested lab work

## 2023-01-28 NOTE — Patient Outreach (Signed)
Medicaid Managed Care   Nurse Care Manager Note  01/28/2023 Name:  Latoya Walsh MRN:  [id number] DOB:  03/28/1960  Latoya Walsh is an 62 y.o. year old female who is a primary patient of Latoya Walsh, Latoya C, NP.  The [facility] Managed Care Coordination team was consulted for assistance with:    DMII  [name] was given information about Medicaid Managed Care Coordination team services today. Latoya Walsh Patient agreed to services and verbal consent obtained.  Engaged with patient by telephone for follow up visit in response to provider referral for case management and/or care coordination services.   Assessments/Interventions:  Review of past medical history, allergies, medications, health status, including review of consultants reports, laboratory and other test data, was performed as part of comprehensive evaluation and provision of chronic care management services.  SDOH (Social Determinants of Health) assessments and interventions performed: SDOH Interventions    Flowsheet Row Patient Outreach Telephone from 01/28/2023 in [place] [facility] DEPARTMENT Patient Outreach Telephone from 12/30/2022 in [place] POPULATION HEALTH DEPARTMENT Patient Outreach Telephone from 09/30/2022 in [place] POPULATION HEALTH DEPARTMENT Patient Outreach Telephone from 04/28/2022 in [place] POPULATION HEALTH DEPARTMENT Patient Outreach Telephone from 03/03/2022 in [place] POPULATION HEALTH DEPARTMENT Patient Outreach Telephone from 08/21/2021 in [facility] Community Care Coordination  SDOH Interventions        Food Insecurity Interventions -- Intervention Not Indicated Intervention Not Indicated Intervention Not Indicated -- --  Housing Interventions -- -- Intervention Not Indicated Intervention Not Indicated Intervention Not Indicated --  Transportation Interventions Payor Benefit  [RNCM provided with [facility] medical [phone]] Payor Benefit -- Payor  Benefit Intervention Not Indicated --  Utilities Interventions -- -- -- Intervention Not Indicated -- --  Stress Interventions -- -- -- -- -- Provide Counseling, Offered Community Wellness Resources       Care Plan  Allergies  Allergen Reactions   Penicillins Itching and Rash    Medications Reviewed Today     Reviewed by Latoya Dach, RN (Registered Nurse) on 01/28/23 at 0911  Med List Status: <None>   Medication Order Taking? Sig Documenting Provider Last Dose Status Informant  Accu-Chek Softclix Lancets lancets [id number] Yes USE TO TEST BLOOD SUGAR TWICE DAILY AS DIRECTED *REFILL REQUEST* Latoya Walsh, Latoya C, NP Taking Active   albuterol (VENTOLIN HFA) 108 (90 Base) MCG/ACT inhaler [id number] Yes Inhale 2 puffs into the lungs every 4 (four) hours as needed for wheezing or shortness of breath. Latoya Walsh D, Latoya Walsh Taking Active Self, Pharmacy Records  amLODipine (NORVASC) 10 MG tablet [id number] Yes Take 10 mg by mouth daily. Provider, Historical, Latoya Walsh Taking Active Self, Pharmacy Records           Med Note Latoya Walsh, Latoya Walsh   Thu Jan 01, 2022 10:10 AM)    Blood Glucose Monitoring Suppl (ONETOUCH VERIO FLEX SYSTEM) w/Device Andria Rhein [id number] Yes USE TO TEST BLOOD SUGAR TWICE DAILY AS DIRECTED *REFILL REQUEST* Latoya Walsh, Latoya C, NP Taking Active   busPIRone (BUSPAR) 5 MG tablet [id number] Yes Take 1 tablet (5 mg total) by mouth 2 (two) times daily. Latoya Walsh, Latoya Citrin, NP Taking Active   COMFORT EZ PEN NEEDLES 32G X 4 MM MISC [id number] Yes USE AS DIRECTED with insulin injections Latoya Gobble, NP Taking Active Self, Pharmacy Records  Continuous Glucose Sensor (FREESTYLE LIBRE 3 SENSOR) [place] [id number] No 1 Device by Does not apply route continuous. Place 1 sensor on the skin every 14 days. Use to check glucose continuously  Patient not taking: Reported on 01/28/2023   Latoya Lupton, Latoya Walsh Not Taking Active            Med Note (Latoya Walsh A   Thu Jan 28, 2023  9:09 AM) Has not picked up from the  pharmacy  D3-1000 25 MCG (1000 UT) capsule [id number] Yes Take 1,000 Units by mouth daily. Provider, Historical, Latoya Walsh Taking Active   insulin glargine (LANTUS SOLOSTAR) 100 UNIT/ML Solostar Pen [id number] Yes Inject 38 Units into the skin at bedtime. Latoya Walsh, Latoya Citrin, NP Taking Active            Med Note (Anyssa Sharpless A   Thu Jan 28, 2023  9:10 AM) Taking 42 units every morning  Insulin Pen Needle (PEN NEEDLES) 31G X 5 MM MISC [id number] Yes Use to inject insulin once daily Latoya Gobble, NP Taking Active Self, Pharmacy Records  metoprolol succinate (TOPROL-XL) 50 MG 24 hr tablet [id number] Yes TAKE ONE TABLET BY MOUTH EVERY Latoya Bold, Latoya Walsh Taking Active   mirtazapine (Latoya Walsh) 15 MG tablet [id number] Yes TAKE ONE TABLET BY MOUTH EVERY EVENING Latoya Walsh, Latoya C, NP Taking Active   rosuvastatin (CRESTOR) 10 MG tablet [id number] Yes Take 1 tablet (10 mg total) by mouth every morning. Latoya Walsh, Latoya C, NP Taking Active   sevelamer carbonate (RENVELA) 800 MG tablet [id number] Yes Take 800 mg by mouth 3 (three) times daily with meals. Provider, Historical, Latoya Walsh Taking Active Self, Pharmacy Records           Med Note Latoya Walsh, Latoya Walsh Jan 01, 2022 10:12 AM)    Med List Note [name], [place] 74/25/95 6387):              Patient Active Problem List   Diagnosis Date Noted   Secondary hyperparathyroidism (HCC) 12/31/2022   ESRD on dialysis (HCC) 01/06/2021   CHF (congestive heart failure) (HCC) 06/06/2020   Leg swelling 04/28/2020   Hyperlipidemia LDL goal <100 04/28/2020   Left hand pain 03/10/2020   Anemia 02/19/2020   COVID-19 virus infection 12/26/2019   Depression, major, single episode, in partial remission (HCC) 01/25/2019   Headache 11/13/2018   Insomnia 09/25/2018   Screening for colorectal cancer 07/23/2016   Lumbar back pain with radiculopathy affecting right lower extremity 03/27/2015   Non compliance with medical treatment 07/22/2014   Chronic hepatitis Walsh  without hepatic coma (HCC) 12/04/2013   Allergic rhinitis 11/03/2012   Anxiety and depression 02/05/2011   Nicotine dependence 11/01/2010   Dermatitis 10/30/2010   Drug dependence, continuous abuse (HCC) 03/22/2010   Uncontrolled type 2 diabetes mellitus with hyperglycemia (HCC) 06/02/2007   Alcohol abuse 06/02/2007    Conditions to be addressed/monitored per PCP order:  DMII  Care Plan : RN Care Manager Plan of Care  Updates made by Latoya Dach, RN since 01/28/2023 12:00 AM     Problem: Health Management needs related to DMII and HF      Long-Range Goal: Development of Plan of Care to address Health Management needs related to DMII and HF   Start Date: 07/10/2021  Expected End Date: 01/28/2023  Priority: High  Note:   Current Barriers:  Chronic Disease Management support and education needs related to CHF and DMII Ms. Brandes has HD on M/W/F. She reports feeling better. She reports needing transportation to upcoming appointments. She had an initial visit with endocrinology and has follow up on 02/09/23. She has changed her insulin schedule and checking BS once daily.  RNCM Clinical Goal(s):  Patient will verbalize understanding of plan for management of CHF and DMII as evidenced by patient verbalization of self monitoring activities take all medications exactly as prescribed and will call provider for medication related questions as evidenced by documentation in EMR    attend all scheduled medical appointments: Dialysis on M/W/F, Endocrinology on 02/09/23, lab on 02/11/23 and  as evidenced by provider documentation          Interventions: Inter-disciplinary care team collaboration (see longitudinal plan of care) Evaluation of current treatment plan related to  self management and patient's adherence to plan as established by provider Provided therapeutic listening   Diabetes:  (Status: Goal on Track (progressing): YES.) Long Term Goal  Lab Results  Component Value Date    HGBA1C 9.4 (A) 01/07/2023   @ Assessed patient's understanding of A1c goal: <8% Provided education to patient about basic DM disease process; Reviewed prescribed diet with patient diabetic; Counseled on importance of regular laboratory monitoring as prescribed;        Discussed plans with patient for ongoing care management follow up and provided patient with direct contact information for care management team;      Reviewed scheduled/upcoming provider appointments including:  02/09/23 with Endocrinology, 02/11/23 for labs;         Review of patient status, including review of consultants reports, relevant laboratory and other test results, and medications completed;       Assessed social determinant of health barriers;        Discussed the importance of attending upcoming Endocrinology appointment on 02/09/23-patient agreed Encouragement provided Reviewed medications, advised patient to pick up CGM and take with her to her next Endocrinology appointment Discussed the benefits of exercise, advised patient to exercise every day at least 30 minutes Assisted with rescheduling PCP visit-needs a Tuesday or Thursday appointment Assisted with scheduling transportation to Endocrinology on 02/09/23-pick up from home at 9:45am and return ride home pick up at 11:45am; Lab appointment (FASTING) on 02/11/23-pick up from home at 7:50am and return ride home at 9:15am Provided with [facility] medical transportation [phone]   Patient Goals/Self-Care Activities: Take medications as prescribed   Attend all scheduled provider appointments Call provider office for new concerns or questions  use salt in moderation eat more whole grains, fruits and vegetables, lean meats and healthy fats dress right for the weather, hot or cold Complete requested lab work       Follow Up:  Patient agrees to Care Plan and Follow-up.  Plan: The Managed Medicaid care management team will reach out to the patient again over  the next 30 days.  Date/time of next scheduled RN care management/care coordination outreach:  02/16/23 at 2:30pm  Estanislado Emms RN, BSN [employer]  [facility] RN Care Coordinator [phone]

## 2023-01-28 NOTE — Telephone Encounter (Signed)
Patient scheduled at [facility] on 05/19/22 at 2pm

## 2023-02-03 ENCOUNTER — Other Ambulatory Visit: Payer: Self-pay

## 2023-02-03 DIAGNOSIS — E785 Hyperlipidemia, unspecified: Secondary | ICD-10-CM

## 2023-02-03 DIAGNOSIS — E1169 Type 2 diabetes mellitus with other specified complication: Secondary | ICD-10-CM

## 2023-02-03 DIAGNOSIS — I120 Hypertensive chronic kidney disease with stage 5 chronic kidney disease or end stage renal disease: Secondary | ICD-10-CM

## 2023-02-09 ENCOUNTER — Other Ambulatory Visit (HOSPITAL_COMMUNITY): Payer: Self-pay

## 2023-02-09 ENCOUNTER — Encounter: Payer: Self-pay | Admitting: "Endocrinology

## 2023-02-09 ENCOUNTER — Ambulatory Visit (INDEPENDENT_AMBULATORY_CARE_PROVIDER_SITE_OTHER): Payer: Medicaid Other | Admitting: "Endocrinology

## 2023-02-09 ENCOUNTER — Telehealth: Payer: Self-pay

## 2023-02-09 VITALS — BP 132/82 | HR 90 | Ht 67.0 in | Wt 164.2 lb

## 2023-02-09 DIAGNOSIS — E1165 Type 2 diabetes mellitus with hyperglycemia: Secondary | ICD-10-CM | POA: Diagnosis not present

## 2023-02-09 DIAGNOSIS — E782 Mixed hyperlipidemia: Secondary | ICD-10-CM

## 2023-02-09 DIAGNOSIS — Z794 Long term (current) use of insulin: Secondary | ICD-10-CM | POA: Diagnosis not present

## 2023-02-09 MED ORDER — DEXCOM G7 SENSOR MISC: 1.0000 | 0 refills | Status: DC

## 2023-02-09 MED ORDER — DEXCOM G7 RECEIVER DEVI: 1.0000 | 0 refills | Status: DC

## 2023-02-09 NOTE — Telephone Encounter (Signed)
Pharmacy Patient Advocate Encounter   Received notification from Pt Calls Messages that prior authorization for Dexcom G7 sensor is required/requested.   Insurance verification completed.   The patient is insured through [facility] .   Per test claim: PA required; PA submitted to above mentioned insurance via CoverMyMeds Key/confirmation #/EOC [id number] Status is pending

## 2023-02-09 NOTE — Patient Instructions (Signed)

## 2023-02-09 NOTE — Progress Notes (Signed)
Outpatient Endocrinology Note Latoya [place], MD  02/09/23   Latoya Walsh 18-Jun-1960 [id number]  Referring Provider: Caesar Bookman, NP Primary Care Provider: Caesar Bookman, NP Reason for consultation: Subjective   Assessment & Plan  Diagnoses and all orders for this visit:  Uncontrolled type 2 diabetes mellitus with hyperglycemia (HCC)  Long-term insulin use (HCC)  Mixed hypercholesterolemia and hypertriglyceridemia  Other orders -     Continuous Glucose Receiver (DEXCOM G7 RECEIVER) DEVI; 1 Device by Does not apply route continuous. -     Continuous Glucose Sensor (DEXCOM G7 SENSOR) MISC; 1 Device by Does not apply route continuous.    Diabetes Type II complicated by neuropathy,  Lab Results  Component Value Date   GFR 7.11 (LL) 01/07/2023   Hba1c goal less than 7, current Hba1c is  Lab Results  Component Value Date   HGBA1C 9.4 (A) 01/07/2023   Will recommend the following: Lantus 46 units qam (shift to mornings to improve compliance)  Ordered libre and dexcom, pt did not pick up, says she is "scared" Ordered DM education previosuly Applied dexcom today   No known contraindications/side effects to any of above medications  -Last LD and Tg are as follows: Lab Results  Component Value Date   LDLCALC 47 01/07/2023    Lab Results  Component Value Date   TRIG 258.0 (H) 01/07/2023   -On rosuvastatin 10 mg QD -Follow low fat diet and exercise   -Blood pressure goal <140/90 - Microalbumin/creatinine goal is < 30 -Last MA/Cr is as follows: Lab Results  Component Value Date   MICROALBUR 69.5 (H) 01/07/2023   -on ACE/ARB losartan 50 mg qd -diet changes including salt restriction -limit eating outside -counseled BP targets per standards of diabetes care -uncontrolled blood pressure can lead to retinopathy, nephropathy and cardiovascular and atherosclerotic heart disease  Reviewed and counseled on: -A1C target -Blood sugar  targets -Complications of uncontrolled diabetes  -Checking blood sugar before meals and bedtime and bring log next visit -All medications with mechanism of action and side effects -Hypoglycemia management: rule of 15's, Glucagon Emergency Kit and medical alert ID -low-carb low-fat plate-method diet -At least 20 minutes of physical activity per day -Annual dilated retinal eye exam and foot exam -compliance and follow up needs -follow up as scheduled or earlier if problem gets worse  Call if blood sugar is less than 70 or consistently above 250    Take a 15 gm snack of carbohydrate at bedtime before you go to sleep if your blood sugar is less than 100.    If you are going to fast after midnight for a test or procedure, ask your physician for instructions on how to reduce/decrease your insulin dose.    Call if blood sugar is less than 70 or consistently above 250  -Treating a low sugar by rule of 15  (15 gms of sugar every 15 min until sugar is more than 70) If you feel your sugar is low, test your sugar to be sure If your sugar is low (less than 70), then take 15 grams of a fast acting Carbohydrate (3-4 glucose tablets or glucose gel or 4 ounces of juice or regular soda) Recheck your sugar 15 min after treating low to make sure it is more than 70 If sugar is still less than 70, treat again with 15 grams of carbohydrate          Don't drive the hour of hypoglycemia  If unconscious/unable to eat  or drink by mouth, use glucagon injection or nasal spray baqsimi and call 911. Can repeat again in 15 min if still unconscious.  Return in about 8 days (around 02/17/2023) for 9:20 am for dexcom download .   I have reviewed current medications, nurse's notes, allergies, vital signs, past medical and surgical history, family medical history, and social history for this encounter. Counseled patient on symptoms, examination findings, lab findings, imaging results, treatment decisions and monitoring and  prognosis. The patient understood the recommendations and agrees with the treatment plan. All questions regarding treatment plan were fully answered.  Latoya [place], MD  02/09/23  History of Present Illness Latoya SCHELLINGER is a 62 y.o. year old female who presents for follow up of Type II diabetes mellitus.  ABISHAI POSS was first diagnosed around 2019.   Diabetes education ?  Home diabetes regimen: Lantus 42 units qam  COMPLICATIONS -  MI/Stroke -  retinopathy +  neuropathy +  nephropathy ESRD on HD M/W/F  BLOOD SUGAR DATA Check fasting: 110-228  Physical Exam  BP 132/82   Pulse 90   Ht 5\' 7"  (1.702 m)   Wt 164 lb 3.2 oz (74.5 kg)   LMP 06/23/2010   SpO2 97%   BMI 25.72 kg/m    Constitutional: well developed, well nourished Head: normocephalic, atraumatic Eyes: sclera anicteric, no redness Neck: supple Lungs: normal respiratory effort Neurology: alert and oriented Skin: dry, no appreciable rashes Musculoskeletal: no appreciable defects Psychiatric: normal mood and affect Diabetic Foot Exam - Simple   No data filed      Current Medications Patient's Medications  New Prescriptions   CONTINUOUS GLUCOSE RECEIVER (DEXCOM G7 RECEIVER) DEVI    1 Device by Does not apply route continuous.   CONTINUOUS GLUCOSE SENSOR (DEXCOM G7 SENSOR) MISC    1 Device by Does not apply route continuous.  Previous Medications   ACCU-CHEK SOFTCLIX LANCETS LANCETS    USE TO TEST BLOOD SUGAR TWICE DAILY AS DIRECTED *REFILL REQUEST*   ALBUTEROL (VENTOLIN HFA) 108 (90 BASE) MCG/ACT INHALER    Inhale 2 puffs into the lungs every 4 (four) hours as needed for wheezing or shortness of breath.   AMLODIPINE (NORVASC) 10 MG TABLET    Take 10 mg by mouth daily.   BLOOD GLUCOSE MONITORING SUPPL (ONETOUCH VERIO FLEX SYSTEM) W/DEVICE KIT    USE TO TEST BLOOD SUGAR TWICE DAILY AS DIRECTED *REFILL REQUEST*   BUSPIRONE (BUSPAR) 5 MG TABLET    Take 1 tablet (5 mg total) by mouth 2 (two) times daily.    COMFORT EZ PEN NEEDLES 32G X 4 MM MISC    USE AS DIRECTED with insulin injections   CONTINUOUS GLUCOSE SENSOR (FREESTYLE LIBRE 3 SENSOR) MISC    1 Device by Does not apply route continuous. Place 1 sensor on the skin every 14 days. Use to check glucose continuously   D3-1000 25 MCG (1000 UT) CAPSULE    Take 1,000 Units by mouth daily.   INSULIN GLARGINE (LANTUS SOLOSTAR) 100 UNIT/ML SOLOSTAR PEN    Inject 38 Units into the skin at bedtime.   INSULIN PEN NEEDLE (PEN NEEDLES) 31G X 5 MM MISC    Use to inject insulin once daily   METOPROLOL SUCCINATE (TOPROL-XL) 50 MG 24 HR TABLET    TAKE ONE TABLET BY MOUTH EVERY EVENING   MIRTAZAPINE (REMERON) 15 MG TABLET    TAKE ONE TABLET BY MOUTH EVERY EVENING   ROSUVASTATIN (CRESTOR) 10 MG TABLET    Take  1 tablet (10 mg total) by mouth every morning.   SEVELAMER CARBONATE (RENVELA) 800 MG TABLET    Take 800 mg by mouth 3 (three) times daily with meals.  Modified Medications   No medications on file  Discontinued Medications   No medications on file    Allergies Allergies  Allergen Reactions   Penicillins Itching and Rash    Past Medical History Past Medical History:  Diagnosis Date   Allergic rhinitis    Anxiety    CHF (congestive heart failure) (HCC)    a. EF 30-35% by echo in 05/2020 b. EF at 45% in 09/2020   Chronic back pain    Chronic hepatitis C without hepatic coma (HCC)    COVID-19 virus infection 12/26/2019   Depression    Diabetes mellitus    Hepatitis C    Hypertension    Noncompliance    Poor appetite 07/17/2014   Substance abuse (HCC)    HX of drug use and alcohol use    Past Surgical History Past Surgical History:  Procedure Laterality Date   APPENDECTOMY  2004   AV FISTULA PLACEMENT Left 11/26/2020   Procedure: LEFT ARM ARTERIOVENOUS (AV) FISTULA CREATION;  Surgeon: Larina Earthly, MD;  Location: [facility];  Service: Vascular;  Laterality: Left;   AV FISTULA PLACEMENT Left 06/03/2021   Procedure: INSERTION OF LEFT UPPER  ARM ARTERIOVENOUS (AV) GORE-TEX GRAFT;  Surgeon: Larina Earthly, MD;  Location: [facility];  Service: Vascular;  Laterality: Left;   EXCHANGE OF A DIALYSIS CATHETER Right 02/24/2022   Procedure: EXCHANGE OF A DIALYSIS CATHETER;  Surgeon: Larina Earthly, MD;  Location: [facility];  Service: Vascular;  Laterality: Right;   IR FLUORO GUIDE CV LINE RIGHT  12/25/2020   IR US GUIDE VASC ACCESS RIGHT  12/25/2020   LIGATION ARTERIOVENOUS GORTEX GRAFT Left 08/05/2021   Procedure: LIGATION OF LEFT ARM ARTERIOVENOUS GORTEX GRAFT;  Surgeon: Larina Earthly, MD;  Location: [facility];  Service: Vascular;  Laterality: Left;    Family History family history includes Diabetes in her sister.  Social History Social History   Socioeconomic History   Marital status: Single    Spouse name: Not on file   Number of children: 3   Years of education: Not on file   Highest education level: Not on file  Occupational History   Occupation: Disabled  Tobacco Use   Smoking status: Light Smoker    Current packs/day: 0.25    Types: Cigarettes   Smokeless tobacco: Never  Vaping Use   Vaping status: Never Used  Substance and Sexual Activity   Alcohol use: Not Currently    Alcohol/week: 40.0 standard drinks of alcohol    Types: 40 Cans of beer per week    Comment: 2 16oz cans of beer a day   Drug use: Not Currently    Types: Marijuana, Cocaine    Comment: cant rememeber last time used.   Sexual activity: Yes    Birth control/protection: Condom  Other Topics Concern   Not on file  Social History Narrative   Not on file   Social Determinants of Health   Financial Resource Strain: Low Risk  (07/26/2020)   Overall Financial Resource Strain (CARDIA)    Difficulty of Paying Living Expenses: Not hard at all  Food Insecurity: No Food Insecurity (12/30/2022)   Hunger Vital Sign    Worried About Running Out of Food in the Last Year: Never true    Ran Out of Food  in the Last Year: Never true  Transportation Needs: Unmet  Transportation Needs (01/28/2023)   PRAPARE - Administrator, Civil Service (Medical): Yes    Lack of Transportation (Non-Medical): No  Physical Activity: Inactive (12/19/2020)   Exercise Vital Sign    Days of Exercise per Week: 0 days    Minutes of Exercise per Session: 0 min  Stress: Stress Concern Present (08/21/2021)   [employer] of Occupational Health - Occupational Stress Questionnaire    Feeling of Stress : Rather much  Social Connections: Moderately Integrated (12/19/2020)   Social Connection and Isolation Panel [NHANES]    Frequency of Communication with Friends and Family: More than three times a week    Frequency of Social Gatherings with Friends and Family: Once a week    Attends Religious Services: More than 4 times per year    Active Member of Clubs or Organizations: Yes    Attends Banker Meetings: More than 4 times per year    Marital Status: Separated  Intimate Partner Violence: Not At Risk (09/30/2022)   Humiliation, Afraid, Rape, and Kick questionnaire    Fear of Current or Ex-Partner: No    Emotionally Abused: No    Physically Abused: No    Sexually Abused: No    Lab Results  Component Value Date   HGBA1C 9.4 (A) 01/07/2023   HGBA1C 8.3 (H) 08/18/2022   HGBA1C 11.9 05/08/2022   Lab Results  Component Value Date   CHOL 129 01/07/2023   Lab Results  Component Value Date   HDL 30.10 (L) 01/07/2023   Lab Results  Component Value Date   LDLCALC 47 01/07/2023   Lab Results  Component Value Date   TRIG 258.0 (H) 01/07/2023   Lab Results  Component Value Date   CHOLHDL 4 01/07/2023   Lab Results  Component Value Date   CREATININE 6.04 (HH) 01/07/2023   Lab Results  Component Value Date   GFR 7.11 (LL) 01/07/2023   Lab Results  Component Value Date   MICROALBUR 69.5 (H) 01/07/2023      Component Value Date/Time   NA 139 01/07/2023 1439   NA 135 (A) 04/30/2022 0000   K 4.5 01/07/2023 1439   CL 100 01/07/2023 1439    CO2 26 01/07/2023 1439   GLUCOSE 359 (H) 01/07/2023 1439   BUN 35 (H) 01/07/2023 1439   BUN 50 (A) 05/08/2022 0000   CREATININE 6.04 (HH) 01/07/2023 1439   CREATININE 6.06 (H) 01/08/2022 0924   CALCIUM 9.3 01/07/2023 1439   PROT 7.4 01/07/2023 1439   PROT 6.2 05/02/2020 1535   ALBUMIN 4.1 01/07/2023 1439   ALBUMIN 3.3 (L) 05/02/2020 1535   AST 9 01/07/2023 1439   ALT 10 01/07/2023 1439   ALT 65 (H) 12/04/2013 1449   ALKPHOS 88 01/07/2023 1439   BILITOT 0.4 01/07/2023 1439   BILITOT <0.2 05/02/2020 1535   BILITOT 0.3 12/04/2013 1449   GFRNONAA 8 (L) 02/24/2022 0843   GFRNONAA 39 (L) 03/28/2019 1056   GFRAA 31 (L) 05/02/2020 1535   GFRAA 45 (L) 03/28/2019 1056      Latest Ref Rng & Units 01/07/2023    2:39 PM 05/08/2022   12:00 AM 04/30/2022   12:00 AM  BMP  Glucose 70 - 99 mg/dL 213     BUN 6 - 23 mg/dL 35  50     36      Creatinine 0.40 - 1.20 mg/dL 0.86   7.8  Sodium 135 - 145 mEq/L 139   135      Potassium 3.5 - 5.1 mEq/L 4.5   4.8      Chloride 96 - 112 mEq/L 100   99      CO2 19 - 32 mEq/L 26     Calcium 8.4 - 10.5 mg/dL 9.3   9.4         This result is from an external source.       Component Value Date/Time   WBC 6.3 04/30/2022 0000   WBC 6.2 01/20/2022 1350   RBC 4.75 04/30/2022 0000   HGB 12.8 04/30/2022 0000   HGB 10.5 (L) 01/05/2020 1132   HCT 41 04/30/2022 0000   HCT 31.9 (L) 01/05/2020 1132   PLT 142 (A) 04/30/2022 0000   PLT 191 01/05/2020 1132   MCV 73.5 (L) 01/20/2022 1350   MCV 90 01/05/2020 1132   MCH 20.6 (L) 01/20/2022 1350   MCHC 28.1 (L) 01/20/2022 1350   RDW 22.8 (H) 01/20/2022 1350   RDW 12.7 01/05/2020 1132   LYMPHSABS 2.5 01/20/2022 1350   MONOABS 0.6 01/20/2022 1350   EOSABS 0.1 01/20/2022 1350   BASOSABS 0.0 01/20/2022 1350     Parts of this note may have been dictated using voice recognition software. There may be variances in spelling and vocabulary which are unintentional. Not all errors are proofread. Please notify the  Thereasa Parkin if any discrepancies are noted or if the meaning of any statement is not clear.

## 2023-02-11 ENCOUNTER — Other Ambulatory Visit: Payer: Medicaid Other

## 2023-02-11 DIAGNOSIS — N186 End stage renal disease: Secondary | ICD-10-CM | POA: Diagnosis not present

## 2023-02-11 DIAGNOSIS — I120 Hypertensive chronic kidney disease with stage 5 chronic kidney disease or end stage renal disease: Secondary | ICD-10-CM | POA: Diagnosis not present

## 2023-02-11 DIAGNOSIS — E785 Hyperlipidemia, unspecified: Secondary | ICD-10-CM | POA: Diagnosis not present

## 2023-02-11 DIAGNOSIS — E1169 Type 2 diabetes mellitus with other specified complication: Secondary | ICD-10-CM | POA: Diagnosis not present

## 2023-02-12 LAB — COMPLETE METABOLIC PANEL WITH GFR
AG Ratio: 1.2 (calc) (ref 1.0–2.5)
ALT: 15 U/L (ref 6–29)
AST: 15 U/L (ref 10–35)
Albumin: 4.3 g/dL (ref 3.6–5.1)
Alkaline phosphatase (APISO): 104 U/L (ref 37–153)
BUN/Creatinine Ratio: 3 (calc) — ABNORMAL LOW (ref 6–22)
BUN: 16 mg/dL (ref 7–25)
CO2: 28 mmol/L (ref 20–32)
Calcium: 8.9 mg/dL (ref 8.6–10.4)
Chloride: 96 mmol/L — ABNORMAL LOW (ref 98–110)
Creat: 5.67 mg/dL — ABNORMAL HIGH (ref 0.50–1.03)
Globulin: 3.7 g/dL (ref 1.9–3.7)
Glucose, Bld: 115 mg/dL — ABNORMAL HIGH (ref 65–99)
Potassium: 4.1 mmol/L (ref 3.5–5.3)
Sodium: 138 mmol/L (ref 135–146)
Total Bilirubin: 0.5 mg/dL (ref 0.2–1.2)
Total Protein: 8 g/dL (ref 6.1–8.1)
eGFR: 8 mL/min/{1.73_m2} — ABNORMAL LOW (ref >=60)

## 2023-02-12 LAB — HEMOGLOBIN A1C
Hgb A1c MFr Bld: 9.5 %{Hb} — ABNORMAL HIGH (ref <5.7)
Mean Plasma Glucose: 226 mg/dL
eAG (mmol/L): 12.5 mmol/L

## 2023-02-12 LAB — CBC WITH DIFFERENTIAL/PLATELET
Absolute Lymphocytes: 3967 {cells}/uL — ABNORMAL HIGH (ref 850–3900)
Absolute Monocytes: 592 {cells}/uL (ref 200–950)
Basophils Absolute: 85 {cells}/uL (ref 0–200)
Basophils Relative: 0.9 %
Eosinophils Absolute: 235 {cells}/uL (ref 15–500)
Eosinophils Relative: 2.5 %
HCT: 42.2 % (ref 35.0–45.0)
Hemoglobin: 13.9 g/dL (ref 11.7–15.5)
MCH: 30.6 pg (ref 27.0–33.0)
MCHC: 32.9 g/dL (ref 32.0–36.0)
MCV: 93 fL (ref 80.0–100.0)
MPV: 11.6 fL (ref 7.5–12.5)
Monocytes Relative: 6.3 %
Neutro Abs: 4521 {cells}/uL (ref 1500–7800)
Neutrophils Relative %: 48.1 %
Platelets: 218 10*3/uL (ref 140–400)
RBC: 4.54 10*6/uL (ref 3.80–5.10)
RDW: 13.8 % (ref 11.0–15.0)
Total Lymphocyte: 42.2 %
WBC: 9.4 10*3/uL (ref 3.8–10.8)

## 2023-02-12 LAB — LIPID PANEL
Cholesterol: 124 mg/dL (ref <200)
HDL: 33 mg/dL — ABNORMAL LOW (ref >=50)
LDL Cholesterol (Calc): 63 mg/dL
Non-HDL Cholesterol (Calc): 91 mg/dL (ref <130)
Total CHOL/HDL Ratio: 3.8 (calc) (ref <5.0)
Triglycerides: 223 mg/dL — ABNORMAL HIGH (ref <150)

## 2023-02-12 LAB — TSH: TSH: 2.69 m[IU]/L (ref 0.40–4.50)

## 2023-02-15 ENCOUNTER — Ambulatory Visit: Payer: Medicaid Other | Admitting: Family

## 2023-02-15 NOTE — Telephone Encounter (Signed)
Pharmacy Patient Advocate Encounter  Received notification from [facility] that Prior Authorization for Dexcom G7 sensor has been APPROVED from 02/09/2023 to 08/09/2023   PA #/Case ID/Reference #: [id number]

## 2023-02-16 ENCOUNTER — Ambulatory Visit: Payer: Medicaid Other | Admitting: "Endocrinology

## 2023-02-16 ENCOUNTER — Other Ambulatory Visit: Payer: Self-pay | Admitting: *Deleted

## 2023-02-16 NOTE — Patient Outreach (Signed)
Medicaid Managed Care   Nurse Care Manager Note  02/16/2023 Name:  Latoya Walsh MRN:  [id number] DOB:  1960/05/27  Latoya Walsh is an 62 y.o. year old female who is a primary patient of [name], [name], NP.  [facility] Managed Care Coordination team was consulted for assistance with:    DMII  Ms. Whalon was given information about Medicaid Managed Care Coordination team services today. [name] Patient agreed to services and verbal consent obtained.  Engaged with patient by telephone for follow up visit in response to provider referral for case management and/or care coordination services.   Assessments/Interventions:  Review of past medical history, allergies, medications, health status, including review of consultants reports, laboratory and other test data, was performed as part of comprehensive evaluation and provision of chronic care management services.  SDOH (Social Determinants of Health) assessments and interventions performed: SDOH Interventions    Flowsheet Row Patient Outreach Telephone from 02/16/2023 in [place] POPULATION HEALTH DEPARTMENT Patient Outreach Telephone from 01/28/2023 in [place] POPULATION HEALTH DEPARTMENT Patient Outreach Telephone from 12/30/2022 in [place] POPULATION HEALTH DEPARTMENT Patient Outreach Telephone from 09/30/2022 in [place] POPULATION HEALTH DEPARTMENT Patient Outreach Telephone from 04/28/2022 in [place] POPULATION HEALTH DEPARTMENT Patient Outreach Telephone from 03/03/2022 in [place] POPULATION HEALTH DEPARTMENT  SDOH Interventions        Food Insecurity Interventions -- -- Intervention Not Indicated Intervention Not Indicated Intervention Not Indicated --  Housing Interventions -- -- -- Intervention Not Indicated Intervention Not Indicated Intervention Not Indicated  Transportation Interventions Payor Benefit  [facility] assisted with arranging transportation with [facility] medical transportation] Payor Benefit  [RNCM  provided with [facility] medical [phone]] Payor Benefit -- Payor Benefit Intervention Not Indicated  Utilities Interventions -- -- -- -- Intervention Not Indicated --       Care Plan  Allergies  Allergen Reactions   Penicillins Itching and Rash    Medications Reviewed Today     Reviewed by [name], RN (Registered Nurse) on 02/16/23 at 1439  Med List Status: <None>   Medication Order Taking? Sig Documenting Provider Last Dose Status Informant  Accu-Chek Softclix Lancets lancets [id number] Yes USE TO TEST BLOOD SUGAR TWICE DAILY AS DIRECTED *REFILL REQUEST* [name], [name], NP Taking Active   albuterol (VENTOLIN HFA) 108 (90 Base) MCG/ACT inhaler [id number] Yes Inhale 2 puffs into the lungs every 4 (four) hours as needed for wheezing or shortness of breath. [name], DO Taking Active Self, Pharmacy Records  amLODipine (NORVASC) 10 MG tablet [id number] Yes Take 10 mg by mouth daily. [provider] Taking Active Self, Pharmacy Records           Med Note Normand Sloop, [name]   Thu Jan 01, 2022 10:10 AM)    Blood Glucose Monitoring Suppl (ONETOUCH VERIO FLEX SYSTEM) w/Device Andria Rhein [id number] Yes USE TO TEST BLOOD SUGAR TWICE DAILY AS DIRECTED *REFILL REQUEST* [name], [name], NP Taking Active   busPIRone (BUSPAR) 5 MG tablet [id number] Yes Take 1 tablet (5 mg total) by mouth 2 (two) times daily. [name], Donalee Citrin, NP Taking Active   COMFORT EZ PEN NEEDLES 32G X 4 MM MISC [id number] Yes USE AS DIRECTED with insulin injections [name], NP Taking Active Self, Pharmacy Records  Continuous Glucose Receiver [facility] G7 RECEIVER) DEVI [id number] Yes 1 Device by Does not apply route continuous. Altamese La [place], MD Taking Active   Continuous Glucose Sensor (DEXCOM G7 SENSOR) MISC [id number] Yes 1 Device by  Does not apply route continuous. Altamese Mucarabones, MD Taking Active   Continuous Glucose Sensor (FREESTYLE LIBRE 3 SENSOR) [place] [id number] No 1 Device by Does not  apply route continuous. Place 1 sensor on the skin every 14 days. Use to check glucose continuously  Patient not taking: Reported on 01/28/2023   Altamese Cayucos, MD Not Taking Active            Med Note (Karlea Mckibbin A   Thu Jan 28, 2023  9:09 AM) Has not picked up from the pharmacy  D3-1000 25 MCG (1000 UT) capsule [id number] Yes Take 1,000 Units by mouth daily. [provider] Taking Active   insulin glargine (LANTUS SOLOSTAR) 100 UNIT/ML Solostar Pen [id number] Yes Inject 38 Units into the skin at bedtime. [name], Donalee Citrin, NP Taking Active            Med Note (Fawaz Borquez A   Tue Feb 16, 2023  2:33 PM) Taking 48 units daily  Insulin Pen Needle (PEN NEEDLES) 31G X 5 MM MISC [id number] Yes Use to inject insulin once daily [name], NP Taking Active Self, Pharmacy Records  metoprolol succinate (TOPROL-XL) 50 MG 24 hr tablet [id number] Yes TAKE ONE TABLET BY MOUTH EVERY Ulice Bold, MD Taking Active   mirtazapine (REMERON) 15 MG tablet [id number] Yes TAKE ONE TABLET BY MOUTH EVERY EVENING [name], [name], NP Taking Active   rosuvastatin (CRESTOR) 10 MG tablet [id number] Yes Take 1 tablet (10 mg total) by mouth every morning. [name], [name], NP Taking Active   sevelamer carbonate (RENVELA) 800 MG tablet [id number] Yes Take 800 mg by mouth 3 (three) times daily with meals. [provider] Taking Active Self, Pharmacy Records           Med Note Normand Sloop, Deborra Medina Jan 01, 2022 10:12 AM)    Med List Note [name], [place] 55/73/22 0254):              Patient Active Problem List   Diagnosis Date Noted   Secondary hyperparathyroidism (HCC) 12/31/2022   ESRD on dialysis (HCC) 01/06/2021   CHF (congestive heart failure) (HCC) 06/06/2020   Leg swelling 04/28/2020   Hyperlipidemia LDL goal <100 04/28/2020   Left hand pain 03/10/2020   Anemia 02/19/2020   COVID-19 virus infection 12/26/2019   Depression, major, single episode, in partial  remission (HCC) 01/25/2019   Headache 11/13/2018   Insomnia 09/25/2018   Screening for colorectal cancer 07/23/2016   Lumbar back pain with radiculopathy affecting right lower extremity 03/27/2015   Non compliance with medical treatment 07/22/2014   Chronic hepatitis C without hepatic coma (HCC) 12/04/2013   Allergic rhinitis 11/03/2012   Anxiety and depression 02/05/2011   Nicotine dependence 11/01/2010   Dermatitis 10/30/2010   Drug dependence, continuous abuse (HCC) 03/22/2010   Uncontrolled type 2 diabetes mellitus with hyperglycemia (HCC) 06/02/2007   Alcohol abuse 06/02/2007    Conditions to be addressed/monitored per PCP order:  DMII  Care Plan : RN Care Manager Plan of Care  Updates made by [name], RN since 02/16/2023 12:00 AM     Problem: Health Management needs related to DMII and HF      Long-Range Goal: Development of Plan of Care to address Health Management needs related to DMII and HF   Start Date: 07/10/2021  Expected End Date: 03/23/2023  Priority: High  Note:   Current Barriers:  Chronic Disease Management support and education needs related to  CHF and DMII Ms. Lauersdorf has HD on M/W/F.  She missed Endocrinology follow up today due to transportation issues. She reports feeling better. She reports needing transportation to upcoming appointments. She has stopped her previous P[employer] agency and will be using a new agency.   RNCM Clinical Goal(s):  Patient will verbalize understanding of plan for management of CHF and DMII as evidenced by patient verbalization of self monitoring activities take all medications exactly as prescribed and will call provider for medication related questions as evidenced by documentation in EMR    attend all scheduled medical appointments: Dialysis on M/W/F, PCP on 02/23/23, Endocrinology on 02/25/23, Diabetic Education on 03/11/23 and [facility] on 03/18/23   as evidenced by provider documentation           Interventions: Inter-disciplinary care team collaboration (see longitudinal plan of care) Evaluation of current treatment plan related to  self management and patient's adherence to plan as established by provider Provided therapeutic listening   Diabetes:  (Status: Goal on Track (progressing): YES.) Long Term Goal  Lab Results  Component Value Date   HGBA1C 9.5 (H) 02/11/2023   @ Assessed patient's understanding of A1c goal: <8% Provided education to patient about basic DM disease process; Reviewed prescribed diet with patient diabetic; Counseled on importance of regular laboratory monitoring as prescribed;        Discussed plans with patient for ongoing care management follow up and provided patient with direct contact information for care management team;      Reviewed scheduled/upcoming provider appointments including:  12/3 with PCP, 12/5 with Endocrinology, 12/19 for Diabetic Education, 12/26 for Foot Care and 1/7 with [facility] ;         Review of patient status, including review of consultants reports, relevant laboratory and other test results, and medications completed;       Assessed social determinant of health barriers;        Encouragement provided Reviewed medications, discussed using CGM  Discussed the benefits of exercise, advised patient to exercise every day at least 30 minutes Assisted with rescheduling missed Endocrinology follow up visit-needs a Tuesday or Thursday appointment Assisted with scheduling transportation to PCP(Dinah) on 02/23/23-pick up from home at 8:45am and return ride home at 11am; Endocrinology([facility]) on 02/25/23-pick up from home at 8:25am and return ride home pick up at 10:30am; 03/11/23 for Diabetic Education-pick up from home at 8:35am and return ride home at 10:45am; [facility] on 03/18/23-pick up from home at 9:50am and return ride home at 12noon. Provided with [facility] medical transportation [phone] Discussed upcoming Eye exam  with [facility] on 05/20/23 at 2pm   Patient Goals/Self-Care Activities: Take medications as prescribed   Attend all scheduled provider appointments Call provider office for new concerns or questions  use salt in moderation eat more whole grains, fruits and vegetables, lean meats and healthy fats dress right for the weather, hot or cold Complete requested lab work       Follow Up:  Patient agrees to Care Plan and Follow-up.  Plan: The Managed Medicaid care management team will reach out to the patient again over the next 30 days.  Date/time of next scheduled RN care management/care coordination outreach:  03/22/23 at 2:30pm  Estanislado Emms RN, BSN [place]  Value-Based [facility] RN Care Coordinator [phone]

## 2023-02-16 NOTE — Patient Instructions (Signed)
Visit Information  Latoya Walsh was given information about Medicaid Managed Care team care coordination services as a part of their [facility] Community Plan Medicaid benefit. Latoya Walsh verbally consented to engagement with the [facility] Managed Care team.   If you are experiencing a medical emergency, please call 911 or report to your local emergency department or urgent care.   If you have a non-emergency medical problem during routine business hours, please contact your provider's office and ask to speak with a nurse.   For questions related to your [facility], please call: [phone] or visit the homepage here: [email or web address]  If you would like to schedule transportation through your [facility], please call the following number at least 2 days in advance of your appointment: [phone]   Rides for urgent appointments can also be made after hours by calling Member Services.  Call the Behavioral Health Crisis Line at [phone], at any time, 24 hours a day, 7 days a week. If you are in danger or need immediate medical attention call 911.  If you would like help to quit smoking, call 1-800-QUIT-NOW (209-883-0980) OR Espaol: 1-855-Djelo-Ya (0-347-425-9563) o para ms informacin haga clic aqu or Text READY to 875-643 to register via text  Latoya Walsh,   Please see education materials related to DM provided by MyChart link. 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   The patient verbalized understanding of instructions, educational materials, and care plan provided today and agreed to receive a mailed copy of patient instructions, educational materials, and care plan.   Telephone follow up appointment with Managed Medicaid care management team member scheduled for:03/22/23 at 2:30pm  Estanislado Emms RN, BSN [place]  Value-Based Care Institute [facility] RN Care  Coordinator [phone]   Following is a copy of your plan of care:  Care Plan : RN Care Manager Plan of Care  Updates made by [name], RN since 02/16/2023 12:00 AM     Problem: Health Management needs related to DMII and HF      Long-Range Goal: Development of Plan of Care to address Health Management needs related to DMII and HF   Start Date: 07/10/2021  Expected End Date: 03/23/2023  Priority: High  Note:   Current Barriers:  Chronic Disease Management support and education needs related to CHF and DMII Latoya Walsh has HD on M/W/F.  She missed Endocrinology follow up today due to transportation issues. She reports feeling better. She reports needing transportation to upcoming appointments. She has stopped her previous P[employer] agency and will be using a new agency.   RNCM Clinical Goal(s):  Patient will verbalize understanding of plan for management of CHF and DMII as evidenced by patient verbalization of self monitoring activities take all medications exactly as prescribed and will call provider for medication related questions as evidenced by documentation in EMR    attend all scheduled medical appointments: Dialysis on M/W/F, PCP on 02/23/23, Endocrinology on 02/25/23, Diabetic Education on 03/11/23 and [facility] on 03/18/23   as evidenced by provider documentation          Interventions: Inter-disciplinary care team collaboration (see longitudinal plan of care) Evaluation of current treatment plan related to  self management and patient's adherence to plan as established by provider Provided therapeutic listening   Diabetes:  (Status: Goal on Track (progressing): YES.) Long Term Goal  Lab Results  Component Value Date   HGBA1C 9.5 (H) 02/11/2023   @ Assessed patient's  understanding of A1c goal: <8% Provided education to patient about basic DM disease process; Reviewed prescribed diet with patient diabetic; Counseled on importance of regular laboratory  monitoring as prescribed;        Discussed plans with patient for ongoing care management follow up and provided patient with direct contact information for care management team;      Reviewed scheduled/upcoming provider appointments including:  12/3 with PCP, 12/5 with Endocrinology, 12/19 for Diabetic Education, 12/26 for Foot Care and 1/7 with [facility] ;         Review of patient status, including review of consultants reports, relevant laboratory and other test results, and medications completed;       Assessed social determinant of health barriers;        Encouragement provided Reviewed medications, discussed using CGM  Discussed the benefits of exercise, advised patient to exercise every day at least 30 minutes Assisted with rescheduling missed Endocrinology follow up visit-needs a Tuesday or Thursday appointment Assisted with scheduling transportation to PCP(Dinah) on 02/23/23-pick up from home at 8:45am and return ride home at 11am; Endocrinology([facility]) on 02/25/23-pick up from home at 8:25am and return ride home pick up at 10:30am; 03/11/23 for Diabetic Education-pick up from home at 8:35am and return ride home at 10:45am; [facility] on 03/18/23-pick up from home at 9:50am and return ride home at 12noon. Provided with [facility] medical transportation [phone] Discussed upcoming Eye exam with [facility] on 05/20/23 at 2pm   Patient Goals/Self-Care Activities: Take medications as prescribed   Attend all scheduled provider appointments Call provider office for new concerns or questions  use salt in moderation eat more whole grains, fruits and vegetables, lean meats and healthy fats dress right for the weather, hot or cold Complete requested lab work

## 2023-02-20 DIAGNOSIS — N186 End stage renal disease: Secondary | ICD-10-CM | POA: Diagnosis not present

## 2023-02-20 DIAGNOSIS — Z992 Dependence on renal dialysis: Secondary | ICD-10-CM | POA: Diagnosis not present

## 2023-02-20 DIAGNOSIS — E1122 Type 2 diabetes mellitus with diabetic chronic kidney disease: Secondary | ICD-10-CM | POA: Diagnosis not present

## 2023-02-22 ENCOUNTER — Other Ambulatory Visit: Payer: Self-pay | Admitting: Family

## 2023-02-22 ENCOUNTER — Other Ambulatory Visit: Payer: Self-pay | Admitting: Nurse Practitioner

## 2023-02-23 ENCOUNTER — Encounter: Payer: Self-pay | Admitting: Family

## 2023-02-23 ENCOUNTER — Ambulatory Visit (INDEPENDENT_AMBULATORY_CARE_PROVIDER_SITE_OTHER): Payer: Medicaid Other | Admitting: Family

## 2023-02-23 VITALS — BP 128/78 | HR 85 | Temp 97.9°F | Resp 18 | Ht 67.0 in | Wt 162.4 lb

## 2023-02-23 DIAGNOSIS — E1169 Type 2 diabetes mellitus with other specified complication: Secondary | ICD-10-CM | POA: Diagnosis not present

## 2023-02-23 DIAGNOSIS — D631 Anemia in chronic kidney disease: Secondary | ICD-10-CM

## 2023-02-23 DIAGNOSIS — I120 Hypertensive chronic kidney disease with stage 5 chronic kidney disease or end stage renal disease: Secondary | ICD-10-CM

## 2023-02-23 DIAGNOSIS — F17209 Nicotine dependence, unspecified, with unspecified nicotine-induced disorders: Secondary | ICD-10-CM

## 2023-02-23 DIAGNOSIS — Z992 Dependence on renal dialysis: Secondary | ICD-10-CM

## 2023-02-23 DIAGNOSIS — F411 Generalized anxiety disorder: Secondary | ICD-10-CM | POA: Diagnosis not present

## 2023-02-23 DIAGNOSIS — Z1211 Encounter for screening for malignant neoplasm of colon: Secondary | ICD-10-CM

## 2023-02-23 DIAGNOSIS — E785 Hyperlipidemia, unspecified: Secondary | ICD-10-CM

## 2023-02-23 DIAGNOSIS — F1920 Other psychoactive substance dependence, uncomplicated: Secondary | ICD-10-CM

## 2023-02-23 DIAGNOSIS — N186 End stage renal disease: Secondary | ICD-10-CM

## 2023-02-23 MED ORDER — IRON (FERROUS SULFATE) 325 (65 FE) MG PO TABS: 325.0000 mg | ORAL_TABLET | Freq: Every day | ORAL | 3 refills | Status: DC

## 2023-02-23 MED ORDER — DOCUSATE SODIUM 100 MG PO CAPS: 100.0000 mg | ORAL_CAPSULE | Freq: Every day | ORAL | 5 refills | Status: DC | PRN

## 2023-02-23 NOTE — Progress Notes (Signed)
Provider: Richarda Blade FNP-C   Macguire Holsinger, Donalee Citrin, NP  Patient Care Team: Jovita Persing, Donalee Citrin, NP as PCP - General (Family Medicine) Wyline Mood [name], MD as PCP - Cardiology (Cardiology) [name], RN as Case Manager  Extended Emergency Contact Information Primary Emergency Contact: Flippen,Shavon Mobile Phone: [phone] Relation: Daughter Secondary Emergency Contact: Fountain,Chandy Mobile Phone: [phone] Relation: Sister  Code Status:  Full Code Goals of care: Advanced Directive information    12/31/2022   10:34 AM  Advanced Directives  Does Patient Have a Medical Advance Directive? No     Chief Complaint  Patient presents with   Medical Management of Chronic Issues     6 month follow-up   Immunizations    Shingrix and Covid   Health Maintenance    Cologuard    HPI:  Pt is a 62 y.o. female seen today for 6 months follow up for medical management of chronic diseases.states has a CNA that assist her with the ADL's. No fall episode since last visit. Also no recent hospitalization or weight changes.     Type 2 DM - brought in her CBG log readings in the 120's -180's this morning was 216 states has been using Lantus 46 units at bedtime.recent A1C 9.5 has upcoming appointment with Endocrinologist 02/25/2023.  Also has appointment with podiatrist on 03/18/2023.  Has some tingling on both hands.   Anxiety - states feeling more anxious today did not take her buspar.medication effective whenever she takes it.   Hypertension - no home B/p readings for evaluation.sometimes has headache.she denies any dizziness,vision changes,fatigue,chest tightness,palpitation,chest pain or shortness of breath.    ESRD - on dialysis three times per week on Mon -Wed and Friday.voids once daily.    Hyperlipidemia - TC 129,TRG 258,LDL 47  Anemia due to chronic disease - Hgb 9.4 improved from previous 8.3 states feels tired all the time.    Past Medical History:  Diagnosis  Date   Allergic rhinitis    Anxiety    CHF (congestive heart failure) (HCC)    a. EF 30-35% by echo in 05/2020 b. EF at 45% in 09/2020   Chronic back pain    Chronic hepatitis C without hepatic coma (HCC)    COVID-19 virus infection 12/26/2019   Depression    Diabetes mellitus    Hepatitis C    Hypertension    Noncompliance    Poor appetite 07/17/2014   Substance abuse (HCC)    HX of drug use and alcohol use   Past Surgical History:  Procedure Laterality Date   APPENDECTOMY  2004   AV FISTULA PLACEMENT Left 11/26/2020   Procedure: LEFT ARM ARTERIOVENOUS (AV) FISTULA CREATION;  Surgeon: Larina Earthly, MD;  Location: [facility];  Service: Vascular;  Laterality: Left;   AV FISTULA PLACEMENT Left 06/03/2021   Procedure: INSERTION OF LEFT UPPER ARM ARTERIOVENOUS (AV) GORE-TEX GRAFT;  Surgeon: Larina Earthly, MD;  Location: [facility];  Service: Vascular;  Laterality: Left;   EXCHANGE OF A DIALYSIS CATHETER Right 02/24/2022   Procedure: EXCHANGE OF A DIALYSIS CATHETER;  Surgeon: Larina Earthly, MD;  Location: [facility];  Service: Vascular;  Laterality: Right;   IR FLUORO GUIDE CV LINE RIGHT  12/25/2020   IR US GUIDE VASC ACCESS RIGHT  12/25/2020   LIGATION ARTERIOVENOUS GORTEX GRAFT Left 08/05/2021   Procedure: LIGATION OF LEFT ARM ARTERIOVENOUS GORTEX GRAFT;  Surgeon: Larina Earthly, MD;  Location: [facility];  Service: Vascular;  Laterality: Left;  Allergies  Allergen Reactions   Penicillins Itching and Rash    Allergies as of 02/23/2023       Reactions   Penicillins Itching, Rash        Medication List        Accurate as of February 23, 2023  9:50 AM. If you have any questions, ask your nurse or doctor.          Accu-Chek Guide Test test strip Generic drug: glucose blood USE TO TEST BLOOD SUGAR TWICE DAILY   Accu-Chek Softclix Lancets lancets USE TO TEST BLOOD SUGAR TWICE DAILY AS DIRECTED *REFILL REQUEST*   albuterol 108 (90 Base) MCG/ACT inhaler Commonly known as: VENTOLIN  HFA Inhale 2 puffs into the lungs every 4 (four) hours as needed for wheezing or shortness of breath.   amLODipine 10 MG tablet Commonly known as: NORVASC Take 10 mg by mouth daily.   busPIRone 5 MG tablet Commonly known as: BUSPAR Take 1 tablet (5 mg total) by mouth 2 (two) times daily.   D3-1000 25 MCG (1000 UT) capsule Generic drug: Cholecalciferol Take 1,000 Units by mouth daily.   Dexcom G7 Receiver Devi 1 Device by Does not apply route continuous.   FreeStyle Libre 3 Sensor Misc 1 Device by Does not apply route continuous. Place 1 sensor on the skin every 14 days. Use to check glucose continuously   Dexcom G7 Sensor Misc 1 Device by Does not apply route continuous.   Lantus SoloStar 100 UNIT/ML Solostar Pen Generic drug: insulin glargine Inject 38 Units into the skin at bedtime.   metoprolol succinate 50 MG 24 hr tablet Commonly known as: TOPROL-XL TAKE ONE TABLET BY MOUTH EVERY EVENING   mirtazapine 15 MG tablet Commonly known as: REMERON TAKE ONE TABLET BY MOUTH EVERY EVENING   OneTouch Verio Flex System w/Device Kit USE TO TEST BLOOD SUGAR TWICE DAILY AS DIRECTED *REFILL REQUEST*   Pen Needles 31G X 5 MM Misc Use to inject insulin once daily   Comfort EZ Pen Needles 32G X 4 MM Misc Generic drug: Insulin Pen Needle USE AS DIRECTED with insulin injections   rosuvastatin 10 MG tablet Commonly known as: CRESTOR Take 1 tablet (10 mg total) by mouth every morning.   sevelamer carbonate 800 MG tablet Commonly known as: RENVELA Take 800 mg by mouth 3 (three) times daily with meals.        Review of Systems  Constitutional:  Negative for appetite change, chills, fatigue, fever and unexpected weight change.  HENT:  Negative for congestion, dental problem, ear discharge, ear pain, facial swelling, hearing loss, nosebleeds, postnasal drip, rhinorrhea, sinus pressure, sinus pain, sneezing, sore throat, tinnitus and trouble swallowing.   Eyes:  Negative for  pain, discharge, redness, itching and visual disturbance.  Respiratory:  Negative for cough, chest tightness, shortness of breath and wheezing.   Cardiovascular:  Negative for chest pain, palpitations and leg swelling.  Gastrointestinal:  Negative for abdominal distention, abdominal pain, blood in stool, constipation, diarrhea, nausea and vomiting.  Endocrine: Negative for cold intolerance, heat intolerance, polydipsia, polyphagia and polyuria.  Genitourinary:  Negative for difficulty urinating, dysuria, flank pain, frequency and urgency.       Dialysis M-W-F   Musculoskeletal:  Negative for arthralgias, back pain, gait problem, joint swelling, myalgias, neck pain and neck stiffness.  Skin:  Negative for color change, pallor, rash and wound.  Neurological:  Negative for dizziness, syncope, speech difficulty, weakness, light-headedness and numbness.       Sometimes has  headaches but none today   Hematological:  Does not bruise/bleed easily.  Psychiatric/Behavioral:  Negative for agitation, behavioral problems, confusion, hallucinations, self-injury, sleep disturbance and suicidal ideas. The patient is nervous/anxious.     Immunization History  Administered Date(s) Administered   H1N1 04/04/2008   Hepb-cpg 02/07/2021, 03/14/2021, 04/09/2021, 06/04/2021   Influenza Split 02/05/2011, 12/02/2011   Influenza Whole 12/10/2006, 01/08/2009   Influenza, Quadrivalent, Recombinant, Inj, Pf 12/26/2021   Influenza,inj,Quad PF,6+ Mos 02/12/2014, 03/27/2015, 03/05/2020, 01/22/2021   PFIZER(Purple Top)SARS-COV-2 Vaccination 09/29/2019   Pneumococcal Conjugate-13 06/11/2014   Pneumococcal Polysaccharide-23 12/05/2004, 10/30/2010, 01/09/2022   Td 08/09/2003   Pertinent  Health Maintenance Due  Topic Date Due   OPHTHALMOLOGY EXAM  09/17/2021   HEMOGLOBIN A1C  08/11/2023   FOOT EXAM  12/31/2023   MAMMOGRAM  07/13/2024   INFLUENZA VACCINE  Completed      01/20/2022    1:14 PM 05/05/2022    8:58 AM  08/18/2022    9:20 AM 12/31/2022   10:36 AM 02/23/2023    9:25 AM  Fall Risk  Falls in the past year?  0 0 0 0  Was there an injury with Fall?  0 0  0  Fall Risk Category Calculator  0 0  0  (RETIRED) Patient Fall Risk Level Low fall risk      Patient at Risk for Falls Due to  No Fall Risks No Fall Risks  No Fall Risks  Fall risk Follow up  Falls evaluation completed Falls evaluation completed  Falls evaluation completed   Functional Status Survey:    Vitals:   02/23/23 0926  BP: 128/78  Pulse: 85  Resp: 18  Temp: 97.9 F (36.6 C)  SpO2: 97%  Weight: 162 lb 6.4 oz (73.7 kg)  Height: 5\' 7"  (1.702 m)   Body mass index is 25.44 kg/m. Physical Exam Vitals reviewed.  Constitutional:      General: She is not in acute distress.    Appearance: Normal appearance. She is overweight. She is not ill-appearing or diaphoretic.  HENT:     Head: Normocephalic.     Right Ear: Tympanic membrane, ear canal and external ear normal. There is no impacted cerumen.     Left Ear: Tympanic membrane, ear canal and external ear normal. There is no impacted cerumen.     Nose: Nose normal. No congestion or rhinorrhea.     Mouth/Throat:     Mouth: Mucous membranes are dry.     Pharynx: Oropharynx is clear. No oropharyngeal exudate or posterior oropharyngeal erythema.  Eyes:     General: No scleral icterus.       Right eye: No discharge.        Left eye: No discharge.     Extraocular Movements: Extraocular movements intact.     Conjunctiva/sclera: Conjunctivae normal.     Pupils: Pupils are equal, round, and reactive to light.  Neck:     Vascular: No carotid bruit.  Cardiovascular:     Rate and Rhythm: Normal rate and regular rhythm.     Pulses: Normal pulses.     Heart sounds: Normal heart sounds. No murmur heard.    No friction rub. No gallop.     Comments: Right upper chest dialysis catheter dressing dry, clean and intact  Pulmonary:     Effort: Pulmonary effort is normal. No respiratory  distress.     Breath sounds: Normal breath sounds. No wheezing, rhonchi or rales.  Chest:     Chest wall: No tenderness.  Abdominal:     General: Bowel sounds are normal. There is no distension.     Palpations: Abdomen is soft. There is no mass.     Tenderness: There is no abdominal tenderness. There is no right CVA tenderness, left CVA tenderness, guarding or rebound.  Musculoskeletal:        General: No swelling or tenderness. Normal range of motion.     Cervical back: Normal range of motion. No rigidity or tenderness.     Right lower leg: No edema.     Left lower leg: No edema.  Lymphadenopathy:     Cervical: No cervical adenopathy.  Skin:    General: Skin is warm and dry.     Coloration: Skin is not pale.     Findings: No bruising, erythema, lesion or rash.  Neurological:     Mental Status: She is alert and oriented to person, place, and time.     Cranial Nerves: No cranial nerve deficit.     Sensory: No sensory deficit.     Motor: No weakness.     Coordination: Coordination normal.     Gait: Gait normal.  Psychiatric:        Mood and Affect: Mood normal.        Speech: Speech normal.        Behavior: Behavior normal.        Thought Content: Thought content normal.        Judgment: Judgment normal.     Labs reviewed: Recent Labs    02/24/22 0843 02/24/22 0843 04/30/22 0000 05/08/22 0000 01/07/23 1439 02/11/23 0805  NA 136  --  135*  --  139 138  K 4.8  --  4.8  --  4.5 4.1  CL 95*  --  99  --  100 96*  CO2 25  --   --   --  26 28  GLUCOSE 369*  --   --   --  359* 115*  BUN 34*   < > 36* 50* 35* 16  CREATININE 5.76*  --  7.8*  --  6.04* 5.67*  CALCIUM 9.4  --  9.4  --  9.3 8.9   < > = values in this interval not displayed.   Recent Labs    04/30/22 0000 01/07/23 1439 02/11/23 0805  AST  --  9 15  ALT  --  10 15  ALKPHOS 96 88  --   BILITOT  --  0.4 0.5  PROT  --  7.4 8.0  ALBUMIN  --  4.1  --    Recent Labs    02/24/22 0843 04/30/22 0000  02/11/23 0805  WBC  --  6.3 9.4  NEUTROABS  --   --  4,521  HGB 10.9* 12.8 13.9  HCT 37.3 41 42.2  MCV  --   --  93.0  PLT  --  142* 218   Lab Results  Component Value Date   TSH 2.69 02/11/2023   Lab Results  Component Value Date   HGBA1C 9.5 (H) 02/11/2023   Lab Results  Component Value Date   CHOL 124 02/11/2023   HDL 33 (L) 02/11/2023   LDLCALC 63 02/11/2023   TRIG 223 (H) 02/11/2023   CHOLHDL 3.8 02/11/2023    Significant Diagnostic Results in last 30 days:  No results found.  Assessment/Plan  1. Type 2 diabetes mellitus with hyperlipidemia [facility]) Lab Results  Component Value Date   HGBA1C 9.5 (H) 02/11/2023  -Continue on  Humalog and Lantus -Continue on statin -Follows up with endocrinology -Advised to continue to follow-up with ophthalmology and podiatrist  2. Benign hypertension with ESRD (end-stage renal disease) (HCC) Blood pressure well-controlled -Continue on metoprolol and amlodipine - TSH; Future - CBC with Differential/Platelet; Future  3. Hyperlipidemia LDL goal <100 LDL at goal -Continue on rosuvastatin - Lipid panel; Future  4. Generalized anxiety disorder Reports anxious this visit since she did not take her buspirone this morning.  Advised to take medication as directed.  Notify provider if symptoms worsen. - TSH; Future  5. Colon cancer screening Cologuard versus colonoscopy discussed patient prefers colonoscopy.  Will refer to gastroenterology.Made aware specialist office will call him to schedule appointment. - Ambulatory referral to Gastroenterology  6. Anemia due to chronic kidney disease, on chronic dialysis (HCC) Continue on ferrous sulfate.  Advised to take along with stool softener  to prevent constipation - Iron, Ferrous Sulfate, 325 (65 Fe) MG TABS; Take 325 mg by mouth daily.  Dispense: 30 tablet; Refill: 3 - CBC with Differential/Platelet; Future - docusate sodium (COLACE) 100 MG capsule; Take 1 capsule (100 mg total) by  mouth daily as needed for mild constipation.  Dispense: 10 capsule; Refill: 5  7. Tobacco use disorder, continuous Smoking cessation advised  8. Drug dependence, continuous abuse (HCC) Reports not on recent drug use.  Family/ staff Communication: Reviewed plan of care with patient verbalized understanding  Labs/tests ordered:  - Lipid panel; Future - TSH; Future - CBC with Differential/Platelet; Future  Next Appointment : Return in about 6 months (around 08/24/2023) for medical mangement of chronic issues., Fasting labs in 6 months prior to visit.   Caesar Bookman, NP

## 2023-02-25 ENCOUNTER — Telehealth: Payer: Self-pay

## 2023-02-25 ENCOUNTER — Ambulatory Visit (INDEPENDENT_AMBULATORY_CARE_PROVIDER_SITE_OTHER): Payer: Medicaid Other | Admitting: "Endocrinology

## 2023-02-25 ENCOUNTER — Encounter: Payer: Self-pay | Admitting: "Endocrinology

## 2023-02-25 VITALS — BP 124/74 | HR 90 | Ht 67.0 in | Wt 163.4 lb

## 2023-02-25 DIAGNOSIS — E782 Mixed hyperlipidemia: Secondary | ICD-10-CM

## 2023-02-25 DIAGNOSIS — E1165 Type 2 diabetes mellitus with hyperglycemia: Secondary | ICD-10-CM | POA: Diagnosis not present

## 2023-02-25 DIAGNOSIS — Z794 Long term (current) use of insulin: Secondary | ICD-10-CM

## 2023-02-25 MED ORDER — INSULIN LISPRO (1 UNIT DIAL) 100 UNIT/ML (KWIKPEN): 6.0000 [IU] | PEN_INJECTOR | Freq: Two times a day (BID) | SUBCUTANEOUS | 3 refills | Status: DC

## 2023-02-25 NOTE — Telephone Encounter (Signed)
Medication Samples have been provided to the patient.  Drug name: Novolog       Strength: 100        Qty: 1 vial  LOT: [id number]  Exp.Date: 05/21/23  Dosing instructions: 6 units into the skin twice daily  The patient has been instructed regarding the correct time, dose, and frequency of taking this medication, including desired effects and most common side effects.   Nickia Boesen A Velera Lansdale 11:10 AM 02/25/2023

## 2023-02-25 NOTE — Progress Notes (Signed)
Outpatient Endocrinology Note [facility], MD  02/25/23   Latoya Walsh [birth year] [id number]  Referring Provider: Caesar Bookman, NP Primary Care Provider: Caesar Bookman, NP Reason for consultation: Subjective   Assessment & Plan  Diagnoses and all orders for this visit:  Uncontrolled type 2 diabetes mellitus with hyperglycemia (HCC)  Long-term insulin use (HCC)  Mixed hypercholesterolemia and hypertriglyceridemia  Other orders -     insulin lispro (HUMALOG KWIKPEN) 100 UNIT/ML KwikPen; Inject 6 Units into the skin 2 (two) times daily.     Diabetes Type II complicated by neuropathy,  Lab Results  Component Value Date   GFR 7.11 (LL) 01/07/2023   Hba1c goal less than 7, current Hba1c is  Lab Results  Component Value Date   HGBA1C 9.5 (H) 02/11/2023   Will recommend the following: Lantus 48 units qam Humalog 6 units bidac before 9 am and 4 pm meals  Ordered libre and dexcom, pt did not pick up, says she is "scared" Ordered DM education previously Applied dexcom today   No known contraindications/side effects to any of above medications  -Last LD and Tg are as follows: Lab Results  Component Value Date   LDLCALC 63 02/11/2023    Lab Results  Component Value Date   TRIG 223 (H) 02/11/2023   -On rosuvastatin 10 mg QD -Follow low fat diet and exercise   -Blood pressure goal <140/90 - Microalbumin/creatinine goal is < 30 -Last MA/Cr is as follows: Lab Results  Component Value Date   MICROALBUR 69.5 (H) 01/07/2023   -on ACE/ARB losartan 50 mg qd -diet changes including salt restriction -limit eating outside -counseled BP targets per standards of diabetes care -uncontrolled blood pressure can lead to retinopathy, nephropathy and cardiovascular and atherosclerotic heart disease  Reviewed and counseled on: -A1C target -Blood sugar targets -Complications of uncontrolled diabetes  -Checking blood sugar before meals and bedtime and bring  log next visit -All medications with mechanism of action and side effects -Hypoglycemia management: rule of 15's, Glucagon Emergency Kit and medical alert ID -low-carb low-fat plate-method diet -At least 20 minutes of physical activity per day -Annual dilated retinal eye exam and foot exam -compliance and follow up needs -follow up as scheduled or earlier if problem gets worse  Call if blood sugar is less than 70 or consistently above 250    Take a 15 gm snack of carbohydrate at bedtime before you go to sleep if your blood sugar is less than 100.    If you are going to fast after midnight for a test or procedure, ask your physician for instructions on how to reduce/decrease your insulin dose.    Call if blood sugar is less than 70 or consistently above 250  -Treating a low sugar by rule of 15  (15 gms of sugar every 15 min until sugar is more than 70) If you feel your sugar is low, test your sugar to be sure If your sugar is low (less than 70), then take 15 grams of a fast acting Carbohydrate (3-4 glucose tablets or glucose gel or 4 ounces of juice or regular soda) Recheck your sugar 15 min after treating low to make sure it is more than 70 If sugar is still less than 70, treat again with 15 grams of carbohydrate          Don't drive the hour of hypoglycemia  If unconscious/unable to eat or drink by mouth, use glucagon injection or nasal spray baqsimi and  call 911. Can repeat again in 15 min if still unconscious.  Return in about 1 week (around 03/04/2023).   I have reviewed current medications, nurse's notes, allergies, vital signs, past medical and surgical history, family medical history, and social history for this encounter. Counseled patient on symptoms, examination findings, lab findings, imaging results, treatment decisions and monitoring and prognosis. The patient understood the recommendations and agrees with the treatment plan. All questions regarding treatment plan were fully  answered.  [facility] [place], MD  02/25/23  History of Present Illness Latoya Walsh is a 62 y.o. year old female who presents for follow up of Type II diabetes mellitus.  Latoya Walsh was first diagnosed around 2019.   Diabetes education ?  Home diabetes regimen: Lantus 46 units qam  COMPLICATIONS -  MI/Stroke -  retinopathy +  neuropathy +  nephropathy ESRD on HD M/W/F  BLOOD SUGAR DATA  CGM interpretation: At today's visit, we reviewed her CGM downloads. The full report is scanned in the media. Reviewing the CGM trends, BG are elevated in morning and evening.    Physical Exam  BP 124/74   Pulse 90   Ht 5\' 7"  (1.702 m)   Wt 163 lb 6.4 oz (74.1 kg)   LMP 06/23/2010   SpO2 99%   BMI 25.59 kg/m    Constitutional: well developed, well nourished Head: normocephalic, atraumatic Eyes: sclera anicteric, no redness Neck: supple Lungs: normal respiratory effort Neurology: alert and oriented Skin: dry, no appreciable rashes Musculoskeletal: no appreciable defects Psychiatric: normal mood and affect Diabetic Foot Exam - Simple   No data filed      Current Medications Patient's Medications  New Prescriptions   INSULIN LISPRO (HUMALOG KWIKPEN) 100 UNIT/ML KWIKPEN    Inject 6 Units into the skin 2 (two) times daily.  Previous Medications   ACCU-CHEK GUIDE TEST TEST STRIP    USE TO TEST BLOOD SUGAR TWICE DAILY   ACCU-CHEK SOFTCLIX LANCETS LANCETS    USE TO TEST BLOOD SUGAR TWICE DAILY AS DIRECTED *REFILL REQUEST*   ALBUTEROL (VENTOLIN HFA) 108 (90 BASE) MCG/ACT INHALER    Inhale 2 puffs into the lungs every 4 (four) hours as needed for wheezing or shortness of breath.   AMLODIPINE (NORVASC) 10 MG TABLET    Take 10 mg by mouth daily.   BLOOD GLUCOSE MONITORING SUPPL (ONETOUCH VERIO FLEX SYSTEM) W/DEVICE KIT    USE TO TEST BLOOD SUGAR TWICE DAILY AS DIRECTED *REFILL REQUEST*   BUSPIRONE (BUSPAR) 5 MG TABLET    Take 1 tablet (5 mg total) by mouth 2 (two) times daily.    COMFORT EZ PEN NEEDLES 32G X 4 MM MISC    USE AS DIRECTED with insulin injections   CONTINUOUS GLUCOSE RECEIVER (DEXCOM G7 RECEIVER) DEVI    1 Device by Does not apply route continuous.   CONTINUOUS GLUCOSE SENSOR (DEXCOM G7 SENSOR) MISC    1 Device by Does not apply route continuous.   CONTINUOUS GLUCOSE SENSOR (FREESTYLE LIBRE 3 SENSOR) MISC    1 Device by Does not apply route continuous. Place 1 sensor on the skin every 14 days. Use to check glucose continuously   D3-1000 25 MCG (1000 UT) CAPSULE    Take 1,000 Units by mouth daily.   DOCUSATE SODIUM (COLACE) 100 MG CAPSULE    Take 1 capsule (100 mg total) by mouth daily as needed for mild constipation.   INSULIN GLARGINE (LANTUS SOLOSTAR) 100 UNIT/ML SOLOSTAR PEN    Inject 38 Units  into the skin at bedtime.   INSULIN PEN NEEDLE (PEN NEEDLES) 31G X 5 MM MISC    Use to inject insulin once daily   IRON, FERROUS SULFATE, 325 (65 FE) MG TABS    Take 325 mg by mouth daily.   METOPROLOL SUCCINATE (TOPROL-XL) 50 MG 24 HR TABLET    TAKE ONE TABLET BY MOUTH EVERY EVENING   MIRTAZAPINE (REMERON) 15 MG TABLET    TAKE ONE TABLET BY MOUTH EVERY EVENING   ROSUVASTATIN (CRESTOR) 10 MG TABLET    Take 1 tablet (10 mg total) by mouth every morning.   SEVELAMER CARBONATE (RENVELA) 800 MG TABLET    Take 800 mg by mouth 3 (three) times daily with meals.  Modified Medications   No medications on file  Discontinued Medications   No medications on file    Allergies Allergies  Allergen Reactions   Penicillins Itching and Rash    Past Medical History Past Medical History:  Diagnosis Date   Allergic rhinitis    Anxiety    CHF (congestive heart failure) (HCC)    a. EF 30-35% by echo in 05/2020 b. EF at 45% in 09/2020   Chronic back pain    Chronic hepatitis C without hepatic coma (HCC)    COVID-19 virus infection 12/26/2019   Depression    Diabetes mellitus    Hepatitis C    Hypertension    Noncompliance    Poor appetite 07/17/2014   Substance abuse  (HCC)    HX of drug use and alcohol use    Past Surgical History Past Surgical History:  Procedure Laterality Date   APPENDECTOMY  2004   AV FISTULA PLACEMENT Left 11/26/2020   Procedure: LEFT ARM ARTERIOVENOUS (AV) FISTULA CREATION;  Surgeon: Larina Earthly, MD;  Location: [facility];  Service: Vascular;  Laterality: Left;   AV FISTULA PLACEMENT Left 06/03/2021   Procedure: INSERTION OF LEFT UPPER ARM ARTERIOVENOUS (AV) GORE-TEX GRAFT;  Surgeon: Larina Earthly, MD;  Location: [facility];  Service: Vascular;  Laterality: Left;   EXCHANGE OF A DIALYSIS CATHETER Right 02/24/2022   Procedure: EXCHANGE OF A DIALYSIS CATHETER;  Surgeon: Larina Earthly, MD;  Location: [facility];  Service: Vascular;  Laterality: Right;   IR FLUORO GUIDE CV LINE RIGHT  12/25/2020   IR US GUIDE VASC ACCESS RIGHT  12/25/2020   LIGATION ARTERIOVENOUS GORTEX GRAFT Left 08/05/2021   Procedure: LIGATION OF LEFT ARM ARTERIOVENOUS GORTEX GRAFT;  Surgeon: Larina Earthly, MD;  Location: [facility];  Service: Vascular;  Laterality: Left;    Family History family history includes Diabetes in her sister.  Social History Social History   Socioeconomic History   Marital status: Single    Spouse name: Not on file   Number of children: 3   Years of education: Not on file   Highest education level: Not on file  Occupational History   Occupation: Disabled  Tobacco Use   Smoking status: Light Smoker    Current packs/day: 0.25    Types: Cigarettes   Smokeless tobacco: Never  Vaping Use   Vaping status: Never Used  Substance and Sexual Activity   Alcohol use: Not Currently    Alcohol/week: 40.0 standard drinks of alcohol    Types: 40 Cans of beer per week    Comment: 2 16oz cans of beer a day   Drug use: Not Currently    Types: Marijuana, Cocaine    Comment: cant rememeber last time used.   Sexual activity:  Yes    Birth control/protection: Condom  Other Topics Concern   Not on file  Social History Narrative   Not on file   Social  Determinants of Health   Financial Resource Strain: Low Risk  (07/26/2020)   Overall Financial Resource Strain (CARDIA)    Difficulty of Paying Living Expenses: Not hard at all  Food Insecurity: No Food Insecurity (12/30/2022)   Hunger Vital Sign    Worried About Running Out of Food in the Last Year: Never true    Ran Out of Food in the Last Year: Never true  Transportation Needs: Unmet Transportation Needs (02/16/2023)   PRAPARE - Administrator, Civil Service (Medical): Yes    Lack of Transportation (Non-Medical): No  Physical Activity: Inactive (12/19/2020)   Exercise Vital Sign    Days of Exercise per Week: 0 days    Minutes of Exercise per Session: 0 min  Stress: Stress Concern Present (08/21/2021)   [employer] of Occupational Health - Occupational Stress Questionnaire    Feeling of Stress : Rather much  Social Connections: Moderately Integrated (12/19/2020)   Social Connection and Isolation Panel [NHANES]    Frequency of Communication with Friends and Family: More than three times a week    Frequency of Social Gatherings with Friends and Family: Once a week    Attends Religious Services: More than 4 times per year    Active Member of Clubs or Organizations: Yes    Attends Banker Meetings: More than 4 times per year    Marital Status: Separated  Intimate Partner Violence: Not At Risk (09/30/2022)   Humiliation, Afraid, Rape, and Kick questionnaire    Fear of Current or Ex-Partner: No    Emotionally Abused: No    Physically Abused: No    Sexually Abused: No    Lab Results  Component Value Date   HGBA1C 9.5 (H) 02/11/2023   HGBA1C 9.4 (A) 01/07/2023   HGBA1C 8.3 (H) 08/18/2022   Lab Results  Component Value Date   CHOL 124 02/11/2023   Lab Results  Component Value Date   HDL 33 (L) 02/11/2023   Lab Results  Component Value Date   LDLCALC 63 02/11/2023   Lab Results  Component Value Date   TRIG 223 (H) 02/11/2023   Lab Results   Component Value Date   CHOLHDL 3.8 02/11/2023   Lab Results  Component Value Date   CREATININE 5.67 (H) 02/11/2023   Lab Results  Component Value Date   GFR 7.11 (LL) 01/07/2023   Lab Results  Component Value Date   MICROALBUR 69.5 (H) 01/07/2023      Component Value Date/Time   NA 138 02/11/2023 0805   NA 135 (A) 04/30/2022 0000   K 4.1 02/11/2023 0805   CL 96 (L) 02/11/2023 0805   CO2 28 02/11/2023 0805   GLUCOSE 115 (H) 02/11/2023 0805   BUN 16 02/11/2023 0805   BUN 50 (A) 05/08/2022 0000   CREATININE 5.67 (H) 02/11/2023 0805   CALCIUM 8.9 02/11/2023 0805   PROT 8.0 02/11/2023 0805   PROT 6.2 05/02/2020 1535   ALBUMIN 4.1 01/07/2023 1439   ALBUMIN 3.3 (L) 05/02/2020 1535   AST 15 02/11/2023 0805   ALT 15 02/11/2023 0805   ALT 65 (H) 12/04/2013 1449   ALKPHOS 88 01/07/2023 1439   BILITOT 0.5 02/11/2023 0805   BILITOT <0.2 05/02/2020 1535   BILITOT 0.3 12/04/2013 1449   GFRNONAA 8 (L) 02/24/2022 1914  GFRNONAA 39 (L) 03/28/2019 1056   GFRAA 31 (L) 05/02/2020 1535   GFRAA 45 (L) 03/28/2019 1056      Latest Ref Rng & Units 02/11/2023    8:05 AM 01/07/2023    2:39 PM 05/08/2022   12:00 AM  BMP  Glucose 65 - 99 mg/dL 401  027    BUN 7 - 25 mg/dL 16  35  50      Creatinine 0.50 - 1.03 mg/dL 2.53  6.64    BUN/Creat Ratio 6 - 22 (calc) 3     Sodium 135 - 146 mmol/L 138  139    Potassium 3.5 - 5.3 mmol/L 4.1  4.5    Chloride 98 - 110 mmol/L 96  100    CO2 20 - 32 mmol/L 28  26    Calcium 8.6 - 10.4 mg/dL 8.9  9.3       This result is from an external source.       Component Value Date/Time   WBC 9.4 02/11/2023 0805   RBC 4.54 02/11/2023 0805   HGB 13.9 02/11/2023 0805   HGB 10.5 (L) 01/05/2020 1132   HCT 42.2 02/11/2023 0805   HCT 31.9 (L) 01/05/2020 1132   PLT 218 02/11/2023 0805   PLT 191 01/05/2020 1132   MCV 93.0 02/11/2023 0805   MCV 90 01/05/2020 1132   MCH 30.6 02/11/2023 0805   MCHC 32.9 02/11/2023 0805   RDW 13.8 02/11/2023 0805   RDW  12.7 01/05/2020 1132   LYMPHSABS 2.5 01/20/2022 1350   MONOABS 0.6 01/20/2022 1350   EOSABS 235 02/11/2023 0805   BASOSABS 85 02/11/2023 0805     Parts of this note may have been dictated using voice recognition software. There may be variances in spelling and vocabulary which are unintentional. Not all errors are proofread. Please notify the Thereasa Parkin if any discrepancies are noted or if the meaning of any statement is not clear.

## 2023-02-25 NOTE — Patient Instructions (Signed)

## 2023-03-01 ENCOUNTER — Other Ambulatory Visit: Payer: Self-pay

## 2023-03-01 ENCOUNTER — Emergency Department (HOSPITAL_COMMUNITY): Payer: Medicaid Other

## 2023-03-01 ENCOUNTER — Encounter (HOSPITAL_COMMUNITY): Payer: Self-pay | Admitting: Emergency Medicine

## 2023-03-01 ENCOUNTER — Inpatient Hospital Stay (HOSPITAL_COMMUNITY)
Admission: EM | Admit: 2023-03-01 | Discharge: 2023-03-04 | DRG: 064 | Disposition: A | Discharge status: Home / Self Care | Payer: Medicaid Other | Attending: Internal Medicine | Admitting: Internal Medicine

## 2023-03-01 DIAGNOSIS — I132 Hypertensive heart and chronic kidney disease with heart failure and with stage 5 chronic kidney disease, or end stage renal disease: Secondary | ICD-10-CM | POA: Diagnosis present

## 2023-03-01 DIAGNOSIS — F419 Anxiety disorder, unspecified: Secondary | ICD-10-CM | POA: Diagnosis present

## 2023-03-01 DIAGNOSIS — F32A Depression, unspecified: Secondary | ICD-10-CM | POA: Diagnosis present

## 2023-03-01 DIAGNOSIS — Z833 Family history of diabetes mellitus: Secondary | ICD-10-CM

## 2023-03-01 DIAGNOSIS — Z79899 Other long term (current) drug therapy: Secondary | ICD-10-CM

## 2023-03-01 DIAGNOSIS — Z8616 Personal history of COVID-19: Secondary | ICD-10-CM

## 2023-03-01 DIAGNOSIS — Z7982 Long term (current) use of aspirin: Secondary | ICD-10-CM

## 2023-03-01 DIAGNOSIS — Z831 Family history of other infectious and parasitic diseases: Secondary | ICD-10-CM

## 2023-03-01 DIAGNOSIS — H532 Diplopia: Secondary | ICD-10-CM | POA: Diagnosis present

## 2023-03-01 DIAGNOSIS — D631 Anemia in chronic kidney disease: Secondary | ICD-10-CM | POA: Diagnosis present

## 2023-03-01 DIAGNOSIS — Z794 Long term (current) use of insulin: Secondary | ICD-10-CM

## 2023-03-01 DIAGNOSIS — R29702 NIHSS score 2: Secondary | ICD-10-CM | POA: Diagnosis present

## 2023-03-01 DIAGNOSIS — E1165 Type 2 diabetes mellitus with hyperglycemia: Secondary | ICD-10-CM | POA: Diagnosis present

## 2023-03-01 DIAGNOSIS — E1122 Type 2 diabetes mellitus with diabetic chronic kidney disease: Secondary | ICD-10-CM | POA: Diagnosis present

## 2023-03-01 DIAGNOSIS — Z7902 Long term (current) use of antithrombotics/antiplatelets: Secondary | ICD-10-CM

## 2023-03-01 DIAGNOSIS — N186 End stage renal disease: Secondary | ICD-10-CM | POA: Diagnosis present

## 2023-03-01 DIAGNOSIS — I6329 Cerebral infarction due to unspecified occlusion or stenosis of other precerebral arteries: Principal | ICD-10-CM | POA: Diagnosis present

## 2023-03-01 DIAGNOSIS — Z880 Allergy status to penicillin: Secondary | ICD-10-CM

## 2023-03-01 DIAGNOSIS — I639 Cerebral infarction, unspecified: Secondary | ICD-10-CM | POA: Diagnosis not present

## 2023-03-01 DIAGNOSIS — E785 Hyperlipidemia, unspecified: Secondary | ICD-10-CM | POA: Diagnosis present

## 2023-03-01 DIAGNOSIS — R9431 Abnormal electrocardiogram [ECG] [EKG]: Secondary | ICD-10-CM | POA: Diagnosis not present

## 2023-03-01 DIAGNOSIS — G4489 Other headache syndrome: Secondary | ICD-10-CM | POA: Diagnosis not present

## 2023-03-01 DIAGNOSIS — F17210 Nicotine dependence, cigarettes, uncomplicated: Secondary | ICD-10-CM | POA: Diagnosis present

## 2023-03-01 DIAGNOSIS — I5042 Chronic combined systolic (congestive) and diastolic (congestive) heart failure: Secondary | ICD-10-CM | POA: Diagnosis present

## 2023-03-01 DIAGNOSIS — H5710 Ocular pain, unspecified eye: Secondary | ICD-10-CM | POA: Diagnosis not present

## 2023-03-01 DIAGNOSIS — M898X9 Other specified disorders of bone, unspecified site: Secondary | ICD-10-CM | POA: Diagnosis present

## 2023-03-01 DIAGNOSIS — Z992 Dependence on renal dialysis: Secondary | ICD-10-CM

## 2023-03-01 LAB — COMPREHENSIVE METABOLIC PANEL
ALT: 20 U/L (ref 0–44)
AST: 18 U/L (ref 15–41)
Albumin: 3.4 g/dL — ABNORMAL LOW (ref 3.5–5.0)
Alkaline Phosphatase: 84 U/L (ref 38–126)
Anion gap: 13 (ref 5–15)
BUN: 19 mg/dL (ref 6–20)
CO2: 24 mmol/L (ref 22–32)
Calcium: 8.4 mg/dL — ABNORMAL LOW (ref 8.9–10.3)
Chloride: 95 mmol/L — ABNORMAL LOW (ref 98–111)
Creatinine, Ser: 4.1 mg/dL — ABNORMAL HIGH (ref 0.44–1.00)
GFR, Estimated: 12 mL/min — ABNORMAL LOW (ref >60)
Glucose, Bld: 336 mg/dL — ABNORMAL HIGH (ref 70–99)
Potassium: 4.1 mmol/L (ref 3.5–5.1)
Sodium: 132 mmol/L — ABNORMAL LOW (ref 135–145)
Total Bilirubin: 0.4 mg/dL (ref <1.2)
Total Protein: 7.4 g/dL (ref 6.5–8.1)

## 2023-03-01 LAB — CBC WITH DIFFERENTIAL/PLATELET
Abs Immature Granulocytes: 0.05 10*3/uL (ref 0.00–0.07)
Basophils Absolute: 0.1 10*3/uL (ref 0.0–0.1)
Basophils Relative: 1 %
Eosinophils Absolute: 0.3 10*3/uL (ref 0.0–0.5)
Eosinophils Relative: 3 %
HCT: 33.2 % — ABNORMAL LOW (ref 36.0–46.0)
Hemoglobin: 11.3 g/dL — ABNORMAL LOW (ref 12.0–15.0)
Immature Granulocytes: 1 %
Lymphocytes Relative: 35 %
Lymphs Abs: 2.8 10*3/uL (ref 0.7–4.0)
MCH: 31.4 pg (ref 26.0–34.0)
MCHC: 34 g/dL (ref 30.0–36.0)
MCV: 92.2 fL (ref 80.0–100.0)
Monocytes Absolute: 0.4 10*3/uL (ref 0.1–1.0)
Monocytes Relative: 4 %
Neutro Abs: 4.6 10*3/uL (ref 1.7–7.7)
Neutrophils Relative %: 56 %
Platelets: 155 10*3/uL (ref 150–400)
RBC: 3.6 MIL/uL — ABNORMAL LOW (ref 3.87–5.11)
RDW: 13.4 % (ref 11.5–15.5)
WBC: 8.2 10*3/uL (ref 4.0–10.5)
nRBC: 0 % (ref 0.0–0.2)

## 2023-03-01 LAB — TROPONIN I (HIGH SENSITIVITY)
Troponin I (High Sensitivity): 11 ng/L (ref <18)
Troponin I (High Sensitivity): 9 ng/L (ref <18)

## 2023-03-01 NOTE — ED Triage Notes (Signed)
Pt coming from dialysis via [facility] with reports of "wandering left eye x 24-48hrs." Pt received full dialysis today.

## 2023-03-01 NOTE — ED Provider Triage Note (Signed)
Emergency Medicine Provider Triage Evaluation Note  Latoya Walsh , a 62 y.o. female  was evaluated in triage.  Pt complains of visual problems that started yesterday and have continued.  She states her "vision goes sideways" since last night.  Reports hx but had gotten better.  Also c/o of headache. No n/v.  No abd pain.  Sugar is 350 right now on her monitor.  Review of Systems  Positive: Intermittent chest pain, feet itching Negative: SOB, n/v  Physical Exam  LMP 06/23/2010  Gen:   Awake, no distress   Resp:  Normal effort  MSK:   Moves extremities without difficulty  Other:  Left eye with mild lid droop, pupils are reactive bilateral.  EOM appear intact.  5/5 strength in upper and lower ext bilaterally.  Pt appears slightly anxious.  Pulses present in feet  Medical Decision Making  Medically screening exam initiated at 4:26 PM.  Appropriate orders placed.  SAKURA OTEY was informed that the remainder of the evaluation will be completed by another provider, this initial triage assessment does not replace that evaluation, and the importance of remaining in the ED until their evaluation is complete.     Gwyneth Sprout, MD 03/01/23 [phone]

## 2023-03-02 ENCOUNTER — Emergency Department (HOSPITAL_COMMUNITY): Payer: Medicaid Other

## 2023-03-02 DIAGNOSIS — H532 Diplopia: Secondary | ICD-10-CM

## 2023-03-02 DIAGNOSIS — I639 Cerebral infarction, unspecified: Secondary | ICD-10-CM | POA: Diagnosis not present

## 2023-03-02 LAB — CBG MONITORING, ED: Glucose-Capillary: 141 mg/dL — ABNORMAL HIGH (ref 70–99)

## 2023-03-02 LAB — GLUCOSE, CAPILLARY: Glucose-Capillary: 210 mg/dL — ABNORMAL HIGH (ref 70–99)

## 2023-03-02 MED ORDER — ROSUVASTATIN CALCIUM 20 MG PO TABS
20.0000 mg | ORAL_TABLET | Freq: Every morning | ORAL | Status: DC
  Administered 2023-03-03: 20 mg via ORAL
  Filled 2023-03-02: qty 1

## 2023-03-02 MED ORDER — BUSPIRONE HCL 10 MG PO TABS
5.0000 mg | ORAL_TABLET | Freq: Two times a day (BID) | ORAL | Status: DC
  Administered 2023-03-02 – 2023-03-04 (×4): 5 mg via ORAL
  Filled 2023-03-02 (×4): qty 1

## 2023-03-02 MED ORDER — STROKE: EARLY STAGES OF RECOVERY BOOK
Freq: Once | Status: AC
  Filled 2023-03-02: qty 1

## 2023-03-02 MED ORDER — ACETAMINOPHEN 160 MG/5ML PO SOLN: 650.0000 mg | ORAL | Status: DC | PRN

## 2023-03-02 MED ORDER — INSULIN ASPART 100 UNIT/ML IJ SOLN
0.0000 [IU] | INTRAMUSCULAR | Status: DC
  Administered 2023-03-02 – 2023-03-03 (×2): 3 [IU] via SUBCUTANEOUS
  Administered 2023-03-03: 5 [IU] via SUBCUTANEOUS

## 2023-03-02 MED ORDER — SENNOSIDES-DOCUSATE SODIUM 8.6-50 MG PO TABS: 1.0000 | ORAL_TABLET | Freq: Every evening | ORAL | Status: DC | PRN

## 2023-03-02 MED ORDER — IOHEXOL 350 MG/ML SOLN
75.0000 mL | Freq: Once | INTRAVENOUS | Status: AC | PRN
  Administered 2023-03-02: 75 mL via INTRAVENOUS

## 2023-03-02 MED ORDER — ACETAMINOPHEN 325 MG PO TABS
650.0000 mg | ORAL_TABLET | ORAL | Status: DC | PRN
  Administered 2023-03-04: 650 mg via ORAL
  Filled 2023-03-02: qty 2

## 2023-03-02 MED ORDER — GADOBUTROL 1 MMOL/ML IV SOLN
7.0000 mL | Freq: Once | INTRAVENOUS | Status: AC | PRN
  Administered 2023-03-02: 7 mL via INTRAVENOUS

## 2023-03-02 MED ORDER — ONDANSETRON HCL 4 MG/2ML IJ SOLN: 4.0000 mg | Freq: Four times a day (QID) | INTRAMUSCULAR | Status: DC | PRN

## 2023-03-02 MED ORDER — TETRACAINE HCL 0.5 % OP SOLN
1.0000 [drp] | Freq: Once | OPHTHALMIC | Status: AC
  Administered 2023-03-02: 1 [drp] via OPHTHALMIC
  Filled 2023-03-02: qty 4

## 2023-03-02 MED ORDER — CLOPIDOGREL BISULFATE 300 MG PO TABS
300.0000 mg | ORAL_TABLET | Freq: Once | ORAL | Status: AC
  Administered 2023-03-02: 300 mg via ORAL
  Filled 2023-03-02: qty 1

## 2023-03-02 MED ORDER — ASPIRIN 81 MG PO TBEC
81.0000 mg | DELAYED_RELEASE_TABLET | Freq: Every day | ORAL | Status: DC
  Administered 2023-03-02 – 2023-03-04 (×3): 81 mg via ORAL
  Filled 2023-03-02 (×3): qty 1

## 2023-03-02 MED ORDER — ALBUTEROL SULFATE (2.5 MG/3ML) 0.083% IN NEBU: 3.0000 mL | INHALATION_SOLUTION | RESPIRATORY_TRACT | Status: DC | PRN

## 2023-03-02 MED ORDER — ACETAMINOPHEN 650 MG RE SUPP: 650.0000 mg | RECTAL | Status: DC | PRN

## 2023-03-02 MED ORDER — CLOPIDOGREL BISULFATE 75 MG PO TABS
75.0000 mg | ORAL_TABLET | Freq: Every day | ORAL | Status: DC
  Administered 2023-03-03 – 2023-03-04 (×2): 75 mg via ORAL
  Filled 2023-03-02 (×2): qty 1

## 2023-03-02 MED ORDER — FLUORESCEIN SODIUM 1 MG OP STRP
2.0000 | ORAL_STRIP | Freq: Once | OPHTHALMIC | Status: AC
  Administered 2023-03-02: 2 via OPHTHALMIC
  Filled 2023-03-02: qty 2

## 2023-03-02 MED ORDER — MIRTAZAPINE 15 MG PO TABS
15.0000 mg | ORAL_TABLET | Freq: Every evening | ORAL | Status: DC
  Administered 2023-03-02 – 2023-03-03 (×2): 15 mg via ORAL
  Filled 2023-03-02 (×2): qty 1

## 2023-03-02 MED ORDER — ACETAMINOPHEN 325 MG PO TABS: 650.0000 mg | ORAL_TABLET | Freq: Four times a day (QID) | ORAL | Status: DC | PRN

## 2023-03-02 MED ORDER — HEPARIN SODIUM (PORCINE) 5000 UNIT/ML IJ SOLN
5000.0000 [IU] | Freq: Three times a day (TID) | INTRAMUSCULAR | Status: DC
Start: 2023-03-02 — End: 2023-03-04
  Administered 2023-03-03 – 2023-03-04 (×4): 5000 [IU] via SUBCUTANEOUS
  Filled 2023-03-02 (×3): qty 1

## 2023-03-02 NOTE — ED Notes (Signed)
Taken to MRI by transporter.

## 2023-03-02 NOTE — ED Notes (Signed)
Brother Artis Flock Flippin [phone] would like an update asap

## 2023-03-02 NOTE — H&P (Signed)
History and Physical    Patient: Latoya Walsh DOB: [birth year] DOA: 03/01/2023 DOS: the patient was seen and examined on 03/02/2023 PCP: [name], Donalee Citrin, NP  Patient coming from: Home  Chief Complaint:  Chief Complaint  Patient presents with   Eye Problem   HPI: Latoya Walsh is a 62 y.o. female with medical history significant CHF, ESRD, diabetes melitis, HLD, HTN, history of hep C, history of drug/alcohol use, tobacco abuse presents with double vision x 2 days.  Patient reported double vision which started around 5 PM on Sunday 12/8, which prompted patient to come to the ED.  Of note, patient had dialysis on 12/9, with persistent symptoms.  Today, patient denies any further double vision or any other focal neurologic deficits, denies any weakness or sensory deficits, chest pain, abdominal pain, nausea/vomiting, fever/chills.  No history of CVAs in the past.  Patient denies any current illicit drug use, alcohol use.  But reports smoking about 1-4 cigarettes per day.  Patient has been compliant with medications and had dialysis.  In the ED, patient noted to have vital signs fairly stable.  Labs fairly stable except hemoglobin of 11.3, sodium of 132.  Troponins negative.  CTA head negative, MRI brain showed focal acute infarction in the dorsal pons just to the right of midline, demyelinating disease is not excluded but much less likely.  CTA head and neck showed no emergent large vessel occlusion.  UA negative, chest x-ray negative.  Neurology consulted.  [facility] hospitalist admitted for further management.     Review of Systems: As mentioned in the history of present illness. All other systems reviewed and are negative.     Past Medical History:  Diagnosis Date   Allergic rhinitis    Anxiety    CHF (congestive heart failure) (HCC)    a. EF 30-35% by echo in 05/2020 b. EF at 45% in 09/2020   Chronic back pain    Chronic hepatitis C without hepatic coma (HCC)     COVID-19 virus infection 12/26/2019   Depression    Diabetes mellitus    Hepatitis C    Hypertension    Noncompliance    Poor appetite 07/17/2014   Substance abuse (HCC)    HX of drug use and alcohol use   Past Surgical History:  Procedure Laterality Date   APPENDECTOMY  2004   AV FISTULA PLACEMENT Left 11/26/2020   Procedure: LEFT ARM ARTERIOVENOUS (AV) FISTULA CREATION;  Surgeon: Larina Earthly, MD;  Location: [facility];  Service: Vascular;  Laterality: Left;   AV FISTULA PLACEMENT Left 06/03/2021   Procedure: INSERTION OF LEFT UPPER ARM ARTERIOVENOUS (AV) GORE-TEX GRAFT;  Surgeon: Larina Earthly, MD;  Location: [facility];  Service: Vascular;  Laterality: Left;   EXCHANGE OF A DIALYSIS CATHETER Right 02/24/2022   Procedure: EXCHANGE OF A DIALYSIS CATHETER;  Surgeon: Larina Earthly, MD;  Location: [facility];  Service: Vascular;  Laterality: Right;   IR FLUORO GUIDE CV LINE RIGHT  12/25/2020   IR US GUIDE VASC ACCESS RIGHT  12/25/2020   LIGATION ARTERIOVENOUS GORTEX GRAFT Left 08/05/2021   Procedure: LIGATION OF LEFT ARM ARTERIOVENOUS GORTEX GRAFT;  Surgeon: Larina Earthly, MD;  Location: [facility];  Service: Vascular;  Laterality: Left;   Social History:  reports that she has been smoking cigarettes. She has never used smokeless tobacco. She reports that she does not currently use alcohol after a past usage of about 40.0 standard drinks of alcohol per week.  She reports that she does not currently use drugs after having used the following drugs: Marijuana and Cocaine.  Allergies  Allergen Reactions   Penicillins Itching and Rash    Family History  Problem Relation Age of Onset   Diabetes Sister        Twin - AIDS     Prior to Admission medications   Medication Sig Start Date End Date Taking? Authorizing Provider  albuterol (VENTOLIN HFA) 108 (90 Base) MCG/ACT inhaler Inhale 2 puffs into the lungs every 4 (four) hours as needed for wheezing or shortness of breath. 11/05/21  Yes Shah, Pratik D, DO   busPIRone (BUSPAR) 5 MG tablet Take 1 tablet (5 mg total) by mouth 2 (two) times daily. 12/31/22  Yes [name], [name], NP  D3-1000 25 MCG (1000 UT) capsule Take 1,000 Units by mouth daily. 05/12/22  Yes [provider]  insulin glargine (LANTUS SOLOSTAR) 100 UNIT/ML Solostar Pen Inject 38 Units into the skin at bedtime. Patient taking differently: Inject 46 Units into the skin daily. 12/31/22  Yes [name], [name], NP  insulin lispro (HUMALOG KWIKPEN) 100 UNIT/ML KwikPen Inject 6 Units into the skin 2 (two) times daily. Patient taking differently: Inject 45 Units into the skin daily. 02/25/23 05/26/23 Yes Motwani, Komal, MD  losartan (COZAAR) 50 MG tablet Take 50 mg by mouth.   Yes [provider]  metoprolol succinate (TOPROL-XL) 50 MG 24 hr tablet TAKE ONE TABLET BY MOUTH EVERY EVENING 08/10/22  Yes Branch, [name], MD  mirtazapine (REMERON) 15 MG tablet TAKE ONE TABLET BY MOUTH EVERY EVENING 11/17/22  Yes [name], [name], NP  rosuvastatin (CRESTOR) 10 MG tablet Take 1 tablet (10 mg total) by mouth every morning. Patient taking differently: Take 10 mg by mouth daily. 05/19/22  Yes [name], [name], NP  Sevelamer Carbonate (RENVELA PO) Take 1 packet by mouth See admin instructions. 1 packet with each meal and with snacks   Yes [provider]  amLODipine (NORVASC) 10 MG tablet Take 10 mg by mouth daily. Patient not taking: Reported on 03/02/2023 02/10/21   [provider]  Blood Glucose Monitoring Suppl (ONETOUCH VERIO FLEX SYSTEM) w/Device KIT USE TO TEST BLOOD SUGAR TWICE DAILY AS DIRECTED *REFILL REQUEST* 11/20/22   [name], [name], NP  COMFORT EZ PEN NEEDLES 32G X 4 MM MISC USE AS DIRECTED with insulin injections 12/29/21   Reardon, Freddi Starr, NP  Continuous Glucose Sensor (FREESTYLE LIBRE 3 SENSOR) MISC 1 Device by Does not apply route continuous. Place 1 sensor on the skin every 14 days. Use to check glucose continuously 01/07/23   Altamese Pinnacle, MD   docusate sodium (COLACE) 100 MG capsule Take 1 capsule (100 mg total) by mouth daily as needed for mild constipation. Patient not taking: Reported on 03/02/2023 02/23/23   [name], [name], NP  Insulin Pen Needle (PEN NEEDLES) 31G X 5 MM MISC Use to inject insulin once daily 11/04/20   [name], NP  Iron, Ferrous Sulfate, 325 (65 Fe) MG TABS Take 325 mg by mouth daily. Patient not taking: Reported on 03/02/2023 02/23/23   [name], [name], NP  sevelamer carbonate (RENVELA) 800 MG tablet Take 800 mg by mouth 3 (three) times daily with meals. Patient not taking: Reported on 03/02/2023    [provider]  sucroferric oxyhydroxide (VELPHORO) 500 MG chewable tablet Chew 500 mg by mouth 3 (three) times daily with meals. Patient not taking: Reported on 03/02/2023    [provider]  Physical Exam: Vitals:   03/02/23 1830 03/02/23 1832 03/02/23 1845 03/02/23 1934  BP: 99/81 (!) 100/49 103/70 (!) 92/57  Pulse:  81 83 84  Resp:  16  15  Temp:  97.6 F (36.4 C)  98.1 F (36.7 C)  TempSrc:    Oral  SpO2:  100% 100% 100%  Weight:    73.9 kg  Height:    5\' 7"  (1.702 m)   General: NAD  Cardiovascular: S1, S2 present Respiratory: CTAB Abdomen: Soft, nontender, nondistended, bowel sounds present Musculoskeletal: No bilateral pedal edema noted Skin: Normal Psychiatry: Normal mood  Neurology: No obvious focal neurologic deficits, strength equal in all extremities, sensory intact.  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 Double vision resolved, no obvious focal neurologic deficits currently CTA head negative MRI brain showed focal acute infarction in the dorsal pons just to the right of midline, demyelinating disease is not excluded but much less likely CTA head and neck showed no emergent large vessel occlusion Neurology consulted, recommend stroke workup Lipid panel, A1c 9.5 Echo  pending Allow for permissive hypertension Aspirin, Plavix, increased home Crestor PT/OT/SLP Telemetry  Hypertension Allow for permissive hypertension Held home losartan, Toprol  ESRD on HD M/W/F Neurology consulted for HD on 12/11  Chronic systolic and diastolic HF Appears euvolemic Last echo done on 07/29/2021 showed EF of 55 to 60%, no regional wall motion abnormality, grade 1 diastolic dysfunction Repeat echo pending  Diabetes mellitus type 2, uncontrolled A1c 9.5 SSI, Accu-check, hypoglycemic protocol  Hyperlipidemia Lipid panel pending Continue Crestor  History of polysubstance abuse Denies current use UDS pending (reports urinating once a day)  Tobacco abuse Advised to quit     Advance Care Planning: Full  Consults: Neurology, nephrology  Family Communication: None at bedside  Severity of Illness: The appropriate patient status for this patient is OBSERVATION. Observation status is judged to be reasonable and necessary in order to provide the required intensity of service to ensure the patient's safety. The patient's presenting symptoms, physical exam findings, and initial radiographic and laboratory data in the context of their medical condition is felt to place them at decreased risk for further clinical deterioration. Furthermore, it is anticipated that the patient will be medically stable for discharge from the hospital within 2 midnights of admission.   Author: Briant Cedar, MD 03/02/2023 8:11 PM  For on call review [email or web address].

## 2023-03-02 NOTE — ED Notes (Signed)
Neuro MD into see, at [facility].

## 2023-03-02 NOTE — ED Provider Notes (Cosign Needed Addendum)
[place] EMERGENCY DEPARTMENT AT [facility] Provider Note   CSN: [id number] Arrival date & time: 03/01/23  1609     History  Chief Complaint  Patient presents with   Eye Problem    ANNALIZ SAVARY is a 62 y.o. female, history of ESRD, on dialysis, diabetes, who presents to the ED secondary to double vision, it has been going on since 4 PM yesterday.  She states she noticed that she was having double vision yesterday, and it has intermittently come and gone.  She states she has had some slight headaches, the last few weeks, but denies any current headache.  Denies any head trauma, no use of blood thinners.  States it is very hard to describe.  Denies any weight loss, dizziness, or weakness on one side of the body.  Reports generalized weakness.  Home Medications Prior to Admission medications   Medication Sig Start Date End Date Taking? Authorizing Provider  albuterol (VENTOLIN HFA) 108 (90 Base) MCG/ACT inhaler Inhale 2 puffs into the lungs every 4 (four) hours as needed for wheezing or shortness of breath. 11/05/21  Yes Shah, Pratik D, DO  Blood Glucose Monitoring Suppl (ONETOUCH VERIO FLEX SYSTEM) w/Device KIT USE TO TEST BLOOD SUGAR TWICE DAILY AS DIRECTED *REFILL REQUEST* 11/20/22  Yes [name], [name], NP  busPIRone (BUSPAR) 5 MG tablet Take 1 tablet (5 mg total) by mouth 2 (two) times daily. 12/31/22  Yes [name], [name], NP  COMFORT EZ PEN NEEDLES 32G X 4 MM MISC USE AS DIRECTED with insulin injections 12/29/21  Yes Reardon, Freddi Starr, NP  Continuous Glucose Sensor (FREESTYLE LIBRE 3 SENSOR) MISC 1 Device by Does not apply route continuous. Place 1 sensor on the skin every 14 days. Use to check glucose continuously 01/07/23  Yes Motwani, Komal, MD  D3-1000 25 MCG (1000 UT) capsule Take 1,000 Units by mouth daily. 05/12/22  Yes [provider]  insulin glargine (LANTUS SOLOSTAR) 100 UNIT/ML Solostar Pen Inject 38 Units into the skin at bedtime. Patient taking  differently: Inject 46 Units into the skin daily. 12/31/22  Yes [name], [name], NP  insulin lispro (HUMALOG KWIKPEN) 100 UNIT/ML KwikPen Inject 6 Units into the skin 2 (two) times daily. Patient taking differently: Inject 45 Units into the skin daily. 02/25/23 05/26/23 Yes Motwani, Komal, MD  Insulin Pen Needle (PEN NEEDLES) 31G X 5 MM MISC Use to inject insulin once daily 11/04/20  Yes Reardon, Alphonzo Lemmings J, NP  metoprolol succinate (TOPROL-XL) 50 MG 24 hr tablet TAKE ONE TABLET BY MOUTH EVERY EVENING 08/10/22  Yes Branch, [name], MD  mirtazapine (REMERON) 15 MG tablet TAKE ONE TABLET BY MOUTH EVERY EVENING 11/17/22  Yes [name], [name], NP  Sevelamer Carbonate (RENVELA PO) Take 1 packet by mouth See admin instructions. 1 packet with each meal and with snacks   Yes [provider]  aspirin EC 81 MG tablet Take 1 tablet (81 mg total) by mouth daily. Swallow whole. 03/05/23   Lanae Boast, MD  atorvastatin (LIPITOR) 40 MG tablet Take 1 tablet (40 mg total) by mouth daily. 03/05/23 04/04/23  Lanae Boast, MD  clopidogrel (PLAVIX) 75 MG tablet Take 1 tablet (75 mg total) by mouth daily for 21 days. 03/05/23 03/26/23  Lanae Boast, MD  docusate sodium (COLACE) 100 MG capsule Take 1 capsule (100 mg total) by mouth daily as needed for mild constipation. Patient not taking: Reported on 03/02/2023 02/23/23   [name], [name], NP  Iron, Ferrous Sulfate, 325 (  65 Fe) MG TABS Take 325 mg by mouth daily. Patient not taking: Reported on 03/02/2023 02/23/23   [name], [name], NP  rosuvastatin (CRESTOR) 10 MG tablet TAKE 1 TABLET BY MOUTH EVERY MORNING 03/10/23   [name], [name], NP  sevelamer carbonate (RENVELA) 800 MG tablet Take 800 mg by mouth 3 (three) times daily with meals. Patient not taking: Reported on 03/02/2023    [provider]  sucroferric oxyhydroxide (VELPHORO) 500 MG chewable tablet Chew 500 mg by mouth 3 (three) times daily with meals. Patient not taking: Reported on 03/02/2023     [provider]      Allergies    Penicillins    Review of Systems   Review of Systems  Eyes:  Positive for visual disturbance.  Neurological:  Positive for headaches. Negative for dizziness.    Physical Exam Updated Vital Signs BP 98/65 (BP Location: Right Leg)   Pulse 88   Temp 98.2 F (36.8 C) (Oral)   Resp 17   Ht 5\' 7"  (1.702 m)   Wt 72.7 kg Comment: bed  LMP 06/23/2010   SpO2 100%   BMI 25.10 kg/m  Physical Exam Vitals and nursing note reviewed.  Constitutional:      General: She is not in acute distress.    Appearance: She is well-developed.  HENT:     Head: Normocephalic and atraumatic.     Right Ear: Tympanic membrane normal.     Left Ear: Tympanic membrane normal.     Nose: Nose normal.     Mouth/Throat:     Mouth: Mucous membranes are moist.  Eyes:     General: Lids are normal. Lids are everted, no foreign bodies appreciated.        Right eye: No foreign body, discharge or hordeolum.        Left eye: No foreign body, discharge or hordeolum.     Extraocular Movements: Extraocular movements intact.     Conjunctiva/sclera: Conjunctivae normal.     Right eye: Right conjunctiva is not injected.     Left eye: Left conjunctiva is not injected.     Comments: No evidence of any kind of fluorescein uptake.  Unable to tolerate Tono-Pen exam  Cardiovascular:     Rate and Rhythm: Normal rate and regular rhythm.     Heart sounds: No murmur heard. Pulmonary:     Effort: Pulmonary effort is normal. No respiratory distress.     Breath sounds: Normal breath sounds.  Abdominal:     Palpations: Abdomen is soft.     Tenderness: There is no abdominal tenderness.  Musculoskeletal:        General: No swelling.     Cervical back: Neck supple.  Skin:    General: Skin is warm and dry.     Capillary Refill: Capillary refill takes less than 2 seconds.  Neurological:     Mental Status: She is alert and oriented to person, place, and time.     Comments: Slight  ptosis of L eye, no facial droop noted, no speech changes, 5/5 strength of BUE and BLE  Psychiatric:        Mood and Affect: Mood normal.     ED Results / Procedures / Treatments   Labs (all labs ordered are listed, but only abnormal results are displayed) Labs Reviewed  CBC WITH DIFFERENTIAL/PLATELET - Abnormal; Notable for the following components:      Result Value   RBC 3.60 (*)    Hemoglobin 11.3 (*)  HCT 33.2 (*)    All other components within normal limits  COMPREHENSIVE METABOLIC PANEL - Abnormal; Notable for the following components:   Sodium 132 (*)    Chloride 95 (*)    Glucose, Bld 336 (*)    Creatinine, Ser 4.10 (*)    Calcium 8.4 (*)    Albumin 3.4 (*)    GFR, Estimated 12 (*)    All other components within normal limits  CBC WITH DIFFERENTIAL/PLATELET - Abnormal; Notable for the following components:   RBC 3.50 (*)    Hemoglobin 10.8 (*)    HCT 32.2 (*)    All other components within normal limits  RENAL FUNCTION PANEL - Abnormal; Notable for the following components:   BUN 35 (*)    Creatinine, Ser 7.91 (*)    Phosphorus 5.8 (*)    Albumin 3.2 (*)    GFR, Estimated 5 (*)    All other components within normal limits  LIPID PANEL - Abnormal; Notable for the following components:   Triglycerides 163 (*)    HDL 22 (*)    All other components within normal limits  GLUCOSE, CAPILLARY - Abnormal; Notable for the following components:   Glucose-Capillary 210 (*)    All other components within normal limits  GLUCOSE, CAPILLARY - Abnormal; Notable for the following components:   Glucose-Capillary 262 (*)    All other components within normal limits  HEMOGLOBIN A1C - Abnormal; Notable for the following components:   Hgb A1c MFr Bld 9.3 (*)    All other components within normal limits  GLUCOSE, CAPILLARY - Abnormal; Notable for the following components:   Glucose-Capillary 111 (*)    All other components within normal limits  GLUCOSE, CAPILLARY - Abnormal;  Notable for the following components:   Glucose-Capillary 241 (*)    All other components within normal limits  PHOSPHORUS - Abnormal; Notable for the following components:   Phosphorus 5.8 (*)    All other components within normal limits  GLUCOSE, CAPILLARY - Abnormal; Notable for the following components:   Glucose-Capillary 145 (*)    All other components within normal limits  GLUCOSE, CAPILLARY - Abnormal; Notable for the following components:   Glucose-Capillary 199 (*)    All other components within normal limits  GLUCOSE, CAPILLARY - Abnormal; Notable for the following components:   Glucose-Capillary 204 (*)    All other components within normal limits  GLUCOSE, CAPILLARY - Abnormal; Notable for the following components:   Glucose-Capillary 226 (*)    All other components within normal limits  CBG MONITORING, ED - Abnormal; Notable for the following components:   Glucose-Capillary 141 (*)    All other components within normal limits  HIV ANTIBODY (ROUTINE TESTING W REFLEX)  HEPATITIS B SURFACE ANTIGEN  HEPATITIS B SURFACE ANTIBODY, QUANTITATIVE  GLUCOSE, CAPILLARY  RAPID URINE DRUG SCREEN, HOSP PERFORMED  TROPONIN I (HIGH SENSITIVITY)  TROPONIN I (HIGH SENSITIVITY)    EKG EKG Interpretation Date/Time:  Monday March 01 2023 16:46:30 EST Ventricular Rate:  85 PR Interval:  172 QRS Duration:  62 QT Interval:  386 QTC Calculation: 459 R Axis:   60  Text Interpretation: Normal sinus rhythm Normal ECG Artifact No significant change since last tracing When compared with ECG of 03-Nov-2021 08:32, PREVIOUS ECG IS PRESENT Confirmed by Gwyneth Sprout ([id number]) on 03/01/2023 4:54:49 PM  Radiology No results found.  Procedures Procedures    Medications Ordered in ED Medications  tetracaine (PONTOCAINE) 0.5 % ophthalmic solution 1 drop (1 drop  Both Eyes Given 03/02/23 0906)  fluorescein ophthalmic strip 2 strip (2 strips Both Eyes Given 03/02/23 0906)  gadobutrol  (GADAVIST) 1 MMOL/ML injection 7 mL (7 mLs Intravenous Contrast Given 03/02/23 1239)  iohexol (OMNIPAQUE) 350 MG/ML injection 75 mL (75 mLs Intravenous Contrast Given 03/02/23 1522)   stroke: early stages of recovery book ( Does not apply Given 03/03/23 0951)  clopidogrel (PLAVIX) tablet 300 mg (300 mg Oral Given 03/02/23 2004)  heparin injection 2,500 Units (2,500 Units Dialysis Given 03/03/23 1341)  heparin injection 1,500 Units (1,500 Units Dialysis Given 03/03/23 1522)  traMADol (ULTRAM) tablet 50 mg (50 mg Oral Given 03/04/23 1135)    ED Course/ Medical Decision Making/ A&P Clinical Course as of 03/21/23 2008  Tue Mar 02, 2023  1511 Double vision 4 pm yesterday.  Found to have acute stroke.  Neurology has seen and recommends admission.  Awaiting hospitalist okay consultation  [AS]  1538 Discussed case with hospitalist will admit. [AS]    Clinical Course User Index [AS] Lula Olszewski Edsel Petrin, PA-C                                 Medical Decision Making Patient is a 62 year old female, here for double vision, that started around 4 PM.  She states she does not have it anymore, but went to get checked out.  Has been in the lobby for 10+ hours.  We will obtain a an MRI of her brain, as well as the CTA head/neck, to rule out any kind of infarction, or aneurysm.  She is on dialysis status and compliant with it.  Last had dialysis yesterday.  Amount and/or Complexity of Data Reviewed Labs:     Details: No acute changes Radiology: ordered.    Details: MRI shows evidence of an acute infarct, in the dorsal pons Discussion of management or test interpretation with external provider(s): Discussed with patient, she has an acute infarct, in the dorsal pons, likely resulting in the double vision.  She is out of the window, I contacted neurology, and they state they will evaluate.  I paged the attending twice, and still have not heard back, I have let the secretary know about this.  And Hilton Cork  will follow-up with hospitalist in regards to admission.  CRITICAL CARE Performed by: Pete Pelt   Total critical care time: 30 minutes  Critical care time was exclusive of separately billable procedures and treating other patients.  Critical care was necessary to treat or prevent imminent or life-threatening deterioration.  Critical care was time spent personally by me on the following activities: development of treatment plan with patient and/or surrogate as well as nursing, discussions with consultants, evaluation of patient's response to treatment, examination of patient, obtaining history from patient or surrogate, ordering and performing treatments and interventions, ordering and review of laboratory studies, ordering and review of radiographic studies, pulse oximetry and re-evaluation of patient's condition.   Risk Prescription drug management. Decision regarding hospitalization.   pression(s) / ED Diagnoses Final diagnoses:  Cerebrovascular accident (CVA), unspecified mechanism (HCC)    Rx / DC Orders ED Discharge Orders          Ordered    clopidogrel (PLAVIX) 75 MG tablet  Daily        03/04/23 1031    aspirin EC 81 MG tablet  Daily        03/04/23 1031    atorvastatin (LIPITOR) 40 MG  tablet  Daily        03/04/23 1031    Increase activity slowly        03/04/23 1031    Discharge instructions       Comments: Please call call MD or return to ER for similar or worsening recurring problem that brought you to hospital or if any fever,nausea/vomiting,abdominal pain, uncontrolled pain, chest pain,  shortness of breath or any other alarming symptoms.  Please follow-up your doctor as instructed in a week time and call the office for appointment.  Please avoid alcohol, smoking, or any other illicit substance and maintain healthy habits including taking your regular medications as prescribed.  You were cared for by a hospitalist during your hospital stay. If you have  any questions about your discharge medications or the care you received while you were in the hospital after you are discharged, you can call the unit and ask to speak with the hospitalist on call if the hospitalist that took care of you is not available.  Once you are discharged, your primary care physician will handle any further medical issues. Please note that NO REFILLS for any discharge medications will be authorized once you are discharged, as it is imperative that you return to your primary care physician (or establish a relationship with a primary care physician if you do not have one) for your aftercare needs so that they can reassess your need for medications and monitor your lab values   03/04/23 1031    Ambulatory referral to Neurology       Comments: Follow up with stroke clinic NP (Jessica Greenfield or Darrol Angel, if both not available, consider Manson Allan, or [facility]) at [facility] in about 4 weeks. Thanks.   03/03/23 1706              Pete Pelt, PA 03/02/23 1511    Lonell Grandchild, MD 03/02/23 1533    Pete Pelt, [place] 03/21/23 2008

## 2023-03-02 NOTE — Consult Note (Addendum)
NEUROLOGY CONSULT NOTE   Date of service: March 02, 2023 Patient Name: Latoya Walsh MRN:  [id number] DOB:  26-Aug-1960 Chief Complaint: "vision problems" Requesting Provider: Lonell Grandchild, MD  History of Present Illness  Latoya Walsh is a 62 y.o. female with pertinent PMH of HFrEF, T2DM, HTN, ESRD on MWF HD admitted with acute dorsal pons CVA.   She reports that while eating supper Sunday 12/08 she felt overwhelmingly hot, with upset stomach and generally unwell. This progressed around 5P to double vision that lasted through 12/09 HD during which her symptoms led to EMS bringing her in for further evaluation. She has felt generally weak in this time as well and denies focal weakness. She says her blood pressure was very high as well.  No history of MI or CVA but she does note that in the last few years she has had these same symptoms, though they resolved on their own and she never presented to ED or PCP for evaluation of the symptoms.  She smokes 1 cigarette per day at this time. Previously used illicit substances, citing the start of HD as when she stopped using.   She denies any missed medication doses with the exception of today while she has been at the hospital.  At time of my exam she states that her vision is back to baseline.  LKW: 12/08 5P Modified rankin score: 1-No significant post stroke disability and can perform usual duties with stroke symptoms IV Thrombolysis: No, outside of window EVT: No  NIHSS components Score: Comment  1a Level of Conscious 0[x] 1[] 2[] 3[]     1b LOC Questions 0[x] 1[] 2[]      1c LOC Commands 0[x] 1[] 2[]      2 Best Gaze 0[x] 1[] 2[]      3 Visual 0[x] 1[] 2[] 3[]     4 Facial Palsy 0[x] 1[] 2[] 3[]     5a Motor Arm - left 0[x] 1[] 2[] 3[] 4[] UN[]   5b Motor Arm - Right 0[x] 1[] 2[] 3[] 4[] UN[]   6a Motor Leg - Left 0[x] 1[] 2[] 3[] 4[] UN[]   6b Motor Leg - Right 0[x] 1[] 2[] 3[] 4[] UN[]   7 Limb Ataxia 0[x] 1[] 2[]  3[] UN[]    8 Sensory 0[x] 1[] 2[] UN[]     9 Best Language 0[x] 1[] 2[] 3[]     10 Dysarthria 0[x] 1[] 2[] UN[]     11  Extinct. and Inattention 0[x]  1[]  2[]       TOTAL:       ROS  Comprehensive ROS performed and pertinent positives documented in HPI   Past History   Past Medical History: Past Medical History:  Diagnosis Date   Allergic rhinitis    Anxiety    CHF (congestive heart failure) (HCC)    a. EF 30-35% by echo in 05/2020 b. EF at 45% in 09/2020   Chronic back pain    Chronic hepatitis C without hepatic coma (HCC)    COVID-19 virus infection 12/26/2019   Depression    Diabetes mellitus    Hepatitis C    Hypertension    Noncompliance    Poor appetite 07/17/2014   Substance abuse (HCC)    HX of drug use and alcohol use   Past Surgical History: Past Surgical History:  Procedure Laterality Date   APPENDECTOMY  2004   AV FISTULA PLACEMENT Left 11/26/2020   Procedure: LEFT ARM ARTERIOVENOUS (AV)  FISTULA CREATION;  Surgeon: Larina Earthly, MD;  Location: [facility];  Service: Vascular;  Laterality: Left;   AV FISTULA PLACEMENT Left 06/03/2021   Procedure: INSERTION OF LEFT UPPER ARM ARTERIOVENOUS (AV) GORE-TEX GRAFT;  Surgeon: Larina Earthly, MD;  Location: [facility];  Service: Vascular;  Laterality: Left;   EXCHANGE OF A DIALYSIS CATHETER Right 02/24/2022   Procedure: EXCHANGE OF A DIALYSIS CATHETER;  Surgeon: Larina Earthly, MD;  Location: [facility];  Service: Vascular;  Laterality: Right;   IR FLUORO GUIDE CV LINE RIGHT  12/25/2020   IR US GUIDE VASC ACCESS RIGHT  12/25/2020   LIGATION ARTERIOVENOUS GORTEX GRAFT Left 08/05/2021   Procedure: LIGATION OF LEFT ARM ARTERIOVENOUS GORTEX GRAFT;  Surgeon: Larina Earthly, MD;  Location: [facility];  Service: Vascular;  Laterality: Left;   Family History: Family History  Problem Relation Age of Onset   Diabetes Sister        Twin - AIDS    Social History  reports that she has been smoking cigarettes. She has never used smokeless tobacco. She  reports that she does not currently use alcohol after a past usage of about 40.0 standard drinks of alcohol per week. She reports that she does not currently use drugs after having used the following drugs: Marijuana and Cocaine.  Allergies: Allergies  Allergen Reactions   Penicillins Itching and Rash   Medications  No current facility-administered medications for this encounter.  Current Outpatient Medications:    albuterol (VENTOLIN HFA) 108 (90 Base) MCG/ACT inhaler, Inhale 2 puffs into the lungs every 4 (four) hours as needed for wheezing or shortness of breath., Disp: 18 g, Rfl: 1   busPIRone (BUSPAR) 5 MG tablet, Take 1 tablet (5 mg total) by mouth 2 (two) times daily., Disp: 60 tablet, Rfl: 3   D3-1000 25 MCG (1000 UT) capsule, Take 1,000 Units by mouth daily., Disp: , Rfl:    insulin glargine (LANTUS SOLOSTAR) 100 UNIT/ML Solostar Pen, Inject 38 Units into the skin at bedtime. (Patient taking differently: Inject 46 Units into the skin daily.), Disp: 15 mL, Rfl: 2   insulin lispro (HUMALOG KWIKPEN) 100 UNIT/ML KwikPen, Inject 6 Units into the skin 2 (two) times daily. (Patient taking differently: Inject 45 Units into the skin daily.), Disp: 15 mL, Rfl: 3   losartan (COZAAR) 50 MG tablet, Take 50 mg by mouth., Disp: , Rfl:    metoprolol succinate (TOPROL-XL) 50 MG 24 hr tablet, TAKE ONE TABLET BY MOUTH EVERY EVENING, Disp: 30 tablet, Rfl: 9   mirtazapine (REMERON) 15 MG tablet, TAKE ONE TABLET BY MOUTH EVERY EVENING, Disp: 90 tablet, Rfl: 1   rosuvastatin (CRESTOR) 10 MG tablet, Take 1 tablet (10 mg total) by mouth every morning. (Patient taking differently: Take 10 mg by mouth daily.), Disp: 90 tablet, Rfl: 1   Sevelamer Carbonate (RENVELA PO), Take 1 packet by mouth See admin instructions. 1 packet with each meal and with snacks, Disp: , Rfl:    amLODipine (NORVASC) 10 MG tablet, Take 10 mg by mouth daily. (Patient not taking: Reported on 03/02/2023), Disp: , Rfl:    Blood Glucose  Monitoring Suppl (ONETOUCH VERIO FLEX SYSTEM) w/Device KIT, USE TO TEST BLOOD SUGAR TWICE DAILY AS DIRECTED *REFILL REQUEST*, Disp: 1 kit, Rfl: 10   COMFORT EZ PEN NEEDLES 32G X 4 MM MISC, USE AS DIRECTED with insulin injections, Disp: 100 each, Rfl: 1   Continuous Glucose Sensor (FREESTYLE LIBRE 3 SENSOR) MISC, 1 Device by Does not apply  route continuous. Place 1 sensor on the skin every 14 days. Use to check glucose continuously, Disp: 6 each, Rfl: 3   docusate sodium (COLACE) 100 MG capsule, Take 1 capsule (100 mg total) by mouth daily as needed for mild constipation. (Patient not taking: Reported on 03/02/2023), Disp: 10 capsule, Rfl: 5   Insulin Pen Needle (PEN NEEDLES) 31G X 5 MM MISC, Use to inject insulin once daily, Disp: 90 each, Rfl: 3   Iron, Ferrous Sulfate, 325 (65 Fe) MG TABS, Take 325 mg by mouth daily. (Patient not taking: Reported on 03/02/2023), Disp: 30 tablet, Rfl: 3   sevelamer carbonate (RENVELA) 800 MG tablet, Take 800 mg by mouth 3 (three) times daily with meals. (Patient not taking: Reported on 03/02/2023), Disp: , Rfl:    sucroferric oxyhydroxide (VELPHORO) 500 MG chewable tablet, Chew 500 mg by mouth 3 (three) times daily with meals. (Patient not taking: Reported on 03/02/2023), Disp: , Rfl:   Vitals   Vitals:   03/02/23 1010 03/02/23 1015 03/02/23 1315 03/02/23 1316  BP: 119/73 116/78 121/65   Pulse: 77 83 79   Resp: 18 18 18    Temp: 97.7 F (36.5 C)   97.9 F (36.6 C)  TempSrc: Temporal   Temporal  SpO2: 100% 100% 100%     There is no height or weight on file to calculate BMI.  Physical Exam   Constitutional: Chronically ill appearing female, in no acute distress.  Psych: Affect appropriate to situation.  Eyes: No scleral injection.  HENT: No OP obstruction.  Head: Normocephalic.  Cardiovascular: Normal rate and regular rhythm.  Respiratory: Effort normal, non-labored breathing.  GI: Soft.  No distension. There is no tenderness.  Skin: WDI.    Neurologic Examination   Mental Status: Patient is awake, alert, oriented to self, location (hospital, not which hospital), time (month but not year) No signs of aphasia or neglect Cranial Nerves: II: Pupils equal, round, and reactive to light.  VFF III,IV, VI: EOMI without ptosis or diploplia.  V: Facial sensation is symmetric to light touch and temperature. VII: Facial movement is symmetric.  VIII: Hearing is intact to voice X: Uvula elevates symmetrically XI: Shoulder shrug is symmetric. XII: Tongue is midline without atrophy or fasciculations.  Motor: Equal effort thorughout, at least 5/5 bilateral UE, 5/5 bilateral LE  Sensory: Sensation is grossly intact bilateral UE & LE Cerebellar: Finger-Nose and Heel-Shin intact bilaterally  Labs/Imaging/Neurodiagnostic studies   CBC:  Recent Labs  Lab 2023-03-28 1639  WBC 8.2  NEUTROABS 4.6  HGB 11.3*  HCT 33.2*  MCV 92.2  PLT 155   Basic Metabolic Panel:  Lab Results  Component Value Date   NA 132 (L) 2023/03/28   K 4.1 03-28-23   CO2 24 March 28, 2023   GLUCOSE 336 (H) 03-28-23   BUN 19 March 28, 2023   CREATININE 4.10 (H) 2023-03-28   CALCIUM 8.4 (L) 03/28/23   GFRNONAA 12 (L) 2023-03-28   GFRAA 31 (L) 05/02/2020   Lipid Panel:  Lab Results  Component Value Date   LDLCALC 63 02/11/2023   HgbA1c:  Lab Results  Component Value Date   HGBA1C 9.5 (H) 02/11/2023   Urine Drug Screen:     Component Value Date/Time   LABOPIA NONE DETECTED 01/17/2020 1642   COCAINSCRNUR NONE DETECTED 01/17/2020 1642   COCAINSCRNUR POS (A) 05/02/2012 0929   LABBENZ NONE DETECTED 01/17/2020 1642   LABBENZ NEG 05/02/2012 0929   AMPHETMU NONE DETECTED 01/17/2020 1642   THCU NONE DETECTED 01/17/2020 1642  LABBARB NONE DETECTED 01/17/2020 1642    Alcohol Level No results found for: "ETH" INR  Lab Results  Component Value Date   INR 1.1 11/03/2021   APTT  Lab Results  Component Value Date   APTT 23 (L) 11/03/2021   AED  levels: No results found for: "PHENYTOIN", "ZONISAMIDE", "LAMOTRIGINE", "LEVETIRACETA"  CT Head without contrast(Personally reviewed): No evidence of acute intracranial abnormality.   CT angio Head and Neck with contrast(Personally reviewed): Ordered, pending.  MRI Brain(Personally reviewed): 1. Focal acute infarction in the dorsal pons just to the right of midline. 2. Extensive chronic small-vessel ischemic changes throughout the pons, middle cerebellar peduncles and cerebral hemispheric white matter. Small-vessel disease is favored in this patient with renal failure, diabetes and hypertension. Demyelinating disease is not excluded but much less likely.  ASSESSMENT   Latoya Walsh is a 62 y.o. female with pertinent PMH of HFrEF, T2DM, HTN, ESRD on MWF HD admitted with acute dorsal pons CVA.   Double vision is consistent with this area of infarction, likely from microvascular ischemia. Given recurrent nature of this symptom and medical history I do suspect she has had CVA versus sequela of chronic ischemic changes to this area.  RECOMMENDATIONS   -Check lipid panel, HbA1c  -High intensity statin, LDL goal <70 -Permissive HTN over 24-48h, goal SBP <220 -Risk factor management as above -CTA head and neck -Echocardiogram -PT, OT, SLP -ASA 81 mg daily indefinitely, plavix 300 mg loading dose x1 followed by 75 mg daily for 21 days -Telemetry monitoring -Encourage smoking cessations -Stroke team to follow ______________________________________________________________________  Signed, Champ Mungo, DO Internal Medicine PGY-3  I have seen the patient reviewed the above note.  There is a small pontine infarct that is likely small vessel in etiology.  She will need secondary stroke prevention with risk factor modification.  Workup as above, stroke team to follow.  Ritta Slot, MD [facility] [phone]  If 7pm- 7am, please page neurology on call as listed in  [facility].

## 2023-03-02 NOTE — ED Notes (Signed)
Patient transported to MRI 

## 2023-03-02 NOTE — Hospital Course (Signed)
Pertinent PMH of T2DM, HTN, HFrEF (EF 30-35%)*** ESRD on MWF HD, HLD, anemia of chronic disease, GAD, depression who presented with *** found to have acute *** CVA.

## 2023-03-03 ENCOUNTER — Observation Stay (HOSPITAL_COMMUNITY): Payer: Medicaid Other

## 2023-03-03 DIAGNOSIS — Z7902 Long term (current) use of antithrombotics/antiplatelets: Secondary | ICD-10-CM | POA: Diagnosis not present

## 2023-03-03 DIAGNOSIS — I132 Hypertensive heart and chronic kidney disease with heart failure and with stage 5 chronic kidney disease, or end stage renal disease: Secondary | ICD-10-CM | POA: Diagnosis present

## 2023-03-03 DIAGNOSIS — Z79899 Other long term (current) drug therapy: Secondary | ICD-10-CM | POA: Diagnosis not present

## 2023-03-03 DIAGNOSIS — H532 Diplopia: Secondary | ICD-10-CM | POA: Diagnosis present

## 2023-03-03 DIAGNOSIS — R29702 NIHSS score 2: Secondary | ICD-10-CM | POA: Diagnosis present

## 2023-03-03 DIAGNOSIS — I6329 Cerebral infarction due to unspecified occlusion or stenosis of other precerebral arteries: Secondary | ICD-10-CM | POA: Diagnosis present

## 2023-03-03 DIAGNOSIS — Z794 Long term (current) use of insulin: Secondary | ICD-10-CM | POA: Diagnosis not present

## 2023-03-03 DIAGNOSIS — Z992 Dependence on renal dialysis: Secondary | ICD-10-CM | POA: Diagnosis not present

## 2023-03-03 DIAGNOSIS — I120 Hypertensive chronic kidney disease with stage 5 chronic kidney disease or end stage renal disease: Secondary | ICD-10-CM | POA: Diagnosis not present

## 2023-03-03 DIAGNOSIS — I639 Cerebral infarction, unspecified: Secondary | ICD-10-CM | POA: Diagnosis not present

## 2023-03-03 DIAGNOSIS — Z7982 Long term (current) use of aspirin: Secondary | ICD-10-CM | POA: Diagnosis not present

## 2023-03-03 DIAGNOSIS — F32A Depression, unspecified: Secondary | ICD-10-CM | POA: Diagnosis present

## 2023-03-03 DIAGNOSIS — Z8616 Personal history of COVID-19: Secondary | ICD-10-CM | POA: Diagnosis not present

## 2023-03-03 DIAGNOSIS — E1122 Type 2 diabetes mellitus with diabetic chronic kidney disease: Secondary | ICD-10-CM | POA: Diagnosis present

## 2023-03-03 DIAGNOSIS — F419 Anxiety disorder, unspecified: Secondary | ICD-10-CM | POA: Diagnosis present

## 2023-03-03 DIAGNOSIS — Z833 Family history of diabetes mellitus: Secondary | ICD-10-CM | POA: Diagnosis not present

## 2023-03-03 DIAGNOSIS — Z831 Family history of other infectious and parasitic diseases: Secondary | ICD-10-CM | POA: Diagnosis not present

## 2023-03-03 DIAGNOSIS — I6389 Other cerebral infarction: Secondary | ICD-10-CM | POA: Diagnosis not present

## 2023-03-03 DIAGNOSIS — D631 Anemia in chronic kidney disease: Secondary | ICD-10-CM | POA: Diagnosis not present

## 2023-03-03 DIAGNOSIS — Z880 Allergy status to penicillin: Secondary | ICD-10-CM | POA: Diagnosis not present

## 2023-03-03 DIAGNOSIS — M898X9 Other specified disorders of bone, unspecified site: Secondary | ICD-10-CM | POA: Diagnosis present

## 2023-03-03 DIAGNOSIS — E785 Hyperlipidemia, unspecified: Secondary | ICD-10-CM | POA: Diagnosis present

## 2023-03-03 DIAGNOSIS — I5042 Chronic combined systolic (congestive) and diastolic (congestive) heart failure: Secondary | ICD-10-CM | POA: Diagnosis present

## 2023-03-03 DIAGNOSIS — E1165 Type 2 diabetes mellitus with hyperglycemia: Secondary | ICD-10-CM | POA: Diagnosis present

## 2023-03-03 DIAGNOSIS — N186 End stage renal disease: Secondary | ICD-10-CM | POA: Diagnosis not present

## 2023-03-03 DIAGNOSIS — F17210 Nicotine dependence, cigarettes, uncomplicated: Secondary | ICD-10-CM | POA: Diagnosis present

## 2023-03-03 LAB — CBC WITH DIFFERENTIAL/PLATELET
Abs Immature Granulocytes: 0.04 10*3/uL (ref 0.00–0.07)
Basophils Absolute: 0.1 10*3/uL (ref 0.0–0.1)
Basophils Relative: 1 %
Eosinophils Absolute: 0.3 10*3/uL (ref 0.0–0.5)
Eosinophils Relative: 4 %
HCT: 32.2 % — ABNORMAL LOW (ref 36.0–46.0)
Hemoglobin: 10.8 g/dL — ABNORMAL LOW (ref 12.0–15.0)
Immature Granulocytes: 1 %
Lymphocytes Relative: 39 %
Lymphs Abs: 3.1 10*3/uL (ref 0.7–4.0)
MCH: 30.9 pg (ref 26.0–34.0)
MCHC: 33.5 g/dL (ref 30.0–36.0)
MCV: 92 fL (ref 80.0–100.0)
Monocytes Absolute: 0.5 10*3/uL (ref 0.1–1.0)
Monocytes Relative: 7 %
Neutro Abs: 3.8 10*3/uL (ref 1.7–7.7)
Neutrophils Relative %: 48 %
Platelets: 157 10*3/uL (ref 150–400)
RBC: 3.5 MIL/uL — ABNORMAL LOW (ref 3.87–5.11)
RDW: 13.6 % (ref 11.5–15.5)
WBC: 7.8 10*3/uL (ref 4.0–10.5)
nRBC: 0 % (ref 0.0–0.2)

## 2023-03-03 LAB — LIPID PANEL
Cholesterol: 109 mg/dL (ref 0–200)
HDL: 22 mg/dL — ABNORMAL LOW (ref >40)
LDL Cholesterol: 54 mg/dL (ref 0–99)
Total CHOL/HDL Ratio: 5 {ratio}
Triglycerides: 163 mg/dL — ABNORMAL HIGH (ref <150)
VLDL: 33 mg/dL (ref 0–40)

## 2023-03-03 LAB — RENAL FUNCTION PANEL
Albumin: 3.2 g/dL — ABNORMAL LOW (ref 3.5–5.0)
Anion gap: 12 (ref 5–15)
BUN: 35 mg/dL — ABNORMAL HIGH (ref 6–20)
CO2: 22 mmol/L (ref 22–32)
Calcium: 8.9 mg/dL (ref 8.9–10.3)
Chloride: 102 mmol/L (ref 98–111)
Creatinine, Ser: 7.91 mg/dL — ABNORMAL HIGH (ref 0.44–1.00)
GFR, Estimated: 5 mL/min — ABNORMAL LOW (ref >60)
Glucose, Bld: 84 mg/dL (ref 70–99)
Phosphorus: 5.8 mg/dL — ABNORMAL HIGH (ref 2.5–4.6)
Potassium: 4.7 mmol/L (ref 3.5–5.1)
Sodium: 136 mmol/L (ref 135–145)

## 2023-03-03 LAB — ECHOCARDIOGRAM COMPLETE
AR max vel: 1.94 cm2
AV Area VTI: 1.84 cm2
AV Area mean vel: 1.76 cm2
AV Mean grad: 3 mm[Hg]
AV Peak grad: 5.1 mm[Hg]
Ao pk vel: 1.13 m/s
Area-P 1/2: 4.21 cm2
Height: 67 in
S' Lateral: 2.9 cm
Weight: 2608 [oz_av]

## 2023-03-03 LAB — GLUCOSE, CAPILLARY
Glucose-Capillary: 111 mg/dL — ABNORMAL HIGH (ref 70–99)
Glucose-Capillary: 145 mg/dL — ABNORMAL HIGH (ref 70–99)
Glucose-Capillary: 241 mg/dL — ABNORMAL HIGH (ref 70–99)
Glucose-Capillary: 262 mg/dL — ABNORMAL HIGH (ref 70–99)
Glucose-Capillary: 82 mg/dL (ref 70–99)

## 2023-03-03 LAB — HEMOGLOBIN A1C
Hgb A1c MFr Bld: 9.3 % — ABNORMAL HIGH (ref 4.8–5.6)
Mean Plasma Glucose: 220.21 mg/dL

## 2023-03-03 LAB — RAPID URINE DRUG SCREEN, HOSP PERFORMED
Amphetamines: NOT DETECTED
Barbiturates: NOT DETECTED
Benzodiazepines: NOT DETECTED
Cocaine: NOT DETECTED
Opiates: NOT DETECTED
Tetrahydrocannabinol: NOT DETECTED

## 2023-03-03 LAB — HIV ANTIBODY (ROUTINE TESTING W REFLEX): HIV Screen 4th Generation wRfx: NONREACTIVE

## 2023-03-03 LAB — HEPATITIS B SURFACE ANTIGEN: Hepatitis B Surface Ag: NONREACTIVE

## 2023-03-03 LAB — PHOSPHORUS: Phosphorus: 5.8 mg/dL — ABNORMAL HIGH (ref 2.5–4.6)

## 2023-03-03 MED ORDER — CHLORHEXIDINE GLUCONATE CLOTH 2 % EX PADS: 6.0000 | MEDICATED_PAD | Freq: Every day | CUTANEOUS | Status: DC

## 2023-03-03 MED ORDER — LIDOCAINE HCL (PF) 1 % IJ SOLN: 5.0000 mL | INTRAMUSCULAR | Status: DC | PRN

## 2023-03-03 MED ORDER — PENTAFLUOROPROP-TETRAFLUOROETH EX AERO: 1.0000 | INHALATION_SPRAY | CUTANEOUS | Status: DC | PRN

## 2023-03-03 MED ORDER — HEPARIN SODIUM (PORCINE) 1000 UNIT/ML DIALYSIS
2500.0000 [IU] | Freq: Once | INTRAMUSCULAR | Status: AC
  Administered 2023-03-03: 2500 [IU] via INTRAVENOUS_CENTRAL
  Filled 2023-03-03: qty 3

## 2023-03-03 MED ORDER — INSULIN ASPART 100 UNIT/ML IJ SOLN: 0.0000 [IU] | Freq: Every day | INTRAMUSCULAR | Status: DC

## 2023-03-03 MED ORDER — CHLORHEXIDINE GLUCONATE CLOTH 2 % EX PADS
6.0000 | MEDICATED_PAD | Freq: Every day | CUTANEOUS | Status: DC
  Administered 2023-03-03 – 2023-03-04 (×2): 6 via TOPICAL

## 2023-03-03 MED ORDER — INSULIN ASPART 100 UNIT/ML IJ SOLN
0.0000 [IU] | Freq: Three times a day (TID) | INTRAMUSCULAR | Status: DC
  Administered 2023-03-04 (×2): 3 [IU] via SUBCUTANEOUS

## 2023-03-03 MED ORDER — NICOTINE POLACRILEX 2 MG MT GUM
2.0000 mg | CHEWING_GUM | OROMUCOSAL | Status: DC | PRN
  Filled 2023-03-03: qty 1

## 2023-03-03 MED ORDER — LIDOCAINE-PRILOCAINE 2.5-2.5 % EX CREA: 1.0000 | TOPICAL_CREAM | CUTANEOUS | Status: DC | PRN

## 2023-03-03 MED ORDER — HEPARIN SODIUM (PORCINE) 1000 UNIT/ML DIALYSIS
1000.0000 [IU] | INTRAMUSCULAR | Status: DC | PRN
  Administered 2023-03-03: 3800 [IU]
  Filled 2023-03-03: qty 1

## 2023-03-03 MED ORDER — ATORVASTATIN CALCIUM 40 MG PO TABS
40.0000 mg | ORAL_TABLET | Freq: Every day | ORAL | Status: DC
Start: 2023-03-04 — End: 2023-03-04
  Administered 2023-03-04: 40 mg via ORAL
  Filled 2023-03-03: qty 1

## 2023-03-03 MED ORDER — HEPARIN SODIUM (PORCINE) 1000 UNIT/ML DIALYSIS
1500.0000 [IU] | INTRAMUSCULAR | Status: AC | PRN
  Administered 2023-03-03: 1500 [IU] via INTRAVENOUS_CENTRAL
  Filled 2023-03-03: qty 2

## 2023-03-03 MED ORDER — ALTEPLASE 2 MG IJ SOLR: 2.0000 mg | Freq: Once | INTRAMUSCULAR | Status: DC | PRN

## 2023-03-03 MED ORDER — NEPRO/CARBSTEADY PO LIQD: 237.0000 mL | ORAL | Status: DC | PRN

## 2023-03-03 MED ORDER — ANTICOAGULANT SODIUM CITRATE 4% (200MG/5ML) IV SOLN: 5.0000 mL | Status: DC | PRN

## 2023-03-03 NOTE — Progress Notes (Signed)
Pt has returned to 3W-[facility] from dialysis

## 2023-03-03 NOTE — Consult Note (Addendum)
Renal Service Consult Note [facility]  Latoya Walsh 03/03/2023 [name], MD Requesting Physician: Dr. Sharolyn Douglas  Reason for Consult: ESRD pt w/ visual disturbance HPI: The patient is a 62 y.o. year-old w/ PMH as below who presented to ED 12/09 c/o double vision. H/o DM, esrd on HD. No hx CVA. Had HD 12/09 w/o any improvement. In ED MRI showed focal acute CVA in the pons. CTA head/ neck showed no LVO. CXR neg. Neurology consulted and pt admitted. We are asked to see for ESRD.    Pt seen in room. Lives w/ her brother. Takes the [place] to dialysis. No c/o's at this time.   ROS - denies CP, no joint pain, no HA, no blurry vision, no rash, no diarrhea, no nausea/ vomiting, no dysuria, no difficulty voiding   Past Medical History  Past Medical History:  Diagnosis Date   Allergic rhinitis    Anxiety    CHF (congestive heart failure) (HCC)    a. EF 30-35% by echo in 05/2020 b. EF at 45% in 09/2020   Chronic back pain    Chronic hepatitis C without hepatic coma (HCC)    COVID-19 virus infection 12/26/2019   Depression    Diabetes mellitus    Hepatitis C    Hypertension    Noncompliance    Poor appetite 07/17/2014   Substance abuse (HCC)    HX of drug use and alcohol use   Past Surgical History  Past Surgical History:  Procedure Laterality Date   APPENDECTOMY  2004   AV FISTULA PLACEMENT Left 11/26/2020   Procedure: LEFT ARM ARTERIOVENOUS (AV) FISTULA CREATION;  Surgeon: Larina Earthly, MD;  Location: [facility];  Service: Vascular;  Laterality: Left;   AV FISTULA PLACEMENT Left 06/03/2021   Procedure: INSERTION OF LEFT UPPER ARM ARTERIOVENOUS (AV) GORE-TEX GRAFT;  Surgeon: Larina Earthly, MD;  Location: [facility];  Service: Vascular;  Laterality: Left;   EXCHANGE OF A DIALYSIS CATHETER Right 02/24/2022   Procedure: EXCHANGE OF A DIALYSIS CATHETER;  Surgeon: Larina Earthly, MD;  Location: [facility];  Service: Vascular;  Laterality: Right;   IR FLUORO GUIDE CV LINE RIGHT   12/25/2020   IR US GUIDE VASC ACCESS RIGHT  12/25/2020   LIGATION ARTERIOVENOUS GORTEX GRAFT Left 08/05/2021   Procedure: LIGATION OF LEFT ARM ARTERIOVENOUS GORTEX GRAFT;  Surgeon: Larina Earthly, MD;  Location: [facility];  Service: Vascular;  Laterality: Left;   Family History  Family History  Problem Relation Age of Onset   Diabetes Sister        Twin - AIDS    Social History  reports that she has been smoking cigarettes. She has never used smokeless tobacco. She reports that she does not currently use alcohol after a past usage of about 40.0 standard drinks of alcohol per week. She reports that she does not currently use drugs after having used the following drugs: Marijuana and Cocaine. Allergies  Allergies  Allergen Reactions   Penicillins Itching and Rash   Home medications Prior to Admission medications   Medication Sig Start Date End Date Taking? Authorizing Provider  albuterol (VENTOLIN HFA) 108 (90 Base) MCG/ACT inhaler Inhale 2 puffs into the lungs every 4 (four) hours as needed for wheezing or shortness of breath. 11/05/21  Yes Shah, Pratik D, DO  busPIRone (BUSPAR) 5 MG tablet Take 1 tablet (5 mg total) by mouth 2 (two) times daily. 12/31/22  Yes [name], [name], NP  D3-1000 25  MCG (1000 UT) capsule Take 1,000 Units by mouth daily. 05/12/22  Yes [provider]  insulin glargine (LANTUS SOLOSTAR) 100 UNIT/ML Solostar Pen Inject 38 Units into the skin at bedtime. Patient taking differently: Inject 46 Units into the skin daily. 12/31/22  Yes [name], [name], NP  insulin lispro (HUMALOG KWIKPEN) 100 UNIT/ML KwikPen Inject 6 Units into the skin 2 (two) times daily. Patient taking differently: Inject 45 Units into the skin daily. 02/25/23 05/26/23 Yes Motwani, Komal, MD  losartan (COZAAR) 50 MG tablet Take 50 mg by mouth.   Yes [provider]  metoprolol succinate (TOPROL-XL) 50 MG 24 hr tablet TAKE ONE TABLET BY MOUTH EVERY EVENING 08/10/22  Yes Branch, [name], MD   mirtazapine (REMERON) 15 MG tablet TAKE ONE TABLET BY MOUTH EVERY EVENING 11/17/22  Yes [name], [name], NP  rosuvastatin (CRESTOR) 10 MG tablet Take 1 tablet (10 mg total) by mouth every morning. Patient taking differently: Take 10 mg by mouth daily. 05/19/22  Yes [name], [name], NP  Sevelamer Carbonate (RENVELA PO) Take 1 packet by mouth See admin instructions. 1 packet with each meal and with snacks   Yes [provider]  amLODipine (NORVASC) 10 MG tablet Take 10 mg by mouth daily. Patient not taking: Reported on 03/02/2023 02/10/21   [provider]  Blood Glucose Monitoring Suppl (ONETOUCH VERIO FLEX SYSTEM) w/Device KIT USE TO TEST BLOOD SUGAR TWICE DAILY AS DIRECTED *REFILL REQUEST* 11/20/22   [name], [name], NP  COMFORT EZ PEN NEEDLES 32G X 4 MM MISC USE AS DIRECTED with insulin injections 12/29/21   Reardon, Freddi Starr, NP  Continuous Glucose Sensor (FREESTYLE LIBRE 3 SENSOR) MISC 1 Device by Does not apply route continuous. Place 1 sensor on the skin every 14 days. Use to check glucose continuously 01/07/23   Altamese Meno, MD  docusate sodium (COLACE) 100 MG capsule Take 1 capsule (100 mg total) by mouth daily as needed for mild constipation. Patient not taking: Reported on 03/02/2023 02/23/23   [name], [name], NP  Insulin Pen Needle (PEN NEEDLES) 31G X 5 MM MISC Use to inject insulin once daily 11/04/20   [name], NP  Iron, Ferrous Sulfate, 325 (65 Fe) MG TABS Take 325 mg by mouth daily. Patient not taking: Reported on 03/02/2023 02/23/23   [name], [name], NP  sevelamer carbonate (RENVELA) 800 MG tablet Take 800 mg by mouth 3 (three) times daily with meals. Patient not taking: Reported on 03/02/2023    [provider]  sucroferric oxyhydroxide (VELPHORO) 500 MG chewable tablet Chew 500 mg by mouth 3 (three) times daily with meals. Patient not taking: Reported on 03/02/2023    [provider]     Vitals:   03/02/23 2015 03/02/23 2030  03/02/23 2053 03/02/23 2358  BP: 132/66 (!) 130/45 (!) 153/120 (!) 123/41  Pulse: 86 80 88 77  Resp: 17 10 18    Temp:   98.9 F (37.2 C) 98.3 F (36.8 C)  TempSrc:   Oral Oral  SpO2: 100% 98% 100% 100%  Weight:      Height:       Exam Gen alert, no distress No rash, cyanosis or gangrene Sclera anicteric, throat clear  No jvd or bruits Chest clear bilat to bases, no rales/ wheezing RRR no MRG Abd soft ntnd no mass or ascites +bs GU defer MS no joint effusions or deformity Ext no LE or UE edema, no wounds or ulcers Neuro is alert, Ox 3 ,  nf    LUA AVF(maturing) / RIJ TDC       Renal-related home meds: - losartan 50  - toprol xl 50 every day - renvela ? - norvasc 10 every day - velphoro 500 ac tid - others: remeron, buspar, insulins    OP HD: MWF [facility]  4h  400/1.5   72.5kg   2/2.5 bath  TDC   Heparin 4000 - last OP HD 12/09, post wt 72.9kg - has been getting to dry wt, good compliance   Assessment/ Plan: Acute CVA - per neuro/ pmd ESKD - on HD MWF.  HD orders written for today/ tonight as staffing allows.  HTN  - bp's are stable, cont home meds.  Volume - up1.5kg by wts, no vol excess on exam. UF goal same.  Anemia of eskd - Hb 11, follow. No esa needs.  MBD ckd - CCa in range, add on phos.       Vinson Moselle  MD [facility] 03/03/2023, 12:30 PM  Recent Labs  Lab 03/01/23 1639  HGB 11.3*  ALBUMIN 3.4*  CALCIUM 8.4*  CREATININE 4.10*  K 4.1   Inpatient medications:   stroke: early stages of recovery book   Does not apply Once   aspirin EC  81 mg Oral Daily   busPIRone  5 mg Oral BID   clopidogrel  75 mg Oral Daily   heparin  5,000 Units Subcutaneous Q8H   insulin aspart  0-9 Units Subcutaneous Q4H   mirtazapine  15 mg Oral QPM   rosuvastatin  20 mg Oral q morning    acetaminophen **OR** acetaminophen (TYLENOL) oral liquid 160 mg/5 mL **OR** acetaminophen, albuterol, ondansetron (ZOFRAN) IV, senna-docusate

## 2023-03-03 NOTE — Plan of Care (Signed)
  Problem: Education: Goal: Ability to describe self-care measures that may prevent or decrease complications (Diabetes Survival Skills Education) will improve Outcome: Progressing Goal: Individualized Educational Video(s) Outcome: Progressing   Problem: Coping: Goal: Ability to adjust to condition or change in health will improve Outcome: Progressing   Problem: Fluid Volume: Goal: Ability to maintain a balanced intake and output will improve Outcome: Progressing   Problem: Health Behavior/Discharge Planning: Goal: Ability to identify and utilize available resources and services will improve Outcome: Progressing Goal: Ability to manage health-related needs will improve Outcome: Progressing   Problem: Metabolic: Goal: Ability to maintain appropriate glucose levels will improve Outcome: Progressing   Problem: Nutritional: Goal: Maintenance of adequate nutrition will improve Outcome: Progressing Goal: Progress toward achieving an optimal weight will improve Outcome: Progressing   Problem: Skin Integrity: Goal: Risk for impaired skin integrity will decrease Outcome: Progressing   Problem: Tissue Perfusion: Goal: Adequacy of tissue perfusion will improve Outcome: Progressing   Problem: Education: Goal: Knowledge of General Education information will improve Description: Including pain rating scale, medication(s)/side effects and non-pharmacologic comfort measures Outcome: Progressing   Problem: Health Behavior/Discharge Planning: Goal: Ability to manage health-related needs will improve Outcome: Progressing   Problem: Clinical Measurements: Goal: Ability to maintain clinical measurements within normal limits will improve Outcome: Progressing Goal: Will remain free from infection Outcome: Progressing Goal: Diagnostic test results will improve Outcome: Progressing Goal: Respiratory complications will improve Outcome: Progressing Goal: Cardiovascular complication will  be avoided Outcome: Progressing   Problem: Activity: Goal: Risk for activity intolerance will decrease Outcome: Progressing   Problem: Nutrition: Goal: Adequate nutrition will be maintained Outcome: Progressing   Problem: Coping: Goal: Level of anxiety will decrease Outcome: Progressing   Problem: Elimination: Goal: Will not experience complications related to bowel motility Outcome: Progressing Goal: Will not experience complications related to urinary retention Outcome: Progressing   Problem: Pain Management: Goal: General experience of comfort will improve Outcome: Progressing   Problem: Safety: Goal: Ability to remain free from injury will improve Outcome: Progressing   Problem: Skin Integrity: Goal: Risk for impaired skin integrity will decrease Outcome: Progressing   Problem: Education: Goal: Knowledge of disease or condition will improve Outcome: Progressing Goal: Knowledge of secondary prevention will improve (MUST DOCUMENT ALL) Outcome: Progressing Goal: Knowledge of patient specific risk factors will improve Loraine Leriche N/A or DELETE if not current risk factor) Outcome: Progressing   Problem: Ischemic Stroke/TIA Tissue Perfusion: Goal: Complications of ischemic stroke/TIA will be minimized Outcome: Progressing   Problem: Coping: Goal: Will verbalize positive feelings about self Outcome: Progressing Goal: Will identify appropriate support needs Outcome: Progressing   Problem: Health Behavior/Discharge Planning: Goal: Ability to manage health-related needs will improve Outcome: Progressing Goal: Goals will be collaboratively established with patient/family Outcome: Progressing   Problem: Self-Care: Goal: Ability to participate in self-care as condition permits will improve Outcome: Progressing Goal: Verbalization of feelings and concerns over difficulty with self-care will improve Outcome: Progressing Goal: Ability to communicate needs accurately will  improve Outcome: Progressing   Problem: Nutrition: Goal: Risk of aspiration will decrease Outcome: Progressing Goal: Dietary intake will improve Outcome: Progressing

## 2023-03-03 NOTE — Progress Notes (Signed)
  Echocardiogram 2D Echocardiogram has been performed.  Latoya Walsh 03/03/2023, 8:55 AM

## 2023-03-03 NOTE — Progress Notes (Signed)
PT Cancellation Note  Patient Details Name: Latoya Walsh MRN: [id number] DOB: 03/14/1961   Cancelled Treatment:    Reason Eval/Treat Not Completed: Patient at procedure or test/unavailable  Patient undergoing echo.   Jerolyn Center, PT Acute Rehabilitation Services  Office [phone]   [name] Amos 03/03/2023, 8:46 AM

## 2023-03-03 NOTE — TOC Initial Note (Signed)
Transition of Care [facility]) - Initial/Assessment Note    Patient Details  Name: Latoya Walsh MRN: [id number] Date of Birth: 08/10/1960  Transition of Care [facility]) CM/SW Contact:    Kermit Balo, RN Phone Number: 03/03/2023, 11:48 AM  Clinical Narrative:                  Pt is from home with her brother.  She goes to HD MWF and uses van transportation.  She denies issues with home medications.  No f/u per PT.  Pt may need transportation home. TOC following.  Expected Discharge Plan: Home/Self Care Barriers to Discharge: Continued Medical Work up   Patient Goals and CMS Choice            Expected Discharge Plan and Services       Living arrangements for the past 2 months: Single Family Home                                      Prior Living Arrangements/Services Living arrangements for the past 2 months: Single Family Home Lives with:: Siblings Patient language and need for interpreter reviewed:: Yes Do you feel safe going back to the place where you live?: Yes          Current home services: DME (walker) Criminal Activity/Legal Involvement Pertinent to Current Situation/Hospitalization: No - Comment as needed  Activities of Daily Living   ADL Screening (condition at time of admission) Independently performs ADLs?: No Does the patient have a NEW difficulty with bathing/dressing/toileting/self-feeding that is expected to last >3 days?: Yes (Initiates electronic notice to provider for possible OT consult) (new stroke) Does the patient have a NEW difficulty with getting in/out of bed, walking, or climbing stairs that is expected to last >3 days?: Yes (Initiates electronic notice to provider for possible PT consult) (new stroke) Does the patient have a NEW difficulty with communication that is expected to last >3 days?: No Is the patient deaf or have difficulty hearing?: No Does the patient have difficulty seeing, even when wearing glasses/contacts?:  Yes Does the patient have difficulty concentrating, remembering, or making decisions?: Yes  Permission Sought/Granted                  Emotional Assessment Appearance:: Appears stated age Attitude/Demeanor/Rapport: Engaged Affect (typically observed): Accepting Orientation: : Oriented to Self, Oriented to Place, Oriented to  Time, Oriented to Situation   Psych Involvement: No (comment)  Admission diagnosis:  Acute CVA (cerebrovascular accident) [facility]) [I63.9] Cerebrovascular accident (CVA), unspecified mechanism (HCC) [I63.9] Patient Active Problem List   Diagnosis Date Noted   Acute CVA (cerebrovascular accident) (HCC) 03/02/2023   Secondary hyperparathyroidism (HCC) 12/31/2022   ESRD on dialysis (HCC) 01/06/2021   CHF (congestive heart failure) (HCC) 06/06/2020   Leg swelling 04/28/2020   Hyperlipidemia LDL goal <100 04/28/2020   Left hand pain 03/10/2020   Anemia 02/19/2020   COVID-19 virus infection 12/26/2019   Depression, major, single episode, in partial remission (HCC) 01/25/2019   Headache 11/13/2018   Insomnia 09/25/2018   Screening for colorectal cancer 07/23/2016   Lumbar back pain with radiculopathy affecting right lower extremity 03/27/2015   Non compliance with medical treatment 07/22/2014   Chronic hepatitis C without hepatic coma (HCC) 12/04/2013   Allergic rhinitis 11/03/2012   Anxiety and depression 02/05/2011   Nicotine dependence 11/01/2010   Dermatitis 10/30/2010   Drug dependence, continuous abuse (HCC)  03/22/2010   Uncontrolled type 2 diabetes mellitus with hyperglycemia (HCC) 06/02/2007   Alcohol abuse 06/02/2007   PCP:  Caesar Bookman, NP Pharmacy:   [facility] - [place], [place] - [address] [place] [address] Phone: [phone] Fax: [phone]  [facility] - [address], [place] - [address] [place] [address] Phone: [phone]  Fax: [phone]  [facility] [place], [place] - [address] [place] [address] Phone: [phone] Fax: [phone]  [id number] [place], [place] - [address] AT [facility] [address] [place] [address] Phone: [phone] Fax: [phone]  [facility] [address] [place] [address] Phone: [phone] Fax: [phone]     Social Determinants of Health (SDOH) Social History: SDOH Screenings   Food Insecurity: Food Insecurity Present (03/02/2023)  Housing: Low Risk  (03/02/2023)  Transportation Needs: Unmet Transportation Needs (03/02/2023)  Utilities: Not At Risk (03/02/2023)  Alcohol Screen: Low Risk  (12/24/2020)  Depression (PHQ2-9): High Risk (02/23/2023)  Financial Resource Strain: Low Risk  (07/26/2020)  Physical Activity: Inactive (12/19/2020)  Social Connections: Moderately Integrated (12/19/2020)  Stress: Stress Concern Present (08/21/2021)  Tobacco Use: High Risk (03/01/2023)   SDOH Interventions:     Readmission Risk Interventions    11/04/2021   12:21 PM 06/09/2020    1:09 PM  Readmission Risk Prevention Plan  Transportation Screening Complete Complete  HRI or Home Care Consult Complete   Social Work Consult for Recovery Care Planning/Counseling Complete   Palliative Care Screening Not Applicable   Medication Review Oceanographer) Complete Complete  PCP or Specialist appointment within 3-5 days of discharge  Complete  HRI or Home Care Consult  Complete  SW Recovery Care/Counseling Consult  Complete  Palliative Care Screening  Not Applicable  Skilled Nursing Facility  Not Applicable

## 2023-03-03 NOTE — Evaluation (Signed)
Physical Therapy Evaluation and Discharge Patient Details Name: Latoya Walsh MRN: [id number] DOB: 08-26-1960 Today's Date: 03/03/2023  History of Present Illness  62 y.o. female presents 03/02/23 with double vision x 2 days. MRI brain showed focal acute infarction in the right dorsal pons. PMH significant CHF, ESRD, diabetes melitis, HLD, HTN, history of hep C, history of drug/alcohol use, tobacco abuse  Clinical Impression   Patient evaluated by Physical Therapy with no further acute PT needs identified. Patient denies vision or strength changes. She has slow, shuffling gait that she reports is her baseline.  PT is signing off. Thank you for this referral.         If plan is discharge home, recommend the following:     Can travel by private vehicle        Equipment Recommendations None recommended by PT  Recommendations for Other Services       Functional Status Assessment Patient has not had a recent decline in their functional status     Precautions / Restrictions Precautions Precautions: None      Mobility  Bed Mobility Overal bed mobility: Independent                  Transfers Overall transfer level: Independent Equipment used: None                    Ambulation/Gait Ambulation/Gait assistance: Independent Gait Distance (Feet): 200 Feet Assistive device: None Gait Pattern/deviations: Step-through pattern, Decreased stride length, Shuffle       General Gait Details: pt reports this is her normal walking  Stairs Stairs: Yes Stairs assistance: Modified independent (Device/Increase time) Stair Management: One rail Right Number of Stairs: 2    Wheelchair Mobility     Tilt Bed    Modified Rankin (Stroke Patients Only) Modified Rankin (Stroke Patients Only) Pre-Morbid Rankin Score: No symptoms Modified Rankin: No symptoms     Balance Overall balance assessment: Independent                                            Pertinent Vitals/Pain Pain Assessment Pain Assessment: No/denies pain    Home Living Family/patient expects to be discharged to:: Private residence Living Arrangements: Other relatives (brother) Available Help at Discharge: Family Type of Home: House Home Access: Stairs to enter Entrance Stairs-Rails: Right;Left;Can reach both Secretary/administrator of Steps: 2   Home Layout: One level        Prior Function Prior Level of Function : Independent/Modified Independent                     Extremity/Trunk Assessment   Upper Extremity Assessment Upper Extremity Assessment: Overall WFL for tasks assessed    Lower Extremity Assessment Lower Extremity Assessment: Overall WFL for tasks assessed    Cervical / Trunk Assessment Cervical / Trunk Assessment: Normal  Communication   Communication Communication: No apparent difficulties  Cognition Arousal: Alert Behavior During Therapy: WFL for tasks assessed/performed Overall Cognitive Status: No family/caregiver present to determine baseline cognitive functioning                                 General Comments: see SLP cognitive evaluation        General Comments      Exercises     Assessment/Plan  PT Assessment Patient does not need any further PT services  PT Problem List         PT Treatment Interventions      PT Goals (Current goals can be found in the Care Plan section)  Acute Rehab PT Goals Patient Stated Goal: none stated PT Goal Formulation: All assessment and education complete, DC therapy    Frequency       Co-evaluation               AM-PAC PT "6 Clicks" Mobility  Outcome Measure Help needed turning from your back to your side while in a flat bed without using bedrails?: None Help needed moving from lying on your back to sitting on the side of a flat bed without using bedrails?: None Help needed moving to and from a bed to a chair (including a  wheelchair)?: None Help needed standing up from a chair using your arms (e.g., wheelchair or bedside chair)?: None Help needed to walk in hospital room?: None Help needed climbing 3-5 steps with a railing? : None 6 Click Score: 24    End of Session   Activity Tolerance: Patient tolerated treatment well Patient left: in bed;with call bell/phone within reach;with bed alarm set (transport coming to take to HD) Nurse Communication: Mobility status PT Visit Diagnosis: Other abnormalities of gait and mobility (R26.89)    Time: 9528-4132 PT Time Calculation (min) (ACUTE ONLY): 12 min   Charges:   PT Evaluation $PT Eval Low Complexity: 1 Low   PT General Charges $$ ACUTE PT VISIT: 1 Visit          Jerolyn Center, PT Acute Rehabilitation Services  Office [phone]   [name] Amos 03/03/2023, 11:08 AM

## 2023-03-03 NOTE — Procedures (Signed)
I have reviewed the HD regimen and made appropriate changes.  Vinson Moselle MD  [facility] 03/03/2023, 3:43 PM

## 2023-03-03 NOTE — Progress Notes (Signed)
PROGRESS NOTE Latoya Walsh  OVF:[id number] DOB: October 05, 1960 DOA: 03/01/2023 PCP: Caesar Bookman, NP  Brief Narrative/Hospital Course: 62 y.o. female with medical history significant CHF, ESRD, diabetes melitis, HLD, HTN, history of hep C, history of drug/alcohol use, tobacco abuse presents with double vision x 2 days . In the ED, vitals stable  labs-hemoglobin of 11.3, sodium of 132.  Troponins negative. CTA head negative, MRI brain showed focal acute infarction in the dorsal pons just to the right of midline, demyelinating disease is not excluded but much less likely. CTA head and neck >>no emergent large vessel occlusion. UA negative, chest x-ray negative.  Patient admitted for further management.      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  Given recurrent nature of the symptoms CVA versus sequelae of chronic ischemia, neurology following, current stroke workup as below along with PT OT ST eval.  Antiplatelets per neuro-aspirin 81 indefinitely plus Plavix 75 x 21 days.  Currently no focal weakne PTOT Speech consult Telemetry monitoring Frequent neuro checks  HTN: BP stable allow permissive hypertension holding home losartan Toprol  ESRD on HD MWF  Metabolic bone disease: Nephro following for HD 12/11.  Monitor electrolytes Recent Labs    04/30/22 0000 05/08/22 0000 01/07/23 1439 02/11/23 0805 03/01/23 1639 03/03/23 0544  BUN 36* 50* 35* 16 19 35*  CREATININE 7.8*  --  6.04* 5.67* 4.10* 7.91*  CO2  --   --  26 28 24 22   K 4.8  --  4.5 4.1 4.1 4.7    Anemia chronic kidney disease: Monitor hemoglobin Recent Labs  Lab 03/01/23 1639 03/03/23 0544  HGB 11.3* 10.8*  HCT 33.2* 32.2*     T2DM uncontrolled A1c 9.5 HLD: Blood sugar fairly stable continue on  SSI for now, continue statin Recent Labs  Lab 03/02/23 1933 03/02/23 2119 03/03/23 0053 03/03/23 0514 03/03/23 0821  GLUCAP 141* 210* 262* 82 111*    Chronic systolic and diastolic CHF: Managed fluid with dialysis.  Eval currently  Polysubstance abuse history Tobacco abuse: Counseling provided  DVT prophylaxis: heparin injection 5,000 Units Start: 03/02/23 2245 SCD's Start: 03/02/23 2133 Code Status:   Code Status: Full Code Family Communication: plan of care discussed with patient at bedside. Patient status is: Remains hospitalized because of severity of illness Level of care: Telemetry Medical   Dispo: The patient is from: home            Anticipated disposition: TBD Objective: Vitals last 24 hrs: Vitals:   03/02/23 2053 03/02/23 2358 03/03/23 0524 03/03/23 0743  BP: (!) 153/120 (!) 123/41 121/61 (!) 109/45  Pulse: 88 77 78 76  Resp: 18  18 16   Temp: 98.9 F (37.2 C) 98.3 F (36.8 C) 98.1 F (36.7 C) 98 F (36.7 C)  TempSrc: Oral Oral Oral   SpO2: 100% 100% 100% 100%  Weight:      Height:       Weight change:   Physical Examination: General exam: alert awake,at baseline, older than stated age HEENT:Oral mucosa moist, Ear/Nose WNL grossly Respiratory system: Bilaterally clear BS,no use of accessory muscle Cardiovascular system: S1 & S2 +, No JVD. Gastrointestinal system: Abdomen soft,NT,ND, BS+ Nervous System: Alert, awake, moving all extremities,and following commands. Extremities: LE edema neg,distal peripheral pulses palpable and warm.  Skin: No rashes,no icterus. MSK:  Normal muscle bulk,tone, power   Medications reviewed:  Scheduled Meds:  aspirin EC  81 mg Oral Daily   [START ON 03/04/2023] atorvastatin  40 mg Oral Daily   busPIRone  5 mg Oral BID   Chlorhexidine Gluconate Cloth  6 each Topical Q0600   clopidogrel  75 mg Oral Daily   heparin  2,500 Units Dialysis Once in dialysis   heparin  5,000 Units Subcutaneous Q8H   insulin aspart  0-9 Units  Subcutaneous Q4H   mirtazapine  15 mg Oral QPM   Continuous Infusions:  anticoagulant sodium citrate      Diet Order             Diet renal/carb modified with fluid restriction Diet-HS Snack? Nothing; Fluid restriction: 1200 mL Fluid; Room service appropriate? Yes; Fluid consistency: Thin  Diet effective now                   Intake/Output Summary (Last 24 hours) at 03/03/2023 1126 Last data filed at 03/03/2023 0900 Gross per 24 hour  Intake 240 ml  Output 300 ml  Net -60 ml   Net IO Since Admission: -60 mL [03/03/23 1126]  Wt Readings from Last 3 Encounters:  03/02/23 73.9 kg  02/25/23 74.1 kg  02/23/23 73.7 kg     Unresulted Labs (From admission, onward)     Start     Ordered   03/03/23 0807  Hemoglobin A1c  Once,   R       Question:  Specimen collection method  Answer:  Lab=Lab collect   03/03/23 0806   03/03/23 0805  Rapid urine drug screen (hospital performed)  ONCE - STAT,   STAT        03/03/23 0804   03/03/23 0500  HIV Antibody (routine testing w rflx)  Once,   R        03/03/23 0500   03/03/23 0428  Hepatitis B surface antibody,quantitative  (New Admission Hemo Labs (Hepatitis B))  Once,   R       Question:  Specimen collection method  Answer:  Lab=Lab collect   03/03/23 0429          Data Reviewed: I have personally reviewed following labs and imaging studies CBC: Recent Labs  Lab 03/01/23 1639 03/03/23 0544  WBC 8.2 7.8  NEUTROABS 4.6 3.8  HGB 11.3* 10.8*  HCT 33.2* 32.2*  MCV 92.2 92.0  PLT 155 157   Basic Metabolic Panel:  Recent Labs  Lab 03/01/23 1639 03/03/23 0544  NA 132* 136  K 4.1 4.7  CL 95* 102  CO2 24 22  GLUCOSE 336* 84  BUN 19 35*  CREATININE 4.10* 7.91*  CALCIUM 8.4* 8.9  PHOS  --  5.8*   GFR: Estimated Creatinine Clearance: 7.4 mL/min (A) (by C-G formula based on SCr of 7.91 mg/dL (H)). Liver Function Tests:  Recent Labs  Lab 03/01/23 1639 03/03/23 0544  AST 18  --   ALT 20  --   ALKPHOS 84  --   BILITOT  0.4  --   PROT 7.4  --   ALBUMIN 3.4* 3.2*   Recent Labs  Lab 03/02/23 1933 03/02/23 2119 03/03/23 0053 03/03/23 0514 03/03/23 0821  GLUCAP 141* 210* 262* 82 111*   No results found for this or any previous visit (from the past 240 hour(s)).  Antimicrobials/Microbiology: Anti-infectives (From admission, onward)    None         Component Value Date/Time   SDES  11/03/2021 1010    IN/OUT CATH URINE Performed at [facility], [address] [place], [place] [address]    [facility]  11/03/2021 1010    NONE Performed at [facility], [address] [place], [place] [address]    CULT  11/03/2021 1010    NO GROWTH Performed at [facility], [address], [place], [place] [address]    REPTSTATUS 11/05/2021 FINAL 11/03/2021 1010     Radiology Studies: CT ANGIO HEAD NECK W WO CM  Result Date: 03/02/2023 CLINICAL DATA:  Diplopia EXAM: CT ANGIOGRAPHY HEAD AND NECK WITH AND WITHOUT CONTRAST TECHNIQUE: Multidetector CT imaging of the head and neck was performed using the standard protocol during bolus administration of intravenous contrast. Multiplanar CT image reconstructions and MIPs were obtained to evaluate the vascular anatomy. Carotid stenosis measurements (when applicable) are obtained utilizing NASCET criteria, using the distal internal carotid diameter as the denominator. RADIATION DOSE REDUCTION: This exam was performed according to the departmental dose-optimization program which includes automated exposure control, adjustment of the mA and/or kV according to patient size and/or use of iterative reconstruction technique. CONTRAST:  75mL OMNIPAQUE IOHEXOL 350 MG/ML SOLN COMPARISON:  CT head 03/01/2023. FINDINGS: CT HEAD FINDINGS Brain: No evidence of acute infarction, hemorrhage, hydrocephalus, extra-axial collection or mass lesion/mass effect. Patchy white matter hypodensities are nonspecific but compatible with chronic microvascular ischemic change. Vascular: See  below. Skull: No acute fracture. Sinuses/Orbits: Paranasal sinus mucosal thickening. No acute orbital findings. Other: No mastoid effusions Review of the MIP images confirms the above findings CTA NECK FINDINGS Aortic arch: The great vessel origins are patent without significant stenosis. Atherosclerosis. Right carotid system: Atherosclerosis at the carotid bifurcation without greater than 50% stenosis. Left carotid system: Atherosclerosis at the carotid bifurcation without greater than 50% stenosis. Vertebral arteries: The vertebral arteries are patent without greater than 50% stenosis. Skeleton: No acute abnormality on limited assessment. Multilevel degenerative change. Other neck: No acute abnormality on limited assessment. Upper chest: Visualized lung apices are clear. Review of the MIP images confirms the above findings CTA HEAD FINDINGS Anterior circulation: Bilateral intracranial ICAs are patent and tortuous. Severe right paraclinoid ICA stenosis. Bilateral MCAs and ACAs are patent without proximal hemodynamically significant stenosis. Posterior circulation: Bilateral intradural vertebral arteries, basilar artery and bilateral posterior cerebral arteries are patent. Severe left P2 PCA stenosis. Venous sinuses: As permitted by contrast timing, patent. Review of the MIP images confirms the above findings IMPRESSION: 1. No emergent large vessel occlusion. 2. 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 Electronically Signed   By: Feliberto Harts M.D.   On: 03/02/2023 15:41   MR Brain W and Wo Contrast  Result Date: 03/02/2023 CLINICAL DATA:  Neuro deficit, persistent/recurrent. CNS neoplasm suspected. EXAM: MRI HEAD WITHOUT AND WITH CONTRAST TECHNIQUE: Multiplanar, multiecho pulse sequences of the brain and surrounding structures were obtained without and with intravenous contrast. CONTRAST:  7mL GADAVIST GADOBUTROL 1 MMOL/ML IV SOLN COMPARISON:  Head CT yesterday. FINDINGS: Brain: Diffusion  imaging shows a focal acute infarction in the dorsal pons just to the right of midline. No other acute infarction. Chronic small-vessel ischemic changes are otherwise present diffusely throughout the pons and middle cerebellar peduncles. Cerebral hemispheres show confluent abnormal T2 and FLAIR signal throughout the deep and subcortical white matter. No large vessel territory stroke. No mass, hemorrhage, hydrocephalus or extra-axial collection. Small-vessel disease is favored in this patient with renal failure, diabetes and hypertension. Demyelinating disease is not excluded but much less likely. Vascular: Major  vessels at the base of the brain show flow. Skull and upper cervical spine: Negative Sinuses/Orbits: Mucosal inflammatory changes of the paranasal sinuses on the right. No advanced sinusitis. Orbits negative. Other: None IMPRESSION: 1. Focal acute infarction in the dorsal pons just to the right of midline. 2. Extensive chronic small-vessel ischemic changes throughout the pons, middle cerebellar peduncles and cerebral hemispheric white matter. Small-vessel disease is favored in this patient with renal failure, diabetes and hypertension. Demyelinating disease is not excluded but much less likely. Electronically Signed   By: Paulina Fusi M.D.   On: 03/02/2023 13:13   CT Head Wo Contrast  Result Date: 03/01/2023 CLINICAL DATA:  Neuro deficit, acute, stroke suspected EXAM: CT HEAD WITHOUT CONTRAST TECHNIQUE: Contiguous axial images were obtained from the base of the skull through the vertex without intravenous contrast. RADIATION DOSE REDUCTION: This exam was performed according to the departmental dose-optimization program which includes automated exposure control, adjustment of the mA and/or kV according to patient size and/or use of iterative reconstruction technique. COMPARISON:  None Available. FINDINGS: Brain: No evidence of acute infarction, hemorrhage, hydrocephalus, extra-axial collection or mass  lesion/mass effect. Vascular: No hyperdense vessel identified. Skull: No acute fracture. Sinuses/Orbits: Mostly clear sinuses.  No acute orbital findings. Other: No mastoid effusions. IMPRESSION: No evidence of acute intracranial abnormality. Electronically Signed   By: Feliberto Harts M.D.   On: 03/01/2023 19:47   DG Chest 1 View  Result Date: 03/01/2023 CLINICAL DATA:  Chest pain EXAM: CHEST  1 VIEW portable upright COMPARISON:  X-ray 02/24/2022 FINDINGS: Film is under penetrated. No consolidation, pneumothorax or effusion. No edema. Normal cardiopericardial silhouette. Calcified aorta. Stable right IJ double-lumen catheter with the tip overlying the right atrium. IMPRESSION: No acute cardiopulmonary disease.  Right IJ double-lumen catheter. Electronically Signed   By: Karen Kays M.D.   On: 03/01/2023 18:15     LOS: 0 days   Total time spent in review of labs and imaging, patient evaluation, formulation of plan, documentation and communication with family: 35 minutes  Lanae Boast, MD [facility]  03/03/2023, 11:26 AM

## 2023-03-03 NOTE — Progress Notes (Addendum)
STROKE TEAM PROGRESS NOTE   BRIEF HPI [name] is a 62 y.o. female with history of CHF, diabetes, hypertension, end-stage renal disease on dialysis presenting with diplopia, generalized weakness and hypertension.  He was found on MRI to have a dorsal pontine infarction on the right side.  NIH on Admission 0  INTERIM HISTORY/SUBJECTIVE Patient has been hemodynamically stable overnight, and her neurological exam is unchanged.  She states that her diplopia has completely resolved.   OBJECTIVE  CBC    Component Value Date/Time   WBC 7.8 03/03/2023 0544   RBC 3.50 (L) 03/03/2023 0544   HGB 10.8 (L) 03/03/2023 0544   HGB 10.5 (L) 01/05/2020 1132   HCT 32.2 (L) 03/03/2023 0544   HCT 31.9 (L) 01/05/2020 1132   PLT 157 03/03/2023 0544   PLT 191 01/05/2020 1132   MCV 92.0 03/03/2023 0544   MCV 90 01/05/2020 1132   MCH 30.9 03/03/2023 0544   MCHC 33.5 03/03/2023 0544   RDW 13.6 03/03/2023 0544   RDW 12.7 01/05/2020 1132   LYMPHSABS 3.1 03/03/2023 0544   MONOABS 0.5 03/03/2023 0544   EOSABS 0.3 03/03/2023 0544   BASOSABS 0.1 03/03/2023 0544    BMET    Component Value Date/Time   NA 136 03/03/2023 0544   NA 135 (A) 04/30/2022 0000   K 4.7 03/03/2023 0544   CL 102 03/03/2023 0544   CO2 22 03/03/2023 0544   GLUCOSE 84 03/03/2023 0544   BUN 35 (H) 03/03/2023 0544   BUN 50 (A) 05/08/2022 0000   CREATININE 7.91 (H) 03/03/2023 0544   CREATININE 5.67 (H) 02/11/2023 0805   CALCIUM 8.9 03/03/2023 0544   EGFR 8 (L) 02/11/2023 0805   GFRNONAA 5 (L) 03/03/2023 0544   GFRNONAA 39 (L) 03/28/2019 1056    IMAGING past 24 hours ECHOCARDIOGRAM COMPLETE  Result Date: 03/03/2023    ECHOCARDIOGRAM REPORT   Patient Name:   Latoya Walsh Date of Exam: 03/03/2023 Medical Rec #:  [id number]        Height:       67.0 in Accession #:    [id number]       Weight:       163.0 lb Date of Birth:  Apr 16, 1960       BSA:          1.854 m Patient Age:    61 years         BP:           121/61  mmHg Patient Gender: F                HR:           73 bpm. Exam Location:  Inpatient Procedure: 2D Echo, Cardiac Doppler and Color Doppler Indications:    Stroke  History:        Patient has prior history of Echocardiogram examinations, most                 recent 07/29/2021. Risk Factors:Diabetes and Former Smoker.  Sonographer:    Karma Ganja Referring Phys: [id number] [facility]  Sonographer Comments: Image acquisition challenging due to patient behavioral factors. IMPRESSIONS  1. Left ventricular ejection fraction, by estimation, is 55 to 60%. The left ventricle has normal function. The left ventricle has no regional wall motion abnormalities. There is mild left ventricular hypertrophy. Left ventricular diastolic parameters are indeterminate.  2. Right ventricular systolic function is normal. The right ventricular size is normal.  3. A  small pericardial effusion is present. There is no evidence of cardiac tamponade.  4. The mitral valve is grossly normal. Trivial mitral valve regurgitation. No evidence of mitral stenosis.  5. The aortic valve is tricuspid. There is mild calcification of the aortic valve. Aortic valve regurgitation is not visualized. Aortic valve sclerosis is present, with no evidence of aortic valve stenosis. Comparison(s): No significant change from prior study. Conclusion(s)/Recommendation(s): No intracardiac source of embolism detected on this transthoracic study. Consider a transesophageal echocardiogram to exclude cardiac source of embolism if clinically indicated. FINDINGS  Left Ventricle: Left ventricular ejection fraction, by estimation, is 55 to 60%. The left ventricle has normal function. The left ventricle has no regional wall motion abnormalities. The left ventricular internal cavity size was normal in size. There is  mild left ventricular hypertrophy. Left ventricular diastolic parameters are indeterminate. Right Ventricle: The right ventricular size is normal. No increase in  right ventricular wall thickness. Right ventricular systolic function is normal. Left Atrium: Left atrial size was normal in size. Right Atrium: Right atrial size was normal in size. Pericardium: A small pericardial effusion is present. There is no evidence of cardiac tamponade. Mitral Valve: The mitral valve is grossly normal. Trivial mitral valve regurgitation. No evidence of mitral valve stenosis. Tricuspid Valve: The tricuspid valve is grossly normal. Tricuspid valve regurgitation is mild . No evidence of tricuspid stenosis. Aortic Valve: The aortic valve is tricuspid. There is mild calcification of the aortic valve. Aortic valve regurgitation is not visualized. Aortic valve sclerosis is present, with no evidence of aortic valve stenosis. Aortic valve mean gradient measures 3.0 mmHg. Aortic valve peak gradient measures 5.1 mmHg. Aortic valve area, by VTI measures 1.84 cm. Pulmonic Valve: The pulmonic valve was not well visualized. Pulmonic valve regurgitation is not visualized. No evidence of pulmonic stenosis. Aorta: The aortic root is normal in size and structure and the ascending aorta was not well visualized. Venous: The inferior vena cava was not well visualized. IAS/Shunts: The interatrial septum was not well visualized.  LEFT VENTRICLE PLAX 2D LVIDd:         4.10 cm   Diastology LVIDs:         2.90 cm   LV e' medial:    4.35 cm/s LV PW:         1.20 cm   LV E/e' medial:  17.6 LV IVS:        1.30 cm   LV e' lateral:   8.59 cm/s LVOT diam:     2.00 cm   LV E/e' lateral: 8.9 LV SV:         45 LV SV Index:   24 LVOT Area:     3.14 cm  RIGHT VENTRICLE            IVC RV Basal diam:  3.40 cm    IVC diam: 1.40 cm RV S prime:     9.03 cm/s TAPSE (M-mode): 2.2 cm LEFT ATRIUM             Index        RIGHT ATRIUM           Index LA diam:        3.20 cm 1.73 cm/m   RA Area:     11.60 cm LA Vol (A2C):   46.4 ml 25.03 ml/m  RA Volume:   24.50 ml  13.22 ml/m LA Vol (A4C):   24.2 ml 13.05 ml/m LA Biplane Vol: 33.2  ml 17.91 ml/m  AORTIC VALVE AV Area (Vmax):    1.94 cm AV Area (Vmean):   1.76 cm AV Area (VTI):     1.84 cm AV Vmax:           113.00 cm/s AV Vmean:          78.700 cm/s AV VTI:            0.243 m AV Peak Grad:      5.1 mmHg AV Mean Grad:      3.0 mmHg LVOT Vmax:         69.80 cm/s LVOT Vmean:        44.100 cm/s LVOT VTI:          0.142 m LVOT/AV VTI ratio: 0.58  AORTA Ao Root diam: 3.00 cm MITRAL VALVE               TRICUSPID VALVE MV Area (PHT): 4.21 cm    TR Peak grad:   25.2 mmHg MV Decel Time: 180 msec    TR Vmax:        251.00 cm/s MV E velocity: 76.70 cm/s MV A velocity: 90.00 cm/s  SHUNTS MV E/A ratio:  0.85        Systemic VTI:  0.14 m                            Systemic Diam: 2.00 cm Jodelle Red MD Electronically signed by Jodelle Red MD Signature Date/Time: 03/03/2023/12:22:09 PM    Final     Vitals:   03/03/23 1430 03/03/23 1500 03/03/23 1530 03/03/23 1600  BP: (!) 132/95 106/82 104/75 98/83  Pulse: 92  (!) 107 (!) 105  Resp: 11 13 13 12   Temp:      TempSrc:      SpO2: 100% 100% 100% 100%  Weight:      Height:         PHYSICAL EXAM General:  Alert, well-nourished, well-developed patient in no acute distress Psych:  Mood and affect appropriate for situation CV: Regular rate and rhythm on monitor Respiratory:  Regular, unlabored respirations on room air GI: Abdomen soft and nontender   NEURO:  Mental Status: AA&Ox3, patient is able to give clear and coherent history Speech/Language: speech is without dysarthria or aphasia.    Cranial Nerves:  II: PERRL.  III, IV, VI: EOMI. Eyelids elevate symmetrically.  V: Sensation is intact to light touch and symmetrical to face.  VII: Face is symmetrical resting and smiling VIII: hearing intact to voice. IX, X: PPhonation is normal.  ZO:[id number] shrug 5/5. XII: tongue is midline without fasciculations. Motor: 5/5 strength to all muscle groups tested.  Tone: is normal and bulk is normal Sensation- Intact to  light touch bilaterally.  Coordination: FTN intact bilaterally, HKS: no ataxia in BLE.  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  CTA head & neck no LVO, severe right paraclinoid ICA stenosis and severe left PCA P2 stenosis MRI acute ischemic infarct in the dorsal right pons 2D Echo EF 55 to 60%  LDL 54 HgbA1c 9.5 UDS neg VTE prophylaxis -subcutaneous heparin No antithrombotic prior to admission, now on aspirin 81 mg daily and clopidogrel 75 mg daily for 3 weeks and then aspirin alone. Therapy recommendations: none Disposition: Pending  Hypertension Home meds: Amlodipine 10 mg daily, losartan 50 mg daily, metoprolol XL  50 mg daily Stable Long term BP goal normotensive  Hyperlipidemia Home meds: crestor 10 LDL 54, goal < 70 Now on lipitor 40 Continue statin on discharge  Diabetes type II Uncontrolled Home meds: Insulin glargine 46 units nightly, insulin lispro 6 units twice daily HgbA1c 9.5, goal < 7.0 CBGs SSI Recommend close follow-up with PCP for better DM control  Tobacco Abuse Patient smokes 0.25 packs per day  Nicotine replacement therapy provided  End-stage renal disease on intermittent dialysis Continue IHD per Monday Wednesday Friday schedule Renally dose medications as appropriate Avoid contrast when possible  Other Stroke Risk Factors Congestive heart failure  Other Active Problems   Hospital day # 0  Patient seen by NP and then by MD, MD to edit note as needed. Cortney E Ernestina Columbia , MSN, AGACNP-BC [facility] See Amion for schedule and pager information 03/03/2023 4:28 PM  ATTENDING NOTE: I reviewed above note and agree with the assessment and plan. Pt was seen and examined.   Patient seen at dialysis unit.  No family at bedside.  Patient stated she had diplopia for 3 days and now resolved.  Denies any  other neurodeficit at this moment.  She felt she is back to baseline.  Neuroexam no focal deficit.  Discussed again with aggressive risk factor modification and medication compliance.  Now on DAPT for 3 weeks and then aspirin alone.  Continue statin.  PT and OT no recommendation.  For detailed assessment and plan, please refer to above/below as I have made changes wherever appropriate.   Neurology will sign off. Please call with questions. Pt will follow up with stroke clinic NP at [facility] in about 4 weeks. Thanks for the consult.   Marvel Plan, MD PhD Stroke Neurology 03/03/2023 5:04 PM      To contact Stroke Continuity provider, please refer to [email or web address]. After hours, contact General Neurology

## 2023-03-03 NOTE — Progress Notes (Signed)
Pt receives out-pt HD at [facility] on MWF. Will assist as needed.   Olivia Canter Renal Navigator [phone]

## 2023-03-03 NOTE — Progress Notes (Signed)
Received patient in bed to unit.  Alert and oriented.  Informed consent signed and in chart.   TX duration:3  Patient tolerated well.  Transported back to the room  Alert, without acute distress.  Hand-off given to patient's nurse.   Access used: right HD catheter Access issues: none  Total UF removed: 1.7L Medication(s) given: none   03/03/23 1652  Vitals  Temp 97.7 F (36.5 C)  Temp Source Oral  BP (!) 109/35  MAP (mmHg) (!) 43  BP Location Right Leg  BP Method Automatic  Patient Position (if appropriate) Lying  Pulse Rate (!) 104  Pulse Rate Source Monitor  ECG Heart Rate (!) 105  Resp 15  Oxygen Therapy  SpO2 99 %  O2 Device Room Air  During Treatment Monitoring  Blood Flow Rate (mL/min) 399 mL/min  Arterial Pressure (mmHg) -213.93 mmHg  Venous Pressure (mmHg) 216.55 mmHg  TMP (mmHg) -6.66 mmHg  Ultrafiltration Rate (mL/min) 0 mL/min  Dialysate Flow Rate (mL/min) 300 ml/min  Dialysate Potassium Concentration 2  Dialysate Calcium Concentration 2.5  Duration of HD Treatment -hour(s) 2.97 hour(s)  Cumulative Fluid Removed (mL) per Treatment  1720.08  HD Safety Checks Performed Yes  Intra-Hemodialysis Comments Tx completed  Dialysis Fluid Bolus Normal Saline  Bolus Amount (mL) 300 mL      Edna Rede S [facility]

## 2023-03-03 NOTE — Evaluation (Signed)
Speech Language Pathology Evaluation Patient Details Name: Latoya Walsh MRN: [id number] DOB: January 02, 1961 Today's Date: 03/03/2023 Time: 8295-6213 SLP Time Calculation (min) (ACUTE ONLY): 10 min  Problem List:  Patient Active Problem List   Diagnosis Date Noted   Acute CVA (cerebrovascular accident) (HCC) 03/02/2023   Secondary hyperparathyroidism (HCC) 12/31/2022   ESRD on dialysis (HCC) 01/06/2021   CHF (congestive heart failure) (HCC) 06/06/2020   Leg swelling 04/28/2020   Hyperlipidemia LDL goal <100 04/28/2020   Left hand pain 03/10/2020   Anemia 02/19/2020   COVID-19 virus infection 12/26/2019   Depression, major, single episode, in partial remission (HCC) 01/25/2019   Headache 11/13/2018   Insomnia 09/25/2018   Screening for colorectal cancer 07/23/2016   Lumbar back pain with radiculopathy affecting right lower extremity 03/27/2015   Non compliance with medical treatment 07/22/2014   Chronic hepatitis C without hepatic coma (HCC) 12/04/2013   Allergic rhinitis 11/03/2012   Anxiety and depression 02/05/2011   Nicotine dependence 11/01/2010   Dermatitis 10/30/2010   Drug dependence, continuous abuse (HCC) 03/22/2010   Uncontrolled type 2 diabetes mellitus with hyperglycemia (HCC) 06/02/2007   Alcohol abuse 06/02/2007   Past Medical History:  Past Medical History:  Diagnosis Date   Allergic rhinitis    Anxiety    CHF (congestive heart failure) (HCC)    a. EF 30-35% by echo in 05/2020 b. EF at 45% in 09/2020   Chronic back pain    Chronic hepatitis C without hepatic coma (HCC)    COVID-19 virus infection 12/26/2019   Depression    Diabetes mellitus    Hepatitis C    Hypertension    Noncompliance    Poor appetite 07/17/2014   Substance abuse (HCC)    HX of drug use and alcohol use   Past Surgical History:  Past Surgical History:  Procedure Laterality Date   APPENDECTOMY  2004   AV FISTULA PLACEMENT Left 11/26/2020   Procedure: LEFT ARM ARTERIOVENOUS (AV)  FISTULA CREATION;  Surgeon: Larina Earthly, MD;  Location: [facility];  Service: Vascular;  Laterality: Left;   AV FISTULA PLACEMENT Left 06/03/2021   Procedure: INSERTION OF LEFT UPPER ARM ARTERIOVENOUS (AV) GORE-TEX GRAFT;  Surgeon: Larina Earthly, MD;  Location: [facility];  Service: Vascular;  Laterality: Left;   EXCHANGE OF A DIALYSIS CATHETER Right 02/24/2022   Procedure: EXCHANGE OF A DIALYSIS CATHETER;  Surgeon: Larina Earthly, MD;  Location: [facility];  Service: Vascular;  Laterality: Right;   IR FLUORO GUIDE CV LINE RIGHT  12/25/2020   IR US GUIDE VASC ACCESS RIGHT  12/25/2020   LIGATION ARTERIOVENOUS GORTEX GRAFT Left 08/05/2021   Procedure: LIGATION OF LEFT ARM ARTERIOVENOUS GORTEX GRAFT;  Surgeon: Larina Earthly, MD;  Location: [facility];  Service: Vascular;  Laterality: Left;   HPI:  62 y.o. female presents 03/02/23 with double vision x 2 days. MRI brain showed focal acute infarction in the right dorsal pons. PMH significant CHF, ESRD, diabetes melitis, HLD, HTN, history of hep C, history of drug/alcohol use, tobacco abuse   Assessment / Plan / Recommendation Clinical Impression  Pt seen for cognitive-linguistic evaluation. Evaluation completed via informal means. Per chart review, pt with PMHx "cognitive/memory" deficits. No family present to provide additional details. Pt presents with s/sx cognitive-linguistic deficits affecting temporal orientation, attention, memory, problem solving, executive functioning, and insight. Despite cueing pt stated year is 2004/2005 and that she would call "9" for emergency services.  Pt with some verbal perseveration without apparent awareness. Pt  with impaired auditory comprehension for complex information. ?pre-morbid speech difference given gliding appreciated with post-vocalic /r/ intermittently. Pragmatic language impaired given pt's flat affect and intermittent lability/tearfulness. Pt declined to participate in more formal cognitive-linguistic evaluation at this time.  SLP to continue to f/u. Anticipate benefit of post-acute SLP services, if pt not a cognitive-linguistic baseline.    SLP Assessment  SLP Recommendation/Assessment: Patient needs continued Speech Lanaguage Pathology Services SLP Visit Diagnosis: Cognitive communication deficit (R41.841)    Recommendations for follow up therapy are one component of a multi-disciplinary discharge planning process, led by the attending physician.  Recommendations may be updated based on patient status, additional functional criteria and insurance authorization.    Follow Up Recommendations   (in alignment with OT/PT recommendations)    Assistance Recommended at Discharge  Intermittent Supervision/Assistance  Functional Status Assessment Patient has had a recent decline in their functional status and demonstrates the ability to make significant improvements in function in a reasonable and predictable amount of time.  Frequency and Duration min 2x/week  2 weeks      SLP Evaluation Cognition  Overall Cognitive Status: No family/caregiver present to determine baseline cognitive functioning Arousal/Alertness: Awake/alert Orientation Level: Oriented to person;Oriented to place;Disoriented to time Year:  (2004, 2005) Day of Week: Incorrect Attention: Sustained Sustained Attention: Impaired Sustained Attention Impairment: Verbal basic Memory: Impaired Memory Impairment: Storage deficit;Retrieval deficit;Decreased recall of new information Awareness: Impaired Awareness Impairment: Intellectual impairment;Emergent impairment Problem Solving: Impaired Problem Solving Impairment: Verbal basic Behaviors: Perseveration Safety/Judgment: Impaired       Comprehension  Auditory Comprehension Overall Auditory Comprehension: Impaired Yes/No Questions: Impaired Complex Questions: 25-49% accurate Commands: Impaired Complex Commands: 25-49% accurate Other Conversation Comments: repetition needed during  conversation Interfering Components: Attention EffectiveTechniques: Visual/Gestural cues;Extra processing time    Expression Expression Primary Mode of Expression: Verbal Verbal Expression Overall Verbal Expression: Impaired Initiation: No impairment Automatic Speech: Name;Social Response Repetition: No impairment Naming: No impairment (confrontation naming) Pragmatics: Impairment Impairments: Abnormal affect (tearfulness) Interfering Components: Attention   Oral / Motor  Oral Motor/Sensory Function Overall Oral Motor/Sensory Function: Within functional limits Motor Speech Overall Motor Speech: Appears within functional limits for tasks assessed Respiration: Within functional limits Phonation: Normal Resonance: Within functional limits Articulation: Within functional limitis (?premorbid speech difference; gliding /r/ appreciated) Intelligibility: Intelligible Motor Planning: Witnin functional limits           Clyde Canterbury, M.S., CCC-SLP Speech-Language Pathologist Secure Chat Preferred  O: [phone]  Woodroe Chen 03/03/2023, 10:22 AM

## 2023-03-03 NOTE — Progress Notes (Signed)
OT Cancellation Note  Patient Details Name: Latoya Walsh MRN: [id number] DOB: Aug 18, 1960   Cancelled Treatment:    Reason Eval/Treat Not Completed: Patient at procedure or test/ unavailable (off unit for HD)  Carver Fila, OTD, OTR/L SecureChat Preferred Acute Rehab [phone]    Dalphine Handing 03/03/2023, 1:53 PM

## 2023-03-03 NOTE — Hospital Course (Addendum)
62 y.o. female with medical history significant CHF, ESRD, diabetes melitis, HLD, HTN, history of hep C, history of drug/alcohol use, tobacco abuse presents with double vision x 2 days . In the ED, vitals stable  labs-hemoglobin of 11.3, sodium of 132.  Troponins negative. CTA head negative, MRI brain showed focal acute infarction in the dorsal pons just to the right of midline, demyelinating disease is not excluded but much less likely. CTA head and neck >>no emergent large vessel occlusion. UA negative, chest x-ray negative.  Patient admitted for further management. Tte- lvef 55-60%, RVSF normal. PTOT advised NO pt follow up.

## 2023-03-04 ENCOUNTER — Other Ambulatory Visit (HOSPITAL_COMMUNITY): Payer: Self-pay

## 2023-03-04 ENCOUNTER — Ambulatory Visit: Payer: Medicaid Other | Admitting: "Endocrinology

## 2023-03-04 DIAGNOSIS — I639 Cerebral infarction, unspecified: Secondary | ICD-10-CM | POA: Diagnosis not present

## 2023-03-04 LAB — GLUCOSE, CAPILLARY
Glucose-Capillary: 199 mg/dL — ABNORMAL HIGH (ref 70–99)
Glucose-Capillary: 204 mg/dL — ABNORMAL HIGH (ref 70–99)
Glucose-Capillary: 226 mg/dL — ABNORMAL HIGH (ref 70–99)

## 2023-03-04 MED ORDER — TRAMADOL HCL 50 MG PO TABS
50.0000 mg | ORAL_TABLET | Freq: Once | ORAL | Status: AC
  Administered 2023-03-04: 50 mg via ORAL
  Filled 2023-03-04: qty 1

## 2023-03-04 MED ORDER — ASPIRIN 81 MG PO TBEC
81.0000 mg | DELAYED_RELEASE_TABLET | Freq: Every day | ORAL | 12 refills | Status: DC
  Filled 2023-03-04: qty 30, 30d supply, fill #0

## 2023-03-04 MED ORDER — CLOPIDOGREL BISULFATE 75 MG PO TABS
75.0000 mg | ORAL_TABLET | Freq: Every day | ORAL | 0 refills | Status: AC
  Filled 2023-03-04: qty 21, 21d supply, fill #0

## 2023-03-04 MED ORDER — ATORVASTATIN CALCIUM 40 MG PO TABS
40.0000 mg | ORAL_TABLET | Freq: Every day | ORAL | 0 refills | Status: DC
  Filled 2023-03-04: qty 30, 30d supply, fill #0

## 2023-03-04 NOTE — Discharge Summary (Signed)
Physician Discharge Summary  ZIYANA SASS MWN:[id number] DOB: 02-05-1961 DOA: 03/01/2023  PCP: Caesar Bookman, NP  Admit date: 03/01/2023 Discharge date: 03/04/2023 Recommendations for Outpatient Follow-up:  Follow up with PCP in 1 weeks-call for appointment Please obtain BMP/CBC in one week  Discharge Dispo: Home Discharge Condition: Stable Code Status:   Code Status: Full Code Diet recommendation:  Diet Order             Diet renal/carb modified with fluid restriction Diet-HS Snack? Nothing; Fluid restriction: 1200 mL Fluid; Room service appropriate? Yes; Fluid consistency: Thin  Diet effective now                    Brief/Interim Summary: 62 y.o. female with medical history significant CHF, ESRD, diabetes melitis, HLD, HTN, history of hep C, history of drug/alcohol use, tobacco abuse presents with double vision x 2 days . In the ED, vitals stable  labs-hemoglobin of 11.3, sodium of 132.  Troponins negative. CTA head negative, MRI brain showed focal acute infarction in the dorsal pons just to the right of midline, demyelinating disease is not excluded but much less likely. CTA head and neck >>no emergent large vessel occlusion. UA negative, chest x-ray negative.  Patient admitted for further management. Tte- lvef 55-60%, RVSF normal.  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 PTOT advised NO pt follow up.    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 PTOT advised NO pt follow up.  HTN: May reusme home meds at home- but bp stable wo home meds-she needs to discuss with her PCP   ESRD on HD MWF  Metabolic bone disease: Nephro following s/pHD 12/11 doing well.  Anemia chronic kidney disease: Stable hemoglobin    T2DM uncontrolled A1c 9.5 HLD: Resume her home regimen of insulin and follow-up with PCP statin/Lipitor.    Chronic systolic and diastolic CHF: Managed fluid with dialysis.  Eval currently   Polysubstance abuse history Tobacco abuse: Counseling provided  Consults: Neurology, nephrology Subjective: Alert awake no complaints  Discharge Exam: Vitals:   03/04/23 0516 03/04/23 0736  BP: 110/77 102/66  Pulse: (!) 108 86  Resp: 18 17  Temp: 98.4 F (36.9 C) 97.7 F (36.5 C)  SpO2: 99% 99%   General: Pt is alert, awake, not in acute distress Cardiovascular: RRR, S1/S2 +, no rubs, no gallops Respiratory: CTA bilaterally, no wheezing, no rhonchi Abdominal: Soft, NT, ND, bowel sounds + Extremities: no edema, no cyanosis  Discharge Instructions  Discharge Instructions     Ambulatory referral to Neurology   Complete by: As directed    Follow up with stroke clinic NP (Jessica Vanschaick or Darrol Angel, if both not available, consider Manson Allan, or Ahern) at [facility] in about 4 weeks. Thanks.   Discharge instructions   Complete by: As directed    Please call call MD or  return to ER for similar or worsening recurring problem that brought you to hospital or if any fever,nausea/vomiting,abdominal pain, uncontrolled pain, chest pain,  shortness of breath or any other alarming symptoms.  Please follow-up your doctor as instructed in a week time and call the office for appointment.  Please avoid alcohol, smoking, or any other illicit substance and maintain healthy habits including taking your regular medications as prescribed.  You were  cared for by a hospitalist during your hospital stay. If you have any questions about your discharge medications or the care you received while you were in the hospital after you are discharged, you can call the unit and ask to speak with the hospitalist on call if the hospitalist that took care of you is not available.  Once you are discharged, your primary care physician will handle any further medical issues. Please note that NO REFILLS for any discharge medications will be authorized once you are discharged, as it is imperative that you return to your primary care physician (or establish a relationship with a primary care physician if you do not have one) for your aftercare needs so that they can reassess your need for medications and monitor your lab values   Increase activity slowly   Complete by: As directed       Allergies as of 03/04/2023       Reactions   Penicillins Itching, Rash        Medication List     STOP taking these medications    amLODipine 10 MG tablet Commonly known as: NORVASC   losartan 50 MG tablet Commonly known as: COZAAR   rosuvastatin 10 MG tablet Commonly known as: CRESTOR       TAKE these medications    albuterol 108 (90 Base) MCG/ACT inhaler Commonly known as: VENTOLIN HFA Inhale 2 puffs into the lungs every 4 (four) hours as needed for wheezing or shortness of breath.   aspirin EC 81 MG tablet Take 1 tablet (81 mg total) by mouth daily. Swallow whole. Start taking on: March 05, 2023   atorvastatin 40 MG tablet Commonly known as: LIPITOR Take 1 tablet (40 mg total) by mouth daily. Start taking on: March 05, 2023   busPIRone 5 MG tablet Commonly known as: BUSPAR Take 1 tablet (5 mg total) by mouth 2 (two) times daily.   clopidogrel 75 MG tablet Commonly known as: PLAVIX Take 1 tablet (75 mg total) by mouth daily for 21 days. Start taking on: March 05, 2023   D3-1000 25 MCG (1000 UT) capsule Generic drug:  Cholecalciferol Take 1,000 Units by mouth daily.   docusate sodium 100 MG capsule Commonly known as: Colace Take 1 capsule (100 mg total) by mouth daily as needed for mild constipation.   FreeStyle Libre 3 Sensor Misc 1 Device by Does not apply route continuous. Place 1 sensor on the skin every 14 days. Use to check glucose continuously   insulin lispro 100 UNIT/ML KwikPen Commonly known as: HumaLOG KwikPen Inject 6 Units into the skin 2 (two) times daily. What changed:  how much to take when to take this   Iron (Ferrous Sulfate) 325 (65 Fe) MG Tabs Take 325 mg by mouth daily.   Lantus SoloStar 100 UNIT/ML Solostar Pen Generic drug: insulin glargine Inject 38 Units into the skin at bedtime. What changed:  how much to take when to take this   metoprolol succinate 50 MG 24 hr tablet Commonly known as: TOPROL-XL TAKE ONE TABLET BY MOUTH EVERY  EVENING   mirtazapine 15 MG tablet Commonly known as: REMERON TAKE ONE TABLET BY MOUTH EVERY EVENING   OneTouch Verio Flex System w/Device Kit USE TO TEST BLOOD SUGAR TWICE DAILY AS DIRECTED *REFILL REQUEST*   Pen Needles 31G X 5 MM Misc Use to inject insulin once daily   Comfort EZ Pen Needles 32G X 4 MM Misc Generic drug: Insulin Pen Needle USE AS DIRECTED with insulin injections   sevelamer carbonate 800 MG tablet Commonly known as: RENVELA Take 800 mg by mouth 3 (three) times daily with meals.   RENVELA PO Take 1 packet by mouth See admin instructions. 1 packet with each meal and with snacks   Velphoro 500 MG chewable tablet Generic drug: sucroferric oxyhydroxide Chew 500 mg by mouth 3 (three) times daily with meals.        Follow-up Information     [facility]. Schedule an appointment as soon as possible for a visit in 1 month(s).   Specialty: Neurology Why: stroke clinic Contact information: [address] [place] [address] [phone]         [name], [name], NP Follow up in 1 week(s).   Specialty: Family Medicine Contact information: [address] [place] [address] [phone]                Allergies  Allergen Reactions   Penicillins Itching and Rash    The results of significant diagnostics from this hospitalization (including imaging, microbiology, ancillary and laboratory) are listed below for reference.    Microbiology: No results found for this or any previous visit (from the past 240 hours).  Procedures/Studies: ECHOCARDIOGRAM COMPLETE Result Date: 03/03/2023    ECHOCARDIOGRAM REPORT   Patient Name:   Latoya Walsh Date of Exam: 03/03/2023 Medical Rec #:  [id number]        Height:       67.0 in Accession #:    [id number]       Weight:       163.0 lb Date of Birth:  09-06-1960       BSA:          1.854 m Patient Age:    61 years         BP:           121/61 mmHg Patient Gender: F                HR:           73 bpm. Exam Location:  Inpatient Procedure: 2D Echo, Cardiac Doppler and Color Doppler Indications:    Stroke  History:        Patient has prior history of Echocardiogram examinations, most                 recent 07/29/2021. Risk Factors:Diabetes and Former Smoker.  Sonographer:    Karma Ganja Referring Phys: [id number] [facility]  Sonographer Comments: Image acquisition challenging due to patient behavioral factors. IMPRESSIONS  1. Left ventricular ejection fraction, by estimation, is 55 to 60%. The left ventricle has normal function. The left ventricle has no regional wall motion abnormalities. There is mild left ventricular hypertrophy. Left ventricular diastolic parameters are indeterminate.  2. Right ventricular systolic function is normal. The right ventricular size is normal.  3. A small pericardial effusion is present. There is no evidence of cardiac tamponade.  4. The mitral valve is grossly normal. Trivial mitral valve regurgitation. No evidence of mitral  stenosis.  5. The aortic valve is  tricuspid. There is mild calcification of the aortic valve. Aortic valve regurgitation is not visualized. Aortic valve sclerosis is present, with no evidence of aortic valve stenosis. Comparison(s): No significant change from prior study. Conclusion(s)/Recommendation(s): No intracardiac source of embolism detected on this transthoracic study. Consider a transesophageal echocardiogram to exclude cardiac source of embolism if clinically indicated. FINDINGS  Left Ventricle: Left ventricular ejection fraction, by estimation, is 55 to 60%. The left ventricle has normal function. The left ventricle has no regional wall motion abnormalities. The left ventricular internal cavity size was normal in size. There is  mild left ventricular hypertrophy. Left ventricular diastolic parameters are indeterminate. Right Ventricle: The right ventricular size is normal. No increase in right ventricular wall thickness. Right ventricular systolic function is normal. Left Atrium: Left atrial size was normal in size. Right Atrium: Right atrial size was normal in size. Pericardium: A small pericardial effusion is present. There is no evidence of cardiac tamponade. Mitral Valve: The mitral valve is grossly normal. Trivial mitral valve regurgitation. No evidence of mitral valve stenosis. Tricuspid Valve: The tricuspid valve is grossly normal. Tricuspid valve regurgitation is mild . No evidence of tricuspid stenosis. Aortic Valve: The aortic valve is tricuspid. There is mild calcification of the aortic valve. Aortic valve regurgitation is not visualized. Aortic valve sclerosis is present, with no evidence of aortic valve stenosis. Aortic valve mean gradient measures 3.0 mmHg. Aortic valve peak gradient measures 5.1 mmHg. Aortic valve area, by VTI measures 1.84 cm. Pulmonic Valve: The pulmonic valve was not well visualized. Pulmonic valve regurgitation is not visualized. No evidence of pulmonic stenosis. Aorta: The aortic root is normal in size  and structure and the ascending aorta was not well visualized. Venous: The inferior vena cava was not well visualized. IAS/Shunts: The interatrial septum was not well visualized.  LEFT VENTRICLE PLAX 2D LVIDd:         4.10 cm   Diastology LVIDs:         2.90 cm   LV e' medial:    4.35 cm/s LV PW:         1.20 cm   LV E/e' medial:  17.6 LV IVS:        1.30 cm   LV e' lateral:   8.59 cm/s LVOT diam:     2.00 cm   LV E/e' lateral: 8.9 LV SV:         45 LV SV Index:   24 LVOT Area:     3.14 cm  RIGHT VENTRICLE            IVC RV Basal diam:  3.40 cm    IVC diam: 1.40 cm RV S prime:     9.03 cm/s TAPSE (M-mode): 2.2 cm LEFT ATRIUM             Index        RIGHT ATRIUM           Index LA diam:        3.20 cm 1.73 cm/m   RA Area:     11.60 cm LA Vol (A2C):   46.4 ml 25.03 ml/m  RA Volume:   24.50 ml  13.22 ml/m LA Vol (A4C):   24.2 ml 13.05 ml/m LA Biplane Vol: 33.2 ml 17.91 ml/m  AORTIC VALVE AV Area (Vmax):    1.94 cm AV Area (Vmean):   1.76 cm AV Area (VTI):     1.84 cm AV Vmax:  113.00 cm/s AV Vmean:          78.700 cm/s AV VTI:            0.243 m AV Peak Grad:      5.1 mmHg AV Mean Grad:      3.0 mmHg LVOT Vmax:         69.80 cm/s LVOT Vmean:        44.100 cm/s LVOT VTI:          0.142 m LVOT/AV VTI ratio: 0.58  AORTA Ao Root diam: 3.00 cm MITRAL VALVE               TRICUSPID VALVE MV Area (PHT): 4.21 cm    TR Peak grad:   25.2 mmHg MV Decel Time: 180 msec    TR Vmax:        251.00 cm/s MV E velocity: 76.70 cm/s MV A velocity: 90.00 cm/s  SHUNTS MV E/A ratio:  0.85        Systemic VTI:  0.14 m                            Systemic Diam: 2.00 cm Jodelle Red MD Electronically signed by Jodelle Red MD Signature Date/Time: 03/03/2023/12:22:09 PM    Final    CT ANGIO HEAD NECK W WO CM Result Date: 03/02/2023 CLINICAL DATA:  Diplopia EXAM: CT ANGIOGRAPHY HEAD AND NECK WITH AND WITHOUT CONTRAST TECHNIQUE: Multidetector CT imaging of the head and neck was performed using the standard  protocol during bolus administration of intravenous contrast. Multiplanar CT image reconstructions and MIPs were obtained to evaluate the vascular anatomy. Carotid stenosis measurements (when applicable) are obtained utilizing NASCET criteria, using the distal internal carotid diameter as the denominator. RADIATION DOSE REDUCTION: This exam was performed according to the departmental dose-optimization program which includes automated exposure control, adjustment of the mA and/or kV according to patient size and/or use of iterative reconstruction technique. CONTRAST:  75mL OMNIPAQUE IOHEXOL 350 MG/ML SOLN COMPARISON:  CT head 03/01/2023. FINDINGS: CT HEAD FINDINGS Brain: No evidence of acute infarction, hemorrhage, hydrocephalus, extra-axial collection or mass lesion/mass effect. Patchy white matter hypodensities are nonspecific but compatible with chronic microvascular ischemic change. Vascular: See below. Skull: No acute fracture. Sinuses/Orbits: Paranasal sinus mucosal thickening. No acute orbital findings. Other: No mastoid effusions Review of the MIP images confirms the above findings CTA NECK FINDINGS Aortic arch: The great vessel origins are patent without significant stenosis. Atherosclerosis. Right carotid system: Atherosclerosis at the carotid bifurcation without greater than 50% stenosis. Left carotid system: Atherosclerosis at the carotid bifurcation without greater than 50% stenosis. Vertebral arteries: The vertebral arteries are patent without greater than 50% stenosis. Skeleton: No acute abnormality on limited assessment. Multilevel degenerative change. Other neck: No acute abnormality on limited assessment. Upper chest: Visualized lung apices are clear. Review of the MIP images confirms the above findings CTA HEAD FINDINGS Anterior circulation: Bilateral intracranial ICAs are patent and tortuous. Severe right paraclinoid ICA stenosis. Bilateral MCAs and ACAs are patent without proximal hemodynamically  significant stenosis. Posterior circulation: Bilateral intradural vertebral arteries, basilar artery and bilateral posterior cerebral arteries are patent. Severe left P2 PCA stenosis. Venous sinuses: As permitted by contrast timing, patent. Review of the MIP images confirms the above findings IMPRESSION: 1. No emergent large vessel occlusion. 2. 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 Electronically Signed   By: Feliberto Harts M.D.   On: 03/02/2023 15:41  MR Brain W and Wo Contrast Result Date: 03/02/2023 CLINICAL DATA:  Neuro deficit, persistent/recurrent. CNS neoplasm suspected. EXAM: MRI HEAD WITHOUT AND WITH CONTRAST TECHNIQUE: Multiplanar, multiecho pulse sequences of the brain and surrounding structures were obtained without and with intravenous contrast. CONTRAST:  7mL GADAVIST GADOBUTROL 1 MMOL/ML IV SOLN COMPARISON:  Head CT yesterday. FINDINGS: Brain: Diffusion imaging shows a focal acute infarction in the dorsal pons just to the right of midline. No other acute infarction. Chronic small-vessel ischemic changes are otherwise present diffusely throughout the pons and middle cerebellar peduncles. Cerebral hemispheres show confluent abnormal T2 and FLAIR signal throughout the deep and subcortical white matter. No large vessel territory stroke. No mass, hemorrhage, hydrocephalus or extra-axial collection. Small-vessel disease is favored in this patient with renal failure, diabetes and hypertension. Demyelinating disease is not excluded but much less likely. Vascular: Major vessels at the base of the brain show flow. Skull and upper cervical spine: Negative Sinuses/Orbits: Mucosal inflammatory changes of the paranasal sinuses on the right. No advanced sinusitis. Orbits negative. Other: None IMPRESSION: 1. Focal acute infarction in the dorsal pons just to the right of midline. 2. Extensive chronic small-vessel ischemic changes throughout the pons, middle cerebellar peduncles and cerebral  hemispheric white matter. Small-vessel disease is favored in this patient with renal failure, diabetes and hypertension. Demyelinating disease is not excluded but much less likely. Electronically Signed   By: Paulina Fusi M.D.   On: 03/02/2023 13:13   CT Head Wo Contrast Result Date: 03/01/2023 CLINICAL DATA:  Neuro deficit, acute, stroke suspected EXAM: CT HEAD WITHOUT CONTRAST TECHNIQUE: Contiguous axial images were obtained from the base of the skull through the vertex without intravenous contrast. RADIATION DOSE REDUCTION: This exam was performed according to the departmental dose-optimization program which includes automated exposure control, adjustment of the mA and/or kV according to patient size and/or use of iterative reconstruction technique. COMPARISON:  None Available. FINDINGS: Brain: No evidence of acute infarction, hemorrhage, hydrocephalus, extra-axial collection or mass lesion/mass effect. Vascular: No hyperdense vessel identified. Skull: No acute fracture. Sinuses/Orbits: Mostly clear sinuses.  No acute orbital findings. Other: No mastoid effusions. IMPRESSION: No evidence of acute intracranial abnormality. Electronically Signed   By: Feliberto Harts M.D.   On: 03/01/2023 19:47   DG Chest 1 View Result Date: 03/01/2023 CLINICAL DATA:  Chest pain EXAM: CHEST  1 VIEW portable upright COMPARISON:  X-ray 02/24/2022 FINDINGS: Film is under penetrated. No consolidation, pneumothorax or effusion. No edema. Normal cardiopericardial silhouette. Calcified aorta. Stable right IJ double-lumen catheter with the tip overlying the right atrium. IMPRESSION: No acute cardiopulmonary disease.  Right IJ double-lumen catheter. Electronically Signed   By: Karen Kays M.D.   On: 03/01/2023 18:15    Labs: BNP (last 3 results) No results for input(s): "BNP" in the last 8760 hours. Basic Metabolic Panel: Recent Labs  Lab 03/01/23 1639 03/03/23 0541 03/03/23 0544  NA 132*  --  136  K 4.1  --  4.7  CL  95*  --  102  CO2 24  --  22  GLUCOSE 336*  --  84  BUN 19  --  35*  CREATININE 4.10*  --  7.91*  CALCIUM 8.4*  --  8.9  PHOS  --  5.8* 5.8*   Liver Function Tests: Recent Labs  Lab 03/01/23 1639 03/03/23 0544  AST 18  --   ALT 20  --   ALKPHOS 84  --   BILITOT 0.4  --   PROT 7.4  --  ALBUMIN 3.4* 3.2*   No results for input(s): "LIPASE", "AMYLASE" in the last 168 hours. No results for input(s): "AMMONIA" in the last 168 hours. CBC: Recent Labs  Lab 03/01/23 1639 03/03/23 0544  WBC 8.2 7.8  NEUTROABS 4.6 3.8  HGB 11.3* 10.8*  HCT 33.2* 32.2*  MCV 92.2 92.0  PLT 155 157   Cardiac Enzymes: No results for input(s): "CKTOTAL", "CKMB", "CKMBINDEX", "TROPONINI" in the last 168 hours. BNP: Invalid input(s): "POCBNP" CBG: Recent Labs  Lab 03/03/23 0821 03/03/23 1157 03/03/23 1811 03/04/23 0607 03/04/23 0824  GLUCAP 111* 241* 145* 199* 204*   D-Dimer No results for input(s): "DDIMER" in the last 72 hours. Hgb A1c Recent Labs    03/03/23 1820  HGBA1C 9.3*  Lipid Profile Recent Labs    03/03/23 0544  CHOL 109  HDL 22*  LDLCALC 54  TRIG 629*  CHOLHDL 5.0      Component Value Date/Time   COLORURINE YELLOW 11/03/2021 1010   APPEARANCEUR CLEAR 11/03/2021 1010   LABSPEC 1.012 11/03/2021 1010   PHURINE 7.0 11/03/2021 1010   GLUCOSEU >=500 (A) 11/03/2021 1010   HGBUR NEGATIVE 11/03/2021 1010   BILIRUBINUR NEGATIVE 11/03/2021 1010   KETONESUR NEGATIVE 11/03/2021 1010   PROTEINUR >=300 (A) 11/03/2021 1010   UROBILINOGEN 0.2 10/19/2014 1023   NITRITE NEGATIVE 11/03/2021 1010   LEUKOCYTESUR NEGATIVE 11/03/2021 1010   Sepsis Labs Recent Labs  Lab 03/01/23 1639 03/03/23 0544  WBC 8.2 7.8   Microbiology No results found for this or any previous visit (from the past 240 hours). Time coordinating discharge: 25 minutes  SIGNED: Lanae Boast, MD  [facility] 03/04/2023, 10:33 AM  If 7PM-7AM, please contact night-coverage [email or web address]

## 2023-03-04 NOTE — Progress Notes (Signed)
Latoya Walsh Progress Note   Subjective:   Reports she is going home today. Denies SOB, CP, dizziness, nausea.   Objective Vitals:   03/03/23 2345 03/04/23 0516 03/04/23 0736 03/04/23 1105  BP: 105/72 110/77 102/66 98/65  Pulse: 88 (!) 108 86 88  Resp: 18 18 17 17   Temp: 98.7 F (37.1 C) 98.4 F (36.9 C) 97.7 F (36.5 C) 98.2 F (36.8 C)  TempSrc: Oral Oral Oral Oral  SpO2: 100% 99% 99% 100%  Weight:      Height:       Physical Exam General: Alert female in NAD Heart: RRR, no murmurs, rubs or gallops Lungs: CTA bilaterally, respirations unlabored on RA Abdomen: Soft, non-distended, +BS Extremities: No edema b/l lower extremities Dialysis Access:  AVF + faint bruit, R internal jugular TDC  Additional Objective Labs: Basic Metabolic Panel: Recent Labs  Lab 03/01/23 1639 03/03/23 0541 03/03/23 0544  NA 132*  --  136  K 4.1  --  4.7  CL 95*  --  102  CO2 24  --  22  GLUCOSE 336*  --  84  BUN 19  --  35*  CREATININE 4.10*  --  7.91*  CALCIUM 8.4*  --  8.9  PHOS  --  5.8* 5.8*   Liver Function Tests: Recent Labs  Lab 03/01/23 1639 03/03/23 0544  AST 18  --   ALT 20  --   ALKPHOS 84  --   BILITOT 0.4  --   PROT 7.4  --   ALBUMIN 3.4* 3.2*   No results for input(s): "LIPASE", "AMYLASE" in the last 168 hours. CBC: Recent Labs  Lab 03/01/23 1639 03/03/23 0544  WBC 8.2 7.8  NEUTROABS 4.6 3.8  HGB 11.3* 10.8*  HCT 33.2* 32.2*  MCV 92.2 92.0  PLT 155 157   Blood Culture    Component Value Date/Time   SDES  11/03/2021 1010    IN/OUT CATH URINE Performed at [facility], [address], [place], [place] [address]    [facility]  11/03/2021 1010    NONE Performed at [facility], [address], [place], [place] [address]    CULT  11/03/2021 1010    NO GROWTH Performed at [facility], [address], [place], [place] [address]    REPTSTATUS 11/05/2021 FINAL 11/03/2021 1010    Cardiac Enzymes: No results for input(s):  "CKTOTAL", "CKMB", "CKMBINDEX", "TROPONINI" in the last 168 hours. CBG: Recent Labs  Lab 03/03/23 1157 03/03/23 1811 03/04/23 0607 03/04/23 0824 03/04/23 1106  GLUCAP 241* 145* 199* 204* 226*   Iron Studies: No results for input(s): "IRON", "TIBC", "TRANSFERRIN", "FERRITIN" in the last 72 hours. @lablastinr3 @ Studies/Results: ECHOCARDIOGRAM COMPLETE Result Date: 03/03/2023    ECHOCARDIOGRAM REPORT   Patient Name:   Latoya Walsh Date of Exam: 03/03/2023 Medical Rec #:  [id number]        Height:       67.0 in Accession #:    [id number]       Weight:       163.0 lb Date of Birth:  [birth year]       BSA:          1.854 m Patient Age:    61 years         BP:           121/61 mmHg Patient Gender: F                HR:  73 bpm. Exam Location:  Inpatient Procedure: 2D Echo, Cardiac Doppler and Color Doppler Indications:    Stroke  History:        Patient has prior history of Echocardiogram examinations, most                 recent 07/29/2021. Risk Factors:Diabetes and Former Smoker.  Sonographer:    Karma Ganja Referring Phys: [id number] [facility]  Sonographer Comments: Image acquisition challenging due to patient behavioral factors. IMPRESSIONS  1. Left ventricular ejection fraction, by estimation, is 55 to 60%. The left ventricle has normal function. The left ventricle has no regional wall motion abnormalities. There is mild left ventricular hypertrophy. Left ventricular diastolic parameters are indeterminate.  2. Right ventricular systolic function is normal. The right ventricular size is normal.  3. A small pericardial effusion is present. There is no evidence of cardiac tamponade.  4. The mitral valve is grossly normal. Trivial mitral valve regurgitation. No evidence of mitral stenosis.  5. The aortic valve is tricuspid. There is mild calcification of the aortic valve. Aortic valve regurgitation is not visualized. Aortic valve sclerosis is present, with no evidence of aortic valve  stenosis. Comparison(s): No significant change from prior study. Conclusion(s)/Recommendation(s): No intracardiac source of embolism detected on this transthoracic study. Consider a transesophageal echocardiogram to exclude cardiac source of embolism if clinically indicated. FINDINGS  Left Ventricle: Left ventricular ejection fraction, by estimation, is 55 to 60%. The left ventricle has normal function. The left ventricle has no regional wall motion abnormalities. The left ventricular internal cavity size was normal in size. There is  mild left ventricular hypertrophy. Left ventricular diastolic parameters are indeterminate. Right Ventricle: The right ventricular size is normal. No increase in right ventricular wall thickness. Right ventricular systolic function is normal. Left Atrium: Left atrial size was normal in size. Right Atrium: Right atrial size was normal in size. Pericardium: A small pericardial effusion is present. There is no evidence of cardiac tamponade. Mitral Valve: The mitral valve is grossly normal. Trivial mitral valve regurgitation. No evidence of mitral valve stenosis. Tricuspid Valve: The tricuspid valve is grossly normal. Tricuspid valve regurgitation is mild . No evidence of tricuspid stenosis. Aortic Valve: The aortic valve is tricuspid. There is mild calcification of the aortic valve. Aortic valve regurgitation is not visualized. Aortic valve sclerosis is present, with no evidence of aortic valve stenosis. Aortic valve mean gradient measures 3.0 mmHg. Aortic valve peak gradient measures 5.1 mmHg. Aortic valve area, by VTI measures 1.84 cm. Pulmonic Valve: The pulmonic valve was not well visualized. Pulmonic valve regurgitation is not visualized. No evidence of pulmonic stenosis. Aorta: The aortic root is normal in size and structure and the ascending aorta was not well visualized. Venous: The inferior vena cava was not well visualized. IAS/Shunts: The interatrial septum was not well  visualized.  LEFT VENTRICLE PLAX 2D LVIDd:         4.10 cm   Diastology LVIDs:         2.90 cm   LV e' medial:    4.35 cm/s LV PW:         1.20 cm   LV E/e' medial:  17.6 LV IVS:        1.30 cm   LV e' lateral:   8.59 cm/s LVOT diam:     2.00 cm   LV E/e' lateral: 8.9 LV SV:         45 LV SV Index:   24 LVOT Area:  3.14 cm  RIGHT VENTRICLE            IVC RV Basal diam:  3.40 cm    IVC diam: 1.40 cm RV S prime:     9.03 cm/s TAPSE (M-mode): 2.2 cm LEFT ATRIUM             Index        RIGHT ATRIUM           Index LA diam:        3.20 cm 1.73 cm/m   RA Area:     11.60 cm LA Vol (A2C):   46.4 ml 25.03 ml/m  RA Volume:   24.50 ml  13.22 ml/m LA Vol (A4C):   24.2 ml 13.05 ml/m LA Biplane Vol: 33.2 ml 17.91 ml/m  AORTIC VALVE AV Area (Vmax):    1.94 cm AV Area (Vmean):   1.76 cm AV Area (VTI):     1.84 cm AV Vmax:           113.00 cm/s AV Vmean:          78.700 cm/s AV VTI:            0.243 m AV Peak Grad:      5.1 mmHg AV Mean Grad:      3.0 mmHg LVOT Vmax:         69.80 cm/s LVOT Vmean:        44.100 cm/s LVOT VTI:          0.142 m LVOT/AV VTI ratio: 0.58  AORTA Ao Root diam: 3.00 cm MITRAL VALVE               TRICUSPID VALVE MV Area (PHT): 4.21 cm    TR Peak grad:   25.2 mmHg MV Decel Time: 180 msec    TR Vmax:        251.00 cm/s MV E velocity: 76.70 cm/s MV A velocity: 90.00 cm/s  SHUNTS MV E/A ratio:  0.85        Systemic VTI:  0.14 m                            Systemic Diam: 2.00 cm Jodelle Red MD Electronically signed by Jodelle Red MD Signature Date/Time: 03/03/2023/12:22:09 PM    Final    CT ANGIO HEAD NECK W WO CM Result Date: 03/02/2023 CLINICAL DATA:  [place] EXAM: CT ANGIOGRAPHY HEAD AND NECK WITH AND WITHOUT CONTRAST TECHNIQUE: Multidetector CT imaging of the head and neck was performed using the standard protocol during bolus administration of intravenous contrast. Multiplanar CT image reconstructions and MIPs were obtained to evaluate the vascular anatomy. Carotid  stenosis measurements (when applicable) are obtained utilizing NASCET criteria, using the distal internal carotid diameter as the denominator. RADIATION DOSE REDUCTION: This exam was performed according to the departmental dose-optimization program which includes automated exposure control, adjustment of the mA and/or kV according to patient size and/or use of iterative reconstruction technique. CONTRAST:  75mL OMNIPAQUE IOHEXOL 350 MG/ML SOLN COMPARISON:  CT head 03/01/2023. FINDINGS: CT HEAD FINDINGS Brain: No evidence of acute infarction, hemorrhage, hydrocephalus, extra-axial collection or mass lesion/mass effect. Patchy white matter hypodensities are nonspecific but compatible with chronic microvascular ischemic change. Vascular: See below. Skull: No acute fracture. Sinuses/Orbits: Paranasal sinus mucosal thickening. No acute orbital findings. Other: No mastoid effusions Review of the MIP images confirms the above findings CTA NECK FINDINGS Aortic arch: The great vessel origins are patent  without significant stenosis. Atherosclerosis. Right carotid system: Atherosclerosis at the carotid bifurcation without greater than 50% stenosis. Left carotid system: Atherosclerosis at the carotid bifurcation without greater than 50% stenosis. Vertebral arteries: The vertebral arteries are patent without greater than 50% stenosis. Skeleton: No acute abnormality on limited assessment. Multilevel degenerative change. Other neck: No acute abnormality on limited assessment. Upper chest: Visualized lung apices are clear. Review of the MIP images confirms the above findings CTA HEAD FINDINGS Anterior circulation: Bilateral intracranial ICAs are patent and tortuous. Severe right paraclinoid ICA stenosis. Bilateral MCAs and ACAs are patent without proximal hemodynamically significant stenosis. Posterior circulation: Bilateral intradural vertebral arteries, basilar artery and bilateral posterior cerebral arteries are patent. Severe  left P2 PCA stenosis. Venous sinuses: As permitted by contrast timing, patent. Review of the MIP images confirms the above findings IMPRESSION: 1. No emergent large vessel occlusion. 2. 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 Electronically Signed   By: Feliberto Harts M.D.   On: 03/02/2023 15:41   Medications:   aspirin EC  81 mg Oral Daily   atorvastatin  40 mg Oral Daily   busPIRone  5 mg Oral BID   Chlorhexidine Gluconate Cloth  6 each Topical Q0600   clopidogrel  75 mg Oral Daily   heparin  5,000 Units Subcutaneous Q8H   insulin aspart  0-5 Units Subcutaneous QHS   insulin aspart  0-9 Units Subcutaneous TID WC   mirtazapine  15 mg Oral QPM    Dialysis Orders: MWF [facility]  4h  400/1.5   72.5kg   2/2.5 bath  TDC   Heparin 4000 - last OP HD 12/09, post wt 72.9kg - has been getting to dry wt, good compliance  Assessment/Plan: Acute CVA - per neuro/ pmd, neuro recommended aspirin and plavix, cleared by PT/OT ESKD - on HD MWF.  Had HD Wednesday, resume MWF schedule outpatient tomorrow HTN  - bp's are stable, cont home meds.  Volume - No volume overload on exam, close to EDW.  Anemia of eskd - Hb 10.8, follow. No esa needs.  MBD ckd - CCa in range, phos slightly elevated. Continue low phos diet/home binders      Rogers Blocker, PA-C 03/04/2023, 1:32 PM  [facility] Pager: [phone]

## 2023-03-04 NOTE — Plan of Care (Signed)
  Problem: Education: Goal: Ability to describe self-care measures that may prevent or decrease complications (Diabetes Survival Skills Education) will improve Outcome: Progressing Goal: Individualized Educational Video(s) Outcome: Progressing   Problem: Coping: Goal: Ability to adjust to condition or change in health will improve Outcome: Progressing   Problem: Fluid Volume: Goal: Ability to maintain a balanced intake and output will improve Outcome: Progressing   Problem: Health Behavior/Discharge Planning: Goal: Ability to identify and utilize available resources and services will improve Outcome: Progressing Goal: Ability to manage health-related needs will improve Outcome: Progressing   Problem: Metabolic: Goal: Ability to maintain appropriate glucose levels will improve Outcome: Progressing   Problem: Nutritional: Goal: Maintenance of adequate nutrition will improve Outcome: Progressing Goal: Progress toward achieving an optimal weight will improve Outcome: Progressing   Problem: Skin Integrity: Goal: Risk for impaired skin integrity will decrease Outcome: Progressing   Problem: Tissue Perfusion: Goal: Adequacy of tissue perfusion will improve Outcome: Progressing   Problem: Education: Goal: Knowledge of General Education information will improve Description: Including pain rating scale, medication(s)/side effects and non-pharmacologic comfort measures Outcome: Progressing   Problem: Health Behavior/Discharge Planning: Goal: Ability to manage health-related needs will improve Outcome: Progressing   Problem: Clinical Measurements: Goal: Ability to maintain clinical measurements within normal limits will improve Outcome: Progressing Goal: Will remain free from infection Outcome: Progressing Goal: Diagnostic test results will improve Outcome: Progressing Goal: Respiratory complications will improve Outcome: Progressing Goal: Cardiovascular complication will  be avoided Outcome: Progressing   Problem: Activity: Goal: Risk for activity intolerance will decrease Outcome: Progressing   Problem: Nutrition: Goal: Adequate nutrition will be maintained Outcome: Progressing   Problem: Coping: Goal: Level of anxiety will decrease Outcome: Progressing   Problem: Elimination: Goal: Will not experience complications related to bowel motility Outcome: Progressing Goal: Will not experience complications related to urinary retention Outcome: Progressing   Problem: Pain Management: Goal: General experience of comfort will improve Outcome: Progressing   Problem: Safety: Goal: Ability to remain free from injury will improve Outcome: Progressing   Problem: Skin Integrity: Goal: Risk for impaired skin integrity will decrease Outcome: Progressing   Problem: Education: Goal: Knowledge of disease or condition will improve Outcome: Progressing Goal: Knowledge of secondary prevention will improve (MUST DOCUMENT ALL) Outcome: Progressing Goal: Knowledge of patient specific risk factors will improve Loraine Leriche N/A or DELETE if not current risk factor) Outcome: Progressing   Problem: Ischemic Stroke/TIA Tissue Perfusion: Goal: Complications of ischemic stroke/TIA will be minimized Outcome: Progressing   Problem: Coping: Goal: Will verbalize positive feelings about self Outcome: Progressing Goal: Will identify appropriate support needs Outcome: Progressing   Problem: Health Behavior/Discharge Planning: Goal: Ability to manage health-related needs will improve Outcome: Progressing Goal: Goals will be collaboratively established with patient/family Outcome: Progressing   Problem: Self-Care: Goal: Ability to participate in self-care as condition permits will improve Outcome: Progressing Goal: Verbalization of feelings and concerns over difficulty with self-care will improve Outcome: Progressing Goal: Ability to communicate needs accurately will  improve Outcome: Progressing   Problem: Nutrition: Goal: Risk of aspiration will decrease Outcome: Progressing Goal: Dietary intake will improve Outcome: Progressing

## 2023-03-04 NOTE — Progress Notes (Signed)
D/C order noted. Contacted [facility] to advise clinic if pt's d/c today and that pt should resume care tomorrow.   Olivia Canter Renal Navigator [phone]

## 2023-03-04 NOTE — TOC Transition Note (Signed)
Transition of Care [facility]) - Discharge Note   Patient Details  Name: Latoya Walsh MRN: [id number] Date of Birth: 1960/06/20  [facility]) CM/SW Contact:  Kermit Balo, RN Phone Number: 03/04/2023, 10:41 AM   Clinical Narrative:     Pt is discharging home with self care. No needs per PT.  Pt states her son will transport her home after 4 pm.   Final next level of care: Home/Self Care Barriers to Discharge: No Barriers Identified   Patient Goals and CMS Choice            Discharge Placement                       Discharge Plan and Services Additional resources added to the After Visit Summary for                                       Social Drivers of Health (SDOH) Interventions SDOH Screenings   Food Insecurity: Food Insecurity Present (03/02/2023)  Housing: Low Risk  (03/04/2023)  Transportation Needs: Unmet Transportation Needs (03/02/2023)  Utilities: Not At Risk (03/02/2023)  Alcohol Screen: Low Risk  (12/24/2020)  Depression (PHQ2-9): High Risk (02/23/2023)  Financial Resource Strain: Low Risk  (07/26/2020)  Physical Activity: Inactive (12/19/2020)  Social Connections: Moderately Integrated (12/19/2020)  Stress: Stress Concern Present (08/21/2021)  Tobacco Use: High Risk (03/01/2023)     Readmission Risk Interventions    11/04/2021   12:21 PM 06/09/2020    1:09 PM  Readmission Risk Prevention Plan  Transportation Screening Complete Complete  HRI or Home Care Consult Complete   Social Work Consult for Recovery Care Planning/Counseling Complete   Palliative Care Screening Not Applicable   Medication Review Oceanographer) Complete Complete  PCP or Specialist appointment within 3-5 days of discharge  Complete  HRI or Home Care Consult  Complete  SW Recovery Care/Counseling Consult  Complete  Palliative Care Screening  Not Applicable  Skilled Nursing Facility  Not Applicable

## 2023-03-04 NOTE — Discharge Planning (Signed)
[facility] Patient Discharge Orders- [facility] CLINIC: [facility]  Patient's name: Latoya Walsh Admit/DC Dates: 03/01/2023 -   Discharge Diagnoses: Acute CVA    Aranesp: Given: no   Date and amount of last dose: N/A  Last Hgb: 10.8 PRBC's Given: no Date/# of units: N/A ESA dose for discharge: none IV Iron dose at discharge: none  Heparin change: no  EDW Change: no New EDW:   Bath Change: no  Access intervention/Change: no Details:  Hectorol/Calcitriol change: no  Discharge Labs: Calcium 8.9 Phosphorus 5.8 Albumin 3.2 K+ 4.7  IV Antibiotics: no Details:  On Coumadin?: no Last INR: Next INR: Managed By:   OTHER/APPTS/LAB ORDERS:    D/C Meds to be reconciled by nurse after every discharge.  Completed By: Rogers Blocker, PA-C 03/04/2023, 2:39 PM  [facility] Pager: [phone]    Reviewed by: MD:______ RN_______

## 2023-03-04 NOTE — Progress Notes (Signed)
OT Cancellation Note  Patient Details Name: Latoya Walsh MRN: [id number] DOB: 01-03-1961   Cancelled Treatment:    Reason Eval/Treat Not Completed: OT screened, no needs identified, will sign off   Limmie Patricia, OTR/L,CBIS  Supplemental OT - [facility] and [facility] Secure Chat Preferred   03/04/2023, 2:30 PM

## 2023-03-05 LAB — HEPATITIS B SURFACE ANTIBODY, QUANTITATIVE: Hep B S AB Quant (Post): 1190 m[IU]/mL

## 2023-03-06 ENCOUNTER — Telehealth: Payer: Self-pay | Admitting: Nurse Practitioner

## 2023-03-06 NOTE — Telephone Encounter (Signed)
Transition of Care - Initial Contact from Inpatient Facility  Date of discharge: 03/04/2023 Date of contact: 03/06/2023  Method: Phone Spoke to: [employer] Daughter  Patient did not answer phone, voice mail for telephone has not been set up. Contacted patient's daughter who is listed as home phone number  to discuss transition of care from recent inpatient hospitalization. Patient was admitted to [facility] from 12/09-12/02/2023 with discharge diagnosis of CVA  The discharge medication list was reviewed. Patient understands the changes and has no concerns.   Patient will return to his/her outpatient HD unit on: 03/05/2023 (Patient did attend HD)  No other concerns at this time.

## 2023-03-09 ENCOUNTER — Ambulatory Visit: Payer: Medicaid Other | Admitting: "Endocrinology

## 2023-03-09 ENCOUNTER — Other Ambulatory Visit: Payer: Self-pay | Admitting: Family

## 2023-03-10 ENCOUNTER — Emergency Department (HOSPITAL_COMMUNITY)
Admission: EM | Admit: 2023-03-10 | Discharge: 2023-03-11 | Disposition: A | Discharge status: Home / Self Care | Payer: Medicaid Other | Attending: Emergency Medicine | Admitting: Emergency Medicine

## 2023-03-10 ENCOUNTER — Emergency Department (HOSPITAL_COMMUNITY): Payer: Medicaid Other

## 2023-03-10 ENCOUNTER — Encounter (HOSPITAL_COMMUNITY): Payer: Self-pay | Admitting: Emergency Medicine

## 2023-03-10 ENCOUNTER — Encounter: Payer: Self-pay | Admitting: Family

## 2023-03-10 ENCOUNTER — Other Ambulatory Visit: Payer: Self-pay

## 2023-03-10 DIAGNOSIS — J0100 Acute maxillary sinusitis, unspecified: Secondary | ICD-10-CM | POA: Insufficient documentation

## 2023-03-10 DIAGNOSIS — J029 Acute pharyngitis, unspecified: Secondary | ICD-10-CM | POA: Insufficient documentation

## 2023-03-10 DIAGNOSIS — Z8616 Personal history of COVID-19: Secondary | ICD-10-CM | POA: Diagnosis not present

## 2023-03-10 DIAGNOSIS — I110 Hypertensive heart disease with heart failure: Secondary | ICD-10-CM | POA: Insufficient documentation

## 2023-03-10 DIAGNOSIS — I509 Heart failure, unspecified: Secondary | ICD-10-CM | POA: Diagnosis not present

## 2023-03-10 DIAGNOSIS — E119 Type 2 diabetes mellitus without complications: Secondary | ICD-10-CM | POA: Diagnosis not present

## 2023-03-10 DIAGNOSIS — R519 Headache, unspecified: Secondary | ICD-10-CM | POA: Diagnosis present

## 2023-03-10 DIAGNOSIS — R739 Hyperglycemia, unspecified: Secondary | ICD-10-CM | POA: Diagnosis not present

## 2023-03-10 LAB — CBC WITH DIFFERENTIAL/PLATELET
Abs Immature Granulocytes: 0.08 10*3/uL — ABNORMAL HIGH (ref 0.00–0.07)
Basophils Absolute: 0.1 10*3/uL (ref 0.0–0.1)
Basophils Relative: 1 %
Eosinophils Absolute: 0.1 10*3/uL (ref 0.0–0.5)
Eosinophils Relative: 1 %
HCT: 37.8 % (ref 36.0–46.0)
Hemoglobin: 12.5 g/dL (ref 12.0–15.0)
Immature Granulocytes: 1 %
Lymphocytes Relative: 18 %
Lymphs Abs: 2.2 10*3/uL (ref 0.7–4.0)
MCH: 30.6 pg (ref 26.0–34.0)
MCHC: 33.1 g/dL (ref 30.0–36.0)
MCV: 92.4 fL (ref 80.0–100.0)
Monocytes Absolute: 0.9 10*3/uL (ref 0.1–1.0)
Monocytes Relative: 7 %
Neutro Abs: 8.9 10*3/uL — ABNORMAL HIGH (ref 1.7–7.7)
Neutrophils Relative %: 72 %
Platelets: 219 10*3/uL (ref 150–400)
RBC: 4.09 MIL/uL (ref 3.87–5.11)
RDW: 13.8 % (ref 11.5–15.5)
WBC: 12.1 10*3/uL — ABNORMAL HIGH (ref 4.0–10.5)
nRBC: 0 % (ref 0.0–0.2)

## 2023-03-10 LAB — BASIC METABOLIC PANEL
Anion gap: 16 — ABNORMAL HIGH (ref 5–15)
BUN: 21 mg/dL — ABNORMAL HIGH (ref 6–20)
CO2: 24 mmol/L (ref 22–32)
Calcium: 9.3 mg/dL (ref 8.9–10.3)
Chloride: 93 mmol/L — ABNORMAL LOW (ref 98–111)
Creatinine, Ser: 4.82 mg/dL — ABNORMAL HIGH (ref 0.44–1.00)
GFR, Estimated: 10 mL/min — ABNORMAL LOW (ref >60)
Glucose, Bld: 261 mg/dL — ABNORMAL HIGH (ref 70–99)
Potassium: 4 mmol/L (ref 3.5–5.1)
Sodium: 133 mmol/L — ABNORMAL LOW (ref 135–145)

## 2023-03-10 NOTE — ED Triage Notes (Signed)
Patient bib [facility] from home for left jaw and ear pain. Endorses cough since yesterday. MFW dialysis, no missed sessions. Denies chest pain or shob.    BP 160 palp, HR 113 Spo2 97% CBG 279

## 2023-03-10 NOTE — Telephone Encounter (Signed)
 Care team updated and letter sent for eye exam notes.

## 2023-03-10 NOTE — ED Provider Triage Note (Signed)
Emergency Medicine Provider Triage Evaluation Note  LEISL DENMAN , a 62 y.o. female  was evaluated in triage.  Pt complains of pain on the left side of her face that began this morning.  Patient had a CVA this past week..  Review of Systems    Physical Exam  BP (!) 148/94 (BP Location: Right Arm)   Pulse (!) 114   Temp 99.5 F (37.5 C) (Oral)   Resp 17   Ht 5\' 7"  (1.702 m)   Wt 72.6 kg   LMP 06/23/2010   SpO2 100%   BMI 25.06 kg/m  Gen:   Awake, no distress   Resp:  Normal effort  MSK:   Moves extremities without difficulty  Other:  Mouth-globally poor dentition.  No overlying cellulitis on the face.  Medical Decision Making  Medically screening exam initiated at 9:59 PM.  Appropriate orders placed.  ADAMARI GOULETTE was informed that the remainder of the evaluation will be completed by another provider, this initial triage assessment does not replace that evaluation, and the importance of remaining in the ED until their evaluation is complete.  Facial pain.  Consider underlying infection.  Patient's dentition a bit difficult to see as she has a small mouth and a large tongue.  No mastoid tenderness.  Sinusitis.  Will Obtain Imaging.  Basic Labs Ordered.   Anders Simmonds T, DO 03/10/23 2201

## 2023-03-11 ENCOUNTER — Ambulatory Visit: Payer: Medicaid Other | Admitting: Dietician

## 2023-03-11 ENCOUNTER — Encounter: Payer: Medicaid Other | Admitting: Family

## 2023-03-11 ENCOUNTER — Other Ambulatory Visit (HOSPITAL_COMMUNITY): Payer: Self-pay

## 2023-03-11 MED ORDER — CLINDAMYCIN HCL 150 MG PO CAPS
150.0000 mg | ORAL_CAPSULE | Freq: Three times a day (TID) | ORAL | 0 refills | Status: AC
  Filled 2023-03-11: qty 21, 7d supply, fill #0

## 2023-03-11 MED ORDER — CLINDAMYCIN HCL 150 MG PO CAPS
450.0000 mg | ORAL_CAPSULE | Freq: Once | ORAL | Status: AC
  Administered 2023-03-11: 450 mg via ORAL
  Filled 2023-03-11 (×2): qty 3

## 2023-03-11 NOTE — ED Provider Notes (Signed)
Emergency Department Provider Note   I have reviewed the triage vital signs and the nursing notes.   HISTORY  Chief Complaint Facial Pain   HPI Latoya Walsh is a 62 y.o. female past history very reviewed below presents to the emergency department with face discomfort.  Symptoms began yesterday.  She describes some sore throat but also pain in the face.  No known fever at home although in the waiting room he did spike a temp to 100.4 F.  Denies any dental pain.  No vision changes.  No facial rash.  No severe headaches. Notes a recent admit for CVA.   Past Medical History:  Diagnosis Date   Allergic rhinitis    Anxiety    CHF (congestive heart failure) (HCC)    a. EF 30-35% by echo in 05/2020 b. EF at 45% in 09/2020   Chronic back pain    Chronic hepatitis C without hepatic coma (HCC)    COVID-19 virus infection 12/26/2019   Depression    Diabetes mellitus    Hepatitis C    Hypertension    Noncompliance    Poor appetite 07/17/2014   Substance abuse (HCC)    HX of drug use and alcohol use    Review of Systems  Constitutional: No fever/chills Eyes: No visual changes. ENT: Positive sore throat and left face pain.  Cardiovascular: Denies chest pain. Respiratory: Denies shortness of breath. Gastrointestinal: No abdominal pain.  No nausea, no vomiting.  Musculoskeletal: Negative for back pain. Skin: Negative for rash. Neurological: Negative for headaches. ____________________________________________   PHYSICAL EXAM:  VITAL SIGNS: ED Triage Vitals  Encounter Vitals Group     BP 03/10/23 2111 (!) 148/94     Pulse Rate 03/10/23 2111 (!) 114     Resp 03/10/23 2111 17     Temp 03/10/23 2111 99.5 F (37.5 C)     Temp Source 03/10/23 2111 Oral     SpO2 03/10/23 2111 100 %     Weight 03/10/23 2115 160 lb (72.6 kg)     Height 03/10/23 2115 5\' 7"  (1.702 m)   Constitutional: Alert and oriented. Well appearing and in no acute distress. Eyes: Conjunctivae are  normal.  Head: Atraumatic. Nose: No congestion/rhinnorhea. Mouth/Throat: Mucous membranes are moist.  Oropharynx non-erythematous. No trismus. Tenderness over the bilateral maxillary sinuses.  Neck: No stridor.   Cardiovascular: Normal rate, regular rhythm. Good peripheral circulation. Grossly normal heart sounds.   Respiratory: Normal respiratory effort.  No retractions. Lungs CTAB. Gastrointestinal: Soft and nontender. No distention.  Musculoskeletal: No gross deformities of extremities. Neurologic:  Normal speech and language.  Skin:  Skin is warm, dry and intact. No rash noted.  ____________________________________________   LABS (all labs ordered are listed, but only abnormal results are displayed)  Labs Reviewed  BASIC METABOLIC PANEL - Abnormal; Notable for the following components:      Result Value   Sodium 133 (*)    Chloride 93 (*)    Glucose, Bld 261 (*)    BUN 21 (*)    Creatinine, Ser 4.82 (*)    GFR, Estimated 10 (*)    Anion gap 16 (*)    All other components within normal limits  CBC WITH DIFFERENTIAL/PLATELET - Abnormal; Notable for the following components:   WBC 12.1 (*)    Neutro Abs 8.9 (*)    Abs Immature Granulocytes 0.08 (*)    All other components within normal limits   ____________________________________________  RADIOLOGY  CT Head Wo  Contrast Result Date: 03/10/2023 CLINICAL DATA:  Stroke, follow up; Sinus or nasal mass (Ped 0-17y) EXAM: CT HEAD WITHOUT CONTRAST CT MAXILLOFACIAL WITHOUT CONTRAST TECHNIQUE: Multidetector CT imaging of the head and maxillofacial structures were performed using the standard protocol without intravenous contrast. Multiplanar CT image reconstructions of the maxillofacial structures were also generated. RADIATION DOSE REDUCTION: This exam was performed according to the departmental dose-optimization program which includes automated exposure control, adjustment of the mA and/or kV according to patient size and/or use of  iterative reconstruction technique. COMPARISON:  MRI 03/02/2023 FINDINGS: CT HEAD FINDINGS Brain: No evidence of acute infarction, hemorrhage, hydrocephalus, extra-axial collection or mass lesion/mass effect. Extensive periventricular white matter changes are again identified, likely reflecting the sequela of small vessel ischemia, stable since prior examination. Vascular: No hyperdense vessel or unexpected calcification. Skull: Normal. Negative for fracture or focal lesion. Other: Mastoid air cells and middle ear cavities are clear. CT MAXILLOFACIAL FINDINGS Osseous: No fracture or mandibular dislocation. No destructive process. Orbits: Negative. No traumatic or inflammatory finding. Sinuses: Extensive mucosal thickening within the right maxillary sinus again seen now with developing air-fluid level within this cavity. Remaining paranasal sinuses are clear. Soft tissues: Negative. IMPRESSION: 1. No acute intracranial abnormality. No calvarial fracture. 2. Stable extensive periventricular white matter changes, likely reflecting the sequela of small vessel ischemia. 3. Extensive mucosal thickening within the right maxillary sinus now with developing air-fluid level within this cavity. Correlate clinically for acute sinusitis. Electronically Signed   By: Helyn Numbers M.D.   On: 03/10/2023 23:26   CT Maxillofacial Wo Contrast Result Date: 03/10/2023 CLINICAL DATA:  Stroke, follow up; Sinus or nasal mass (Ped 0-17y) EXAM: CT HEAD WITHOUT CONTRAST CT MAXILLOFACIAL WITHOUT CONTRAST TECHNIQUE: Multidetector CT imaging of the head and maxillofacial structures were performed using the standard protocol without intravenous contrast. Multiplanar CT image reconstructions of the maxillofacial structures were also generated. RADIATION DOSE REDUCTION: This exam was performed according to the departmental dose-optimization program which includes automated exposure control, adjustment of the mA and/or kV according to patient  size and/or use of iterative reconstruction technique. COMPARISON:  MRI 03/02/2023 FINDINGS: CT HEAD FINDINGS Brain: No evidence of acute infarction, hemorrhage, hydrocephalus, extra-axial collection or mass lesion/mass effect. Extensive periventricular white matter changes are again identified, likely reflecting the sequela of small vessel ischemia, stable since prior examination. Vascular: No hyperdense vessel or unexpected calcification. Skull: Normal. Negative for fracture or focal lesion. Other: Mastoid air cells and middle ear cavities are clear. CT MAXILLOFACIAL FINDINGS Osseous: No fracture or mandibular dislocation. No destructive process. Orbits: Negative. No traumatic or inflammatory finding. Sinuses: Extensive mucosal thickening within the right maxillary sinus again seen now with developing air-fluid level within this cavity. Remaining paranasal sinuses are clear. Soft tissues: Negative. IMPRESSION: 1. No acute intracranial abnormality. No calvarial fracture. 2. Stable extensive periventricular white matter changes, likely reflecting the sequela of small vessel ischemia. 3. Extensive mucosal thickening within the right maxillary sinus now with developing air-fluid level within this cavity. Correlate clinically for acute sinusitis. Electronically Signed   By: Helyn Numbers M.D.   On: 03/10/2023 23:26    ____________________________________________   PROCEDURES  Procedure(s) performed:   Procedures  None  ____________________________________________   INITIAL IMPRESSION / ASSESSMENT AND PLAN / ED COURSE  Pertinent labs & imaging results that were available during my care of the patient were reviewed by me and considered in my medical decision making (see chart for details).   This patient is Presenting for Evaluation of face pain,  which does require a range of treatment options, and is a complaint that involves a moderate risk of morbidity and mortality.  The Differential Diagnoses  include sinusitis, dental infection/abscess, tonsillitis, PTA, RPA, etc.  Critical Interventions-    Medications  clindamycin (CLEOCIN) capsule 450 mg (450 mg Oral Given 03/11/23 0735)     Clinical Laboratory Tests Ordered, included leukocytosis to 12.1.  Creatinine consistent with end-stage renal disease.  Potassium normal.  Radiologic Tests Ordered, included CT head and max/face. I independently interpreted the images and agree with radiology interpretation.   Medical Decision Making: Summary:  Patient presents emergency department facial pain.  Patient developed fever in the waiting room to 100.4.  Mild leukocytosis.  Overall patient is nontoxic-appearing.  No altered mental status, headache to strongly suspect CNS infection.  Clinically, patient has evidence of sinusitis.  I do not appreciate any mastoid tenderness or concern for malignant otitis externa. No trismus or hard signs to strongly suspect deeper space neck infection. Plan to start Clindamycin empirically with Penicillin allergy in the chart. Patient to keep her HD appointment tomorrow. Discussed strict ED return precautions. Doubt sepsis with minimal SIRS vitals and overall reassuring clinical appearance. Vitals have normalized upon placement in the acute care area.   Patient's presentation is most consistent with acute, uncomplicated illness.   Disposition: discharge  ____________________________________________  FINAL CLINICAL IMPRESSION(S) / ED DIAGNOSES  Final diagnoses:  Acute non-recurrent maxillary sinusitis  Pharyngitis, unspecified etiology     NEW OUTPATIENT MEDICATIONS STARTED DURING THIS VISIT:  Discharge Medication List as of 03/11/2023  6:29 AM     START taking these medications   Details  clindamycin (CLEOCIN) 150 MG capsule Take 1 capsule (150 mg total) by mouth 3 (three) times daily for 7 days., Starting Thu 03/11/2023, Until Thu 03/18/2023, Normal        Note:  This document was prepared using  Dragon voice recognition software and may include unintentional dictation errors.  Alona Bene, MD, [facility] Emergency Medicine    Avagail Whittlesey, Arlyss Repress, MD 03/12/23 [phone]

## 2023-03-11 NOTE — ED Notes (Signed)
Calling daughter for ride. TOC pharm opens in 20 minutes discussed.

## 2023-03-11 NOTE — ED Notes (Signed)
Orders received to give clinda and d/c. D/c instructions given by previous RN. Pt alert, NAD, calm, interactive. Denies questions or needs. Steady gait. TOC pharm and hours d/w pt.

## 2023-03-11 NOTE — Discharge Instructions (Signed)
I am starting you on antibiotics. Please take a prescribe. Return with any new or suddenly worsening symptoms. Please keep your next dialysis appointment.

## 2023-03-18 ENCOUNTER — Ambulatory Visit: Payer: Medicaid Other | Admitting: Podiatry

## 2023-03-22 ENCOUNTER — Other Ambulatory Visit: Payer: Self-pay | Admitting: *Deleted

## 2023-03-22 NOTE — Patient Instructions (Signed)
Visit Information  Latoya Walsh was given information about Medicaid Managed Care team care coordination services as a part of their [facility] Medicaid benefit. Latoya Walsh verbally consented to engagement with the [facility] Managed Care team.   If you are experiencing a medical emergency, please call 911 or report to your local emergency department or urgent care.   If you have a non-emergency medical problem during routine business hours, please contact your provider's office and ask to speak with a nurse.   For questions related to your [facility], please call: [phone] or visit the homepage here: [email or web address]  If you would like to schedule transportation through your [facility], please call the following number at least 2 days in advance of your appointment: [phone]   Rides for urgent appointments can also be made after hours by calling Member Services.  Call the Behavioral Health Crisis Line at [phone] [phone], at any time, 24 hours a day, 7 days a week. If you are in danger or need immediate medical attention call 911.  If you would like help to quit smoking, call 1-800-QUIT-NOW (907 837 9416) OR Espaol: 1-855-Djelo-Ya (3-474-259-5638) o para ms informacin haga clic aqu or Text READY to 756-433 to register via text  Latoya Walsh,   Please see education materials related to stroke provided by MyChart link.  Patient verbalizes understanding of instructions and care plan provided today and agrees to view in MyChart. Active MyChart status and patient understanding of how to access instructions and care plan via MyChart confirmed with patient.     Telephone follow up appointment with Managed Medicaid care management team member scheduled for:04/22/23 at 1:15pm  Estanislado Emms RN, BSN [place]  Value-Based Care [facility]  Health RN Care Coordinator [phone]   Following is a copy of your plan of care:  Care Plan : RN Care Manager Plan of Care  Updates made by [name], RN since 03/22/2023 12:00 AM     Problem: Health Management needs related to DMII and HF      Long-Range Goal: Development of Plan of Care to address Health Management needs related to DMII and HF   Start Date: 07/10/2021  Expected End Date: 03/23/2023  Priority: High  Note:   Current Barriers:  Chronic Disease Management support and education needs related to CHF and DMII Latoya Walsh was hospitalized on 03/01/23 for CVA. She has follow up with Neurology/Stroke Clinic on 04/13/23. She needs to reschedule missed PCP visit  RNCM Clinical Goal(s):  Patient will verbalize understanding of plan for management of CHF and DMII as evidenced by patient verbalization of self monitoring activities take all medications exactly as prescribed and will call provider for medication related questions as evidenced by documentation in EMR    attend all scheduled medical appointments: Dialysis on M/W/F, 03/30/23 with Vein and Vascular,  Endocrinology on 04/06/23, and 04/13/23 with Neurology  as evidenced by provider documentation          Interventions: Inter-disciplinary care team collaboration (see longitudinal plan of care) Evaluation of current treatment plan related to  self management and patient's adherence to plan as established by provider Provided therapeutic listening   Stroke:  (Status:New goal.) Long Term Goal Reviewed Importance of taking all medications as prescribed Reviewed Importance of attending all scheduled provider appointments Advised to report any changes in symptoms or exercise tolerance Assessed social determinant of health barriers Discussed the importance of attending appointment with Neurology/Stroke Clinic on  04/13/23  Diabetes:  (Status: Goal on Track (progressing): YES.) Long Term Goal  Lab Results  Component Value  Date   HGBA1C 9.3 (H) 03/03/2023   @ Assessed patient's understanding of A1c goal: <8% Provided education to patient about basic DM disease process; Reviewed prescribed diet with patient diabetic; Counseled on importance of regular laboratory monitoring as prescribed;        Discussed plans with patient for ongoing care management follow up and provided patient with direct contact information for care management team;      Reviewed scheduled/upcoming provider appointments including:  03/30/23 with [facility], 04/06/23 with Endocrinology and 04/13/23 with Neurology ;         Review of patient status, including review of consultants reports, relevant laboratory and other test results, and medications completed;       Assessed social determinant of health barriers;        Encouragement provided Reviewed medications  Discussed the benefits of exercise, advised patient to exercise every day at least 30 minutes Assisted with rescheduling missed PCP appointment-message sent to scheduler in PCP office Provided with [facility] medical transportation [phone] Discussed upcoming Eye exam with [facility] on 05/20/23 at 2pm   Patient Goals/Self-Care Activities: Take medications as prescribed   Attend all scheduled provider appointments Call provider office for new concerns or questions  use salt in moderation eat more whole grains, fruits and vegetables, lean meats and healthy fats dress right for the weather, hot or cold Complete requested lab work

## 2023-03-22 NOTE — Patient Outreach (Signed)
Medicaid Managed Care   Nurse Care Manager Note  03/22/2023 Name:  Latoya Walsh MRN:  [id number] DOB:  06-07-1960  Latoya Walsh is an 62 y.o. year old female who is a primary patient of Walsh, Latoya C, NP.  The [facility] Managed Care Coordination team was consulted for assistance with:    DMII Stroke  [name] was given information about Medicaid Managed Care Coordination team services today. Latoya Walsh Patient agreed to services and verbal consent obtained.  Engaged with patient by telephone for follow up visit in response to provider referral for case management and/or care coordination services.   Patient is participating in a Managed Medicaid Plan:  Yes  Assessments/Interventions:  Review of past medical history, allergies, medications, health status, including review of consultants reports, laboratory and other test data, was performed as part of comprehensive evaluation and provision of chronic care management services.  SDOH (Social Drivers of Health) assessments and interventions performed: SDOH Interventions    Flowsheet Row Office Visit from 02/23/2023 in [facility] Patient Outreach Telephone from 02/16/2023 in [facility] DEPARTMENT Patient Outreach Telephone from 01/28/2023 in [place] POPULATION HEALTH DEPARTMENT Patient Outreach Telephone from 12/30/2022 in [place] POPULATION HEALTH DEPARTMENT Patient Outreach Telephone from 09/30/2022 in [place] POPULATION HEALTH DEPARTMENT Patient Outreach Telephone from 04/28/2022 in [place] POPULATION HEALTH DEPARTMENT  SDOH Interventions        Food Insecurity Interventions -- -- -- Intervention Not Indicated Intervention Not Indicated Intervention Not Indicated  Housing Interventions -- -- -- -- Intervention Not Indicated Intervention Not Indicated  Transportation Interventions -- Payor Benefit  [facility] assisted with arranging transportation with [facility] medical  transportation] Payor Benefit  [RNCM provided with [facility] medical [phone]] Payor Benefit -- Payor Benefit  Utilities Interventions -- -- -- -- -- Intervention Not Indicated  Depression Interventions/Treatment  Currently on Treatment -- -- -- -- --       Care Plan  Allergies  Allergen Reactions   Penicillins Itching and Rash    Medications Reviewed Today     Reviewed by Latoya Dach, RN (Registered Nurse) on 03/22/23 at 1456  Med List Status: <None>   Medication Order Taking? Sig Documenting Provider Last Dose Status Informant  albuterol (VENTOLIN HFA) 108 (90 Base) MCG/ACT inhaler [id number] Yes Inhale 2 puffs into the lungs every 4 (four) hours as needed for wheezing or shortness of breath. Latoya Blinks, DO Taking Active Self, Pharmacy Records, Family Member           Med Note (Walsh, Latoya C   Tue Mar 02, 2023 10:50 AM) Pt is unsure of last dose.   aspirin EC 81 MG tablet [id number] Yes Take 1 tablet (81 mg total) by mouth daily. Swallow whole. Latoya Boast, MD Taking Active   atorvastatin (LIPITOR) 40 MG tablet [id number] Yes Take 1 tablet (40 mg total) by mouth daily. Latoya Boast, MD Taking Active   Blood Glucose Monitoring Suppl (ONETOUCH VERIO FLEX SYSTEM) w/Device Latoya Walsh [id number] Yes USE TO TEST BLOOD SUGAR TWICE DAILY AS DIRECTED *REFILL REQUEST* Walsh, Latoya C, NP Taking Active Self, Pharmacy Records, Family Member  busPIRone (BUSPAR) 5 MG tablet [id number] Yes Take 1 tablet (5 mg total) by mouth 2 (two) times daily. Walsh, Latoya Citrin, NP Taking Active Self, Pharmacy Records, Family Member  clopidogrel (PLAVIX) 75 MG tablet [id number] Yes Take 1 tablet (75 mg total) by mouth daily for 21 days. Latoya Boast, MD Taking Active  COMFORT EZ PEN NEEDLES 32G X 4 MM MISC [id number] Yes USE AS DIRECTED with insulin injections Latoya Gobble, NP Taking Active Self, Pharmacy Records, Family Member  Continuous Glucose Sensor (FREESTYLE LIBRE 3 [place]) [place] [id number]  1  Device by Does not apply route continuous. Place 1 sensor on the skin every 14 days. Use to check glucose continuously Latoya Joice, MD  Active Self, Pharmacy Records, Family Member           Med Note (Latoya Walsh A   Thu Jan 28, 2023  9:09 AM) Has not picked up from the pharmacy  D3-1000 25 MCG (1000 UT) capsule [id number] Yes Take 1,000 Units by mouth daily. [provider] Taking Active Self, Pharmacy Records, Family Member  docusate sodium (COLACE) 100 MG capsule [id number] No Take 1 capsule (100 mg total) by mouth daily as needed for mild constipation.  Patient not taking: Reported on 03/02/2023   Walsh, Latoya Citrin, NP Not Taking Active Self, Pharmacy Records, Family Member  insulin glargine (LANTUS SOLOSTAR) 100 UNIT/ML Solostar Pen [id number] Yes Inject 38 Units into the skin at bedtime.  Patient taking differently: Inject 46 Units into the skin daily.   Walsh, Latoya Citrin, NP Taking Active Self, Pharmacy Records, Family Member           Med Note (Walsh, Latoya C   Tue Mar 02, 2023 10:32 AM)    insulin lispro (HUMALOG KWIKPEN) 100 UNIT/ML KwikPen [id number] Yes Inject 6 Units into the skin 2 (two) times daily.  Patient taking differently: Inject 45 Units into the skin daily.   Latoya Wadley, MD Taking Active Self, Pharmacy Records, Family Member           Med Note (Walsh, Latoya Walsh   Tue Mar 02, 2023 10:51 AM) Pt is adamant she is using this medication and has been injecting 45 units once daily. Dispense report does not support this claim.   Insulin Pen Needle (PEN NEEDLES) 31G X 5 MM MISC [id number] Yes Use to inject insulin once daily Lurlean Walsh, Latoya Starr, NP Taking Active Self, Pharmacy Records, Family Member  Iron, Ferrous Sulfate, 325 (65 Fe) MG TABS [id number] No Take 325 mg by mouth daily.  Patient not taking: Reported on 03/02/2023   Walsh, Latoya Citrin, NP Not Taking Active Self, Pharmacy Records, Family Member  metoprolol succinate (TOPROL-XL) 50 MG 24 hr tablet [id number] Yes  TAKE ONE TABLET BY MOUTH EVERY EVENING Branch, [name], MD Taking Active Self, Pharmacy Records, Family Member  mirtazapine (REMERON) 15 MG tablet [id number] Yes TAKE ONE TABLET BY MOUTH EVERY EVENING Walsh, Latoya Citrin, NP Taking Active Self, Pharmacy Records, Family Member  rosuvastatin (CRESTOR) 10 MG tablet [id number] Yes TAKE 1 TABLET BY MOUTH EVERY MORNING Walsh, Latoya C, NP Taking Active   Sevelamer Carbonate (RENVELA PO) [id number] Yes Take 1 packet by mouth See admin instructions. 1 packet with each meal and with snacks [provider] Taking Active Self, Pharmacy Records, Family Member           Med Note (Walsh, Latoya C   Tue Mar 02, 2023 10:52 AM) Pt is unsure of the dose of her packets. No fill hx found.   sevelamer carbonate (RENVELA) 800 MG tablet [id number] Yes Take 800 mg by mouth 3 (three) times daily with meals. [provider] Taking Active Self, Pharmacy Records, Family Member           Med Note Normand Sloop, Jed Limerick   Thu Jan 01, 2022 10:12 AM)  sucroferric oxyhydroxide (VELPHORO) 500 MG chewable tablet [id number] Yes Chew 500 mg by mouth 3 (three) times daily with meals. [provider] Taking Active Self, Pharmacy Records, Family Member  Med List Note [name], [place] 40/10/27 2536):              Patient Active Problem List   Diagnosis Date Noted   Acute CVA (cerebrovascular accident) (HCC) 03/02/2023   Secondary hyperparathyroidism (HCC) 12/31/2022   ESRD on dialysis (HCC) 01/06/2021   CHF (congestive heart failure) (HCC) 06/06/2020   Leg swelling 04/28/2020   Hyperlipidemia LDL goal <100 04/28/2020   Left hand pain 03/10/2020   Anemia 02/19/2020   COVID-19 virus infection 12/26/2019   Depression, major, single episode, in partial remission (HCC) 01/25/2019   Headache 11/13/2018   Insomnia 09/25/2018   Screening for colorectal cancer 07/23/2016   Lumbar back pain with radiculopathy affecting right lower extremity 03/27/2015    Non compliance with medical treatment 07/22/2014   Chronic hepatitis C without hepatic coma (HCC) 12/04/2013   Allergic rhinitis 11/03/2012   Anxiety and depression 02/05/2011   Nicotine dependence 11/01/2010   Dermatitis 10/30/2010   Drug dependence, continuous abuse (HCC) 03/22/2010   Uncontrolled type 2 diabetes mellitus with hyperglycemia (HCC) 06/02/2007   Alcohol abuse 06/02/2007    Conditions to be addressed/monitored per PCP order:  DMII and Stroke  Care Plan : RN Care Manager Plan of Care  Updates made by Latoya Dach, RN since 03/22/2023 12:00 AM     Problem: Health Management needs related to DMII and HF      Long-Range Goal: Development of Plan of Care to address Health Management needs related to DMII and HF   Start Date: 07/10/2021  Expected End Date: 03/23/2023  Priority: High  Note:   Current Barriers:  Chronic Disease Management support and education needs related to CHF and DMII Ms. Cervoni was hospitalized on 03/01/23 for CVA. She has follow up with Neurology/Stroke Clinic on 04/13/23. She needs to reschedule missed PCP visit  RNCM Clinical Goal(s):  Patient will verbalize understanding of plan for management of CHF and DMII as evidenced by patient verbalization of self monitoring activities take all medications exactly as prescribed and will call provider for medication related questions as evidenced by documentation in EMR    attend all scheduled medical appointments: Dialysis on M/W/F, 03/30/23 with Vein and Vascular,  Endocrinology on 04/06/23, and 04/13/23 with Neurology  as evidenced by provider documentation          Interventions: Inter-disciplinary care team collaboration (see longitudinal plan of care) Evaluation of current treatment plan related to  self management and patient's adherence to plan as established by provider Provided therapeutic listening   Diabetes:  (Status: Goal on Track (progressing): YES.) Long Term Goal  Lab Results  Component  Value Date   HGBA1C 9.3 (H) 03/03/2023   @ Assessed patient's understanding of A1c goal: <8% Provided education to patient about basic DM disease process; Reviewed prescribed diet with patient diabetic; Counseled on importance of regular laboratory monitoring as prescribed;        Discussed plans with patient for ongoing care management follow up and provided patient with direct contact information for care management team;      Reviewed scheduled/upcoming provider appointments including:  03/30/23 with VVS, 04/06/23 with Endocrinology and 04/13/23 with Neurology ;         Review of patient status, including review of consultants reports, relevant laboratory and other test results, and medications  completed;       Assessed social determinant of health barriers;        Encouragement provided Reviewed medications  Discussed the benefits of exercise, advised patient to exercise every day at least 30 minutes Assisted with rescheduling missed PCP appointment-message sent to scheduler in PCP office Provided with [facility] medical transportation [phone] Discussed upcoming Eye exam with [facility] on 05/20/23 at 2pm   Patient Goals/Self-Care Activities: Take medications as prescribed   Attend all scheduled provider appointments Call provider office for new concerns or questions  use salt in moderation eat more whole grains, fruits and vegetables, lean meats and healthy fats dress right for the weather, hot or cold Complete requested lab work       Follow Up:  Patient agrees to Care Plan and Follow-up.  Plan: The Managed Medicaid care management team will reach out to the patient again over the next 30 days.  Date/time of next scheduled RN care management/care coordination outreach:  04/22/23 at 1:15pm  Estanislado Emms RN, BSN [place]  [facility] RN Care Coordinator [phone]

## 2023-03-23 DIAGNOSIS — Z992 Dependence on renal dialysis: Secondary | ICD-10-CM | POA: Diagnosis not present

## 2023-03-23 DIAGNOSIS — N186 End stage renal disease: Secondary | ICD-10-CM | POA: Diagnosis not present

## 2023-03-23 DIAGNOSIS — E1122 Type 2 diabetes mellitus with diabetic chronic kidney disease: Secondary | ICD-10-CM | POA: Diagnosis not present

## 2023-03-25 ENCOUNTER — Ambulatory Visit (INDEPENDENT_AMBULATORY_CARE_PROVIDER_SITE_OTHER): Payer: Medicaid Other | Admitting: Family

## 2023-03-25 ENCOUNTER — Encounter: Payer: Self-pay | Admitting: Family

## 2023-03-25 VITALS — BP 128/80 | HR 78 | Temp 97.2°F | Resp 20 | Ht 67.0 in | Wt 166.0 lb

## 2023-03-25 DIAGNOSIS — E785 Hyperlipidemia, unspecified: Secondary | ICD-10-CM | POA: Diagnosis not present

## 2023-03-25 DIAGNOSIS — N186 End stage renal disease: Secondary | ICD-10-CM

## 2023-03-25 DIAGNOSIS — H6122 Impacted cerumen, left ear: Secondary | ICD-10-CM | POA: Diagnosis not present

## 2023-03-25 DIAGNOSIS — R1310 Dysphagia, unspecified: Secondary | ICD-10-CM

## 2023-03-25 DIAGNOSIS — I5022 Chronic systolic (congestive) heart failure: Secondary | ICD-10-CM

## 2023-03-25 DIAGNOSIS — E1169 Type 2 diabetes mellitus with other specified complication: Secondary | ICD-10-CM

## 2023-03-25 DIAGNOSIS — B182 Chronic viral hepatitis C: Secondary | ICD-10-CM

## 2023-03-25 DIAGNOSIS — I120 Hypertensive chronic kidney disease with stage 5 chronic kidney disease or end stage renal disease: Secondary | ICD-10-CM | POA: Diagnosis not present

## 2023-03-25 DIAGNOSIS — F322 Major depressive disorder, single episode, severe without psychotic features: Secondary | ICD-10-CM

## 2023-03-25 DIAGNOSIS — F1920 Other psychoactive substance dependence, uncomplicated: Secondary | ICD-10-CM

## 2023-03-25 MED ORDER — DEBROX 6.5 % OT SOLN: 5.0000 [drp] | Freq: Two times a day (BID) | OTIC | 0 refills | Status: AC

## 2023-03-25 NOTE — Progress Notes (Signed)
 Provider: Roxan Plough FNP-C  Deepika Decatur, Roxan BROCKS, NP  Patient Care Team: Jodine Muchmore, Roxan BROCKS, NP as PCP - General (Family Medicine) Alvan Dorn FALCON, MD as PCP - Cardiology (Cardiology) Lucky Andrea LABOR, RN as Case Manager [facility], [place]  Extended Emergency Contact Information Primary Emergency Contact: Flippen,Shavon Mobile Phone: [phone] Relation: Daughter Secondary Emergency Contact: Fountain,Chandy Mobile Phone: [phone] Relation: Sister  Code Status:  Full Code  Goals of care: Advanced Directive information    03/10/2023    9:15 PM  Advanced Directives  Does Patient Have a Medical Advance Directive? No  Would patient like information on creating a medical advance directive? No - Patient declined     Chief Complaint  Patient presents with   Hospitalization Follow-up    Patient presents today for hospital follow-up. She was admitted into [facility] on 03/10/23-03/11/23 for acute non-recurrent maxillary sinusitis.    HPI:  Pt is a 63 y.o. female seen today for an acute visit for Hospital follow up 03/10/2023 for acute maxillary sinusitis after she presented to ED with facial pain and Temp 100.4.she had mild leukocytosis.she was treated with clindamycin  due to allergies to PCN.Has completed clindamycin .  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  She is here with her sister.states double vision has resolved.Has been taking Plavix  but states did not have a prescription for Asprin so has not  been taking it.Made aware that Asprin is over the counter.   Past Medical History:  Diagnosis Date   Allergic rhinitis    Anxiety    CHF (congestive heart failure) (HCC)    a. EF 30-35% by echo in 05/2020 b. EF at 45% in 09/2020   Chronic back pain    Chronic hepatitis C without hepatic coma (HCC)    COVID-19 virus infection 12/26/2019   Depression    Diabetes mellitus    Hepatitis C    Hypertension    Noncompliance    Poor appetite 07/17/2014   Substance abuse (HCC)    HX of drug use and alcohol use   Past Surgical History:  Procedure Laterality Date   APPENDECTOMY  2004   AV FISTULA PLACEMENT Left 11/26/2020   Procedure: LEFT ARM ARTERIOVENOUS (AV) FISTULA CREATION;  Surgeon: Oris Krystal FALCON, MD;  Location: [facility];  Service: Vascular;  Laterality: Left;   AV FISTULA PLACEMENT Left 06/03/2021   Procedure: INSERTION OF LEFT UPPER ARM ARTERIOVENOUS (AV) GORE-TEX GRAFT;  Surgeon: Oris Krystal FALCON, MD;  Location: [facility];  Service: Vascular;  Laterality: Left;   EXCHANGE OF A DIALYSIS CATHETER Right 02/24/2022   Procedure: EXCHANGE OF A DIALYSIS CATHETER;  Surgeon: Oris Krystal FALCON, MD;  Location: [facility];  Service: Vascular;  Laterality: Right;   IR FLUORO GUIDE CV LINE RIGHT  12/25/2020   IR US  GUIDE VASC ACCESS RIGHT  12/25/2020   LIGATION ARTERIOVENOUS GORTEX GRAFT Left 08/05/2021   Procedure: LIGATION OF LEFT ARM ARTERIOVENOUS GORTEX GRAFT;  Surgeon: Oris Krystal FALCON, MD;  Location: [facility];  Service: Vascular;  Laterality: Left;    Allergies  Allergen  Reactions   Penicillins Itching and Rash    Outpatient Encounter Medications as of 03/25/2023  Medication Sig   albuterol  (VENTOLIN  HFA) 108 (90 Base) MCG/ACT inhaler Inhale 2 puffs into the lungs every 4 (four) hours as needed for wheezing or shortness of breath.   aspirin  EC 81 MG tablet Take 1 tablet (81 mg total) by mouth daily. Swallow whole.   atorvastatin  (LIPITOR) 40 MG tablet Take 1 tablet (40 mg total) by mouth daily.   Blood Glucose  Monitoring Suppl (ONETOUCH VERIO FLEX SYSTEM) w/Device KIT USE TO TEST BLOOD SUGAR TWICE DAILY AS DIRECTED *REFILL REQUEST*   busPIRone  (BUSPAR ) 5 MG tablet Take 1 tablet (5 mg total) by mouth 2 (two) times daily.   clopidogrel  (PLAVIX ) 75 MG tablet Take 1 tablet (75 mg total) by mouth daily for 21 days.   COMFORT EZ PEN NEEDLES 32G X 4 MM MISC USE AS DIRECTED with insulin  injections   Continuous Glucose Sensor (FREESTYLE LIBRE 3 SENSOR) MISC 1 Device by Does not apply route continuous. Place 1 sensor on the skin every 14 days. Use to check glucose continuously   D3-1000 25 MCG (1000 UT) capsule Take 1,000 Units by mouth daily.   docusate sodium  (COLACE) 100 MG capsule Take 1 capsule (100 mg total) by mouth daily as needed for mild constipation.   insulin  glargine (LANTUS  SOLOSTAR) 100 UNIT/ML Solostar Pen Inject 38 Units into the skin at bedtime. (Patient taking differently: Inject 46 Units into the skin daily.)   insulin  lispro (HUMALOG  KWIKPEN) 100 UNIT/ML KwikPen Inject 6 Units into the skin 2 (two) times daily. (Patient taking differently: Inject 45 Units into the skin daily.)   Insulin  Pen Needle (PEN NEEDLES) 31G X 5 MM MISC Use to inject insulin  once daily   Iron , Ferrous Sulfate , 325 (65 Fe) MG TABS Take 325 mg by mouth daily.   metoprolol  succinate (TOPROL -XL) 50 MG 24 hr tablet TAKE ONE TABLET BY MOUTH EVERY EVENING   mirtazapine  (REMERON ) 15 MG tablet TAKE ONE TABLET BY MOUTH EVERY EVENING   rosuvastatin  (CRESTOR ) 10 MG tablet TAKE 1 TABLET BY MOUTH EVERY MORNING   Sevelamer  Carbonate (RENVELA  PO) Take 1 packet by mouth See admin instructions. 1 packet with each meal and with snacks   sevelamer  carbonate (RENVELA ) 800 MG tablet Take 800 mg by mouth 3 (three) times daily with meals.   sucroferric oxyhydroxide (VELPHORO) 500 MG chewable tablet Chew 500 mg by mouth 3 (three) times daily with meals.   No facility-administered encounter medications on file as of 03/25/2023.    Review of  Systems  Constitutional:  Negative for appetite change, chills, fatigue, fever and unexpected weight change.  HENT:  Negative for congestion, dental problem, ear discharge, ear pain, facial swelling, hearing loss, nosebleeds, postnasal drip, rhinorrhea, sinus pressure, sinus pain, sneezing, sore throat, tinnitus and trouble swallowing.   Eyes:  Negative for pain, discharge, redness, itching and visual disturbance.  Respiratory:  Negative for cough, chest tightness, shortness of breath and wheezing.   Cardiovascular:  Negative for chest pain, palpitations and leg swelling.  Gastrointestinal:  Negative for abdominal distention, abdominal pain, blood in stool, constipation, diarrhea, nausea and vomiting.  Endocrine: Negative for cold intolerance, heat intolerance, polydipsia, polyphagia and polyuria.  Genitourinary:  Negative for difficulty urinating, dysuria, flank pain, frequency and urgency.  Musculoskeletal:  Positive for gait problem. Negative for arthralgias, back pain, joint swelling, myalgias, neck pain and neck stiffness.  Skin:  Negative for color change, pallor, rash and wound.  Neurological:  Negative for dizziness, syncope, speech difficulty, weakness, light-headedness, numbness and headaches.  Hematological:  Does not bruise/bleed easily.  Psychiatric/Behavioral:  Negative for agitation, behavioral problems, confusion, hallucinations, self-injury, sleep disturbance and suicidal ideas. The patient is not nervous/anxious.     Immunization History  Administered Date(s) Administered   H1N1 04/04/2008   Hepb-cpg 02/07/2021, 03/14/2021, 04/09/2021, 06/04/2021   Influenza Split 02/05/2011, 12/02/2011   Influenza Whole 12/10/2006, 01/08/2009   Influenza, Quadrivalent, Recombinant, Inj, Pf 12/26/2021   Influenza,inj,Quad PF,6+ Mos 02/12/2014, 03/27/2015, 03/05/2020, 01/22/2021   PFIZER(Purple Top)SARS-COV-2 Vaccination 09/29/2019   Pneumococcal Conjugate-13 06/11/2014   Pneumococcal  Polysaccharide-23 12/05/2004, 10/30/2010, 01/09/2022   Td 08/09/2003   Pertinent  Health Maintenance Due  Topic Date Due   OPHTHALMOLOGY EXAM  09/17/2021   HEMOGLOBIN A1C  09/01/2023   FOOT EXAM  12/31/2023   MAMMOGRAM  07/13/2024   INFLUENZA VACCINE  Completed      01/20/2022    1:14 PM 05/05/2022    8:58 AM 08/18/2022    9:20 AM 12/31/2022   10:36 AM 02/23/2023    9:25 AM  Fall Risk  Falls in the past year?  0 0 0 0  Was there an injury with Fall?  0 0  0  Fall Risk Category Calculator  0 0  0  (RETIRED) Patient Fall Risk Level Low fall risk      Patient at Risk for Falls Due to  No Fall Risks No Fall Risks  No Fall Risks  Fall risk Follow up  Falls evaluation completed Falls evaluation completed  Falls evaluation completed   Functional Status Survey:    Vitals:   03/25/23 1056  BP: 128/80  Pulse: 78  Resp: 20  Temp: (!) 97.2 F (36.2 C)  SpO2: 99%  Weight: 166 lb (75.3 kg)  Height: 5' 7 (1.702 m)   Body mass index is 26 kg/m. Physical Exam Vitals reviewed.  Constitutional:      General: She is not in acute distress.    Appearance: Normal appearance. She is overweight. She is not ill-appearing or diaphoretic.  HENT:     Head: Normocephalic.     Right Ear: Tympanic membrane, ear canal and external ear normal. There is no impacted cerumen.     Left Ear: There is impacted cerumen.     Nose: Nose normal. No congestion or rhinorrhea.     Mouth/Throat:     Mouth: Mucous membranes are moist.     Pharynx: Oropharynx is clear. No oropharyngeal exudate or posterior oropharyngeal erythema.  Eyes:     General: No scleral icterus.       Right eye: No discharge.        Left eye: No discharge.     Extraocular Movements: Extraocular movements intact.     Conjunctiva/sclera: Conjunctivae normal.     Pupils: Pupils are equal, round, and reactive to light.  Neck:     Vascular: No carotid bruit.  Cardiovascular:     Rate and Rhythm: Normal rate and regular rhythm.      Pulses: Normal pulses.     Heart sounds: Normal heart sounds. No murmur heard.    No friction rub. No gallop.  Pulmonary:     Effort: Pulmonary effort is normal. No respiratory distress.     Breath sounds: Normal breath sounds. No wheezing, rhonchi or rales.  Chest:     Chest wall: No tenderness.  Abdominal:     General: Bowel sounds are normal. There is no distension.  Palpations: Abdomen is soft. There is no mass.     Tenderness: There is no abdominal tenderness. There is no right CVA tenderness, left CVA tenderness, guarding or rebound.  Musculoskeletal:        General: No swelling or tenderness. Normal range of motion.     Cervical back: Normal range of motion. No rigidity or tenderness.     Right lower leg: No edema.     Left lower leg: No edema.  Lymphadenopathy:     Cervical: No cervical adenopathy.  Skin:    General: Skin is warm and dry.     Coloration: Skin is not pale.     Findings: No bruising, erythema, lesion or rash.  Neurological:     Mental Status: She is alert and oriented to person, place, and time.     Cranial Nerves: No cranial nerve deficit.     Sensory: No sensory deficit.     Motor: No weakness.     Coordination: Coordination normal.     Gait: Gait abnormal.  Psychiatric:        Mood and Affect: Mood normal.        Speech: Speech normal.        Behavior: Behavior normal.        Thought Content: Thought content normal.        Judgment: Judgment normal.     Labs reviewed: Recent Labs    03/01/23 1639 03/03/23 0541 03/03/23 0544 03/10/23 2202  NA 132*  --  136 133*  K 4.1  --  4.7 4.0  CL 95*  --  102 93*  CO2 24  --  22 24  GLUCOSE 336*  --  84 261*  BUN 19  --  35* 21*  CREATININE 4.10*  --  7.91* 4.82*  CALCIUM  8.4*  --  8.9 9.3  PHOS  --  5.8* 5.8*  --    Recent Labs    04/30/22 0000 01/07/23 1439 02/11/23 0805 03/01/23 1639 03/03/23 0544  AST  --  9 15 18   --   ALT  --  10 15 20   --   ALKPHOS 96 88  --  84  --   BILITOT   --  0.4 0.5 0.4  --   PROT  --  7.4 8.0 7.4  --   ALBUMIN  --  4.1  --  3.4* 3.2*   Recent Labs    03/01/23 1639 03/03/23 0544 03/10/23 2202  WBC 8.2 7.8 12.1*  NEUTROABS 4.6 3.8 8.9*  HGB 11.3* 10.8* 12.5  HCT 33.2* 32.2* 37.8  MCV 92.2 92.0 92.4  PLT 155 157 219   Lab Results  Component Value Date   TSH 2.69 02/11/2023   Lab Results  Component Value Date   HGBA1C 9.3 (H) 03/03/2023   Lab Results  Component Value Date   CHOL 109 03/03/2023   HDL 22 (L) 03/03/2023   LDLCALC 54 03/03/2023   TRIG 163 (H) 03/03/2023   CHOLHDL 5.0 03/03/2023    Significant Diagnostic Results in last 30 days:  CT Head Wo Contrast Result Date: 03/10/2023 CLINICAL DATA:  Stroke, follow up; Sinus or nasal mass (Ped 0-17y) EXAM: CT HEAD WITHOUT CONTRAST CT MAXILLOFACIAL WITHOUT CONTRAST TECHNIQUE: Multidetector CT imaging of the head and maxillofacial structures were performed using the standard protocol without intravenous contrast. Multiplanar CT image reconstructions of the maxillofacial structures were also generated. RADIATION DOSE REDUCTION: This exam was performed according to the departmental dose-optimization program which includes  automated exposure control, adjustment of the mA and/or kV according to patient size and/or use of iterative reconstruction technique. COMPARISON:  MRI 03/02/2023 FINDINGS: CT HEAD FINDINGS Brain: No evidence of acute infarction, hemorrhage, hydrocephalus, extra-axial collection or mass lesion/mass effect. Extensive periventricular white matter changes are again identified, likely reflecting the sequela of small vessel ischemia, stable since prior examination. Vascular: No hyperdense vessel or unexpected calcification. Skull: Normal. Negative for fracture or focal lesion. Other: Mastoid air cells and middle ear cavities are clear. CT MAXILLOFACIAL FINDINGS Osseous: No fracture or mandibular dislocation. No destructive process. Orbits: Negative. No traumatic or  inflammatory finding. Sinuses: Extensive mucosal thickening within the right maxillary sinus again seen now with developing air-fluid level within this cavity. Remaining paranasal sinuses are clear. Soft tissues: Negative. IMPRESSION: 1. No acute intracranial abnormality. No calvarial fracture. 2. Stable extensive periventricular white matter changes, likely reflecting the sequela of small vessel ischemia. 3. Extensive mucosal thickening within the right maxillary sinus now with developing air-fluid level within this cavity. Correlate clinically for acute sinusitis. Electronically Signed   By: Dorethia Molt M.D.   On: 03/10/2023 23:26   CT Maxillofacial Wo Contrast Result Date: 03/10/2023 CLINICAL DATA:  Stroke, follow up; Sinus or nasal mass (Ped 0-17y) EXAM: CT HEAD WITHOUT CONTRAST CT MAXILLOFACIAL WITHOUT CONTRAST TECHNIQUE: Multidetector CT imaging of the head and maxillofacial structures were performed using the standard protocol without intravenous contrast. Multiplanar CT image reconstructions of the maxillofacial structures were also generated. RADIATION DOSE REDUCTION: This exam was performed according to the departmental dose-optimization program which includes automated exposure control, adjustment of the mA and/or kV according to patient size and/or use of iterative reconstruction technique. COMPARISON:  MRI 03/02/2023 FINDINGS: CT HEAD FINDINGS Brain: No evidence of acute infarction, hemorrhage, hydrocephalus, extra-axial collection or mass lesion/mass effect. Extensive periventricular white matter changes are again identified, likely reflecting the sequela of small vessel ischemia, stable since prior examination. Vascular: No hyperdense vessel or unexpected calcification. Skull: Normal. Negative for fracture or focal lesion. Other: Mastoid air cells and middle ear cavities are clear. CT MAXILLOFACIAL FINDINGS Osseous: No fracture or mandibular dislocation. No destructive process. Orbits:  Negative. No traumatic or inflammatory finding. Sinuses: Extensive mucosal thickening within the right maxillary sinus again seen now with developing air-fluid level within this cavity. Remaining paranasal sinuses are clear. Soft tissues: Negative. IMPRESSION: 1. No acute intracranial abnormality. No calvarial fracture. 2. Stable extensive periventricular white matter changes, likely reflecting the sequela of small vessel ischemia. 3. Extensive mucosal thickening within the right maxillary sinus now with developing air-fluid level within this cavity. Correlate clinically for acute sinusitis. Electronically Signed   By: Dorethia Molt M.D.   On: 03/10/2023 23:26   ECHOCARDIOGRAM COMPLETE Result Date: 03/03/2023    ECHOCARDIOGRAM REPORT   Patient Name:   FAYLINN SCHWENN Date of Exam: 03/03/2023 Medical Rec #:  [id number]        Height:       67.0 in Accession #:    [id number]       Weight:       163.0 lb Date of Birth:  1961/02/25       BSA:          1.854 m Patient Age:    61 years         BP:           121/61 mmHg Patient Gender: F                HR:  73 bpm. Exam Location:  Inpatient Procedure: 2D Echo, Cardiac Doppler and Color Doppler Indications:    Stroke  History:        Patient has prior history of Echocardiogram examinations, most                 recent 07/29/2021. Risk Factors:Diabetes and Former Smoker.  Sonographer:    Ozell Free Referring Phys: [id number] [facility]  Sonographer Comments: Image acquisition challenging due to patient behavioral factors. IMPRESSIONS  1. Left ventricular ejection fraction, by estimation, is 55 to 60%. The left ventricle has normal function. The left ventricle has no regional wall motion abnormalities. There is mild left ventricular hypertrophy. Left ventricular diastolic parameters are indeterminate.  2. Right ventricular systolic function is normal. The right ventricular size is normal.  3. A small pericardial effusion is present. There is no evidence of  cardiac tamponade.  4. The mitral valve is grossly normal. Trivial mitral valve regurgitation. No evidence of mitral stenosis.  5. The aortic valve is tricuspid. There is mild calcification of the aortic valve. Aortic valve regurgitation is not visualized. Aortic valve sclerosis is present, with no evidence of aortic valve stenosis. Comparison(s): No significant change from prior study. Conclusion(s)/Recommendation(s): No intracardiac source of embolism detected on this transthoracic study. Consider a transesophageal echocardiogram to exclude cardiac source of embolism if clinically indicated. FINDINGS  Left Ventricle: Left ventricular ejection fraction, by estimation, is 55 to 60%. The left ventricle has normal function. The left ventricle has no regional wall motion abnormalities. The left ventricular internal cavity size was normal in size. There is  mild left ventricular hypertrophy. Left ventricular diastolic parameters are indeterminate. Right Ventricle: The right ventricular size is normal. No increase in right ventricular wall thickness. Right ventricular systolic function is normal. Left Atrium: Left atrial size was normal in size. Right Atrium: Right atrial size was normal in size. Pericardium: A small pericardial effusion is present. There is no evidence of cardiac tamponade. Mitral Valve: The mitral valve is grossly normal. Trivial mitral valve regurgitation. No evidence of mitral valve stenosis. Tricuspid Valve: The tricuspid valve is grossly normal. Tricuspid valve regurgitation is mild . No evidence of tricuspid stenosis. Aortic Valve: The aortic valve is tricuspid. There is mild calcification of the aortic valve. Aortic valve regurgitation is not visualized. Aortic valve sclerosis is present, with no evidence of aortic valve stenosis. Aortic valve mean gradient measures 3.0 mmHg. Aortic valve peak gradient measures 5.1 mmHg. Aortic valve area, by VTI measures 1.84 cm. Pulmonic Valve: The pulmonic  valve was not well visualized. Pulmonic valve regurgitation is not visualized. No evidence of pulmonic stenosis. Aorta: The aortic root is normal in size and structure and the ascending aorta was not well visualized. Venous: The inferior vena cava was not well visualized. IAS/Shunts: The interatrial septum was not well visualized.  LEFT VENTRICLE PLAX 2D LVIDd:         4.10 cm   Diastology LVIDs:         2.90 cm   LV e' medial:    4.35 cm/s LV PW:         1.20 cm   LV E/e' medial:  17.6 LV IVS:        1.30 cm   LV e' lateral:   8.59 cm/s LVOT diam:     2.00 cm   LV E/e' lateral: 8.9 LV SV:         45 LV SV Index:   24 LVOT Area:  3.14 cm  RIGHT VENTRICLE            IVC RV Basal diam:  3.40 cm    IVC diam: 1.40 cm RV S prime:     9.03 cm/s TAPSE (M-mode): 2.2 cm LEFT ATRIUM             Index        RIGHT ATRIUM           Index LA diam:        3.20 cm 1.73 cm/m   RA Area:     11.60 cm LA Vol (A2C):   46.4 ml 25.03 ml/m  RA Volume:   24.50 ml  13.22 ml/m LA Vol (A4C):   24.2 ml 13.05 ml/m LA Biplane Vol: 33.2 ml 17.91 ml/m  AORTIC VALVE AV Area (Vmax):    1.94 cm AV Area (Vmean):   1.76 cm AV Area (VTI):     1.84 cm AV Vmax:           113.00 cm/s AV Vmean:          78.700 cm/s AV VTI:            0.243 m AV Peak Grad:      5.1 mmHg AV Mean Grad:      3.0 mmHg LVOT Vmax:         69.80 cm/s LVOT Vmean:        44.100 cm/s LVOT VTI:          0.142 m LVOT/AV VTI ratio: 0.58  AORTA Ao Root diam: 3.00 cm MITRAL VALVE               TRICUSPID VALVE MV Area (PHT): 4.21 cm    TR Peak grad:   25.2 mmHg MV Decel Time: 180 msec    TR Vmax:        251.00 cm/s MV E velocity: 76.70 cm/s MV A velocity: 90.00 cm/s  SHUNTS MV E/A ratio:  0.85        Systemic VTI:  0.14 m                            Systemic Diam: 2.00 cm Shelda Bruckner MD Electronically signed by Shelda Bruckner MD Signature Date/Time: 03/03/2023/12:22:09 PM    Final    CT ANGIO HEAD NECK W WO CM Result Date: 03/02/2023 CLINICAL DATA:   [place] EXAM: CT ANGIOGRAPHY HEAD AND NECK WITH AND WITHOUT CONTRAST TECHNIQUE: Multidetector CT imaging of the head and neck was performed using the standard protocol during bolus administration of intravenous contrast. Multiplanar CT image reconstructions and MIPs were obtained to evaluate the vascular anatomy. Carotid stenosis measurements (when applicable) are obtained utilizing NASCET criteria, using the distal internal carotid diameter as the denominator. RADIATION DOSE REDUCTION: This exam was performed according to the departmental dose-optimization program which includes automated exposure control, adjustment of the mA and/or kV according to patient size and/or use of iterative reconstruction technique. CONTRAST:  75mL OMNIPAQUE  IOHEXOL  350 MG/ML SOLN COMPARISON:  CT head 03/01/2023. FINDINGS: CT HEAD FINDINGS Brain: No evidence of acute infarction, hemorrhage, hydrocephalus, extra-axial collection or mass lesion/mass effect. Patchy white matter hypodensities are nonspecific but compatible with chronic microvascular ischemic change. Vascular: See below. Skull: No acute fracture. Sinuses/Orbits: Paranasal sinus mucosal thickening. No acute orbital findings. Other: No mastoid effusions Review of the MIP images confirms the above findings CTA NECK FINDINGS Aortic arch: The great vessel origins are patent  without significant stenosis. Atherosclerosis. Right carotid system: Atherosclerosis at the carotid bifurcation without greater than 50% stenosis. Left carotid system: Atherosclerosis at the carotid bifurcation without greater than 50% stenosis. Vertebral arteries: The vertebral arteries are patent without greater than 50% stenosis. Skeleton: No acute abnormality on limited assessment. Multilevel degenerative change. Other neck: No acute abnormality on limited assessment. Upper chest: Visualized lung apices are clear. Review of the MIP images confirms the above findings CTA HEAD FINDINGS Anterior circulation:  Bilateral intracranial ICAs are patent and tortuous. Severe right paraclinoid ICA stenosis. Bilateral MCAs and ACAs are patent without proximal hemodynamically significant stenosis. Posterior circulation: Bilateral intradural vertebral arteries, basilar artery and bilateral posterior cerebral arteries are patent. Severe left P2 PCA stenosis. Venous sinuses: As permitted by contrast timing, patent. Review of the MIP images confirms the above findings IMPRESSION: 1. No emergent large vessel occlusion. 2. 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 Electronically Signed   By: Gilmore GORMAN Molt M.D.   On: 03/02/2023 15:41   MR Brain W and Wo Contrast Result Date: 03/02/2023 CLINICAL DATA:  Neuro deficit, persistent/recurrent. CNS neoplasm suspected. EXAM: MRI HEAD WITHOUT AND WITH CONTRAST TECHNIQUE: Multiplanar, multiecho pulse sequences of the brain and surrounding structures were obtained without and with intravenous contrast. CONTRAST:  7mL GADAVIST  GADOBUTROL  1 MMOL/ML IV SOLN COMPARISON:  Head CT yesterday. FINDINGS: Brain: Diffusion imaging shows a focal acute infarction in the dorsal pons just to the right of midline. No other acute infarction. Chronic small-vessel ischemic changes are otherwise present diffusely throughout the pons and middle cerebellar peduncles. Cerebral hemispheres show confluent abnormal T2 and FLAIR signal throughout the deep and subcortical white matter. No large vessel territory stroke. No mass, hemorrhage, hydrocephalus or extra-axial collection. Small-vessel disease is favored in this patient with renal failure, diabetes and hypertension. Demyelinating disease is not excluded but much less likely. Vascular: Major vessels at the base of the brain show flow. Skull and upper cervical spine: Negative Sinuses/Orbits: Mucosal inflammatory changes of the paranasal sinuses on the right. No advanced sinusitis. Orbits negative. Other: None IMPRESSION: 1. 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 2. Extensive chronic small-vessel ischemic changes throughout the pons, middle cerebellar peduncles and cerebral hemispheric white matter. Small-vessel disease is favored in this patient with renal failure, diabetes and hypertension. Demyelinating disease is not excluded but much less likely. Electronically Signed   By: Oneil Officer M.D.   On: 03/02/2023 13:13   CT Head Wo Contrast Result Date: 03/01/2023 CLINICAL DATA:  Neuro deficit, acute, stroke suspected EXAM: CT HEAD WITHOUT CONTRAST TECHNIQUE: Contiguous axial images were obtained from the base of the skull through the vertex without intravenous contrast. RADIATION DOSE REDUCTION: This exam was performed according to the departmental dose-optimization program which includes automated exposure control, adjustment of the mA and/or kV according to patient size and/or use of iterative reconstruction technique. COMPARISON:  None Available. FINDINGS: Brain: No evidence of acute infarction, hemorrhage, hydrocephalus, extra-axial collection or mass lesion/mass effect. Vascular: No hyperdense vessel identified. Skull: No acute fracture. Sinuses/Orbits: Mostly clear sinuses.  No acute orbital findings. Other: No mastoid effusions. IMPRESSION: No evidence of acute intracranial abnormality. Electronically Signed   By: Gilmore GORMAN Molt M.D.   On: 03/01/2023 19:47   DG Chest 1 View Result Date: 03/01/2023 CLINICAL DATA:  Chest pain EXAM: CHEST  1 VIEW portable upright COMPARISON:  X-ray 02/24/2022 FINDINGS: Film is under penetrated. No consolidation, pneumothorax or effusion. No edema. Normal cardiopericardial silhouette.  Calcified aorta. Stable right IJ double-lumen catheter with the tip overlying the right atrium. IMPRESSION: No acute cardiopulmonary disease.  Right IJ double-lumen catheter. Electronically Signed   By: Ranell Bring M.D.   On: 03/01/2023 18:15    Assessment/Plan 1. Benign hypertension with ESRD  (end-stage renal disease) (HCC) (Primary) B/p well controlled  - continue dietary modification and exercise at least three times per week for 30 minutes. - continue on Metoprolol   - continue on ASA and Statin for cardiac event prophylaxis - CBC with Differential/Platelet - Basic metabolic panel  2. Type 2 diabetes mellitus with hyperlipidemia (HCC) Lab Results  Component Value Date   HGBA1C 9.3 (H) 03/03/2023  - continue on Humalog  and Lantus  managed by Endocrinologist  - ophthalmology referral ordered in the previous visit but unclear why she did not get seen at [facility].will resend order.   3. Hyperlipidemia LDL goal <100 LDL at goal  - continue on Atorvastatin    4. Impacted cerumen of left ear TM not visualized -  Instill debrox 6.5 otic solution 5 drops into left ear twice daily x 4 days then follow up for ear lavage.May apply cotton ball at bedtime to prevent drainage to pillow. - carbamide peroxide (DEBROX) 6.5 % OTIC solution; Place 5 drops into the left ear 2 (two) times daily for 4 days.  Dispense: 2 mL; Refill: 0  5. Dysphagia, unspecified type Reports difficulties swallowing sometimes  - Ambulatory referral to Speech Therapy  6. Moderately severe major depression (HCC) Mood stable  - continue on Remeron  and Buspar    7. Drug dependence, continuous abuse (HCC) No recent use   8. Chronic systolic heart failure (HCC) No signs of fluid overload  Managed with dialysis  - check weight at least three times per week and notify provider for any abrupt weight gain > 3 lbs  - Reduce salt intake in diet    9. Chronic hepatitis C without hepatic coma (HCC) Chronic  Asymptomatic.   Family/ staff Communication: Reviewed plan of care with patient and sister verbalized understanding   Labs/tests ordered:  - CBC with Differential/Platelet - Basic metabolic panel  Next Appointment: Return if symptoms worsen or fail to improve.   Roxan JAYSON Plough, NP

## 2023-03-25 NOTE — Patient Instructions (Signed)

## 2023-03-26 LAB — CBC WITH DIFFERENTIAL/PLATELET
Absolute Lymphocytes: 2735 {cells}/uL (ref 850–3900)
Absolute Monocytes: 482 {cells}/uL (ref 200–950)
Basophils Absolute: 103 {cells}/uL (ref 0–200)
Basophils Relative: 1.2 %
Eosinophils Absolute: 310 {cells}/uL (ref 15–500)
Eosinophils Relative: 3.6 %
HCT: 34.2 % — ABNORMAL LOW (ref 35.0–45.0)
Hemoglobin: 11.2 g/dL — ABNORMAL LOW (ref 11.7–15.5)
MCH: 31.4 pg (ref 27.0–33.0)
MCHC: 32.7 g/dL (ref 32.0–36.0)
MCV: 95.8 fL (ref 80.0–100.0)
MPV: 11.4 fL (ref 7.5–12.5)
Monocytes Relative: 5.6 %
Neutro Abs: 4971 {cells}/uL (ref 1500–7800)
Neutrophils Relative %: 57.8 %
Platelets: 263 10*3/uL (ref 140–400)
RBC: 3.57 10*6/uL — ABNORMAL LOW (ref 3.80–5.10)
RDW: 13.2 % (ref 11.0–15.0)
Total Lymphocyte: 31.8 %
WBC: 8.6 10*3/uL (ref 3.8–10.8)

## 2023-03-26 LAB — BASIC METABOLIC PANEL
BUN/Creatinine Ratio: 6 (calc) (ref 6–22)
BUN: 55 mg/dL — ABNORMAL HIGH (ref 7–25)
CO2: 20 mmol/L (ref 20–32)
Calcium: 8.9 mg/dL (ref 8.6–10.4)
Chloride: 103 mmol/L (ref 98–110)
Creat: 8.86 mg/dL — ABNORMAL HIGH (ref 0.50–1.05)
Glucose, Bld: 303 mg/dL — ABNORMAL HIGH (ref 65–139)
Potassium: 5.2 mmol/L (ref 3.5–5.3)
Sodium: 139 mmol/L (ref 135–146)

## 2023-03-29 NOTE — Progress Notes (Deleted)
 [facility] [place]  ASSESSMENT / PLAN: Latoya Walsh is a 63 y.o. *** handed female in need of permanent dialysis access. I reviewed options for dialysis in detail with the patient, including hemodialysis and peritoneal dialysis. I counseled the patient to ask their nephrologist about their candidacy for renal transplant. I counseled the patient that dialysis access requires surveillance and periodic maintenance. Plan to proceed with ***.    CHIEF COMPLAINT: ***  HISTORY OF PRESENT ILLNESS: Latoya Walsh is a 63 y.o. female ***  VASCULAR SURGICAL HISTORY: ***  VASCULAR RISK FACTORS: {FINDINGS; POSITIVE NEGATIVE:[phone]} history of stroke / transient ischemic attack. {FINDINGS; POSITIVE NEGATIVE:[phone]} history of coronary artery disease. *** history of PCI. *** history of CABG.  {FINDINGS; POSITIVE NEGATIVE:[phone]} history of diabetes mellitus. Last A1c ***. {FINDINGS; POSITIVE NEGATIVE:[phone]} history of smoking. *** actively smoking. {FINDINGS; POSITIVE NEGATIVE:[phone]} history of hypertension. *** drug regimen with *** control. {FINDINGS; POSITIVE NEGATIVE:[phone]} history of chronic kidney disease.  Last GFR ***. CKD {stage:30421363}. {FINDINGS; POSITIVE NEGATIVE:[phone]} history of chronic obstructive pulmonary disease, treated with ***.  FUNCTIONAL STATUS: ECOG performance status: {findings; ecog performance status:31780} Ambulatory status: {TNHAmbulation:25868}  CAREY 1 AND 3 YEAR INDEX Female (2pts) 75-79 or 80-84 (2pts) >84 (3pts) Dependence in toileting (1pt) Partial or full dependence in dressing (1pt) History of malignant neoplasm (2pts) CHF (3pts) COPD (1pts) CKD (3pts)  0-3 pts 6% 1 year mortality ; 21% 3 year mortality 4-5 pts 12% 1 year mortality ; 36% 3 year mortality >5 pts 21% 1 year mortality; 54% 3 year mortality   Past Medical History:  Diagnosis Date   Allergic rhinitis    Anxiety    CHF  (congestive heart failure) (HCC)    a. EF 30-35% by echo in 05/2020 b. EF at 45% in 09/2020   Chronic back pain    Chronic hepatitis C without hepatic coma (HCC)    COVID-19 virus infection 12/26/2019   Depression    Diabetes mellitus    Hepatitis C    Hypertension    Noncompliance    Poor appetite 07/17/2014   Substance abuse (HCC)    HX of drug use and alcohol use    Past Surgical History:  Procedure Laterality Date   APPENDECTOMY  2004   AV FISTULA PLACEMENT Left 11/26/2020   Procedure: LEFT ARM ARTERIOVENOUS (AV) FISTULA CREATION;  Surgeon: Oris Krystal FALCON, MD;  Location: [facility];  Service: Vascular;  Laterality: Left;   AV FISTULA PLACEMENT Left 06/03/2021   Procedure: INSERTION OF LEFT UPPER ARM ARTERIOVENOUS (AV) GORE-TEX GRAFT;  Surgeon: Oris Krystal FALCON, MD;  Location: [facility];  Service: Vascular;  Laterality: Left;   EXCHANGE OF A DIALYSIS CATHETER Right 02/24/2022   Procedure: EXCHANGE OF A DIALYSIS CATHETER;  Surgeon: Oris Krystal FALCON, MD;  Location: [facility];  Service: Vascular;  Laterality: Right;   IR FLUORO GUIDE CV LINE RIGHT  12/25/2020   IR US  GUIDE VASC ACCESS RIGHT  12/25/2020   LIGATION ARTERIOVENOUS GORTEX GRAFT Left 08/05/2021   Procedure: LIGATION OF LEFT ARM ARTERIOVENOUS GORTEX GRAFT;  Surgeon: Oris Krystal FALCON, MD;  Location: [facility];  Service: Vascular;  Laterality: Left;    Family History  Problem Relation Age of Onset   Diabetes Sister        Twin - AIDS     Social History   Socioeconomic History   Marital status: Single    Spouse name: Not on file   Number of children: 3   Years of  education: Not on file   Highest education level: Not on file  Occupational History   Occupation: Disabled  Tobacco Use   Smoking status: Light Smoker    Current packs/day: 0.25    Types: Cigarettes   Smokeless tobacco: Never  Vaping Use   Vaping status: Never Used  Substance and Sexual Activity   Alcohol use: Not Currently    Alcohol/week: 40.0 standard drinks of alcohol     Types: 40 Cans of beer per week    Comment: 2 16oz cans of beer a day   Drug use: Not Currently    Types: Marijuana, Cocaine    Comment: cant rememeber last time used.   Sexual activity: Yes    Birth control/protection: Condom  Other Topics Concern   Not on file  Social History Narrative   Not on file   Social Drivers of Health   Financial Resource Strain: Low Risk  (07/26/2020)   Overall Financial Resource Strain (CARDIA)    Difficulty of Paying Living Expenses: Not hard at all  Food Insecurity: Food Insecurity Present (03/02/2023)   Hunger Vital Sign    Worried About Running Out of Food in the Last Year: Sometimes true    Ran Out of Food in the Last Year: Sometimes true  Transportation Needs: Unmet Transportation Needs (03/02/2023)   PRAPARE - Administrator, Civil Service (Medical): Yes    Lack of Transportation (Non-Medical): Yes  Physical Activity: Inactive (12/19/2020)   Exercise Vital Sign    Days of Exercise per Week: 0 days    Minutes of Exercise per Session: 0 min  Stress: Stress Concern Present (08/21/2021)   [employer] of Occupational Health - Occupational Stress Questionnaire    Feeling of Stress : Rather much  Social Connections: Moderately Integrated (12/19/2020)   Social Connection and Isolation Panel [NHANES]    Frequency of Communication with Friends and Family: More than three times a week    Frequency of Social Gatherings with Friends and Family: Once a week    Attends Religious Services: More than 4 times per year    Active Member of [employer] or Organizations: Yes    Attends Banker Meetings: More than 4 times per year    Marital Status: Separated  Intimate Partner Violence: Not At Risk (03/02/2023)   Humiliation, Afraid, Rape, and Kick questionnaire    Fear of Current or Ex-Partner: No    Emotionally Abused: No    Physically Abused: No    Sexually Abused: No    Allergies  Allergen Reactions   Penicillins Itching and Rash     Current Outpatient Medications  Medication Sig Dispense Refill   albuterol  (VENTOLIN  HFA) 108 (90 Base) MCG/ACT inhaler Inhale 2 puffs into the lungs every 4 (four) hours as needed for wheezing or shortness of breath. 18 g 1   aspirin  EC 81 MG tablet Take 1 tablet (81 mg total) by mouth daily. Swallow whole. 30 tablet 12   atorvastatin  (LIPITOR) 40 MG tablet Take 1 tablet (40 mg total) by mouth daily. 30 tablet 0   Blood Glucose Monitoring Suppl (ONETOUCH VERIO FLEX SYSTEM) w/Device KIT USE TO TEST BLOOD SUGAR TWICE DAILY AS DIRECTED *REFILL REQUEST* 1 kit 10   busPIRone  (BUSPAR ) 5 MG tablet Take 1 tablet (5 mg total) by mouth 2 (two) times daily. 60 tablet 3   carbamide peroxide (DEBROX) 6.5 % OTIC solution Place 5 drops into the left ear 2 (two) times daily for 4 days. 2  mL 0   COMFORT EZ PEN NEEDLES 32G X 4 MM MISC USE AS DIRECTED with insulin  injections 100 each 1   Continuous Glucose Sensor (FREESTYLE LIBRE 3 SENSOR) MISC 1 Device by Does not apply route continuous. Place 1 sensor on the skin every 14 days. Use to check glucose continuously 6 each 3   D3-1000 25 MCG (1000 UT) capsule Take 1,000 Units by mouth daily.     docusate sodium  (COLACE) 100 MG capsule Take 1 capsule (100 mg total) by mouth daily as needed for mild constipation. 10 capsule 5   insulin  glargine (LANTUS  SOLOSTAR) 100 UNIT/ML Solostar Pen Inject 38 Units into the skin at bedtime. (Patient taking differently: Inject 46 Units into the skin daily.) 15 mL 2   insulin  lispro (HUMALOG  KWIKPEN) 100 UNIT/ML KwikPen Inject 6 Units into the skin 2 (two) times daily. (Patient taking differently: Inject 45 Units into the skin daily.) 15 mL 3   Insulin  Pen Needle (PEN NEEDLES) 31G X 5 MM MISC Use to inject insulin  once daily 90 each 3   Iron , Ferrous Sulfate , 325 (65 Fe) MG TABS Take 325 mg by mouth daily. 30 tablet 3   metoprolol  succinate (TOPROL -XL) 50 MG 24 hr tablet TAKE ONE TABLET BY MOUTH EVERY EVENING 30 tablet 9    mirtazapine  (REMERON ) 15 MG tablet TAKE ONE TABLET BY MOUTH EVERY EVENING 90 tablet 1   rosuvastatin  (CRESTOR ) 10 MG tablet TAKE 1 TABLET BY MOUTH EVERY MORNING 90 tablet 3   Sevelamer  Carbonate (RENVELA  PO) Take 1 packet by mouth See admin instructions. 1 packet with each meal and with snacks     sevelamer  carbonate (RENVELA ) 800 MG tablet Take 800 mg by mouth 3 (three) times daily with meals.     sucroferric oxyhydroxide (VELPHORO) 500 MG chewable tablet Chew 500 mg by mouth 3 (three) times daily with meals.     No current facility-administered medications for this visit.    PHYSICAL EXAM There were no vitals filed for this visit.  Constitutional: *** appearing. *** distress. Appears *** nourished.  Neurologic: CN ***. *** focal findings. *** sensory loss. Psychiatric: *** Mood and affect symmetric and appropriate. Eyes: *** No icterus. No conjunctival pallor. Ears, nose, throat: *** mucous membranes moist. Midline trachea.  Cardiac: *** rate and rhythm.  Respiratory: *** unlabored. Abdominal: *** soft, non-tender, non-distended.  Peripheral vascular: *** Extremity: *** edema. *** cyanosis. *** pallor.  Skin: *** gangrene. *** ulceration.  Lymphatic: *** Stemmer's sign. *** palpable lymphadenopathy.    PERTINENT LABORATORY AND RADIOLOGIC DATA  Most recent CBC    Latest Ref Rng & Units 03/25/2023   11:44 AM 03/10/2023   10:02 PM 03/03/2023    5:44 AM  CBC  WBC 3.8 - 10.8 Thousand/uL 8.6  12.1  7.8   Hemoglobin 11.7 - 15.5 g/dL 88.7  87.4  89.1   Hematocrit 35.0 - 45.0 % 34.2  37.8  32.2   Platelets 140 - 400 Thousand/uL 263  219  157      Most recent CMP    Latest Ref Rng & Units 03/25/2023   11:44 AM 03/10/2023   10:02 PM 03/03/2023    5:44 AM  CMP  Glucose 65 - 139 mg/dL 696  738  84   BUN 7 - 25 mg/dL 55  21  35   Creatinine 0.50 - 1.05 mg/dL 1.13  5.17  2.08   Sodium 135 - 146 mmol/L 139  133  136   Potassium 3.5 - 5.3  mmol/L 5.2  4.0  4.7   Chloride 98 - 110  mmol/L 103  93  102   CO2 20 - 32 mmol/L 20  24  22    Calcium  8.6 - 10.4 mg/dL 8.9  9.3  8.9     Renal function Estimated Creatinine Clearance: 7.2 mL/min (A) (by C-G formula based on SCr of 8.86 mg/dL (H)).  Hemoglobin A1C (no units)  Date Value  05/08/2022 11.9   Hgb A1c MFr Bld (%)  Date Value  03/03/2023 9.3 (H)    LDL Cholesterol (Calc)  Date Value Ref Range Status  02/11/2023 63 mg/dL (calc) Final    Comment:    Reference range: <100 . Desirable range <100 mg/dL for primary prevention;   <70 mg/dL for patients with CHD or diabetic patients  with > or = 2 CHD risk factors. SABRA LDL-C is now calculated using the Martin-Hopkins  calculation, which is a validated novel method providing  better accuracy than the Friedewald equation in the  estimation of LDL-C.  Gladis APPLETHWAITE et al. SANDREA. 7986;689(80): 2061-2068  (http://education.[facility].com/faq/FAQ164)    LDL Cholesterol  Date Value Ref Range Status  03/03/2023 54 0 - 99 mg/dL Final    Comment:           Total Cholesterol/HDL:CHD Risk Coronary Heart Disease Risk Table                     Men   Women  1/2 Average Risk   3.4   3.3  Average Risk       5.0   4.4  2 X Average Risk   9.6   7.1  3 X Average Risk  23.4   11.0        Use the calculated Patient Ratio above and the CHD Risk Table to determine the patient's CHD Risk.        ATP III CLASSIFICATION (LDL):  <100     mg/dL   Optimal  899-870  mg/dL   Near or Above                    Optimal  130-159  mg/dL   Borderline  839-810  mg/dL   High  >809     mg/dL   Very High Performed at [facility], [address] [place], [place] [address]      Vascular Imaging: ***  Debby SAILOR. Magda, MD [facility] Phone Number: [phone] 03/29/2023 10:26 AM   Total time spent on preparing this encounter including chart review, data review, collecting history, examining the patient, coordinating care for  this {tnhtimebilling:26202}  Portions of this report may have been transcribed using voice recognition software.  Every effort has been made to ensure accuracy; however, inadvertent computerized transcription errors may still be present.

## 2023-03-30 ENCOUNTER — Ambulatory Visit: Payer: Medicaid Other | Admitting: Vascular Surgery

## 2023-04-06 ENCOUNTER — Ambulatory Visit: Payer: Medicaid Other | Admitting: "Endocrinology

## 2023-04-09 ENCOUNTER — Other Ambulatory Visit: Payer: Self-pay | Admitting: Family

## 2023-04-09 DIAGNOSIS — N186 End stage renal disease: Secondary | ICD-10-CM | POA: Diagnosis not present

## 2023-04-09 DIAGNOSIS — F5101 Primary insomnia: Secondary | ICD-10-CM

## 2023-04-10 DIAGNOSIS — N186 End stage renal disease: Secondary | ICD-10-CM | POA: Diagnosis not present

## 2023-04-12 NOTE — Progress Notes (Unsigned)
PATIENT: Latoya Walsh DOB: 09-04-1960  REASON FOR VISIT: follow up HISTORY FROM: patient PRIMARY NEUROLOGIST: Dr. Pearlean Brownie  HISTORY OF PRESENT ILLNESS: Today 04/12/23  Latoya Walsh is a 63 y.o. 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 Returns today for follow-up.   ESRD Smoker sleep  HISTORY  Latoya Walsh is a 63 y.o. female with history of CHF, diabetes, hypertension, end-stage renal disease on dialysis presenting with diplopia, generalized weakness and hypertension.  He was found on MRI to have a dorsal pontine infarction on the right side.   Acute Ischemic Infarct:  right dorsal pontine stroke Etiology: Small vessel disease CT head No acute abnormality.  CTA head & neck no LVO, severe right paraclinoid ICA stenosis and severe left PCA P2 stenosis MRI acute ischemic infarct in the dorsal right pons 2D Echo EF 55 to 60%  LDL 54 HgbA1c 9.5 UDS neg VTE prophylaxis -subcutaneous heparin No antithrombotic prior to admission, now on aspirin 81 mg daily and clopidogrel 75 mg daily for 3 weeks and then aspirin alone. Therapy recommendations: none Disposition: Pending  REVIEW OF SYSTEMS: Out of a complete 14 system review of symptoms, the patient complains only of the following symptoms, and all other reviewed systems are negative.  ALLERGIES: Allergies  Allergen Reactions   Penicillins Itching and Rash    HOME MEDICATIONS: Outpatient Medications Prior to Visit  Medication Sig Dispense Refill   albuterol (VENTOLIN HFA) 108 (90 Base) MCG/ACT inhaler Inhale 2 puffs into the lungs every 4 (four) hours as needed for wheezing or shortness of breath. 18 g 1   aspirin EC 81 MG tablet Take 1 tablet (81 mg total) by mouth daily. Swallow whole. 30 tablet 12   atorvastatin (LIPITOR) 40 MG tablet Take 1 tablet (40 mg total) by mouth daily. 30 tablet 0   Blood Glucose Monitoring Suppl (ONETOUCH VERIO FLEX SYSTEM)  w/Device KIT USE TO TEST BLOOD SUGAR TWICE DAILY AS DIRECTED *REFILL REQUEST* 1 kit 10   busPIRone (BUSPAR) 5 MG tablet Take 1 tablet (5 mg total) by mouth 2 (two) times daily. 60 tablet 3   COMFORT EZ PEN NEEDLES 32G X 4 MM MISC USE AS DIRECTED with insulin injections 100 each 1   Continuous Glucose Sensor (FREESTYLE LIBRE 3 SENSOR) MISC 1 Device by Does not apply route continuous. Place 1 sensor on the skin every 14 days. Use to check glucose continuously 6 each 3   D3-1000 25 MCG (1000 UT) capsule Take 1,000 Units by mouth daily.     docusate sodium (COLACE) 100 MG capsule Take 1 capsule (100 mg total) by mouth daily as needed for mild constipation. 10 capsule 5   insulin glargine (LANTUS SOLOSTAR) 100 UNIT/ML Solostar Pen Inject 38 Units into the skin at bedtime. (Patient taking differently: Inject 46 Units into the skin daily.) 15 mL 2   insulin lispro (HUMALOG KWIKPEN) 100 UNIT/ML KwikPen Inject 6 Units into the skin 2 (two) times daily. (Patient taking differently: Inject 45 Units into the skin daily.) 15 mL 3   Insulin Pen Needle (PEN NEEDLES) 31G X 5 MM MISC Use to inject insulin once daily 90 each 3   Iron, Ferrous Sulfate, 325 (65 Fe) MG TABS Take 325 mg by mouth daily. 30 tablet 3   metoprolol succinate (TOPROL-XL) 50 MG 24 hr tablet TAKE ONE TABLET BY MOUTH EVERY EVENING 30 tablet 9   mirtazapine (REMERON) 15 MG tablet TAKE 1 TABLET  BY MOUTH EACH EVENING 30 tablet 10   rosuvastatin (CRESTOR) 10 MG tablet TAKE 1 TABLET BY MOUTH EVERY MORNING 90 tablet 3   Sevelamer Carbonate (RENVELA PO) Take 1 packet by mouth See admin instructions. 1 packet with each meal and with snacks     sevelamer carbonate (RENVELA) 800 MG tablet Take 800 mg by mouth 3 (three) times daily with meals.     sucroferric oxyhydroxide (VELPHORO) 500 MG chewable tablet Chew 500 mg by mouth 3 (three) times daily with meals.     No facility-administered medications prior to visit.    PAST MEDICAL HISTORY: Past Medical  History:  Diagnosis Date   Allergic rhinitis    Anxiety    CHF (congestive heart failure) (HCC)    a. EF 30-35% by echo in 05/2020 b. EF at 45% in 09/2020   Chronic back pain    Chronic hepatitis C without hepatic coma (HCC)    COVID-19 virus infection 12/26/2019   Depression    Diabetes mellitus    Hepatitis C    Hypertension    Noncompliance    Poor appetite 07/17/2014   Substance abuse (HCC)    HX of drug use and alcohol use    PAST SURGICAL HISTORY: Past Surgical History:  Procedure Laterality Date   APPENDECTOMY  2004   AV FISTULA PLACEMENT Left 11/26/2020   Procedure: LEFT ARM ARTERIOVENOUS (AV) FISTULA CREATION;  Surgeon: Larina Earthly, MD;  Location: [facility];  Service: Vascular;  Laterality: Left;   AV FISTULA PLACEMENT Left 06/03/2021   Procedure: INSERTION OF LEFT UPPER ARM ARTERIOVENOUS (AV) GORE-TEX GRAFT;  Surgeon: Larina Earthly, MD;  Location: [facility];  Service: Vascular;  Laterality: Left;   EXCHANGE OF A DIALYSIS CATHETER Right 02/24/2022   Procedure: EXCHANGE OF A DIALYSIS CATHETER;  Surgeon: Larina Earthly, MD;  Location: [facility];  Service: Vascular;  Laterality: Right;   IR FLUORO GUIDE CV LINE RIGHT  12/25/2020   IR US GUIDE VASC ACCESS RIGHT  12/25/2020   LIGATION ARTERIOVENOUS GORTEX GRAFT Left 08/05/2021   Procedure: LIGATION OF LEFT ARM ARTERIOVENOUS GORTEX GRAFT;  Surgeon: Larina Earthly, MD;  Location: [facility];  Service: Vascular;  Laterality: Left;    FAMILY HISTORY: Family History  Problem Relation Age of Onset   Diabetes Sister        Twin - AIDS     SOCIAL HISTORY: Social History   Socioeconomic History   Marital status: Single    Spouse name: Not on file   Number of children: 3   Years of education: Not on file   Highest education level: Not on file  Occupational History   Occupation: Disabled  Tobacco Use   Smoking status: Light Smoker    Current packs/day: 0.25    Types: Cigarettes   Smokeless tobacco: Never  Vaping Use   Vaping status:  Never Used  Substance and Sexual Activity   Alcohol use: Not Currently    Alcohol/week: 40.0 standard drinks of alcohol    Types: 40 Cans of beer per week    Comment: 2 16oz cans of beer a day   Drug use: Not Currently    Types: Marijuana, Cocaine    Comment: cant rememeber last time used.   Sexual activity: Yes    Birth control/protection: Condom  Other Topics Concern   Not on file  Social History Narrative   Not on file   Social Drivers of Health   Financial Resource Strain: Low Risk  (  07/26/2020)   Overall Financial Resource Strain (CARDIA)    Difficulty of Paying Living Expenses: Not hard at all  Food Insecurity: Food Insecurity Present (03/02/2023)   Hunger Vital Sign    Worried About Running Out of Food in the Last Year: Sometimes true    Ran Out of Food in the Last Year: Sometimes true  Transportation Needs: Unmet Transportation Needs (03/02/2023)   PRAPARE - Administrator, Civil Service (Medical): Yes    Lack of Transportation (Non-Medical): Yes  Physical Activity: Inactive (12/19/2020)   Exercise Vital Sign    Days of Exercise per Week: 0 days    Minutes of Exercise per Session: 0 min  Stress: Stress Concern Present (08/21/2021)   [employer] of Occupational Health - Occupational Stress Questionnaire    Feeling of Stress : Rather much  Social Connections: Moderately Integrated (12/19/2020)   Social Connection and Isolation Panel [NHANES]    Frequency of Communication with Friends and Family: More than three times a week    Frequency of Social Gatherings with Friends and Family: Once a week    Attends Religious Services: More than 4 times per year    Active Member of [employer] or Organizations: Yes    Attends Banker Meetings: More than 4 times per year    Marital Status: Separated  Intimate Partner Violence: Not At Risk (03/02/2023)   Humiliation, Afraid, Rape, and Kick questionnaire    Fear of Current or Ex-Partner: No    Emotionally  Abused: No    Physically Abused: No    Sexually Abused: No      PHYSICAL EXAM  There were no vitals filed for this visit. There is no height or weight on file to calculate BMI.  Generalized: Well developed, in no acute distress   Neurological examination  Mentation: Alert oriented to time, place, history taking. Follows all commands speech and language fluent Cranial nerve II-XII: Pupils were equal round reactive to light. Extraocular movements were full, visual field were full on confrontational test. Facial sensation and strength were normal. Uvula tongue midline. Head turning and shoulder shrug  were normal and symmetric. Motor: The motor testing reveals 5 over 5 strength of all 4 extremities. Good symmetric motor tone is noted throughout.  Sensory: Sensory testing is intact to soft touch on all 4 extremities. No evidence of extinction is noted.  Coordination: Cerebellar testing reveals good finger-nose-finger and heel-to-shin bilaterally.  Gait and station: Gait is normal. Tandem gait is normal. Romberg is negative. No drift is seen.  Reflexes: Deep tendon reflexes are symmetric and normal bilaterally.   DIAGNOSTIC DATA (LABS, IMAGING, TESTING) - I reviewed patient records, labs, notes, testing and imaging myself where available.  Lab Results  Component Value Date   WBC 8.6 03/25/2023   HGB 11.2 (L) 03/25/2023   HCT 34.2 (L) 03/25/2023   MCV 95.8 03/25/2023   PLT 263 03/25/2023      Component Value Date/Time   NA 139 03/25/2023 1144   NA 135 (A) 04/30/2022 0000   K 5.2 03/25/2023 1144   CL 103 03/25/2023 1144   CO2 20 03/25/2023 1144   GLUCOSE 303 (H) 03/25/2023 1144   BUN 55 (H) 03/25/2023 1144   BUN 50 (A) 05/08/2022 0000   CREATININE 8.86 (H) 03/25/2023 1144   CALCIUM 8.9 03/25/2023 1144   PROT 7.4 03/01/2023 1639   PROT 6.2 05/02/2020 1535   ALBUMIN 3.2 (L) 03/03/2023 0544   ALBUMIN 3.3 (L) 05/02/2020 1535  AST 18 03/01/2023 1639   ALT 20 03/01/2023 1639    ALT 65 (H) 12/04/2013 1449   ALKPHOS 84 03/01/2023 1639   BILITOT 0.4 03/01/2023 1639   BILITOT <0.2 05/02/2020 1535   BILITOT 0.3 12/04/2013 1449   GFRNONAA 10 (L) 03/10/2023 2202   GFRNONAA 39 (L) 03/28/2019 1056   GFRAA 31 (L) 05/02/2020 1535   GFRAA 45 (L) 03/28/2019 1056   Lab Results  Component Value Date   CHOL 109 03/03/2023   HDL 22 (L) 03/03/2023   LDLCALC 54 03/03/2023   TRIG 163 (H) 03/03/2023   CHOLHDL 5.0 03/03/2023   Lab Results  Component Value Date   HGBA1C 9.3 (H) 03/03/2023   No results found for: "VITAMINB12" Lab Results  Component Value Date   TSH 2.69 02/11/2023      ASSESSMENT AND PLAN 63 y.o. year old female  has a past medical history of Allergic rhinitis, Anxiety, CHF (congestive heart failure) (HCC), Chronic back pain, Chronic hepatitis C without hepatic coma (HCC), COVID-19 virus infection (12/26/2019), Depression, Diabetes mellitus, Hepatitis C, Hypertension, Noncompliance, Poor appetite (07/17/2014), and Substance abuse (HCC). here with:    Continue {anticoagulants:31417}  and ***  for secondary stroke prevention.  Discussed secondary stroke prevention measures and importance of close PCP follow up for aggressive stroke risk factor management. I have gone over the pathophysiology of stroke, warning signs and symptoms, risk factors and their management in some detail with instructions to go to the closest emergency room for symptoms of concern. HTN: BP goal <130/90.  Stable on *** per PCP HLD: LDL goal <70. Recent LDL ***.  DMII: A1c goal<7.0. Recent A1c ***.  Encouraged patient to monitor diet and encouraged exercise FU with our office ***     Butch Penny, MSN, NP-C 04/12/2023, 4:10 PM [facility] [address], [address] [place], [place] [address] [phone]

## 2023-04-13 ENCOUNTER — Ambulatory Visit (INDEPENDENT_AMBULATORY_CARE_PROVIDER_SITE_OTHER): Payer: Medicaid Other | Admitting: Adult Health

## 2023-04-13 VITALS — BP 146/83 | Ht 67.0 in | Wt 165.0 lb

## 2023-04-13 DIAGNOSIS — E1165 Type 2 diabetes mellitus with hyperglycemia: Secondary | ICD-10-CM | POA: Diagnosis not present

## 2023-04-13 DIAGNOSIS — Z794 Long term (current) use of insulin: Secondary | ICD-10-CM | POA: Diagnosis not present

## 2023-04-13 DIAGNOSIS — I639 Cerebral infarction, unspecified: Secondary | ICD-10-CM | POA: Diagnosis not present

## 2023-04-13 MED ORDER — ASPIRIN 81 MG PO TBEC: 81.0000 mg | DELAYED_RELEASE_TABLET | Freq: Every day | ORAL | 12 refills | Status: AC

## 2023-04-13 NOTE — Patient Instructions (Addendum)
Your Plan:  Continue ASA 81 mg daily. I sent prescription to mail order pharmacy  Blood pressure goal <130/90 Cholesterol LDL goal <70 Diabetes goal A1c <7 Monitor diet and try to exercise  Keep Regular Follow-up with PCP Talk with your sister about sleep evaluation- call if interested in proceeding.  Thank you for coming to see [place] at [facility]. I hope we have been able to provide you high quality care today.  You may receive a patient satisfaction survey over the next few weeks. We would appreciate your feedback and comments so that we may continue to improve ourselves and the health of our patients.

## 2023-04-14 NOTE — Progress Notes (Signed)
I agree with the above plan 

## 2023-04-20 DIAGNOSIS — N186 End stage renal disease: Secondary | ICD-10-CM | POA: Diagnosis not present

## 2023-04-22 ENCOUNTER — Other Ambulatory Visit: Payer: Self-pay | Admitting: *Deleted

## 2023-04-22 DIAGNOSIS — N186 End stage renal disease: Secondary | ICD-10-CM | POA: Diagnosis not present

## 2023-04-22 NOTE — Patient Instructions (Signed)
Visit Information  Latoya Walsh was given information about Medicaid Managed Care team care coordination services as a part of their [facility] Medicaid benefit. Latoya Walsh verbally consented to engagement with the [facility] Managed Care team.   If you are experiencing a medical emergency, please call 911 or report to your local emergency department or urgent care.   If you have a non-emergency medical problem during routine business hours, please contact your provider's office and ask to speak with a nurse.   For questions related to your [facility], please call: [phone] or visit the homepage here: [email or web address]  If you would like to schedule transportation through your [facility], please call the following number at least 2 days in advance of your appointment: [phone]   Rides for urgent appointments can also be made after hours by calling Member Services.  Call the Behavioral Health Crisis Line at [phone], at any time, 24 hours a day, 7 days a week. If you are in danger or need immediate medical attention call 911.  If you would like help to quit smoking, call 1-800-QUIT-NOW (878-143-2796) OR Espaol: 1-855-Djelo-Ya (1-324-401-0272) o para ms informacin haga clic aqu or Text READY to 536-644 to register via text  Latoya Walsh,    The patient verbalized understanding of instructions, educational materials, and care plan provided today and agreed to receive a mailed copy of patient instructions, educational materials, and care plan.   Telephone follow up appointment with Managed Medicaid care management team member scheduled for:05/18/23 at 3:15pm  Estanislado Emms RN, BSN [place]  Value-Based Care [facility] RN Care Manager [phone]   Following is a copy of your plan of care:  Care Plan : RN Care Manager  Plan of Care  Updates made by Latoya Dach, RN since 04/22/2023 12:00 AM     Problem: Health Management needs related to DMII and HF      Long-Range Goal: Development of Plan of Care to address Health Management needs related to DMII and HF   Start Date: 07/10/2021  Expected End Date: 03/23/2023  Priority: High  Note:   Current Barriers:  Chronic Disease Management support and education needs related to CHF and DMII Latoya Walsh   [facility] Clinical Goal(s):  Patient will verbalize understanding of plan for management of CHF and DMII as evidenced by patient verbalization of self monitoring activities take all medications exactly as prescribed and will call provider for medication related questions as evidenced by documentation in EMR    attend all scheduled medical appointments: Dialysis on M/W/F, 04/27/23 with Neuro [facility] and [facility]/20/23 at [facility]  as evidenced by provider documentation          Interventions: Inter-disciplinary care team collaboration (see longitudinal plan of care) Evaluation of current treatment plan related to  self management and patient's adherence to plan as established by provider Provided therapeutic listening   Stroke:  (Status:New goal.) Long Term Goal Reviewed Importance of taking all medications as prescribed Reviewed Importance of attending all scheduled provider appointments Advised to report any changes in symptoms or exercise tolerance Assessed social determinant of health barriers Discussed the importance of attending appointment with Neurology/Stroke Clinic on 04/13/23  Diabetes:  (Status: Goal on Track (progressing): YES.) Long Term Goal  Lab Results  Component Value Date   HGBA1C 9.3 (H) 03/03/2023   @ Assessed patient's understanding of A1c goal: <8% Provided education to patient about basic DM disease  process; Reviewed prescribed diet with patient diabetic; Counseled on importance of regular laboratory monitoring as prescribed;         Discussed plans with patient for ongoing care management follow up and provided patient with direct contact information for care management team;      Reviewed scheduled/upcoming provider appointments including:  03/30/23 with [facility], 04/06/23 with Endocrinology and 04/13/23 with Neurology ;         Review of patient status, including review of consultants reports, relevant laboratory and other test results, and medications completed;       Assessed social determinant of health barriers;        Encouragement provided Reviewed medications  Discussed the benefits of exercise, advised patient to exercise every day at least 30 minutes Assisted with rescheduling missed Endocrinology and [facility] visits-awaiting response Provided with [facility] medical transportation [phone] Discussed upcoming Eye exam with [facility] on 05/20/23 at 2pm-scheduled transportation Collaborated with [employer] [phone] regarding patient having group scheduled on T/TH Assisted with scheduling transportation on 04/27/23 with [facility], pick up time 10:05am and return home at 12:30pm; 05/20/23 pick up time at 8:15am going to group, pick up from group at 1:15pm going to [facility], return ride home at 3:30pm-all details discussed with patient   Patient Goals/Self-Care Activities: Take medications as prescribed   Attend all scheduled provider appointments Call provider office for new concerns or questions  use salt in moderation eat more whole grains, fruits and vegetables, lean meats and healthy fats dress right for the weather, hot or cold Complete requested lab work

## 2023-04-22 NOTE — Patient Outreach (Signed)
Medicaid Managed Care   Nurse Care Manager Note  04/22/2023 Name:  Latoya Walsh MRN:  [id number] DOB:  11/09/1960  Latoya Walsh is an 63 y.o. year old female who is a primary patient of [name], [name], NP.  The [facility] Managed Care Coordination team was consulted for assistance with:    DMII CVA  [name] was given information about Medicaid Managed Care Coordination team services today. [name] Patient agreed to services and verbal consent obtained.  Engaged with patient by telephone for follow up visit in response to provider referral for case management and/or care coordination services.   Patient is participating in a Managed Medicaid Plan:  Yes  Assessments/Interventions:  Review of past medical history, allergies, medications, health status, including review of consultants reports, laboratory and other test data, was performed as part of comprehensive evaluation and provision of chronic care management services.  SDOH (Social Drivers of Health) assessments and interventions performed: SDOH Interventions    Flowsheet Row Office Visit from 02/23/2023 in [facility] Patient Outreach Telephone from 02/16/2023 in [place] POPULATION HEALTH DEPARTMENT Patient Outreach Telephone from 01/28/2023 in [place] POPULATION HEALTH DEPARTMENT Patient Outreach Telephone from 12/30/2022 in [place] POPULATION HEALTH DEPARTMENT Patient Outreach Telephone from 09/30/2022 in [place] POPULATION HEALTH DEPARTMENT Patient Outreach Telephone from 04/28/2022 in [place] POPULATION HEALTH DEPARTMENT  SDOH Interventions        Food Insecurity Interventions -- -- -- Intervention Not Indicated Intervention Not Indicated Intervention Not Indicated  Housing Interventions -- -- -- -- Intervention Not Indicated Intervention Not Indicated  Transportation Interventions -- Payor Benefit  [facility] assisted with arranging transportation with [facility] medical  transportation] Payor Benefit  [RNCM provided with [facility] medical [phone]] Payor Benefit -- Payor Benefit  Utilities Interventions -- -- -- -- -- Intervention Not Indicated  Depression Interventions/Treatment  Currently on Treatment -- -- -- -- --       Care Plan  Allergies  Allergen Reactions   Penicillins Itching and Rash    Medications Reviewed Today     Reviewed by [name], RN (Registered Nurse) on 04/22/23 at 1345  Med List Status: <None>   Medication Order Taking? Sig Documenting Provider Last Dose Status Informant  albuterol (VENTOLIN HFA) 108 (90 Base) MCG/ACT inhaler [id number] Yes Inhale 2 puffs into the lungs every 4 (four) hours as needed for wheezing or shortness of breath. Erick Blinks, DO Taking Active Self, Pharmacy Records, Family Member           Med Note (CRUTHIS, CHLOE C   Tue Mar 02, 2023 10:50 AM) Pt is unsure of last dose.   amLODipine (NORVASC) 10 MG tablet [id number] Yes Take 10 mg by mouth every morning. [provider] Taking Active   aspirin EC 81 MG tablet [id number] Yes Take 1 tablet (81 mg total) by mouth daily. Swallow whole. Butch Penny, NP Taking Active   Blood Glucose Monitoring Suppl (ONETOUCH VERIO FLEX SYSTEM) w/Device Andria Rhein [id number] Yes USE TO TEST BLOOD SUGAR TWICE DAILY AS DIRECTED *REFILL REQUEST* [name], [name], NP Taking Active Self, Pharmacy Records, Family Member  busPIRone (BUSPAR) 5 MG tablet [id number] No Take 1 tablet (5 mg total) by mouth 2 (two) times daily.  Patient not taking: Reported on 04/13/2023   [name], Donalee Citrin, NP Not Taking Active Self, Pharmacy Records, Family Member  COMFORT EZ PEN NEEDLES 32G X 4 MM MISC [id number] No USE AS DIRECTED with insulin injections  Patient not taking: No sig reported   [name], NP Not Taking Active Self, Pharmacy Records, Family Member  Continuous Glucose Sensor (FREESTYLE LIBRE 3 SENSOR) [place] [id number] No 1 Device by Does not apply route  continuous. Place 1 sensor on the skin every 14 days. Use to check glucose continuously  Patient not taking: Reported on 04/13/2023   Altamese Merino, MD Not Taking Active Self, Pharmacy Records, Family Member           Med Note Ardelia Mems, [place] A   Thu Jan 28, 2023  9:09 AM) Has not picked up from the pharmacy  D3-1000 25 MCG (1000 UT) capsule [id number] Yes Take 1,000 Units by mouth daily. [provider] Taking Active Self, Pharmacy Records, Family Member  docusate sodium (COLACE) 100 MG capsule [id number] No Take 1 capsule (100 mg total) by mouth daily as needed for mild constipation.  Patient not taking: Reported on 04/13/2023   [name], Donalee Citrin, NP Not Taking Active Self, Pharmacy Records, Family Member  insulin glargine (LANTUS SOLOSTAR) 100 UNIT/ML Solostar Pen [id number] Yes Inject 38 Units into the skin at bedtime. [name], Donalee Citrin, NP Taking Active Self, Pharmacy Records, Family Member           Med Note (CRUTHIS, CHLOE C   Tue Mar 02, 2023 10:32 AM)    insulin lispro (HUMALOG KWIKPEN) 100 UNIT/ML KwikPen [id number] Yes Inject 6 Units into the skin 2 (two) times daily. Altamese Cary, MD Taking Active Self, Pharmacy Records, Family Member           Med Note (CRUTHIS, Marcy Siren   Tue Mar 02, 2023 10:51 AM) Pt is adamant she is using this medication and has been injecting 45 units once daily. Dispense report does not support this claim.   Insulin Pen Needle (PEN NEEDLES) 31G X 5 MM MISC [id number] Yes Use to inject insulin once daily Lurlean Leyden, Freddi Starr, NP Taking Active Self, Pharmacy Records, Family Member  Iron, Ferrous Sulfate, 325 (65 Fe) MG TABS [id number] No Take 325 mg by mouth daily.  Patient not taking: Reported on 04/13/2023   [name], Donalee Citrin, NP Not Taking Active Self, Pharmacy Records, Family Member  losartan (COZAAR) 50 MG tablet [id number] Yes Take 50 mg by mouth daily. [provider] Taking Active   metoprolol succinate (TOPROL-XL) 50 MG 24 hr tablet [id number] Yes TAKE  ONE TABLET BY MOUTH EVERY EVENING Branch, [name], MD Taking Active Self, Pharmacy Records, Family Member  mirtazapine (REMERON) 15 MG tablet [id number] Yes TAKE 1 TABLET BY MOUTH EACH EVENING [name], [name], NP Taking Active   rosuvastatin (CRESTOR) 10 MG tablet [id number] Yes TAKE 1 TABLET BY MOUTH EVERY MORNING [name], [name], NP Taking Active   Sevelamer Carbonate (RENVELA PO) [id number] Yes Take 1 packet by mouth See admin instructions. 1 packet with each meal and with snacks [provider] Taking Active Self, Pharmacy Records, Family Member           Med Note (CRUTHIS, CHLOE C   Tue Mar 02, 2023 10:52 AM) Pt is unsure of the dose of her packets. No fill hx found.   sevelamer carbonate (RENVELA) 800 MG tablet [id number] Yes Take 800 mg by mouth 3 (three) times daily with meals. [provider] Taking Active Self, Pharmacy Records, Family Member           Med Note Normand Sloop, Jed Limerick   Thu Jan 01, 2022 10:12 AM)    sucroferric oxyhydroxide (VELPHORO) 500 MG chewable  tablet [id number] Yes Chew 500 mg by mouth 3 (three) times daily with meals. [provider] Taking Active Self, Pharmacy Records, Family Member  Med List Note [name], [place] 11[birth year] 8295):              Patient Active Problem List   Diagnosis Date Noted   Moderately severe major depression (HCC) 03/25/2023   Acute CVA (cerebrovascular accident) (HCC) 03/02/2023   Secondary hyperparathyroidism (HCC) 12/31/2022   ESRD on dialysis (HCC) 01/06/2021   CHF (congestive heart failure) (HCC) 06/06/2020   Leg swelling 04/28/2020   Hyperlipidemia LDL goal <100 04/28/2020   Left hand pain 03/10/2020   Anemia 02/19/2020   COVID-19 virus infection 12/26/2019   Depression, major, single episode, in partial remission (HCC) 01/25/2019   Headache 11/13/2018   Insomnia 09/25/2018   Screening for colorectal cancer 07/23/2016   Lumbar back pain with radiculopathy affecting right lower extremity  03/27/2015   Non compliance with medical treatment 07/22/2014   Chronic hepatitis C without hepatic coma (HCC) 12/04/2013   Allergic rhinitis 11/03/2012   Anxiety and depression 02/05/2011   Nicotine dependence 11/01/2010   Dermatitis 10/30/2010   Drug dependence, continuous abuse (HCC) 03/22/2010   Uncontrolled type 2 diabetes mellitus with hyperglycemia (HCC) 06/02/2007   Alcohol abuse 06/02/2007    Conditions to be addressed/monitored per PCP order:  DMII and Stroke  Care Plan : RN Care Manager Plan of Care  Updates made by [name], RN since 04/22/2023 12:00 AM     Problem: Health Management needs related to DMII and HF      Long-Range Goal: Development of Plan of Care to address Health Management needs related to DMII and HF   Start Date: 07/10/2021  Expected End Date: 03/23/2023  Priority: High  Note:   Current Barriers:  Chronic Disease Management support and education needs related to CHF and DMII [name]   [facility] Clinical Goal(s):  Patient will verbalize understanding of plan for management of CHF and DMII as evidenced by patient verbalization of self monitoring activities take all medications exactly as prescribed and will call provider for medication related questions as evidenced by documentation in EMR    attend all scheduled medical appointments: Dialysis on M/W/F, 04/27/23 with [facility] and [facility]/23 [facility]  as evidenced by provider documentation          Interventions: Inter-disciplinary care team collaboration (see longitudinal plan of care) Evaluation of current treatment plan related to  self management and patient's adherence to plan as established by provider Provided therapeutic listening   Stroke:  (Status:New goal.) Long Term Goal Reviewed Importance of taking all medications as prescribed Reviewed Importance of attending all scheduled provider appointments Advised to report any changes in symptoms or exercise tolerance Assessed  social determinant of health barriers Discussed the importance of attending appointment with Neurology/Stroke Clinic on 04/13/23  Diabetes:  (Status: Goal on Track (progressing): YES.) Long Term Goal  Lab Results  Component Value Date   HGBA1C 9.3 (H) 03/03/2023   @ Assessed patient's understanding of A1c goal: <8% Provided education to patient about basic DM disease process; Reviewed prescribed diet with patient diabetic; Counseled on importance of regular laboratory monitoring as prescribed;        Discussed plans with patient for ongoing care management follow up and provided patient with direct contact information for care management team;      Reviewed scheduled/upcoming provider appointments including:  03/30/23 with [facility], 04/06/23 with Endocrinology and  04/13/23 with Neurology ;         Review of patient status, including review of consultants reports, relevant laboratory and other test results, and medications completed;       Assessed social determinant of health barriers;        Encouragement provided Reviewed medications  Discussed the benefits of exercise, advised patient to exercise every day at least 30 minutes Assisted with rescheduling missed Endocrinology and [facility] visits-awaiting response Provided with [facility] medical transportation [phone] Discussed upcoming [facility] with [facility] on 05/20/23 at 2pm-scheduled transportation Collaborated with [employer] [phone] regarding patient having group scheduled on T/TH Assisted with scheduling transportation on 04/27/23 with [facility], pick up time 10:05am and return home at 12:30pm; 05/20/23 pick up time at 8:15am going to group, pick up from group at 1:15pm going to [facility], return ride home at 3:30pm-all details discussed with patient   Patient Goals/Self-Care Activities: Take medications as prescribed   Attend all scheduled provider appointments Call provider office for new concerns or questions   use salt in moderation eat more whole grains, fruits and vegetables, lean meats and healthy fats dress right for the weather, hot or cold Complete requested lab work       Follow Up:  Patient agrees to Care Plan and Follow-up.  Plan: The Managed Medicaid care management team will reach out to the patient again over the next 30 days.  Date/time of next scheduled RN care management/care coordination outreach:  05/18/23 at 3:15pm  Estanislado Emms RN, BSN [place]  [facility] RN Care Manager [phone]

## 2023-04-23 DIAGNOSIS — N186 End stage renal disease: Secondary | ICD-10-CM | POA: Diagnosis not present

## 2023-04-23 DIAGNOSIS — Z992 Dependence on renal dialysis: Secondary | ICD-10-CM | POA: Diagnosis not present

## 2023-04-23 DIAGNOSIS — E1122 Type 2 diabetes mellitus with diabetic chronic kidney disease: Secondary | ICD-10-CM | POA: Diagnosis not present

## 2023-04-24 DIAGNOSIS — N186 End stage renal disease: Secondary | ICD-10-CM | POA: Diagnosis not present

## 2023-04-27 ENCOUNTER — Ambulatory Visit: Payer: Medicaid Other | Attending: Family | Admitting: Speech Pathology

## 2023-04-27 DIAGNOSIS — N186 End stage renal disease: Secondary | ICD-10-CM | POA: Diagnosis not present

## 2023-04-27 NOTE — Therapy (Deleted)
 OUTPATIENT SPEECH LANGUAGE PATHOLOGY SWALLOW EVALUATION   Patient Name: Latoya Walsh MRN: [id number] DOB:Sep 06, 1960, 63 y.o., female Today's Date: 04/27/2023  PCP: PIERRETTE REFERRING PROVIDER: ***  END OF SESSION:   Past Medical History:  Diagnosis Date   Allergic rhinitis    Anxiety    CHF (congestive heart failure) (HCC)    a. EF 30-35% by echo in 05/2020 b. EF at 45% in 09/2020   Chronic back pain    Chronic hepatitis C without hepatic coma (HCC)    COVID-19 virus infection 12/26/2019   Depression    Diabetes mellitus    Hepatitis C    Hypertension    Noncompliance    Poor appetite 07/17/2014   Substance abuse (HCC)    HX of drug use and alcohol use   Past Surgical History:  Procedure Laterality Date   APPENDECTOMY  2004   AV FISTULA PLACEMENT Left 11/26/2020   Procedure: LEFT ARM ARTERIOVENOUS (AV) FISTULA CREATION;  Surgeon: Oris Krystal FALCON, MD;  Location: [facility];  Service: Vascular;  Laterality: Left;   AV FISTULA PLACEMENT Left 06/03/2021   Procedure: INSERTION OF LEFT UPPER ARM ARTERIOVENOUS (AV) GORE-TEX GRAFT;  Surgeon: Oris Krystal FALCON, MD;  Location: [facility];  Service: Vascular;  Laterality: Left;   EXCHANGE OF A DIALYSIS CATHETER Right 02/24/2022   Procedure: EXCHANGE OF A DIALYSIS CATHETER;  Surgeon: Oris Krystal FALCON, MD;  Location: [facility];  Service: Vascular;  Laterality: Right;   IR FLUORO GUIDE CV LINE RIGHT  12/25/2020   IR US  GUIDE VASC ACCESS RIGHT  12/25/2020   LIGATION ARTERIOVENOUS GORTEX GRAFT Left 08/05/2021   Procedure: LIGATION OF LEFT ARM ARTERIOVENOUS GORTEX GRAFT;  Surgeon: Oris Krystal FALCON, MD;  Location: [facility];  Service: Vascular;  Laterality: Left;   Patient Active Problem List   Diagnosis Date Noted   Moderately severe major depression (HCC) 03/25/2023   Acute CVA (cerebrovascular accident) (HCC) 03/02/2023   Secondary hyperparathyroidism (HCC) 12/31/2022   ESRD on dialysis (HCC) 01/06/2021   CHF (congestive heart failure) (HCC) 06/06/2020   Leg  swelling 04/28/2020   Hyperlipidemia LDL goal <100 04/28/2020   Left hand pain 03/10/2020   Anemia 02/19/2020   COVID-19 virus infection 12/26/2019   Depression, major, single episode, in partial remission (HCC) 01/25/2019   Headache 11/13/2018   Insomnia 09/25/2018   Screening for colorectal cancer 07/23/2016   Lumbar back pain with radiculopathy affecting right lower extremity 03/27/2015   Non compliance with medical treatment 07/22/2014   Chronic hepatitis C without hepatic coma (HCC) 12/04/2013   Allergic rhinitis 11/03/2012   Anxiety and depression 02/05/2011   Nicotine  dependence 11/01/2010   Dermatitis 10/30/2010   Drug dependence, continuous abuse (HCC) 03/22/2010   Uncontrolled type 2 diabetes mellitus with hyperglycemia (HCC) 06/02/2007   Alcohol abuse 06/02/2007    ONSET DATE: 03/25/23 referral date   REFERRING DIAG: R13.10 (ICD-10-CM) - Dysphagia, unspecified type   THERAPY DIAG:  No diagnosis found.  Rationale for Evaluation and Treatment: Rehabilitation  SUBJECTIVE:   SUBJECTIVE STATEMENT: *** Pt accompanied by: {accompnied:27141}  PERTINENT HISTORY: ED on 03/01/2023 - 03/04/2023 for double vision.Had CT of the head which was negative.MRI brain showed focal acute infarction in the dorsal pons just to the right of midline ,demyelinating disease was not excluded but was thought to be much less likely.Neurologist was consulted thought CVA due to small vessel disease.   PAIN:  Are you having pain? {OPRCPAIN:27236}  FALLS: Has patient fallen in last 6 months?  {SLPFALLS:25679}  LIVING ENVIRONMENT: Lives with: {OPRC lives with:25569::lives with their family} Lives in: {Lives in:25570}  PLOF:  Level of assistance: {DOEEONQ:74326} Employment: {SLPemployment:25674}  PATIENT GOALS: ***  OBJECTIVE:  Note: Objective measures were completed at Evaluation unless otherwise noted. OBJECTIVE:   DIAGNOSTIC FINDINGS: ***  INSTRUMENTAL SWALLOW STUDY FINDINGS ({mbss  fees:29767}) *** Objective swallow impairments: *** Objective recommended compensations: ***  COGNITION: Overall cognitive status: {cognition:24006} Areas of impairment:  {cognitiveimpairmentslp:27409} Functional deficits: ***  SUBJECTIVE DYSPHAGIA REPORTS:  Date of onset: *** Reported symptoms: {dysphagia symptoms:29766}  Current diet: {slpdiet:27196}  Co-morbid voice changes: {yes/no:20286}  FACTORS WHICH MAY INCREASE RISK OF ADVERSE EVENT IN PRESENCE OF ASPIRATION:  General health: {CSE general health:29764}  Risk factors: {CSE risk factors:29763}    ORAL MOTOR EXAMINATION: Overall status: {OMESLP2:27645} Comments: ***  CLINICAL SWALLOW ASSESSMENT:   Dentition: {CSE dentition:29769} Vocal quality at baseline: {VQL:27192} Patient directly observed with POs: {POobserved:27199} Feeding: {slp feeding:27200} Liquids provided by: {SLPliquids:27201} [facility] Swallow Protocol: {Pass/Fail (Optional):210140017} Oral phase signs and symptoms: {SLPoralphase:27202} Pharyngeal phase signs and symptoms: {SLPpharyngealphase:27203}  PATIENT REPORTED OUTCOME MEASURES (PROM): {SLPPROM:27095}                                                                                                                             TREATMENT DATE: ***   PATIENT EDUCATION: Education details: *** Person educated: {Person educated:25204} Education method: {Education Method:25205} Education comprehension: {Education Comprehension:25206}   ASSESSMENT:  CLINICAL IMPRESSION: Patient is a 63 y.o. F who was seen today for clinical swallow evaluation d/t c/o ***.   OBJECTIVE IMPAIRMENTS: include {SLPOBJIMP:27107}. These impairments are limiting patient from {SLPLIMIT:27108}. Factors affecting potential to achieve goals and functional outcome are {SLP potential:25450}. Patient will benefit from skilled SLP services to address above impairments and improve overall function.  REHAB POTENTIAL:  {rehabpotential:25112}   GOALS: Goals reviewed with patient? {yes/no:20286}  SHORT TERM GOALS: Target date: ***  *** Baseline: Goal status: INITIAL  2.  *** Baseline:  Goal status: INITIAL  3.  *** Baseline:  Goal status: INITIAL  4.  *** Baseline:  Goal status: INITIAL  5.  *** Baseline:  Goal status: INITIAL  6.  *** Baseline:  Goal status: INITIAL  LONG TERM GOALS: Target date: ***  *** Baseline:  Goal status: INITIAL  2.  *** Baseline:  Goal status: INITIAL  3.  *** Baseline:  Goal status: INITIAL  4.  *** Baseline:  Goal status: INITIAL  5.  *** Baseline:  Goal status: INITIAL  6.  *** Baseline:  Goal status: INITIAL  PLAN:  SLP FREQUENCY: {rehab frequency:25116}  SLP DURATION: {rehab duration:25117}  PLANNED INTERVENTIONS: {SLP treatment/interventions:25449}    Harlene LITTIE Ned, CCC-SLP 04/27/2023, 8:11 AM

## 2023-05-08 DIAGNOSIS — N186 End stage renal disease: Secondary | ICD-10-CM | POA: Diagnosis not present

## 2023-05-10 ENCOUNTER — Other Ambulatory Visit: Payer: Self-pay | Admitting: Cardiology

## 2023-05-15 DIAGNOSIS — N186 End stage renal disease: Secondary | ICD-10-CM | POA: Diagnosis not present

## 2023-05-18 ENCOUNTER — Other Ambulatory Visit: Payer: Self-pay | Admitting: *Deleted

## 2023-05-18 NOTE — Patient Outreach (Signed)
 Medicaid Managed Care   Nurse Care Manager Note  05/18/2023 Name:  Latoya Walsh MRN:  [id number] DOB:  [birth year]  Latoya Walsh is an 63 y.o. year old female who is a primary patient of [name], Dinah Walsh, Walsh.  The [facility] Managed Care Coordination team was consulted for assistance with:    Stroke DMII  Latoya Walsh was given information about Medicaid Managed Care Coordination team services today. Latoya Walsh Patient agreed to services and verbal consent obtained.  Engaged with patient by telephone for follow up visit in response to provider referral for case management and/or care coordination services.   Patient is participating in a Managed Medicaid Plan:  Yes  Assessments/Interventions:  Review of past medical history, allergies, medications, health status, including review of consultants reports, laboratory and other test data, was performed as part of comprehensive evaluation and provision of chronic care management services.  SDOH (Social Drivers of Health) assessments and interventions performed: SDOH Interventions    Flowsheet Row Office Visit from 02/23/2023 in [facility] Patient Outreach Telephone from 02/16/2023 in [place] POPULATION HEALTH DEPARTMENT Patient Outreach Telephone from 01/28/2023 in [place] POPULATION HEALTH DEPARTMENT Patient Outreach Telephone from 12/30/2022 in [place] POPULATION HEALTH DEPARTMENT Patient Outreach Telephone from 09/30/2022 in [place] POPULATION HEALTH DEPARTMENT Patient Outreach Telephone from 04/28/2022 in [place] POPULATION HEALTH DEPARTMENT  SDOH Interventions        Food Insecurity Interventions -- -- -- Intervention Not Indicated Intervention Not Indicated Intervention Not Indicated  Housing Interventions -- -- -- -- Intervention Not Indicated Intervention Not Indicated  Transportation Interventions -- Payor Benefit  [facility] assisted with arranging transportation with [facility] medical  transportation] Payor Benefit  [RNCM provided with [facility] medical [phone]] Payor Benefit -- Payor Benefit  Utilities Interventions -- -- -- -- -- Intervention Not Indicated  Depression Interventions/Treatment  Currently on Treatment -- -- -- -- --       Care Plan  Allergies  Allergen Reactions   Penicillins Itching and Rash    Medications Reviewed Today     Reviewed by Latoya Dach, RN (Registered Nurse) on 05/18/23 at 1554  Med List Status: <None>   Medication Order Taking? Sig Documenting Provider Last Dose Status Informant  albuterol (VENTOLIN HFA) 108 (90 Base) MCG/ACT inhaler [id number]  Inhale 2 puffs into the lungs every 4 (four) hours as needed for wheezing or shortness of breath. Latoya Walsh  Active Self, Pharmacy Records, Family Member           Med Note (CRUTHIS, CHLOE Walsh   Tue Mar 02, 2023 10:50 AM) Pt is unsure of last dose.   amLODipine (NORVASC) 10 MG tablet [id number] Yes Take 10 mg by mouth every morning. Provider, Historical, Walsh Taking Active   aspirin EC 81 MG tablet [id number] Yes Take 1 tablet (81 mg total) by mouth daily. Swallow whole. Latoya Penny, Walsh Taking Active   Blood Glucose Monitoring Suppl (ONETOUCH VERIO FLEX SYSTEM) w/Device Latoya Walsh [id number] Yes USE TO TEST BLOOD SUGAR TWICE DAILY AS DIRECTED *REFILL REQUEST* [name], Dinah Walsh, Walsh Taking Active Self, Pharmacy Records, Family Member  busPIRone (BUSPAR) 5 MG tablet [id number] No Take 1 tablet (5 mg total) by mouth 2 (two) times daily.  Patient not taking: Reported on 04/13/2023   [name], Donalee Citrin, Walsh Not Taking Active Self, Pharmacy Records, Family Member  COMFORT EZ PEN NEEDLES 32G X 4 MM MISC [id number] Yes USE AS DIRECTED with insulin injections  Latoya Walsh Taking Active Self, Pharmacy Records, Family Member  Continuous Glucose Sensor (FREESTYLE LIBRE 3 SENSOR) [place] [id number] No 1 Device by Does not apply route continuous. Place 1 sensor on the skin every 14 days. Use to  check glucose continuously  Patient not taking: Reported on 04/13/2023   Latoya Walsh Not Taking Active Self, Pharmacy Records, Family Member           Med Note Latoya Walsh, [place] A   Thu Jan 28, 2023  9:09 AM) Has not picked up from the pharmacy  D3-1000 25 MCG (1000 UT) capsule [id number] Yes Take 1,000 Units by mouth daily. Provider, Historical, Walsh Taking Active Self, Pharmacy Records, Family Member  docusate sodium (COLACE) 100 MG capsule [id number] No Take 1 capsule (100 mg total) by mouth daily as needed for mild constipation.  Patient not taking: Reported on 04/13/2023   [name], Donalee Citrin, Walsh Not Taking Active Self, Pharmacy Records, Family Member  insulin glargine (LANTUS SOLOSTAR) 100 UNIT/ML Solostar Pen [id number] Yes Inject 38 Units into the skin at bedtime. [name], Donalee Citrin, Walsh Taking Active Self, Pharmacy Records, Family Member           Med Note (CRUTHIS, CHLOE Walsh   Tue Mar 02, 2023 10:32 AM)    insulin lispro (HUMALOG KWIKPEN) 100 UNIT/ML KwikPen [id number] No Inject 6 Units into the skin 2 (two) times daily.  Patient not taking: Reported on 05/18/2023   Latoya [place], Walsh Not Taking Active Self, Pharmacy Records, Family Member           Med Note (CRUTHIS, Marcy Siren   Tue Mar 02, 2023 10:51 AM) Pt is adamant she is using this medication and has been injecting 45 units once daily. Dispense report does not support this claim.   Insulin Pen Needle (PEN NEEDLES) 31G X 5 MM MISC [id number] Yes Use to inject insulin once daily Latoya Walsh, Freddi Starr, Walsh Taking Active Self, Pharmacy Records, Family Member  Iron, Ferrous Sulfate, 325 (65 Fe) MG TABS [id number] Yes Take 325 mg by mouth daily. [name], Donalee Citrin, Walsh Taking Active Self, Pharmacy Records, Family Member  losartan (COZAAR) 50 MG tablet [id number] Yes Take 50 mg by mouth daily. Provider, Historical, Walsh Taking Active   metoprolol succinate (TOPROL-XL) 50 MG 24 hr tablet [id number] Yes Take 1 tablet (50 mg total) by mouth daily. Please schedule an  appointment for further refills. [name], Walsh Taking Active   mirtazapine (REMERON) 15 MG tablet [id number] Yes TAKE 1 TABLET BY MOUTH EACH EVENING [name], Dinah Walsh, Walsh Taking Active   rosuvastatin (CRESTOR) 10 MG tablet [id number] Yes TAKE 1 TABLET BY MOUTH EVERY MORNING [name], Dinah Walsh, Walsh Taking Active   Sevelamer Carbonate (RENVELA PO) [id number] Yes Take 1 packet by mouth See admin instructions. 1 packet with each meal and with snacks Provider, Historical, Walsh Taking Active Self, Pharmacy Records, Family Member           Med Note (CRUTHIS, CHLOE Walsh   Tue Mar 02, 2023 10:52 AM) Pt is unsure of the dose of her packets. No fill hx found.   sevelamer carbonate (RENVELA) 800 MG tablet [id number] Yes Take 800 mg by mouth 3 (three) times daily with meals. Provider, Historical, Walsh Taking Active Self, Pharmacy Records, Family Member           Med Note [name] Jan 01, 2022 10:12 AM)    sucroferric oxyhydroxide (VELPHORO) 500 MG chewable tablet [id number] Yes Chew  500 mg by mouth 3 (three) times daily with meals. Provider, Historical, Walsh Taking Active Self, Pharmacy Records, Family Member  Med List Note [name], [place] [birth year] 0981):              Patient Active Problem List   Diagnosis Date Noted   Moderately severe major depression (HCC) 03/25/2023   Acute CVA (cerebrovascular accident) (HCC) 03/02/2023   Secondary hyperparathyroidism (HCC) 12/31/2022   ESRD on dialysis (HCC) 01/06/2021   CHF (congestive heart failure) (HCC) 06/06/2020   Leg swelling 04/28/2020   Hyperlipidemia LDL goal <100 04/28/2020   Left hand pain 03/10/2020   Anemia 02/19/2020   COVID-19 virus infection 12/26/2019   Depression, major, single episode, in partial remission (HCC) 01/25/2019   Headache 11/13/2018   Insomnia 09/25/2018   Screening for colorectal cancer 07/23/2016   Lumbar back pain with radiculopathy affecting right lower extremity 03/27/2015   Non compliance with medical  treatment 07/22/2014   Chronic hepatitis Walsh without hepatic coma (HCC) 12/04/2013   Allergic rhinitis 11/03/2012   Anxiety and depression 02/05/2011   Nicotine dependence 11/01/2010   Dermatitis 10/30/2010   Drug dependence, continuous abuse (HCC) 03/22/2010   Uncontrolled type 2 diabetes mellitus with hyperglycemia (HCC) 06/02/2007   Alcohol abuse 06/02/2007    Conditions to be addressed/monitored per PCP order:  DMII  Care Plan : RN Care Manager Plan of Care  Updates made by Latoya Dach, RN since 05/18/2023 12:00 AM     Problem: Health Management needs related to DMII and HF      Long-Range Goal: Development of Plan of Care to address Health Management needs related to DMII and HF   Start Date: 07/10/2021  Expected End Date: 03/23/2023  Priority: High  Note:   Current Barriers:  Chronic Disease Management support and education needs related to CHF and DMII    RNCM Clinical Goal(s):  Patient will verbalize understanding of plan for management of CHF and DMII as evidenced by patient verbalization of self monitoring activities take all medications exactly as prescribed and will call provider for medication related questions as evidenced by documentation in EMR    attend all scheduled medical appointments: Dialysis on M/W/F and 05/20/23 at [facility]  as evidenced by provider documentation          Interventions: Inter-disciplinary care team collaboration (see longitudinal plan of care) Evaluation of current treatment plan related to  self management and patient's adherence to plan as established by provider Provided therapeutic listening   Stroke:  (Status:Goal on track:  Yes.) Long Term Goal Reviewed Importance of taking all medications as prescribed Reviewed Importance of attending all scheduled provider appointments Advised to report any changes in symptoms or exercise tolerance Assessed social determinant of health barriers Discussed the importance of attending  appointment with Neurology/Stroke Clinic on 04/13/23 Medications reviewed  Diabetes:  (Status: Goal on Track (progressing): YES.) Long Term Goal  Lab Results  Component Value Date   HGBA1C 9.3 (H) 03/03/2023   @ Assessed patient's understanding of A1c goal: <8% Provided education to patient about basic DM disease process; Reviewed prescribed diet with patient diabetic; Counseled on importance of regular laboratory monitoring as prescribed;        Discussed plans with patient for ongoing care management follow up and provided patient with direct contact information for care management team;      Reviewed scheduled/upcoming provider appointments including:  05/20/23 with [facility] ;  Review of patient status, including review of consultants reports, relevant laboratory and other test results, and medications completed;       Assessed social determinant of health barriers;        Encouragement provided Reviewed medications  Discussed the benefits of exercise, advised patient to exercise every day at least 30 minutesAssisted with rescheduling missed Endocrinology and VVS visits-awaiting response Provided with [facility] medical transportation [phone] Discussed upcoming Eye exam with [facility] on 05/20/23 at 2pm Collaborated with [employer] [phone] regarding patient having group scheduled on T/TH Assisted with scheduling transportation on  05/20/23 pick up time at 8:15am going to group, pick up from group at 1:15pm going to [facility], return ride home at 3:30pm-all details discussed with patient and her brother, patient wrote down details Collaborated with scheduling to reschedule Speech therapy and TFC Advised patient to call [phone]  to reschedule Speech Therapy   Patient Goals/Self-Care Activities: Take medications as prescribed   Attend all scheduled provider appointments Call provider office for new concerns or questions  use salt in  moderation eat more whole grains, fruits and vegetables, lean meats and healthy fats dress right for the weather, hot or cold Complete requested lab work       Follow Up:  Patient agrees to Care Plan and Follow-up.  Plan: The Managed Medicaid care management team will reach out to the patient again over the next 30 days.  Date/time of next scheduled RN care management/care coordination outreach:  06/03/23 at 2:30pm  Estanislado Emms RN, BSN [place]  Value-Based Care Institute [facility] Health RN Care Manager [phone]

## 2023-05-18 NOTE — Patient Instructions (Addendum)
 Visit Information  [name] was given information about Medicaid Managed Care team care coordination services as a part of their [facility] Community Plan Medicaid benefit. Latoya Walsh verbally consented to engagement with the [facility] Managed Care team.   If you are experiencing a medical emergency, please call 911 or report to your local emergency department or urgent care.   If you have a non-emergency medical problem during routine business hours, please contact your provider's office and ask to speak with a nurse.   For questions related to your [facility], please call: [phone] or visit the homepage here: [email or web address]  If you would like to schedule transportation through your [facility], please call the following number at least 2 days in advance of your appointment: [phone]   Rides for urgent appointments can also be made after hours by calling Member Services.  Call the Behavioral Health Crisis Line at [phone], at any time, 24 hours a day, 7 days a week. If you are in danger or need immediate medical attention call 911.  If you would like help to quit smoking, call 1-800-QUIT-NOW (732 838 4283) OR Espaol: 1-855-Djelo-Ya (2-725-366-4403) o para ms informacin haga clic aqu or Text READY to 474-259 to register via text  [name],   Please see education materials related to DM provided by MyChart link. 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   The patient verbalized understanding of instructions, educational materials, and care plan provided today and agreed to receive a mailed copy of patient instructions, educational materials, and care plan.   Telephone follow up appointment with Managed Medicaid care management team member scheduled for:06/03/23 at 2:30pm  Latoya Emms RN, BSN [place]  Value-Based Care Institute [facility] Health RN Care  Manager [phone]   Following is a copy of your plan of care:  Care Plan : RN Care Manager Plan of Care  Updates made by Latoya Dach, RN since 05/18/2023 12:00 AM     Problem: Health Management needs related to DMII and HF      Long-Range Goal: Development of Plan of Care to address Health Management needs related to DMII and HF   Start Date: 07/10/2021  Expected End Date: 03/23/2023  Priority: High  Note:   Current Barriers:  Chronic Disease Management support and education needs related to CHF and DMII    RNCM Clinical Goal(s):  Patient will verbalize understanding of plan for management of CHF and DMII as evidenced by patient verbalization of self monitoring activities take all medications exactly as prescribed and will call provider for medication related questions as evidenced by documentation in EMR    attend all scheduled medical appointments: Dialysis on M/W/F and 05/20/23 at [facility]  as evidenced by provider documentation          Interventions: Inter-disciplinary care team collaboration (see longitudinal plan of care) Evaluation of current treatment plan related to  self management and patient's adherence to plan as established by provider Provided therapeutic listening   Stroke:  (Status:Goal on track:  Yes.) Long Term Goal Reviewed Importance of taking all medications as prescribed Reviewed Importance of attending all scheduled provider appointments Advised to report any changes in symptoms or exercise tolerance Assessed social determinant of health barriers Discussed the importance of attending appointment with Neurology/Stroke Clinic on 04/13/23 Medications reviewed  Diabetes:  (Status: Goal on Track (progressing): YES.) Long Term Goal  Lab Results  Component Value Date   HGBA1C 9.3 (H) 03/03/2023   @  Assessed patient's understanding of A1c goal: <8% Provided education to patient about basic DM disease process; Reviewed prescribed diet with  patient diabetic; Counseled on importance of regular laboratory monitoring as prescribed;        Discussed plans with patient for ongoing care management follow up and provided patient with direct contact information for care management team;      Reviewed scheduled/upcoming provider appointments including:  05/20/23 with [facility] ;         Review of patient status, including review of consultants reports, relevant laboratory and other test results, and medications completed;       Assessed social determinant of health barriers;        Encouragement provided Reviewed medications  Discussed the benefits of exercise, advised patient to exercise every day at least 30 minutesAssisted with rescheduling missed Endocrinology and VVS visits-awaiting response Provided with [facility] medical transportation [phone] Discussed upcoming Eye exam with [facility] on 05/20/23 at 2pm Collaborated with [employer] [phone] regarding patient having group scheduled on T/TH Assisted with scheduling transportation on  05/20/23 pick up time at 8:15am going to group, pick up from group at 1:15pm going to [facility], return ride home at 3:30pm-all details discussed with patient and her brother, patient wrote down details Collaborated with scheduling to reschedule Speech therapy and TFC Advised patient to call [phone]  to reschedule Speech Therapy   Patient Goals/Self-Care Activities: Take medications as prescribed   Attend all scheduled provider appointments Call provider office for new concerns or questions  use salt in moderation eat more whole grains, fruits and vegetables, lean meats and healthy fats dress right for the weather, hot or cold Complete requested lab work

## 2023-05-20 DIAGNOSIS — N186 End stage renal disease: Secondary | ICD-10-CM | POA: Diagnosis not present

## 2023-05-21 DIAGNOSIS — N186 End stage renal disease: Secondary | ICD-10-CM | POA: Diagnosis not present

## 2023-05-21 DIAGNOSIS — E1122 Type 2 diabetes mellitus with diabetic chronic kidney disease: Secondary | ICD-10-CM | POA: Diagnosis not present

## 2023-05-21 DIAGNOSIS — Z992 Dependence on renal dialysis: Secondary | ICD-10-CM | POA: Diagnosis not present

## 2023-05-22 DIAGNOSIS — N186 End stage renal disease: Secondary | ICD-10-CM | POA: Diagnosis not present

## 2023-06-01 DIAGNOSIS — N186 End stage renal disease: Secondary | ICD-10-CM | POA: Diagnosis not present

## 2023-06-03 ENCOUNTER — Other Ambulatory Visit: Payer: Self-pay | Admitting: *Deleted

## 2023-06-03 DIAGNOSIS — N186 End stage renal disease: Secondary | ICD-10-CM | POA: Diagnosis not present

## 2023-06-03 NOTE — Patient Outreach (Signed)
  Medicaid Managed Care   Unsuccessful Attempt Note   06/03/2023 Name: Latoya Walsh MRN: [id number] DOB: 21-Jan-1961  Referred by: Caesar Bookman, NP Reason for referral : High Risk Managed Medicaid (Unsuccessful RNCM follow up telephone outreach)   An unsuccessful telephone outreach was attempted today. The patient was referred to the case management team for assistance with care management and care coordination.    Follow Up Plan: The Managed Medicaid care management team will reach out to the patient again over the next 7 days.    Estanislado Emms RN, BSN [place]  Value-Based Care Institute [facility] Health RN Care Manager [phone]

## 2023-06-03 NOTE — Patient Instructions (Signed)
 Visit Information  [name]  - as a part of your Medicaid benefit, you are eligible for care management and care coordination services at no cost or copay. I was unable to reach you by phone today but would be happy to help you with your health related needs. Please feel free to call me @ [phone].   A member of the Managed Medicaid care management team will reach out to you again over the next 7 days.   Estanislado Emms RN, BSN [place]  Value-Based Care Institute [facility] Health RN Care Manager [phone]

## 2023-06-05 DIAGNOSIS — N186 End stage renal disease: Secondary | ICD-10-CM | POA: Diagnosis not present

## 2023-06-08 ENCOUNTER — Other Ambulatory Visit: Payer: Self-pay | Admitting: Cardiology

## 2023-06-10 ENCOUNTER — Ambulatory Visit: Payer: Medicaid Other | Admitting: Podiatry

## 2023-06-17 DIAGNOSIS — N186 End stage renal disease: Secondary | ICD-10-CM | POA: Diagnosis not present

## 2023-06-19 DIAGNOSIS — N186 End stage renal disease: Secondary | ICD-10-CM | POA: Diagnosis not present

## 2023-06-21 DIAGNOSIS — N186 End stage renal disease: Secondary | ICD-10-CM | POA: Diagnosis not present

## 2023-06-21 DIAGNOSIS — E1122 Type 2 diabetes mellitus with diabetic chronic kidney disease: Secondary | ICD-10-CM | POA: Diagnosis not present

## 2023-06-21 DIAGNOSIS — Z992 Dependence on renal dialysis: Secondary | ICD-10-CM | POA: Diagnosis not present

## 2023-06-22 DIAGNOSIS — N186 End stage renal disease: Secondary | ICD-10-CM | POA: Diagnosis not present

## 2023-06-24 DIAGNOSIS — N186 End stage renal disease: Secondary | ICD-10-CM | POA: Diagnosis not present

## 2023-06-26 DIAGNOSIS — N186 End stage renal disease: Secondary | ICD-10-CM | POA: Diagnosis not present

## 2023-06-28 ENCOUNTER — Other Ambulatory Visit: Payer: Self-pay

## 2023-06-28 NOTE — Patient Outreach (Signed)
 Complex Care Management   Visit Note  06/28/2023  Name:  Latoya Walsh MRN: [id number] DOB: 12/09/1960  Situation: Referral received for Complex Care Management related to Heart Failure, Stroke, and ESRD I obtained verbal consent from patient.  Visit partially completed with patient  on the phone  Background:   Past Medical History:  Diagnosis Date   Allergic rhinitis    Anxiety    CHF (congestive heart failure) (HCC)    a. EF 30-35% by echo in 05/2020 b. EF at 45% in 09/2020   Chronic back pain    Chronic hepatitis C without hepatic coma (HCC)    COVID-19 virus infection 12/26/2019   Depression    Diabetes mellitus    Hepatitis C    Hypertension    Noncompliance    Poor appetite 07/17/2014   Substance abuse (HCC)    HX of drug use and alcohol use    Assessment: Patient Reported Symptoms:  Cognitive Alert and oriented to person, place, and time  Neurological      HEENT      Cardiovascular      Respiratory      Endocrine      Gastrointestinal      Genitourinary      Integumentary      Musculoskeletal      Psychosocial       There were no vitals filed for this visit.  Medications Reviewed Today   Medications were not reviewed in this encounter     Recommendation:   Patient was not able to complete assessment on this outreach due to appt with Dialysis today.  This RNCM will continue conversation regarding  patient stated goal of weight loss on next contact, and address any other concerns.   Follow Up Plan:   Telephone follow-up Thursday, April 10th at 2:30pm   Jeani Hawking BSN, CCM [place]  [facility] Health RN Care Manager Direct Dial: [phone]  Fax: [phone]

## 2023-06-28 NOTE — Patient Instructions (Signed)
 Visit Information  Thank you for taking time to visit with me today. Please don't hesitate to contact me if I can be of assistance to you before our next scheduled appointment.  Our next appointment is by telephone on April 10th  at 2:30pm Please call the care guide team at [phone] if you need to cancel or reschedule your appointment.   Following is a copy of your care plan:   Goals Addressed               This Visit's Progress     VBCI RN Care Plan (pt-stated)        Problems:  Chronic Disease Management support and education needs related to weight loss.   Goal: Over the next six  months the Patient will continue to work with RN Care Manager and/or Social Worker to address care management and care coordination needs related to weight loss as evidenced by adherence to CM Team Scheduled appointments      Interventions:   Weight Loss Interventions:  (Status:  New goal.) Long Term Goal Provided verbal and/or written education to patient re: provider recommended life style modifications    Patient Self-Care Activities:  Attend all scheduled provider appointments Call provider office for new concerns or questions   Plan:  Telephone follow up appointment with care management team member scheduled for:  Thursday, April 10th at  2:30pm.              Please call the Suicide and Crisis Lifeline: 988 if you are experiencing a Mental Health or Behavioral Health Crisis or need someone to talk to.  Patient verbalizes understanding of instructions and care plan provided today and agrees to view in MyChart. Active MyChart status and patient understanding of how to access instructions and care plan via MyChart confirmed with patient.     Jeani Hawking BSN, CCM [place]  [facility] Population Health RN Care Manager Direct Dial: [phone]  Fax: [phone]

## 2023-06-29 DIAGNOSIS — N186 End stage renal disease: Secondary | ICD-10-CM | POA: Diagnosis not present

## 2023-07-01 ENCOUNTER — Other Ambulatory Visit: Payer: Self-pay

## 2023-07-01 ENCOUNTER — Ambulatory Visit: Payer: Self-pay

## 2023-07-01 DIAGNOSIS — N186 End stage renal disease: Secondary | ICD-10-CM | POA: Diagnosis not present

## 2023-07-02 NOTE — Patient Outreach (Addendum)
 Complex Care Management   Visit Note  07/02/2023  Name:  Latoya Walsh MRN: [id number] DOB: 1960/11/21  Situation: Referral received for Complex Care Management related to Diabetes with Complications I obtained verbal consent from patient.  Visit completed with patient  on the phone  Background:   Past Medical History:  Diagnosis Date   Allergic rhinitis    Anxiety    CHF (congestive heart failure) (HCC)    a. EF 30-35% by echo in 05/2020 b. EF at 45% in 09/2020   Chronic back pain    Chronic hepatitis C without hepatic coma (HCC)    COVID-19 virus infection 12/26/2019   Depression    Diabetes mellitus    Hepatitis C    Hypertension    Noncompliance    Poor appetite 07/17/2014   Substance abuse (HCC)    HX of drug use and alcohol use    Assessment: Patient Reported Symptoms:  Cognitive Alert and oriented to person, place, and time, Requires Assistance Decision Making  Neurological      HEENT      Cardiovascular      Respiratory      Endocrine  (Patient reports fasting blood sugar on 07/01/23: 223mg /dL.  She was not able to provide any other readings today.)    Gastrointestinal      Genitourinary      Integumentary      Musculoskeletal      Psychosocial       There were no vitals filed for this visit.  Medications Reviewed Today   Medications were not reviewed in this encounter     Recommendation:   Specialty provider follow-up : this CMRN assisted patient in making appointment with Dr. Virgilio Frees for 07/27/23 at 2:20pm due to previously missed appointments  Educated patient on taking blood sugars at least first thing in the AM for fasting blood sugars and two hours after a meal, log and take info AND meter to Endo appt.  Educated on taking insulin as ordered.    Follow Up Plan:   Telephone follow-up in two weeks on 07/15/23 at 2:00pm.   Jeani Hawking BSN, CCM [place]  [facility] Health RN Care Manager Direct Dial: [phone]  Fax:  [phone]

## 2023-07-02 NOTE — Patient Instructions (Signed)
 Visit Information  Thank you for taking time to visit with me today. Please don't hesitate to contact me if I can be of assistance to you before our next scheduled appointment.  Our next appointment is by telephone on Thursday April 24 at 2:00pm Please call the care guide team at [phone] if you need to cancel or reschedule your appointment.   I have included an education sheet on how to manage diabetes - the plate planner is a great guide to help you plan your meals.   Following is a copy of your care plan:   Goals Addressed             This Visit's Progress    VBCI RN Care Plan       Problems:  Non-adherence to scheduled provider appointments  Goal: Over the next 30 days the Patient will attend all scheduled medical appointments: with Endocrinology as evidenced by attending appt on 07/27/23 at 2:20pm.          Interventions:   Evaluation of current treatment plan related to DMII, Memory Deficits self-management and patient's adherence to plan as established by provider. Discussed plans with patient for ongoing care management follow up and provided patient with direct contact information for care management team Evaluation of current treatment plan related to DM and patient's adherence to plan as established by provider Advised patient to attend appointments with Endo for better management of DM.   Patient Self-Care Activities:  Attend all scheduled provider appointments  Plan:  Telephone follow up appointment with care management team member scheduled for:  Thursday,  April 24 at 2pm.           VBCI RN Care Plan       Problems:  Chronic Disease Management support and education needs related to DMII  Goal: Over the next one year the Patient will demonstrate Improved adherence to prescribed treatment plan for DMII as evidenced by reaching A1C goal of <7% as set by Endo provider.  take all medications exactly as prescribed and will call provider for medication related  questions as evidenced by contacting Endo provider for blood glucose frequently 60mg /dL, or persistently more than 350mg /dL.       Interventions:   Diabetes Interventions: Assessed patient's understanding of A1c goal: <7% Provided education to patient about basic DM disease process Reviewed medications with patient and discussed importance of medication adherence Counseled on importance of regular laboratory monitoring as prescribed Discussed plans with patient for ongoing care management follow up and provided patient with direct contact information for care management team Reviewed scheduled/upcoming provider appointments including: Endo on 07/27/23 at 2:20pm with Zenaida Niece transporation arriving at 1:40 Lab Results  Component Value Date   HGBA1C 9.3 (H) 03/03/2023    Patient Self-Care Activities:  Attend all scheduled provider appointments Call provider office for new concerns or questions  Take medications as prescribed    Plan:  Telephone follow up appointment with care management team member scheduled for:  Thursday, April 24  at 2pm.              Please call the Suicide and Crisis Lifeline: 988 if you are experiencing a Mental Health or Behavioral Health Crisis or need someone to talk to.  Patient verbalizes understanding of instructions and care plan provided today and agrees to view in MyChart. Active MyChart status and patient understanding of how to access instructions and care plan via MyChart confirmed with patient.     [employer], CCM [employer]  Health  [facility] Population Health RN Care Manager Direct Dial: [phone]  Fax: [phone]

## 2023-07-03 DIAGNOSIS — N186 End stage renal disease: Secondary | ICD-10-CM | POA: Diagnosis not present

## 2023-07-06 DIAGNOSIS — N186 End stage renal disease: Secondary | ICD-10-CM | POA: Diagnosis not present

## 2023-07-08 DIAGNOSIS — N186 End stage renal disease: Secondary | ICD-10-CM | POA: Diagnosis not present

## 2023-07-09 ENCOUNTER — Other Ambulatory Visit: Payer: Self-pay | Admitting: Cardiology

## 2023-07-10 DIAGNOSIS — N186 End stage renal disease: Secondary | ICD-10-CM | POA: Diagnosis not present

## 2023-07-13 DIAGNOSIS — N186 End stage renal disease: Secondary | ICD-10-CM | POA: Diagnosis not present

## 2023-07-15 ENCOUNTER — Other Ambulatory Visit: Payer: Self-pay

## 2023-07-15 DIAGNOSIS — N186 End stage renal disease: Secondary | ICD-10-CM | POA: Diagnosis not present

## 2023-07-16 NOTE — Patient Outreach (Signed)
 Complex Care Management   Visit Note  07/15/2023  Name:  Latoya Walsh MRN: [id number] DOB: April 18, 1960  Situation: Referral received for Complex Care Management related to Stroke and Diabetes with Complications I obtained verbal consent from Patient.  Visit completed with patient and brother [place] on the phone  Background:   Past Medical History:  Diagnosis Date   Allergic rhinitis    Anxiety    CHF (congestive heart failure) (HCC)    a. EF 30-35% by echo in 05/2020 b. EF at 45% in 09/2020   Chronic back pain    Chronic hepatitis C without hepatic coma (HCC)    COVID-19 virus infection 12/26/2019   Depression    Diabetes mellitus    Hepatitis C    Hypertension    Noncompliance    Poor appetite 07/17/2014   Substance abuse (HCC)    HX of drug use and alcohol use    Assessment: Patient Reported Symptoms:  Cognitive Cognitive Status: Alert and oriented to person, place, and time      Neurological Neurological Review of Symptoms: No symptoms reported Neurological Conditions: Stroke, ischemic Neurological Management Strategies: Routine screening, Medication therapy Neurological Self-Management Outcome: 4 (good)  HEENT HEENT Symptoms Reported: Sore throat HEENT Conditions: Mouth sore(s) HEENT Self-Management Outcome: 3 (uncertain) Mouth sore(s)  Cardiovascular Cardiovascular Symptoms Reported: No symptoms reported Cardiovascular Conditions: Heart failure (CVA) Cardiovascular Management Strategies: Medication therapy, Diet modification, Routine screening Cardiovascular Self-Management Outcome: 4 (good)  Respiratory      Endocrine Patient reports the following symptoms related to hypoglycemia or hyperglycemia : No symptoms reported Is patient diabetic?: Yes Is patient checking blood sugars at home?: Yes Endocrine Conditions: Diabetes, Thyroid  disorder Endocrine Management Strategies: Medication therapy, Routine screening Endocrine Self-Management Outcome: 4 (good)   Gastrointestinal Gastrointestinal Symptoms Reported: No symptoms reported   Nutrition Risk Screen (CP): No indicators present  Genitourinary Genitourinary Symptoms Reported: No symptoms reported    Integumentary Integumentary Symptoms Reported: Skin changes Skin Conditions: Itching Skin Management Strategies: Medication therapy, Routine screening Skin Self-Management Outcome: 4 (good)  Musculoskeletal Musculoskelatal Symptoms Reviewed: No symptoms reported   Falls in the past year?: No Number of falls in past year: 1 or less Was there an injury with Fall?: No Fall Risk Category Calculator: 0 Patient Fall Risk Level: Low Fall Risk Patient at Risk for Falls Due to: No Fall Risks  Psychosocial Psychosocial Symptoms Reported: No symptoms reported   Major Change/Loss/Stressor/Fears (CP): Medical condition, family Behaviors When Feeling Stressed/Fearful: sister passed away Techniques to Cope with Loss/Stress/Change: Diversional activities, Spiritual practice(s) Quality of Family Relationships: helpful Do you feel physically threatened by others?: No      07/15/2023    2:10 PM  Depression screen PHQ 2/9  Decreased Interest 0  Down, Depressed, Hopeless 1  PHQ - 2 Score 1    There were no vitals filed for this visit.  Medications Reviewed Today     Reviewed by Kaylene Pascal, RN (Registered Nurse) on 07/15/23 at 1355  Med List Status: <None>   Medication Order Taking? Sig Documenting Provider Last Dose Status Informant  albuterol  (VENTOLIN  HFA) 108 (90 Base) MCG/ACT inhaler [id number] Yes Inhale 2 puffs into the lungs every 4 (four) hours as needed for wheezing or shortness of breath. Cornelius Dill, DO Taking Active Self, Pharmacy Records, Family Member           Med Note (CRUTHIS, CHLOE C   Tue Mar 02, 2023 10:50 AM) Pt is unsure of last dose.  amLODipine  (NORVASC ) 10 MG tablet [id number] Yes Take 10 mg by mouth every morning. [provider] Taking Active   aspirin  EC  81 MG tablet [id number] Yes Take 1 tablet (81 mg total) by mouth daily. Swallow whole. Clem Currier, NP Taking Active   Blood Glucose Monitoring Suppl (ONETOUCH VERIO FLEX SYSTEM) w/Device Suzanne Erps [id number]  USE TO TEST BLOOD SUGAR TWICE DAILY AS DIRECTED *REFILL REQUEST* [name], [name], NP  Active Self, Pharmacy Records, Family Member  busPIRone  (BUSPAR ) 5 MG tablet [id number] No Take 1 tablet (5 mg total) by mouth 2 (two) times daily.  Patient not taking: Reported on 04/13/2023   [name], Elijio [place], NP Not Taking Active Self, Pharmacy Records, Family Member  COMFORT EZ PEN NEEDLES 32G X 4 MM MISC [id number]  USE AS DIRECTED with insulin  injections Wendel Hals, NP  Active Self, Pharmacy Records, Family Member  Continuous Glucose Sensor (FREESTYLE LIBRE 3 [place]) [place] [id number]  1 Device by Does not apply route continuous. Place 1 sensor on the skin every 14 days. Use to check glucose continuously Jorge Newcomer, MD  Active Self, Pharmacy Records, Family Member           Med Note (ROBB, MELANIE A   Thu Jan 28, 2023  9:09 AM) Has not picked up from the pharmacy  D3-1000 25 MCG (1000 UT) capsule [id number] Yes Take 1,000 Units by mouth daily. [provider] Taking Active Self, Pharmacy Records, Family Member  docusate sodium  (COLACE) 100 MG capsule [id number] No Take 1 capsule (100 mg total) by mouth daily as needed for mild constipation.  Patient not taking: Reported on 04/13/2023   [name], Elijio [place], NP Not Taking Active Self, Pharmacy Records, Family Member  insulin  glargine (LANTUS  SOLOSTAR) 100 UNIT/ML Solostar Pen [id number] Yes Inject 38 Units into the skin at bedtime. [name], Elijio [place], NP Taking Active Self, Pharmacy Records, Family Member           Med Note Bobbi Burow, Dayla Eva   Tue Mar 02, 2023 10:32 AM)    insulin  lispro (HUMALOG  KWIKPEN) 100 UNIT/ML KwikPen [id number]  Inject 6 Units into the skin 2 (two) times daily.  Patient not taking: Reported on 05/18/2023   Motwani, Komal, MD   Expired 05/26/23 2359 Self, Pharmacy Records, Family Member           Med Note (CRUTHIS, CHLOE C   Tue Mar 02, 2023 10:51 AM) Pt is adamant she is using this medication and has been injecting 45 units once daily. Dispense report does not support this claim.   Insulin  Pen Needle (PEN NEEDLES) 31G X 5 MM MISC [id number]  Use to inject insulin  once daily Wendel Hals, NP  Active Self, Pharmacy Records, Family Member  Iron , Ferrous Sulfate , 325 (65 Fe) MG TABS [id number] No Take 325 mg by mouth daily.  Patient not taking: Reported on 07/01/2023   [name], Elijio [place], NP Not Taking Active Self, Pharmacy Records, Family Member  losartan  (COZAAR ) 50 MG tablet [id number] Yes Take 50 mg by mouth daily. [provider] Taking Active   metoprolol  succinate (TOPROL -XL) 50 MG 24 hr tablet [id number] Yes TAKE 1 TABLET BY MOUTH DAILY *PATIENT NEEDS APPOINTMENT FOR ADDITIONAL REFILLS* Branch, Joyceann No, MD Taking Active   mirtazapine  (REMERON ) 15 MG tablet [id number] Yes TAKE 1 TABLET BY MOUTH EACH EVENING [name], [name], NP Taking Active   rosuvastatin  (CRESTOR ) 10 MG tablet [id number] Yes TAKE 1 TABLET BY MOUTH EVERY MORNING [name], [name], NP Taking Active  Sevelamer  Carbonate (RENVELA  PO) [id number]  Take 1 packet by mouth See admin instructions. 1 packet with each meal and with snacks [provider]  Consider Medication Status and Discontinue (Patient Preference) Self, Pharmacy Records, Family Member           Med Note (CRUTHIS, CHLOE C   Tue Mar 02, 2023 10:52 AM) Pt is unsure of the dose of her packets. No fill hx found.   sevelamer  carbonate (RENVELA ) 800 MG tablet [id number] Yes Take 800 mg by mouth 3 (three) times daily with meals. [provider] Taking Active Self, Pharmacy Records, Family Member           Med Note [place] Al[place], Laurie Poplar   Thu Jan 01, 2022 10:12 AM)    sucroferric oxyhydroxide (VELPHORO) 500 MG chewable tablet [id number] Yes Chew 500 mg by mouth 3 (three) times  daily with meals. Patient sprinkles on her food [provider] Taking Active Self, Pharmacy Records, Family Member  Med List Note Cathye Coca, [place] 08/[birth year]/78 4696):              Recommendation:   Specialty provider follow-up 07/27/23 @2 :20 PM with Dr. Vertell Gory Endocrinologist for a diabetes check   Follow Up Plan:   Telephone follow up appointment date/time:  08/12/23 @11 :00 AM  Louanne Roussel RN BSN CCM [place]  [facility], [facility] Health Nurse Care Coordinator  Direct Dial : [phone] Website: [email or web address]

## 2023-07-16 NOTE — Patient Instructions (Signed)
 Visit Information  Thank you for taking time to visit with me today. Please don't hesitate to contact me if I can be of assistance to you before our next scheduled appointment.  Your next care management appointment is by telephone on May 22, at 11:00 AM  Please call the care guide team at [phone] if you need to cancel, schedule, or reschedule an appointment.   Please call [phone]-TALK (toll free, 24 hour hotline) if you are experiencing a Mental Health or Behavioral Health Crisis or need someone to talk to.  Louanne Roussel RN BSN CCM [place]  [facility], [facility] Health Nurse Care Coordinator  Direct Dial : [phone] Website: [email or web address]

## 2023-07-17 DIAGNOSIS — N186 End stage renal disease: Secondary | ICD-10-CM | POA: Diagnosis not present

## 2023-07-20 DIAGNOSIS — N186 End stage renal disease: Secondary | ICD-10-CM | POA: Diagnosis not present

## 2023-07-21 DIAGNOSIS — Z992 Dependence on renal dialysis: Secondary | ICD-10-CM | POA: Diagnosis not present

## 2023-07-21 DIAGNOSIS — E1122 Type 2 diabetes mellitus with diabetic chronic kidney disease: Secondary | ICD-10-CM | POA: Diagnosis not present

## 2023-07-21 DIAGNOSIS — N186 End stage renal disease: Secondary | ICD-10-CM | POA: Diagnosis not present

## 2023-07-22 DIAGNOSIS — N186 End stage renal disease: Secondary | ICD-10-CM | POA: Diagnosis not present

## 2023-07-24 DIAGNOSIS — N186 End stage renal disease: Secondary | ICD-10-CM | POA: Diagnosis not present

## 2023-07-27 ENCOUNTER — Encounter: Payer: Self-pay | Admitting: "Endocrinology

## 2023-07-27 ENCOUNTER — Ambulatory Visit (INDEPENDENT_AMBULATORY_CARE_PROVIDER_SITE_OTHER): Admitting: "Endocrinology

## 2023-07-27 VITALS — BP 122/80 | HR 71 | Ht 67.0 in | Wt 173.0 lb

## 2023-07-27 DIAGNOSIS — E782 Mixed hyperlipidemia: Secondary | ICD-10-CM | POA: Diagnosis not present

## 2023-07-27 DIAGNOSIS — E1165 Type 2 diabetes mellitus with hyperglycemia: Secondary | ICD-10-CM

## 2023-07-27 DIAGNOSIS — Z794 Long term (current) use of insulin: Secondary | ICD-10-CM | POA: Diagnosis not present

## 2023-07-27 DIAGNOSIS — N186 End stage renal disease: Secondary | ICD-10-CM | POA: Diagnosis not present

## 2023-07-27 LAB — POCT GLYCOSYLATED HEMOGLOBIN (HGB A1C): Hemoglobin A1C: 8.7 % — AB (ref 4.0–5.6)

## 2023-07-27 MED ORDER — FREESTYLE LIBRE 3 PLUS SENSOR MISC: 3 refills | Status: DC

## 2023-07-27 MED ORDER — LANTUS SOLOSTAR 100 UNIT/ML ~~LOC~~ SOPN: 48.0000 [IU] | PEN_INJECTOR | Freq: Every day | SUBCUTANEOUS | 2 refills | Status: DC

## 2023-07-27 MED ORDER — INSULIN LISPRO (1 UNIT DIAL) 100 UNIT/ML (KWIKPEN): 6.0000 [IU] | PEN_INJECTOR | Freq: Two times a day (BID) | SUBCUTANEOUS | 3 refills | Status: DC

## 2023-07-27 MED ORDER — FREESTYLE LIBRE 3 READER DEVI: 1.0000 | 0 refills | Status: DC

## 2023-07-27 NOTE — Progress Notes (Signed)
 Outpatient Endocrinology Note Latoya Newcomer, MD  07/27/23   Latoya Walsh November 28, 1960 [id number]  Referring Provider: Estil Heman, NP Primary Care Provider: Estil Heman, NP Reason for consultation: Subjective   Assessment & Plan  Diagnoses and all orders for this visit:  Uncontrolled type 2 diabetes mellitus with hyperglycemia (HCC) -     POCT glycosylated hemoglobin (Hb A1C) -     insulin  glargine (LANTUS  SOLOSTAR) 100 UNIT/ML Solostar Pen; Inject 48 Units into the skin at bedtime. -     insulin  lispro (HUMALOG  KWIKPEN) 100 UNIT/ML KwikPen; Inject 6 Units into the skin 2 (two) times daily. -     Continuous Glucose Sensor (FREESTYLE LIBRE 3 PLUS SENSOR) MISC; Change sensor every 15 days. -     Continuous Glucose Receiver (FREESTYLE LIBRE 3 READER) DEVI; 1 Device by Does not apply route continuous.  Long-term insulin  use (HCC)  Mixed hypercholesterolemia and hypertriglyceridemia   Diabetes Type II complicated by neuropathy,  Lab Results  Component Value Date   GFR 7.11 (LL) 01/07/2023   Hba1c goal less than 7, current Hba1c is  Lab Results  Component Value Date   HGBA1C 8.7 (A) 07/27/2023   Will recommend the following: Patient is confused about name/dose of insulin , sounds like not taking any Humalog  and only taking Lantus  38 versus 48 units a day Did not bring meter/BG log 07/27/2023 instructed patient to pick up libre device sent to her pharmacy along with the reader, as well as new prescription on her prior doses of insulin  Lantus  and Humalog .  Given patient log she needs to log which insulin  and dose that she is taking so that we can adjust her blood sugars.  Found patient appointment with diabetes educator and eye doctor my visit so that patient can get more support and help.  Patient states she keeps forgetting Refilled on her Lantus , Humalog  and libre  03/09/23 Patient was clearly told to come back within 10 days of applying Dexcom, patient agreed  but did not show up, she made a later appointment and yet did not show up on that appointment.  Patient does not go to the pharmacy to pick up her own device or check with her insurance if it is covered  Recommend to continue previous doses: Lantus  48 units qam Humalog  6 units bidac before 9 am and 4 pm meals  Ordered libre and dexcom, pt did not pick up, said she is "scared" Ordered DM education previously and again today  No known contraindications/side effects to any of above medications  -Last LD and Tg are as follows: Lab Results  Component Value Date   LDLCALC 54 03/03/2023    Lab Results  Component Value Date   TRIG 163 (H) 03/03/2023   -On rosuvastatin  10 mg QD -Follow low fat diet and exercise   -Blood pressure goal <140/90 - Microalbumin/creatinine goal is < 30 -Last MA/Cr is as follows: Lab Results  Component Value Date   MICROALBUR 69.5 (H) 01/07/2023   -on ACE/ARB losartan  50 mg qd -diet changes including salt restriction -limit eating outside -counseled BP targets per standards of diabetes care -uncontrolled blood pressure can lead to retinopathy, nephropathy and cardiovascular and atherosclerotic heart disease  Reviewed and counseled on: -A1C target -Blood sugar targets -Complications of uncontrolled diabetes  -Checking blood sugar before meals and bedtime and bring log next visit -All medications with mechanism of action and side effects -Hypoglycemia management: rule of 15's, Glucagon Emergency Kit and medical alert ID -  low-carb low-fat plate-method diet -At least 20 minutes of physical activity per day -Annual dilated retinal eye exam and foot exam -compliance and follow up needs -follow up as scheduled or earlier if problem gets worse  Call if blood sugar is less than 70 or consistently above 250    Take a 15 gm snack of carbohydrate at bedtime before you go to sleep if your blood sugar is less than 100.    If you are going to fast after midnight  for a test or procedure, ask your physician for instructions on how to reduce/decrease your insulin  dose.    Call if blood sugar is less than 70 or consistently above 250  -Treating a low sugar by rule of 15  (15 gms of sugar every 15 min until sugar is more than 70) If you feel your sugar is low, test your sugar to be sure If your sugar is low (less than 70), then take 15 grams of a fast acting Carbohydrate (3-4 glucose tablets or glucose gel or 4 ounces of juice or regular soda) Recheck your sugar 15 min after treating low to make sure it is more than 70 If sugar is still less than 70, treat again with 15 grams of carbohydrate          Don't drive the hour of hypoglycemia  If unconscious/unable to eat or drink by mouth, use glucagon injection or nasal spray baqsimi and call 911. Can repeat again in 15 min if still unconscious.  Return in about 29 days (around 08/25/2023).   I have reviewed current medications, nurse's notes, allergies, vital signs, past medical and surgical history, family medical history, and social history for this encounter. Counseled patient on symptoms, examination findings, lab findings, imaging results, treatment decisions and monitoring and prognosis. The patient understood the recommendations and agrees with the treatment plan. All questions regarding treatment plan were fully answered.  Latoya Newcomer, MD  07/27/23  History of Present Illness Latoya Walsh is a 63 y.o. year old female who presents for follow up of Type II diabetes mellitus.  Latoya Walsh was first diagnosed around 2019.   Diabetes education ?  Home diabetes regimen: Lantus  48 units qam  COMPLICATIONS -  MI/Stroke -  retinopathy +  neuropathy +  nephropathy ESRD on HD M/W/F  BLOOD SUGAR DATA Did not bring meter 173-225, checks bid  Per recall   Physical Exam  BP 122/80   Pulse 71   Ht 5\' 7"  (1.702 m)   Wt 173 lb (78.5 kg)   LMP 06/23/2010   SpO2 98%   BMI 27.10 kg/m     Constitutional: well developed, well nourished Head: normocephalic, atraumatic Eyes: sclera anicteric, no redness Neck: supple Lungs: normal respiratory effort Neurology: alert and oriented Skin: dry, no appreciable rashes Musculoskeletal: no appreciable defects Psychiatric: normal mood and affect Diabetic Foot Exam - Simple   No data filed      Current Medications Patient's Medications  New Prescriptions   CONTINUOUS GLUCOSE RECEIVER (FREESTYLE LIBRE 3 READER) DEVI    1 Device by Does not apply route continuous.   CONTINUOUS GLUCOSE SENSOR (FREESTYLE LIBRE 3 PLUS SENSOR) MISC    Change sensor every 15 days.  Previous Medications   ALBUTEROL  (VENTOLIN  HFA) 108 (90 BASE) MCG/ACT INHALER    Inhale 2 puffs into the lungs every 4 (four) hours as needed for wheezing or shortness of breath.   AMLODIPINE  (NORVASC ) 10 MG TABLET    Take 10 mg  by mouth every morning.   ASPIRIN  EC 81 MG TABLET    Take 1 tablet (81 mg total) by mouth daily. Swallow whole.   BLOOD GLUCOSE MONITORING SUPPL (ONETOUCH VERIO FLEX SYSTEM) W/DEVICE KIT    USE TO TEST BLOOD SUGAR TWICE DAILY AS DIRECTED *REFILL REQUEST*   BUSPIRONE  (BUSPAR ) 5 MG TABLET    Take 1 tablet (5 mg total) by mouth 2 (two) times daily.   COMFORT EZ PEN NEEDLES 32G X 4 MM MISC    USE AS DIRECTED with insulin  injections   CONTINUOUS GLUCOSE SENSOR (FREESTYLE LIBRE 3 SENSOR) MISC    1 Device by Does not apply route continuous. Place 1 sensor on the skin every 14 days. Use to check glucose continuously   D3-1000 25 MCG (1000 UT) CAPSULE    Take 1,000 Units by mouth daily.   DOCUSATE SODIUM  (COLACE) 100 MG CAPSULE    Take 1 capsule (100 mg total) by mouth daily as needed for mild constipation.   INSULIN  PEN NEEDLE (PEN NEEDLES) 31G X 5 MM MISC    Use to inject insulin  once daily   IRON , FERROUS SULFATE , 325 (65 FE) MG TABS    Take 325 mg by mouth daily.   LOSARTAN  (COZAAR ) 50 MG TABLET    Take 50 mg by mouth daily.   METOPROLOL  SUCCINATE  (TOPROL -XL) 50 MG 24 HR TABLET    TAKE 1 TABLET BY MOUTH DAILY *PATIENT NEEDS APPOINTMENT FOR ADDITIONAL REFILLS*   MIRTAZAPINE  (REMERON ) 15 MG TABLET    TAKE 1 TABLET BY MOUTH EACH EVENING   ROSUVASTATIN  (CRESTOR ) 10 MG TABLET    TAKE 1 TABLET BY MOUTH EVERY MORNING   SEVELAMER  CARBONATE (RENVELA  PO)    Take 1 packet by mouth See admin instructions. 1 packet with each meal and with snacks   SEVELAMER  CARBONATE (RENVELA ) 800 MG TABLET    Take 800 mg by mouth 3 (three) times daily with meals.   SUCROFERRIC OXYHYDROXIDE (VELPHORO) 500 MG CHEWABLE TABLET    Chew 500 mg by mouth 3 (three) times daily with meals. Patient sprinkles on her food  Modified Medications   Modified Medication Previous Medication   INSULIN  GLARGINE (LANTUS  SOLOSTAR) 100 UNIT/ML SOLOSTAR PEN insulin  glargine (LANTUS  SOLOSTAR) 100 UNIT/ML Solostar Pen      Inject 48 Units into the skin at bedtime.    Inject 38 Units into the skin at bedtime.   INSULIN  LISPRO (HUMALOG  KWIKPEN) 100 UNIT/ML KWIKPEN insulin  lispro (HUMALOG  KWIKPEN) 100 UNIT/ML KwikPen      Inject 6 Units into the skin 2 (two) times daily.    Inject 6 Units into the skin 2 (two) times daily.  Discontinued Medications   No medications on file    Allergies Allergies  Allergen Reactions   Penicillins Itching and Rash    Past Medical History Past Medical History:  Diagnosis Date   Allergic rhinitis    Anxiety    CHF (congestive heart failure) (HCC)    a. EF 30-35% by echo in 05/2020 b. EF at 45% in 09/2020   Chronic back pain    Chronic hepatitis C without hepatic coma (HCC)    COVID-19 virus infection 12/26/2019   Depression    Diabetes mellitus    Hepatitis C    Hypertension    Noncompliance    Poor appetite 07/17/2014   Substance abuse (HCC)    HX of drug use and alcohol use    Past Surgical History Past Surgical History:  Procedure Laterality Date  APPENDECTOMY  2004   AV FISTULA PLACEMENT Left 11/26/2020   Procedure: LEFT ARM ARTERIOVENOUS  (AV) FISTULA CREATION;  Surgeon: Mayo Speck, MD;  Location: [facility];  Service: Vascular;  Laterality: Left;   AV FISTULA PLACEMENT Left 06/03/2021   Procedure: INSERTION OF LEFT UPPER ARM ARTERIOVENOUS (AV) GORE-TEX GRAFT;  Surgeon: Mayo Speck, MD;  Location: [facility];  Service: Vascular;  Laterality: Left;   EXCHANGE OF A DIALYSIS CATHETER Right 02/24/2022   Procedure: EXCHANGE OF A DIALYSIS CATHETER;  Surgeon: Mayo Speck, MD;  Location: [facility];  Service: Vascular;  Laterality: Right;   IR FLUORO GUIDE CV LINE RIGHT  12/25/2020   IR US  GUIDE VASC ACCESS RIGHT  12/25/2020   LIGATION ARTERIOVENOUS GORTEX GRAFT Left 08/05/2021   Procedure: LIGATION OF LEFT ARM ARTERIOVENOUS GORTEX GRAFT;  Surgeon: Mayo Speck, MD;  Location: [facility];  Service: Vascular;  Laterality: Left;    Family History family history includes Diabetes in her sister.  Social History Social History   Socioeconomic History   Marital status: Single    Spouse name: Not on file   Number of children: 3   Years of education: Not on file   Highest education level: Not on file  Occupational History   Occupation: Disabled  Tobacco Use   Smoking status: Former    Current packs/day: 0.25    Types: Cigarettes    Passive exposure: Past   Smokeless tobacco: Never  Vaping Use   Vaping status: Never Used  Substance and Sexual Activity   Alcohol use: Not Currently    Alcohol/week: 40.0 standard drinks of alcohol    Types: 40 Cans of beer per week    Comment: 2 16oz cans of beer a day   Drug use: Not Currently    Types: Marijuana, Cocaine    Comment: cant rememeber last time used.   Sexual activity: Yes    Birth control/protection: Condom  Other Topics Concern   Not on file  Social History Narrative   Not on file   Social Drivers of Health   Financial Resource Strain: Low Risk  (07/26/2020)   Overall Financial Resource Strain (CARDIA)    Difficulty of Paying Living Expenses: Not hard at all  Food Insecurity: No  Food Insecurity (07/15/2023)   Hunger Vital Sign    Worried About Running Out of Food in the Last Year: Never true    Ran Out of Food in the Last Year: Never true  Recent Concern: Food Insecurity - Food Insecurity Present (06/28/2023)   Hunger Vital Sign    Worried About Running Out of Food in the Last Year: Sometimes true    Ran Out of Food in the Last Year: Never true  Transportation Needs: No Transportation Needs (07/15/2023)   PRAPARE - Administrator, Civil Service (Medical): No    Lack of Transportation (Non-Medical): No  Physical Activity: Inactive (12/19/2020)   Exercise Vital Sign    Days of Exercise per Week: 0 days    Minutes of Exercise per Session: 0 min  Stress: Stress Concern Present (08/21/2021)   [employer] of Occupational Health - Occupational Stress Questionnaire    Feeling of Stress : Rather much  Social Connections: Moderately Integrated (12/19/2020)   Social Connection and Isolation Panel [NHANES]    Frequency of Communication with Friends and Family: More than three times a week    Frequency of Social Gatherings with Friends and Family: Once a week  Attends Religious Services: More than 4 times per year    Active Member of Clubs or Organizations: Yes    Attends Club or Organization Meetings: More than 4 times per year    Marital Status: Separated  Intimate Partner Violence: Not At Risk (07/01/2023)   Humiliation, Afraid, Rape, and Kick questionnaire    Fear of Current or Ex-Partner: No    Emotionally Abused: No    Physically Abused: No    Sexually Abused: No    Lab Results  Component Value Date   HGBA1C 8.7 (A) 07/27/2023   HGBA1C 9.3 (H) 03/03/2023   HGBA1C 9.5 (H) 02/11/2023   Lab Results  Component Value Date   CHOL 109 03/03/2023   Lab Results  Component Value Date   HDL 22 (L) 03/03/2023   Lab Results  Component Value Date   LDLCALC 54 03/03/2023   Lab Results  Component Value Date   TRIG 163 (H) 03/03/2023   Lab Results   Component Value Date   CHOLHDL 5.0 03/03/2023   Lab Results  Component Value Date   CREATININE 8.86 (H) 03/25/2023   Lab Results  Component Value Date   GFR 7.11 (LL) 01/07/2023   Lab Results  Component Value Date   MICROALBUR 69.5 (H) 01/07/2023      Component Value Date/Time   NA 139 03/25/2023 1144   NA 135 (A) 04/30/2022 0000   K 5.2 03/25/2023 1144   CL 103 03/25/2023 1144   CO2 20 03/25/2023 1144   GLUCOSE 303 (H) 03/25/2023 1144   BUN 55 (H) 03/25/2023 1144   BUN 50 (A) 05/08/2022 0000   CREATININE 8.86 (H) 03/25/2023 1144   CALCIUM  8.9 03/25/2023 1144   PROT 7.4 03/01/2023 1639   PROT 6.2 05/02/2020 1535   ALBUMIN 3.2 (L) 03/03/2023 0544   ALBUMIN 3.3 (L) 05/02/2020 1535   AST 18 03/01/2023 1639   ALT 20 03/01/2023 1639   ALT 65 (H) 12/04/2013 1449   ALKPHOS 84 03/01/2023 1639   BILITOT 0.4 03/01/2023 1639   BILITOT <0.2 05/02/2020 1535   BILITOT 0.3 12/04/2013 1449   GFRNONAA 10 (L) 03/10/2023 2202   GFRNONAA 39 (L) 03/28/2019 1056   GFRAA 31 (L) 05/02/2020 1535   GFRAA 45 (L) 03/28/2019 1056      Latest Ref Rng & Units 03/25/2023   11:44 AM 03/10/2023   10:02 PM 03/03/2023    5:44 AM  BMP  Glucose 65 - 139 mg/dL 161  096  84   BUN 7 - 25 mg/dL 55  21  35   Creatinine 0.50 - 1.05 mg/dL 0.45  4.09  8.11   BUN/Creat Ratio 6 - 22 (calc) 6     Sodium 135 - 146 mmol/L 139  133  136   Potassium 3.5 - 5.3 mmol/L 5.2  4.0  4.7   Chloride 98 - 110 mmol/L 103  93  102   CO2 20 - 32 mmol/L 20  24  22    Calcium  8.6 - 10.4 mg/dL 8.9  9.3  8.9        Component Value Date/Time   WBC 8.6 03/25/2023 1144   RBC 3.57 (L) 03/25/2023 1144   HGB 11.2 (L) 03/25/2023 1144   HGB 10.5 (L) 01/05/2020 1132   HCT 34.2 (L) 03/25/2023 1144   HCT 31.9 (L) 01/05/2020 1132   PLT 263 03/25/2023 1144   PLT 191 01/05/2020 1132   MCV 95.8 03/25/2023 1144   MCV 90 01/05/2020 1132  MCH 31.4 03/25/2023 1144   MCHC 32.7 03/25/2023 1144   RDW 13.2 03/25/2023 1144   RDW 12.7  01/05/2020 1132   LYMPHSABS 2.2 03/10/2023 2202   MONOABS 0.9 03/10/2023 2202   EOSABS 310 03/25/2023 1144   BASOSABS 103 03/25/2023 1144     Parts of this note may have been dictated using voice recognition software. There may be variances in spelling and vocabulary which are unintentional. Not all errors are proofread. Please notify the [facility] if any discrepancies are noted or if the meaning of any statement is not clear.

## 2023-07-27 NOTE — Patient Instructions (Signed)

## 2023-07-28 ENCOUNTER — Other Ambulatory Visit: Payer: Self-pay

## 2023-07-28 DIAGNOSIS — N186 End stage renal disease: Secondary | ICD-10-CM

## 2023-07-29 DIAGNOSIS — N186 End stage renal disease: Secondary | ICD-10-CM | POA: Diagnosis not present

## 2023-07-31 DIAGNOSIS — N186 End stage renal disease: Secondary | ICD-10-CM | POA: Diagnosis not present

## 2023-08-03 DIAGNOSIS — N186 End stage renal disease: Secondary | ICD-10-CM | POA: Diagnosis not present

## 2023-08-04 NOTE — Progress Notes (Unsigned)
 Office Note     CC:  ESRD Requesting Provider:  Patrick Boor, MD  HPI: Latoya Walsh is a {Handed:22697} handed 63 y.o. (26-Aug-1960) female with kidney disease who presents at the request of Patrick Boor, MD for permanent HD access. The patient has had  prior access procedures. Per pt, previous tunneled lines have been placed in ***. Current access is ***. Dialysis days are ***.   Prior access procedures include  - left arm AV graft with subsequent ligation for steal syndrome 2023 - Left radiocephalic AV fistula 2022  On exam, ***   The pt is *** on a statin for cholesterol management.  The pt is *** on a daily aspirin .   Other AC:  *** The pt is *** on medications for hypertension.   The pt is *** diabetic. Tobacco hx:  ***  Past Medical History:  Diagnosis Date   Allergic rhinitis    Anxiety    CHF (congestive heart failure) (HCC)    a. EF 30-35% by echo in 05/2020 b. EF at 45% in 09/2020   Chronic back pain    Chronic hepatitis C without hepatic coma (HCC)    COVID-19 virus infection 12/26/2019   Depression    Diabetes mellitus    Hepatitis C    Hypertension    Noncompliance    Poor appetite 07/17/2014   Substance abuse (HCC)    HX of drug use and alcohol use    Past Surgical History:  Procedure Laterality Date   APPENDECTOMY  2004   AV FISTULA PLACEMENT Left 11/26/2020   Procedure: LEFT ARM ARTERIOVENOUS (AV) FISTULA CREATION;  Surgeon: Mayo Speck, MD;  Location: [facility];  Service: Vascular;  Laterality: Left;   AV FISTULA PLACEMENT Left 06/03/2021   Procedure: INSERTION OF LEFT UPPER ARM ARTERIOVENOUS (AV) GORE-TEX GRAFT;  Surgeon: Mayo Speck, MD;  Location: [facility];  Service: Vascular;  Laterality: Left;   EXCHANGE OF A DIALYSIS CATHETER Right 02/24/2022   Procedure: EXCHANGE OF A DIALYSIS CATHETER;  Surgeon: Mayo Speck, MD;  Location: [facility];  Service: Vascular;  Laterality: Right;   IR FLUORO GUIDE CV LINE RIGHT  12/25/2020   IR US  GUIDE VASC ACCESS  RIGHT  12/25/2020   LIGATION ARTERIOVENOUS GORTEX GRAFT Left 08/05/2021   Procedure: LIGATION OF LEFT ARM ARTERIOVENOUS GORTEX GRAFT;  Surgeon: Mayo Speck, MD;  Location: [facility];  Service: Vascular;  Laterality: Left;    Social History   Socioeconomic History   Marital status: Single    Spouse name: Not on file   Number of children: 3   Years of education: Not on file   Highest education level: Not on file  Occupational History   Occupation: Disabled  Tobacco Use   Smoking status: Former    Current packs/day: 0.25    Types: Cigarettes    Passive exposure: Past   Smokeless tobacco: Never  Vaping Use   Vaping status: Never Used  Substance and Sexual Activity   Alcohol use: Not Currently    Alcohol/week: 40.0 standard drinks of alcohol    Types: 40 Cans of beer per week    Comment: 2 16oz cans of beer a day   Drug use: Not Currently    Types: Marijuana, Cocaine    Comment: cant rememeber last time used.   Sexual activity: Yes    Birth control/protection: Condom  Other Topics Concern   Not on file  Social History Narrative   Not on  file   Social Drivers of Health   Financial Resource Strain: Low Risk  (07/26/2020)   Overall Financial Resource Strain (CARDIA)    Difficulty of Paying Living Expenses: Not hard at all  Food Insecurity: No Food Insecurity (07/15/2023)   Hunger Vital Sign    Worried About Running Out of Food in the Last Year: Never true    Ran Out of Food in the Last Year: Never true  Recent Concern: Food Insecurity - Food Insecurity Present (06/28/2023)   Hunger Vital Sign    Worried About Running Out of Food in the Last Year: Sometimes true    Ran Out of Food in the Last Year: Never true  Transportation Needs: No Transportation Needs (07/15/2023)   PRAPARE - Administrator, Civil Service (Medical): No    Lack of Transportation (Non-Medical): No  Physical Activity: Inactive (12/19/2020)   Exercise Vital Sign    Days of Exercise per Week: 0 days     Minutes of Exercise per Session: 0 min  Stress: Stress Concern Present (08/21/2021)   [employer] of Occupational Health - Occupational Stress Questionnaire    Feeling of Stress : Rather much  Social Connections: Moderately Integrated (12/19/2020)   Social Connection and Isolation Panel [NHANES]    Frequency of Communication with Friends and Family: More than three times a week    Frequency of Social Gatherings with Friends and Family: Once a week    Attends Religious Services: More than 4 times per year    Active Member of [employer] or Organizations: Yes    Attends Banker Meetings: More than 4 times per year    Marital Status: Separated  Intimate Partner Violence: Not At Risk (07/01/2023)   Humiliation, Afraid, Rape, and Kick questionnaire    Fear of Current or Ex-Partner: No    Emotionally Abused: No    Physically Abused: No    Sexually Abused: No   *** Family History  Problem Relation Age of Onset   Diabetes Sister        Twin - AIDS     Current Outpatient Medications  Medication Sig Dispense Refill   albuterol  (VENTOLIN  HFA) 108 (90 Base) MCG/ACT inhaler Inhale 2 puffs into the lungs every 4 (four) hours as needed for wheezing or shortness of breath. 18 g 1   amLODipine  (NORVASC ) 10 MG tablet Take 10 mg by mouth every morning.     aspirin  EC 81 MG tablet Take 1 tablet (81 mg total) by mouth daily. Swallow whole. 30 tablet 12   Blood Glucose Monitoring Suppl (ONETOUCH VERIO FLEX SYSTEM) w/Device KIT USE TO TEST BLOOD SUGAR TWICE DAILY AS DIRECTED *REFILL REQUEST* 1 kit 10   busPIRone  (BUSPAR ) 5 MG tablet Take 1 tablet (5 mg total) by mouth 2 (two) times daily. 60 tablet 3   COMFORT EZ PEN NEEDLES 32G X 4 MM MISC USE AS DIRECTED with insulin  injections 100 each 1   Continuous Glucose Receiver (FREESTYLE LIBRE 3 READER) DEVI 1 Device by Does not apply route continuous. 1 each 0   Continuous Glucose Sensor (FREESTYLE LIBRE 3 PLUS SENSOR) MISC Change sensor every 15  days. 6 each 3   Continuous Glucose Sensor (FREESTYLE LIBRE 3 SENSOR) MISC 1 Device by Does not apply route continuous. Place 1 sensor on the skin every 14 days. Use to check glucose continuously (Patient not taking: Reported on 07/27/2023) 6 each 3   D3-1000 25 MCG (1000 UT) capsule Take 1,000 Units by mouth  daily.     docusate sodium  (COLACE) 100 MG capsule Take 1 capsule (100 mg total) by mouth daily as needed for mild constipation. 10 capsule 5   insulin  glargine (LANTUS  SOLOSTAR) 100 UNIT/ML Solostar Pen Inject 48 Units into the skin at bedtime. 15 mL 2   insulin  lispro (HUMALOG  KWIKPEN) 100 UNIT/ML KwikPen Inject 6 Units into the skin 2 (two) times daily. 15 mL 3   Insulin  Pen Needle (PEN NEEDLES) 31G X 5 MM MISC Use to inject insulin  once daily 90 each 3   Iron , Ferrous Sulfate , 325 (65 Fe) MG TABS Take 325 mg by mouth daily. 30 tablet 3   losartan  (COZAAR ) 50 MG tablet Take 50 mg by mouth daily.     metoprolol  succinate (TOPROL -XL) 50 MG 24 hr tablet TAKE 1 TABLET BY MOUTH DAILY *PATIENT NEEDS APPOINTMENT FOR ADDITIONAL REFILLS* 30 tablet 0   mirtazapine  (REMERON ) 15 MG tablet TAKE 1 TABLET BY MOUTH EACH EVENING 30 tablet 10   rosuvastatin  (CRESTOR ) 10 MG tablet TAKE 1 TABLET BY MOUTH EVERY MORNING 90 tablet 3   Sevelamer  Carbonate (RENVELA  PO) Take 1 packet by mouth See admin instructions. 1 packet with each meal and with snacks     sevelamer  carbonate (RENVELA ) 800 MG tablet Take 800 mg by mouth 3 (three) times daily with meals.     sucroferric oxyhydroxide (VELPHORO) 500 MG chewable tablet Chew 500 mg by mouth 3 (three) times daily with meals. Patient sprinkles on her food     No current facility-administered medications for this visit.    Allergies  Allergen Reactions   Penicillins Itching and Rash     REVIEW OF SYSTEMS:  *** [X]  denotes positive finding, [ ]  denotes negative finding Cardiac  Comments:  Chest pain or chest pressure:    Shortness of breath upon exertion:     Short of breath when lying flat:    Irregular heart rhythm:        Vascular    Pain in calf, thigh, or hip brought on by ambulation:    Pain in feet at night that wakes you up from your sleep:     Blood clot in your veins:    Leg swelling:         Pulmonary    Oxygen  at home:    Productive cough:     Wheezing:         Neurologic    Sudden weakness in arms or legs:     Sudden numbness in arms or legs:     Sudden onset of difficulty speaking or slurred speech:    Temporary loss of vision in one eye:     Problems with dizziness:         Gastrointestinal    Blood in stool:     Vomited blood:         Genitourinary    Burning when urinating:     Blood in urine:        Psychiatric    Major depression:         Hematologic    Bleeding problems:    Problems with blood clotting too easily:        Skin    Rashes or ulcers:        Constitutional    Fever or chills:      PHYSICAL EXAMINATION:  There were no vitals filed for this visit.  General:  WDWN in NAD; vital signs documented above Gait: Not observed HENT: WNL,  normocephalic Pulmonary: normal non-labored breathing , without Rales, rhonchi,  wheezing Cardiac: {Desc; regular/irreg:14544} HR, without  Murmurs {With/Without:20273} carotid bruit*** Abdomen: soft, NT, no masses Skin: {With/Without:20273} rashes Vascular Exam/Pulses:  Right Left  Radial {Exam; arterial pulse strength 0-4:30167} {Exam; arterial pulse strength 0-4:30167}  Ulnar {Exam; arterial pulse strength 0-4:30167} {Exam; arterial pulse strength 0-4:30167}  Femoral {Exam; arterial pulse strength 0-4:30167} {Exam; arterial pulse strength 0-4:30167}  Popliteal {Exam; arterial pulse strength 0-4:30167} {Exam; arterial pulse strength 0-4:30167}  DP {Exam; arterial pulse strength 0-4:30167} {Exam; arterial pulse strength 0-4:30167}  PT {Exam; arterial pulse strength 0-4:30167} {Exam; arterial pulse strength 0-4:30167}   Extremities: {With/Without:20273}  ischemic changes, {With/Without:20273} Gangrene , {With/Without:20273} cellulitis; {With/Without:20273} open wounds;  Musculoskeletal: no muscle wasting or atrophy  Neurologic: A&O X 3;  No focal weakness or paresthesias are detected Psychiatric:  The pt has {Desc; normal/abnormal:11317::"Normal"} affect.   Non-Invasive Vascular Imaging:   ***    ASSESSMENT/PLAN:  NATISHIA GELBER is a 63 y.o. female who presents with {KidneyDisease:19197::"end stage renal disease","chronic kidney disease stage ***"}  Based on vein mapping and examination, ***. I had an extensive discussion with this patient in regards to the nature of access surgery, including risk, benefits, and alternatives.   The patient is aware that the risks of access surgery include but are not limited to: bleeding, infection, steal syndrome, nerve damage, ischemic monomelic neuropathy, failure of access to mature, complications related to venous hypertension, and possible need for additional access procedures in the future. *** I discussed with the patient the nature of the staged access procedure, specifically the need for a second operation to transpose the first stage fistula if it matures adequately.   The patient has *** agreed to proceed with the above procedure which will be scheduled ***.  Kayla Part, MD [facility] [phone]

## 2023-08-05 ENCOUNTER — Encounter: Payer: Self-pay | Admitting: Vascular Surgery

## 2023-08-05 ENCOUNTER — Ambulatory Visit: Admitting: Vascular Surgery

## 2023-08-05 ENCOUNTER — Ambulatory Visit (HOSPITAL_BASED_OUTPATIENT_CLINIC_OR_DEPARTMENT_OTHER)
Admission: RE | Admit: 2023-08-05 | Discharge: 2023-08-05 | Disposition: A | Discharge status: Home / Self Care | Source: Ambulatory Visit | Attending: Vascular Surgery | Admitting: Vascular Surgery

## 2023-08-05 ENCOUNTER — Ambulatory Visit (HOSPITAL_COMMUNITY)
Admission: RE | Admit: 2023-08-05 | Discharge: 2023-08-05 | Disposition: A | Discharge status: Home / Self Care | Source: Ambulatory Visit | Attending: Vascular Surgery | Admitting: Vascular Surgery

## 2023-08-05 VITALS — BP 145/83 | HR 80 | Temp 97.9°F | Resp 18 | Ht 67.0 in | Wt 168.1 lb

## 2023-08-05 DIAGNOSIS — N186 End stage renal disease: Secondary | ICD-10-CM | POA: Insufficient documentation

## 2023-08-05 DIAGNOSIS — Z992 Dependence on renal dialysis: Secondary | ICD-10-CM

## 2023-08-07 ENCOUNTER — Other Ambulatory Visit: Payer: Self-pay | Admitting: Cardiology

## 2023-08-07 DIAGNOSIS — N186 End stage renal disease: Secondary | ICD-10-CM | POA: Diagnosis not present

## 2023-08-10 DIAGNOSIS — N186 End stage renal disease: Secondary | ICD-10-CM | POA: Diagnosis not present

## 2023-08-12 ENCOUNTER — Telehealth: Payer: Self-pay

## 2023-08-12 DIAGNOSIS — N186 End stage renal disease: Secondary | ICD-10-CM | POA: Diagnosis not present

## 2023-08-14 DIAGNOSIS — N186 End stage renal disease: Secondary | ICD-10-CM | POA: Diagnosis not present

## 2023-08-17 DIAGNOSIS — N186 End stage renal disease: Secondary | ICD-10-CM | POA: Diagnosis not present

## 2023-08-18 ENCOUNTER — Telehealth: Payer: Self-pay | Admitting: Pharmacy Technician

## 2023-08-18 ENCOUNTER — Other Ambulatory Visit (HOSPITAL_COMMUNITY): Payer: Self-pay

## 2023-08-18 NOTE — Telephone Encounter (Signed)
 Pharmacy Patient Advocate Encounter   Received notification from CoverMyMeds that prior authorization for FreeStyle Libre 3 Plus Sensor is required/requested.   Insurance verification completed.   The patient is insured through [facility] MEDICAID .   Per test claim: PA required; PA submitted to above mentioned insurance via CoverMyMeds Key/confirmation #/EOC [id number] Status is pending

## 2023-08-18 NOTE — Telephone Encounter (Signed)
 Pharmacy Patient Advocate Encounter  Received notification from [facility] MEDICAID that Prior Authorization for FreeStyle Libre 3 Plus Sensor has been APPROVED from 08/18/2023 to 02/18/2024. Unable to obtain price due to refill too soon rejection, last fill date 08/18/2023 next available fill date06/20/2025   PA #/Case ID/Reference #: [id number]

## 2023-08-18 NOTE — Telephone Encounter (Signed)
 PA approved with [employer].

## 2023-08-19 DIAGNOSIS — N186 End stage renal disease: Secondary | ICD-10-CM | POA: Diagnosis not present

## 2023-08-21 DIAGNOSIS — E1122 Type 2 diabetes mellitus with diabetic chronic kidney disease: Secondary | ICD-10-CM | POA: Diagnosis not present

## 2023-08-21 DIAGNOSIS — Z992 Dependence on renal dialysis: Secondary | ICD-10-CM | POA: Diagnosis not present

## 2023-08-21 DIAGNOSIS — N186 End stage renal disease: Secondary | ICD-10-CM | POA: Diagnosis not present

## 2023-08-24 ENCOUNTER — Other Ambulatory Visit: Payer: Medicaid Other

## 2023-08-24 ENCOUNTER — Encounter: Payer: Self-pay | Admitting: Family

## 2023-08-24 ENCOUNTER — Ambulatory Visit (INDEPENDENT_AMBULATORY_CARE_PROVIDER_SITE_OTHER): Admitting: Family

## 2023-08-24 VITALS — BP 142/80 | HR 78 | Temp 96.7°F | Ht 67.0 in | Wt 171.6 lb

## 2023-08-24 DIAGNOSIS — E1165 Type 2 diabetes mellitus with hyperglycemia: Secondary | ICD-10-CM | POA: Diagnosis not present

## 2023-08-24 DIAGNOSIS — N186 End stage renal disease: Secondary | ICD-10-CM | POA: Diagnosis not present

## 2023-08-24 DIAGNOSIS — F322 Major depressive disorder, single episode, severe without psychotic features: Secondary | ICD-10-CM

## 2023-08-24 DIAGNOSIS — I120 Hypertensive chronic kidney disease with stage 5 chronic kidney disease or end stage renal disease: Secondary | ICD-10-CM

## 2023-08-24 DIAGNOSIS — E785 Hyperlipidemia, unspecified: Secondary | ICD-10-CM

## 2023-08-24 DIAGNOSIS — J4520 Mild intermittent asthma, uncomplicated: Secondary | ICD-10-CM | POA: Diagnosis not present

## 2023-08-24 DIAGNOSIS — I5022 Chronic systolic (congestive) heart failure: Secondary | ICD-10-CM | POA: Diagnosis not present

## 2023-08-24 DIAGNOSIS — D631 Anemia in chronic kidney disease: Secondary | ICD-10-CM

## 2023-08-24 DIAGNOSIS — F411 Generalized anxiety disorder: Secondary | ICD-10-CM | POA: Diagnosis not present

## 2023-08-24 MED ORDER — ALBUTEROL SULFATE HFA 108 (90 BASE) MCG/ACT IN AERS: 2.0000 | INHALATION_SPRAY | RESPIRATORY_TRACT | 1 refills | Status: AC | PRN

## 2023-08-24 MED ORDER — LOSARTAN POTASSIUM 50 MG PO TABS: 75.0000 mg | ORAL_TABLET | Freq: Every day | ORAL | 1 refills | Status: DC

## 2023-08-24 MED ORDER — INSULIN LISPRO (1 UNIT DIAL) 100 UNIT/ML (KWIKPEN): 6.0000 [IU] | PEN_INJECTOR | Freq: Two times a day (BID) | SUBCUTANEOUS | 3 refills | Status: AC

## 2023-08-24 MED ORDER — FREESTYLE LIBRE 3 READER DEVI: 1.0000 | 0 refills | Status: DC

## 2023-08-24 MED ORDER — INSULIN LISPRO (1 UNIT DIAL) 100 UNIT/ML (KWIKPEN): 6.0000 [IU] | PEN_INJECTOR | Freq: Two times a day (BID) | SUBCUTANEOUS | 3 refills | Status: DC

## 2023-08-24 MED ORDER — ALBUTEROL SULFATE HFA 108 (90 BASE) MCG/ACT IN AERS
2.0000 | INHALATION_SPRAY | RESPIRATORY_TRACT | 1 refills | Status: DC | PRN
Start: 2023-08-24 — End: 2023-08-24

## 2023-08-24 MED ORDER — FREESTYLE LIBRE 3 READER DEVI: 1.0000 | 0 refills | Status: AC

## 2023-08-24 MED ORDER — FREESTYLE LIBRE 3 PLUS SENSOR MISC: 3 refills | Status: DC

## 2023-08-24 MED ORDER — FREESTYLE LIBRE 3 PLUS SENSOR MISC: 3 refills | Status: AC

## 2023-08-25 ENCOUNTER — Ambulatory Visit: Admitting: "Endocrinology

## 2023-08-26 ENCOUNTER — Ambulatory Visit: Payer: Medicaid Other | Admitting: Family

## 2023-08-26 ENCOUNTER — Ambulatory Visit: Admitting: "Endocrinology

## 2023-08-26 DIAGNOSIS — N186 End stage renal disease: Secondary | ICD-10-CM | POA: Diagnosis not present

## 2023-08-28 DIAGNOSIS — N186 End stage renal disease: Secondary | ICD-10-CM | POA: Diagnosis not present
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
## 2023-08-31 ENCOUNTER — Ambulatory Visit (INDEPENDENT_AMBULATORY_CARE_PROVIDER_SITE_OTHER): Admitting: Podiatry

## 2023-08-31 DIAGNOSIS — N186 End stage renal disease: Secondary | ICD-10-CM | POA: Diagnosis not present

## 2023-08-31 DIAGNOSIS — M79672 Pain in left foot: Secondary | ICD-10-CM | POA: Diagnosis not present

## 2023-08-31 DIAGNOSIS — M79671 Pain in right foot: Secondary | ICD-10-CM

## 2023-08-31 DIAGNOSIS — B351 Tinea unguium: Secondary | ICD-10-CM | POA: Diagnosis not present

## 2023-08-31 DIAGNOSIS — L600 Ingrowing nail: Secondary | ICD-10-CM | POA: Diagnosis not present

## 2023-08-31 NOTE — Progress Notes (Signed)
 Patient presents for evaluation and treatment of tenderness and some redness around nails feet.  Tenderness around toes with walking and wearing shoes.  Physical exam:  General appearance: Alert, pleasant, and in no acute distress.  Vascular: Pedal pulses: DP 2/4 B/L, PT 1/4 B/L.  Minimal edema lower legs bilaterally  Neurological:  No paresthesias or burning noted.  Dermatologic:  Nails thickened, disfigured, discolored 1-5 BL with subungual debris.  Redness and hypertrophic nail folds along nail folds bilaterally but no signs of drainage or infection.  Musculoskeletal:     Diagnosis: 1. Painful onychomycotic nails 1 through 5 bilaterally. 2. Pain toes 1 through 5 bilaterally. 3.  Ingrown nails bilaterally  Plan: Debrided onychomycotic nails 1 through 5 bilaterally.  Return 3 months

## 2023-09-02 ENCOUNTER — Other Ambulatory Visit

## 2023-09-02 DIAGNOSIS — Z992 Dependence on renal dialysis: Secondary | ICD-10-CM | POA: Diagnosis not present

## 2023-09-02 DIAGNOSIS — I120 Hypertensive chronic kidney disease with stage 5 chronic kidney disease or end stage renal disease: Secondary | ICD-10-CM | POA: Diagnosis not present

## 2023-09-02 DIAGNOSIS — D631 Anemia in chronic kidney disease: Secondary | ICD-10-CM | POA: Diagnosis not present

## 2023-09-02 DIAGNOSIS — E785 Hyperlipidemia, unspecified: Secondary | ICD-10-CM | POA: Diagnosis not present

## 2023-09-02 DIAGNOSIS — N186 End stage renal disease: Secondary | ICD-10-CM | POA: Diagnosis not present

## 2023-09-02 DIAGNOSIS — F411 Generalized anxiety disorder: Secondary | ICD-10-CM | POA: Diagnosis not present

## 2023-09-02 LAB — CBC WITH DIFFERENTIAL/PLATELET
Absolute Lymphocytes: 2195 {cells}/uL (ref 850–3900)
Absolute Monocytes: 339 {cells}/uL (ref 200–950)
Basophils Absolute: 51 {cells}/uL (ref 0–200)
Basophils Relative: 0.8 %
Eosinophils Absolute: 141 {cells}/uL (ref 15–500)
Eosinophils Relative: 2.2 %
HCT: 37.7 % (ref 35.0–45.0)
Hemoglobin: 12 g/dL (ref 11.7–15.5)
MCH: 31.1 pg (ref 27.0–33.0)
MCHC: 31.8 g/dL — ABNORMAL LOW (ref 32.0–36.0)
MCV: 97.7 fL (ref 80.0–100.0)
MPV: 11.8 fL (ref 7.5–12.5)
Monocytes Relative: 5.3 %
Neutro Abs: 3674 {cells}/uL (ref 1500–7800)
Neutrophils Relative %: 57.4 %
Platelets: 210 10*3/uL (ref 140–400)
RBC: 3.86 10*6/uL (ref 3.80–5.10)
RDW: 13.3 % (ref 11.0–15.0)
Total Lymphocyte: 34.3 %
WBC: 6.4 10*3/uL (ref 3.8–10.8)

## 2023-09-02 LAB — LIPID PANEL
Cholesterol: 121 mg/dL (ref <200)
HDL: 35 mg/dL — ABNORMAL LOW (ref >=50)
LDL Cholesterol (Calc): 57 mg/dL
Non-HDL Cholesterol (Calc): 86 mg/dL (ref <130)
Total CHOL/HDL Ratio: 3.5 (calc) (ref <5.0)
Triglycerides: 220 mg/dL — ABNORMAL HIGH (ref <150)

## 2023-09-03 ENCOUNTER — Ambulatory Visit: Payer: Self-pay | Admitting: Family

## 2023-09-03 LAB — HEPATIC FUNCTION PANEL
AG Ratio: 1.3 (calc) (ref 1.0–2.5)
ALT: 18 U/L (ref 6–29)
AST: 12 U/L (ref 10–35)
Albumin: 4.4 g/dL (ref 3.6–5.1)
Alkaline phosphatase (APISO): 115 U/L (ref 37–153)
Bilirubin, Direct: 0.1 mg/dL (ref 0.0–0.2)
Globulin: 3.4 g/dL (ref 1.9–3.7)
Indirect Bilirubin: 0.5 mg/dL (ref 0.2–1.2)
Total Bilirubin: 0.6 mg/dL (ref 0.2–1.2)
Total Protein: 7.8 g/dL (ref 6.1–8.1)

## 2023-09-04 DIAGNOSIS — N186 End stage renal disease: Secondary | ICD-10-CM | POA: Diagnosis not present

## 2023-09-06 ENCOUNTER — Other Ambulatory Visit: Payer: Self-pay | Admitting: Cardiology

## 2023-09-07 ENCOUNTER — Ambulatory Visit: Admitting: Family

## 2023-09-07 ENCOUNTER — Telehealth: Payer: Self-pay

## 2023-09-07 DIAGNOSIS — N186 End stage renal disease: Secondary | ICD-10-CM | POA: Diagnosis not present

## 2023-09-09 DIAGNOSIS — N186 End stage renal disease: Secondary | ICD-10-CM | POA: Diagnosis not present

## 2023-09-11 DIAGNOSIS — N186 End stage renal disease: Secondary | ICD-10-CM | POA: Diagnosis not present

## 2023-09-13 DIAGNOSIS — N186 End stage renal disease: Secondary | ICD-10-CM | POA: Diagnosis not present

## 2023-09-14 DIAGNOSIS — N186 End stage renal disease: Secondary | ICD-10-CM | POA: Diagnosis not present

## 2023-09-15 ENCOUNTER — Other Ambulatory Visit: Payer: Self-pay | Admitting: Family

## 2023-09-15 DIAGNOSIS — F411 Generalized anxiety disorder: Secondary | ICD-10-CM

## 2023-09-15 DIAGNOSIS — N186 End stage renal disease: Secondary | ICD-10-CM | POA: Diagnosis not present

## 2023-09-16 DIAGNOSIS — N186 End stage renal disease: Secondary | ICD-10-CM | POA: Diagnosis not present

## 2023-09-17 DIAGNOSIS — N186 End stage renal disease: Secondary | ICD-10-CM | POA: Diagnosis not present

## 2023-09-18 DIAGNOSIS — N186 End stage renal disease: Secondary | ICD-10-CM | POA: Diagnosis not present

## 2023-09-19 DIAGNOSIS — N186 End stage renal disease: Secondary | ICD-10-CM | POA: Diagnosis not present

## 2023-09-20 DIAGNOSIS — N186 End stage renal disease: Secondary | ICD-10-CM | POA: Diagnosis not present

## 2023-09-20 DIAGNOSIS — E1122 Type 2 diabetes mellitus with diabetic chronic kidney disease: Secondary | ICD-10-CM | POA: Diagnosis not present

## 2023-09-20 DIAGNOSIS — Z992 Dependence on renal dialysis: Secondary | ICD-10-CM | POA: Diagnosis not present

## 2023-09-21 DIAGNOSIS — N186 End stage renal disease: Secondary | ICD-10-CM | POA: Diagnosis not present

## 2023-09-22 DIAGNOSIS — N186 End stage renal disease: Secondary | ICD-10-CM | POA: Diagnosis not present

## 2023-09-23 DIAGNOSIS — N186 End stage renal disease: Secondary | ICD-10-CM | POA: Diagnosis not present

## 2023-09-24 DIAGNOSIS — N186 End stage renal disease: Secondary | ICD-10-CM | POA: Diagnosis not present

## 2023-09-25 DIAGNOSIS — N186 End stage renal disease: Secondary | ICD-10-CM | POA: Diagnosis not present

## 2023-09-26 DIAGNOSIS — N186 End stage renal disease: Secondary | ICD-10-CM | POA: Diagnosis not present

## 2023-09-27 ENCOUNTER — Other Ambulatory Visit: Payer: Self-pay | Admitting: Family

## 2023-09-27 DIAGNOSIS — E1165 Type 2 diabetes mellitus with hyperglycemia: Secondary | ICD-10-CM

## 2023-09-27 DIAGNOSIS — N186 End stage renal disease: Secondary | ICD-10-CM | POA: Diagnosis not present

## 2023-09-27 NOTE — Telephone Encounter (Signed)
 Dose change.

## 2023-09-28 ENCOUNTER — Other Ambulatory Visit: Payer: Self-pay | Admitting: Family

## 2023-09-28 DIAGNOSIS — E1165 Type 2 diabetes mellitus with hyperglycemia: Secondary | ICD-10-CM

## 2023-09-28 DIAGNOSIS — N186 End stage renal disease: Secondary | ICD-10-CM | POA: Diagnosis not present

## 2023-09-29 DIAGNOSIS — N186 End stage renal disease: Secondary | ICD-10-CM | POA: Diagnosis not present

## 2023-09-30 ENCOUNTER — Telehealth: Payer: Self-pay | Admitting: Family

## 2023-09-30 DIAGNOSIS — N186 End stage renal disease: Secondary | ICD-10-CM | POA: Diagnosis not present

## 2023-09-30 NOTE — Telephone Encounter (Signed)
 Please make sure this okay to wait for dinah to get back. Otherwise will need to be seen to complete

## 2023-09-30 NOTE — Telephone Encounter (Signed)
 Patient sister dropped off a form for Medical Dental Clearance that need to be completed and returned. Given to [facility] in [place].

## 2023-09-30 NOTE — Telephone Encounter (Signed)
 Left a detail voicemail for the patient daughter called in regards to paperwork for medical clearance for dental treatment. Just spoke with patient sister and she has made an appointment with Darlean Maus, NP  on July 16 th and will leave paper work in [place], [place], NP  mailbox to be signed. Patient sister stated that the patient needs this done dental work done ASAP but I advised the patient she needs to be seen in office.   Message sent [name], Roxan BROCKS, NP

## 2023-10-01 DIAGNOSIS — N186 End stage renal disease: Secondary | ICD-10-CM | POA: Diagnosis not present

## 2023-10-01 NOTE — Telephone Encounter (Signed)
 Noted

## 2023-10-02 DIAGNOSIS — N186 End stage renal disease: Secondary | ICD-10-CM | POA: Diagnosis not present

## 2023-10-03 DIAGNOSIS — N186 End stage renal disease: Secondary | ICD-10-CM | POA: Diagnosis not present

## 2023-10-04 DIAGNOSIS — N186 End stage renal disease: Secondary | ICD-10-CM | POA: Diagnosis not present

## 2023-10-05 DIAGNOSIS — N186 End stage renal disease: Secondary | ICD-10-CM | POA: Diagnosis not present

## 2023-10-05 NOTE — Progress Notes (Unsigned)
 Location:  [facility] clinic  Provider:   Code Goals of Care:     03/10/2023    9:15 PM  Advanced Directives  Does Patient Have a Medical Advance Directive? No  Would patient like information on creating a medical advance directive? No - Patient declined     Chief Complaint  Patient presents with   Medical Clearance for  Dental Treatment    HPI:   [facility] sent over paperwork for clearance for dental extractions.  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 EF 30-35% by echo in 05/2020 b. EF at 45% in 09/2020   Chronic back pain    Chronic hepatitis C without hepatic coma (HCC)    COVID-19 virus infection 12/26/2019   Depression    Diabetes mellitus    Hepatitis C    Hypertension    Noncompliance    Poor appetite 07/17/2014   Substance abuse (HCC)    HX of drug use and alcohol use    Past Surgical History:  Procedure Laterality Date   APPENDECTOMY  2004   AV FISTULA PLACEMENT Left 11/26/2020   Procedure: LEFT ARM ARTERIOVENOUS (AV) FISTULA CREATION;  Surgeon: Oris Krystal FALCON, MD;  Location: [facility];  Service: Vascular;  Laterality: Left;   AV FISTULA PLACEMENT Left 06/03/2021   Procedure: INSERTION OF LEFT UPPER ARM ARTERIOVENOUS (AV) GORE-TEX GRAFT;  Surgeon: Oris Krystal FALCON, MD;  Location: [facility];  Service: Vascular;  Laterality: Left;   EXCHANGE OF A DIALYSIS CATHETER Right 02/24/2022   Procedure: EXCHANGE OF A DIALYSIS CATHETER;  Surgeon: Oris Krystal FALCON, MD;  Location: [facility];  Service: Vascular;  Laterality: Right;   IR FLUORO GUIDE CV LINE RIGHT  12/25/2020   IR US  GUIDE VASC ACCESS RIGHT  12/25/2020   LIGATION ARTERIOVENOUS GORTEX GRAFT Left 08/05/2021   Procedure: LIGATION OF LEFT ARM ARTERIOVENOUS GORTEX GRAFT;  Surgeon: Oris Krystal FALCON, MD;  Location: [facility];   Service: Vascular;  Laterality: Left;    Allergies  Allergen Reactions   Penicillins Itching and Rash    Outpatient Encounter Medications as of 10/06/2023  Medication Sig   albuterol  (VENTOLIN  HFA) 108 (90 Base) MCG/ACT inhaler Inhale 2 puffs into the lungs every 4 (four) hours as needed for wheezing or shortness of breath.   amLODipine  (NORVASC ) 10 MG tablet Take 10 mg by mouth every morning. (Patient not taking: Reported on 08/31/2023)   aspirin  EC 81 MG tablet Take 1 tablet (81 mg total) by mouth daily. Swallow whole.   Blood Glucose Monitoring Suppl (ONETOUCH VERIO FLEX SYSTEM) w/Device KIT USE TO TEST BLOOD SUGAR TWICE DAILY AS DIRECTED *REFILL REQUEST*   busPIRone  (BUSPAR ) 5 MG tablet TAKE ONE TABLET BY MOUTH TWICE DAILY   COMFORT EZ PEN NEEDLES 32G X 4 MM MISC USE AS DIRECTED with insulin  injections   Continuous Glucose Receiver (FREESTYLE LIBRE 3 READER) DEVI 1 Device by Does not apply route continuous.   Continuous Glucose Sensor (FREESTYLE LIBRE 3 PLUS SENSOR) MISC Change sensor every 15 days.   Continuous Glucose Sensor (FREESTYLE LIBRE 3 SENSOR) MISC 1 Device by Does not apply route continuous. Place 1 sensor on the skin every 14 days. Use to check glucose continuously   D3-1000 25  MCG (1000 UT) capsule Take 1,000 Units by mouth daily.   docusate sodium  (COLACE) 100 MG capsule Take 1 capsule (100 mg total) by mouth daily as needed for mild constipation.   insulin  glargine (LANTUS  SOLOSTAR) 100 UNIT/ML Solostar Pen Inject 48 Units into the skin at bedtime. (Patient taking differently: Inject 38 Units into the skin at bedtime.)   insulin  lispro (HUMALOG  KWIKPEN) 100 UNIT/ML KwikPen Inject 6 Units into the skin 2 (two) times daily.   Insulin  Pen Needle (PEN NEEDLES) 31G X 5 MM MISC Use to inject insulin  once daily   Iron , Ferrous Sulfate , 325 (65 Fe) MG TABS Take 325 mg by mouth daily.   losartan  (COZAAR ) 50 MG tablet Take 1.5 tablets (75 mg total) by mouth daily.   metoprolol   succinate (TOPROL -XL) 50 MG 24 hr tablet TAKE 1 TABLET BY MOUTH ONCE DAILY *PATIENT NEEDS APPOINTMENT*   mirtazapine  (REMERON ) 15 MG tablet TAKE 1 TABLET BY MOUTH EACH EVENING   rosuvastatin  (CRESTOR ) 10 MG tablet TAKE 1 TABLET BY MOUTH EVERY MORNING   Sevelamer  Carbonate (RENVELA  PO) Take 1 packet by mouth See admin instructions. 1 packet with each meal and with snacks   sucroferric oxyhydroxide (VELPHORO) 500 MG chewable tablet Chew 500 mg by mouth 3 (three) times daily with meals. Patient sprinkles on her food   No facility-administered encounter medications on file as of 10/06/2023.    Review of Systems:  Review of Systems  Health Maintenance  Topic Date Due   Fecal DNA (Cologuard)  Never done   OPHTHALMOLOGY EXAM  09/17/2021   COVID-19 Vaccine (2 - 2024-25 season) 11/22/2022   Zoster Vaccines- Shingrix (1 of 2) 11/24/2023 (Originally 03/21/2013)   INFLUENZA VACCINE  10/22/2023   FOOT EXAM  12/31/2023   HEMOGLOBIN A1C  01/27/2024   MAMMOGRAM  07/13/2024   Cervical Cancer Screening (HPV/Pap Cotest)  10/02/2024   Pneumococcal Vaccine 67-74 Years old (4 of 4 - PCV20 or PCV21) 01/10/2027   Hepatitis B Vaccines  Completed   Hepatitis C Screening  Completed   HIV Screening  Completed   HPV VACCINES  Aged Out   Meningococcal B Vaccine  Aged Out   DTaP/Tdap/Td  Discontinued    Physical Exam: There were no vitals filed for this visit. There is no height or weight on file to calculate BMI. Physical Exam  Labs reviewed: Basic Metabolic Panel: Recent Labs    02/11/23 0805 03/01/23 1639 03/03/23 0541 03/03/23 0544 03/10/23 2202 03/25/23 1144 09/02/23 1000  NA 138   < >  --  136 133* 139  --   K 4.1   < >  --  4.7 4.0 5.2  --   CL 96*   < >  --  102 93* 103  --   CO2 28   < >  --  22 24 20   --   GLUCOSE 115*   < >  --  84 261* 303*  --   BUN 16   < >  --  35* 21* 55*  --   CREATININE 5.67*   < >  --  7.91* 4.82* 8.86*  --   CALCIUM  8.9   < >  --  8.9 9.3 8.9  --   PHOS   --   --  5.8* 5.8*  --   --   --   TSH 2.69  --   --   --   --   --  1.62   < > = values in  this interval not displayed.   Liver Function Tests: Recent Labs    01/07/23 1439 02/11/23 0805 03/01/23 1639 03/03/23 0544 09/02/23 1000  AST 9 15 18   --  12  ALT 10 15 20   --  18  ALKPHOS 88  --  84  --   --   BILITOT 0.4 0.5 0.4  --  0.6  PROT 7.4 8.0 7.4  --  7.8  ALBUMIN 4.1  --  3.4* 3.2*  --    No results for input(s): LIPASE, AMYLASE in the last 8760 hours. No results for input(s): AMMONIA in the last 8760 hours. CBC: Recent Labs    03/10/23 2202 03/25/23 1144 09/02/23 1000  WBC 12.1* 8.6 6.4  NEUTROABS 8.9* 4,971 3,674  HGB 12.5 11.2* 12.0  HCT 37.8 34.2* 37.7  MCV 92.4 95.8 97.7  PLT 219 263 210   Lipid Panel: Recent Labs    02/11/23 0805 03/03/23 0544 09/02/23 1000  CHOL 124 109 121  HDL 33* 22* 35*  LDLCALC 63 54 57  TRIG 223* 163* 220*  CHOLHDL 3.8 5.0 3.5   Lab Results  Component Value Date   HGBA1C 8.7 (A) 07/27/2023    Procedures since last visit: No results found.  Assessment/Plan There are no diagnoses linked to this encounter.   Labs/tests ordered:  * No order type specified * Next appt:  02/22/2024      This encounter was created in error - please disregard.

## 2023-10-06 ENCOUNTER — Other Ambulatory Visit: Payer: Self-pay | Admitting: Cardiology

## 2023-10-06 ENCOUNTER — Encounter: Admitting: Adult Health

## 2023-10-06 DIAGNOSIS — N186 End stage renal disease: Secondary | ICD-10-CM | POA: Diagnosis not present

## 2023-10-07 DIAGNOSIS — N186 End stage renal disease: Secondary | ICD-10-CM | POA: Diagnosis not present

## 2023-10-08 DIAGNOSIS — N186 End stage renal disease: Secondary | ICD-10-CM | POA: Diagnosis not present

## 2023-10-09 DIAGNOSIS — N186 End stage renal disease: Secondary | ICD-10-CM | POA: Diagnosis not present

## 2023-10-10 DIAGNOSIS — N186 End stage renal disease: Secondary | ICD-10-CM | POA: Diagnosis not present

## 2023-10-11 DIAGNOSIS — N186 End stage renal disease: Secondary | ICD-10-CM | POA: Diagnosis not present

## 2023-10-12 DIAGNOSIS — N186 End stage renal disease: Secondary | ICD-10-CM | POA: Diagnosis not present

## 2023-10-13 DIAGNOSIS — N186 End stage renal disease: Secondary | ICD-10-CM | POA: Diagnosis not present

## 2023-10-14 ENCOUNTER — Ambulatory Visit (INDEPENDENT_AMBULATORY_CARE_PROVIDER_SITE_OTHER): Admitting: Podiatry

## 2023-10-14 ENCOUNTER — Other Ambulatory Visit: Payer: Self-pay

## 2023-10-14 ENCOUNTER — Encounter: Payer: Self-pay | Admitting: Podiatry

## 2023-10-14 DIAGNOSIS — E1165 Type 2 diabetes mellitus with hyperglycemia: Secondary | ICD-10-CM

## 2023-10-14 DIAGNOSIS — L03031 Cellulitis of right toe: Secondary | ICD-10-CM | POA: Diagnosis not present

## 2023-10-14 DIAGNOSIS — L600 Ingrowing nail: Secondary | ICD-10-CM | POA: Diagnosis not present

## 2023-10-14 DIAGNOSIS — N186 End stage renal disease: Secondary | ICD-10-CM | POA: Diagnosis not present

## 2023-10-14 DIAGNOSIS — L02611 Cutaneous abscess of right foot: Secondary | ICD-10-CM | POA: Diagnosis not present

## 2023-10-14 MED ORDER — DOXYCYCLINE HYCLATE 100 MG PO TABS: 100.0000 mg | ORAL_TABLET | Freq: Two times a day (BID) | ORAL | 0 refills | Status: AC

## 2023-10-14 NOTE — Progress Notes (Signed)
 Patient presents with complaint of a painful nail on the hallux right.  Has been very sore.  She has not been soaking it or do anything with it.  She has been having drainage clear to purulent drainage.  Exquisitely tender.  No F/C or N/V.  Patient is a diabetic.  Says her blood sugars have been running in the 300s.  She is on dialysis   Physical exam:  General appearance: Pleasant, and in no acute distress. AOx3.  Vascular: Pedal pulses: DP 2/4 bilaterally, PT 1/4 bilaterally.  Mild edema lower legs bilaterally. Capillary fill time immediate bilaterally.  Neurological: Light touch intact feet bilaterally.  Normal Achilles reflex bilaterally.  No clonus or spasticity noted.   Dermatologic:   Ingrown hallux nail right with incurvated borders and purulent drainage along the lateral border.  Redness along the nail fold with swelling.  Skin somewhat thin and atrophic with no hair growth lower extremity. \ Musculoskeletal: Tenderness distal hallux right    Diagnosis: 1.  Ingrown nail hallux right 2.  Cellulitis hallux right   Plan: -Established office visit for evaluation and management level 3. -Discussed the ingrown nail.  Given her poorly controlled diabetes would recommend avulsion of the nail after being on antibiotics for a week.  Will forego a matrixectomy due to poor diabetic control. -Will have her soak the foot twice daily with lukewarm Epsom salt water  for 15 minute, apply antibiotic ointment, and a gauze dressing.   -Rx doxycycline  100 mg, 1 p.o. twice daily for 10 days  Return 1 for avulsion hallux nail right

## 2023-10-14 NOTE — Patient Outreach (Signed)
 Complex Care Management   Visit Note  10/14/2023  Name:  Latoya Walsh MRN: [id number] DOB: [birth year]  Situation: Referral received for Complex Care Management related to Stroke and Diabetes with Complications, pain and discoloration to right great toe. I obtained verbal consent from Patient. I obtained verbal consent from Patient.  Visit completed with patient on the phone.  Background:   Past Medical History:  Diagnosis Date   Allergic rhinitis    Anxiety    CHF (congestive heart failure) (HCC)    a. EF 30-35% by echo in 05/2020 b. EF at 45% in 09/2020   Chronic back pain    Chronic hepatitis C without hepatic coma (HCC)    COVID-19 virus infection 12/26/2019   Depression    Diabetes mellitus    Hepatitis C    Hypertension    Noncompliance    Poor appetite 07/17/2014   Substance abuse (HCC)    HX of drug use and alcohol use    Assessment: Patient Reported Symptoms:  Cognitive Cognitive Status: Alert and oriented to person, place, and time Cognitive/Intellectual Conditions Management [RPT]: None reported or documented in medical history or problem list   Health Maintenance Behaviors: Annual physical exam  Neurological Neurological Review of Symptoms: Not assessed    HEENT HEENT Symptoms Reported: Not assessed      Cardiovascular Cardiovascular Symptoms Reported: Not assessed    Respiratory Respiratory Symptoms Reported: Not assesed    Endocrine Endocrine Symptoms Reported: Hyperglycemia Is patient diabetic?: Yes Is patient checking blood sugars at home?: Yes List most recent blood sugar readings, include date and time of day: FBS today 301 Endocrine Self-Management Outcome: 2 (bad) Endocrine Comment: sent message to PCP re: ongoing hyperglycemia  Gastrointestinal Gastrointestinal Symptoms Reported: Not assessed      Genitourinary Genitourinary Symptoms Reported: No symptoms reported Genitourinary Management Strategies: Hemodialysis Hemodialysis Schedule:  M, W, Friday Hemodialysis Last Treatment: 10/13/23 Genitourinary Self-Management Outcome: 4 (good)  Integumentary Integumentary Symptoms Reported: Wound Additional Integumentary Details: right great toe is painful and has discoloration, patient states, it turned black Skin Management Strategies: Routine screening, Adequate rest Skin Self-Management Outcome: 1 (very bad) Skin Comment: f/u appt scheduled w/Podiatry today  Musculoskeletal Musculoskelatal Symptoms Reviewed: Unsteady gait, Difficulty walking Additional Musculoskeletal Details: due to having pain to right great toe Musculoskeletal Management Strategies: Adequate rest, Routine screening Falls in the past year?: No Number of falls in past year: 1 or less Was there an injury with Fall?: No Fall Risk Category Calculator: 0 Patient Fall Risk Level: Low Fall Risk Patient at Risk for Falls Due to: Impaired balance/gait Fall risk Follow up: Falls evaluation completed, Education provided  Psychosocial Psychosocial Symptoms Reported: Not assessed            08/24/2023    9:08 AM  Depression screen PHQ 2/9  Decreased Interest 1  Down, Depressed, Hopeless 1  PHQ - 2 Score 2  Altered sleeping 3  Tired, decreased energy 2  Change in appetite 1  Feeling bad or failure about yourself  0  Trouble concentrating 0  Moving slowly or fidgety/restless 2  Suicidal thoughts 0  PHQ-9 Score 10    There were no vitals filed for this visit.  Medications Reviewed Today     Reviewed by Morgan Clayborne CROME, RN (Registered Nurse) on 10/14/23 at 1120  Med List Status: <None>   Medication Order Taking? Sig Documenting Provider Last Dose Status Informant  albuterol  (VENTOLIN  HFA) 108 (90 Base) MCG/ACT inhaler [id number] No Inhale 2 puffs  into the lungs every 4 (four) hours as needed for wheezing or shortness of breath. [name], [name], NP Taking Active   amLODipine  (NORVASC ) 10 MG tablet [id number] No Take 10 mg by mouth every morning.  Patient  not taking: Reported on 08/31/2023   [provider] Not Taking Active   aspirin  EC 81 MG tablet [id number] No Take 1 tablet (81 mg total) by mouth daily. Swallow whole. Sherryl Bouchard, NP Taking Active   Blood Glucose Monitoring Suppl (ONETOUCH VERIO FLEX SYSTEM) w/Device KIT [id number] No USE TO TEST BLOOD SUGAR TWICE DAILY AS DIRECTED *REFILL REQUEST* [name], [name], NP Taking Active Self, Pharmacy Records, Family Member  busPIRone  (BUSPAR ) 5 MG tablet [id number]  TAKE ONE TABLET BY MOUTH TWICE DAILY [name], [name], NP  Active   COMFORT EZ PEN NEEDLES 32G X 4 MM MISC [id number] No USE AS DIRECTED with insulin  injections Therisa Benton PARAS, NP Taking Active Self, Pharmacy Records, Family Member  Continuous Glucose Receiver (FREESTYLE LIBRE 3 READER) DEVI [id number] No 1 Device by Does not apply route continuous. [name], [name], NP Taking Active   Continuous Glucose Sensor (FREESTYLE LIBRE 3 PLUS SENSOR) MISC [id number] No Change sensor every 15 days. [name], [name], NP Taking Active   Continuous Glucose Sensor (FREESTYLE LIBRE 3 SENSOR) [place] [id number] No 1 Device by Does not apply route continuous. Place 1 sensor on the skin every 14 days. Use to check glucose continuously Dartha Ernst, MD Taking Active Self, Pharmacy Records, Family Member           Med Note (ROBB, MELANIE A   Thu Jan 28, 2023  9:09 AM) Has not picked up from the pharmacy  D3-1000 25 MCG (1000 UT) capsule [id number] No Take 1,000 Units by mouth daily. [provider] Taking Active Self, Pharmacy Records, Family Member  docusate sodium  (COLACE) 100 MG capsule [id number] No Take 1 capsule (100 mg total) by mouth daily as needed for mild constipation. [name], Roxan BROCKS, NP Taking Active Self, Pharmacy Records, Family Member  insulin  glargine (LANTUS  SOLOSTAR) 100 UNIT/ML Solostar Pen [id number] No Inject 48 Units into the skin at bedtime.  Patient taking differently: Inject 38 Units into the skin at bedtime. Patient  taking differently: 28 Units Subcutaneous Daily at bedtime, Patient is taking 28 units due to taking 38 units causes hypoglycemia, Indications: Type 2 Diabetes Mellitus,    Motwani, Komal, MD Taking Active Self  insulin  lispro (HUMALOG  KWIKPEN) 100 UNIT/ML KwikPen [id number] No Inject 6 Units into the skin 2 (two) times daily. [name], [name], NP Taking Active   Insulin  Pen Needle (PEN NEEDLES) 31G X 5 MM MISC [id number] No Use to inject insulin  once daily Therisa, Benton PARAS, NP Taking Active Self, Pharmacy Records, Family Member  Iron , Ferrous Sulfate , 325 (65 Fe) MG TABS [id number] No Take 325 mg by mouth daily. [name], Roxan BROCKS, NP Taking Active Self, Pharmacy Records, Family Member  losartan  (COZAAR ) 50 MG tablet [id number] No Take 1.5 tablets (75 mg total) by mouth daily. [name], [name], NP Taking Active   metoprolol  succinate (TOPROL -XL) 50 MG 24 hr tablet [id number]  TAKE 1 TABLET BY MOUTH ONCE DAILY *PATIENT NEEDS APPOINTMENT* Branch, Dorn FALCON, MD  Active   mirtazapine  (REMERON ) 15 MG tablet [id number] No TAKE 1 TABLET BY MOUTH EACH EVENING [name], [name], NP Taking Active   rosuvastatin  (CRESTOR ) 10 MG tablet [id number] No TAKE 1 TABLET BY MOUTH EVERY MORNING [name], [name], NP Taking Active   Sevelamer  Carbonate (RENVELA   PO) [id number] No Take 1 packet by mouth See admin instructions. 1 packet with each meal and with snacks [provider] Taking Active Self, Pharmacy Records, Family Member           Med Note (CRUTHIS, CHLOE C   Tue Mar 02, 2023 10:52 AM) Pt is unsure of the dose of her packets. No fill hx found.   sucroferric oxyhydroxide (VELPHORO) 500 MG chewable tablet [id number] No Chew 500 mg by mouth 3 (three) times daily with meals. Patient sprinkles on her food [provider] Taking Active Self, Pharmacy Records, Family Member  Med List Note Gaylon Charmaine NOVAK, [place] 97/71/82 [id number]):              Recommendation:   Specialty provider follow-up with Dr. Christine  with [facility] today at 3:15 PM  Follow Up Plan:   Telephone follow up appointment date/time:  Tuesday, August 29 at 10:00 AM   Clayborne Ly RN BSN CCM [place]  [facility], [place] [facility] Health Nurse Care Coordinator  Direct Dial : [phone] Website: [email or web address]

## 2023-10-14 NOTE — Patient Instructions (Signed)
 Visit Information  Thank you for taking time to visit with me today. Please don't hesitate to contact me if I can be of assistance to you before our next scheduled appointment.  Our next appointment is by telephone on Tuesday, August 29 at 10:00 AM Please call the care guide team at [phone] if you need to cancel or reschedule your appointment.   Following is a copy of your care plan:   Goals Addressed             This Visit's Progress    VBCI RN Care Plan       Problems:  Non-adherence to scheduled provider appointments  Goal: Over the next 30 days the Patient will attend all scheduled medical appointments: with Endocrinology as evidenced by attending appt on 07/27/23 at 2:20pm.          Interventions:   Evaluation of current treatment plan related to DMII, Memory Deficits self-management and patient's adherence to plan as established by provider Review of patient status, including review of consultant's reports, relevant laboratory and other test results, and medication reconciliation completed Reviewed and discussed with patient and brother Rwanda, patient's upcoming scheduled appointment with Dr. Dartha, Endocrinologist scheduled for 07/27/23 @2 :20 PM for evaluation and treatment of type 2 diabetes Counseled on the importance of keeping all scheduled appointments Provided patient and brother Rwanda the contact number for [facility] transportation and discussed calling at least 3 days in advance to set up transportation for appointments, brother Rwanda verbalizes understanding and recorded the phone number Discussed plans with patient for ongoing care management follow up and provided patient with direct contact information for care management team  Patient Self-Care Activities:  Attend all scheduled provider appointments  Plan:  Telephone follow up appointment with care management team member scheduled for:  Thursday,  May 22, at 11:00 AM.           COMPLETED: VBCI RN Care Plan        See new goal      VBCI RN Care Plan related to DMII       Problems:  Chronic Disease Management support and education needs related to DMII  Goal: Over the next 120 days the Patient will continue to work with Medical illustrator and/or Social Worker to address care management and care coordination needs related to DMII as evidenced by adherence to care management team scheduled appointments      Interventions:   Diabetes Interventions: Assessed patient's understanding of A1c goal: <7% Discussed with patient her FBS today was 301, she is currently 321 after lunch  Reviewed medications with patient and discussed importance of medication adherence Provided patient with verbal education materials related to hypo and hyperglycemia and importance of correct treatment Discussed patient is experiencing severe pain to her right great toe when lying down, she describes to toe as having had the skin peel back and the color is black Determined patient is scheduled to see Dr. Christine with [facility] next week on 10/19/23 Placed joint outbound call with patient and Dr. Diona office, determined patient can be seen today at 3:15 PM and patient is agreeable to being able to make this appointment time Educated patient re: potential for abnormally high blood sugars when the body is dealing with stress and or infection  Advised patient, providing education and rationale, to check cbg daily before meals and at bedtime and or when feeling like blood sugar is too high or too low record, calling PCP for findings  outside established parameters Sent in basket message to PCP, Roxan Plough NP to advise of patient's persistent hyperglycemia and toe pain/discoloration  Discussed plans with patient for ongoing nurse care management follow up and provided patient with direct contact information for nurse case management  Lab Results  Component Value Date   HGBA1C 8.7 (A) 07/27/2023    Patient Self-Care  Activities:  Attend all scheduled provider appointments Call pharmacy for medication refills 3-7 days in advance of running out of medications Call provider office for new concerns or questions  Take medications as prescribed   Work with the social worker to address care coordination needs and will continue to work with the clinical team to address health care and disease management related needs  Plan:  Follow up with provider re: evaluation of right great toe scheduled with Dr. Christine today at 3:15 PM Telephone follow up appointment with care management team member scheduled for:  Tuesday, August 29 at 10:00 AM             Please call 1-800-273-TALK (toll free, 24 hour hotline) if you are experiencing a Mental Health or Behavioral Health Crisis or need someone to talk to.  Patient verbalizes understanding of instructions and care plan provided today and agrees to view in MyChart. Active MyChart status and patient understanding of how to access instructions and care plan via MyChart confirmed with patient.     Clayborne Ly RN BSN CCM [place]  [facility], [facility] Health Nurse Care Coordinator  Direct Dial : [phone] Website: [email or web address]

## 2023-10-15 DIAGNOSIS — N186 End stage renal disease: Secondary | ICD-10-CM | POA: Diagnosis not present

## 2023-10-16 DIAGNOSIS — N186 End stage renal disease: Secondary | ICD-10-CM | POA: Diagnosis not present

## 2023-10-17 DIAGNOSIS — N186 End stage renal disease: Secondary | ICD-10-CM | POA: Diagnosis not present

## 2023-10-18 DIAGNOSIS — N186 End stage renal disease: Secondary | ICD-10-CM | POA: Diagnosis not present

## 2023-10-19 ENCOUNTER — Ambulatory Visit: Admitting: Podiatry

## 2023-10-19 ENCOUNTER — Other Ambulatory Visit: Payer: Self-pay

## 2023-10-19 DIAGNOSIS — N186 End stage renal disease: Secondary | ICD-10-CM | POA: Diagnosis not present

## 2023-10-19 DIAGNOSIS — E1169 Type 2 diabetes mellitus with other specified complication: Secondary | ICD-10-CM

## 2023-10-19 DIAGNOSIS — I639 Cerebral infarction, unspecified: Secondary | ICD-10-CM

## 2023-10-19 DIAGNOSIS — I509 Heart failure, unspecified: Secondary | ICD-10-CM

## 2023-10-20 DIAGNOSIS — N186 End stage renal disease: Secondary | ICD-10-CM | POA: Diagnosis not present

## 2023-10-21 ENCOUNTER — Encounter: Payer: Self-pay | Admitting: Podiatry

## 2023-10-21 ENCOUNTER — Ambulatory Visit (INDEPENDENT_AMBULATORY_CARE_PROVIDER_SITE_OTHER): Admitting: Podiatry

## 2023-10-21 DIAGNOSIS — L600 Ingrowing nail: Secondary | ICD-10-CM

## 2023-10-21 DIAGNOSIS — N186 End stage renal disease: Secondary | ICD-10-CM | POA: Diagnosis not present

## 2023-10-21 NOTE — Progress Notes (Signed)
 Patient complains of painful ingrown both border(s) toe 1 right. Patient denies fevers, chills, nausea, vomiting.  Finished antibiotics.  Has noticed less swelling and drainage.  Objective:  Vitals: Reviewed  General: Well developed, nourished, in no acute distress, alert and oriented x3   Vascular: DP pulse 2/4 bilateral. PT pulse 2/4 bilateral.  Capillary refill time immediate  Dermatology: Erythema, edema, incurvated nail borders hallux right with clear drainage . Tenderness present with palpation. Normal skin tone and texture feet with normal hair growth.  Decreased redness and show after antibiotics.  Neurological: Grossly intact. Normal reflexes.   Musculoskeletal: Tenderness with palpation of the distal distal hallux right. No tenderness or painful ROM at IPJ.  Diagnosis: Ingrown nail hallux nail right  Plan: -discussed etiology and treatment of ingrown nails. Discussed surgical vs conservative treatment. -Consent signed for appropriate avulsion affected nail(s).   Procedure(s):   - Avulsion hallux nail right: Toe anesthetized with 3cc 2:1 mixture 2% Lidocaine  with epinephrine : Sodium Bicarbonate . Surgical site prepped. Digital tourniquet applied.  Avulsion of nail plate. performed. Tourniquet released with good vascularity noticed in digit.  Applied triple antibiotic to nailbed and applied gauze and Coban dressing. - Written and oral postoperative instructions given.  -Return for post-op 2 weeks.  JINNY Prentice Binder, DPM

## 2023-10-21 NOTE — Patient Instructions (Signed)
 Visit Information  Thank you for taking time to visit with me today. Please don't hesitate to contact me if I can be of assistance to you before our next scheduled appointment.  Your next care management appointment is by telephone on Thursday, August 28 at 12:00 PM  Please call the care guide team at [phone] if you need to cancel, schedule, or reschedule an appointment.   Please call 1-800-273-TALK (toll free, 24 hour hotline) if you are experiencing a Mental Health or Behavioral Health Crisis or need someone to talk to.  Clayborne Ly RN BSN CCM [place]  [facility], [facility] Nurse Care Coordinator  Direct Dial : [phone] Website: [email or web address]

## 2023-10-21 NOTE — Patient Instructions (Signed)

## 2023-10-22 ENCOUNTER — Telehealth: Payer: Self-pay

## 2023-10-22 DIAGNOSIS — N186 End stage renal disease: Secondary | ICD-10-CM | POA: Diagnosis not present

## 2023-10-22 NOTE — Progress Notes (Signed)
 Complex Care Management Note Care Guide Note  10/22/2023 Name: Latoya Walsh MRN: [id number] DOB: 27-Jan-1961  [name] is a 63 y.o. year old female who is a primary care patient of [name], [name], NP . The community resource team was consulted for assistance with Transportation Needs   SDOH screenings and interventions completed:  No        Care guide performed the following interventions: Patient provided with information about care guide support team and interviewed to confirm resource needs.  Follow Up Plan:  Patient was unable to talk and asked that I call her back on Monday 8/4.  Encounter Outcome:  Patient Visit Completed  Ramonia Mcclaran [facility]  [facility] Resource Care Guide Direct Dial : [phone]  Fax: [phone] Website: [email or web address]

## 2023-10-23 DIAGNOSIS — N186 End stage renal disease: Secondary | ICD-10-CM | POA: Diagnosis not present

## 2023-10-24 DIAGNOSIS — N186 End stage renal disease: Secondary | ICD-10-CM | POA: Diagnosis not present

## 2023-10-25 ENCOUNTER — Telehealth: Payer: Self-pay

## 2023-10-25 DIAGNOSIS — N186 End stage renal disease: Secondary | ICD-10-CM | POA: Diagnosis not present

## 2023-10-25 NOTE — Progress Notes (Signed)
 Complex Care Management Note Care Guide Note  10/25/2023 Name: Latoya Walsh MRN: [id number] DOB: 1960-08-18  Latoya Walsh is a 63 y.o. year old female who is a primary care patient of [name], Latoya Walsh, Latoya Walsh . The community resource team was consulted for assistance with Transportation Needs  and Food Insecurity  SDOH screenings and interventions completed:  Yes  Social Drivers of Health From This Encounter   Food Insecurity: Food Insecurity Present (10/25/2023)   Hunger Vital Sign    Worried About Running Out of Food in the Last Year: Sometimes true    Ran Out of Food in the Last Year: Sometimes true  Housing: Low Risk  (10/25/2023)   Housing Stability Vital Sign    Unable to Pay for Housing in the Last Year: No    Number of Times Moved in the Last Year: 0    Homeless in the Last Year: No  Financial Resource Strain: Medium Risk (10/25/2023)   Overall Financial Resource Strain (CARDIA)    Difficulty of Paying Living Expenses: Somewhat hard  Transportation Needs: No Transportation Needs (10/25/2023)   PRAPARE - Administrator, Civil Service (Medical): No    Lack of Transportation (Non-Medical): No  Utilities: Not At Risk (10/25/2023)   Utilities    Threatened with loss of utilities: No    SDOH Interventions Today    Flowsheet Row Most Recent Value  SDOH Interventions   Food Insecurity Interventions Other (Comment)  [facility] food pantry list per patient request.]     Care guide performed the following interventions: Patient provided with information about care guide support team and interviewed to confirm resource needs Mailing information for patient's transportation scheduled with [employer] transportation.  Follow Up Plan:  No further follow up planned at this time. The patient has been provided with needed resources.  Encounter Outcome:  Patient Visit Completed  Latoya Walsh [facility]  [facility] Resource Care  Guide Direct Dial : [phone]  Fax: [phone] Website: [email or web address]

## 2023-10-26 DIAGNOSIS — N186 End stage renal disease: Secondary | ICD-10-CM | POA: Diagnosis not present

## 2023-10-27 DIAGNOSIS — N186 End stage renal disease: Secondary | ICD-10-CM | POA: Diagnosis not present

## 2023-10-28 DIAGNOSIS — N186 End stage renal disease: Secondary | ICD-10-CM | POA: Diagnosis not present

## 2023-10-29 DIAGNOSIS — N186 End stage renal disease: Secondary | ICD-10-CM | POA: Diagnosis not present

## 2023-10-30 DIAGNOSIS — N186 End stage renal disease: Secondary | ICD-10-CM | POA: Diagnosis not present

## 2023-10-31 DIAGNOSIS — N186 End stage renal disease: Secondary | ICD-10-CM | POA: Diagnosis not present

## 2023-11-01 DIAGNOSIS — N186 End stage renal disease: Secondary | ICD-10-CM | POA: Diagnosis not present

## 2023-11-02 ENCOUNTER — Encounter: Payer: Self-pay | Admitting: Family

## 2023-11-02 ENCOUNTER — Ambulatory Visit: Admitting: Family

## 2023-11-02 VITALS — BP 138/80 | HR 83 | Temp 97.6°F | Resp 18 | Ht 67.0 in | Wt 170.2 lb

## 2023-11-02 DIAGNOSIS — F411 Generalized anxiety disorder: Secondary | ICD-10-CM | POA: Diagnosis not present

## 2023-11-02 DIAGNOSIS — E1165 Type 2 diabetes mellitus with hyperglycemia: Secondary | ICD-10-CM | POA: Diagnosis not present

## 2023-11-02 DIAGNOSIS — E1169 Type 2 diabetes mellitus with other specified complication: Secondary | ICD-10-CM

## 2023-11-02 DIAGNOSIS — N186 End stage renal disease: Secondary | ICD-10-CM | POA: Diagnosis not present

## 2023-11-02 DIAGNOSIS — Z01818 Encounter for other preprocedural examination: Secondary | ICD-10-CM | POA: Diagnosis not present

## 2023-11-02 DIAGNOSIS — E785 Hyperlipidemia, unspecified: Secondary | ICD-10-CM | POA: Diagnosis not present

## 2023-11-02 MED ORDER — LANTUS SOLOSTAR 100 UNIT/ML ~~LOC~~ SOPN
30.0000 [IU] | PEN_INJECTOR | Freq: Every day | SUBCUTANEOUS | 5 refills | Status: AC
Start: 2023-11-02 — End: ?

## 2023-11-03 DIAGNOSIS — N186 End stage renal disease: Secondary | ICD-10-CM | POA: Diagnosis not present

## 2023-11-04 ENCOUNTER — Encounter: Payer: Self-pay | Admitting: Podiatry

## 2023-11-04 ENCOUNTER — Ambulatory Visit (INDEPENDENT_AMBULATORY_CARE_PROVIDER_SITE_OTHER): Admitting: Podiatry

## 2023-11-04 DIAGNOSIS — L02611 Cutaneous abscess of right foot: Secondary | ICD-10-CM

## 2023-11-04 DIAGNOSIS — L03031 Cellulitis of right toe: Secondary | ICD-10-CM

## 2023-11-04 DIAGNOSIS — N186 End stage renal disease: Secondary | ICD-10-CM | POA: Diagnosis not present

## 2023-11-04 MED ORDER — DOXYCYCLINE HYCLATE 100 MG PO TABS: 100.0000 mg | ORAL_TABLET | Freq: Two times a day (BID) | ORAL | 0 refills | Status: AC

## 2023-11-04 MED ORDER — MUPIROCIN 2 % EX OINT: 1.0000 | TOPICAL_OINTMENT | Freq: Two times a day (BID) | CUTANEOUS | 0 refills | Status: AC

## 2023-11-04 NOTE — Progress Notes (Signed)
 Patient presents for follow-up nail avulsion on the right.  Patient complains with pain.  Upon presentation Cefadyl has original dressing that was applied 2 weeks ago.  She has not done any of the wound care such as soaks and antibiotic ointment or dressings get the procedure done.  No fever or chills or nausea or vomiting.     Physical exam:  General appearance: Pleasant, and in no acute distress. AOx3.  Vascular: Pedal pulses: DP 2/4 bilaterally, PT 2/4 bilaterally.  Moderate edema l hallux right. Capillary fill time immediate right.  Neurological: Grossly intact bilaterally  Dermatologic:   With removal of the original dressing which she still had on several months of purulence at the nail bed although no strong odors.  Nailbed and proximal nail fold was red and swollen.  Appears well-vascularized.  Musculoskeletal: Tenderness hallux right.    Diagnosis: 1.  Cellulitis hallux right.  Plan: -Established office visit for evaluation and management level 3. - Discussed with her importance of following instructions.  She kept the original dressing on and has not done any soaks and has not had the original dressing  off it from 2 weeks ago. -Gave written and oral wound care instructions soaking twice daily for 15 minutes in warm Epsom salt water, apply Bactroban ointment and a light dressing. -Rx doxycycline 100 mg, 1 p.o. twice daily for 14 days. -Rx Bactroban ointment, applied to wound twice daily, 1 right  Return 1 week for follow-up infected hallux right

## 2023-11-04 NOTE — Patient Instructions (Signed)

## 2023-11-05 DIAGNOSIS — N186 End stage renal disease: Secondary | ICD-10-CM | POA: Diagnosis not present

## 2023-11-06 DIAGNOSIS — N186 End stage renal disease: Secondary | ICD-10-CM | POA: Diagnosis not present

## 2023-11-07 DIAGNOSIS — N186 End stage renal disease: Secondary | ICD-10-CM | POA: Diagnosis not present

## 2023-11-08 DIAGNOSIS — N186 End stage renal disease: Secondary | ICD-10-CM | POA: Diagnosis not present

## 2023-11-08 LAB — WOUND CULTURE
MICRO NUMBER:: 16832157
RESULT:: NO GROWTH
SPECIMEN QUALITY:: ADEQUATE

## 2023-11-09 ENCOUNTER — Other Ambulatory Visit

## 2023-11-09 DIAGNOSIS — E1165 Type 2 diabetes mellitus with hyperglycemia: Secondary | ICD-10-CM | POA: Diagnosis not present

## 2023-11-09 DIAGNOSIS — N186 End stage renal disease: Secondary | ICD-10-CM | POA: Diagnosis not present

## 2023-11-10 DIAGNOSIS — N186 End stage renal disease: Secondary | ICD-10-CM | POA: Diagnosis not present

## 2023-11-10 LAB — HEMOGLOBIN A1C
Hgb A1c MFr Bld: 9.7 % — ABNORMAL HIGH (ref <5.7)
Mean Plasma Glucose: 232 mg/dL
eAG (mmol/L): 12.8 mmol/L

## 2023-11-11 ENCOUNTER — Ambulatory Visit: Payer: Self-pay | Admitting: Family

## 2023-11-11 DIAGNOSIS — E1165 Type 2 diabetes mellitus with hyperglycemia: Secondary | ICD-10-CM

## 2023-11-11 DIAGNOSIS — N186 End stage renal disease: Secondary | ICD-10-CM | POA: Diagnosis not present

## 2023-11-12 DIAGNOSIS — N186 End stage renal disease: Secondary | ICD-10-CM | POA: Diagnosis not present

## 2023-11-13 DIAGNOSIS — N186 End stage renal disease: Secondary | ICD-10-CM | POA: Diagnosis not present

## 2023-11-14 DIAGNOSIS — N186 End stage renal disease: Secondary | ICD-10-CM | POA: Diagnosis not present

## 2023-11-14 NOTE — Progress Notes (Signed)
 Provider: Roxan Plough FNP-C  Dealie Koelzer, Roxan BROCKS, NP  Patient Care Team: Isaish Alemu, Roxan BROCKS, NP as PCP - General (Family Medicine) Alvan Dorn FALCON, MD as PCP - Cardiology (Cardiology) [facility], Georgia Little, Clayborne CROME, RN as [facility] Care Management  Extended Emergency Contact Information Primary Emergency Contact: Flippen,Shavon Mobile Phone: [phone] Relation: Daughter Secondary Emergency Contact: Fountain,Chandy Mobile Phone: [phone] Relation: Sister  Code Status: Full Code  Goals of care: Advanced Directive information    11/02/2023    3:57 PM  Advanced Directives  Does Patient Have a Medical Advance Directive? No  Would patient like information on creating a medical advance directive? No - Patient declined     Chief Complaint  Patient presents with   Medical Clearance    Dental treatment/ high blood sugars    Discussed the use of AI scribe software for clinical note transcription with the patient, who gave verbal consent to proceed.  History of Present Illness   Latoya Walsh is a 63 year old female who presents for dental clearance prior to tooth extraction.  She is scheduled for a dental procedure to remove her teeth on August 26th and requires medical clearance. She is currently taking aspirin  81 mg.  Her blood sugar levels have been consistently high, ranging from 200 to 300 mg/dL, despite using insulin . She is currently using Humalog , six units twice daily after meals, and Lantus , 28 units at bedtime. She sometimes forgets to take her Lantus , which affects her blood sugar control. She wants her blood sugar levels in the 120s.  She has a history of hypertension and is on multiple medications, including metoprolol , losartan , and amlodipine , but is unsure about the specific medications she is currently taking. She also takes rosuvastatin  for cholesterol and buspirone  5 mg twice daily for anxiety.  She recently had a toenail removed on right big  toe, which has been causing her pain. She takes Tylenol  for the pain but has not been prescribed any specific pain medication. The procedure was done last week, and she has a follow-up appointment scheduled for Thursday.  She has a history of an old anterior septal infarct, as noted on a recent EKG, but no new cardiac issues have been reported. No history of anesthesia reactions and is only allergic to penicillin.  Past Medical History:  Diagnosis Date   Allergic rhinitis    Anxiety    CHF (congestive heart failure) (HCC)    a. EF 30-35% by echo in 05/2020 b. EF at 45% in 09/2020   Chronic back pain    Chronic hepatitis C without hepatic coma (HCC)    COVID-19 virus infection 12/26/2019   Depression    Diabetes mellitus    Hepatitis C    Hypertension    Noncompliance    Poor appetite 07/17/2014   Substance abuse (HCC)    HX of drug use and alcohol use   Past Surgical History:  Procedure Laterality Date   APPENDECTOMY  2004   AV FISTULA PLACEMENT Left 11/26/2020   Procedure: LEFT ARM ARTERIOVENOUS (AV) FISTULA CREATION;  Surgeon: Oris Krystal FALCON, MD;  Location: [facility];  Service: Vascular;  Laterality: Left;   AV FISTULA PLACEMENT Left 06/03/2021   Procedure: INSERTION OF LEFT UPPER ARM ARTERIOVENOUS (AV) GORE-TEX GRAFT;  Surgeon: Oris Krystal FALCON, MD;  Location: [facility];  Service: Vascular;  Laterality: Left;   EXCHANGE OF A DIALYSIS CATHETER Right 02/24/2022   Procedure: EXCHANGE OF A DIALYSIS CATHETER;  Surgeon: Oris Krystal  F, MD;  Location: [facility];  Service: Vascular;  Laterality: Right;   IR FLUORO GUIDE CV LINE RIGHT  12/25/2020   IR US  GUIDE VASC ACCESS RIGHT  12/25/2020   LIGATION ARTERIOVENOUS GORTEX GRAFT Left 08/05/2021   Procedure: LIGATION OF LEFT ARM ARTERIOVENOUS GORTEX GRAFT;  Surgeon: Oris Krystal FALCON, MD;  Location: [facility];  Service: Vascular;  Laterality: Left;    Allergies  Allergen Reactions   Penicillins Itching and Rash    Outpatient Encounter Medications as of  11/02/2023  Medication Sig   albuterol  (VENTOLIN  HFA) 108 (90 Base) MCG/ACT inhaler Inhale 2 puffs into the lungs every 4 (four) hours as needed for wheezing or shortness of breath.   amLODipine  (NORVASC ) 10 MG tablet Take 10 mg by mouth every morning.   aspirin  EC 81 MG tablet Take 1 tablet (81 mg total) by mouth daily. Swallow whole.   busPIRone  (BUSPAR ) 5 MG tablet TAKE ONE TABLET BY MOUTH TWICE DAILY   COMFORT EZ PEN NEEDLES 32G X 4 MM MISC USE AS DIRECTED with insulin  injections   Continuous Glucose Receiver (FREESTYLE LIBRE 3 READER) DEVI 1 Device by Does not apply route continuous.   Continuous Glucose Sensor (FREESTYLE LIBRE 3 PLUS SENSOR) MISC Change sensor every 15 days.   Continuous Glucose Sensor (FREESTYLE LIBRE 3 SENSOR) MISC 1 Device by Does not apply route continuous. Place 1 sensor on the skin every 14 days. Use to check glucose continuously   insulin  lispro (HUMALOG  KWIKPEN) 100 UNIT/ML KwikPen Inject 6 Units into the skin 2 (two) times daily.   Insulin  Pen Needle (PEN NEEDLES) 31G X 5 MM MISC Use to inject insulin  once daily   metoprolol  succinate (TOPROL -XL) 50 MG 24 hr tablet TAKE 1 TABLET BY MOUTH ONCE DAILY *PATIENT NEEDS APPOINTMENT*   mirtazapine  (REMERON ) 15 MG tablet TAKE 1 TABLET BY MOUTH EACH EVENING   rosuvastatin  (CRESTOR ) 10 MG tablet TAKE 1 TABLET BY MOUTH EVERY MORNING   Sevelamer  Carbonate (RENVELA  PO) Take 1 packet by mouth See admin instructions. 1 packet with each meal and with snacks   [DISCONTINUED] insulin  glargine (LANTUS  SOLOSTAR) 100 UNIT/ML Solostar Pen Inject 48 Units into the skin at bedtime. (Patient taking differently: Inject 28 Units into the skin at bedtime.)   insulin  glargine (LANTUS  SOLOSTAR) 100 UNIT/ML Solostar Pen Inject 30 Units into the skin at bedtime.   losartan  (COZAAR ) 50 MG tablet Take 1.5 tablets (75 mg total) by mouth daily.   [DISCONTINUED] Blood Glucose Monitoring Suppl (ONETOUCH VERIO FLEX SYSTEM) w/Device KIT USE TO TEST BLOOD  SUGAR TWICE DAILY AS DIRECTED *REFILL REQUEST* (Patient not taking: Reported on 11/02/2023)   [DISCONTINUED] D3-1000 25 MCG (1000 UT) capsule Take 1,000 Units by mouth daily. (Patient not taking: Reported on 11/02/2023)   [DISCONTINUED] docusate sodium  (COLACE) 100 MG capsule Take 1 capsule (100 mg total) by mouth daily as needed for mild constipation. (Patient not taking: Reported on 11/02/2023)   [DISCONTINUED] Iron , Ferrous Sulfate , 325 (65 Fe) MG TABS Take 325 mg by mouth daily. (Patient not taking: Reported on 11/02/2023)   [DISCONTINUED] sucroferric oxyhydroxide (VELPHORO) 500 MG chewable tablet Chew 500 mg by mouth 3 (three) times daily with meals. Patient sprinkles on her food (Patient not taking: Reported on 11/02/2023)   No facility-administered encounter medications on file as of 11/02/2023.    Review of Systems  Immunization History  Administered Date(s) Administered   H1N1 04/04/2008   Hepb-cpg 02/07/2021, 03/14/2021, 04/09/2021, 06/04/2021   Influenza Split 02/05/2011, 12/02/2011   Influenza  Whole 12/10/2006, 01/08/2009   Influenza, Mdck, Trivalent,PF 6+ MOS(egg free) 01/06/2023   Influenza, Quadrivalent, Recombinant, Inj, Pf 12/26/2021   Influenza,inj,Quad PF,6+ Mos 02/12/2014, 03/27/2015, 03/05/2020, 01/22/2021   PFIZER(Purple Top)SARS-COV-2 Vaccination 09/29/2019   Pneumococcal Conjugate-13 06/11/2014   Pneumococcal Polysaccharide-23 12/05/2004, 10/30/2010, 01/09/2022   Td 08/09/2003   Pertinent  Health Maintenance Due  Topic Date Due   OPHTHALMOLOGY EXAM  09/17/2021   INFLUENZA VACCINE  10/22/2023   FOOT EXAM  12/31/2023   HEMOGLOBIN A1C  05/11/2024   MAMMOGRAM  07/13/2024      02/23/2023    9:25 AM 07/15/2023    2:09 PM 08/24/2023    9:08 AM 10/14/2023   11:27 AM 11/02/2023    3:57 PM  Fall Risk  Falls in the past year? 0 0 0 0 0  Was there an injury with Fall? 0 0 0 0 0  Fall Risk Category Calculator 0 0 0 0 0  Patient at Risk for Falls Due to No Fall Risks No Fall  Risks No Fall Risks Impaired balance/gait No Fall Risks  Fall risk Follow up Falls evaluation completed  Falls evaluation completed Falls evaluation completed;Education provided Falls evaluation completed   Functional Status Survey:    Vitals:   11/02/23 1604  BP: 138/80  Pulse: 83  Resp: 18  Temp: 97.6 F (36.4 C)  SpO2: 99%  Weight: 170 lb 3.2 oz (77.2 kg)  Height: 5' 7 (1.702 m)   Body mass index is 26.66 kg/m. Physical Exam  GENERAL: Alert, cooperative, well developed, no acute distress. HEENT: Normocephalic, normal oropharynx, moist mucous membranes. CHEST: Clear to auscultation bilaterally, no wheezes, rhonchi, or crackles. CARDIOVASCULAR: Normal heart rate and rhythm, S1 and S2 normal without murmurs. ABDOMEN: Soft, non-tender, non-distended, without organomegaly, normal bowel sounds. EXTREMITIES: No cyanosis or edema, toe movement intact. NEUROLOGICAL: Cranial nerves grossly intact, moves all extremities without gross motor or sensory deficit. SKIN: Skin around right great toe without Erythema   Labs reviewed: Recent Labs    03/03/23 0541 03/03/23 0544 03/10/23 2202 03/25/23 1144  NA  --  136 133* 139  K  --  4.7 4.0 5.2  CL  --  102 93* 103  CO2  --  22 24 20   GLUCOSE  --  84 261* 303*  BUN  --  35* 21* 55*  CREATININE  --  7.91* 4.82* 8.86*  CALCIUM   --  8.9 9.3 8.9  PHOS 5.8* 5.8*  --   --    Recent Labs    01/07/23 1439 02/11/23 0805 03/01/23 1639 03/03/23 0544 09/02/23 1000  AST 9 15 18   --  12  ALT 10 15 20   --  18  ALKPHOS 88  --  84  --   --   BILITOT 0.4 0.5 0.4  --  0.6  PROT 7.4 8.0 7.4  --  7.8  ALBUMIN 4.1  --  3.4* 3.2*  --    Recent Labs    03/10/23 2202 03/25/23 1144 09/02/23 1000  WBC 12.1* 8.6 6.4  NEUTROABS 8.9* 4,971 3,674  HGB 12.5 11.2* 12.0  HCT 37.8 34.2* 37.7  MCV 92.4 95.8 97.7  PLT 219 263 210   Lab Results  Component Value Date   TSH 1.62 09/02/2023   Lab Results  Component Value Date   HGBA1C 9.7 (H)  11/09/2023   Lab Results  Component Value Date   CHOL 121 09/02/2023   HDL 35 (L) 09/02/2023   LDLCALC 57 09/02/2023  TRIG 220 (H) 09/02/2023   CHOLHDL 3.5 09/02/2023    Significant Diagnostic Results in last 30 days:  No results found.  Assessment/Plan  Preoperative dental clearance Requires dental clearance for tooth extraction on November 16, 2023. EKG shows sinus rhythm with an old anterior septal infarct, no new changes. Clearance contingent on managing current medications and ensuring no contraindications for surgery. - Hold aspirin  and any aspirin -containing products for 7 days prior to surgery. - Continue blood pressure medications as prescribed. - Schedule lab work to check A1c before surgery.  Type 2 diabetes mellitus with suboptimal glycemic control Blood sugars consistently elevated in the 200-300 mg/dL range. Current insulin  regimen includes Lantus  28 units at bedtime and Humalog  6 units twice daily. Reports occasional forgetfulness in administering insulin , contributing to poor glycemic control. - Increase Lantus  to 30 units at bedtime. - Reinforce importance of consistent insulin  administration. - Order lab work to check A1c. - Schedule follow-up to review A1c results before dental surgery.  Hypertension On multiple antihypertensive medications including metoprolol , losartan , and amlodipine . Unclear if all medications are being taken as prescribed due to confusion with medication bottles. - Review and confirm current antihypertensive medications. - Ensure continuation of antihypertensive regimen.  Status post right great toe nail removal Recent toenail removal with ongoing pain. No signs of infection, but reports significant discomfort and difficulty sleeping due to pain. - Advise taking Tylenol  for pain management, one in the morning, at lunch, and in the evening. - Instruct not to remove surgical dressing until follow-up with the surgeon. - Follow-up appointment  with surgeon scheduled for Thursday.  Hyperlipidemia On rosuvastatin  for cholesterol management.  Anxiety disorder Taking buspirone  5 mg twice daily for anxiety management.   Family/ staff Communication: Reviewed plan of care with patient verbalized   Labs/tests ordered:  - EKG  - Hgb A1C   Next Appointment: Return in about 1 week (around 11/09/2023) for lab to check A1C .   Total time: minutes. Greater than 50% of total time spent doing patient education regarding T2DM,HTN, HLD,Generalized Anxiety disorder,pre-op  clearance,s/p great toe nail removal ,health maintenance including symptom/medication management.   Roxan JAYSON Plough, NP

## 2023-11-15 DIAGNOSIS — N186 End stage renal disease: Secondary | ICD-10-CM | POA: Diagnosis not present

## 2023-11-16 ENCOUNTER — Ambulatory Visit: Admitting: "Endocrinology

## 2023-11-16 DIAGNOSIS — N186 End stage renal disease: Secondary | ICD-10-CM | POA: Diagnosis not present

## 2023-11-17 DIAGNOSIS — N186 End stage renal disease: Secondary | ICD-10-CM | POA: Diagnosis not present

## 2023-11-18 ENCOUNTER — Other Ambulatory Visit: Payer: Self-pay

## 2023-11-18 DIAGNOSIS — Z794 Long term (current) use of insulin: Secondary | ICD-10-CM

## 2023-11-18 DIAGNOSIS — I509 Heart failure, unspecified: Secondary | ICD-10-CM

## 2023-11-18 DIAGNOSIS — N186 End stage renal disease: Secondary | ICD-10-CM | POA: Diagnosis not present

## 2023-11-18 DIAGNOSIS — Z992 Dependence on renal dialysis: Secondary | ICD-10-CM

## 2023-11-18 NOTE — Patient Outreach (Addendum)
 Complex Care Management   Visit Note  11/18/2023  Name:  Latoya Walsh MRN: [id number] DOB: 08-24-1960  Situation: Referral received for Complex Care Management related to Stroke and Diabetes with Complications, pain and discoloration to right great toe. I obtained verbal consent from Patient.  Visit completed with Patient on the phone.  Background:   Past Medical History:  Diagnosis Date   Allergic rhinitis    Anxiety    CHF (congestive heart failure) (HCC)    a. EF 30-35% by echo in 05/2020 b. EF at 45% in 09/2020   Chronic back pain    Chronic hepatitis C without hepatic coma (HCC)    COVID-19 virus infection 12/26/2019   Depression    Diabetes mellitus    Hepatitis C    Hypertension    Noncompliance    Poor appetite 07/17/2014   Substance abuse (HCC)    HX of drug use and alcohol use    Assessment: Patient Reported Symptoms:  Cognitive Cognitive Status: Alert and oriented to person, place, and time Cognitive/Intellectual Conditions Management [RPT]: None reported or documented in medical history or problem list   Health Maintenance Behaviors: Annual physical exam Health Facilitated by: Rest  Neurological Neurological Review of Symptoms: Not assessed    HEENT HEENT Symptoms Reported: Mouth or teeth pain HEENT Management Strategies: Routine screening HEENT Self-Management Outcome: 3 (uncertain) HEENT Comment: patient is scheduled to have her teeth pulled on 01/13/24    Cardiovascular Cardiovascular Symptoms Reported: Not assessed    Respiratory Respiratory Symptoms Reported: Not assesed    Endocrine Endocrine Symptoms Reported: Hypoglycemia, Sweating, Shakiness Is patient diabetic?: Yes Is patient checking blood sugars at home?: Yes List most recent blood sugar readings, include date and time of day: FBS today 183; FBS yesterday 135 Endocrine Self-Management Outcome: 4 (good) Endocrine Comment: Reviewed updated insulin  dosages with patient per PCP   Gastrointestinal Gastrointestinal Symptoms Reported: Not assessed      Genitourinary Genitourinary Symptoms Reported: No symptoms reported Genitourinary Management Strategies: Hemodialysis Hemodialysis Schedule: M, W, Friday Hemodialysis Last Treatment: 11/17/23 Genitourinary Self-Management Outcome: 4 (good)  Integumentary Integumentary Symptoms Reported: Wound Additional Integumentary Details: cellulitis and abscess for right great toe, patient is esbalished with Dr. Christine for foot care Skin Management Strategies: Routine screening, Dressing changes, Medication therapy Skin Self-Management Outcome: 3 (uncertain) Skin Comment: Educated patient re: recommendations to soak her toe in warm epsom salt twice daily, dry and apply Bactroban  as directed  Musculoskeletal Musculoskelatal Symptoms Reviewed: No symptoms reported        Psychosocial Psychosocial Symptoms Reported: No symptoms reported   Major Change/Loss/Stressor/Fears (CP): Denies Quality of Family Relationships: supportive Do you feel physically threatened by others?: No    11/18/2023    PHQ2-9 Depression Screening   Ne[place] Savage interest or pleasure in doing things    Feeling down, depressed, or hopeless    PHQ-2 - Total Score    Trouble falling or staying asleep, or sleeping too much    Feeling tired or having Toiya Morrish energy    Poor appetite or overeating     Feeling bad about yourself - or that you are a failure or have let yourself or your family down    Trouble concentrating on things, such as reading the newspaper or watching television    Moving or speaking so slowly that other people could have noticed.  Or the opposite - being so fidgety or restless that you have been moving around a lot more than usual    Thoughts that  you would be better off dead, or hurting yourself in some way    PHQ2-9 Total Score    If you checked off any problems, how difficult have these problems made it for you to do your work, take care of  things at home, or get along with other people    Depression Interventions/Treatment      There were no vitals filed for this visit.  Medications Reviewed Today     Reviewed by Morgan Clayborne CROME, RN (Registered Nurse) on 11/18/23 at 1225  Med List Status: <None>   Medication Order Taking? Sig Documenting Provider Last Dose Status Informant  albuterol  (VENTOLIN  HFA) 108 (90 Base) MCG/ACT inhaler [id number]  Inhale 2 puffs into the lungs every 4 (four) hours as needed for wheezing or shortness of breath. [name], [name], NP  Active   amLODipine  (NORVASC ) 10 MG tablet [id number]  Take 10 mg by mouth every morning. [provider]  Active   aspirin  EC 81 MG tablet [id number]  Take 1 tablet (81 mg total) by mouth daily. Swallow whole. Millikan, Megan, NP  Active   busPIRone  (BUSPAR ) 5 MG tablet [id number]  TAKE ONE TABLET BY MOUTH TWICE DAILY [name], [name], NP  Active   COMFORT EZ PEN NEEDLES 32G X 4 MM MISC [id number]  USE AS DIRECTED with insulin  injections Therisa Benton PARAS, NP  Active Self, Pharmacy Records, Family Member  Continuous Glucose Receiver (FREESTYLE LIBRE 3 READER) DEVI [id number]  1 Device by Does not apply route continuous. [name], [name], NP  Active   Continuous Glucose Sensor (FREESTYLE LIBRE 3 PLUS SENSOR) MISC [id number]  Change sensor every 15 days. [name], [name], NP  Active   Continuous Glucose Sensor (FREESTYLE LIBRE 3 SENSOR) [place] [id number]  1 Device by Does not apply route continuous. Place 1 sensor on the skin every 14 days. Use to check glucose continuously Dartha Ernst, MD  Active Self, Pharmacy Records, Family Member           Med Note (ROBB, MELANIE A   Thu Jan 28, 2023  9:09 AM) Has not picked up from the pharmacy  doxycycline  (VIBRA -TABS) 100 MG tablet [id number]  Take 1 tablet (100 mg total) by mouth 2 (two) times daily for 14 days. Christine Rush, DPM  Active   insulin  glargine (LANTUS  SOLOSTAR) 100 UNIT/ML Solostar Pen [id number]  Inject 15 Units into the  skin twice daily [name], [name], NP  Active   insulin  lispro (HUMALOG  KWIKPEN) 100 UNIT/ML KwikPen [id number]  Inject 8 Units into the skin 2 (two) times daily. [name], [name], NP  Active   Insulin  Pen Needle (PEN NEEDLES) 31G X 5 MM MISC [id number]  Use to inject insulin  once daily Therisa Benton PARAS, NP  Active Self, Pharmacy Records, Family Member  losartan  (COZAAR ) 50 MG tablet [id number]  Take 1.5 tablets (75 mg total) by mouth daily. [name], [name], NP  Active   metoprolol  succinate (TOPROL -XL) 50 MG 24 hr tablet [id number]  TAKE 1 TABLET BY MOUTH ONCE DAILY *PATIENT NEEDS APPOINTMENT* Branch, Dorn FALCON, MD  Active   mirtazapine  (REMERON ) 15 MG tablet [id number]  TAKE 1 TABLET BY MOUTH EACH EVENING [name], [name], NP  Active   mupirocin  ointment (BACTROBAN ) 2 % [id number]  Apply 1 Application topically 2 (two) times daily. Christine Rush, DPM  Active   rosuvastatin  (CRESTOR ) 10 MG tablet [id number]  TAKE 1 TABLET BY MOUTH EVERY MORNING [name], [name], NP  Active   Sevelamer  Carbonate (RENVELA   PO) [id number]  Take 1 packet by mouth See admin instructions. 1 packet with each meal and with snacks [provider]  Active Self, Pharmacy Records, Family Member           Med Note (CRUTHIS, CHLOE C   Tue Mar 02, 2023 10:52 AM) Pt is unsure of the dose of her packets. No fill hx found.   Med List Note Gaylon Charmaine NOVAK, [place] 97/71/82 8392):              Recommendation:   Specialty provider follow-up with Dr. Christine with [facility] on 11/23/23 at 10:15 AM  Follow Up Plan:   Telephone follow up appointment date/time:  Thursday, September 11 at 2:30 PM Referral to Care Guide  Clayborne Ly RN BSN CCM [facility]  [facility], [facility], [facility] Nurse Care Coordinator  Direct Dial : [phone] Website: [email or web address]

## 2023-11-18 NOTE — Patient Instructions (Signed)
 Visit Information  Thank you for taking time to visit with me today. Please don't hesitate to contact me if I can be of assistance to you before our next scheduled appointment.  Your next care management appointment is by telephone on Thursday, September 11 at 2:30 PM  Please call the care guide team at [phone] if you need to cancel, schedule, or reschedule an appointment.   Please call 1-800-273-TALK (toll free, 24 hour hotline) if you are experiencing a Mental Health or Behavioral Health Crisis or need someone to talk to.  Clayborne Ly RN BSN CCM [place]  [facility], [facility] Nurse Care Coordinator  Direct Dial : [phone] Website: [email or web address]

## 2023-11-19 ENCOUNTER — Telehealth: Payer: Self-pay | Admitting: *Deleted

## 2023-11-19 DIAGNOSIS — N186 End stage renal disease: Secondary | ICD-10-CM | POA: Diagnosis not present

## 2023-11-19 NOTE — Progress Notes (Signed)
 Complex Care Management Note Care Guide Note  11/19/2023 Name: Latoya Walsh MRN: [id number] DOB: Dec 21, 1960  Latoya Walsh is a 63 y.o. year old female who is a primary care patient of [name], [name], NP . The community resource team was consulted for assistance with Transportation Needs   SDOH screenings and interventions completed:  Yes  Social Drivers of Health From This Encounter   Transportation Needs: Unmet Transportation Needs (11/19/2023)   PRAPARE - Administrator, Civil Service (Medical): Yes    SDOH Interventions Today    Flowsheet Row Most Recent Value  SDOH Interventions   Transportation Interventions Payor Benefit  [Talked with patient has medicaid unable to schedule for 9/2 appt she has transportation available , she will need to schedule future transportation within 3 days notice]     Care guide performed the following interventions: Patient provided with information about care guide support team and interviewed to confirm resource needs.  Follow Up Plan:  No further follow up planned at this time. The patient has been provided with needed resources.  Encounter Outcome:  Patient Visit Completed  [place] Greenauer-Moran  [facility] Health[facility] Care Guide  Direct Dial :[phone] Fax:[phone] Website: [email or web address]

## 2023-11-20 DIAGNOSIS — N186 End stage renal disease: Secondary | ICD-10-CM | POA: Diagnosis not present

## 2023-11-21 DIAGNOSIS — N186 End stage renal disease: Secondary | ICD-10-CM | POA: Diagnosis not present

## 2023-11-21 DIAGNOSIS — E1122 Type 2 diabetes mellitus with diabetic chronic kidney disease: Secondary | ICD-10-CM | POA: Diagnosis not present

## 2023-11-21 DIAGNOSIS — Z992 Dependence on renal dialysis: Secondary | ICD-10-CM | POA: Diagnosis not present

## 2023-11-22 DIAGNOSIS — N186 End stage renal disease: Secondary | ICD-10-CM | POA: Diagnosis not present

## 2023-11-23 ENCOUNTER — Ambulatory Visit (INDEPENDENT_AMBULATORY_CARE_PROVIDER_SITE_OTHER): Admitting: Podiatry

## 2023-11-23 ENCOUNTER — Telehealth: Payer: Self-pay | Admitting: *Deleted

## 2023-11-23 ENCOUNTER — Encounter: Payer: Self-pay | Admitting: Podiatry

## 2023-11-23 VITALS — Ht 67.0 in | Wt 170.0 lb

## 2023-11-23 DIAGNOSIS — S91301D Unspecified open wound, right foot, subsequent encounter: Secondary | ICD-10-CM | POA: Diagnosis not present

## 2023-11-23 DIAGNOSIS — I739 Peripheral vascular disease, unspecified: Secondary | ICD-10-CM

## 2023-11-23 DIAGNOSIS — N186 End stage renal disease: Secondary | ICD-10-CM | POA: Diagnosis not present

## 2023-11-23 MED ORDER — MUPIROCIN 2 % EX OINT: 1.0000 | TOPICAL_OINTMENT | Freq: Two times a day (BID) | CUTANEOUS | 2 refills | Status: DC

## 2023-11-23 NOTE — Progress Notes (Signed)
 Patient presents for follow-up wound hallux right.  Still painful.  Has not noticed much drainage.  The bottom part of the toe is still dark.  Says she has been soaking soaking and applying antibiotic ointment to it.SABRA   Physical exam:  General appearance: Pleasant, and in no acute distress. AOx3.  Vascular: Pedal pulses: DP 1/4 bilaterally, PT 0/4 bilaterally. Mild edema lower legs bilaterally. Capillary fill time immediate bilaterally.  Neurological: Grossly intact bilaterally  Dermatologic:   Nail surgery site hallux right is healed.  On the plantar aspect and extending up to the medial and lateral aspect of the hallux there is a dark area of skin.  Tender to touch.  No signs of bacterial infection.  Mild clear drainage.  Musculoskeletal:      Diagnosis: 1.  Wound hallux right slow healing,  subsequent visit.  Plan: -Established office visit for evaluation and management level 3. - I discussed with the wound and the pain.  I think she has some ischemic changes in the toe.  Will order ABIs to check this.  Will have her continue doing wound care soaking twice daily in warm Epsom salt water .  Apply Bactroban  ointment and a light dressing.  Will also dispense a surgical shoe. -Dispensed surgical shoe right. -Rx Bactroban  ointment, apply twice daily to affected area, refill x 2  Return 2 weeks follow-up wound hallux right

## 2023-11-24 DIAGNOSIS — N186 End stage renal disease: Secondary | ICD-10-CM | POA: Diagnosis not present

## 2023-11-24 DIAGNOSIS — F411 Generalized anxiety disorder: Secondary | ICD-10-CM | POA: Diagnosis not present

## 2023-11-25 DIAGNOSIS — N186 End stage renal disease: Secondary | ICD-10-CM | POA: Diagnosis not present

## 2023-11-26 DIAGNOSIS — F411 Generalized anxiety disorder: Secondary | ICD-10-CM | POA: Diagnosis not present

## 2023-11-26 DIAGNOSIS — N186 End stage renal disease: Secondary | ICD-10-CM | POA: Diagnosis not present

## 2023-11-27 DIAGNOSIS — N186 End stage renal disease: Secondary | ICD-10-CM | POA: Diagnosis not present

## 2023-11-28 DIAGNOSIS — N186 End stage renal disease: Secondary | ICD-10-CM | POA: Diagnosis not present

## 2023-11-29 ENCOUNTER — Emergency Department (HOSPITAL_COMMUNITY)

## 2023-11-29 ENCOUNTER — Encounter (HOSPITAL_COMMUNITY): Payer: Self-pay

## 2023-11-29 ENCOUNTER — Other Ambulatory Visit: Payer: Self-pay

## 2023-11-29 ENCOUNTER — Emergency Department (HOSPITAL_COMMUNITY)
Admission: EM | Admit: 2023-11-29 | Discharge: 2023-11-29 | Disposition: A | Discharge status: Home / Self Care | Attending: Emergency Medicine | Admitting: Emergency Medicine

## 2023-11-29 DIAGNOSIS — D631 Anemia in chronic kidney disease: Secondary | ICD-10-CM | POA: Diagnosis not present

## 2023-11-29 DIAGNOSIS — R109 Unspecified abdominal pain: Secondary | ICD-10-CM | POA: Diagnosis not present

## 2023-11-29 DIAGNOSIS — M7989 Other specified soft tissue disorders: Secondary | ICD-10-CM | POA: Diagnosis not present

## 2023-11-29 DIAGNOSIS — Z7982 Long term (current) use of aspirin: Secondary | ICD-10-CM | POA: Insufficient documentation

## 2023-11-29 DIAGNOSIS — E11621 Type 2 diabetes mellitus with foot ulcer: Secondary | ICD-10-CM | POA: Diagnosis not present

## 2023-11-29 DIAGNOSIS — L97519 Non-pressure chronic ulcer of other part of right foot with unspecified severity: Secondary | ICD-10-CM | POA: Diagnosis not present

## 2023-11-29 DIAGNOSIS — R1033 Periumbilical pain: Secondary | ICD-10-CM | POA: Diagnosis not present

## 2023-11-29 DIAGNOSIS — N186 End stage renal disease: Secondary | ICD-10-CM | POA: Insufficient documentation

## 2023-11-29 DIAGNOSIS — Z992 Dependence on renal dialysis: Secondary | ICD-10-CM | POA: Diagnosis not present

## 2023-11-29 DIAGNOSIS — I120 Hypertensive chronic kidney disease with stage 5 chronic kidney disease or end stage renal disease: Secondary | ICD-10-CM | POA: Diagnosis not present

## 2023-11-29 DIAGNOSIS — Z79899 Other long term (current) drug therapy: Secondary | ICD-10-CM | POA: Insufficient documentation

## 2023-11-29 DIAGNOSIS — Z794 Long term (current) use of insulin: Secondary | ICD-10-CM | POA: Diagnosis not present

## 2023-11-29 DIAGNOSIS — F411 Generalized anxiety disorder: Secondary | ICD-10-CM | POA: Diagnosis not present

## 2023-11-29 DIAGNOSIS — E1122 Type 2 diabetes mellitus with diabetic chronic kidney disease: Secondary | ICD-10-CM | POA: Insufficient documentation

## 2023-11-29 DIAGNOSIS — R1084 Generalized abdominal pain: Secondary | ICD-10-CM | POA: Diagnosis not present

## 2023-11-29 DIAGNOSIS — I10 Essential (primary) hypertension: Secondary | ICD-10-CM | POA: Diagnosis not present

## 2023-11-29 LAB — CBC WITH DIFFERENTIAL/PLATELET
Abs Immature Granulocytes: 0.06 K/uL (ref 0.00–0.07)
Basophils Absolute: 0.1 K/uL (ref 0.0–0.1)
Basophils Relative: 1 %
Eosinophils Absolute: 0.1 K/uL (ref 0.0–0.5)
Eosinophils Relative: 1 %
HCT: 36 % (ref 36.0–46.0)
Hemoglobin: 10.9 g/dL — ABNORMAL LOW (ref 12.0–15.0)
Immature Granulocytes: 1 %
Lymphocytes Relative: 21 %
Lymphs Abs: 1.9 K/uL (ref 0.7–4.0)
MCH: 30.9 pg (ref 26.0–34.0)
MCHC: 30.3 g/dL (ref 30.0–36.0)
MCV: 102 fL — ABNORMAL HIGH (ref 80.0–100.0)
Monocytes Absolute: 0.6 K/uL (ref 0.1–1.0)
Monocytes Relative: 6 %
Neutro Abs: 6.5 K/uL (ref 1.7–7.7)
Neutrophils Relative %: 70 %
Platelets: 166 K/uL (ref 150–400)
RBC: 3.53 MIL/uL — ABNORMAL LOW (ref 3.87–5.11)
RDW: 13.7 % (ref 11.5–15.5)
WBC: 9.1 K/uL (ref 4.0–10.5)
nRBC: 0 % (ref 0.0–0.2)

## 2023-11-29 LAB — COMPREHENSIVE METABOLIC PANEL WITH GFR
ALT: 29 U/L (ref 0–44)
AST: 18 U/L (ref 15–41)
Albumin: 3.5 g/dL (ref 3.5–5.0)
Alkaline Phosphatase: 88 U/L (ref 38–126)
Anion gap: 14 (ref 5–15)
BUN: 41 mg/dL — ABNORMAL HIGH (ref 6–20)
CO2: 21 mmol/L — ABNORMAL LOW (ref 22–32)
Calcium: 9.3 mg/dL (ref 8.9–10.3)
Chloride: 98 mmol/L (ref 98–111)
Creatinine, Ser: 7.89 mg/dL — ABNORMAL HIGH (ref 0.44–1.00)
GFR, Estimated: 5 mL/min — ABNORMAL LOW (ref >60)
Glucose, Bld: 238 mg/dL — ABNORMAL HIGH (ref 70–99)
Potassium: 5.2 mmol/L — ABNORMAL HIGH (ref 3.5–5.1)
Sodium: 133 mmol/L — ABNORMAL LOW (ref 135–145)
Total Bilirubin: 0.7 mg/dL (ref 0.0–1.2)
Total Protein: 7.5 g/dL (ref 6.5–8.1)

## 2023-11-29 LAB — CBG MONITORING, ED: Glucose-Capillary: 176 mg/dL — ABNORMAL HIGH (ref 70–99)

## 2023-11-29 LAB — RESP PANEL BY RT-PCR (RSV, FLU A&B, COVID)  RVPGX2
Influenza A by PCR: NEGATIVE
Influenza B by PCR: NEGATIVE
Resp Syncytial Virus by PCR: NEGATIVE
SARS Coronavirus 2 by RT PCR: NEGATIVE

## 2023-11-29 LAB — LACTIC ACID, PLASMA: Lactic Acid, Venous: 1.5 mmol/L (ref 0.5–1.9)

## 2023-11-29 MED ORDER — DOXYCYCLINE HYCLATE 100 MG PO CAPS: 100.0000 mg | ORAL_CAPSULE | Freq: Two times a day (BID) | ORAL | 0 refills | Status: DC

## 2023-11-29 MED ORDER — ONDANSETRON 4 MG PO TBDP: 4.0000 mg | ORAL_TABLET | Freq: Once | ORAL | Status: DC | PRN

## 2023-11-29 MED ORDER — SODIUM ZIRCONIUM CYCLOSILICATE 10 G PO PACK
10.0000 g | PACK | ORAL | Status: AC
  Administered 2023-11-29: 10 g via ORAL
  Filled 2023-11-29: qty 1

## 2023-11-29 MED ORDER — ONDANSETRON HCL 4 MG PO TABS: 4.0000 mg | ORAL_TABLET | Freq: Four times a day (QID) | ORAL | 0 refills | Status: AC

## 2023-11-29 NOTE — ED Provider Notes (Signed)
 [place] EMERGENCY DEPARTMENT AT [facility] Provider Note   CSN: [id number] Arrival date & time: 11/29/23  1114     Patient presents with: Abdominal Pain   Latoya Walsh is a 63 y.o. female.    Abdominal Pain  This is a 63 year old female, history of hypertension diabetes and end-stage renal disease on dialysis.  She presents today with a complaint of abdominal pain that started when she got to dialysis, she only got about 20 minutes of dialysis because she was complaining about pain so it was discontinued and she was sent by ambulance to the emergency department.  She did have 1 episode of vomiting but has not had any fevers or chills or diarrhea and states that she still urinates 3 times per day depending on how much water  she drinks.  She has a prior history of what she describes as gallstones and has had some type of surgical procedure though she cannot exactly tell me what it was.  She did have abdominal imaging by ultrasound several times in the past but I do not see any CT scans on my evaluation of the medical record.  She also complains of having right great toe pain which has been going on for quite some time, she is under the care of podiatry and has a follow-up this coming week.  She was complaining about having some discharge from the toe.  This has been dressed and cared for.    Prior to Admission medications   Medication Sig Start Date End Date Taking? Authorizing Provider  doxycycline  (VIBRAMYCIN ) 100 MG capsule Take 1 capsule (100 mg total) by mouth 2 (two) times daily. 11/29/23  Yes Cleotilde Rogue, MD  ondansetron  (ZOFRAN ) 4 MG tablet Take 1 tablet (4 mg total) by mouth every 6 (six) hours. 11/29/23  Yes Cleotilde Rogue, MD  albuterol  (VENTOLIN  HFA) 108 [phone] Base) MCG/ACT inhaler Inhale 2 puffs into the lungs every 4 (four) hours as needed for wheezing or shortness of breath. 08/24/23   [name], [name], NP  amLODipine  (NORVASC ) 10 MG tablet Take 10 mg by mouth every  morning. 04/09/23   [provider]  aspirin  EC 81 MG tablet Take 1 tablet (81 mg total) by mouth daily. Swallow whole. 04/13/23   Sherryl Bouchard, NP  busPIRone  (BUSPAR ) 5 MG tablet TAKE ONE TABLET BY MOUTH TWICE DAILY 09/15/23   [name], [name], NP  COMFORT EZ PEN NEEDLES 32G X 4 MM MISC USE AS DIRECTED with insulin  injections 12/29/21   Therisa Benton JINNY, NP  Continuous Glucose Receiver (FREESTYLE LIBRE 3 READER) DEVI 1 Device by Does not apply route continuous. 08/24/23   [name], [name], NP  Continuous Glucose Sensor (FREESTYLE LIBRE 3 PLUS SENSOR) MISC Change sensor every 15 days. 08/24/23   [name], [name], NP  Continuous Glucose Sensor (FREESTYLE LIBRE 3 SENSOR) MISC 1 Device by Does not apply route continuous. Place 1 sensor on the skin every 14 days. Use to check glucose continuously 01/07/23   Dartha Ernst, MD  insulin  glargine (LANTUS  SOLOSTAR) 100 UNIT/ML Solostar Pen Inject 30 Units into the skin at bedtime. Patient taking differently: Inject 15 Units into the skin 2 (two) times daily. 11/02/23   [name], [name], NP  insulin  lispro (HUMALOG  KWIKPEN) 100 UNIT/ML KwikPen Inject 6 Units into the skin 2 (two) times daily. Patient taking differently: Inject 8 Units into the skin 2 (two) times daily. 08/24/23 11/23/23  [name], [name], NP  Insulin  Pen Needle (PEN NEEDLES)  31G X 5 MM MISC Use to inject insulin  once daily 11/04/20   Therisa Benton PARAS, NP  losartan  (COZAAR ) 50 MG tablet Take 1.5 tablets (75 mg total) by mouth daily. 08/24/23   [name], [name], NP  metoprolol  succinate (TOPROL -XL) 50 MG 24 hr tablet TAKE 1 TABLET BY MOUTH ONCE DAILY *PATIENT NEEDS APPOINTMENT* 09/07/23   Alvan Dorn FALCON, MD  mirtazapine  (REMERON ) 15 MG tablet TAKE 1 TABLET BY MOUTH EACH EVENING 04/12/23   [name], [name], NP  mupirocin  ointment (BACTROBAN ) 2 % Apply 1 Application topically 2 (two) times daily. 11/04/23 12/04/23  Christine Rush, DPM  mupirocin  ointment (BACTROBAN ) 2 % Apply 1 Application  topically 2 (two) times daily. 11/23/23   Christine Rush, DPM  rosuvastatin  (CRESTOR ) 10 MG tablet TAKE 1 TABLET BY MOUTH EVERY MORNING 03/10/23   [name], [name], NP  Sevelamer  Carbonate (RENVELA  PO) Take 1 packet by mouth See admin instructions. 1 packet with each meal and with snacks    [provider]    Allergies: Penicillins    Review of Systems  Gastrointestinal:  Positive for abdominal pain.  All other systems reviewed and are negative.   Updated Vital Signs BP (!) 158/97 (BP Location: Right Arm)   Pulse 87   Temp 98.5 F (36.9 C) (Oral)   Resp 18   LMP 06/23/2010   SpO2 100%   Physical Exam Vitals and nursing note reviewed.  Constitutional:      General: She is not in acute distress.    Appearance: She is well-developed.  HENT:     Head: Normocephalic and atraumatic.     Mouth/Throat:     Pharynx: No oropharyngeal exudate.  Eyes:     General: No scleral icterus.       Right eye: No discharge.        Left eye: No discharge.     Conjunctiva/sclera: Conjunctivae normal.     Pupils: Pupils are equal, round, and reactive to light.  Neck:     Thyroid : No thyromegaly.     Vascular: No JVD.  Cardiovascular:     Rate and Rhythm: Normal rate and regular rhythm.     Heart sounds: Normal heart sounds. No murmur heard.    No friction rub. No gallop.  Pulmonary:     Effort: Pulmonary effort is normal. No respiratory distress.     Breath sounds: Normal breath sounds. No wheezing or rales.  Abdominal:     General: Bowel sounds are normal. There is no distension.     Palpations: Abdomen is soft. There is no mass.     Tenderness: There is abdominal tenderness.     Comments: There is mild tenderness in the mid abdomen, there is no Murphy sign, there is no tenderness at McBurney's point, she has no guarding or peritoneal signs, her bowel sounds are normal.  Musculoskeletal:        General: No tenderness. Normal range of motion.     Cervical back: Normal range of motion  and neck supple.     Right lower leg: No edema.     Left lower leg: No edema.     Comments: Her right great toe has multiple small little ulcerations, several areas of what appears to be some necrotic black eschar, the tip of the toe does have capillary refill, there is a small amount of an open ulcer on the lateral aspect of the toe.  Lymphadenopathy:     Cervical: No cervical adenopathy.  Skin:  General: Skin is warm and dry.     Findings: No erythema or rash.  Neurological:     Mental Status: She is alert.     Coordination: Coordination normal.  Psychiatric:        Behavior: Behavior normal.     (all labs ordered are listed, but only abnormal results are displayed) Labs Reviewed  COMPREHENSIVE METABOLIC PANEL WITH GFR - Abnormal; Notable for the following components:      Result Value   Sodium 133 (*)    Potassium 5.2 (*)    CO2 21 (*)    Glucose, Bld 238 (*)    BUN 41 (*)    Creatinine, Ser 7.89 (*)    GFR, Estimated 5 (*)    All other components within normal limits  CBC WITH DIFFERENTIAL/PLATELET - Abnormal; Notable for the following components:   RBC 3.53 (*)    Hemoglobin 10.9 (*)    MCV 102.0 (*)    All other components within normal limits  CBG MONITORING, ED - Abnormal; Notable for the following components:   Glucose-Capillary 176 (*)    All other components within normal limits  RESP PANEL BY RT-PCR (RSV, FLU A&B, COVID)  [id number]  LACTIC ACID, PLASMA  LACTIC ACID, PLASMA    EKG: EKG Interpretation Date/Time:  Monday November 29 2023 11:39:55 EDT Ventricular Rate:  95 PR Interval:  166 QRS Duration:  60 QT Interval:  364 QTC Calculation: 457 R Axis:   76  Text Interpretation: Normal sinus rhythm Septal infarct , age undetermined Abnormal ECG No previous ECGs available Confirmed by Cleotilde Rogue ([id number]) on 11/29/2023 3:32:14 PM  Radiology: CT ABDOMEN PELVIS WO CONTRAST Result Date: 11/29/2023 CLINICAL DATA:  Abdomen pain nausea EXAM: CT ABDOMEN AND  PELVIS WITHOUT CONTRAST TECHNIQUE: Multidetector CT imaging of the abdomen and pelvis was performed following the standard protocol without IV contrast. RADIATION DOSE REDUCTION: This exam was performed according to the departmental dose-optimization program which includes automated exposure control, adjustment of the mA and/or kV according to patient size and/or use of iterative reconstruction technique. COMPARISON:  None Available. FINDINGS: Lower chest: Lung bases demonstrate no acute airspace disease. Vascular catheter tip in the right atrium. Trace pericardial effusion Hepatobiliary: No focal liver abnormality is seen. No gallstones, gallbladder wall thickening, or biliary dilatation. Pancreas: Unremarkable. No pancreatic ductal dilatation or surrounding inflammatory changes. Spleen: Normal in size without focal abnormality. Adrenals/Urinary Tract: Adrenal glands are normal. No hydronephrosis. Kidneys appear slightly atrophic. Bladder is unremarkable Stomach/Bowel: Stomach within normal limits. No dilated small bowel. Question mild wall thickening of the ascending colon and hepatic flexure but no significant pericolonic stranding. Fluid in the cecum. Slightly peripheral distribution of gas at the cecum, coronal series 6, image 61 through 64 Vascular/Lymphatic: Aortic atherosclerosis. No enlarged abdominal or pelvic lymph nodes. Reproductive: Uterus and bilateral adnexa are unremarkable. Other: Negative for free air or ascites Musculoskeletal: No acute or suspicious osseous abnormality IMPRESSION: 1. Question mild wall thickening of the ascending colon and hepatic flexure but no significant pericolonic stranding. Findings could be due to under distension versus mild colitis, correlate for any history of diarrhea. Fluid in the cecum with appearance of peripheral gas distribution, some of which appears non dependent and therefore raises possibility of mild pneumatosis/intramural air. Suggest correlation with  lactate levels. 2. Otherwise no CT evidence for acute intra-abdominal or pelvic abnormality. Slightly atrophic kidneys without hydronephrosis 3. Aortic atherosclerosis. Aortic Atherosclerosis (ICD10-I70.0). Electronically Signed   By: Luke Bun M.D.   On:  11/29/2023 17:31   DG Toe Great Right Result Date: 11/29/2023 CLINICAL DATA:  Evaluate for osteomyelitis EXAM: RIGHT GREAT TOE COMPARISON:  None Available. FINDINGS: Soft tissue swelling along the entire first toe. There is no evidence of fracture or dislocation. There is no evidence of focal bone abnormality however early osteomyelitis can not be excluded on current exam. Vascular calcifications. Soft tissues are unremarkable. IMPRESSION: Soft tissue swelling of first toe which can be seen in the setting of cellulitis or early osteomyelitis. No radiographic osseous lesion identified on current exam. Recommend further assessment with MRI if clinically warranted. Electronically Signed   By: Megan  Zare M.D.   On: 11/29/2023 14:09     Procedures   Medications Ordered in the ED  ondansetron  (ZOFRAN -ODT) disintegrating tablet 4 mg (has no administration in time range)  sodium zirconium cyclosilicate  (LOKELMA ) packet 10 g (has no administration in time range)                                    Medical Decision Making Amount and/or Complexity of Data Reviewed Radiology: ordered.    This patient presents to the ED for concern of abdominal pain as well as great toe pain, this involves an extensive number of treatment options, and is a complaint that carries with it a high risk of complications and morbidity.  The differential diagnosis includes multiple different causes of abdominal pain, she does not know why she has a scar in her abdomen, she does not know if anything was taken out and she cannot even tell me if she had been on peritoneal dialysis in the past.  She has mild tenderness in the mid abdomen to the point where it made her vomit and  stop dialysis which is unusual for her.  Will obtain labs and a CT scan, will also get an x-ray of the toe to make sure there is no signs of osteomyelitis.   Co morbidities / Chronic conditions that complicate the patient evaluation  Dialysis, hypertension, diabetes   Additional history obtained:  Additional history obtained from EMR External records from outside source obtained and reviewed including medical record On September 2 most recently seen by podiatry, appeared to be slow healing, supposed to be applying Bactroban  ointment and a light dressing with a surgical shoe which she has been using.  She has a follow-up this week.  She is not febrile and has no tachycardia   Lab Tests:  I Ordered, and personally interpreted labs.  The pertinent results include: Glucose of 238, creatinine is 7.89, potassium is 5.2, CBC with mild chronic anemia and no leukocytosis, lactate is normal, COVID and flu test are normal   Imaging Studies ordered:  I ordered imaging studies including CT scan of the abdomen and pelvis I independently visualized and interpreted imaging which showed some thickening of the ascending and hepatic flexure of the colon but no stranding, no acute findings of other abnormalities, though it states that there could be some pneumatosis or intramural air the patient has absolutely no tenderness on the right and a normal lactate level.  No other CT scan findings I agree with the radiologist interpretation On repeat exam the patient does not have any abdominal tenderness at all   Cardiac Monitoring: / EKG:  The patient was maintained on a cardiac monitor.  I personally viewed and interpreted the cardiac monitored which showed an underlying rhythm of: No tachycardia  Problem List / ED Course / Critical interventions / Medication management  The patient had a repeat exam and has absolutely no abdominal tenderness she is now just complaining of her right toe.  She will be  placed on doxycycline  for the toe, she has no diarrhea no abdominal tenderness and no abdominal pain at the time of discharge.  I was able to reexamine her abdomen entirely and there is no tympanitic sounds to percussion, she has normal bowel sounds, and she has no guarding or tenderness on my exam. I ordered medication including Zofran  here and doxycycline  for home Reevaluation of the patient after these medicines showed that the patient improved in fact her abdominal pain is resolved I have reviewed the patients home medicines and have made adjustments as needed    Social Determinants of Health:  The patient is a dialysis patient, potassium of 5.2, she has no signs of fluid overload and is comfortable going home.  I have recommended that she call her dialysis center for follow-up tomorrow or come back if things get worse, she is agreeable   Test / Admission - Considered:  Considered admission but the patient has no abdominal tenderness at the time of her discharge, she has no tachycardia no fever no lactic acidosis, she is agreeable to return should symptoms worsen She was also given a dose of Lokelma  prior to discharge since her potassium was right on the border of being elevated.      Final diagnoses:  Periumbilical abdominal pain  Ulcer of toe of right foot, unspecified ulcer stage [facility])    ED Discharge Orders          Ordered    doxycycline  (VIBRAMYCIN ) 100 MG capsule  2 times daily        11/29/23 1926    ondansetron  (ZOFRAN ) 4 MG tablet  Every 6 hours        11/29/23 1926               Cleotilde Rogue, MD 11/29/23 [phone]

## 2023-11-29 NOTE — ED Notes (Signed)
 Wound to toe wrapped after triple abx ointment applied per provider request.

## 2023-11-29 NOTE — Discharge Instructions (Addendum)
 Your testing today reveals that you have had a slightly elevated potassium but this is just because you have not had enough dialysis today.  Please call your office in the dialysis center first thing tomorrow morning, if you can go to dialysis tomorrow that would be great, if your symptoms get worse you can return to the emergency department.  If you do develop severe worsening abdominal pain vomiting or severe diarrhea return to the ER immediately.  Take doxycycline  twice a day to help with your toe and call your podiatrist Dr. Christine in the morning for a follow-up this week.

## 2023-11-29 NOTE — ED Provider Triage Note (Signed)
 Emergency Medicine Provider Triage Evaluation Note  Latoya Walsh , a 63 y.o. female  was evaluated in triage.  Pt complains of nausea, vomiting, headaches, muscle aches, feeling unwell.  This began abruptly while at dialysis today, she only completed about 20 minutes of her session.  She says she woke up feeling fine this morning.  She reports that she did have her right toenail removed for paronychia approximately 3 weeks ago, and was on antibiotics and finished those about 2 weeks ago.  She had a regular bowel movement today  Review of Systems  Positive: Nausea, vomiting, chills, headache Negative: Loss of consciousness  Physical Exam  BP (!) 157/94   Pulse 93   Temp 97.8 F (36.6 C) (Oral)   Resp 16   LMP 06/23/2010   SpO2 100%  Gen:   Awake, no distress   Resp:  Normal effort  MSK:   Moves extremities without difficulty  Other:  Discoloration or bruising or ecchymosis at the base of the right toe, tenderness of the toe on exam  Medical Decision Making  Medically screening exam initiated at 1:06 PM.  Appropriate orders placed.  Latoya Walsh was informed that the remainder of the evaluation will be completed by another provider, this initial triage assessment does not replace that evaluation, and the importance of remaining in the ED until their evaluation is complete.  Infection and metabolic workup ordered.  No evidence of respiratory distress or volume overload at this time.  Viral panel also ordered   Latoya Donnice JINNY, MD 11/29/23 [phone]

## 2023-11-29 NOTE — ED Triage Notes (Signed)
 PT BIB EMS from dialysis after she started having abdominal pain and nausea vomiting, only got 20 mins of dialysis before symptoms started. MWF, no missed treatments. AOX4.

## 2023-11-30 ENCOUNTER — Ambulatory Visit: Admitting: Podiatry

## 2023-11-30 DIAGNOSIS — N186 End stage renal disease: Secondary | ICD-10-CM | POA: Diagnosis not present

## 2023-12-01 DIAGNOSIS — N186 End stage renal disease: Secondary | ICD-10-CM | POA: Diagnosis not present

## 2023-12-02 ENCOUNTER — Other Ambulatory Visit: Payer: Self-pay

## 2023-12-02 DIAGNOSIS — N186 End stage renal disease: Secondary | ICD-10-CM | POA: Diagnosis not present

## 2023-12-02 NOTE — Patient Outreach (Signed)
 Complex Care Management   Visit Note  12/02/2023  Name:  Latoya Walsh MRN: [id number] DOB: May 16, 1960  Situation: Referral received for Complex Care Management related to Stroke and Diabetes with Complications, pain and discoloration to right great toe. I obtained verbal consent from Patient.  Visit completed with Patient on the phone.  Background:   Past Medical History:  Diagnosis Date   Allergic rhinitis    Anxiety    CHF (congestive heart failure) (HCC)    a. EF 30-35% by echo in 05/2020 b. EF at 45% in 09/2020   Chronic back pain    Chronic hepatitis C without hepatic coma (HCC)    COVID-19 virus infection 12/26/2019   Depression    Diabetes mellitus    Hepatitis C    Hypertension    Noncompliance    Poor appetite 07/17/2014   Substance abuse (HCC)    HX of drug use and alcohol use    Assessment: Patient Reported Symptoms:  Cognitive Cognitive Status: Alert and oriented to person, place, and time, Normal speech and language skills Cognitive/Intellectual Conditions Management [RPT]: None reported or documented in medical history or problem list   Health Maintenance Behaviors: Annual physical exam Health Facilitated by: Rest  Neurological Neurological Review of Symptoms: Not assessed    HEENT HEENT Symptoms Reported: Mouth or teeth pain HEENT Management Strategies: Routine screening HEENT Comment: patient is scheduled to have her teeth pulled on 01/13/24    Cardiovascular Cardiovascular Symptoms Reported: Not assessed    Respiratory Respiratory Symptoms Reported: Not assesed    Endocrine Endocrine Symptoms Reported: Hypoglycemia, Hyperglycemia Is patient diabetic?: Yes Is patient checking blood sugars at home?: Yes List most recent blood sugar readings, include date and time of day: FBS today 188 Endocrine Self-Management Outcome: 4 (good)  Gastrointestinal Gastrointestinal Symptoms Reported: No symptoms reported Additional Gastrointestinal Details:  Determined patient experienced abdominal pain while on diaylsis last week, EMS was activated and the patient went to the ED for evaluation, she was found to have no acute findings, patient states her symptoms resolved after having a bowel movement      Genitourinary Genitourinary Symptoms Reported: No symptoms reported Genitourinary Management Strategies: Hemodialysis Hemodialysis Schedule: M,W,F diaylsis schedule Genitourinary Self-Management Outcome: 4 (good)  Integumentary Integumentary Symptoms Reported: Wound Additional Integumentary Details: cellulitis and abscess to right great toe Skin Management Strategies: Routine screening, Dressing changes, Medication therapy Skin Self-Management Outcome: 3 (uncertain) Skin Comment: [facility] discuss delivery of her Mupirocin   Musculoskeletal Musculoskelatal Symptoms Reviewed: Not assessed        Psychosocial Psychosocial Symptoms Reported: Not assessed     Quality of Family Relationships: supportive Do you feel physically threatened by others?: No    12/02/2023    PHQ2-9 Depression Screening   [place] interest or pleasure in doing things    Feeling down, depressed, or hopeless    PHQ-2 - Total Score    Trouble falling or staying asleep, or sleeping too much    Feeling tired or having Andilyn Bettcher energy    Poor appetite or overeating     Feeling bad about yourself - or that you are a failure or have let yourself or your family down    Trouble concentrating on things, such as reading the newspaper or watching television    Moving or speaking so slowly that other people could have noticed.  Or the opposite - being so fidgety or restless that you have been moving around a lot more than usual  Thoughts that you would be better off dead, or hurting yourself in some way    PHQ2-9 Total Score    If you checked off any problems, how difficult have these problems made it for you to do your work, take care of  things at home, or get along with other people    Depression Interventions/Treatment      There were no vitals filed for this visit.  Medications Reviewed Today     Reviewed by Morgan Clayborne CROME, RN (Registered Nurse) on 12/02/23 at 1453  Med List Status: <None>   Medication Order Taking? Sig Documenting Provider Last Dose Status Informant  albuterol  (VENTOLIN  HFA) 108 (90 Base) MCG/ACT inhaler [id number]  Inhale 2 puffs into the lungs every 4 (four) hours as needed for wheezing or shortness of breath. [name], [name], NP  Active   amLODipine  (NORVASC ) 10 MG tablet [id number]  Take 10 mg by mouth every morning. [provider]  Active   aspirin  EC 81 MG tablet [id number]  Take 1 tablet (81 mg total) by mouth daily. Swallow whole. Sherryl Bouchard, NP  Active   busPIRone  (BUSPAR ) 5 MG tablet [id number]  TAKE ONE TABLET BY MOUTH TWICE DAILY [name], [name], NP  Active   COMFORT EZ PEN NEEDLES 32G X 4 MM MISC [id number]  USE AS DIRECTED with insulin  injections Therisa Benton PARAS, NP  Active Self, Pharmacy Records, Family Member  Continuous Glucose Receiver (FREESTYLE LIBRE 3 READER) DEVI [id number]  1 Device by Does not apply route continuous. [name], [name], NP  Active   Continuous Glucose Sensor (FREESTYLE LIBRE 3 PLUS SENSOR) MISC [id number]  Change sensor every 15 days. [name], [name], NP  Active   Continuous Glucose Sensor (FREESTYLE LIBRE 3 SENSOR) [place] [id number]  1 Device by Does not apply route continuous. Place 1 sensor on the skin every 14 days. Use to check glucose continuously Dartha Ernst, MD  Active Self, Pharmacy Records, Family Member           Med Note (ROBB, [place] A   Thu Jan 28, 2023  9:09 AM) Has not picked up from the pharmacy  doxycycline  (VIBRAMYCIN ) 100 MG capsule [id number]  Take 1 capsule (100 mg total) by mouth 2 (two) times daily. Cleotilde Rogue, MD  Active   insulin  glargine (LANTUS  SOLOSTAR) 100 UNIT/ML Solostar Pen [id number]  Inject 30 Units into the skin at  bedtime.  Patient taking differently: Inject 15 Units into the skin 2 (two) times daily.   [name], [name], NP  Active   insulin  lispro (HUMALOG  KWIKPEN) 100 UNIT/ML KwikPen [id number]  Inject 6 Units into the skin 2 (two) times daily.  Patient taking differently: Inject 8 Units into the skin 2 (two) times daily.   [name], [name], NP  Expired 11/23/23 2359   Insulin  Pen Needle (PEN NEEDLES) 31G X 5 MM MISC [id number]  Use to inject insulin  once daily Therisa Benton PARAS, NP  Active Self, Pharmacy Records, Family Member  losartan  (COZAAR ) 50 MG tablet [id number]  Take 1.5 tablets (75 mg total) by mouth daily. [name], [name], NP  Active   metoprolol  succinate (TOPROL -XL) 50 MG 24 hr tablet [id number]  TAKE 1 TABLET BY MOUTH ONCE DAILY *PATIENT NEEDS APPOINTMENT* Branch, Dorn FALCON, MD  Active   mirtazapine  (REMERON ) 15 MG tablet [id number]  TAKE 1 TABLET BY MOUTH EACH EVENING [name], [name], NP  Active   mupirocin  ointment (BACTROBAN ) 2 % [id number]  Apply 1 Application topically 2 (two) times  daily. Christine Rush, DPM  Active   mupirocin  ointment (BACTROBAN ) 2 % [id number]  Apply 1 Application topically 2 (two) times daily. Christine Rush, DPM  Active   ondansetron  (ZOFRAN ) 4 MG tablet [id number]  Take 1 tablet (4 mg total) by mouth every 6 (six) hours. Cleotilde Rogue, MD  Active   rosuvastatin  (CRESTOR ) 10 MG tablet [id number]  TAKE 1 TABLET BY MOUTH EVERY MORNING [name], [name], NP  Active   Sevelamer  Carbonate (RENVELA  PO) [id number]  Take 1 packet by mouth See admin instructions. 1 packet with each meal and with snacks [provider]  Active Self, Pharmacy Records, Family Member           Med Note (CRUTHIS, CHLOE C   Tue Mar 02, 2023 10:52 AM) Pt is unsure of the dose of her packets. No fill hx found.   Med List Note Gaylon Charmaine NOVAK, [place] 97/71/82 [id number]):              Recommendation:   Specialty provider follow-up with [facility]  on 12/09/23 at 11:30 AM Specialty  provider follow-up with Dr. Christine on 12/14/23 at 10:45 AM  Follow Up Plan:   Telephone follow up appointment date/time:  Thursday, October 2 at 11:30 AM  Clayborne Ly RN BSN CCM [place]  [facility], [facility] Health Nurse Care Coordinator  Direct Dial : [phone] Website: [email or web address]

## 2023-12-02 NOTE — Patient Instructions (Signed)
 Visit Information  Thank you for taking time to visit with me today. Please don't hesitate to contact me if I can be of assistance to you before our next scheduled appointment.  Your next care management appointment is by telephone on 12/23/23 at 11:30 AM  Please call the care guide team at [phone] if you need to cancel, schedule, or reschedule an appointment.   Please call 1-800-273-TALK (toll free, 24 hour hotline) if you are experiencing a Mental Health or Behavioral Health Crisis or need someone to talk to.  Clayborne Ly RN BSN CCM [place]  [facility], [facility] Health Nurse Care Coordinator  Direct Dial : [phone] Website: [email or web address]

## 2023-12-03 DIAGNOSIS — N186 End stage renal disease: Secondary | ICD-10-CM | POA: Diagnosis not present

## 2023-12-04 DIAGNOSIS — N186 End stage renal disease: Secondary | ICD-10-CM | POA: Diagnosis not present

## 2023-12-04 DIAGNOSIS — F411 Generalized anxiety disorder: Secondary | ICD-10-CM | POA: Diagnosis not present

## 2023-12-05 DIAGNOSIS — N186 End stage renal disease: Secondary | ICD-10-CM | POA: Diagnosis not present

## 2023-12-06 ENCOUNTER — Other Ambulatory Visit: Payer: Self-pay | Admitting: Family

## 2023-12-06 DIAGNOSIS — N186 End stage renal disease: Secondary | ICD-10-CM | POA: Diagnosis not present

## 2023-12-06 DIAGNOSIS — F411 Generalized anxiety disorder: Secondary | ICD-10-CM

## 2023-12-07 DIAGNOSIS — N186 End stage renal disease: Secondary | ICD-10-CM | POA: Diagnosis not present

## 2023-12-08 ENCOUNTER — Telehealth: Payer: Self-pay | Admitting: Podiatry

## 2023-12-08 DIAGNOSIS — N186 End stage renal disease: Secondary | ICD-10-CM | POA: Diagnosis not present

## 2023-12-08 DIAGNOSIS — F411 Generalized anxiety disorder: Secondary | ICD-10-CM | POA: Diagnosis not present

## 2023-12-08 NOTE — Telephone Encounter (Signed)
 Doctor doing rounds today would like to have a active conversation with you about the patients condition. He states the patients symptoms are worsening and needs guidance.

## 2023-12-09 ENCOUNTER — Ambulatory Visit (HOSPITAL_COMMUNITY)

## 2023-12-09 DIAGNOSIS — N186 End stage renal disease: Secondary | ICD-10-CM | POA: Diagnosis not present

## 2023-12-10 DIAGNOSIS — N186 End stage renal disease: Secondary | ICD-10-CM | POA: Diagnosis not present

## 2023-12-10 DIAGNOSIS — F411 Generalized anxiety disorder: Secondary | ICD-10-CM | POA: Diagnosis not present

## 2023-12-11 DIAGNOSIS — N186 End stage renal disease: Secondary | ICD-10-CM | POA: Diagnosis not present

## 2023-12-12 DIAGNOSIS — F411 Generalized anxiety disorder: Secondary | ICD-10-CM | POA: Diagnosis not present

## 2023-12-12 DIAGNOSIS — N186 End stage renal disease: Secondary | ICD-10-CM | POA: Diagnosis not present

## 2023-12-13 DIAGNOSIS — N186 End stage renal disease: Secondary | ICD-10-CM | POA: Diagnosis not present

## 2023-12-14 ENCOUNTER — Ambulatory Visit (INDEPENDENT_AMBULATORY_CARE_PROVIDER_SITE_OTHER): Admitting: Podiatry

## 2023-12-14 ENCOUNTER — Encounter: Payer: Self-pay | Admitting: Podiatry

## 2023-12-14 DIAGNOSIS — N186 End stage renal disease: Secondary | ICD-10-CM | POA: Diagnosis not present

## 2023-12-14 DIAGNOSIS — L97512 Non-pressure chronic ulcer of other part of right foot with fat layer exposed: Secondary | ICD-10-CM

## 2023-12-14 NOTE — Progress Notes (Signed)
 Patient presents for follow-up ulcer and ischemia hallux right.  Still having pain in the toe.  I asked her about what date her ABI testing was set up for.  Says she has not heard anything I checked the chart and it says it scheduled for August 25 at 3 PM at the hospital.  Continue to soak it and apply Bactroban  ointment to the wound..  Physical Exam:  Patient alert and oriented x 3.  No complaints of nausea, vomiting, fever, or chills  Vascular: DP pulses 1/4 bilateral. PT pulses 0/4 lateral.  Mild to moderate edema.  Dermatologic: Deep ulcer with ischemic and gangrenous changes hallux right .  Tender to touch.  Moderate clear drainage.  Measures 32 mm wide x 15 mm long x 3 deep.  Hallux right is dusky  Neurologic:   Musculoskeletal:   Diagnoses: 1.  Superficial ischemic ulcer right grade 1 hallux right.  Plan: -Established office visit for evaluation and management.  Level 3. - Discussed with her the ischemic hallux and ulcer.  Will have her continue doing current wound care soaking it in warm lukewarm salt water  twice daily, applying Bactroban  ointment, and a light dressing. -Gave her the information for her ABI appointment.  Told her the importance of keeping this.   Return 1 week f/u ulcer hallux right

## 2023-12-15 DIAGNOSIS — N186 End stage renal disease: Secondary | ICD-10-CM | POA: Diagnosis not present

## 2023-12-16 ENCOUNTER — Ambulatory Visit (HOSPITAL_COMMUNITY): Attending: Podiatry

## 2023-12-16 ENCOUNTER — Telehealth (HOSPITAL_COMMUNITY): Payer: Self-pay

## 2023-12-16 DIAGNOSIS — N186 End stage renal disease: Secondary | ICD-10-CM | POA: Diagnosis not present

## 2023-12-16 NOTE — Telephone Encounter (Signed)
 Received notification of patient left message to rescheduled missed appointment.   Attempted to contact patient, no response on any of the 3 numbers on file including contacts. Left message on [phone], otherwise unable to contact anyone.

## 2023-12-17 DIAGNOSIS — N186 End stage renal disease: Secondary | ICD-10-CM | POA: Diagnosis not present

## 2023-12-18 DIAGNOSIS — F411 Generalized anxiety disorder: Secondary | ICD-10-CM | POA: Diagnosis not present

## 2023-12-18 DIAGNOSIS — N186 End stage renal disease: Secondary | ICD-10-CM | POA: Diagnosis not present

## 2023-12-19 DIAGNOSIS — F411 Generalized anxiety disorder: Secondary | ICD-10-CM | POA: Diagnosis not present

## 2023-12-19 DIAGNOSIS — N186 End stage renal disease: Secondary | ICD-10-CM | POA: Diagnosis not present

## 2023-12-20 ENCOUNTER — Other Ambulatory Visit: Payer: Self-pay | Admitting: Family

## 2023-12-20 DIAGNOSIS — N186 End stage renal disease: Secondary | ICD-10-CM | POA: Diagnosis not present

## 2023-12-21 DIAGNOSIS — E1122 Type 2 diabetes mellitus with diabetic chronic kidney disease: Secondary | ICD-10-CM | POA: Diagnosis not present

## 2023-12-21 DIAGNOSIS — Z992 Dependence on renal dialysis: Secondary | ICD-10-CM | POA: Diagnosis not present

## 2023-12-21 DIAGNOSIS — N186 End stage renal disease: Secondary | ICD-10-CM | POA: Diagnosis not present

## 2023-12-23 ENCOUNTER — Other Ambulatory Visit: Payer: Self-pay

## 2023-12-23 DIAGNOSIS — N186 End stage renal disease: Secondary | ICD-10-CM | POA: Diagnosis not present

## 2023-12-23 NOTE — Patient Instructions (Signed)
 Visit Information  Thank you for taking time to visit with me today. Please don't hesitate to contact me if I can be of assistance to you before our next scheduled appointment.  Your next care management appointment is by telephone on Thursday, October 23 at 11:30 AM  Please call the care guide team at [phone] if you need to cancel, schedule, or reschedule an appointment.   Please call 1-800-273-TALK (toll free, 24 hour hotline) if you are experiencing a Mental Health or Behavioral Health Crisis or need someone to talk to.  Clayborne Ly RN BSN CCM [place]  [facility], [facility] Health Nurse Care Coordinator  Direct Dial : [phone] Website: [email or web address]

## 2023-12-23 NOTE — Patient Outreach (Signed)
 Complex Care Management   Visit Note  12/23/2023  Name:  Latoya Walsh MRN: [id number] DOB: [birth year]  Situation: Referral received for Complex Care Management related to Stroke and Diabetes with Complications, pain and discoloration to right great toe. I obtained verbal consent from Patient.  Visit completed with Patient on the phone.  Background:   Past Medical History:  Diagnosis Date   Allergic rhinitis    Anxiety    CHF (congestive heart failure) (HCC)    a. EF 30-35% by echo in 05/2020 b. EF at 45% in 09/2020   Chronic back pain    Chronic hepatitis C without hepatic coma (HCC)    COVID-19 virus infection 12/26/2019   Depression    Diabetes mellitus    Hepatitis C    Hypertension    Noncompliance    Poor appetite 07/17/2014   Substance abuse (HCC)    HX of drug use and alcohol use    Assessment: Patient Reported Symptoms:  Cognitive Cognitive Status: Alert and oriented to person, place, and time, Normal speech and language skills Cognitive/Intellectual Conditions Management [RPT]: None reported or documented in medical history or problem list   Health Maintenance Behaviors: Annual physical exam Healing Pattern: Slow Health Facilitated by: Pain control, Rest  Neurological Neurological Review of Symptoms: Not assessed    HEENT HEENT Symptoms Reported: Not assessed      Cardiovascular Cardiovascular Symptoms Reported: Other:, Swelling in legs or feet Other Cardiovascular Symptoms: Mild to moderate edema. Superficial ischemic ulcer right grade 1 hallux right Does patient have uncontrolled Hypertension?: No Cardiovascular Management Strategies: Medication therapy, Routine screening Cardiovascular Self-Management Outcome: 3 (uncertain)  Respiratory Respiratory Symptoms Reported: Not assesed    Endocrine Endocrine Symptoms Reported: Not assessed    Gastrointestinal Gastrointestinal Symptoms Reported: Not assessed      Genitourinary Genitourinary Symptoms  Reported: No symptoms reported Genitourinary Management Strategies: Hemodialysis Hemodialysis Schedule: M,W,F diaylsis schedule Hemodialysis Last Treatment: 12/22/23 Genitourinary Self-Management Outcome: 4 (good)  Integumentary Integumentary Symptoms Reported: Wound Additional Integumentary Details: Per Podiatry note: Dermatologic: Deep ulcer with ischemic and gangrenous changes hallux right . Tender to touch. Moderate clear drainage. Measures 32 mm wide x 15 mm long x 3 deep. Hallux right is dusky Skin Management Strategies: Dressing changes, Routine screening Skin Self-Management Outcome: 4 (good)  Musculoskeletal Musculoskelatal Symptoms Reviewed: Unsteady gait Additional Musculoskeletal Details: Deep ulcer with ischemic and gangrenous changes hallux right . Tender to touch. Moderate clear drainage. Measures 32 mm wide x 15 mm long x 3 deep. Hallux right is dusky Musculoskeletal Management Strategies: Medication therapy, Routine screening, Adequate rest Musculoskeletal Self-Management Outcome: 3 (uncertain) Falls in the past year?: No Number of falls in past year: 1 or less Was there an injury with Fall?: No Fall Risk Category Calculator: 0 Patient Fall Risk Level: Low Fall Risk Patient at Risk for Falls Due to: Impaired balance/gait Fall risk Follow up: Falls evaluation completed, Education provided  Psychosocial Psychosocial Symptoms Reported: No symptoms reported     Quality of Family Relationships: supportive Do you feel physically threatened by others?: No    12/23/2023    PHQ2-9 Depression Screening   Atilla Zollner interest or pleasure in doing things    Feeling down, depressed, or hopeless    PHQ-2 - Total Score    Trouble falling or staying asleep, or sleeping too much    Feeling tired or having Nadim Malia energy    Poor appetite or overeating     Feeling bad about yourself - or that you are  a failure or have let yourself or your family down    Trouble concentrating on things,  such as reading the newspaper or watching television    Moving or speaking so slowly that other people could have noticed.  Or the opposite - being so fidgety or restless that you have been moving around a lot more than usual    Thoughts that you would be better off dead, or hurting yourself in some way    PHQ2-9 Total Score    If you checked off any problems, how difficult have these problems made it for you to do your work, take care of things at home, or get along with other people    Depression Interventions/Treatment      There were no vitals filed for this visit.  Medications Reviewed Today     Reviewed by Morgan Clayborne CROME, RN (Registered Nurse) on 12/23/23 at 1137  Med List Status: <None>   Medication Order Taking? Sig Documenting Provider Last Dose Status Informant  Accu-Chek Softclix Lancets lancets [id number]  USE TO TEST BLOOD SUGAR TWICE DAILY AS DIRECTED [name], [name], NP  Active   albuterol  (VENTOLIN  HFA) 108 (90 Base) MCG/ACT inhaler [id number]  Inhale 2 puffs into the lungs every 4 (four) hours as needed for wheezing or shortness of breath. [name], [name], NP  Active   amLODipine  (NORVASC ) 10 MG tablet [id number]  Take 10 mg by mouth every morning. [provider]  Active   aspirin  EC 81 MG tablet [id number]  Take 1 tablet (81 mg total) by mouth daily. Swallow whole. Sherryl Bouchard, NP  Active   busPIRone  (BUSPAR ) 5 MG tablet [id number]  TAKE ONE TABLET BY MOUTH TWICE DAILY [name], [name], NP  Active   COMFORT EZ PEN NEEDLES 32G X 4 MM MISC [id number]  USE AS DIRECTED with insulin  injections Therisa Benton PARAS, NP  Active Self, Pharmacy Records, Family Member  Continuous Glucose Receiver (FREESTYLE LIBRE 3 READER) DEVI [id number]  1 Device by Does not apply route continuous. [name], [name], NP  Active   Continuous Glucose Sensor (FREESTYLE LIBRE 3 PLUS SENSOR) MISC [id number]  Change sensor every 15 days. [name], [name], NP  Active   Continuous Glucose Sensor  (FREESTYLE LIBRE 3 SENSOR) [place] [id number]  1 Device by Does not apply route continuous. Place 1 sensor on the skin every 14 days. Use to check glucose continuously Dartha Ernst, MD  Active Self, Pharmacy Records, Family Member           Med Note (ROBB, MELANIE A   Thu Jan 28, 2023  9:09 AM) Has not picked up from the pharmacy  doxycycline  (VIBRAMYCIN ) 100 MG capsule [id number]  Take 1 capsule (100 mg total) by mouth 2 (two) times daily. Cleotilde Rogue, MD  Active   insulin  glargine (LANTUS  SOLOSTAR) 100 UNIT/ML Solostar Pen [id number]  Inject 30 Units into the skin at bedtime.  Patient taking differently: Inject 15 Units into the skin 2 (two) times daily.   [name], [name], NP  Active   insulin  lispro (HUMALOG  KWIKPEN) 100 UNIT/ML KwikPen [id number]  Inject 6 Units into the skin 2 (two) times daily.  Patient taking differently: Inject 8 Units into the skin 2 (two) times daily.   [name], [name], NP  Expired 12/14/23 2359   Insulin  Pen Needle (PEN NEEDLES) 31G X 5 MM MISC [id number]  Use to inject insulin  once daily Therisa Benton PARAS, NP  Active Self, Pharmacy Records, Family Member  losartan  (COZAAR )  50 MG tablet [id number]  Take 1.5 tablets (75 mg total) by mouth daily. [name], [name], NP  Active   metoprolol  succinate (TOPROL -XL) 50 MG 24 hr tablet [id number]  TAKE 1 TABLET BY MOUTH ONCE DAILY *PATIENT NEEDS APPOINTMENT* Branch, Dorn FALCON, MD  Active   mirtazapine  (REMERON ) 15 MG tablet [id number]  TAKE 1 TABLET BY MOUTH EACH EVENING [name], [name], NP  Active   mupirocin  ointment (BACTROBAN ) 2 % [id number]  Apply 1 Application topically 2 (two) times daily. Christine Rush, DPM  Active   ondansetron  (ZOFRAN ) 4 MG tablet [id number]  Take 1 tablet (4 mg total) by mouth every 6 (six) hours. Cleotilde Rogue, MD  Active   rosuvastatin  (CRESTOR ) 10 MG tablet [id number]  TAKE 1 TABLET BY MOUTH EVERY MORNING [name], [name], NP  Active   Sevelamer  Carbonate (RENVELA  PO) [id number]  Take 1 packet by mouth See  admin instructions. 1 packet with each meal and with snacks [provider]  Active Self, Pharmacy Records, Family Member           Med Note (CRUTHIS, CHLOE C   Tue Mar 02, 2023 10:52 AM) Pt is unsure of the dose of her packets. No fill hx found.   Med List Note Gaylon Charmaine NOVAK, [place] 97/71/82 [id number]):              Recommendation:   Specialty provider follow-up Vascular and Vein on 12/30/23 at 3:00 PM Keep your scheduled appointment with Dr. Christine, Podiatrist scheduled for 01/04/24 at 10:45 AM  Follow Up Plan:   Telephone follow up appointment date/time:  Thursday, October 23 at 11:30 AM  Clayborne Ly RN BSN CCM [place]  [facility], [facility] Health Nurse Care Coordinator  Direct Dial : [phone] Website: [email or web address]

## 2023-12-24 DIAGNOSIS — N186 End stage renal disease: Secondary | ICD-10-CM | POA: Diagnosis not present

## 2023-12-25 DIAGNOSIS — N186 End stage renal disease: Secondary | ICD-10-CM | POA: Diagnosis not present

## 2023-12-26 DIAGNOSIS — N186 End stage renal disease: Secondary | ICD-10-CM | POA: Diagnosis not present

## 2023-12-27 DIAGNOSIS — N186 End stage renal disease: Secondary | ICD-10-CM | POA: Diagnosis not present

## 2023-12-28 ENCOUNTER — Other Ambulatory Visit: Payer: Self-pay | Admitting: "Endocrinology

## 2023-12-28 DIAGNOSIS — N186 End stage renal disease: Secondary | ICD-10-CM | POA: Diagnosis not present

## 2023-12-28 DIAGNOSIS — E1165 Type 2 diabetes mellitus with hyperglycemia: Secondary | ICD-10-CM

## 2023-12-29 DIAGNOSIS — N186 End stage renal disease: Secondary | ICD-10-CM | POA: Diagnosis not present

## 2023-12-30 ENCOUNTER — Ambulatory Visit (HOSPITAL_COMMUNITY)
Admission: RE | Admit: 2023-12-30 | Discharge: 2023-12-30 | Disposition: A | Discharge status: Home / Self Care | Source: Ambulatory Visit | Attending: Podiatry | Admitting: Podiatry

## 2023-12-30 DIAGNOSIS — N186 End stage renal disease: Secondary | ICD-10-CM | POA: Diagnosis not present

## 2023-12-30 DIAGNOSIS — I739 Peripheral vascular disease, unspecified: Secondary | ICD-10-CM | POA: Diagnosis not present

## 2023-12-30 DIAGNOSIS — S91301D Unspecified open wound, right foot, subsequent encounter: Secondary | ICD-10-CM | POA: Diagnosis not present

## 2023-12-30 LAB — VAS US PAD ABI

## 2023-12-31 DIAGNOSIS — N186 End stage renal disease: Secondary | ICD-10-CM | POA: Diagnosis not present

## 2024-01-01 DIAGNOSIS — N186 End stage renal disease: Secondary | ICD-10-CM | POA: Diagnosis not present

## 2024-01-02 DIAGNOSIS — N186 End stage renal disease: Secondary | ICD-10-CM | POA: Diagnosis not present

## 2024-01-03 ENCOUNTER — Other Ambulatory Visit: Payer: Self-pay | Admitting: Podiatry

## 2024-01-03 DIAGNOSIS — N186 End stage renal disease: Secondary | ICD-10-CM | POA: Diagnosis not present

## 2024-01-03 DIAGNOSIS — S91301D Unspecified open wound, right foot, subsequent encounter: Secondary | ICD-10-CM

## 2024-01-04 ENCOUNTER — Ambulatory Visit (INDEPENDENT_AMBULATORY_CARE_PROVIDER_SITE_OTHER)

## 2024-01-04 ENCOUNTER — Ambulatory Visit (INDEPENDENT_AMBULATORY_CARE_PROVIDER_SITE_OTHER): Admitting: Podiatry

## 2024-01-04 ENCOUNTER — Encounter: Payer: Self-pay | Admitting: Podiatry

## 2024-01-04 DIAGNOSIS — L97523 Non-pressure chronic ulcer of other part of left foot with necrosis of muscle: Secondary | ICD-10-CM | POA: Diagnosis not present

## 2024-01-04 DIAGNOSIS — L97512 Non-pressure chronic ulcer of other part of right foot with fat layer exposed: Secondary | ICD-10-CM | POA: Diagnosis not present

## 2024-01-04 DIAGNOSIS — N186 End stage renal disease: Secondary | ICD-10-CM | POA: Diagnosis not present

## 2024-01-04 DIAGNOSIS — M2011 Hallux valgus (acquired), right foot: Secondary | ICD-10-CM

## 2024-01-04 NOTE — Progress Notes (Signed)
 Patient presents for follow-up of ulcer hallux right.  She has not noticed any change no fever chills nausea or vomiting.  She had her ABI testing done.  Still continues to soak twice a day, apply Bactroban  and a light dressing.   Physical exam:  General appearance: Pleasant, and in no acute distress. AOx3.  Vascular: Pedal pulses: DP 0/4 bilaterally, PT 0/4 bilaterally. Mild edema lower legs bilaterally. Capillary fill time diminished bilaterally.  Neurological: Grossly intact bilaterally  Dermatologic:   Deep ischemic ulceration with eschar probably some dry gangrene of the lateral aspect of the hallux right.  No improvement since last visit.  Skin in the medial aspect of the hallux is still soft and flexible.  Some duskiness mostly on the lateral and plantar aspect of the hallux.  No signs of infection.  Minimal clear drainage..   Musculoskeletal: Hallux abductovalgus deformity right  Radiographs: 3 views foot right: Normal cortexes and bone density in the distal and middle phalanx on the hallux.  No ascending osteomyelitis.  No subcutaneous emphysema noted.  Hallux abductovalgus deformity.  Calcification of the blood vessels noted.  Diagnosis: 1.  Deep ischemic ulcer hallux right. 2.  Hallux abductovalgus right  Plan: -Established office visit for evaluation and management.  Level 3. - Reviewed ABIs that were done.  Monophasic waveforms.  Vessels were noncompressible in the lower leg.  On the left toe left pressures were abnormal.  Unable to do so on the right because of the ulceration. -Discussed with her the length the toe and goes a good chance she is going to have it have to have the toe amputated. -Continue wound care soaking twice daily lukewarm salt water  with Epsom salts for 15 minutes, apply Bactroban  ointment, and light dressing.  -Will order a vascular consult to evaluate patient for thorough exam and determine if any revascularization can be attempted.  Return 2  weeks follow-up ulcer hallux right

## 2024-01-05 DIAGNOSIS — N186 End stage renal disease: Secondary | ICD-10-CM | POA: Diagnosis not present

## 2024-01-06 DIAGNOSIS — N186 End stage renal disease: Secondary | ICD-10-CM | POA: Diagnosis not present

## 2024-01-07 DIAGNOSIS — N186 End stage renal disease: Secondary | ICD-10-CM | POA: Diagnosis not present

## 2024-01-09 DIAGNOSIS — N186 End stage renal disease: Secondary | ICD-10-CM | POA: Diagnosis not present

## 2024-01-10 DIAGNOSIS — N186 End stage renal disease: Secondary | ICD-10-CM | POA: Diagnosis not present

## 2024-01-11 ENCOUNTER — Ambulatory Visit: Admitting: Podiatry

## 2024-01-11 ENCOUNTER — Encounter: Payer: Self-pay | Admitting: Podiatry

## 2024-01-11 DIAGNOSIS — B351 Tinea unguium: Secondary | ICD-10-CM | POA: Diagnosis not present

## 2024-01-11 DIAGNOSIS — M79672 Pain in left foot: Secondary | ICD-10-CM | POA: Diagnosis not present

## 2024-01-11 DIAGNOSIS — M79671 Pain in right foot: Secondary | ICD-10-CM

## 2024-01-11 DIAGNOSIS — N186 End stage renal disease: Secondary | ICD-10-CM | POA: Diagnosis not present

## 2024-01-11 NOTE — Progress Notes (Signed)
 Patient presents for evaluation and treatment of tenderness and some redness around nails feet.  Tenderness around toes with walking and wearing shoes.  Physical exam:  General appearance: Alert, pleasant, and in no acute distress.  Vascular: Pedal pulses: DP 0/4 B/L, PT 0/4 B/L. Mild edema lower legs bilaterally.  Neurologic:  Dermatologic:  Nails thickened, disfigured, discolored 1-5 BL with subungual debris.  Redness and hypertrophic nail folds along nail folds bilaterally but no signs of drainage or infection.  Musculoskeletal:     Diagnosis: 1. Painful onychomycotic nails 1 through 5 bilaterally. 2. Pain toes 1 through 5 bilaterally.  Plan: -Debrided onychomycotic nails 1 through 5 bilaterally.  Sharply debrided nails with nail clipper and reduced with a power bur.  Return 3 months [facility]

## 2024-01-12 DIAGNOSIS — N186 End stage renal disease: Secondary | ICD-10-CM | POA: Diagnosis not present

## 2024-01-13 ENCOUNTER — Other Ambulatory Visit: Payer: Self-pay

## 2024-01-13 DIAGNOSIS — E1165 Type 2 diabetes mellitus with hyperglycemia: Secondary | ICD-10-CM

## 2024-01-13 DIAGNOSIS — N186 End stage renal disease: Secondary | ICD-10-CM

## 2024-01-13 DIAGNOSIS — F330 Major depressive disorder, recurrent, mild: Secondary | ICD-10-CM | POA: Diagnosis not present

## 2024-01-13 NOTE — Patient Instructions (Signed)
 Visit Information  Thank you for taking time to visit with me today. Please don't hesitate to contact me if I can be of assistance to you before our next scheduled appointment.  Your next care management appointment is by telephone on Thursday, November 13 at 11:00 AM  Please call the care guide team at [phone] if you need to cancel, schedule, or reschedule an appointment.   Please call [phone]-TALK (toll free, 24 hour hotline) if you are experiencing a Mental Health or Behavioral Health Crisis or need someone to talk to.  Clayborne Ly RN BSN CCM [place]  [facility], [facility] Health Nurse Care Coordinator  Direct Dial : [phone] Website: [email or web address]

## 2024-01-13 NOTE — Patient Outreach (Signed)
 Complex Care Management   Visit Note  01/13/2024  Name:  Latoya Walsh MRN: [id number] DOB: December 29, 1960  Situation: Referral received for Complex Care Management related to Stroke and Diabetes with Complications, pain and discoloration to right great toe. I obtained verbal consent from Patient.  Visit completed with Patient on the phone.  Background:   Past Medical History:  Diagnosis Date   Allergic rhinitis    Anxiety    CHF (congestive heart failure) (HCC)    a. EF 30-35% by echo in 05/2020 b. EF at 45% in 09/2020   Chronic back pain    Chronic hepatitis C without hepatic coma (HCC)    COVID-19 virus infection 12/26/2019   Depression    Diabetes mellitus    Hepatitis C    Hypertension    Noncompliance    Poor appetite 07/17/2014   Substance abuse (HCC)    HX of drug use and alcohol use    Assessment: Patient Reported Symptoms:  Cognitive Cognitive Status: Alert and oriented to person, place, and time, Normal speech and language skills Cognitive/Intellectual Conditions Management [RPT]: None reported or documented in medical history or problem list   Health Maintenance Behaviors: Annual physical exam Healing Pattern: Slow Health Facilitated by: Healthy diet, Pain control, Rest  Neurological Neurological Review of Symptoms: Numbness (numbness to both feet) Neurological Management Strategies: Routine screening, Adequate rest Neurological Self-Management Outcome: 3 (uncertain)  HEENT HEENT Symptoms Reported: Mouth or teeth pain HEENT Management Strategies: Routine screening HEENT Self-Management Outcome: 3 (uncertain) HEENT Comment: patient is established with a dentist who is managing her dental care    Cardiovascular Cardiovascular Symptoms Reported: Swelling in legs or feet, Other: Other Cardiovascular Symptoms: mild to moderate edema w/superficial ischemic ulcer right grade 1 hallux Does patient have uncontrolled Hypertension?: No Cardiovascular Management  Strategies: Medication therapy, Routine screening Cardiovascular Self-Management Outcome: 3 (uncertain)  Respiratory Respiratory Symptoms Reported: Not assesed    Endocrine Endocrine Symptoms Reported: Hypoglycemia, Hyperglycemia Is patient diabetic?: Yes Is patient checking blood sugars at home?: Yes List most recent blood sugar readings, include date and time of day: FBS 241 Endocrine Self-Management Outcome: 3 (uncertain)  Gastrointestinal Gastrointestinal Symptoms Reported: Not assessed      Genitourinary Genitourinary Symptoms Reported: No symptoms reported Genitourinary Management Strategies: Hemodialysis Hemodialysis Schedule: M,W,F dialysis schedule Genitourinary Self-Management Outcome: 4 (good)  Integumentary Integumentary Symptoms Reported: Skin changes, Wound Additional Integumentary Details: Ulcer of right foot with fat layer exposed Skin Management Strategies: Dressing changes, Routine screening Skin Self-Management Outcome: 3 (uncertain)  Musculoskeletal Musculoskelatal Symptoms Reviewed: Unsteady gait Additional Musculoskeletal Details: due to foot wound Musculoskeletal Management Strategies: Routine screening, Adequate rest Musculoskeletal Self-Management Outcome: 3 (uncertain) Falls in the past year?: No Number of falls in past year: 1 or less Was there an injury with Fall?: No Fall Risk Category Calculator: 0 Patient Fall Risk Level: Low Fall Risk Patient at Risk for Falls Due to: Impaired balance/gait Fall risk Follow up: Falls evaluation completed  Psychosocial Psychosocial Symptoms Reported: Not assessed     Quality of Family Relationships: supportive Do you feel physically threatened by others?: No    01/13/2024    PHQ2-9 Depression Screening   Latoya Walsh interest or pleasure in doing things    Feeling down, depressed, or hopeless    PHQ-2 - Total Score    Trouble falling or staying asleep, or sleeping too much    Feeling tired or having Latoya Walsh energy     Poor appetite or overeating     Feeling bad  about yourself - or that you are a failure or have let yourself or your family down    Trouble concentrating on things, such as reading the newspaper or watching television    Moving or speaking so slowly that other people could have noticed.  Or the opposite - being so fidgety or restless that you have been moving around a lot more than usual    Thoughts that you would be better off dead, or hurting yourself in some way    PHQ2-9 Total Score    If you checked off any problems, how difficult have these problems made it for you to do your work, take care of things at home, or get along with other people    Depression Interventions/Treatment      There were no vitals filed for this visit.  Medications Reviewed Today     Reviewed by Morgan Clayborne CROME, RN (Registered Nurse) on 01/13/24 at 1326  Med List Status: <None>   Medication Order Taking? Sig Documenting Provider Last Dose Status Informant  Accu-Chek Softclix Lancets lancets [id number]  USE TO TEST BLOOD SUGAR TWICE DAILY AS DIRECTED [name], [name], NP  Active   albuterol  (VENTOLIN  HFA) 108 (90 Base) MCG/ACT inhaler [id number]  Inhale 2 puffs into the lungs every 4 (four) hours as needed for wheezing or shortness of breath. [name], [name], NP  Active   amLODipine  (NORVASC ) 10 MG tablet [id number]  Take 10 mg by mouth every morning. [provider]  Active   aspirin  EC 81 MG tablet [id number]  Take 1 tablet (81 mg total) by mouth daily. Swallow whole. Millikan, Megan, NP  Active   busPIRone  (BUSPAR ) 5 MG tablet [id number]  TAKE ONE TABLET BY MOUTH TWICE DAILY [name], [name], NP  Active   COMFORT EZ PEN NEEDLES 32G X 4 MM MISC [id number]  USE AS DIRECTED with insulin  injections Latoya Benton PARAS, NP  Active Self, Pharmacy Records, Family Member  Continuous Glucose Receiver (FREESTYLE LIBRE 3 READER) DEVI [id number]  1 Device by Does not apply route continuous. [name], [name], NP  Active    Continuous Glucose Sensor (FREESTYLE LIBRE 3 PLUS SENSOR) MISC [id number]  Change sensor every 15 days. [name], [name], NP  Active   Continuous Glucose Sensor (FREESTYLE LIBRE 3 SENSOR) [place] [id number]  1 Device by Does not apply route continuous. Place 1 sensor on the skin every 14 days. Use to check glucose continuously Dartha Ernst, MD  Active Self, Pharmacy Records, Family Member           Med Note (ROBB, [place] A   Thu Jan 28, 2023  9:09 AM) Has not picked up from the pharmacy  doxycycline  (VIBRAMYCIN ) 100 MG capsule [id number]  Take 1 capsule (100 mg total) by mouth 2 (two) times daily. Cleotilde Rogue, MD  Active   insulin  glargine (LANTUS  SOLOSTAR) 100 UNIT/ML Solostar Pen [id number]  Inject 30 Units into the skin at bedtime.  Patient taking differently: Inject 15 Units into the skin 2 (two) times daily.   [name], [name], NP  Active   insulin  lispro (HUMALOG  KWIKPEN) 100 UNIT/ML KwikPen [id number]  Inject 6 Units into the skin 2 (two) times daily.  Patient taking differently: Inject 8 Units into the skin 2 (two) times daily.   [name], [name], NP  Expired 01/11/24 2359   Insulin  Pen Needle (PEN NEEDLES) 31G X 5 MM MISC [id number]  Use to inject insulin  once daily Latoya Benton PARAS, NP  Active Self, Pharmacy  Records, Family Member  losartan  (COZAAR ) 50 MG tablet [id number]  Take 1.5 tablets (75 mg total) by mouth daily. [name], [name], NP  Active   metoprolol  succinate (TOPROL -XL) 50 MG 24 hr tablet [id number]  TAKE 1 TABLET BY MOUTH ONCE DAILY *PATIENT NEEDS APPOINTMENT* Branch, Dorn FALCON, MD  Active   mirtazapine  (REMERON ) 15 MG tablet [id number]  TAKE 1 TABLET BY MOUTH EACH EVENING [name], [name], NP  Active   mupirocin  ointment (BACTROBAN ) 2 % [id number]  Apply 1 Application topically 2 (two) times daily. Christine Rush, DPM  Active   ondansetron  (ZOFRAN ) 4 MG tablet [id number]  Take 1 tablet (4 mg total) by mouth every 6 (six) hours. Cleotilde Rogue, MD  Active   rosuvastatin  (CRESTOR ) 10  MG tablet [id number]  TAKE 1 TABLET BY MOUTH EVERY MORNING [name], [name], NP  Active   Sevelamer  Carbonate (RENVELA  PO) [id number]  Take 1 packet by mouth See admin instructions. 1 packet with each meal and with snacks [provider]  Active Self, Pharmacy Records, Family Member           Med Note (CRUTHIS, CHLOE C   Tue Mar 02, 2023 10:52 AM) Pt is unsure of the dose of her packets. No fill hx found.   Med List Note Gaylon Charmaine NOVAK, [place] 97/71/82 [id number]):              Recommendation:   Specialty provider follow-up on Tuesday, October 28 at 8:20 AM with  OFFICE VISIT SPECIALTY [753] Copay: $0.00   Provider: Dartha Ernst, MD Department: [facility]  Specialty provider follow-up on 01/25/24 at 10:45 AM with  EST PT 15 [330] Copay: $0.00   Provider: Christine Rush, DPM Department: [facility]  Specialty provider follow-up on 01/26/24 at 11:30 AM with  CONSULT [1001] Copay: $0.00   Provider: Court Dorn PARAS, MD Department: [facility]   Follow Up Plan:   Telephone follow up appointment date/time:  Thursday, November 13 at 11:00 AM  Clayborne Ly RN BSN CCM [place]  [facility], [facility] Health Nurse Care Coordinator  Direct Dial : [phone] Website: [email or web address]

## 2024-01-14 DIAGNOSIS — N186 End stage renal disease: Secondary | ICD-10-CM | POA: Diagnosis not present

## 2024-01-15 DIAGNOSIS — N186 End stage renal disease: Secondary | ICD-10-CM | POA: Diagnosis not present

## 2024-01-16 DIAGNOSIS — F330 Major depressive disorder, recurrent, mild: Secondary | ICD-10-CM | POA: Diagnosis not present

## 2024-01-16 DIAGNOSIS — N186 End stage renal disease: Secondary | ICD-10-CM | POA: Diagnosis not present

## 2024-01-17 DIAGNOSIS — N186 End stage renal disease: Secondary | ICD-10-CM | POA: Diagnosis not present

## 2024-01-18 ENCOUNTER — Ambulatory Visit: Admitting: "Endocrinology

## 2024-01-18 DIAGNOSIS — N186 End stage renal disease: Secondary | ICD-10-CM | POA: Diagnosis not present

## 2024-01-19 DIAGNOSIS — N186 End stage renal disease: Secondary | ICD-10-CM | POA: Diagnosis not present

## 2024-01-20 DIAGNOSIS — N186 End stage renal disease: Secondary | ICD-10-CM | POA: Diagnosis not present

## 2024-01-20 DIAGNOSIS — F330 Major depressive disorder, recurrent, mild: Secondary | ICD-10-CM | POA: Diagnosis not present

## 2024-01-21 ENCOUNTER — Other Ambulatory Visit (HOSPITAL_COMMUNITY): Payer: Self-pay

## 2024-01-21 ENCOUNTER — Telehealth: Payer: Self-pay | Admitting: Pharmacy Technician

## 2024-01-21 DIAGNOSIS — Z992 Dependence on renal dialysis: Secondary | ICD-10-CM | POA: Diagnosis not present

## 2024-01-21 DIAGNOSIS — N186 End stage renal disease: Secondary | ICD-10-CM | POA: Diagnosis not present

## 2024-01-21 DIAGNOSIS — E1122 Type 2 diabetes mellitus with diabetic chronic kidney disease: Secondary | ICD-10-CM | POA: Diagnosis not present

## 2024-01-21 NOTE — Telephone Encounter (Signed)
 To avoid a denial, she will need an appointment so that we can provide updated chart notes (less than 3 months) for Medicaid showing that she has been using the CGM.

## 2024-01-21 NOTE — Telephone Encounter (Signed)
 Pharmacy Patient Advocate Encounter   Received notification from CoverMyMeds that prior authorization for FreeStyle Libre 3 Plus Sensor  is due for renewal.   Insurance verification completed.   The patient is insured through [facility] MEDICAID. Key: [id number]  Prior Authorization form/request asks a question that requires your assistance. Please see the question below and advise accordingly. The PA will not be submitted until the necessary information is received.      **Medicaid requires updated chart notes every 3 months for CGM approval. Patients last office visit was 07/27/23. Needs appointment.**

## 2024-01-22 DIAGNOSIS — N186 End stage renal disease: Secondary | ICD-10-CM | POA: Diagnosis not present

## 2024-01-22 DIAGNOSIS — F330 Major depressive disorder, recurrent, mild: Secondary | ICD-10-CM | POA: Diagnosis not present

## 2024-01-23 DIAGNOSIS — N186 End stage renal disease: Secondary | ICD-10-CM | POA: Diagnosis not present

## 2024-01-24 DIAGNOSIS — N186 End stage renal disease: Secondary | ICD-10-CM | POA: Diagnosis not present

## 2024-01-24 NOTE — Telephone Encounter (Signed)
 I will hold off on the PA until then! Thank you!

## 2024-01-25 ENCOUNTER — Ambulatory Visit (INDEPENDENT_AMBULATORY_CARE_PROVIDER_SITE_OTHER): Admitting: Podiatry

## 2024-01-25 DIAGNOSIS — N186 End stage renal disease: Secondary | ICD-10-CM | POA: Diagnosis not present

## 2024-01-25 DIAGNOSIS — L97512 Non-pressure chronic ulcer of other part of right foot with fat layer exposed: Secondary | ICD-10-CM

## 2024-01-25 DIAGNOSIS — F330 Major depressive disorder, recurrent, mild: Secondary | ICD-10-CM | POA: Diagnosis not present

## 2024-01-25 NOTE — Progress Notes (Signed)
 Patient presents follow-up ischemic ulcer hallux right.  Because of Medicare restrictions they were not able to schedule her and for her vascular consult until April 11, 2024.  No fever chills nausea or vomiting.  Physical Exam:  Patient alert and oriented x 3.  No complaints of nausea, vomiting, fever, or chills  Vascular: DP pulses 0/4 bilateral. PT pulses 0/4 lateral.  Mild edema. Capillary fill time delayed bilaterally.  Dermatologic: Full thickness ulcer lateral hallux right 30 mm x 25 mm x 3 mm deep.  Thick eschar feels the area of the ulceration.  The vascularity of the hallux right had actually improved at the medial aspect of the hallux is the duskiness that was there previously has resolved.  The skin outside of the area of the ulcer is also much more supple than before.  Minimal drainage.  No signs of infection.  Neurologic:   Musculoskeletal: Hallux abductovalgus deformity right    Diagnoses: 1.  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  Plan: -Established office visit for evaluation and management level 3. - Lightly debrided some  loose superficial tissue from around the ulcer.  Applied antibiotic cream and a light dressing. -Wound care: Continue soaking daily lukewarm Epsom salt water  15 minutes, applying Santyl daily, and a light dressing. -Continue surgical shoe right -Unfortunately they cannot get her in at vascular for the consult because of Medicare restrictions until April 11, 2024.  I explained to her that this is not optimal however the toe does appear to be going undergoing some revascularization with no further deterioration.  Return 2 weeks f/u ulcer right

## 2024-01-26 ENCOUNTER — Telehealth: Payer: Self-pay

## 2024-01-26 ENCOUNTER — Ambulatory Visit: Admitting: Cardiovascular Disease

## 2024-01-26 DIAGNOSIS — N186 End stage renal disease: Secondary | ICD-10-CM | POA: Diagnosis not present

## 2024-01-26 NOTE — Progress Notes (Signed)
 Complex Care Management Note  Care Guide Note 01/26/2024 Name: Latoya Walsh MRN: [id number] DOB: July 08, 1960  Latoya Walsh is a 63 y.o. year old female who sees [name], Roxan BROCKS, NP for primary care. I reached out to Eleanor JINNY Nightingale by phone today to offer complex care management services.  [name] was given information about Complex Care Management services today including:   The Complex Care Management services include support from the care team which includes your Nurse Care Manager, Clinical Social Worker, or Pharmacist.  The Complex Care Management team is here to help remove barriers to the health concerns and goals most important to you. Complex Care Management services are voluntary, and the patient may decline or stop services at any time by request to their care team member.   Complex Care Management Consent Status: Patient agreed to services and verbal consent obtained.   Follow up plan:  Telephone appointment with complex care management team member scheduled for:  01/31/2024  Encounter Outcome:  Patient Scheduled  .Debbe Fuse [facility], [facility] Guide  Direct Dial : [phone]  Fax [phone]

## 2024-01-27 DIAGNOSIS — N186 End stage renal disease: Secondary | ICD-10-CM | POA: Diagnosis not present

## 2024-01-27 DIAGNOSIS — F330 Major depressive disorder, recurrent, mild: Secondary | ICD-10-CM | POA: Diagnosis not present

## 2024-01-28 DIAGNOSIS — N186 End stage renal disease: Secondary | ICD-10-CM | POA: Diagnosis not present

## 2024-01-29 DIAGNOSIS — N186 End stage renal disease: Secondary | ICD-10-CM | POA: Diagnosis not present

## 2024-01-29 DIAGNOSIS — F330 Major depressive disorder, recurrent, mild: Secondary | ICD-10-CM | POA: Diagnosis not present

## 2024-01-31 ENCOUNTER — Telehealth: Payer: Self-pay | Admitting: Licensed Clinical Social Worker

## 2024-02-03 ENCOUNTER — Other Ambulatory Visit: Payer: Self-pay

## 2024-02-03 NOTE — Patient Outreach (Signed)
 Complex Care Management   Visit Note  02/03/2024  Name:  Latoya Walsh MRN: [id number] DOB: 1961-02-10  Situation: Referral received for Complex Care Management related to Stroke and Diabetes with Complications, pain and discoloration to right great toe. I obtained verbal consent from Patient.  Visit completed with Patient on the phone.  Background:   Past Medical History:  Diagnosis Date   Allergic rhinitis    Anxiety    CHF (congestive heart failure) (HCC)    a. EF 30-35% by echo in 05/2020 b. EF at 45% in 09/2020   Chronic back pain    Chronic hepatitis C without hepatic coma (HCC)    COVID-19 virus infection 12/26/2019   Depression    Diabetes mellitus    Hepatitis C    Hypertension    Noncompliance    Poor appetite 07/17/2014   Substance abuse (HCC)    HX of drug use and alcohol use    Assessment: Patient Reported Symptoms:  Cognitive Cognitive Status: Able to follow simple commands, Normal speech and language skills Cognitive/Intellectual Conditions Management [RPT]: None reported or documented in medical history or problem list   Health Maintenance Behaviors: Annual physical exam Health Facilitated by: Healthy diet, Pain control, Rest  Neurological Neurological Review of Symptoms: Numbness Neurological Management Strategies: Medication therapy, Routine screening Neurological Self-Management Outcome: 4 (good) Neurological Comment: numbness to both feet  HEENT HEENT Symptoms Reported: Not assessed      Cardiovascular Cardiovascular Symptoms Reported: Swelling in legs or feet Does patient have uncontrolled Hypertension?: No Cardiovascular Management Strategies: Routine screening, Medication therapy, Adequate rest Cardiovascular Self-Management Outcome: 4 (good)  Respiratory Respiratory Symptoms Reported: No symptoms reported    Endocrine Endocrine Symptoms Reported: Hypoglycemia Is patient diabetic?: Yes Is patient checking blood sugars at home?: Yes List  most recent blood sugar readings, include date and time of day: FBS 194 Endocrine Self-Management Outcome: 3 (uncertain) Endocrine Comment: Reviewed and discussed patient's upcoming scheduled f/u with Endocrinology  Gastrointestinal Gastrointestinal Symptoms Reported: Not assessed      Genitourinary Genitourinary Symptoms Reported: No symptoms reported Genitourinary Management Strategies: Hemodialysis Hemodialysis Schedule: M,W,F hemodialysis schedule Hemodialysis Last Treatment: 02/02/24 Genitourinary Self-Management Outcome: 4 (good)  Integumentary Integumentary Symptoms Reported: Wound, Skin changes Additional Integumentary Details: Ulcer of right foot with fat layer exposed, healing Skin Management Strategies: Dressing changes, Routine screening (elevate extremity) Skin Self-Management Outcome: 4 (good)  Musculoskeletal Musculoskelatal Symptoms Reviewed: Unsteady gait Additional Musculoskeletal Details: right foot ulcer with exposed fat, patient instructed to wear regular shoes per Podiatrist Musculoskeletal Management Strategies: Routine screening, Adequate rest Musculoskeletal Self-Management Outcome: 4 (good)      Psychosocial Psychosocial Symptoms Reported: No symptoms reported   Major Change/Loss/Stressor/Fears (CP): Denies Quality of Family Relationships: supportive, involved, helpful Do you feel physically threatened by others?: No    02/03/2024    PHQ2-9 Depression Screening   Luverne Farone interest or pleasure in doing things    Feeling down, depressed, or hopeless    PHQ-2 - Total Score    Trouble falling or staying asleep, or sleeping too much    Feeling tired or having Latoya Walsh energy    Poor appetite or overeating     Feeling bad about yourself - or that you are a failure or have let yourself or your family down    Trouble concentrating on things, such as reading the newspaper or watching television    Moving or speaking so slowly that other people could have noticed.   Or the opposite - being so fidgety  or restless that you have been moving around a lot more than usual    Thoughts that you would be better off dead, or hurting yourself in some way    PHQ2-9 Total Score    If you checked off any problems, how difficult have these problems made it for you to do your work, take care of things at home, or get along with other people    Depression Interventions/Treatment      There were no vitals filed for this visit. Pain Scale: 0-10 Pain Score: 4  Pain Type: Other (Comment) (ulcer of right foot with fat layer exposed) Pain Location: Foot Pain Orientation: Right Pain Descriptors / Indicators: Aching, Discomfort Pain Onset: Other (Comment) (intermittent) Pain Intervention(s): Medication (See eMAR), Rest, Elevated extremity, Repositioned Multiple Pain Sites: No  Medications Reviewed Today     Reviewed by Morgan Clayborne CROME, RN (Registered Nurse) on 02/03/24 at 1107  Med List Status: <None>   Medication Order Taking? Sig Documenting Provider Last Dose Status Informant  Accu-Chek Softclix Lancets lancets [id number]  USE TO TEST BLOOD SUGAR TWICE DAILY AS DIRECTED [name], [name], NP  Active   albuterol  (VENTOLIN  HFA) 108 (90 Base) MCG/ACT inhaler [id number]  Inhale 2 puffs into the lungs every 4 (four) hours as needed for wheezing or shortness of breath. [name], [name], NP  Active   amLODipine  (NORVASC ) 10 MG tablet [id number]  Take 10 mg by mouth every morning. [provider]  Active   aspirin  EC 81 MG tablet [id number]  Take 1 tablet (81 mg total) by mouth daily. Swallow whole. Sherryl Bouchard, NP  Active   busPIRone  (BUSPAR ) 5 MG tablet [id number]  TAKE ONE TABLET BY MOUTH TWICE DAILY [name], [name], NP  Active   COMFORT EZ PEN NEEDLES 32G X 4 MM MISC [id number]  USE AS DIRECTED with insulin  injections Therisa Benton PARAS, NP  Active Self, Pharmacy Records, Family Member  Continuous Glucose Receiver (FREESTYLE LIBRE 3 READER) DEVI [id number]  1 Device by  Does not apply route continuous. [name], [name], NP  Active   Continuous Glucose Sensor (FREESTYLE LIBRE 3 PLUS SENSOR) MISC [id number]  Change sensor every 15 days. [name], [name], NP  Active   Continuous Glucose Sensor (FREESTYLE LIBRE 3 SENSOR) [place] [id number]  1 Device by Does not apply route continuous. Place 1 sensor on the skin every 14 days. Use to check glucose continuously Dartha Ernst, MD  Active Self, Pharmacy Records, Family Member           Med Note (ROBB, MELANIE A   Thu Jan 28, 2023  9:09 AM) Has not picked up from the pharmacy  doxycycline  (VIBRAMYCIN ) 100 MG capsule [id number]  Take 1 capsule (100 mg total) by mouth 2 (two) times daily. Cleotilde Rogue, MD  Active   insulin  glargine (LANTUS  SOLOSTAR) 100 UNIT/ML Solostar Pen [id number]  Inject 30 Units into the skin at bedtime.  Patient taking differently: Inject 15 Units into the skin 2 (two) times daily.   [name], [name], NP  Active   insulin  lispro (HUMALOG  KWIKPEN) 100 UNIT/ML KwikPen [id number]  Inject 6 Units into the skin 2 (two) times daily.  Patient taking differently: Inject 8 Units into the skin 2 (two) times daily.   [name], [name], NP  Expired 01/11/24 2359   Insulin  Pen Needle (PEN NEEDLES) 31G X 5 MM MISC [id number]  Use to inject insulin  once daily Therisa, Benton PARAS, NP  Active Self, Pharmacy Records, Family Member  losartan  (  COZAAR ) 50 MG tablet [id number]  Take 1.5 tablets (75 mg total) by mouth daily. [name], [name], NP  Active   metoprolol  succinate (TOPROL -XL) 50 MG 24 hr tablet [id number]  TAKE 1 TABLET BY MOUTH ONCE DAILY *PATIENT NEEDS APPOINTMENT* Branch, Dorn FALCON, MD  Active   mirtazapine  (REMERON ) 15 MG tablet [id number]  TAKE 1 TABLET BY MOUTH EACH EVENING [name], [name], NP  Active   mupirocin  ointment (BACTROBAN ) 2 % [id number]  Apply 1 Application topically 2 (two) times daily. Christine Rush, DPM  Active   ondansetron  (ZOFRAN ) 4 MG tablet [id number]  Take 1 tablet (4 mg total) by mouth every 6 (six)  hours. Cleotilde Rogue, MD  Active   rosuvastatin  (CRESTOR ) 10 MG tablet [id number]  TAKE 1 TABLET BY MOUTH EVERY MORNING [name], [name], NP  Active   Sevelamer  Carbonate (RENVELA  PO) [id number]  Take 1 packet by mouth See admin instructions. 1 packet with each meal and with snacks [provider]  Active Self, Pharmacy Records, Family Member           Med Note (CRUTHIS, CHLOE C   Tue Mar 02, 2023 10:52 AM) Pt is unsure of the dose of her packets. No fill hx found.   Med List Note Gaylon Charmaine NOVAK, [place] 97/71/82 [id number]):             Recommendation:   Specialty provider follow-up  02/08/2024 Status: Sch   Time: 2:15 PM Length: 15  Visit Type: CONSULT [1001] Copay: $0.00  Provider: Court Dorn PARAS, MD Department: [facility]    02/15/2024 Status: Sch   Time: 10:45 AM Length: 15  Visit Type: EST PT 15 [330] Copay: $0.00  Provider: Christine Rush, DPM Department: [facility]   02/24/2024 Status: Sch   Time: 10:40 AM Length: 20  Visit Type: OFFICE VISIT SPECIALTY [753] Copay: $0.00  Provider: Dartha Ernst, MD Department: [facility]   PCP Follow-up  02/22/2024 Status: Sch   Time: 10:00 AM Length: 20  Visit Type: OFFICE VISIT [8002] Copay: $0.00  Provider: Leonarda Roxan BROCKS, NP Department: [facility]   03/13/2024 Status: Sch   Time: 10:00 AM Length: 15  Visit Type: LAB VISIT [8014] Copay: $0.00  Provider: [facility] Department: [facility]   Follow Up Plan:   Telephone follow up appointment with social worker Tobias Moose BSW date/time: Tuesday, December 2 at 2:00 PM Telephone follow up appointment with nurse care manager Clayborne Ly RN date/time: Tuesday, December 9 at 1:00 PM    Clayborne Ly RN BSN CCM [place]  [facility], [facility] Health Nurse Care Coordinator  Direct Dial : [phone] Website: [email or web address]

## 2024-02-03 NOTE — Patient Instructions (Signed)
 Visit Information  Latoya Walsh was given information about Medicaid Managed Care team care coordination services as a part of their [facility] Community Plan Medicaid benefit.   If you would like to schedule transportation through your [facility], please call the following number at least 2 days in advance of your appointment: [phone]   Rides for urgent appointments can also be made after hours by calling Member Services.  Call the Behavioral Health Crisis Line at [phone], at any time, 24 hours a day, 7 days a week. If you are in danger or need immediate medical attention call 911.   Patient verbalizes understanding of instructions and care plan provided today and agrees to view in MyChart. Active MyChart status and patient understanding of how to access instructions and care plan via MyChart confirmed with patient.     Our next appointment is by telephone on Tuesday, December 9 at 1:00 PM  Please call the care guide team at [phone] if you need to cancel or reschedule your appointment.    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   On track    Problems:  Chronic Disease Management support and education needs related to DMII  Goal: Over the next 6 months the Patient will continue to work with RN Care Manager and/or Social Worker to address care management and care coordination needs related to DMII as evidenced by adherence to care management team scheduled appointments      Interventions:   Diabetes Interventions:  Evaluation of current treatment plan related to DMII, self-management and patient's adherence to plan as established by provider Assessed patient for knowledge and  understanding of the MD recommendations provided by Podiatrist, Dr. Christine  Assessment:  Full-thickness Alona grade 2 ulceration medial hallux right. Plan: -Established office visit for evaluation and management level 3. - Lightly debrided some  loose superficial tissue from around the ulcer.  Applied antibiotic cream and a light dressing. -Wound care: Continue soaking daily lukewarm Epsom salt water  15 minutes, applying Santyl daily, and a light dressing. -Continue surgical shoe right -Unfortunately they cannot get her in at vascular for the consult because of Medicare restrictions until April 11, 2024.  I explained to her that this is not optimal however the toe does appear to be going undergoing some revascularization with no further deterioration. Return 2 weeks f/u ulcer right Discussed patient was instructed by Dr. Christine to wear her regular shoes, she is no longer wearing the boot Discussed pain is minimal and intermittent, patient is pleased with how the wound is healing Educated patient re: ankle pumps and encouraged her to perform these exercises as often as she can  Assessed patient's understanding of A1c goal: <7% Reviewed medications with patient and discussed importance of medication adherence Assessed for signs/symptoms related to hypo and hyperglycemia and importance of correct treatment Reviewed and discussed patient's next upcoming scheduled appointments and confirm she has transportation arranged  Discussed plans with patient for ongoing care management follow up and provided patient with direct contact information for care management team Lab Results  Component Value Date   HGBA1C 9.7 (H) 11/09/2023    Patient Self-Care Activities:  Attend all scheduled provider appointments Call pharmacy for medication refills 3-7  days in advance of running out of medications Call provider office for new concerns or questions  Take medications as prescribed, completing your full  course of antibiotics  Soak your right great toe twice daily for 15 minutes in warm Epsom salt water , apply Bactroban  ointment and a light dressing Work with the nurse care manager to address care coordination needs and will continue to work with the clinical team to address health care and disease management related needs  Recommendation:   Specialty provider follow-up  02/08/2024 Status: Sch   Time: 2:15 PM Length: 15  Visit Type: CONSULT [1001] Copay: $0.00  Provider: Court Dorn PARAS, MD Department: [facility]    02/15/2024 Status: Sch   Time: 10:45 AM Length: 15  Visit Type: EST PT 15 [330] Copay: $0.00  Provider: Christine Rush, DPM Department: [facility]   02/24/2024 Status: Sch   Time: 10:40 AM Length: 20  Visit Type: OFFICE VISIT SPECIALTY [753] Copay: $0.00  Provider: Dartha Ernst, MD Department: [facility]   PCP Follow-up  02/22/2024 Status: Sch   Time: 10:00 AM Length: 20  Visit Type: OFFICE VISIT [8002] Copay: $0.00  Provider: Leonarda Roxan BROCKS, NP Department: [facility]   03/13/2024 Status: Sch   Time: 10:00 AM Length: 15  Visit Type: LAB VISIT [8014] Copay: $0.00  Provider: [facility] Department: [facility]   Follow Up Plan:   Telephone follow up appointment with social worker Tobias Moose BSW date/time: Tuesday, December 2 at 2:00 PM Telephone follow up appointment with nurse care manager Clayborne Ly RN date/time: Tuesday, December 9 at 1:00 PM

## 2024-02-08 ENCOUNTER — Ambulatory Visit: Attending: Cardiovascular Disease | Admitting: Cardiovascular Disease

## 2024-02-08 ENCOUNTER — Telehealth: Payer: Self-pay | Admitting: Licensed Clinical Social Worker

## 2024-02-08 ENCOUNTER — Telehealth: Payer: Self-pay | Admitting: *Deleted

## 2024-02-08 ENCOUNTER — Encounter: Payer: Self-pay | Admitting: Cardiovascular Disease

## 2024-02-08 VITALS — BP 186/82 | HR 87 | Ht 67.0 in | Wt 165.8 lb

## 2024-02-08 DIAGNOSIS — I739 Peripheral vascular disease, unspecified: Secondary | ICD-10-CM

## 2024-02-08 DIAGNOSIS — S91301D Unspecified open wound, right foot, subsequent encounter: Secondary | ICD-10-CM

## 2024-02-08 NOTE — Patient Instructions (Signed)
 Medication Instructions:  Your physician recommends that you continue on your current medications as directed. Please refer to the Current Medication list given to you today.  *If you need a refill on your cardiac medications before your next appointment, please call your pharmacy*   Follow-Up: At [facility], you and your health needs are our priority.  As part of our continuing mission to provide you with exceptional heart care, our providers are all part of one team.  This team includes your primary Cardiologist (physician) and Advanced Practice Providers or APPs (Physician Assistants and Nurse Practitioners) who all work together to provide you with the care you need, when you need it.  Your next appointment:   We will see you on an as needed basis.  Provider:   Lauro Portal, MD   We recommend signing up for the patient portal called "MyChart".  Sign up information is provided on this After Visit Summary.  MyChart is used to connect with patients for Virtual Visits (Telemedicine).  Patients are able to view lab/test results, encounter notes, upcoming appointments, etc.  Non-urgent messages can be sent to your provider as well.   To learn more about what you can do with MyChart, go to [email or web address].

## 2024-02-08 NOTE — Telephone Encounter (Signed)
 Received PCS Form.  Placed in Dinah's Folder to fill out and sign.  To be faxed once completed.

## 2024-02-08 NOTE — Telephone Encounter (Signed)
 H&V Care Navigation CSW Progress Note  Clinical Social Worker met with patient to provide assistance with ride for appt on 11/20 as pt uses Medicaid rides and is unable to schedule a ride so close to date of appt. Discussed it would be [employer] cab and confirmed DOB and contact number. Pt now resides in [place] (previously had been in [place], we previously had coordinated [place] paramedicine prior to pt eviction), cannot read so will verbally complete waiver with her and remind her verbally of time/date of appt.  Patient is participating in a Managed Medicaid Plan:  Yes- UHC  SDOH Screenings   Food Insecurity: Food Insecurity Present (10/25/2023)  Housing: Low Risk  (10/25/2023)  Transportation Needs: Unmet Transportation Needs (01/13/2024)  Utilities: Not At Risk (10/25/2023)  Alcohol Screen: Low Risk  (12/24/2020)  Depression (PHQ2-9): Low Risk  (11/02/2023)  Recent Concern: Depression (PHQ2-9) - Medium Risk (08/24/2023)  Financial Resource Strain: Medium Risk (10/25/2023)  Physical Activity: Inactive (12/19/2020)  Social Connections: Moderately Integrated (12/19/2020)  Stress: Stress Concern Present (08/21/2021)  Tobacco Use: Medium Risk (02/08/2024)  Health Literacy: Inadequate Health Literacy (07/15/2023)    Marit Lark, MSW, LCSW Clinical Social Worker II [facility] Health Heart/Vascular Care Navigation  [phone]- work cell phone (preferred)

## 2024-02-08 NOTE — Progress Notes (Signed)
 Patient was referred for critical limb ischemia.  She is a prior patient of Dr. Lanis who I will we will refer her back to.  [name], M.D., FACP, [facility], FAHA, [facility]  [address], [address] [place], [place]  [address]  [phone] 02/08/2024 2:25 PM

## 2024-02-09 ENCOUNTER — Telehealth (HOSPITAL_BASED_OUTPATIENT_CLINIC_OR_DEPARTMENT_OTHER): Payer: Self-pay | Admitting: Licensed Clinical Social Worker

## 2024-02-09 NOTE — Telephone Encounter (Addendum)
 H&V Care Navigation CSW Progress Note  Clinical Social Worker contacted patient by phone to f/u on ride for tomorrow, vouchers sent securely to [facility] on 11/18. Pt reached today at [phone]. Currently at dialysis so asks me to call her back will do so.  2:45pm- called pt again, she was able to speak on the way home from dialysis. I completed verbal waiver wth pt. She understands to be ready by 10:00 for 10:15am pick up. No additional questions at this time.    Patient is participating in a Managed Medicaid Plan:  Yes- UHC  SDOH Screenings   Food Insecurity: Food Insecurity Present (10/25/2023)  Housing: Low Risk  (10/25/2023)  Transportation Needs: Unmet Transportation Needs (01/13/2024)  Utilities: Not At Risk (10/25/2023)  Alcohol Screen: Low Risk  (12/24/2020)  Depression (PHQ2-9): Low Risk  (11/02/2023)  Recent Concern: Depression (PHQ2-9) - Medium Risk (08/24/2023)  Financial Resource Strain: Medium Risk (10/25/2023)  Physical Activity: Inactive (12/19/2020)  Social Connections: Moderately Integrated (12/19/2020)  Stress: Stress Concern Present (08/21/2021)  Tobacco Use: Medium Risk (02/08/2024)  Health Literacy: Inadequate Health Literacy (07/15/2023)      02/09/2024  Latoya Walsh DOB: 01-Jan-1961 MRN: [id number]   RIDER WAIVER AND RELEASE OF LIABILITY  For the purposes of helping with transportation needs, [facility] partners with outside transportation providers (taxi companies, [place], catering manager.) to give [place] patients or other approved people the choice of on-demand rides Public Librarian) to our buildings for non-emergency visits.  By using [employer], I, the person signing this document, on behalf of myself and/or any legal minors (in my care using the [employer]), agree:  Science Writer given to me are supplied by independent, outside transportation providers who do not work for, or have any affiliation with, [employer]. [place] is not a  transportation company. [facility] has no control over the quality or safety of the rides I get using [employer]. [facility] has no control over whether any outside ride will happen on time or not. [facility] gives no guarantee on the reliability, quality, safety, or availability on any rides, or that no mistakes will happen. I know and accept that traveling by vehicle (car, truck, SVU, fleeta, bus, taxi, etc.) has risks of serious injuries such as disability, being paralyzed, and death. I know and agree the risk of using [employer] is mine alone, and not [employer]. [employer] are provided as is and as are available. The transportation providers are in charge for all inspections and care of the vehicles used to provide these rides. I agree not to take legal action against [facility], its agents, employees, officers, directors, representatives, insurers, attorneys, assigns, successors, subsidiaries, and affiliates at any time for any reasons related directly or indirectly to using [employer]. I also agree not to take legal action against [place] or its affiliates for any injury, death, or damage to property caused by or related to using [employer]. I have read this Waiver and Release of Liability, and I understand the terms used in it and their legal meaning. This Waiver is freely and voluntarily given with the understanding that my right (or any legal minors) to legal action against [place] relating to [employer] is knowingly given up to use these services.   I attest that I read the Ride Waiver and Release of Liability to Latoya Walsh, gave Ms. Stipe the opportunity to ask questions and answered the questions asked (if any). I affirm that  Latoya Walsh then provided consent for assistance with transportation.     Latoya Walsh

## 2024-02-10 ENCOUNTER — Other Ambulatory Visit: Payer: Self-pay | Admitting: *Deleted

## 2024-02-10 ENCOUNTER — Telehealth: Payer: Self-pay | Admitting: Licensed Clinical Social Worker

## 2024-02-10 ENCOUNTER — Encounter: Payer: Self-pay | Admitting: Vascular Surgery

## 2024-02-10 ENCOUNTER — Ambulatory Visit: Attending: Vascular Surgery | Admitting: Vascular Surgery

## 2024-02-10 VITALS — BP 160/93 | HR 95 | Temp 97.7°F | Resp 20 | Ht 67.0 in | Wt 163.6 lb

## 2024-02-10 DIAGNOSIS — R0989 Other specified symptoms and signs involving the circulatory and respiratory systems: Secondary | ICD-10-CM | POA: Diagnosis present

## 2024-02-10 DIAGNOSIS — Z992 Dependence on renal dialysis: Secondary | ICD-10-CM | POA: Insufficient documentation

## 2024-02-10 DIAGNOSIS — I70235 Atherosclerosis of native arteries of right leg with ulceration of other part of foot: Secondary | ICD-10-CM | POA: Diagnosis present

## 2024-02-10 DIAGNOSIS — N186 End stage renal disease: Secondary | ICD-10-CM | POA: Diagnosis present

## 2024-02-10 NOTE — Telephone Encounter (Signed)
 H&V Care Navigation CSW Progress Note  Clinical Social Worker received signed waiver form for pt, return ride called from [facility]. Will send waiver securely to transportation to upload to pt chart.  Patient is participating in a Managed Medicaid Plan:  Yes- uhc  SDOH Screenings   Food Insecurity: Food Insecurity Present (10/25/2023)  Housing: Low Risk  (10/25/2023)  Transportation Needs: Unmet Transportation Needs (01/13/2024)  Utilities: Not At Risk (10/25/2023)  Alcohol Screen: Low Risk  (12/24/2020)  Depression (PHQ2-9): Low Risk  (11/02/2023)  Recent Concern: Depression (PHQ2-9) - Medium Risk (08/24/2023)  Financial Resource Strain: Medium Risk (10/25/2023)  Physical Activity: Inactive (12/19/2020)  Social Connections: Moderately Integrated (12/19/2020)  Stress: Stress Concern Present (08/21/2021)  Tobacco Use: Medium Risk (02/10/2024)  Health Literacy: Inadequate Health Literacy (07/15/2023)   Marit Lark, MSW, LCSW Clinical Social Worker II [facility] Health Heart/Vascular Care Navigation  [phone]- work cell phone (preferred)

## 2024-02-10 NOTE — Progress Notes (Signed)
 Office Note     CC: Critical and ischemia Requesting Provider:  [name], Roxan BROCKS, NP  HPI: Latoya Walsh is a 63 y.o. (1960/10/30) female presenting at Latoya request of .[name], [name], NP for critical ischemia with necrotic toe.  Latoya Walsh is well-known to our practice, previously undergone left upper extremity access for end-stage renal disease complicated by steal syndrome, subsequent ligation.  At her last visit, she was not interested in further access surgeries. Patient also has history of stroke, congestive heart failure, chronic hepatitis C.  On exam, Latoya Walsh was doing well.  She stated she has had a right great toe wound since summertime.  It has failed to heal.  She has seen podiatry, specifically Dr. Norleen Coy he who did not appreciate palpable pulses.  She has also seen Dr. Court who currently referred her back to our office as I have seen her previously for renal access.  Latoya Walsh notes bilateral lower extremity claudication.  States Latoya right great toe has been discolored for a number of months.  Latoya Walsh is  on a statin for cholesterol management.  Latoya Walsh is  on a daily aspirin .   Other AC:  -   Past Medical History:  Diagnosis Date   Allergic rhinitis    Anxiety    CHF (congestive heart failure) (HCC)    a. EF 30-35% by echo in 05/2020 b. EF at 45% in 09/2020   Chronic back pain    Chronic hepatitis C without hepatic coma (HCC)    COVID-19 virus infection 12/26/2019   Depression    Diabetes mellitus    Hepatitis C    Hypertension    Noncompliance    Peripheral vascular disease    Poor appetite 07/17/2014   Substance abuse (HCC)    HX of drug use and alcohol use    Past Surgical History:  Procedure Laterality Date   APPENDECTOMY  2004   AV FISTULA PLACEMENT Left 11/26/2020   Procedure: LEFT ARM ARTERIOVENOUS (AV) FISTULA CREATION;  Surgeon: Oris Krystal FALCON, MD;  Location: [facility];  Service: Vascular;  Laterality: Left;   AV FISTULA PLACEMENT Left 06/03/2021    Procedure: INSERTION OF LEFT UPPER ARM ARTERIOVENOUS (AV) GORE-TEX GRAFT;  Surgeon: Oris Krystal FALCON, MD;  Location: [facility];  Service: Vascular;  Laterality: Left;   EXCHANGE OF A DIALYSIS CATHETER Right 02/24/2022   Procedure: EXCHANGE OF A DIALYSIS CATHETER;  Surgeon: Oris Krystal FALCON, MD;  Location: [facility];  Service: Vascular;  Laterality: Right;   IR FLUORO GUIDE CV LINE RIGHT  12/25/2020   IR US  GUIDE VASC ACCESS RIGHT  12/25/2020   LIGATION ARTERIOVENOUS GORTEX GRAFT Left 08/05/2021   Procedure: LIGATION OF LEFT ARM ARTERIOVENOUS GORTEX GRAFT;  Surgeon: Oris Krystal FALCON, MD;  Location: [facility];  Service: Vascular;  Laterality: Left;    Social History   Socioeconomic History   Marital status: Single    Spouse name: Not on file   Number of children: 3   Years of education: Not on file   Highest education level: Not on file  Occupational History   Occupation: Disabled  Tobacco Use   Smoking status: Former    Current packs/day: 0.25    Types: Cigarettes    Passive exposure: Past   Smokeless tobacco: Never  Vaping Use   Vaping status: Never Used  Substance and Sexual Activity   Alcohol use: Not Currently    Alcohol/week: 40.0 standard drinks of alcohol    Types: 40 Cans  of beer per week    Comment: 2 16oz cans of beer a day   Drug use: Not Currently    Types: Marijuana, Cocaine    Comment: cant rememeber last time used.   Sexual activity: Yes    Birth control/protection: Condom  Other Topics Concern   Not on file  Social History Narrative   Not on file   Social Drivers of Health   Financial Resource Strain: Medium Risk (10/25/2023)   Overall Financial Resource Strain (CARDIA)    Difficulty of Paying Living Expenses: Somewhat hard  Food Insecurity: Food Insecurity Present (10/25/2023)   Hunger Vital Sign    Worried About Running Out of Food in Latoya Last Year: Sometimes true    Ran Out of Food in [place] Last Year: Sometimes true  Transportation Needs: Unmet Transportation Needs  (01/13/2024)   PRAPARE - Administrator, Civil Service (Medical): Yes    Lack of Transportation (Non-Medical): Yes  Physical Activity: Inactive (12/19/2020)   Exercise Vital Sign    Days of Exercise per Week: 0 days    Minutes of Exercise per Session: 0 min  Stress: Stress Concern Present (08/21/2021)   [employer] of Occupational Health - Occupational Stress Questionnaire    Feeling of Stress : Rather much  Social Connections: Moderately Integrated (12/19/2020)   Social Connection and Isolation Panel    Frequency of Communication with Friends and Family: More than three times a week    Frequency of Social Gatherings with Friends and Family: Once a week    Attends Religious Services: More than 4 times per year    Active Member of [employer] or Organizations: Yes    Attends Banker Meetings: More than 4 times per year    Marital Status: Separated  Intimate Partner Violence: Not At Risk (07/01/2023)   Humiliation, Afraid, Rape, and Kick questionnaire    Fear of Current or Ex-Partner: No    Emotionally Abused: No    Physically Abused: No    Sexually Abused: No   Family History  Problem Relation Age of Onset   Diabetes Sister        Twin - AIDS     Current Outpatient Medications  Medication Sig Dispense Refill   Accu-Chek Softclix Lancets lancets USE TO TEST BLOOD SUGAR TWICE DAILY AS DIRECTED 100 each 11   albuterol  (VENTOLIN  HFA) 108 (90 Base) MCG/ACT inhaler Inhale 2 puffs into Latoya lungs every 4 (four) hours as needed for wheezing or shortness of breath. 18 g 1   amLODipine  (NORVASC ) 10 MG tablet Take 10 mg by mouth every morning.     aspirin  EC 81 MG tablet Take 1 tablet (81 mg total) by mouth daily. Swallow whole. 30 tablet 12   busPIRone  (BUSPAR ) 5 MG tablet TAKE ONE TABLET BY MOUTH TWICE DAILY 60 tablet 2   COMFORT EZ PEN NEEDLES 32G X 4 MM MISC USE AS DIRECTED with insulin  injections 100 each 1   Continuous Glucose Receiver (FREESTYLE LIBRE 3 READER)  DEVI 1 Device by Does not apply route continuous. 1 each 0   Continuous Glucose Sensor (FREESTYLE LIBRE 3 PLUS SENSOR) MISC Change sensor every 15 days. 6 each 3   Continuous Glucose Sensor (FREESTYLE LIBRE 3 SENSOR) MISC 1 Device by Does not apply route continuous. Place 1 sensor on Latoya skin every 14 days. Use to check glucose continuously 6 each 3   doxycycline  (VIBRAMYCIN ) 100 MG capsule Take 1 capsule (100 mg total) by mouth 2 (  two) times daily. 20 capsule 0   insulin  glargine (LANTUS  SOLOSTAR) 100 UNIT/ML Solostar Pen Inject 30 Units into Latoya skin at bedtime. (Patient taking differently: Inject 15 Units into Latoya skin 2 (two) times daily.) 15 mL 5   insulin  lispro (HUMALOG  KWIKPEN) 100 UNIT/ML KwikPen Inject 6 Units into Latoya skin 2 (two) times daily. (Patient taking differently: Inject 8 Units into Latoya skin 2 (two) times daily.) 15 mL 3   Insulin  Pen Needle (PEN NEEDLES) 31G X 5 MM MISC Use to inject insulin  once daily 90 each 3   losartan  (COZAAR ) 50 MG tablet Take 1.5 tablets (75 mg total) by mouth daily. 90 tablet 1   metoprolol  succinate (TOPROL -XL) 50 MG 24 hr tablet TAKE 1 TABLET BY MOUTH ONCE DAILY *PATIENT NEEDS APPOINTMENT* 7 tablet 0   mirtazapine  (REMERON ) 15 MG tablet TAKE 1 TABLET BY MOUTH EACH EVENING 30 tablet 10   mupirocin  ointment (BACTROBAN ) 2 % Apply 1 Application topically 2 (two) times daily. 22 g 2   ondansetron  (ZOFRAN ) 4 MG tablet Take 1 tablet (4 mg total) by mouth every 6 (six) hours. 12 tablet 0   rosuvastatin  (CRESTOR ) 10 MG tablet TAKE 1 TABLET BY MOUTH EVERY MORNING 90 tablet 3   Sevelamer  Carbonate (RENVELA  PO) Take 1 packet by mouth See admin instructions. 1 packet with each meal and with snacks     No current facility-administered medications for this visit.    Allergies  Allergen Reactions   Penicillins Itching and Rash     REVIEW OF SYSTEMS:  [X]  denotes positive finding, [ ]  denotes negative finding Cardiac  Comments:  Chest pain or chest pressure:     Shortness of breath upon exertion:    Short of breath when lying flat:    Irregular heart rhythm:        Vascular    Pain in calf, thigh, or hip brought on by ambulation:    Pain in feet at night that wakes you up from your sleep:     Blood clot in your veins:    Leg swelling:         Pulmonary    Oxygen  at home:    Productive cough:     Wheezing:         Neurologic    Sudden weakness in arms or legs:     Sudden numbness in arms or legs:     Sudden onset of difficulty speaking or slurred speech:    Temporary loss of vision in one eye:     Problems with dizziness:         Gastrointestinal    Blood in stool:     Vomited blood:         Genitourinary    Burning when urinating:     Blood in urine:        Psychiatric    Major depression:         Hematologic    Bleeding problems:    Problems with blood clotting too easily:        Skin    Rashes or ulcers:        Constitutional    Fever or chills:      PHYSICAL EXAMINATION:  Vitals:   02/10/24 1035  BP: (!) 160/93  Pulse: 95  Resp: 20  Temp: 97.7 F (36.5 C)  TempSrc: Temporal  SpO2: 97%  Weight: 163 lb 9.6 oz (74.2 kg)  Height: 5' 7 (1.702 m)    General:  WDWN in NAD; vital signs documented above Gait: Not observed HENT: WNL, normocephalic Pulmonary: normal non-labored breathing , without wheezing Cardiac: regular HR Abdomen: soft, NT, no masses Skin: without rashes Vascular Exam/Pulses:  Right Left  Radial 2+ (normal) 2+ (normal)  Ulnar    Femoral absent absent  Popliteal    DP absent absent  Walsh     Extremities: with ischemic changes, with Gangrene , without cellulitis; with open wounds;  Musculoskeletal: no muscle wasting or atrophy  Neurologic: A&O X 3;  No focal weakness or paresthesias are detected Psychiatric:  Latoya Walsh has Normal affect.   Non-Invasive Vascular Imaging:    ABI Findings:  +---------+------------------+-----+--------------+------------------------  ----+  Right    Rt Pressure (mmHg)IndexWaveform      Comment                        +---------+------------------+-----+--------------+------------------------  ----+  Brachial 187                                  Cuff placed on forearm  due                                                  to pain from scar on  upper                                                  arm                            +---------+------------------+-----+--------------+------------------------  ----+  ATA     255               1.36 monophasic                                   +---------+------------------+-----+--------------+------------------------  ----+  PTA     255               1.36 monophasic                                   +---------+------------------+-----+--------------+------------------------  ----+  Great Toe                       bandaged wound                               +---------+------------------+-----+--------------+------------------------  ----+  2nd toe                         flat                                         +---------+------------------+-----+--------------+------------------------  ----+   +---------+------------------+-----+----------+---------+  Left    Lt Pressure (mmHg)IndexWaveform  Comment    +---------+------------------+-----+----------+---------+  Brachial                                  HD access  +---------+------------------+-----+----------+---------+  ATA     255               1.36 monophasic           +---------+------------------+-----+----------+---------+  PTA     255               1.36 monophasic           +---------+------------------+-----+----------+---------+  Burnetta Drown                0.16                      +---------+------------------+-----+----------+---------+     ASSESSMENT/PLAN: Latoya Walsh is a 63 y.o. female presenting with bilateral lower extremity  critical limb ischemia with rest pain, right sided critical and ischemia with tissue loss of Latoya great toe, specifically a nonhealing wound.  On physical exam, I could not find palpable pulses in Latoya femoral arteries nor in Latoya pedal arteries. 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 No toe pressure appreciated on Latoya right.  I was very honest with Latoya Walsh that she has a bad problem, and one that places her at risk for limb loss bilaterally.  I think that she has severe inflow disease, and needs a CT angio abdomen pelvis with runoff in an effort to define what I believe is multilevel occlusive disease.  My plan is to see her as soon as this can be performed to discuss operative intervention in an effort to reestablish inline flow to Latoya foot for wound healing   Fonda FORBES Rim, MD [facility] [phone]

## 2024-02-15 ENCOUNTER — Ambulatory Visit: Admitting: Podiatry

## 2024-02-22 ENCOUNTER — Ambulatory Visit: Payer: MEDICAID | Admitting: Family

## 2024-02-22 ENCOUNTER — Other Ambulatory Visit: Payer: Self-pay | Admitting: Licensed Clinical Social Worker

## 2024-02-22 ENCOUNTER — Encounter: Payer: Self-pay | Admitting: Family

## 2024-02-22 VITALS — BP 148/76 | HR 87 | Temp 97.6°F | Resp 20 | Ht 67.0 in | Wt 166.6 lb

## 2024-02-22 DIAGNOSIS — N186 End stage renal disease: Secondary | ICD-10-CM | POA: Diagnosis not present

## 2024-02-22 DIAGNOSIS — Z794 Long term (current) use of insulin: Secondary | ICD-10-CM

## 2024-02-22 DIAGNOSIS — I5022 Chronic systolic (congestive) heart failure: Secondary | ICD-10-CM | POA: Diagnosis not present

## 2024-02-22 DIAGNOSIS — I120 Hypertensive chronic kidney disease with stage 5 chronic kidney disease or end stage renal disease: Secondary | ICD-10-CM | POA: Diagnosis not present

## 2024-02-22 DIAGNOSIS — F322 Major depressive disorder, single episode, severe without psychotic features: Secondary | ICD-10-CM | POA: Diagnosis not present

## 2024-02-22 DIAGNOSIS — F5101 Primary insomnia: Secondary | ICD-10-CM

## 2024-02-22 DIAGNOSIS — J4520 Mild intermittent asthma, uncomplicated: Secondary | ICD-10-CM

## 2024-02-22 DIAGNOSIS — E1165 Type 2 diabetes mellitus with hyperglycemia: Secondary | ICD-10-CM

## 2024-02-22 DIAGNOSIS — E785 Hyperlipidemia, unspecified: Secondary | ICD-10-CM

## 2024-02-22 DIAGNOSIS — Z23 Encounter for immunization: Secondary | ICD-10-CM | POA: Diagnosis not present

## 2024-02-22 MED ORDER — MIRTAZAPINE 30 MG PO TABS: 30.0000 mg | ORAL_TABLET | Freq: Every day | ORAL | 1 refills | Status: DC

## 2024-02-22 MED ORDER — LOSARTAN POTASSIUM 100 MG PO TABS: 100.0000 mg | ORAL_TABLET | Freq: Every day | ORAL | 1 refills | Status: DC

## 2024-02-22 NOTE — Patient Instructions (Signed)
 Visit Information  Thank you for taking time to visit with me today. Please don't hesitate to contact me if I can be of assistance to you before our next scheduled appointment.  Our next appointment is by telephone on 03/02/2024 at 2:00 pm Please call the care guide team at [phone] if you need to cancel or reschedule your appointment.   Following is a copy of your care plan:   Goals Addressed             This Visit's Progress    BSW VBCI Social Work Care Plan       Current SDOH Barriers:  Transportation  Interventions: Advised patient to talk with the dialysis center tomorrow about future transportation pick up times. Patient stated that the Medicaid transportation picked her up today for her morning appointment. Patient stated that she lives with her brother but he went to the hospital on yesterday.           Please call the Suicide and Crisis Lifeline: 988 go to [facility] [address], [place] [phone]) call 911 if you are experiencing a Mental Health or Behavioral Health Crisis or need someone to talk to.  Patient verbalized understanding of Care plan and visit instructions communicated this visit  Tobias CHARM [name], PhD [facility], [facility] Social Worker Direct Dial : [phone]  Fax: [phone]

## 2024-02-22 NOTE — Patient Outreach (Signed)
 Social Drivers of Health  Community Resource and Care Coordination Visit Note   02/22/2024  Name: Latoya Walsh MRN: [id number] DOB:09/26/1960  Situation: Referral received for [facility] needs assessment and assistance related to Transportation. I obtained verbal consent from Patient.  Visit completed with Patient on the phone.   Background:   SDOH Interventions Today    Flowsheet Row Most Recent Value  SDOH Interventions   Food Insecurity Interventions Intervention Not Indicated  Housing Interventions Intervention Not Indicated  Transportation Interventions Community Resources Provided  [Patient is going to check with the diaylsis center]  Utilities Interventions Intervention Not Indicated     Assessment:   Goals Addressed             This Visit's Progress    BSW VBCI Social Work Care Plan       Current SDOH Barriers:  Transportation  Interventions: Advised patient to talk with the dialysis center tomorrow about future transportation pick up times. Patient stated that the Medicaid transportation picked her up today for her morning appointment. Patient stated that she lives with her brother but he went to the hospital on yesterday.           Recommendation:   call for transportation assistance at least one week before appointments Talk to the transportation coordinator at the dialysis center about future transportation rides   Follow Up Plan:   Telephone follow up appointment date/time:  03/02/2024 at 2:00 pm  Tobias CHARM [name], PhD [facility], [facility] Social Worker Direct Dial : [phone]  Fax: [phone]

## 2024-02-22 NOTE — Progress Notes (Signed)
 Provider: Roxan Plough FNP-C   Elena Cothern, Roxan BROCKS, NP  Patient Care Team: Ciaran Begay, Roxan BROCKS, NP as PCP - General (Family Medicine) Alvan Dorn FALCON, MD as PCP - Cardiology (Cardiology) [facility], Georgia Little, Clayborne CROME, RN as [facility] Management Maranda Tobias BIRCH as Social Worker  Extended Emergency Contact Information Primary Emergency Contact: Flippen,Shavon Mobile Phone: [phone] Relation: Daughter Secondary Emergency Contact: Fountain,Chandy Mobile Phone: [phone] Relation: Sister  Code Status:  Full Code  Goals of care: Advanced Directive information    02/22/2024    9:45 AM  Advanced Directives  Does Patient Have a Medical Advance Directive? No  Would patient like information on creating a medical advance directive? No - Patient declined     Chief Complaint  Patient presents with   Medical Management of Chronic Issues    6 Month follow up.    Discussed the use of AI scribe software for clinical note transcription with the patient, who gave verbal consent to proceed.  History of Present Illness   Latoya Walsh is a 63 year old female with diabetes and hypertension who presents for a six-month follow-up visit.  She needs documentation of her flu shot for dialysis purposes.  She has issues with her right foot, specifically the toe, which has been bleeding. She is under the care of a podiatrist and has a follow-up appointment scheduled.  She experiences symptoms of depression, including feeling down, hopeless, and having trouble sleeping. She feels tired and has decreased energy, especially after dialysis. She also notes difficulty concentrating and restlessness. She is currently taking buspirone  5 mg twice daily for anxiety but is not on medication specifically for depression.  Her diabetes management includes taking Lantus  15 units at night and Humalog  with meals, although she sometimes forgets doses. Her blood sugar levels fluctuate, with recent  readings as low as 37. She mentions dietary challenges, including consuming foods with high sugar content.  She is on multiple medications for hypertension, including amlodipine  10 mg, losartan  75 mg, and metoprolol  50 mg daily. Her blood pressure readings have been high, with recent measurements of 154/92 and 148/76.  She has a history of mild asthma but denies recent wheezing or shortness of breath. She uses an albuterol  inhaler as needed.  She is also on rosuvastatin  10 mg for cholesterol and takes Renvela  as a phosphate binder with meals. She takes aspirin  81 mg, which she had stopped temporarily for a dental procedure.   Past Medical History:  Diagnosis Date   Allergic rhinitis    Anxiety    CHF (congestive heart failure) (HCC)    a. EF 30-35% by echo in 05/2020 b. EF at 45% in 09/2020   Chronic back pain    Chronic hepatitis C without hepatic coma (HCC)    COVID-19 virus infection 12/26/2019   Depression    Diabetes mellitus    Hepatitis C    Hypertension    Noncompliance    Peripheral vascular disease    Poor appetite 07/17/2014   Substance abuse (HCC)    HX of drug use and alcohol use   Past Surgical History:  Procedure Laterality Date   APPENDECTOMY  2004   AV FISTULA PLACEMENT Left 11/26/2020   Procedure: LEFT ARM ARTERIOVENOUS (AV) FISTULA CREATION;  Surgeon: Oris Krystal FALCON, MD;  Location: [facility];  Service: Vascular;  Laterality: Left;   AV FISTULA PLACEMENT Left 06/03/2021   Procedure: INSERTION OF LEFT UPPER ARM ARTERIOVENOUS (AV) GORE-TEX GRAFT;  Surgeon: Oris,  Krystal FALCON, MD;  Location: [facility];  Service: Vascular;  Laterality: Left;   EXCHANGE OF A DIALYSIS CATHETER Right 02/24/2022   Procedure: EXCHANGE OF A DIALYSIS CATHETER;  Surgeon: Oris Krystal FALCON, MD;  Location: [facility];  Service: Vascular;  Laterality: Right;   IR FLUORO GUIDE CV LINE RIGHT  12/25/2020   IR US  GUIDE VASC ACCESS RIGHT  12/25/2020   LIGATION ARTERIOVENOUS GORTEX GRAFT Left 08/05/2021   Procedure:  LIGATION OF LEFT ARM ARTERIOVENOUS GORTEX GRAFT;  Surgeon: Oris Krystal FALCON, MD;  Location: [facility];  Service: Vascular;  Laterality: Left;    Allergies  Allergen Reactions   Penicillins Itching and Rash    Allergies as of 02/22/2024       Reactions   Penicillins Itching, Rash        Medication List        Accurate as of February 22, 2024  8:53 PM. If you have any questions, ask your nurse or doctor.          Accu-Chek Softclix Lancets lancets USE TO TEST BLOOD SUGAR TWICE DAILY AS DIRECTED   albuterol  108 (90 Base) MCG/ACT inhaler Commonly known as: VENTOLIN  HFA Inhale 2 puffs into the lungs every 4 (four) hours as needed for wheezing or shortness of breath.   amLODipine  10 MG tablet Commonly known as: NORVASC  Take 10 mg by mouth every morning.   aspirin  EC 81 MG tablet Take 1 tablet (81 mg total) by mouth daily. Swallow whole.   busPIRone  5 MG tablet Commonly known as: BUSPAR  TAKE ONE TABLET BY MOUTH TWICE DAILY   doxycycline  100 MG capsule Commonly known as: VIBRAMYCIN  Take 1 capsule (100 mg total) by mouth 2 (two) times daily.   FreeStyle Libre 3 Reader Devi 1 Device by Does not apply route continuous.   FreeStyle Libre 3 Sensor Misc 1 Device by Does not apply route continuous. Place 1 sensor on the skin every 14 days. Use to check glucose continuously   FreeStyle Libre 3 Plus Sensor Misc Change sensor every 15 days.   insulin  lispro 100 UNIT/ML KwikPen Commonly known as: HumaLOG  KwikPen Inject 6 Units into the skin 2 (two) times daily. What changed: how much to take   Lantus  SoloStar 100 UNIT/ML Solostar Pen Generic drug: insulin  glargine Inject 30 Units into the skin at bedtime. What changed:  how much to take when to take this   losartan  100 MG tablet Commonly known as: COZAAR  Take 1 tablet (100 mg total) by mouth daily. What changed:  medication strength how much to take Changed by: Ajla Mcgeachy C Shaterica Mcclatchy   metoprolol  succinate 50 MG 24 hr  tablet Commonly known as: TOPROL -XL TAKE 1 TABLET BY MOUTH ONCE DAILY *PATIENT NEEDS APPOINTMENT*   mirtazapine  30 MG tablet Commonly known as: REMERON  Take 1 tablet (30 mg total) by mouth at bedtime. What changed:  medication strength See the new instructions. Changed by: Terriona Horlacher C Rasool Rommel   mupirocin  ointment 2 % Commonly known as: BACTROBAN  Apply 1 Application topically 2 (two) times daily.   ondansetron  4 MG tablet Commonly known as: ZOFRAN  Take 1 tablet (4 mg total) by mouth every 6 (six) hours.   Pen Needles 31G X 5 MM Misc Use to inject insulin  once daily   Comfort EZ Pen Needles 32G X 4 MM Misc Generic drug: Insulin  Pen Needle USE AS DIRECTED with insulin  injections   RENVELA  PO Take 1 packet by mouth See admin instructions. 1 packet with each meal and with snacks  rosuvastatin  10 MG tablet Commonly known as: CRESTOR  TAKE 1 TABLET BY MOUTH EVERY MORNING        Review of Systems  Constitutional:  Negative for appetite change, chills, fatigue, fever and unexpected weight change.  HENT:  Negative for congestion, dental problem, ear discharge, ear pain, hearing loss, nosebleeds, postnasal drip, rhinorrhea, sinus pressure, sinus pain, sneezing, sore throat, tinnitus and trouble swallowing.   Eyes:  Negative for pain, discharge, redness, itching and visual disturbance.  Respiratory:  Negative for cough, chest tightness, shortness of breath and wheezing.   Cardiovascular:  Negative for chest pain, palpitations and leg swelling.  Gastrointestinal:  Negative for abdominal distention, abdominal pain, blood in stool, constipation, diarrhea, nausea and vomiting.  Endocrine: Negative for cold intolerance, heat intolerance, polydipsia, polyphagia and polyuria.  Genitourinary:  Negative for difficulty urinating, dysuria, flank pain, frequency and urgency.  Musculoskeletal:  Negative for arthralgias, back pain, gait problem, joint swelling, myalgias, neck pain and neck  stiffness.  Skin:  Positive for wound. Negative for color change, pallor and rash.       Right great toe wound   Neurological:  Negative for dizziness, syncope, speech difficulty, weakness, light-headedness, numbness and headaches.  Hematological:  Does not bruise/bleed easily.  Psychiatric/Behavioral:  Negative for agitation, behavioral problems, confusion, hallucinations, self-injury, sleep disturbance and suicidal ideas. The patient is not nervous/anxious.     Immunization History  Administered Date(s) Administered   H1N1 04/04/2008   Hepb-cpg 02/07/2021, 03/14/2021, 04/09/2021, 06/04/2021   INFLUENZA, HIGH DOSE SEASONAL PF 02/22/2024   Influenza Split 02/05/2011, 12/02/2011   Influenza Whole 12/10/2006, 01/08/2009   Influenza, Mdck, Trivalent,PF 6+ MOS(egg free) 01/06/2023   Influenza, Quadrivalent, Recombinant, Inj, Pf 12/26/2021   Influenza,inj,Quad PF,6+ Mos 02/12/2014, 03/27/2015, 03/05/2020, 01/22/2021   PFIZER(Purple Top)SARS-COV-2 Vaccination 09/29/2019   Pneumococcal Conjugate-13 06/11/2014   Pneumococcal Polysaccharide-23 12/05/2004, 10/30/2010, 01/09/2022   Td 08/09/2003   Pertinent  Health Maintenance Due  Topic Date Due   OPHTHALMOLOGY EXAM  09/17/2021   FOOT EXAM  12/31/2023   HEMOGLOBIN A1C  05/11/2024   Mammogram  07/13/2024   Influenza Vaccine  Completed      10/14/2023   11:27 AM 11/02/2023    3:57 PM 12/23/2023   12:04 PM 01/13/2024   11:00 AM 02/22/2024    9:43 AM  Fall Risk  Falls in the past year? 0 0 0 0 0  Was there an injury with Fall? 0  0  0  0  0  Fall Risk Category Calculator 0 0 0 0 0  Patient at Risk for Falls Due to Impaired balance/gait No Fall Risks Impaired balance/gait Impaired balance/gait No Fall Risks  Fall risk Follow up Falls evaluation completed;Education provided Falls evaluation completed Falls evaluation completed;Education provided Falls evaluation completed Falls evaluation completed     Data saved with a previous flowsheet  row definition   Functional Status Survey:    Vitals:   02/22/24 0948 02/22/24 1000  BP: (!) 154/92 (!) 148/76  Pulse: 87   Resp: 20   Temp: 97.6 F (36.4 C)   SpO2: 98%   Weight: 166 lb 9.6 oz (75.6 kg)   Height: 5' 7 (1.702 m)    Body mass index is 26.09 kg/m. Physical Exam VITALS: BP- 154/92 MEASUREMENTS: Weight- 166. GENERAL: Alert, cooperative, well developed, no acute distress HEENT: Normocephalic, normal oropharynx, moist mucous membranes, ears normal, no infection, nose normal, no sinus tenderness NECK: Thyroid  normal CHEST: Clear to auscultation bilaterally, no wheezes, rhonchi, or crackles CARDIOVASCULAR: Normal  heart rate and rhythm, S1 and S2 normal without murmurs ABDOMEN: Soft, non-tender, non-distended, without organomegaly, normal bowel sounds EXTREMITIES: No cyanosis or edema, non-palpable pulse in right foot, toes purplish, left foot normal color, no swelling NEUROLOGICAL: Cranial nerves grossly intact, moves all extremities without gross motor or sensory deficit  SKIN: No rash,no lesion or erythema. right great toe wound dressing dry ,clean and intact wound not visualized managed by podiatrist.   PSYCHIATRY/BEHAVIORAL: Mood stable    Labs reviewed: Recent Labs    03/03/23 0541 03/03/23 0544 03/10/23 2202 03/25/23 1144 11/29/23 1623  NA  --  136 133* 139 133*  K  --  4.7 4.0 5.2 5.2*  CL  --  102 93* 103 98  CO2  --  22 24 20  21*  GLUCOSE  --  84 261* 303* 238*  BUN  --  35* 21* 55* 41*  CREATININE  --  7.91* 4.82* 8.86* 7.89*  CALCIUM   --  8.9 9.3 8.9 9.3  PHOS 5.8* 5.8*  --   --   --    Recent Labs    03/01/23 1639 03/03/23 0544 09/02/23 1000 11/29/23 1623  AST 18  --  12 18  ALT 20  --  18 29  ALKPHOS 84  --   --  88  BILITOT 0.4  --  0.6 0.7  PROT 7.4  --  7.8 7.5  ALBUMIN 3.4* 3.2*  --  3.5   Recent Labs    03/25/23 1144 09/02/23 1000 11/29/23 1623  WBC 8.6 6.4 9.1  NEUTROABS 4,971 3,674 6.5  HGB 11.2* 12.0 10.9*  HCT  34.2* 37.7 36.0  MCV 95.8 97.7 102.0*  PLT 263 210 166   Lab Results  Component Value Date   TSH 1.62 09/02/2023   Lab Results  Component Value Date   HGBA1C 9.7 (H) 11/09/2023   Lab Results  Component Value Date   CHOL 121 09/02/2023   HDL 35 (L) 09/02/2023   LDLCALC 57 09/02/2023   TRIG 220 (H) 09/02/2023   CHOLHDL 3.5 09/02/2023    Significant Diagnostic Results in last 30 days:  No results found.  Assessment/Plan  Major depressive disorder, single episode, severe Reports persistent depression, fatigue, and decreased energy, especially post-dialysis. High score on depression screening. Currently on buspirone  for anxiety, which is ineffective for depression. - Increased Remeron  to 30 mg at bedtime. 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 Current losartan  dose is insufficient. - Increased losartan  to 100 mg daily. - Scheduled follow-up in one month to reassess blood pressure.  Type 2 diabetes mellitus with hyperglycemia Blood sugar levels fluctuate significantly. Current regimen includes Lantus  and Humalog , but adherence is inconsistent. Recent A1c was 9.7, indicating poor glycemic control. - Educated on consistent use of Lantus  30 units once daily and Humalog  with all meals. - Advised dietary modifications, including switching to plain oatmeal and avoiding sugary cereals. - Ordered A1c test to reassess glycemic control.  Peripheral vascular disease of right foot with toe involvement Right foot shows diminished circulation and purplish discoloration. Has appointment with podiatry for follow-up. - Continue follow-up with podiatrist for right foot and toe management.  Hyperlipidemia Previous triglyceride level was 220, indicating poor lipid control. - dietary modification and exercise advised  - Ordered lipid panel to reassess  cholesterol levels.  Mild intermittent asthma symptoms controlled with albuterol  PRN   Primary  insomnia Reports difficulty sleeping, contributing to fatigue and low energy levels. - increase Remeron  from 15 mg tablet to 30 mg tablet daily   General health maintenance Received flu shot today. Requires documentation for dialysis. - Provided documentation of flu shot for dialysis.   Family/ staff Communication: Reviewed plan of care with patient verbalized understanding   Labs/tests ordered:  - TSH - Hgb A1C - Lipid panel   Next Appointment : Return in about 1 month (around 03/24/2024) for Major depression and , Blood pressure. cancel 12/22/2025appointment.SABRA   Spent 30 minutes of Face to face and non-face to face with patient  >50% time spent counseling; reviewing medical record; tests; labs; documentation and developing future plan of care.   Roxan JAYSON Plough, NP

## 2024-02-23 LAB — LIPID PANEL
Cholesterol: 132 mg/dL (ref <200)
HDL: 35 mg/dL — ABNORMAL LOW (ref >=50)
LDL Cholesterol (Calc): 64 mg/dL
Non-HDL Cholesterol (Calc): 97 mg/dL (ref <130)
Total CHOL/HDL Ratio: 3.8 (calc) (ref <5.0)
Triglycerides: 318 mg/dL — ABNORMAL HIGH (ref <150)

## 2024-02-23 LAB — HEMOGLOBIN A1C
Hgb A1c MFr Bld: 9.5 % — ABNORMAL HIGH (ref <5.7)
Mean Plasma Glucose: 226 mg/dL
eAG (mmol/L): 12.5 mmol/L

## 2024-02-23 LAB — TSH: TSH: 3.74 m[IU]/L (ref 0.40–4.50)

## 2024-02-24 ENCOUNTER — Ambulatory Visit (HOSPITAL_COMMUNITY): Admission: RE | Admit: 2024-02-24 | Payer: MEDICAID | Attending: Vascular Surgery

## 2024-02-24 ENCOUNTER — Ambulatory Visit: Admitting: "Endocrinology

## 2024-02-25 ENCOUNTER — Ambulatory Visit: Payer: Self-pay | Admitting: Family

## 2024-02-29 ENCOUNTER — Ambulatory Visit: Payer: MEDICAID | Admitting: Podiatry

## 2024-02-29 ENCOUNTER — Telehealth: Payer: MEDICAID | Admitting: "Endocrinology

## 2024-02-29 ENCOUNTER — Other Ambulatory Visit: Payer: Self-pay

## 2024-02-29 NOTE — Patient Outreach (Signed)
 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 I obtained verbal consent from Patient.  Visit completed with Patient on the phone.  Background:   Past Medical History:  Diagnosis Date   Allergic rhinitis    Anxiety    CHF (congestive heart failure) (HCC)    a. EF 30-35% by echo in 05/2020 b. EF at 45% in 09/2020   Chronic back pain    Chronic hepatitis C without hepatic coma (HCC)    COVID-19 virus infection 12/26/2019   Depression    Diabetes mellitus    Hepatitis C    Hypertension    Noncompliance    Peripheral vascular disease    Poor appetite 07/17/2014   Substance abuse (HCC)    HX of drug use and alcohol use    Assessment: Patient Reported Symptoms:  Cognitive Cognitive Status: Alert and oriented to person, place, and time, Normal speech and language skills Cognitive/Intellectual Conditions Management [RPT]: None reported or documented in medical history or problem list   Health Maintenance Behaviors: Annual physical exam Health Facilitated by: Rest  Neurological Neurological Review of Symptoms: Other:, Numbness Oher Neurological Symptoms/Conditions [RPT]: imparied speech secondary to past CVA; numbness to both feet Neurological Management Strategies: Routine screening Neurological Self-Management Outcome: 4 (good) Neurological Comment: reviewed upcoming scheduled procedure for CT ANGIO AO+BIFEM W/CM &/OR WO  HEENT HEENT Symptoms Reported: Not assessed      Cardiovascular Cardiovascular Symptoms Reported: Swelling in legs or feet Other Cardiovascular Symptoms: Critical limb ischemia of right lower extremity with ulceration of foot Does patient have uncontrolled Hypertension?:  No Cardiovascular Management Strategies: Routine screening, Medication therapy, Adequate rest Cardiovascular Self-Management Outcome: 3 (uncertain)  Respiratory Respiratory Symptoms Reported: No symptoms reported    Endocrine Endocrine Symptoms Reported: Hyperglycemia, Hypoglycemia Is patient diabetic?: Yes Is patient checking blood sugars at home?: Yes List most recent blood sugar readings, include date and time of day: FBS 222 Endocrine Self-Management Outcome: 3 (uncertain)  Gastrointestinal Gastrointestinal Symptoms Reported: Not assessed      Genitourinary Genitourinary Symptoms Reported: Other Other Genitourinary Symptoms: ESRD Genitourinary Management Strategies: Hemodialysis Hemodialysis Schedule: M, W, F hemodialysis schedule Hemodialysis Last Treatment: 02/28/24 Genitourinary Self-Management Outcome: 4 (good)  Integumentary Integumentary Symptoms Reported: Skin changes, Wound Additional Integumentary Details: Critical limb ischemia of right lower extremity with ulceration of foot Skin Management Strategies: Dressing changes, Routine screening Skin Self-Management Outcome: 3 (uncertain)  Musculoskeletal Musculoskelatal Symptoms Reviewed: Unsteady gait Musculoskeletal Management Strategies: Routine screening, Adequate rest Musculoskeletal Self-Management Outcome: 4 (good) Falls in the past year?: No Number of falls in past year: 1 or less Was there an injury with Fall?: No Fall Risk Category Calculator: 0 Patient Fall Risk Level: Low Fall Risk Patient at Risk for Falls Due to: Impaired balance/gait Fall risk Follow up: Education provided, Falls evaluation completed  Psychosocial Psychosocial Symptoms Reported: No symptoms reported   Major Change/Loss/Stressor/Fears (CP): Medical condition, self Techniques to Cope with Loss/Stress/Change: Diversional activities, Spiritual practice(s) Quality of Family Relationships: helpful, involved, supportive Do you feel physically  threatened by others?: No    02/29/2024    PHQ2-9 Depression Screening   Latoya Walsh interest or pleasure in doing things    Feeling down, depressed, or hopeless    PHQ-2 - Total Score  Trouble falling or staying asleep, or sleeping too much    Feeling tired or having Latoya Walsh energy    Poor appetite or overeating     Feeling bad about yourself - or that you are a failure or have let yourself or your family down    Trouble concentrating on things, such as reading the newspaper or watching television    Moving or speaking so slowly that other people could have noticed.  Or the opposite - being so fidgety or restless that you have been moving around a lot more than usual    Thoughts that you would be better off dead, or hurting yourself in some way    PHQ2-9 Total Score    If you checked off any problems, how difficult have these problems made it for you to do your work, take care of things at home, or get along with other people    Depression Interventions/Treatment      There were no vitals filed for this visit. Pain Scale: Not given for pain  Medications Reviewed Today     Reviewed by Morgan Clayborne CROME, RN (Registered Nurse) on 02/29/24 at 1323  Med List Status: <None>   Medication Order Taking? Sig Documenting Provider Last Dose Status Informant  Accu-Chek Softclix Lancets lancets [id number]  USE TO TEST BLOOD SUGAR TWICE DAILY AS DIRECTED [name], [name], NP  Active   albuterol  (VENTOLIN  HFA) 108 (90 Base) MCG/ACT inhaler [id number]  Inhale 2 puffs into the lungs every 4 (four) hours as needed for wheezing or shortness of breath. [name], [name], NP  Active   amLODipine  (NORVASC ) 10 MG tablet [id number]  Take 10 mg by mouth every morning. [provider]  Active   aspirin  EC 81 MG tablet [id number]  Take 1 tablet (81 mg total) by mouth daily. Swallow whole. Sherryl Bouchard, NP  Active   busPIRone  (BUSPAR ) 5 MG tablet [id number]  TAKE ONE TABLET BY MOUTH TWICE DAILY [name], [name],  NP  Active   COMFORT EZ PEN NEEDLES 32G X 4 MM MISC [id number]  USE AS DIRECTED with insulin  injections Therisa Benton PARAS, NP  Active Self, Pharmacy Records, Family Member  Continuous Glucose Receiver (FREESTYLE LIBRE 3 READER) [place]VI [id number]  1 Device by Does not apply route continuous. [name], [name], NP  Active   Continuous Glucose Sensor (FREESTYLE LIBRE 3 PLUS SENSOR) MISC [id number]  Change sensor every 15 days. [name], [name], NP  Active   Continuous Glucose Sensor (FREESTYLE LIBRE 3 SENSOR) [place] [id number]  1 Device by Does not apply route continuous. Place 1 sensor on the skin every 14 days. Use to check glucose continuously Obadiah Birmingham, MD  Active Self, Pharmacy Records, Family Member           Med Note (ROBB, [place] A   Thu Jan 28, 2023  9:09 AM) Has not picked up from the pharmacy  doxycycline  (VIBRAMYCIN ) 100 MG capsule [id number]  Take 1 capsule (100 mg total) by mouth 2 (two) times daily. Cleotilde Rogue, MD  Active   insulin  glargine (LANTUS  SOLOSTAR) 100 UNIT/ML Solostar Pen [id number]  Inject 30 Units into the skin at bedtime.  Patient taking differently: Inject 15 Units into the skin 2 (two) times daily.   [name], [name], NP  Active   insulin  lispro (HUMALOG  KWIKPEN) 100 UNIT/ML KwikPen [id number]  Inject 6 Units into the skin 2 (two) times daily.  Patient taking differently: Inject 8 Units into the skin 2 (two) times daily.  [name], [name], NP  Expired 02/10/24 2359   Insulin  Pen Needle (PEN NEEDLES) 31G X 5 MM MISC [id number]  Use to inject insulin  once daily Therisa Benton PARAS, NP  Active Self, Pharmacy Records, Family Member  losartan  (COZAAR ) 100 MG tablet [id number]  Take 1 tablet (100 mg total) by mouth daily. [name], [name], NP  Active   metoprolol  succinate (TOPROL -XL) 50 MG 24 hr tablet [id number]  TAKE 1 TABLET BY MOUTH ONCE DAILY *PATIENT NEEDS APPOINTMENT* Branch, Dorn FALCON, MD  Active   mirtazapine  (REMERON ) 30 MG tablet [id number]  Take 1 tablet (30 mg total) by  mouth at bedtime. [name], [name], NP  Active   mupirocin  ointment (BACTROBAN ) 2 % [id number]  Apply 1 Application topically 2 (two) times daily. Christine Rush, DPM  Active   ondansetron  (ZOFRAN ) 4 MG tablet [id number]  Take 1 tablet (4 mg total) by mouth every 6 (six) hours. Cleotilde Rogue, MD  Active   rosuvastatin  (CRESTOR ) 10 MG tablet [id number]  TAKE 1 TABLET BY MOUTH EVERY MORNING [name], [name], NP  Active   Sevelamer  Carbonate (RENVELA  PO) [id number]  Take 1 packet by mouth See admin instructions. 1 packet with each meal and with snacks [provider]  Active Self, Pharmacy Records, Family Member           Med Note (CRUTHIS, CHLOE C   Tue Mar 02, 2023 10:52 AM) Pt is unsure of the dose of her packets. No fill hx found.   Med List Note Gaylon Charmaine NOVAK, [place] 97/71/82 8392):              Recommendation:   PCP Follow-up  03/24/2024 Status: Sch   Time: 10:40 AM Length: 20  Visit Type: OFFICE VISIT [8002] Copay: $0.00  Provider: Leonarda Roxan BROCKS, NP Department: [facility]   Specialty provider follow-up   03/07/2024 Status: Sch   Time: 2:15 PM Length: 15  Visit Type: CT ANGIO AO+BIFEM W/CM &/OR WO [[id number]] Copay: $0.00  Provider: [facility] CT 2 - VIRTUAL RM Department: [facility]    03/30/2024 Status: Sch   Time: 9:40 AM Length: 20  Visit Type: FOLLOW UP VASCULAR [218] Copay: $0.00  Provider: Lanis Fonda BRAVO, MD Department: [facility]    04/06/2024 Status: Sch   Time: 9:20 AM Length: 20  Visit Type: OFFICE VISIT SPECIALTY [753] Copay: $0.00  Provider: Obadiah Birmingham, MD Department: [facility]    04/11/2024 Status: Sch   Time: 11:15 AM Length: 15  Visit Type: DIABETIC FT CARE [318] Copay: $0.00  Provider: Christine Rush, DPM Department: [facility]   Follow Up Plan:   Telephone follow up appointment date/time  03/02/2024 Status: Sch   Time: 2:00 PM Length: 30  Visit Type: PATIENT OUTREACH 30 [3016]  Copay: $0.00  Provider: Maranda Lister D Department: [facility]    03/30/2024 Status: Sch   Time: 1:00 PM Length: 30  Visit Type: VBCI TELEPHONE CALL 30 [2502] Copay: $0.00  Provider: Morgan Clayborne CROME, RN Department: [facility]   Clayborne Morgan RN BSN CCM [place]  [facility], [facility] Health Nurse Care Coordinator  Direct Dial : [phone] Website: [email or web address]

## 2024-02-29 NOTE — Patient Instructions (Signed)
 Visit Information  Thank you for taking time to visit with me today. Please don't hesitate to contact me if I can be of assistance to you before our next scheduled appointment.  Your next care management appointment is by telephone on Thursday, January 8 at 1:00 PM  Please call the care guide team at [phone] if you need to cancel, schedule, or reschedule an appointment.   Please call 1-800-273-TALK (toll free, 24 hour hotline) if you are experiencing a Mental Health or Behavioral Health Crisis or need someone to talk to.  Clayborne Ly RN BSN CCM [place]  [facility], [facility] Health Nurse Care Coordinator  Direct Dial : [phone] Website: [email or web address]

## 2024-03-02 ENCOUNTER — Ambulatory Visit: Payer: MEDICAID | Admitting: Vascular Surgery

## 2024-03-02 ENCOUNTER — Other Ambulatory Visit: Payer: MEDICAID | Admitting: Licensed Clinical Social Worker

## 2024-03-02 ENCOUNTER — Ambulatory Visit (HOSPITAL_COMMUNITY)
Admission: RE | Admit: 2024-03-02 | Discharge: 2024-03-02 | Disposition: A | Discharge status: Home / Self Care | Payer: MEDICAID | Source: Ambulatory Visit | Attending: Vascular Surgery | Admitting: Vascular Surgery

## 2024-03-02 DIAGNOSIS — I70235 Atherosclerosis of native arteries of right leg with ulceration of other part of foot: Secondary | ICD-10-CM | POA: Insufficient documentation

## 2024-03-02 MED ORDER — IOHEXOL 350 MG/ML SOLN
100.0000 mL | Freq: Once | INTRAVENOUS | Status: AC | PRN
  Administered 2024-03-02: 100 mL via INTRAVENOUS

## 2024-03-02 NOTE — Patient Outreach (Signed)
 Social Drivers of Health  Community Resource and Care Coordination Visit Note   03/02/2024  Name: Latoya Walsh MRN: [id number] DOB:June 04, 1960  Situation: Referral received for [facility] needs assessment and assistance related to Transportation. I obtained verbal consent from Patient.  Visit completed with Patient on the phone.   Background:   SDOH Interventions Today    Flowsheet Row Most Recent Value  SDOH Interventions   Food Insecurity Interventions Intervention Not Indicated  Housing Interventions Intervention Not Indicated  Transportation Interventions Intervention Not Indicated     Assessment:   Goals Addressed             This Visit's Progress    COMPLETED: BSW VBCI Social Work Care Plan       Current SDOH Barriers:  Transportation  Interventions: Advised patient to talk with the dialysis center tomorrow about future transportation pick up times. Patient stated that the Medicaid transportation picked her up today for her morning appointment. Patient stated that she lives with her brother but he went to the hospital on yesterday.           Recommendation:   attend all scheduled provider appointments call for transportation assistance at least one week before appointments Patient stated that she has all her transportation needs met. SW reminded patient about upcoming appts. Patient stated that she saw her PCP on 02/22/2024 and that her pCP was supposed to call in a RX for nerve pills for her right leg, Patient stated that she did not feel comfortable calling the PCP so the SW will send an in box message to the PCP and to the [facility]  Follow Up Plan:   Patient has achieved all patient stated goals. [employer] will be closed. Patient has been provided contact information should new needs arise.   Latoya CHARM [name], PhD [facility], [facility] Social Worker Direct Dial : [phone]  Fax:  [phone]

## 2024-03-02 NOTE — Patient Instructions (Signed)
 Visit Information  Thank you for taking time to visit with me today. Please don't hesitate to contact me if I can be of assistance to you before our next scheduled appointment.  Your next care management appointment is no further scheduled appointments.  on     Please call the care guide team at [phone] if you need to cancel, schedule, or reschedule an appointment.   Please call the Suicide and Crisis Lifeline: 988 go to [facility] Urgent [facility] [address], [place] [phone]) call 911 if you are experiencing a Mental Health or Behavioral Health Crisis or need someone to talk to.  Latoya CHARM [name], PhD [facility], [facility] Social Worker Direct Dial: [phone]  Fax: [phone]

## 2024-03-03 ENCOUNTER — Other Ambulatory Visit: Payer: Self-pay | Admitting: Family

## 2024-03-06 NOTE — Telephone Encounter (Signed)
 High risk warning

## 2024-03-07 ENCOUNTER — Ambulatory Visit: Admitting: Podiatry

## 2024-03-07 ENCOUNTER — Ambulatory Visit (HOSPITAL_COMMUNITY): Payer: MEDICAID

## 2024-03-08 ENCOUNTER — Telehealth: Payer: Self-pay

## 2024-03-08 NOTE — Telephone Encounter (Signed)
 Pharmacy Patient Advocate Encounter   Received notification from Onbase that prior authorization for Freestyle libre 3 plus sensor is required/requested.   Insurance verification completed.   The patient is insured through [facility] MEDICAID.   Per test claim: PA required; However, NEW/RECENT labs/notes are needed to complete & submit PA request. Please see below.  Pt needs an office visit within the last 3 months before a PA can be submitted. I see in a previous note that she was supposed to have an appointment this month, however it appears that she cancelled.

## 2024-03-09 ENCOUNTER — Ambulatory Visit (INDEPENDENT_AMBULATORY_CARE_PROVIDER_SITE_OTHER): Payer: MEDICAID

## 2024-03-09 ENCOUNTER — Ambulatory Visit: Payer: MEDICAID | Admitting: Podiatry

## 2024-03-09 DIAGNOSIS — L97512 Non-pressure chronic ulcer of other part of right foot with fat layer exposed: Secondary | ICD-10-CM

## 2024-03-09 DIAGNOSIS — M2011 Hallux valgus (acquired), right foot: Secondary | ICD-10-CM

## 2024-03-09 NOTE — Progress Notes (Signed)
°  Patient presents follow-up ischemic ulcer hallux right.  Because of Medicare restrictions they were not able to schedule her and for her vascular consult until April 11, 2024.  For some reason has been using vinegar to soak it.  No fever chills nausea or vomiting.   Physical Exam:   Patient alert and oriented x 3.  No complaints of nausea, vomiting, fever, or chills   Vascular: DP pulses 0/4 bilateral. PT pulses 0/4 lateral.  Mild edema. Capillary fill time delayed bilaterally.   Dermatologic: Full thickness ulcer lateral hallux right 35 mm x 25 mm x 3 mm deep.  Thick eschar feels the area of the ulceration.  Area of ulceration and eschar looks about the same.  Overall ulcer borders are slightly larger.  No signs of infection today.  Neurologic:     Musculoskeletal: Hallux abductovalgus deformity right   Radiology: 3 views x-rays foot right: Hallux valgus deformity right.  No subcutaneous emphysema.  Cortical margins are all intact distal and proximal phalanx hallux.  No signs of osteomyelitis.  No significant radiolucencies or densities since previous radiographs.   Diagnoses: 1.  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   Plan: -Established office visit for evaluation and management level 3. - Lightly debrided some  loose superficial tissue from around the ulcer.  Applied antibiotic cream and a light dressing. -Wound care: Continue soaking daily lukewarm Epsom salt water  15 minutes, applying Santyl daily, and a light dressing.  Told her to soak it only in lukewarm Epsom salt water  with no vinegar -Continue surgical shoe right -Unfortunately they cannot get her in at vascular for the consult because of Medicare restrictions until April 11, 2024.    -Referred to wound care clinic for ulcer care.  Could potentially benefit from hyperbaric treatments.  Return 2 weeks f/u ulcer right

## 2024-03-13 ENCOUNTER — Other Ambulatory Visit: Payer: Self-pay

## 2024-03-24 ENCOUNTER — Encounter: Payer: MEDICAID | Admitting: Family

## 2024-03-24 NOTE — Progress Notes (Signed)
   This encounter was created in error - please disregard. No show

## 2024-03-28 NOTE — Progress Notes (Signed)
 " Office Note     CC: Critical limb ischemia Requesting Provider:  [name], Roxan BROCKS, NP  HPI: Latoya Walsh is a 64 y.o. (02-26-1961) female presenting at the request of .[name], [name], NP for critical ischemia with necrotic toe.  Latoya Walsh is well-known to our practice, previously undergone left upper extremity access for end-stage renal disease complicated by steal syndrome, subsequent ligation.  She not interested in further access surgeries.  Patient also has history of stroke, congestive heart failure, chronic hepatitis C.  On exam, Latoya Walsh was doing well.  She stated that the toe wound was unchanged.  Continues to wrap and bandage.  Was supposed to see us  earlier, but stated she had Medicare restrictions that limited her ability to see vascular surgery until January??   She has seen Dr. Maude in the meantime, who has been following the wound. Denies fevers, chills.   The pt is  on a statin for cholesterol management.  The pt is  on a daily aspirin .   Other AC:  -   Past Medical History:  Diagnosis Date   Allergic rhinitis    Anxiety    CHF (congestive heart failure) (HCC)    a. EF 30-35% by echo in 05/2020 b. EF at 45% in 09/2020   Chronic back pain    Chronic hepatitis C without hepatic coma (HCC)    COVID-19 virus infection 12/26/2019   Depression    Diabetes mellitus    Hepatitis C    Hypertension    Noncompliance    Peripheral vascular disease    Poor appetite 07/17/2014   Substance abuse (HCC)    HX of drug use and alcohol use    Past Surgical History:  Procedure Laterality Date   APPENDECTOMY  2004   AV FISTULA PLACEMENT Left 11/26/2020   Procedure: LEFT ARM ARTERIOVENOUS (AV) FISTULA CREATION;  Surgeon: Oris Krystal FALCON, MD;  Location: [facility];  Service: Vascular;  Laterality: Left;   AV FISTULA PLACEMENT Left 06/03/2021   Procedure: INSERTION OF LEFT UPPER ARM ARTERIOVENOUS (AV) GORE-TEX GRAFT;  Surgeon: Oris Krystal FALCON, MD;  Location: [facility];  Service:  Vascular;  Laterality: Left;   EXCHANGE OF A DIALYSIS CATHETER Right 02/24/2022   Procedure: EXCHANGE OF A DIALYSIS CATHETER;  Surgeon: Oris Krystal FALCON, MD;  Location: [facility];  Service: Vascular;  Laterality: Right;   IR FLUORO GUIDE CV LINE RIGHT  12/25/2020   IR US  GUIDE VASC ACCESS RIGHT  12/25/2020   LIGATION ARTERIOVENOUS GORTEX GRAFT Left 08/05/2021   Procedure: LIGATION OF LEFT ARM ARTERIOVENOUS GORTEX GRAFT;  Surgeon: Oris Krystal FALCON, MD;  Location: [facility];  Service: Vascular;  Laterality: Left;    Social History   Socioeconomic History   Marital status: Single    Spouse name: Not on file   Number of children: 3   Years of education: Not on file   Highest education level: Not on file  Occupational History   Occupation: Disabled  Tobacco Use   Smoking status: Former    Current packs/day: 0.25    Types: Cigarettes    Passive exposure: Past   Smokeless tobacco: Never  Vaping Use   Vaping status: Never Used  Substance and Sexual Activity   Alcohol use: Not Currently    Alcohol/week: 40.0 standard drinks of alcohol    Types: 40 Cans of beer per week    Comment: 2 16oz cans of beer a day   Drug use: Not Currently    Types:  Marijuana, Cocaine    Comment: cant rememeber last time used.   Sexual activity: Yes    Birth control/protection: Condom  Other Topics Concern   Not on file  Social History Narrative   Not on file   Social Drivers of Health   Tobacco Use: Medium Risk (02/22/2024)   Patient History    Smoking Tobacco Use: Former    Smokeless Tobacco Use: Never    Passive Exposure: Past  Physicist, Medical Strain: Medium Risk (10/25/2023)   Overall Financial Resource Strain (CARDIA)    Difficulty of Paying Living Expenses: Somewhat hard  Food Insecurity: No Food Insecurity (03/02/2024)   Epic    Worried About Programme Researcher, Broadcasting/film/video in the Last Year: Never true    Ran Out of Food in the Last Year: Never true  Transportation Needs: No Transportation Needs (03/02/2024)    Epic    Lack of Transportation (Medical): No    Lack of Transportation (Non-Medical): No  Recent Concern: Transportation Needs - Unmet Transportation Needs (02/22/2024)   Epic    Lack of Transportation (Medical): Yes    Lack of Transportation (Non-Medical): Yes  Physical Activity: Not on file  Stress: Stress Concern Present (08/21/2021)   [employer] of Occupational Health - Occupational Stress Questionnaire    Feeling of Stress : Rather much  Social Connections: Not on file  Intimate Partner Violence: Not At Risk (03/02/2024)   Epic    Fear of Current or Ex-Partner: No    Emotionally Abused: No    Physically Abused: No    Sexually Abused: No  Depression (PHQ2-9): High Risk (02/22/2024)   Depression (PHQ2-9)    PHQ-2 Score: 17  Alcohol Screen: Not on file  Housing: Unknown (03/02/2024)   Epic    Unable to Pay for Housing in the Last Year: No    Number of Times Moved in the Last Year: Not on file    Homeless in the Last Year: No  Utilities: Not At Risk (03/02/2024)   Epic    Threatened with loss of utilities: No  Health Literacy: Inadequate Health Literacy (07/15/2023)   B1300 Health Literacy    Frequency of need for help with medical instructions: Always   Family History  Problem Relation Age of Onset   Diabetes Sister        Twin - AIDS     Current Outpatient Medications  Medication Sig Dispense Refill   Accu-Chek Softclix Lancets lancets USE TO TEST BLOOD SUGAR TWICE DAILY AS DIRECTED 100 each 11   albuterol  (VENTOLIN  HFA) 108 (90 Base) MCG/ACT inhaler Inhale 2 puffs into the lungs every 4 (four) hours as needed for wheezing or shortness of breath. 18 g 1   amLODipine  (NORVASC ) 10 MG tablet Take 10 mg by mouth every morning.     aspirin  EC 81 MG tablet Take 1 tablet (81 mg total) by mouth daily. Swallow whole. 30 tablet 12   busPIRone  (BUSPAR ) 5 MG tablet TAKE ONE TABLET BY MOUTH TWICE DAILY 60 tablet 2   COMFORT EZ PEN NEEDLES 32G X 4 MM MISC USE AS DIRECTED with  insulin  injections 100 each 1   Continuous Glucose Receiver (FREESTYLE LIBRE 3 READER) DEVI 1 Device by Does not apply route continuous. 1 each 0   Continuous Glucose Sensor (FREESTYLE LIBRE 3 PLUS SENSOR) MISC Change sensor every 15 days. 6 each 3   Continuous Glucose Sensor (FREESTYLE LIBRE 3 SENSOR) MISC 1 Device by Does not apply route continuous. Place 1 sensor on  the skin every 14 days. Use to check glucose continuously 6 each 3   doxycycline  (VIBRAMYCIN ) 100 MG capsule Take 1 capsule (100 mg total) by mouth 2 (two) times daily. 20 capsule 0   insulin  glargine (LANTUS  SOLOSTAR) 100 UNIT/ML Solostar Pen Inject 30 Units into the skin at bedtime. (Patient taking differently: Inject 15 Units into the skin 2 (two) times daily.) 15 mL 5   insulin  lispro (HUMALOG  KWIKPEN) 100 UNIT/ML KwikPen Inject 6 Units into the skin 2 (two) times daily. (Patient taking differently: Inject 8 Units into the skin 2 (two) times daily.) 15 mL 3   Insulin  Pen Needle (PEN NEEDLES) 31G X 5 MM MISC Use to inject insulin  once daily 90 each 3   losartan  (COZAAR ) 100 MG tablet Take 1 tablet (100 mg total) by mouth daily. 90 tablet 1   metoprolol  succinate (TOPROL -XL) 50 MG 24 hr tablet TAKE 1 TABLET BY MOUTH ONCE DAILY *PATIENT NEEDS APPOINTMENT* 7 tablet 0   mirtazapine  (REMERON ) 15 MG tablet TAKE 1 TABLET BY MOUTH EACH EVENING 30 tablet 3   mirtazapine  (REMERON ) 30 MG tablet Take 1 tablet (30 mg total) by mouth at bedtime. 90 tablet 1   mupirocin  ointment (BACTROBAN ) 2 % Apply 1 Application topically 2 (two) times daily. 22 g 2   ondansetron  (ZOFRAN ) 4 MG tablet Take 1 tablet (4 mg total) by mouth every 6 (six) hours. 12 tablet 0   rosuvastatin  (CRESTOR ) 10 MG tablet TAKE 1 TABLET BY MOUTH EVERY MORNING 90 tablet 1   Sevelamer  Carbonate (RENVELA  PO) Take 1 packet by mouth See admin instructions. 1 packet with each meal and with snacks     No current facility-administered medications for this visit.    Allergies   Allergen Reactions   Penicillins Itching and Rash     REVIEW OF SYSTEMS:  [X]  denotes positive finding, [ ]  denotes negative finding Cardiac  Comments:  Chest pain or chest pressure:    Shortness of breath upon exertion:    Short of breath when lying flat:    Irregular heart rhythm:        Vascular    Pain in calf, thigh, or hip brought on by ambulation:    Pain in feet at night that wakes you up from your sleep:     Blood clot in your veins:    Leg swelling:         Pulmonary    Oxygen  at home:    Productive cough:     Wheezing:         Neurologic    Sudden weakness in arms or legs:     Sudden numbness in arms or legs:     Sudden onset of difficulty speaking or slurred speech:    Temporary loss of vision in one eye:     Problems with dizziness:         Gastrointestinal    Blood in stool:     Vomited blood:         Genitourinary    Burning when urinating:     Blood in urine:        Psychiatric    Major depression:         Hematologic    Bleeding problems:    Problems with blood clotting too easily:        Skin    Rashes or ulcers:        Constitutional    Fever or chills:  PHYSICAL EXAMINATION:  There were no vitals filed for this visit.   General:  WDWN in NAD; vital signs documented above Gait: Not observed HENT: WNL, normocephalic Pulmonary: normal non-labored breathing , without wheezing Cardiac: regular HR Abdomen: soft, NT, no masses Skin: without rashes Vascular Exam/Pulses:  Right Left  Radial 2+ (normal) 2+ (normal)  Ulnar    Femoral absent absent  Popliteal    DP absent absent  PT     Extremities: with ischemic changes, with Gangrene , without cellulitis; with open wounds;  Musculoskeletal: no muscle wasting or atrophy  Neurologic: A&O X 3;  No focal weakness or paresthesias are detected Psychiatric:  The pt has Normal affect.   Non-Invasive Vascular Imaging:    ABI Findings:   +---------+------------------+-----+--------------+------------------------  ----+  Right   Rt Pressure (mmHg)IndexWaveform      Comment                        +---------+------------------+-----+--------------+------------------------  ----+  Brachial 187                                  Cuff placed on forearm  due                                                  to pain from scar on  upper                                                  arm                            +---------+------------------+-----+--------------+------------------------  ----+  ATA     255               1.36 monophasic                                   +---------+------------------+-----+--------------+------------------------  ----+  PTA     255               1.36 monophasic                                   +---------+------------------+-----+--------------+------------------------  ----+  Great Toe                       bandaged wound                               +---------+------------------+-----+--------------+------------------------  ----+  2nd toe                         flat                                         +---------+------------------+-----+--------------+------------------------  ----+   +---------+------------------+-----+----------+---------+  Left    Lt Pressure (mmHg)IndexWaveform  Comment    +---------+------------------+-----+----------+---------+  Brachial                                  HD access  +---------+------------------+-----+----------+---------+  ATA     255               1.36 monophasic           +---------+------------------+-----+----------+---------+  PTA     255               1.36 monophasic           +---------+------------------+-----+----------+---------+  Great Toe29                0.16                      +---------+------------------+-----+----------+---------+      ASSESSMENT/PLAN: KRISS PERLEBERG is a 64 y.o. female presenting with critical limb ischemia of the right foot with gangrenous changes to the right first toe.  I had a long discussion with Latoya Walsh regarding the above.  Without intervention, this will result in limb loss.  We discussed right lower extremity angiogram in effort to define improve distal perfusion for wound healing.  At the time, she would likely require left common iliac artery stenting as there is severe stenosis which will likely limit intervention otherwise.  I am very concerned that she has severe atherosclerotic disease throughout the right lower extremity I think intervention will be multilevel involving the superficial femoral artery, popliteal artery, tibial arteries as judged by the CT scan.  After discussing risks and benefits, Latoya Walsh elected to proceed.  She will require admission after her angiogram for definitive management of the right great toe.     Fonda FORBES Rim, MD [facility] [phone]  "

## 2024-03-29 ENCOUNTER — Other Ambulatory Visit: Payer: Self-pay | Admitting: Family

## 2024-03-29 DIAGNOSIS — E1165 Type 2 diabetes mellitus with hyperglycemia: Secondary | ICD-10-CM

## 2024-03-29 DIAGNOSIS — I120 Hypertensive chronic kidney disease with stage 5 chronic kidney disease or end stage renal disease: Secondary | ICD-10-CM

## 2024-03-29 DIAGNOSIS — I5022 Chronic systolic (congestive) heart failure: Secondary | ICD-10-CM

## 2024-03-30 ENCOUNTER — Other Ambulatory Visit: Payer: Self-pay

## 2024-03-30 ENCOUNTER — Ambulatory Visit: Payer: MEDICAID | Attending: Vascular Surgery | Admitting: Vascular Surgery

## 2024-03-30 ENCOUNTER — Encounter: Payer: Self-pay | Admitting: Vascular Surgery

## 2024-03-30 ENCOUNTER — Other Ambulatory Visit: Payer: MEDICAID

## 2024-03-30 VITALS — BP 156/93 | HR 101 | Temp 98.2°F | Resp 22 | Ht 67.0 in | Wt 165.3 lb

## 2024-03-30 DIAGNOSIS — I70235 Atherosclerosis of native arteries of right leg with ulceration of other part of foot: Secondary | ICD-10-CM | POA: Diagnosis present

## 2024-03-30 DIAGNOSIS — N186 End stage renal disease: Secondary | ICD-10-CM | POA: Insufficient documentation

## 2024-03-30 NOTE — Patient Outreach (Signed)
 Complex Care Management   Visit Note  03/30/2024  Name:  Latoya Walsh MRN: [id number] DOB: [birth year]  Situation: Referral received for Complex Care Management related to Critical limb ischemia of right extremity with ulceration of foot, Stroke and Diabetes with Complications, pain and discoloration to right great toe. I obtained verbal consent from Patient.  Visit completed with Patient on the phone.  Background:   Past Medical History:  Diagnosis Date   Allergic rhinitis    Anxiety    CHF (congestive heart failure) (HCC)    a. EF 30-35% by echo in 05/2020 b. EF at 45% in 09/2020   Chronic back pain    Chronic hepatitis C without hepatic coma (HCC)    COVID-19 virus infection 12/26/2019   Depression    Diabetes mellitus    Hepatitis C    Hypertension    Noncompliance    Peripheral vascular disease    Poor appetite 07/17/2014   Substance abuse (HCC)    HX of drug use and alcohol use    Assessment: Patient Reported Symptoms:  Cognitive Cognitive Status: Alert and oriented to person, place, and time, Normal speech and language skills Cognitive/Intellectual Conditions Management [RPT]: None reported or documented in medical history or problem list   Health Maintenance Behaviors: Annual physical exam Health Facilitated by: Rest, Pain control  Neurological Neurological Review of Symptoms: Not assessed    HEENT HEENT Symptoms Reported: Not assessed      Cardiovascular Cardiovascular Symptoms Reported: Swelling in legs or feet Does patient have uncontrolled Hypertension?: No Cardiovascular Management Strategies: Routine screening, Medication therapy, Adequate rest Cardiovascular Self-Management Outcome: 4 (good)  Respiratory Respiratory Symptoms Reported: No symptoms reported    Endocrine Endocrine Symptoms Reported: Not assessed    Gastrointestinal Gastrointestinal Symptoms Reported: Not assessed      Genitourinary Genitourinary Symptoms Reported: No symptoms  reported, Other Genitourinary Management Strategies: Hemodialysis Hemodialysis Schedule: M,W, F Hemodialysis Last Treatment: 03/29/24 Genitourinary Self-Management Outcome: 4 (good)  Integumentary Integumentary Symptoms Reported: Skin changes, Wound Additional Integumentary Details: Critical limb ischemia of right lower extremity with ulceration of foot; wound on right great toe Skin Management Strategies: Adequate rest, Dressing changes, Routine screening, Medication therapy Skin Self-Management Outcome: 3 (uncertain) Skin Comment: patient is scheduled to undergo an adominal aortagram w/lower exremity on 04/05/24  Musculoskeletal Musculoskelatal Symptoms Reviewed: Limited mobility, Unsteady gait, Difficulty walking Additional Musculoskeletal Details: Critical limb ischemia of right lower extremity with ulceration of foot Musculoskeletal Management Strategies: Adequate rest, Medical device, Routine screening Musculoskeletal Self-Management Outcome: 3 (uncertain)      Psychosocial Psychosocial Symptoms Reported: No symptoms reported   Major Change/Loss/Stressor/Fears (CP): Medical condition, self Quality of Family Relationships: supportive, involved, helpful Do you feel physically threatened by others?: No    03/30/2024    PHQ2-9 Depression Screening   Latoya Walsh interest or pleasure in doing things    Feeling down, depressed, or hopeless    PHQ-2 - Total Score    Trouble falling or staying asleep, or sleeping too much    Feeling tired or having Latoya Walsh energy    Poor appetite or overeating     Feeling bad about yourself - or that you are a failure or have let yourself or your family down    Trouble concentrating on things, such as reading the newspaper or watching television    Moving or speaking so slowly that other people could have noticed.  Or the opposite - being so fidgety or restless that you have been moving around a lot  more than usual    Thoughts that you would be better off dead,  or hurting yourself in some way    PHQ2-9 Total Score    If you checked off any problems, how difficult have these problems made it for you to do your work, take care of things at home, or get along with other people    Depression Interventions/Treatment      There were no vitals filed for this visit. Pain Scale: Not given for pain  Medications Reviewed Today     Reviewed by Morgan Clayborne CROME, RN (Registered Nurse) on 03/30/24 at 1204  Med List Status: <None>   Medication Order Taking? Sig Documenting Provider Last Dose Status Informant  Accu-Chek Softclix Lancets lancets [id number] Yes USE TO TEST BLOOD SUGAR TWICE DAILY AS DIRECTED [name], [name], NP  Active   albuterol  (VENTOLIN  HFA) 108 (90 Base) MCG/ACT inhaler [id number] Yes Inhale 2 puffs into the lungs every 4 (four) hours as needed for wheezing or shortness of breath. [name], [name], NP  Active   amLODipine  (NORVASC ) 10 MG tablet [id number] Yes Take 10 mg by mouth every morning. [provider]  Active   aspirin  EC 81 MG tablet [id number]  Take 1 tablet (81 mg total) by mouth daily. Swallow whole.  Patient not taking: Reported on 03/30/2024   Sherryl Bouchard, NP  Active   COMFORT EZ PEN NEEDLES 32G X 4 MM MISC [id number] Yes USE AS DIRECTED with insulin  injections Therisa Benton PARAS, NP  Active Self, Pharmacy Records, Family Member  Continuous Glucose Receiver (FREESTYLE LIBRE 3 READER) DEVI [id number] Yes 1 Device by Does not apply route continuous. [name], [name], NP  Active   Continuous Glucose Sensor (FREESTYLE LIBRE 3 PLUS SENSOR) MISC [id number] Yes Change sensor every 15 days. [name], [name], NP  Active   Continuous Glucose Sensor (FREESTYLE LIBRE 3 SENSOR) [place] [id number] Yes 1 Device by Does not apply route continuous. Place 1 sensor on the skin every 14 days. Use to check glucose continuously Dartha Ernst, MD  Active Self, Pharmacy Records, Family Member           Med Note (ROBB, MELANIE A   Thu Jan 28, 2023  9:09  AM) Has not picked up from the pharmacy  insulin  glargine (LANTUS  SOLOSTAR) 100 UNIT/ML Solostar Pen [id number] Yes Inject 30 Units into the skin at bedtime.  Patient taking differently: Inject 15 Units into the skin 2 (two) times daily.   [name], [name], NP  Active   insulin  lispro (HUMALOG  KWIKPEN) 100 UNIT/ML KwikPen [id number] Yes Inject 6 Units into the skin 2 (two) times daily.  Patient taking differently: Inject 8 Units into the skin 2 (two) times daily.   [name], [name], NP  Active   Insulin  Pen Needle (PEN NEEDLES) 31G X 5 MM MISC [id number] Yes Use to inject insulin  once daily Therisa Benton PARAS, NP  Active Self, Pharmacy Records, Family Member  mirtazapine  (REMERON ) 15 MG tablet [id number] Yes TAKE 1 TABLET BY MOUTH EACH EVENING [name], [name], NP  Active   mupirocin  ointment (BACTROBAN ) 2 % [id number] Yes Apply 1 Application topically 2 (two) times daily. Christine Rush, DPM  Active   ondansetron  (ZOFRAN ) 4 MG tablet [id number] Yes Take 1 tablet (4 mg total) by mouth every 6 (six) hours. Cleotilde Rogue, MD  Active   rosuvastatin  (CRESTOR ) 10 MG tablet [id number] Yes TAKE 1 TABLET BY MOUTH EVERY MORNING [name], [name], NP  Active   Sevelamer  Carbonate (  RENVELA  PO) [id number] Yes Take 1 packet by mouth See admin instructions. 1 packet with each meal and with snacks [provider]  Active Self, Pharmacy Records, Family Member           Med Note (CRUTHIS, CHLOE C   Tue Mar 02, 2023 10:52 AM) Pt is unsure of the dose of her packets. No fill hx found.   Med List Note Gaylon Charmaine NOVAK, [place] 97/71/82 8392):              Recommendation:   Specialty provider follow-up  Surgery Date: 04/05/24 Surgeon: DOROTHA CHARLENA Rim Please report to the Admitting Department at [facility] Entrance A at 9:00 AM.  Follow Up Plan:   Telephone follow up appointment date/time  04/13/2024 Status: Sch   Time: 1:00 PM Length: 30  Visit Type: VBCI TELEPHONE CALL 30 [2502] Copay:  $0.00  Provider: Morgan Clayborne CROME, RN Department: [facility] HEALTH   Clayborne Morgan RN BSN CCM [place]  [facility], [facility] Health Nurse Care Coordinator  Direct Dial : [phone] Website: [email or web address]

## 2024-03-30 NOTE — Patient Instructions (Signed)
 Visit Information  Thank you for taking time to visit with me today. Please don't hesitate to contact me if I can be of assistance to you before our next scheduled appointment.  Your next care management appointment is by telephone on Thursday, January 22 at 1:00 PM  Please call the care guide team at [phone] if you need to cancel, schedule, or reschedule an appointment.   Please call [phone]-TALK (toll free, 24 hour hotline) if you are experiencing a Mental Health or Behavioral Health Crisis or need someone to talk to.  Clayborne Ly RN BSN CCM [place]  [facility], [facility] Health Nurse Care Coordinator  Direct Dial : [phone] Website: [email or web address]

## 2024-04-05 ENCOUNTER — Encounter (HOSPITAL_COMMUNITY): Admission: RE | Payer: Self-pay | Source: Home / Self Care

## 2024-04-05 ENCOUNTER — Ambulatory Visit (HOSPITAL_COMMUNITY): Admission: RE | Admit: 2024-04-05 | Payer: MEDICAID | Source: Home / Self Care | Admitting: Vascular Surgery

## 2024-04-05 SURGERY — ABDOMINAL AORTOGRAM W/LOWER EXTREMITY
Anesthesia: LOCAL

## 2024-04-06 ENCOUNTER — Ambulatory Visit: Payer: MEDICAID | Admitting: "Endocrinology

## 2024-04-06 ENCOUNTER — Other Ambulatory Visit: Payer: Self-pay | Admitting: Family

## 2024-04-06 DIAGNOSIS — F411 Generalized anxiety disorder: Secondary | ICD-10-CM

## 2024-04-10 ENCOUNTER — Other Ambulatory Visit: Payer: Self-pay | Admitting: Family

## 2024-04-10 DIAGNOSIS — F411 Generalized anxiety disorder: Secondary | ICD-10-CM

## 2024-04-11 ENCOUNTER — Ambulatory Visit: Payer: MEDICAID | Admitting: Podiatry

## 2024-04-11 DIAGNOSIS — M79671 Pain in right foot: Secondary | ICD-10-CM

## 2024-04-11 DIAGNOSIS — M79672 Pain in left foot: Secondary | ICD-10-CM

## 2024-04-11 DIAGNOSIS — B351 Tinea unguium: Secondary | ICD-10-CM | POA: Diagnosis not present

## 2024-04-11 NOTE — Progress Notes (Signed)
 Patient presents for evaluation and treatment of tenderness and some redness around nails feet.  Tenderness around toes with walking and wearing shoes.  She had an appointment with wound care clinic however she has some transportation issues so she has to reschedule that.  No fever chills nausea or vomiting.  Physical exam:  General appearance: Alert, pleasant, and in no acute distress.  Vascular: Pedal pulses: DP 0/4 B/L, PT 0/4 B/L. Mild edema lower legs bilaterally.  Capillary refill time immediate bilaterally  Neurologic:  Dermatologic:  Nails thickened, disfigured, discolored 1-5 BL with subungual debris.  Redness and hypertrophic nail folds along nail folds bilaterally but no signs of drainage or infection.  Hallux right is stable.  Is becoming somewhat mummified at this point.  Very little drainage.  Musculoskeletal:     Diagnosis: 1. Painful onychomycotic nails 1 through 5 bilaterally. 2. Pain toes 1 through 5 bilaterally.  Plan: -Debrided onychomycotic nails 1 through 5 bilaterally.  Sharply debrided nails with nail clipper and reduced with a power bur.  -Make sure she keeps her rescheduled appointment for wound care clinic for the hallux right.  I explained to her today that there is a good chance that the toe will need to be amputated. Return 3 months [facility]

## 2024-04-13 ENCOUNTER — Other Ambulatory Visit: Payer: MEDICAID

## 2024-04-13 ENCOUNTER — Encounter (HOSPITAL_BASED_OUTPATIENT_CLINIC_OR_DEPARTMENT_OTHER): Payer: MEDICAID | Attending: Internal Medicine | Admitting: Internal Medicine

## 2024-04-13 DIAGNOSIS — I70235 Atherosclerosis of native arteries of right leg with ulceration of other part of foot: Secondary | ICD-10-CM | POA: Diagnosis not present

## 2024-04-13 DIAGNOSIS — Z992 Dependence on renal dialysis: Secondary | ICD-10-CM | POA: Diagnosis not present

## 2024-04-13 DIAGNOSIS — N186 End stage renal disease: Secondary | ICD-10-CM | POA: Diagnosis not present

## 2024-04-13 DIAGNOSIS — I509 Heart failure, unspecified: Secondary | ICD-10-CM | POA: Diagnosis not present

## 2024-04-13 DIAGNOSIS — Z87891 Personal history of nicotine dependence: Secondary | ICD-10-CM | POA: Diagnosis not present

## 2024-04-13 DIAGNOSIS — Z794 Long term (current) use of insulin: Secondary | ICD-10-CM | POA: Diagnosis not present

## 2024-04-13 DIAGNOSIS — L97518 Non-pressure chronic ulcer of other part of right foot with other specified severity: Secondary | ICD-10-CM | POA: Diagnosis not present

## 2024-04-13 DIAGNOSIS — E11621 Type 2 diabetes mellitus with foot ulcer: Secondary | ICD-10-CM | POA: Insufficient documentation

## 2024-04-13 NOTE — Patient Instructions (Signed)
 Latoya Walsh - I am sorry I was unable to reach you today for our scheduled appointment.  Your next care management appointment is by telephone on Tuesday, February 10 at 12:00 PM  Please call the care guide team at [phone] if you need to cancel, schedule, or reschedule an appointment.   Please call [phone]-TALK (toll free, 24 hour hotline) if you are experiencing a Mental Health or Behavioral Health Crisis or need someone to talk to.   Thank you,  Clayborne Ly RN BSN CCM [place]  [facility], [facility] Health Nurse Care Coordinator  Direct Dial : [phone] Website: [email or web address]

## 2024-04-21 ENCOUNTER — Telehealth: Payer: Self-pay | Admitting: Family

## 2024-04-21 NOTE — Telephone Encounter (Signed)
 Patient sister Latoya Walsh came in and dropped off 2 forms to be completed and faxed to numbers provided one form for [facility] and the other for [employer]. Given to [facility] in [place].

## 2024-04-24 ENCOUNTER — Other Ambulatory Visit (HOSPITAL_COMMUNITY): Payer: Self-pay

## 2024-04-25 NOTE — Telephone Encounter (Signed)
 Latoya Walsh is calling and wanted to give the Correct fax # for [facility] [phone]  Please advise

## 2024-04-25 NOTE — Telephone Encounter (Signed)
 Will forward message to [facility] staff.

## 2024-04-25 NOTE — Telephone Encounter (Signed)
 Noted.

## 2024-04-26 ENCOUNTER — Ambulatory Visit: Payer: MEDICAID | Admitting: "Endocrinology

## 2024-04-27 ENCOUNTER — Encounter (HOSPITAL_BASED_OUTPATIENT_CLINIC_OR_DEPARTMENT_OTHER): Payer: MEDICAID | Admitting: Internal Medicine

## 2024-04-28 ENCOUNTER — Other Ambulatory Visit: Payer: Self-pay

## 2024-04-28 DIAGNOSIS — I70235 Atherosclerosis of native arteries of right leg with ulceration of other part of foot: Secondary | ICD-10-CM

## 2024-05-02 ENCOUNTER — Telehealth: Payer: MEDICAID

## 2024-05-04 ENCOUNTER — Encounter (HOSPITAL_BASED_OUTPATIENT_CLINIC_OR_DEPARTMENT_OTHER): Payer: MEDICAID | Admitting: Internal Medicine

## 2024-05-10 ENCOUNTER — Encounter (HOSPITAL_COMMUNITY): Payer: Self-pay

## 2024-05-10 ENCOUNTER — Ambulatory Visit (HOSPITAL_COMMUNITY): Admit: 2024-05-10 | Payer: MEDICAID | Admitting: Vascular Surgery

## 2024-05-11 ENCOUNTER — Ambulatory Visit: Payer: MEDICAID | Admitting: "Endocrinology

## 2024-07-11 ENCOUNTER — Ambulatory Visit: Payer: MEDICAID | Admitting: Podiatry
